# Patient Record
Sex: Male | Born: 1960 | Race: White | Hispanic: No | Marital: Married | Smoking: Former smoker
Health system: Southern US, Community
[De-identification: ages and names within clinical notes are randomized; demographics above are authoritative.]

## PROBLEM LIST (undated history)

## (undated) DIAGNOSIS — G43109 Migraine with aura, not intractable, without status migrainosus: Secondary | ICD-10-CM

## (undated) DIAGNOSIS — R269 Unspecified abnormalities of gait and mobility: Secondary | ICD-10-CM

## (undated) DIAGNOSIS — J301 Allergic rhinitis due to pollen: Secondary | ICD-10-CM

## (undated) DIAGNOSIS — J45909 Unspecified asthma, uncomplicated: Secondary | ICD-10-CM

## (undated) DIAGNOSIS — G4489 Other headache syndrome: Secondary | ICD-10-CM

## (undated) DIAGNOSIS — G35D Multiple sclerosis, unspecified: Secondary | ICD-10-CM

## (undated) DIAGNOSIS — G500 Trigeminal neuralgia: Secondary | ICD-10-CM

## (undated) DIAGNOSIS — F482 Pseudobulbar affect: Secondary | ICD-10-CM

## (undated) DIAGNOSIS — R1310 Dysphagia, unspecified: Secondary | ICD-10-CM

## (undated) DIAGNOSIS — F329 Major depressive disorder, single episode, unspecified: Secondary | ICD-10-CM

## (undated) DIAGNOSIS — G35 Multiple sclerosis: Secondary | ICD-10-CM

## (undated) DIAGNOSIS — F32A Depression, unspecified: Secondary | ICD-10-CM

## (undated) HISTORY — PX: OTHER SURGICAL HISTORY: SHX169

## (undated) HISTORY — DX: Pseudobulbar affect: F48.2

## (undated) HISTORY — DX: Major depressive disorder, single episode, unspecified: F32.9

## (undated) HISTORY — DX: Unspecified abnormalities of gait and mobility: R26.9

## (undated) HISTORY — DX: Dysphagia, unspecified: R13.10

## (undated) HISTORY — DX: Migraine with aura, not intractable, without status migrainosus: G43.109

## (undated) HISTORY — DX: Unspecified asthma, uncomplicated: J45.909

## (undated) HISTORY — DX: Multiple sclerosis, unspecified: G35.D

## (undated) HISTORY — DX: Allergic rhinitis due to pollen: J30.1

## (undated) HISTORY — PX: EYE SURGERY: SHX253

## (undated) HISTORY — DX: Multiple sclerosis: G35

## (undated) HISTORY — DX: Other headache syndrome: G44.89

## (undated) HISTORY — DX: Depression, unspecified: F32.A

---

## 2006-01-10 ENCOUNTER — Encounter: Admission: RE | Admit: 2006-01-10 | Discharge: 2006-01-10 | Payer: Self-pay | Admitting: Internal Medicine

## 2009-05-17 ENCOUNTER — Encounter: Admission: RE | Admit: 2009-05-17 | Discharge: 2009-05-17 | Payer: Self-pay | Admitting: Family Medicine

## 2009-12-10 ENCOUNTER — Encounter: Admission: RE | Admit: 2009-12-10 | Discharge: 2010-03-10 | Payer: Self-pay | Admitting: Neurology

## 2010-04-25 ENCOUNTER — Encounter: Admission: RE | Admit: 2010-04-25 | Discharge: 2010-05-08 | Payer: Self-pay | Admitting: Neurology

## 2011-01-15 ENCOUNTER — Encounter (INDEPENDENT_AMBULATORY_CARE_PROVIDER_SITE_OTHER): Payer: Medicare Other

## 2011-01-15 DIAGNOSIS — G35 Multiple sclerosis: Secondary | ICD-10-CM

## 2011-12-24 DIAGNOSIS — R0989 Other specified symptoms and signs involving the circulatory and respiratory systems: Secondary | ICD-10-CM | POA: Diagnosis not present

## 2011-12-24 DIAGNOSIS — M549 Dorsalgia, unspecified: Secondary | ICD-10-CM | POA: Diagnosis not present

## 2012-01-19 DIAGNOSIS — G35 Multiple sclerosis: Secondary | ICD-10-CM | POA: Diagnosis not present

## 2012-01-19 DIAGNOSIS — S2249XA Multiple fractures of ribs, unspecified side, initial encounter for closed fracture: Secondary | ICD-10-CM | POA: Diagnosis not present

## 2012-02-26 DIAGNOSIS — Z9181 History of falling: Secondary | ICD-10-CM | POA: Diagnosis not present

## 2012-02-26 DIAGNOSIS — G35 Multiple sclerosis: Secondary | ICD-10-CM | POA: Diagnosis not present

## 2012-02-26 DIAGNOSIS — IMO0001 Reserved for inherently not codable concepts without codable children: Secondary | ICD-10-CM | POA: Diagnosis not present

## 2012-02-26 DIAGNOSIS — G8220 Paraplegia, unspecified: Secondary | ICD-10-CM | POA: Diagnosis not present

## 2012-02-26 DIAGNOSIS — R269 Unspecified abnormalities of gait and mobility: Secondary | ICD-10-CM | POA: Diagnosis not present

## 2012-03-01 DIAGNOSIS — G35 Multiple sclerosis: Secondary | ICD-10-CM | POA: Diagnosis not present

## 2012-03-01 DIAGNOSIS — G8220 Paraplegia, unspecified: Secondary | ICD-10-CM | POA: Diagnosis not present

## 2012-03-01 DIAGNOSIS — R269 Unspecified abnormalities of gait and mobility: Secondary | ICD-10-CM | POA: Diagnosis not present

## 2012-03-01 DIAGNOSIS — Z9181 History of falling: Secondary | ICD-10-CM | POA: Diagnosis not present

## 2012-03-01 DIAGNOSIS — IMO0001 Reserved for inherently not codable concepts without codable children: Secondary | ICD-10-CM | POA: Diagnosis not present

## 2012-03-08 DIAGNOSIS — R269 Unspecified abnormalities of gait and mobility: Secondary | ICD-10-CM | POA: Diagnosis not present

## 2012-03-08 DIAGNOSIS — Z9181 History of falling: Secondary | ICD-10-CM | POA: Diagnosis not present

## 2012-03-08 DIAGNOSIS — IMO0001 Reserved for inherently not codable concepts without codable children: Secondary | ICD-10-CM | POA: Diagnosis not present

## 2012-03-08 DIAGNOSIS — G35 Multiple sclerosis: Secondary | ICD-10-CM | POA: Diagnosis not present

## 2012-03-08 DIAGNOSIS — G8220 Paraplegia, unspecified: Secondary | ICD-10-CM | POA: Diagnosis not present

## 2012-03-10 DIAGNOSIS — G8220 Paraplegia, unspecified: Secondary | ICD-10-CM | POA: Diagnosis not present

## 2012-03-10 DIAGNOSIS — G35 Multiple sclerosis: Secondary | ICD-10-CM | POA: Diagnosis not present

## 2012-03-10 DIAGNOSIS — IMO0001 Reserved for inherently not codable concepts without codable children: Secondary | ICD-10-CM | POA: Diagnosis not present

## 2012-03-10 DIAGNOSIS — Z9181 History of falling: Secondary | ICD-10-CM | POA: Diagnosis not present

## 2012-03-10 DIAGNOSIS — R269 Unspecified abnormalities of gait and mobility: Secondary | ICD-10-CM | POA: Diagnosis not present

## 2012-03-17 DIAGNOSIS — R269 Unspecified abnormalities of gait and mobility: Secondary | ICD-10-CM | POA: Diagnosis not present

## 2012-03-17 DIAGNOSIS — G35 Multiple sclerosis: Secondary | ICD-10-CM | POA: Diagnosis not present

## 2012-03-17 DIAGNOSIS — G8220 Paraplegia, unspecified: Secondary | ICD-10-CM | POA: Diagnosis not present

## 2012-03-17 DIAGNOSIS — IMO0001 Reserved for inherently not codable concepts without codable children: Secondary | ICD-10-CM | POA: Diagnosis not present

## 2012-03-17 DIAGNOSIS — Z9181 History of falling: Secondary | ICD-10-CM | POA: Diagnosis not present

## 2012-05-20 DIAGNOSIS — R269 Unspecified abnormalities of gait and mobility: Secondary | ICD-10-CM | POA: Diagnosis not present

## 2012-05-20 DIAGNOSIS — M25519 Pain in unspecified shoulder: Secondary | ICD-10-CM | POA: Diagnosis not present

## 2012-05-20 DIAGNOSIS — G35 Multiple sclerosis: Secondary | ICD-10-CM | POA: Diagnosis not present

## 2012-08-04 ENCOUNTER — Ambulatory Visit (INDEPENDENT_AMBULATORY_CARE_PROVIDER_SITE_OTHER): Payer: Medicare Other | Admitting: Family Medicine

## 2012-08-04 ENCOUNTER — Encounter: Payer: Self-pay | Admitting: Family Medicine

## 2012-08-04 VITALS — BP 98/68 | HR 68 | Temp 98.4°F | Ht 70.0 in | Wt 175.0 lb

## 2012-08-04 DIAGNOSIS — E785 Hyperlipidemia, unspecified: Secondary | ICD-10-CM

## 2012-08-04 DIAGNOSIS — Z131 Encounter for screening for diabetes mellitus: Secondary | ICD-10-CM

## 2012-08-04 DIAGNOSIS — G35C1 Active secondary progressive multiple sclerosis: Secondary | ICD-10-CM | POA: Insufficient documentation

## 2012-08-04 DIAGNOSIS — G35 Multiple sclerosis: Secondary | ICD-10-CM | POA: Insufficient documentation

## 2012-08-04 DIAGNOSIS — R7309 Other abnormal glucose: Secondary | ICD-10-CM | POA: Diagnosis not present

## 2012-08-04 DIAGNOSIS — Z5181 Encounter for therapeutic drug level monitoring: Secondary | ICD-10-CM

## 2012-08-04 DIAGNOSIS — Z13220 Encounter for screening for lipoid disorders: Secondary | ICD-10-CM

## 2012-08-04 DIAGNOSIS — Z130 Encounter for screening for diseases of the blood and blood-forming organs and certain disorders involving the immune mechanism: Secondary | ICD-10-CM

## 2012-08-04 DIAGNOSIS — R748 Abnormal levels of other serum enzymes: Secondary | ICD-10-CM

## 2012-08-04 DIAGNOSIS — R739 Hyperglycemia, unspecified: Secondary | ICD-10-CM

## 2012-08-04 NOTE — Progress Notes (Signed)
Chief Complaint  Patient presents with  . Establish Care    HPI: [name] is here to establish care. Has never had a primary care doctor. Has been followed by a neurologist for this. Has the following concerns today: none   Other Providers: -Dr. Sandria Manly in neurology, followed closely  [facility] eye care  Flu vaccine: -can't remember if had tetanus vaccine -does not get flu vaccine  ROS: See pertinent positives and negatives per HPI.  Past Medical History  Diagnosis Date  . Depression   . Hay fever   . MS (multiple sclerosis)   . Asthma     childhood asthma    Family History  Problem Relation Age of Onset  . Lung cancer      parent  . Uterine cancer      other    History   Social History  . Marital Status: Married    Spouse Name: N/A    Number of Children: N/A  . Years of Education: N/A   Social History Main Topics  . Smoking status: Former Games developer  . Smokeless tobacco: None  . Alcohol Use: Yes     rare   . Drug Use: None  . Sexually Active: None   Other Topics Concern  . None   Social History Narrative  . None    Current outpatient prescriptions:citalopram (CELEXA) 20 MG tablet, Take 20 mg by mouth daily., Disp: , Rfl: ;  Fingolimod HCl (GILENYA) 0.5 MG CAPS, Take by mouth daily., Disp: , Rfl: ;  naproxen sodium (ANAPROX) 220 MG tablet, Take 220 mg by mouth 2 (two) times daily with a meal., Disp: , Rfl: ;  tiZANidine (ZANAFLEX) 4 MG capsule, Take 4 mg by mouth daily., Disp: , Rfl:   EXAM:  Filed Vitals:   08/04/12 1421  BP: 98/68  Pulse: 68  Temp: 98.4 F (36.9 C)    Body mass index is 25.11 kg/(m^2).  GENERAL: vitals reviewed and listed above, alert, oriented, appears well hydrated and in no acute distress  MS: in a wheelchair  PSYCH: pleasant and cooperative, no obvious depression or anxiety  ASSESSMENT AND PLAN:  Discussed the following assessment and plan:  1. MS (multiple sclerosis), followed by Dr. Sandria Manly in neurology    2.  Diabetes mellitus screening    3. Screening for hyperlipidemia  Lipid Panel, Hemoglobin A1c  4. Medication monitoring encounter    5. Screening for other and unspecified deficiency anemia    6. Abnormal liver enzymes    7. Hyperglycemia  Hemoglobin A1c  8. Hyperlipidemia  Lipid Panel   -We reviewed the PMH, PSH, FH, SH, Meds and Allergies. - provided refills for any medications we will prescribe as needed. - addressed current concerns per orders and patient instructions. - have asked for records for pertinent exams, studies, vaccines and notes from previous providers. - have advised patient to follow up per instructions below.  Patient Instructions  -We have ordered labs or studies at this visit. It usually takes 1-2 weeks for results and processing. We will contact you with instructions IF your results are abnormal. Normal results will be released to your [facility] in 1-2 weeks. If you have not heard from [place] or can not find your results in [facility] in 2 weeks please contact our office.  Thank you for enrolling in MyChart. Please follow the instructions below to securely access your online medical record. MyChart allows you to send messages to your doctor, view your test results, renew your  prescriptions, schedule appointments, and more.  How Do I Sign Up? 1. In your Internet browser, go to [email or web address] 2. Click on the New  User? link in the Sign In box.  3. Enter your MyChart Access Code exactly as it appears below. You will not need to use this code after you have completed the sign-up process. If you do not sign up before the expiration date, you must request a new code. MyChart Access Code: [email or web address] Expires: 09/03/2012  2:55 PM  4. Enter the last four digits of your Social Security Number (xxxx) and Date of Birth (mm/dd/yyyy) as indicated and click Next. You will be taken to the next sign-up page. 5. Create a MyChart ID. This will be your MyChart login ID  and cannot be changed, so think of one that is secure and easy to remember. 6. Create a MyChart password. You can change your password at any time. 7. Enter your Password Reset Question and Answer and click Next. This can be used at a later time if you forget your password.  8. Select your communication preference, and if applicable enter your e-mail address. You will receive e-mail notification when new information is available in MyChart by choosing to receive e-mail notifications and filling in your e-mail. 9. Click Sign In. You can now view your medical record.   Additional Information If you have questions, you can email [email or web address] or call [phone] to talk to our MyChart staff. Remember, MyChart is NOT to be used for urgent needs. For medical emergencies, dial 911.            Kriste Basque R.

## 2012-08-04 NOTE — Patient Instructions (Signed)
-  We have ordered labs or studies at this visit. It usually takes 1-2 weeks for results and processing. We will contact you with instructions IF your results are abnormal. Normal results will be released to your [facility] in 1-2 weeks. If you have not heard from [place] or can not find your results in [facility] in 2 weeks please contact our office.  Thank you for enrolling in MyChart. Please follow the instructions below to securely access your online medical record. MyChart allows you to send messages to your doctor, view your test results, renew your prescriptions, schedule appointments, and more.  How Do I Sign Up? 1. In your Internet browser, go to [email or web address] 2. Click on the New  User? link in the Sign In box.  3. Enter your MyChart Access Code exactly as it appears below. You will not need to use this code after you have completed the sign-up process. If you do not sign up before the expiration date, you must request a new code. MyChart Access Code: [email or web address] Expires: 09/03/2012  2:55 PM  4. Enter the last four digits of your Social Security Number (xxxx) and Date of Birth (mm/dd/yyyy) as indicated and click Next. You will be taken to the next sign-up page. 5. Create a MyChart ID. This will be your MyChart login ID and cannot be changed, so think of one that is secure and easy to remember. 6. Create a MyChart password. You can change your password at any time. 7. Enter your Password Reset Question and Answer and click Next. This can be used at a later time if you forget your password.  8. Select your communication preference, and if applicable enter your e-mail address. You will receive e-mail notification when new information is available in MyChart by choosing to receive e-mail notifications and filling in your e-mail. 9. Click Sign In. You can now view your medical record.   Additional Information If you have questions, you can email [email or web address] or call [phone] to talk to our MyChart staff. Remember, MyChart is NOT to be used for urgent needs. For medical emergencies, dial 911.

## 2012-08-05 ENCOUNTER — Telehealth: Payer: Self-pay | Admitting: Family Medicine

## 2012-08-05 LAB — LIPID PANEL
Cholesterol: 267 mg/dL — ABNORMAL HIGH (ref 0–200)
Total CHOL/HDL Ratio: 6
Triglycerides: 171 mg/dL — ABNORMAL HIGH (ref 0.0–149.0)

## 2012-08-05 LAB — LDL CHOLESTEROL, DIRECT: Direct LDL: 192.1 mg/dL

## 2012-08-05 NOTE — Telephone Encounter (Signed)
Please let [name] know - we wanted to contact him about his labs since [facility] not activiated. Diabetes screening test was negative. Cholesterol is very high. Would advised a follow up appointment in a month or two to address the cholesterol and discuss treatment options. If he could come for an early appointment and not eat that day we could check his cholesterol that same day.

## 2012-08-05 NOTE — Telephone Encounter (Signed)
Attempted to call pt and number is busy. Will attempt to call at later time.

## 2012-08-08 NOTE — Telephone Encounter (Signed)
Called and spoke with pt and pt is aware.  

## 2012-09-19 DIAGNOSIS — G5710 Meralgia paresthetica, unspecified lower limb: Secondary | ICD-10-CM | POA: Diagnosis not present

## 2012-09-19 DIAGNOSIS — G35 Multiple sclerosis: Secondary | ICD-10-CM | POA: Diagnosis not present

## 2012-09-19 DIAGNOSIS — M25519 Pain in unspecified shoulder: Secondary | ICD-10-CM | POA: Diagnosis not present

## 2012-09-21 ENCOUNTER — Encounter: Payer: Self-pay | Admitting: Family Medicine

## 2012-09-21 DIAGNOSIS — N319 Neuromuscular dysfunction of bladder, unspecified: Secondary | ICD-10-CM | POA: Insufficient documentation

## 2012-09-21 DIAGNOSIS — G5710 Meralgia paresthetica, unspecified lower limb: Secondary | ICD-10-CM

## 2012-09-21 NOTE — Progress Notes (Signed)
Received and reviewed office note from [facility] Neurology - Dr. Sandria Manly Seen for MS, Meralgia Paresthetica, neurogenic bladder - increased LE weakness. Zanaflex increased to 2mg  tid. Gabapentin 100mg  tid for MP started. Gilenya and celexa continued. I updated the medication and problem list.

## 2012-11-26 ENCOUNTER — Other Ambulatory Visit: Payer: Self-pay

## 2013-01-04 ENCOUNTER — Ambulatory Visit: Payer: Self-pay | Admitting: Neurology

## 2013-01-10 ENCOUNTER — Ambulatory Visit (INDEPENDENT_AMBULATORY_CARE_PROVIDER_SITE_OTHER): Payer: Medicare Other | Admitting: Neurology

## 2013-01-10 ENCOUNTER — Encounter: Payer: Self-pay | Admitting: Neurology

## 2013-01-10 VITALS — BP 103/69 | HR 62 | Ht 70.0 in | Wt 180.0 lb

## 2013-01-10 DIAGNOSIS — G35 Multiple sclerosis: Secondary | ICD-10-CM

## 2013-01-10 DIAGNOSIS — G5710 Meralgia paresthetica, unspecified lower limb: Secondary | ICD-10-CM | POA: Diagnosis not present

## 2013-01-10 DIAGNOSIS — N319 Neuromuscular dysfunction of bladder, unspecified: Secondary | ICD-10-CM

## 2013-01-10 DIAGNOSIS — Z5181 Encounter for therapeutic drug level monitoring: Secondary | ICD-10-CM | POA: Diagnosis not present

## 2013-01-10 DIAGNOSIS — G5712 Meralgia paresthetica, left lower limb: Secondary | ICD-10-CM

## 2013-01-10 NOTE — Progress Notes (Signed)
Reason for visit: Multiple sclerosis  SOSTENES [name] is an 52 y.o. male  History of present illness:  [name] is a 52 year old right-handed white male with a history of multiple sclerosis that was initially diagnosed in 2001. Even at age 73, the patient recalls tingly sensations in the legs after exercise. The patient initially presented with lower extremity weakness and gait imbalance. Over time, the patient has had a progressive paraparesis, and ataxia affecting the upper extremities. The patient is minimally ambulatory at this time, and he can only walk with a walker and with assistance. The patient does have some problems with a neurogenic bowel and bladder. The patient has had falls on occasion, the last fall was one week ago. The patient lives with his wife and 2 daughters. The patient has had a left meralgia paresthetica that has been improved with the use of gabapentin. The patient will occasionally have cramps in the legs, and he uses tizanidine for this which is beneficial. The patient is on Gilenya, and he is tolerating the medication well. Since last seen, the patient is having more ataxia with the use of the left upper extremity. The patient returns to this office for an evaluation. The last MRI of the brain or spinal cord was in 2009. The last blood work was in the summer of 2013. The alkaline phosphatase was slightly elevated at that time.  Past Medical History  Diagnosis Date  . Depression   . Hay fever   . MS (multiple sclerosis)   . Asthma     childhood asthma    Past Surgical History  Procedure Laterality Date  . Eye surgeries      x 2; bilateral 74 and 73    Family History  Problem Relation Age of Onset  . Lung cancer      parent  . Uterine cancer      other  . Cancer Father   . Multiple sclerosis Sister   . Seizures Maternal Uncle   . Multiple sclerosis Sister     Social history:  reports that he has quit smoking. He does not have any smokeless  tobacco history on file. He reports that  drinks alcohol. His drug history is not on file.  Allergies: No Known Allergies  Medications:  Current Outpatient Prescriptions on File Prior to Visit  Medication Sig Dispense Refill  . citalopram (CELEXA) 20 MG tablet Take 20 mg by mouth daily.      . Fingolimod HCl (GILENYA) 0.5 MG CAPS Take by mouth daily.      Marland Kitchen gabapentin (NEURONTIN) 100 MG capsule Take 100 mg by mouth 3 (three) times daily.      . naproxen sodium (ANAPROX) 220 MG tablet Take 220 mg by mouth 2 (two) times daily with a meal.       No current facility-administered medications on file prior to visit.    ROS:  Out of a complete 14 system review of symptoms, the patient complains only of the following symptoms, and all other reviewed systems are negative.  Fatigue Dizziness Incontinence of bowel and bladder, constipation Joint pain, achy muscles Memory loss, confusion, headache, numbness, weakness Depression  Blood pressure 103/69, pulse 62, height 5\' 10"  (1.778 m), weight 180 lb (81.647 kg).  Physical Exam  General: The patient is alert and cooperative at the time of the examination.  Skin: No significant peripheral edema is noted.   Neurologic Exam  Cranial nerves: Facial symmetry is present. Speech is slightly dysarthric, not  aphasic Extraocular movements are full. The patient has a left internuclear ophthalmoplegia. Visual fields are full.  Motor: The patient has good strength in the upper extremities extremities. With the lower extremities, there is diffuse 4/5 strength, with a footdrop seen on the right greater than left foot. Increased motor tone is noted in the legs.  Coordination: The patient has dysmetria with finger-nose-finger bilaterally, left greater than right. The patient is unable to perform heel-to-shin with the lower extremities.  Gait and station: The patient is able to stand up with assistance. The patient was not ambulated. Romberg is  negative.  Reflexes: Deep tendon reflexes are symmetric, but reflexes are brisk in the lower extremities. Sustained ankle clonus is seen bilaterally.   Assessment/Plan:  One. Multiple sclerosis  2. Gait disorder  3. Neurogenic bowel and bladder  The patient is on Gilenya, and we will recheck blood work at this time to follow the liver function tests. The patient may have a secondary progressive course of multiple sclerosis unfortunately. The patient has significant dysmetria of the left greater than right upper extremities, and he may require weights on the wrists to help function of the arms. The patient has significant limitation in his ability to ambulate. The patient is having brief episodes of dizziness associated with chronic use of Celexa. The patient will followup through this office in about 6 months. We will consider MRI evaluation at that time of the brain and cervical spinal cord.  [name] MD 01/10/2013 11:36 AM  [facility] [address] [place], [place] [address]  Phone [phone] Fax [phone]

## 2013-01-11 ENCOUNTER — Telehealth: Payer: Self-pay | Admitting: Neurology

## 2013-01-11 ENCOUNTER — Ambulatory Visit: Payer: Self-pay | Admitting: Neurology

## 2013-01-11 LAB — CBC WITH DIFFERENTIAL/PLATELET
Basophils Absolute: 0 10*3/uL (ref 0.0–0.2)
Basos: 1 % (ref 0–3)
Eos: 1 % (ref 0–5)
Hemoglobin: 14.4 g/dL (ref 12.6–17.7)
MCH: 30.3 pg (ref 26.6–33.0)
MCHC: 34.1 g/dL (ref 31.5–35.7)
MCV: 89 fL (ref 79–97)
Monocytes Absolute: 0.4 10*3/uL (ref 0.1–0.9)
Neutrophils Absolute: 2.3 10*3/uL (ref 1.4–7.0)
Neutrophils Relative %: 72 % (ref 40–74)
RBC: 4.75 x10E6/uL (ref 4.14–5.80)
RDW: 13.9 % (ref 12.3–15.4)

## 2013-01-11 LAB — COMPREHENSIVE METABOLIC PANEL
ALT: 50 IU/L — ABNORMAL HIGH (ref 0–44)
Albumin: 4.5 g/dL (ref 3.5–5.5)
BUN: 15 mg/dL (ref 6–24)
Calcium: 9.4 mg/dL (ref 8.7–10.2)
Creatinine, Ser: 0.84 mg/dL (ref 0.76–1.27)
Globulin, Total: 2.1 g/dL (ref 1.5–4.5)
Glucose: 79 mg/dL (ref 65–99)
Potassium: 4.5 mmol/L (ref 3.5–5.2)
Sodium: 144 mmol/L (ref 134–144)

## 2013-01-11 NOTE — Telephone Encounter (Signed)
I called patient. The blood work reveals elevations in the upper velocities at 155, and the ALT at 50. The phosphatase was minimally elevated in the summer of 2013. I will call the patient about 3 months to recheck the liver enzymes white blood count was 3.1, which is expected on Gilenya. I discussed this with patient.

## 2013-02-07 ENCOUNTER — Encounter: Payer: Self-pay | Admitting: Neurology

## 2013-04-04 ENCOUNTER — Telehealth: Payer: Self-pay | Admitting: Neurology

## 2013-04-06 DIAGNOSIS — Z0289 Encounter for other administrative examinations: Secondary | ICD-10-CM

## 2013-04-12 ENCOUNTER — Telehealth: Payer: Self-pay | Admitting: Neurology

## 2013-04-12 DIAGNOSIS — Z5181 Encounter for therapeutic drug level monitoring: Secondary | ICD-10-CM

## 2013-04-12 NOTE — Telephone Encounter (Signed)
Message copied by Stephanie Acre on Wed Apr 12, 2013  7:30 AM ------      Message from: Stephanie Acre      Created: Wed Jan 11, 2013  7:58 AM       Recheck liver enzymes. The patient is on Gilenya. ------

## 2013-04-12 NOTE — Telephone Encounter (Signed)
I called the patient. He is to come in for blood work to recheck the liver function tests, the patient is on Gilenya.

## 2013-04-13 ENCOUNTER — Other Ambulatory Visit: Payer: Self-pay | Admitting: Neurology

## 2013-04-13 ENCOUNTER — Telehealth: Payer: Self-pay | Admitting: Neurology

## 2013-04-13 DIAGNOSIS — Z5181 Encounter for therapeutic drug level monitoring: Secondary | ICD-10-CM | POA: Diagnosis not present

## 2013-04-13 LAB — COMPREHENSIVE METABOLIC PANEL
ALT: 58 IU/L — ABNORMAL HIGH (ref 0–44)
Albumin/Globulin Ratio: 2.7 — ABNORMAL HIGH (ref 1.1–2.5)
BUN/Creatinine Ratio: 16 (ref 9–20)
Chloride: 104 mmol/L (ref 96–108)
Creatinine, Ser: 0.79 mg/dL (ref 0.76–1.27)
GFR calc Af Amer: 122 mL/min/{1.73_m2} (ref >59)
GFR calc non Af Amer: 105 mL/min/{1.73_m2} (ref >59)
Globulin, Total: 1.8 g/dL (ref 1.5–4.5)
Sodium: 140 mmol/L (ref 134–144)
Total Bilirubin: 0.6 mg/dL (ref 0.0–1.2)

## 2013-04-13 NOTE — Telephone Encounter (Signed)
I called the patient. The blood work revealed a stable mild elevation in the alkaline phosphatase and ALT compared to 3 months ago.

## 2013-05-05 ENCOUNTER — Telehealth: Payer: Self-pay

## 2013-05-05 NOTE — Telephone Encounter (Signed)
I called and left VM that forms have been completed and signed. They will be faxed now and a copy with confirmation will be sent out to patient tonight.

## 2013-05-17 ENCOUNTER — Other Ambulatory Visit: Payer: Self-pay

## 2013-05-17 MED ORDER — CITALOPRAM HYDROBROMIDE 20 MG PO TABS: 20.0000 mg | ORAL_TABLET | Freq: Every day | ORAL | Status: DC

## 2013-07-12 ENCOUNTER — Ambulatory Visit (INDEPENDENT_AMBULATORY_CARE_PROVIDER_SITE_OTHER): Payer: Medicare Other | Admitting: Neurology

## 2013-07-12 ENCOUNTER — Encounter: Payer: Self-pay | Admitting: Neurology

## 2013-07-12 VITALS — BP 111/71 | HR 65 | Wt 183.0 lb

## 2013-07-12 DIAGNOSIS — N319 Neuromuscular dysfunction of bladder, unspecified: Secondary | ICD-10-CM | POA: Diagnosis not present

## 2013-07-12 DIAGNOSIS — G35 Multiple sclerosis: Secondary | ICD-10-CM | POA: Diagnosis not present

## 2013-07-12 NOTE — Progress Notes (Signed)
Reason for visit: Multiple sclerosis  [name] is an 52 y.o. male  History of present illness:   [name] is a 52 year old right-handed white male with a history of multiple sclerosis associated with significant ataxia of the left greater than right upper extremity, and a gait disorder with spasticity of the lower extremities. The patient is on Gilenya, and he is tolerating the medication well. The patient has had episodes of squeezing, uncomfortable sensations involving the left arm that will come and go, oftentimes lasting about 3 days before resolution. The patient also has occasional stinging sensations in the left thigh that will improve with the use of low-dose gabapentin. The patient has also noted intermittent twitching of the muscles of the lower face on the left, associated with a "thumping" sensation in the left ear. The twitching in the face muscles and a thumping sensations are in phase. The patient is essentially nonambulatory, he can stand for transfers. The patient has chronic problems with constipation, and he uses enemas on average 3 times a week. The patient returns for an evaluation.  Past Medical History  Diagnosis Date  . Depression   . Hay fever   . MS (multiple sclerosis)   . Asthma     childhood asthma    Past Surgical History  Procedure Laterality Date  . Eye surgeries      x 2; bilateral 74 and 34    Family History  Problem Relation Age of Onset  . Lung cancer      parent  . Uterine cancer      other  . Cancer Father   . Multiple sclerosis Sister   . Seizures Maternal Uncle   . Parkinsonism Maternal Uncle   . Multiple sclerosis Sister   . Multiple sclerosis Paternal Uncle   . Multiple sclerosis Other     Social history:  reports that he has quit smoking. He has never used smokeless tobacco. He reports that  drinks alcohol. He reports that he does not use illicit drugs.   No Known Allergies  Medications:  Current Outpatient  Prescriptions on File Prior to Visit  Medication Sig Dispense Refill  . citalopram (CELEXA) 20 MG tablet Take 1 tablet (20 mg total) by mouth daily.  90 tablet  1  . Fingolimod HCl (GILENYA) 0.5 MG CAPS Take by mouth daily.      Marland Kitchen gabapentin (NEURONTIN) 100 MG capsule Take 100 mg by mouth 3 (three) times daily.      . naproxen sodium (ANAPROX) 220 MG tablet Take 220 mg by mouth 2 (two) times daily with a meal.      . tiZANidine (ZANAFLEX) 2 MG tablet Take 2 mg by mouth 3 (three) times daily as needed.       No current facility-administered medications on file prior to visit.    ROS:  Out of a complete 14 system review of symptoms, the patient complains only of the following symptoms, and all other reviewed systems are negative.  Fatigue Eye pain Incontinence of bowel and bladder Impotence Chronic constipation Achy muscles Confusion, headache, numbness, weakness, dizziness Depression, decreased energy  Blood pressure 111/71, pulse 65, weight 183 lb (83.008 kg).  Physical Exam  General: The patient is alert and cooperative at the time of the examination.  Skin: No significant peripheral edema is noted.   Neurologic Exam  Cranial nerves: Facial symmetry is present. Speech is notable for a cerebellar type speech pattern. Extraocular movements are full. The patient has a  left INO, with lateral nystagmus of the right eye with lateral gaze. Visual fields are full.  Motor: The patient has good strength in the upper extremities. The patient has 4/5 strength of the lower extremities, left leg is slightly weaker than the right. Increased motor tone is noted in the lower extremities.  Coordination: The patient has dysmetria with both upper extremities with finger-nose-finger, much worse on the left than the right. The patient not able to perform heel-to-shin on either side very well.  Gait and station: The patient is nonambulatory, wheelchair-bound.  Reflexes: Deep tendon reflexes are  symmetric, but are brisk in the legs..   Assessment/Plan:  1. Multiple sclerosis  2. Gait disorder  The patient reports some left facial fasciculations associated with a "thumping" sensation of the left ear likely associated with involvement of the left facial nerve centrally. The patient is having some discomfort in the left arm, left leg, and he may need to go on gabapentin and tizanidine on a regular basis. He is currently taking these medications if needed. The patient will be given a trial Linzess for his chronic constipation. If this is effective, he will contact our office. The patient will continue the Gilenya, and a repeat MRI of the brain will be done at this time. The patient will followup in 6 months.  Mark Palau MD 07/12/2013 4:59 PM  [facility] [address] [place], [place] [address]  Phone [phone] Fax [phone]

## 2013-07-24 ENCOUNTER — Ambulatory Visit
Admission: RE | Admit: 2013-07-24 | Discharge: 2013-07-24 | Disposition: A | Discharge status: Home / Self Care | Payer: Medicare Other | Source: Ambulatory Visit | Attending: Neurology | Admitting: Neurology

## 2013-07-24 ENCOUNTER — Telehealth: Payer: Self-pay | Admitting: Neurology

## 2013-07-24 DIAGNOSIS — N319 Neuromuscular dysfunction of bladder, unspecified: Secondary | ICD-10-CM

## 2013-07-24 DIAGNOSIS — G35 Multiple sclerosis: Secondary | ICD-10-CM

## 2013-07-24 MED ORDER — GADOBENATE DIMEGLUMINE 529 MG/ML IV SOLN
17.0000 mL | Freq: Once | INTRAVENOUS | Status: AC | PRN
  Administered 2013-07-24: 17 mL via INTRAVENOUS

## 2013-07-24 MED ORDER — LINACLOTIDE 290 MCG PO CAPS: 290.0000 ug | ORAL_CAPSULE | Freq: Every day | ORAL | Status: DC

## 2013-07-24 NOTE — Telephone Encounter (Signed)
I called patient. The MRI study the brain shows some progression of white matter changes, not severe. The patient does have some progression of atrophy as well suggesting axonal loss, chronic disease. I discussed this with the patient. He will remain on Gilenya. The patient has found that the Linzess is effective for his constipation. I will call in a prescription.

## 2013-08-17 ENCOUNTER — Other Ambulatory Visit: Payer: Self-pay

## 2013-09-12 ENCOUNTER — Telehealth: Payer: Self-pay | Admitting: Neurology

## 2013-09-12 NOTE — Telephone Encounter (Signed)
I called patient. The patient is trying to get some sort of connection of the diagnosis of MS with a service connection through the [facility]. The patient was initially evaluated in 2001 by Dr. Sandria Manly, and a MRI of some sort was done, probably the brain. A definite diagnosis was not made until 2006. The patient presented with left-sided numbness. The patient likely had MS at that time, I have dictated a letter to this regard. I'll try to get the paper medical records and review the studies that were done in 2001. I'll need to dictate another letter more than likely.

## 2013-09-12 NOTE — Telephone Encounter (Signed)
Prior Dr. Sandria Manly patient, please advise.

## 2013-09-15 ENCOUNTER — Encounter: Payer: Self-pay | Admitting: Neurology

## 2013-09-15 ENCOUNTER — Telehealth: Payer: Self-pay | Admitting: *Deleted

## 2013-09-15 NOTE — Telephone Encounter (Signed)
INFORMED PATIENT THAT LETTER IS AVAILABLE FOR P/U. HIS WIFE WILL PICK IT UP FOR HIM TODAY.

## 2013-11-02 ENCOUNTER — Other Ambulatory Visit: Payer: Self-pay

## 2013-11-02 ENCOUNTER — Encounter: Payer: Self-pay | Admitting: Family Medicine

## 2013-11-02 ENCOUNTER — Ambulatory Visit (INDEPENDENT_AMBULATORY_CARE_PROVIDER_SITE_OTHER): Payer: Medicare Other | Admitting: Family Medicine

## 2013-11-02 VITALS — BP 120/80 | Temp 97.8°F | Wt 185.0 lb

## 2013-11-02 DIAGNOSIS — N319 Neuromuscular dysfunction of bladder, unspecified: Secondary | ICD-10-CM

## 2013-11-02 DIAGNOSIS — G35 Multiple sclerosis: Secondary | ICD-10-CM | POA: Diagnosis not present

## 2013-11-02 DIAGNOSIS — R300 Dysuria: Secondary | ICD-10-CM | POA: Diagnosis not present

## 2013-11-02 LAB — POCT URINALYSIS DIPSTICK
Blood, UA: NEGATIVE
Glucose, UA: NEGATIVE
LEUKOCYTES UA: NEGATIVE
Nitrite, UA: NEGATIVE
Protein, UA: NEGATIVE
SPEC GRAV UA: 1.025
Urobilinogen, UA: 4
pH, UA: 5.5

## 2013-11-02 MED ORDER — FINGOLIMOD HCL 0.5 MG PO CAPS: 0.5000 mg | ORAL_CAPSULE | Freq: Every day | ORAL | Status: DC

## 2013-11-02 NOTE — Progress Notes (Signed)
Pre visit review using our clinic review tool, if applicable. No additional management support is needed unless otherwise documented below in the visit note. 

## 2013-11-02 NOTE — Addendum Note (Signed)
Addended by: Serena ColonelHRASHER, Alanni Vader R on: 11/02/2013 08:43 AM   Modules accepted: Orders

## 2013-11-02 NOTE — Progress Notes (Signed)
Chief Complaint  Patient presents with  . Abdominal Pain    HPI:  Acute visit for Dysuria: -chronic suprapupic discomfort prior to voiding related to neurogenic bladder, but a little worse the last 1 week and wanted to check for infection -lower abd/bladder spasm discomfort right before voiding, urgency, hesitancy -denies:fevers, vomiting, change in bowels, hematuria, incontinence, flank pain  ROS: See pertinent positives and negatives per HPI.  Past Medical History  Diagnosis Date  . Depression   . Hay fever   . MS (multiple sclerosis)   . Asthma     childhood asthma    Past Surgical History  Procedure Laterality Date  . Eye surgeries      x 2; bilateral 74 and 58    Family History  Problem Relation Age of Onset  . Lung cancer      parent  . Uterine cancer      other  . Cancer Father   . Multiple sclerosis Sister   . Seizures Maternal Uncle   . Parkinsonism Maternal Uncle   . Multiple sclerosis Sister   . Multiple sclerosis Paternal Uncle   . Multiple sclerosis Other     History   Social History  . Marital Status: Married    Spouse Name: N/A    Number of Children: N/A  . Years of Education: N/A   Social History Main Topics  . Smoking status: Former Games developer  . Smokeless tobacco: Never Used  . Alcohol Use: Yes     Comment: rare   . Drug Use: No  . Sexual Activity: None   Other Topics Concern  . None   Social History Narrative  . None    Current outpatient prescriptions:citalopram (CELEXA) 20 MG tablet, Take 1 tablet (20 mg total) by mouth daily., Disp: 90 tablet, Rfl: 1;  Fingolimod HCl (GILENYA) 0.5 MG CAPS, Take by mouth daily., Disp: , Rfl: ;  gabapentin (NEURONTIN) 100 MG capsule, Take 100 mg by mouth 3 (three) times daily., Disp: , Rfl: ;  Linaclotide (LINZESS) 290 MCG CAPS capsule, Take 1 capsule (290 mcg total) by mouth daily., Disp: 30 capsule, Rfl: 5 naproxen sodium (ANAPROX) 220 MG tablet, Take 220 mg by mouth 2 (two) times daily with a  meal., Disp: , Rfl: ;  tiZANidine (ZANAFLEX) 2 MG tablet, Take 2 mg by mouth 3 (three) times daily as needed., Disp: , Rfl:   EXAM:  Filed Vitals:   11/02/13 0815  BP: 120/80  Temp: 97.8 F (36.6 C)    Body mass index is 26.54 kg/(m^2).  GENERAL: vitals reviewed and listed above, alert, oriented, appears well hydrated and in no acute distress  HEENT: atraumatic, conjunttiva clear, no obvious abnormalities on inspection of external nose and ears  NECK: no obvious masses on inspection  LUNGS: clear to auscultation bilaterally, no wheezes, rales or rhonchi, good air movement  CV: HRRR, no peripheral edema  ABD: BS+, soft, NTTP, no CVA TTP  MS: moves all extremities without noticeable abnormality  PSYCH: pleasant and cooperative, no obvious depression or anxiety  ASSESSMENT AND PLAN:  Discussed the following assessment and plan:  Neurogenic bladder  MS (multiple sclerosis), followed by Dr. Sandria Manly in neurology  Dysuria - Plan: Culture, Urine  -we discussed possible serious and likely etiologies, workup and treatment, treatment risks and return precautions -after this discussion, Pierre opted for urine culture/dip today to exclude acute symptomatic infection with tx with cipro if needed. If no infection will see urologist, also considering seeing urologist regardless given  chronic voiding issues and reports was supposed to do this 3 years ago. He will let us know if desires this referral. -follow up advised as needed -of course, we advised Kanen  to return or notify a doctor immediately if symptoms worsen or persist or new concerns arise.  -Patient advised to return or notify a doctor immediately if symptoms worsen or persist or new concerns arise.  There are no Patient Instructions on file for this visit.   Kriste BasqueKIM, HANNAH R.

## 2013-11-04 LAB — URINE CULTURE
COLONY COUNT: NO GROWTH
ORGANISM ID, BACTERIA: NO GROWTH

## 2013-11-06 NOTE — Addendum Note (Signed)
Addended by: Azucena Freed on: 11/06/2013 11:38 AM   Modules accepted: Orders

## 2013-11-10 ENCOUNTER — Other Ambulatory Visit: Payer: Self-pay | Admitting: Neurology

## 2013-11-14 ENCOUNTER — Other Ambulatory Visit: Payer: Self-pay

## 2013-11-14 MED ORDER — TIZANIDINE HCL 2 MG PO TABS: 2.0000 mg | ORAL_TABLET | Freq: Three times a day (TID) | ORAL | Status: DC | PRN

## 2013-11-15 ENCOUNTER — Telehealth: Payer: Self-pay | Admitting: Family Medicine

## 2013-11-15 ENCOUNTER — Encounter: Payer: Self-pay | Admitting: Family Medicine

## 2013-11-15 ENCOUNTER — Telehealth: Payer: Self-pay | Admitting: Neurology

## 2013-11-15 ENCOUNTER — Ambulatory Visit (INDEPENDENT_AMBULATORY_CARE_PROVIDER_SITE_OTHER): Payer: Medicare Other | Admitting: Family Medicine

## 2013-11-15 VITALS — BP 110/80 | Temp 98.3°F

## 2013-11-15 DIAGNOSIS — B029 Zoster without complications: Secondary | ICD-10-CM | POA: Diagnosis not present

## 2013-11-15 MED ORDER — VALACYCLOVIR HCL 1 G PO TABS
1000.0000 mg | ORAL_TABLET | Freq: Three times a day (TID) | ORAL | Status: DC
Start: 2013-11-15 — End: 2014-01-16

## 2013-11-15 NOTE — Telephone Encounter (Signed)
PT THINKS HE HAS SHINGLES--SHOULD HE DISCONTINUE GILENYA

## 2013-11-15 NOTE — Telephone Encounter (Signed)
Patient Information:  Caller Name: Tallyn  Phone: [phone]  Patient: [name], [name]  Gender: Male  DOB: 01-23-1961  Age: 53 Years  PCP: Selena Batten (TEXT 1st, after 20 mins can call), Dahlia Client [facility])  Office Follow Up:  Does the office need to follow up with this patient?: No  Instructions For The Office: N/A   Symptoms  Reason For Call & Symptoms: 11/08/13 pt has MS (confined to wheelchair), pt took a shower and afterwards became nauseous and lightheaded, felt like he was going to pass out.  Episode lasted about 10 minutes.    Has continued to be nauseous since, continuous headache, chills at times.  11/12/13 rash appeared on back.  11/14/13 rash became painful.    Pt has called his neurologist due to one of side effects of his MS medication is that it can cause shingles.   Pt believes the rash is most likely Shingles.  11/15/13 rash extrends from center of back to left side, was two bands but now one large band, red bumps, appears like small blisters forming.  Taking Aleve for pain.  Reviewed Health History In EMR: Yes  Reviewed Medications In EMR: Yes  Reviewed Allergies In EMR: Yes  Reviewed Surgeries / Procedures: Yes  Date of Onset of Symptoms: 11/12/2013  Treatments Tried: Aleve  Treatments Tried Worked: No  Guideline(s) Used:  Rash or Redness - Localized  Disposition Per Guideline:   See Today in Office  Reason For Disposition Reached:   Painful rash and has multiple small blisters grouped together in one area of body (i.e., dermatomal distribution or "band" or "stripe")  Advice Given:  N/A  Patient Will Follow Care Advice:  YES  Appointment Scheduled:  11/15/2013 14:00:00 Appointment Scheduled Provider:  Selena Batten (TEXT 1st, after 20 mins can call), Dahlia Client [facility])

## 2013-11-15 NOTE — Telephone Encounter (Signed)
I called patient. The patient again having a thoracic shingles outbreak within the last 4 days. The patient is on Gilenya. I have asked her to stop the medication for one week, and get started back on it again. The patient is on Valtrex and prednisone. If the rash of significant worsens, he is to contact me.

## 2013-11-15 NOTE — Progress Notes (Signed)
Chief Complaint  Patient presents with  . Rash    on back    HPI:  Acute visit for Rash: -on left side of back - painful rash -had some nausea and headache last week - not today - but has headaches like this all the time with his MS so this is not different and he did notify his neurologist -reports his neurologist wanted him to call their office if it was shingles for instructions regarding his MS medication -denies: fevers,other symptoms today and feels his normal baseline today, he does report his MS has been worse recently   ROS: See pertinent positives and negatives per HPI.  Past Medical History  Diagnosis Date  . Depression   . Hay fever   . MS (multiple sclerosis)   . Asthma     childhood asthma    Past Surgical History  Procedure Laterality Date  . Eye surgeries      x 2; bilateral 74 and 589    Family History  Problem Relation Age of Onset  . Lung cancer      parent  . Uterine cancer      other  . Cancer Father   . Multiple sclerosis Sister   . Seizures Maternal Uncle   . Parkinsonism Maternal Uncle   . Multiple sclerosis Sister   . Multiple sclerosis Paternal Uncle   . Multiple sclerosis Other     History   Social History  . Marital Status: Married    Spouse Name: N/A    Number of Children: N/A  . Years of Education: N/A   Social History Main Topics  . Smoking status: Former Games developermoker  . Smokeless tobacco: Never Used  . Alcohol Use: Yes     Comment: rare   . Drug Use: No  . Sexual Activity: None   Other Topics Concern  . None   Social History Narrative  . None    Current outpatient prescriptions:citalopram (CELEXA) 20 MG tablet, TAKE 1 TABLET BY MOUTH EVERY DAY, Disp: 90 tablet, Rfl: 1;  Fingolimod HCl (GILENYA) 0.5 MG CAPS, Take 1 capsule (0.5 mg total) by mouth daily., Disp: 30 capsule, Rfl: 3;  gabapentin (NEURONTIN) 100 MG capsule, Take 100 mg by mouth 3 (three) times daily., Disp: , Rfl:  Linaclotide (LINZESS) 290 MCG CAPS capsule,  Take 1 capsule (290 mcg total) by mouth daily., Disp: 30 capsule, Rfl: 5;  naproxen sodium (ANAPROX) 220 MG tablet, Take 220 mg by mouth 2 (two) times daily with a meal., Disp: , Rfl: ;  tiZANidine (ZANAFLEX) 2 MG tablet, Take 1 tablet (2 mg total) by mouth 3 (three) times daily as needed., Disp: 90 tablet, Rfl: 6 valACYclovir (VALTREX) 1000 MG tablet, Take 1 tablet (1,000 mg total) by mouth 3 (three) times daily., Disp: 21 tablet, Rfl: 0  EXAM:  Filed Vitals:   11/15/13 1359  BP: 110/80  Temp: 98.3 F (36.8 C)    Body mass index is 0.00 kg/(m^2).  GENERAL: vitals reviewed and listed above, alert, oriented, appears well hydrated and in no acute distress  HEENT: atraumatic, conjunttiva clear, no obvious abnormalities on inspection of external nose and ears  SKIN: shingles rash on L side  CV: HRRR, no peripheral edema  MS: moves all extremities without noticeable abnormality  PSYCH: pleasant and cooperative, no obvious depression or anxiety  ASSESSMENT AND PLAN:  Discussed the following assessment and plan:  Shingles - Plan: valACYclovir (VALTREX) 1000 MG tablet  -discussed risks -advised to notify his neurologist -  Patient advised to return or notify a doctor immediately if symptoms worsen or persist or new concerns arise.  Patient Instructions  -call your neurologist about the shingles and the other issues  -take the valtrex as prescribed starting today  Shingles Shingles (herpes zoster) is an infection that is caused by the same virus that causes chickenpox (varicella). The infection causes a painful skin rash and fluid-filled blisters, which eventually break open, crust over, and heal. It may occur in any area of the body, but it usually affects only one side of the body or face. The pain of shingles usually lasts about 1 month. However, some people with shingles may develop long-term (chronic) pain in the affected area of the body. Shingles often occurs many years after  the person had chickenpox. It is more common:  In people older than 50 years.  In people with weakened immune systems, such as those with HIV, AIDS, or cancer.  In people taking medicines that weaken the immune system, such as transplant medicines.  In people under great stress. CAUSES  Shingles is caused by the varicella zoster virus (VZV), which also causes chickenpox. After a person is infected with the virus, it can remain in the person's body for years in an inactive state (dormant). To cause shingles, the virus reactivates and breaks out as an infection in a nerve root. The virus can be spread from person to person (contagious) through contact with open blisters of the shingles rash. It will only spread to people who have not had chickenpox. When these people are exposed to the virus, they may develop chickenpox. They will not develop shingles. Once the blisters scab over, the person is no longer contagious and cannot spread the virus to others. SYMPTOMS  Shingles shows up in stages. The initial symptoms may be pain, itching, and tingling in an area of the skin. This pain is usually described as burning, stabbing, or throbbing.In a few days or weeks, a painful red rash will appear in the area where the pain, itching, and tingling were felt. The rash is usually on one side of the body in a band or belt-like pattern. Then, the rash usually turns into fluid-filled blisters. They will scab over and dry up in approximately 2 3 weeks. Flu-like symptoms may also occur with the initial symptoms, the rash, or the blisters. These may include:  Fever.  Chills.  Headache.  Upset stomach. DIAGNOSIS  Your caregiver will perform a skin exam to diagnose shingles. Skin scrapings or fluid samples may also be taken from the blisters. This sample will be examined under a microscope or sent to a lab for further testing. TREATMENT  There is no specific cure for shingles. Your caregiver will likely  prescribe medicines to help you manage the pain, recover faster, and avoid long-term problems. This may include antiviral drugs, anti-inflammatory drugs, and pain medicines. HOME CARE INSTRUCTIONS   Take a cool bath or apply cool compresses to the area of the rash or blisters as directed. This may help with the pain and itching.   Only take over-the-counter or prescription medicines as directed by your caregiver.   Rest as directed by your caregiver.  Keep your rash and blisters clean with mild soap and cool water or as directed by your caregiver.  Do not pick your blisters or scratch your rash. Apply an anti-itch cream or numbing creams to the affected area as directed by your caregiver.  Keep your shingles rash covered with a loose bandage (  dressing).  Avoid skin contact with:  Babies.   Pregnant women.   Children with eczema.   Elderly people with transplants.   People with chronic illnesses, such as leukemia or AIDS.   Wear loose-fitting clothing to help ease the pain of material rubbing against the rash.  Keep all follow-up appointments with your caregiver.If the area involved is on your face, you may receive a referral for follow-up to a specialist, such as an eye doctor (ophthalmologist) or an ear, nose, and throat (ENT) doctor. Keeping all follow-up appointments will help you avoid eye complications, chronic pain, or disability.  SEEK IMMEDIATE MEDICAL CARE IF:   You have facial pain, pain around the eye area, or loss of feeling on one side of your face.  You have ear pain or ringing in your ear.  You have loss of taste.  Your pain is not relieved with prescribed medicines.   Your redness or swelling spreads.   You have more pain and swelling.  Your condition is worsening or has changed.   You have a feveror persistent symptoms for more than 2 3 days.  You have a fever and your symptoms suddenly get worse. MAKE SURE YOU:  Understand these  instructions.  Will watch your condition.  Will get help right away if you are not doing well or get worse. Document Released: 09/28/2005 Document Revised: 06/22/2012 Document Reviewed: 05/12/2012 [facility] Patient Information 2014 [place], [place]ona Kettle, Dahlia Client R.

## 2013-11-15 NOTE — Telephone Encounter (Signed)
Patient said that she has noticeable painful rash, has lasted 3 days,has gotten bigger and she has headaches. It was itchy first but now is painful. One of the Gilenya side effects are shingles, should she discontinue?

## 2013-11-15 NOTE — Patient Instructions (Addendum)
-call your neurologist about the shingles and the other issues  -take the valtrex as prescribed starting today  Shingles Shingles (herpes zoster) is an infection that is caused by the same virus that causes chickenpox (varicella). The infection causes a painful skin rash and fluid-filled blisters, which eventually break open, crust over, and heal. It may occur in any area of the body, but it usually affects only one side of the body or face. The pain of shingles usually lasts about 1 month. However, some people with shingles may develop long-term (chronic) pain in the affected area of the body. Shingles often occurs many years after the person had chickenpox. It is more common:  In people older than 50 years.  In people with weakened immune systems, such as those with HIV, AIDS, or cancer.  In people taking medicines that weaken the immune system, such as transplant medicines.  In people under great stress. CAUSES  Shingles is caused by the varicella zoster virus (VZV), which also causes chickenpox. After a person is infected with the virus, it can remain in the person's body for years in an inactive state (dormant). To cause shingles, the virus reactivates and breaks out as an infection in a nerve root. The virus can be spread from person to person (contagious) through contact with open blisters of the shingles rash. It will only spread to people who have not had chickenpox. When these people are exposed to the virus, they may develop chickenpox. They will not develop shingles. Once the blisters scab over, the person is no longer contagious and cannot spread the virus to others. SYMPTOMS  Shingles shows up in stages. The initial symptoms may be pain, itching, and tingling in an area of the skin. This pain is usually described as burning, stabbing, or throbbing.In a few days or weeks, a painful red rash will appear in the area where the pain, itching, and tingling were felt. The rash is usually  on one side of the body in a band or belt-like pattern. Then, the rash usually turns into fluid-filled blisters. They will scab over and dry up in approximately 2 3 weeks. Flu-like symptoms may also occur with the initial symptoms, the rash, or the blisters. These may include:  Fever.  Chills.  Headache.  Upset stomach. DIAGNOSIS  Your caregiver will perform a skin exam to diagnose shingles. Skin scrapings or fluid samples may also be taken from the blisters. This sample will be examined under a microscope or sent to a lab for further testing. TREATMENT  There is no specific cure for shingles. Your caregiver will likely prescribe medicines to help you manage the pain, recover faster, and avoid long-term problems. This may include antiviral drugs, anti-inflammatory drugs, and pain medicines. HOME CARE INSTRUCTIONS   Take a cool bath or apply cool compresses to the area of the rash or blisters as directed. This may help with the pain and itching.   Only take over-the-counter or prescription medicines as directed by your caregiver.   Rest as directed by your caregiver.  Keep your rash and blisters clean with mild soap and cool water or as directed by your caregiver.  Do not pick your blisters or scratch your rash. Apply an anti-itch cream or numbing creams to the affected area as directed by your caregiver.  Keep your shingles rash covered with a loose bandage (dressing).  Avoid skin contact with:  Babies.   Pregnant women.   Children with eczema.   Elderly people with transplants.  People with chronic illnesses, such as leukemia or AIDS.   Wear loose-fitting clothing to help ease the pain of material rubbing against the rash.  Keep all follow-up appointments with your caregiver.If the area involved is on your face, you may receive a referral for follow-up to a specialist, such as an eye doctor (ophthalmologist) or an ear, nose, and throat (ENT) doctor. Keeping all  follow-up appointments will help you avoid eye complications, chronic pain, or disability.  SEEK IMMEDIATE MEDICAL CARE IF:   You have facial pain, pain around the eye area, or loss of feeling on one side of your face.  You have ear pain or ringing in your ear.  You have loss of taste.  Your pain is not relieved with prescribed medicines.   Your redness or swelling spreads.   You have more pain and swelling.  Your condition is worsening or has changed.   You have a feveror persistent symptoms for more than 2 3 days.  You have a fever and your symptoms suddenly get worse. MAKE SURE YOU:  Understand these instructions.  Will watch your condition.  Will get help right away if you are not doing well or get worse. Document Released: 09/28/2005 Document Revised: 06/22/2012 Document Reviewed: 05/12/2012 [facility] Patient Information 2014 [place], [place].

## 2013-11-15 NOTE — Progress Notes (Signed)
Pre visit review using our clinic review tool, if applicable. No additional management support is needed unless otherwise documented below in the visit note. 

## 2013-11-21 ENCOUNTER — Telehealth: Payer: Self-pay | Admitting: Neurology

## 2013-11-21 ENCOUNTER — Telehealth: Payer: Self-pay | Admitting: Family Medicine

## 2013-11-21 DIAGNOSIS — Z5181 Encounter for therapeutic drug level monitoring: Secondary | ICD-10-CM

## 2013-11-21 NOTE — Telephone Encounter (Signed)
The antiviral does not cure the shingles, it usually helps decreased the length of the shingles but folks still often have the shingles for several weeks or more. 7 days of treatment is the usual treatment regardless, but he should notify his neurologist to see if in his case anything different is recommended.

## 2013-11-21 NOTE — Telephone Encounter (Signed)
I called patient. The patient is still having some issues with the shingles rash. The patient has also had what sound like slight increase in spasticity associated with a shingles. This may be related to the inflammatory stress of the shingles and the MS. The patient may delay going back on Gilenya for 10 days, and then go on Gilenya 1 tablet every other day for 2 weeks, and then get back on a daily. I'll have the patient come in for a CBC.

## 2013-11-21 NOTE — Telephone Encounter (Signed)
Patient Information:  Caller Name: [name]  Phone: [phone]  Patient: [name], [name]  Gender: Male  DOB: [birth year]  Age: 53 Years  PCP: Selena BattenKim (TEXT 1st, after 20 mins can call), [facility]  Office Follow Up:  Does the office need to follow up with this patient?: No  Instructions For The Office: N/A  RN Note:  Gave information about shingles, Symptoms, treatments from [facility].  Patient verbalizes understanding.  Symptoms  Reason For Call & Symptoms: Has MS.  Had chills then discomfort on Left side of body in belt like area from mid back around side to mid abdomen.  Was Dx with shingles, has been on antiviral 6 days and is not cured.  Rash band with red blisters/bumps now.  Had to stop Rx for MS while on anti-viral.   Asking what to expect.  Reviewed Health History In EMR: Yes  Reviewed Medications In EMR: Yes  Reviewed Allergies In EMR: Yes  Reviewed Surgeries / Procedures: Yes  Date of Onset of Symptoms: 11/08/2013  Guideline(s) Used:  No Protocol Available - Sick Adult  Disposition Per Guideline:   Home Care  Reason For Disposition Reached:   Patient's symptoms are safe to treat at home per nursing judgment  Advice Given:  N/A  Patient Will Follow Care Advice:  YES

## 2013-11-21 NOTE — Telephone Encounter (Signed)
A week ago he was taken off Gilenya because he had the shingles. He doesn't feel the shingles are leaving fast enough and would like to know should he remain off Gilenya for another week or go ahead and have it. Please.call

## 2013-11-22 ENCOUNTER — Other Ambulatory Visit (INDEPENDENT_AMBULATORY_CARE_PROVIDER_SITE_OTHER): Payer: Self-pay

## 2013-11-22 DIAGNOSIS — Z0289 Encounter for other administrative examinations: Secondary | ICD-10-CM

## 2013-11-22 DIAGNOSIS — Z5181 Encounter for therapeutic drug level monitoring: Secondary | ICD-10-CM | POA: Diagnosis not present

## 2013-11-22 LAB — CBC WITH DIFFERENTIAL
BASOS ABS: 0 10*3/uL (ref 0.0–0.2)
Basos: 1 %
EOS ABS: 0 10*3/uL (ref 0.0–0.4)
Eos: 1 %
HCT: 42.1 % (ref 37.5–51.0)
Hemoglobin: 14.7 g/dL (ref 12.6–17.7)
LYMPHS ABS: 0.5 10*3/uL — AB (ref 0.7–3.1)
Lymphs: 18 %
MCH: 30.8 pg (ref 26.6–33.0)
MCHC: 34.9 g/dL (ref 31.5–35.7)
MCV: 88 fL (ref 79–97)
Monocytes Absolute: 0.3 10*3/uL (ref 0.1–0.9)
Monocytes: 12 %
Neutrophils Absolute: 2 10*3/uL (ref 1.4–7.0)
Neutrophils Relative %: 68 %
Platelets: 259 10*3/uL (ref 150–379)
RBC: 4.78 x10E6/uL (ref 4.14–5.80)
RDW: 13.8 % (ref 12.3–15.4)
WBC: 2.9 10*3/uL — AB (ref 3.4–10.8)

## 2013-11-22 NOTE — Telephone Encounter (Signed)
Called and spoke with pt and pt is aware of Dr. Kim's recommendations.  

## 2013-11-23 NOTE — Progress Notes (Signed)
Quick Note:  Called patient with lab results, patient expressed understanding ______

## 2014-01-16 ENCOUNTER — Ambulatory Visit (INDEPENDENT_AMBULATORY_CARE_PROVIDER_SITE_OTHER): Payer: Medicare Other | Admitting: Neurology

## 2014-01-16 ENCOUNTER — Encounter: Payer: Self-pay | Admitting: Neurology

## 2014-01-16 VITALS — BP 99/64 | HR 64

## 2014-01-16 DIAGNOSIS — Z5181 Encounter for therapeutic drug level monitoring: Secondary | ICD-10-CM | POA: Diagnosis not present

## 2014-01-16 DIAGNOSIS — G35 Multiple sclerosis: Secondary | ICD-10-CM

## 2014-01-16 DIAGNOSIS — N319 Neuromuscular dysfunction of bladder, unspecified: Secondary | ICD-10-CM

## 2014-01-16 LAB — CBC WITH DIFFERENTIAL
Basophils Absolute: 0 10*3/uL (ref 0.0–0.2)
Basos: 0 %
Eos: 1 %
Eosinophils Absolute: 0 10*3/uL (ref 0.0–0.4)
HCT: 42.6 % (ref 37.5–51.0)
Hemoglobin: 15.1 g/dL (ref 12.6–17.7)
Lymphocytes Absolute: 0.3 10*3/uL — ABNORMAL LOW (ref 0.7–3.1)
Lymphs: 8 %
MCH: 31.5 pg (ref 26.6–33.0)
MCHC: 35.4 g/dL (ref 31.5–35.7)
MCV: 89 fL (ref 79–97)
Monocytes Absolute: 0.4 10*3/uL (ref 0.1–0.9)
Monocytes: 11 %
Neutrophils Absolute: 2.8 10*3/uL (ref 1.4–7.0)
Neutrophils Relative %: 80 %
Platelets: 273 10*3/uL (ref 150–379)
RBC: 4.8 x10E6/uL (ref 4.14–5.80)
RDW: 13.8 % (ref 12.3–15.4)
WBC: 3.5 10*3/uL (ref 3.4–10.8)

## 2014-01-16 LAB — COMPREHENSIVE METABOLIC PANEL
ALK PHOS: 148 IU/L — AB (ref 39–117)
ALT: 45 IU/L — AB (ref 0–44)
AST: 37 IU/L (ref 0–40)
Albumin/Globulin Ratio: 2.3 (ref 1.1–2.5)
Albumin: 4.6 g/dL (ref 3.5–5.5)
BILIRUBIN TOTAL: 0.6 mg/dL (ref 0.0–1.2)
BUN / CREAT RATIO: 21 — AB (ref 9–20)
BUN: 17 mg/dL (ref 6–24)
CALCIUM: 9.8 mg/dL (ref 8.7–10.2)
CHLORIDE: 106 mmol/L (ref 96–108)
CO2: 27 mmol/L (ref 18–29)
Creatinine, Ser: 0.81 mg/dL (ref 0.76–1.27)
GFR, EST AFRICAN AMERICAN: 120 mL/min/{1.73_m2} (ref >59)
GFR, EST NON AFRICAN AMERICAN: 104 mL/min/{1.73_m2} (ref >59)
GLOBULIN, TOTAL: 2 g/dL (ref 1.5–4.5)
Glucose: 95 mg/dL (ref 65–99)
POTASSIUM: 4.6 mmol/L (ref 3.5–5.2)
Sodium: 143 mmol/L (ref 134–144)
Total Protein: 6.6 g/dL (ref 6.0–8.5)

## 2014-01-16 NOTE — Patient Instructions (Signed)
Multiple Sclerosis  Multiple sclerosis (MS) is a disease of the central nervous system. It leads to loss of the insulating covering of the nerves (myelin sheath) of your brain. When this happens, brain signals do not get transmitted properly or may not get transmitted at all. The symptoms of MS occur in episodes or attacks. These attacks may last weeks to months. There may be long periods of nearly no problems between attacks. The age of onset of MS varies.   CAUSES  The cause of MS is unknown. However, it is more common in the [place] than in the southern [place].  RISK FACTORS  There is a higher incidence of MS in women than in men. MS is not an inherited illness, although your risk of MS is higher if you have a relative with MS.  SIGNS AND SYMPTOMS   The symptoms of MS occur in episodes or attacks. These attacks may last weeks to months. There may be long periods of almost no symptoms between attacks.  The symptoms of MS vary. This is because of the many different ways it affects the central nervous system. The main symptoms of MS include:   Vision problems and eye pain.   Numbness.   Weakness.   Paralysis in your arms, hands, feet, and legs (extremities).   Balance problems.   Tremors.  DIAGNOSIS   Your health care provider can diagnose MS with the help of imaging exams and lab tests. These may include specialized X-ray exams and spinal fluid tests. The best imaging exam to confirm a diagnosis of MS is MRI.  TREATMENT   There is no known cure for MS, but there are medicines that can decrease the number and frequency of attacks. Steroids are often used for short-term relief. Physical and occupational therapy may also help.  HOME CARE INSTRUCTIONS    Take medicines as directed by your health care provider.   Exercise as directed by your health care provider.  SEEK MEDICAL CARE IF:  You begin to feel depressed.  SEEK IMMEDIATE MEDICAL CARE IF:   You develop paralysis.   You develop  problems with bladder, bowel, or sexual function.   You develop mental changes, such as forgetfulness or mood swings.   You have a seizure.  Document Released: 09/25/2000 Document Revised: 07/19/2013 Document Reviewed: 06/05/2013  ExitCare Patient Information 2014 [employer].

## 2014-01-16 NOTE — Progress Notes (Signed)
Reason for visit: Multiple sclerosis  [name] is an 53 y.o. male  History of present illness:  [name] is a 53 year old right-handed white male with a history of multiple sclerosis. The patient currently is on Gilenya which he is tolerating well. The patient however, had an episode of shingles on the left T8 distribution in February 2015. The patient initially had some discomfort, but this has improved. The patient has had an increase in spasticity involving the back, abdomen, and the legs since that time that is gradually improving, but not back to baseline. The patient also reports that he has had some decrease in coordination of the upper extremities to include the right arm. The patient has a squeezing sensation of both arms at this time. The patient denies any change in bowel or bladder function. The patient takes tizanidine, but he only takes 2 mg at night. The patient tolerates the medication well. The patient had a CBC done around the time that shows a white count of 2.9, lymphocyte count of 0.5. The patient returns to the office today for an evaluation. The Linzess has helped his bowel significantly.  Past Medical History  Diagnosis Date  . Depression   . Hay fever   . MS (multiple sclerosis)   . Asthma     childhood asthma    Past Surgical History  Procedure Laterality Date  . Eye surgeries      x 2; bilateral 74 and 25    Family History  Problem Relation Age of Onset  . Lung cancer      parent  . Uterine cancer      other  . Cancer Father   . Multiple sclerosis Sister   . Seizures Maternal Uncle   . Parkinsonism Maternal Uncle   . Multiple sclerosis Sister   . Multiple sclerosis Paternal Uncle   . Multiple sclerosis Other     Social history:  reports that he has quit smoking. He has never used smokeless tobacco. He reports that he drinks alcohol. He reports that he does not use illicit drugs.   No Known Allergies  Medications:  Current  Outpatient Prescriptions on File Prior to Visit  Medication Sig Dispense Refill  . citalopram (CELEXA) 20 MG tablet TAKE 1 TABLET BY MOUTH EVERY DAY  90 tablet  1  . Fingolimod HCl (GILENYA) 0.5 MG CAPS Take 1 capsule (0.5 mg total) by mouth daily.  30 capsule  3  . gabapentin (NEURONTIN) 100 MG capsule Take 100 mg by mouth 3 (three) times daily.      . Linaclotide (LINZESS) 290 MCG CAPS capsule Take 1 capsule (290 mcg total) by mouth daily.  30 capsule  5  . naproxen sodium (ANAPROX) 220 MG tablet Take 220 mg by mouth 2 (two) times daily with a meal.      . tiZANidine (ZANAFLEX) 2 MG tablet Take 1 tablet (2 mg total) by mouth 3 (three) times daily as needed.  90 tablet  6   No current facility-administered medications on file prior to visit.    ROS:  Out of a complete 14 system review of symptoms, the patient complains only of the following symptoms, and all other reviewed systems are negative.  Fatigue Neck stiffness Eye pain, right Heat intolerance Difficulty urinating Memory loss, dizziness, headache, numbness, weakness, tremors, passing out  Blood pressure 99/64, pulse 64, height 0' (0 m), weight 0 lb (0 kg).  Physical Exam  General: The patient is alert and  cooperative at the time of the examination.  Skin: No significant peripheral edema is noted.   Neurologic Exam  Mental status: The patient is oriented x 3.  Cranial nerves: Facial symmetry is present. Speech is abnormal, with an ataxic quality. Extraocular movements are full, but there is prominent end gaze nystagmus bilaterally. Visual fields are full.  Motor: The patient has good strength in the upper extremities. With the lower extremities, there is increased motor tone, right greater than left. The patient has 4 minus/5 strength throughout the left leg, 3/5 strength on the right leg.  Sensory examination: Soft touch sensation is symmetric on the face, decreased on the left arm and leg relative to the  right.  Coordination: The patient has significant dysmetria with finger-nose-finger bilaterally, left greater right. The patient is unable to perform heel-to-shin on either side.  Gait and station: The patient is nonambulatory, wheelchair-bound.  Reflexes: Deep tendon reflexes are symmetric, but there is ankle clonus on the left, not present on the right.   Assessment/Plan:  One. Multiple sclerosis  2. Spastic paraparesis  3. Recent shingles outbreak  The patient will remain on Gilenya at this time. Blood work will be done today. The patient will followup in 6 months. The patient is having some increased spasticity involving the legs, right greater left. The patient will go up on the tizanidine taking 2 mg 3 times daily.  Mark Palau. Keith Jimie Kuwahara MD 01/16/2014 9:19 PM  [facility] [address] [place], [place] [address]  Phone [phone] Fax [phone]

## 2014-02-07 ENCOUNTER — Telehealth: Payer: Self-pay

## 2014-02-07 MED ORDER — GABAPENTIN 100 MG PO CAPS: 100.0000 mg | ORAL_CAPSULE | Freq: Three times a day (TID) | ORAL | Status: DC

## 2014-02-07 NOTE — Telephone Encounter (Signed)
I will call in a prescription for gabapentin.

## 2014-02-07 NOTE — Telephone Encounter (Signed)
[facility] sent [place] a request saying the patient would like to get a Rx for Gabapentin 100mg  one capsule three times daily.  Last prescribed by Dr Sandria Manly in 2013.  Okay to fill?  Please advise.  Thank you.

## 2014-02-13 ENCOUNTER — Telehealth: Payer: Self-pay | Admitting: Neurology

## 2014-02-13 NOTE — Telephone Encounter (Signed)
Patient calling to state that he believes he needs to start steroid medication. Please call and advise patient.   

## 2014-02-14 MED ORDER — PREDNISONE 10 MG PO TABS: ORAL_TABLET | ORAL | Status: DC

## 2014-02-14 NOTE — Telephone Encounter (Signed)
Patient calling to state that he believes he needs to start steroid medication. Please call and advise patient.

## 2014-02-14 NOTE — Telephone Encounter (Signed)
I called patient. Over the last week or 10 days, he has had some increasing problems with weakness of the legs and weakness and clumsiness of the arms. He has some tingly sensations in the right shoulder down to the fingertips on the right hand. He has increased problems with sensation of choking with swallowing. There have been no other problems with fevers, cough, or evidence of a bladder infection. I will call in a prednisone Dosepak for him. He will call us if he continues to worsen.

## 2014-02-27 ENCOUNTER — Other Ambulatory Visit: Payer: Self-pay | Admitting: Neurology

## 2014-03-16 ENCOUNTER — Other Ambulatory Visit: Payer: Self-pay

## 2014-03-16 MED ORDER — FINGOLIMOD HCL 0.5 MG PO CAPS: ORAL_CAPSULE | ORAL | Status: DC

## 2014-04-10 ENCOUNTER — Telehealth: Payer: Self-pay | Admitting: Neurology

## 2014-04-10 NOTE — Telephone Encounter (Signed)
We already sent the refill to [facility] under Dr Anne HahnWillis on 06/05.  I called back.  Spoke with Diane.  She said they have the Rx on file, and she does not know why they called us.  Nothing further is needed at this time.

## 2014-04-10 NOTE — Telephone Encounter (Signed)
Please advise 

## 2014-04-10 NOTE — Telephone Encounter (Signed)
[facility] calling to request that Dr. Anne Hahn sign off on the Gilenya prescription since Dr. Sandria Manly has retired. Please return call and advise.

## 2014-04-18 ENCOUNTER — Other Ambulatory Visit: Payer: Self-pay | Admitting: Neurology

## 2014-06-04 ENCOUNTER — Other Ambulatory Visit: Payer: Self-pay | Admitting: Neurology

## 2014-07-18 ENCOUNTER — Encounter: Payer: Self-pay | Admitting: Neurology

## 2014-07-18 ENCOUNTER — Ambulatory Visit (INDEPENDENT_AMBULATORY_CARE_PROVIDER_SITE_OTHER): Payer: Medicare Other | Admitting: Neurology

## 2014-07-18 VITALS — BP 109/81 | HR 61

## 2014-07-18 DIAGNOSIS — R1314 Dysphagia, pharyngoesophageal phase: Secondary | ICD-10-CM | POA: Diagnosis not present

## 2014-07-18 DIAGNOSIS — G35 Multiple sclerosis: Secondary | ICD-10-CM | POA: Diagnosis not present

## 2014-07-18 DIAGNOSIS — R269 Unspecified abnormalities of gait and mobility: Secondary | ICD-10-CM | POA: Diagnosis not present

## 2014-07-18 DIAGNOSIS — N319 Neuromuscular dysfunction of bladder, unspecified: Secondary | ICD-10-CM | POA: Diagnosis not present

## 2014-07-18 MED ORDER — CITALOPRAM HYDROBROMIDE 40 MG PO TABS: 40.0000 mg | ORAL_TABLET | Freq: Every day | ORAL | Status: DC

## 2014-07-18 NOTE — Progress Notes (Signed)
Reason for visit: Multiple sclerosis  [name] is an 53 y.o. male  History of present illness:  [name] is a 53 year old right-handed white male with a history of multiple sclerosis. The patient is on Gilenya, and he is otherwise tolerating the medication well. Over the last one month, however, he has had some issues with swallowing. The patient notes that he is choking on liquids frequently, on a daily basis. The voice will have a wet quality to it after he eats, and he will have difficulty breathing, with wheezing following a meal. The patient also has noted some worsening of function of the right arm, with increased weakness, and problems with coordination. 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 The patient indicates that his bowel and bladder function is stable, but he does have chronic issues with neurogenic bladder and chronic constipation. The patient has fallen on 2 occasions since last seen. The patient does not ambulate effectively, but he can stand for transfers. He is having ongoing issues with spasticity in the legs, and he indicates that the tizanidine does help some. The patient recently has had some frequent episodes of sensation of impending doom, racing thoughts that generally occur in the morning. The patient will frequently have the feeling that someone is going to hurt him, or that people are watching him. The patient denies any changes in his vision, or new numbness of the extremities. The patient is having ongoing problems with a squeezing sensation of the legs and arms. He returns for an evaluation. He is now followed through the [facility], with service connection.  Past Medical History  Diagnosis Date  . Depression   . Hay fever   . MS (multiple sclerosis)   . Asthma     childhood asthma  . Dysphagia     Past  Surgical History  Procedure Laterality Date  . Eye surgeries      x 2; bilateral 74 and 36    Family History  Problem Relation Age of Onset  . Lung cancer      parent  . Uterine cancer      other  . Cancer Father   . Multiple sclerosis Sister   . Seizures Maternal Uncle   . Parkinsonism Maternal Uncle   . Multiple sclerosis Sister   . Multiple sclerosis Paternal Uncle   . Multiple sclerosis Other     Social history:  reports that he has quit smoking. He has never used smokeless tobacco. He reports that he drinks alcohol. He reports that he does not use illicit drugs.   No Known Allergies  Medications:  Current Outpatient Prescriptions on File Prior to Visit  Medication Sig Dispense Refill  . Fingolimod HCl (GILENYA) 0.5 MG CAPS Take 1 capsule (0.5mg ) by mouth once daily  30 capsule  6  . LINZESS 290 MCG CAPS capsule TAKE ONE CAPSULE BY MOUTH EVERY DAY  30 capsule  5  . naproxen sodium (ANAPROX) 220 MG tablet Take 220 mg by mouth 2 (two) times daily with a meal.      . tiZANidine (ZANAFLEX) 2 MG tablet Take 1 tablet (2 mg total) by mouth 3 (three) times daily as needed.  90 tablet  6   No current facility-administered medications on file prior to visit.    ROS:  Out of a complete 14 system review of symptoms, the  patient complains only of the following symptoms, and all other reviewed systems are negative.  Fatigue Difficulty swallowing, drooling Eye pain, blurred vision Choking Heat intolerance Constipation Difficulty urinating Joint pain, joint swelling, back pain, achy muscles, muscle cramps, walking difficulties, neck pain, neck stiffness Memory loss, dizziness, headache, numbness, speech difficulty, weakness Depression, racing thoughts  Blood pressure 109/81, pulse 61, height 0' (0 m), weight 0 lb (0 kg).  Physical Exam  General: The patient is alert and cooperative at the time of the examination.  Skin: 1+ edema of the lower extremities is  noted.   Neurologic Exam  Mental status: The patient is oriented x 3.  Cranial nerves: Facial symmetry is present. Speech is ataxic, slightly dysarthric. Extraocular movements are full, but there is an gaze nystagmus bilaterally. There appears to be a left INO. Visual fields are full. Pupils are equal, round, and reactive to light. Discs are flat bilaterally.  Motor: The patient has good strength in the left upper extremity. The patient has 4/5 strength with grip on the right hand, 4+/5 strength proximally in the right arm. The patient has 4 minus/5 strength in the right leg, 4/5 strength in the left leg area the patient has a right-sided footdrop. Increased motor tone is noted on all 4 extremities, particularly with the lower extremities.  Sensory examination: Soft touch sensation is decreased on the right leg as compared to the left, symmetric in the arms and face.  Coordination: The patient has dysmetria on all 4 extremities, more common with the left upper extremity.  Gait and station: The patient is not ambulatory, wheelchair-bound.  Reflexes: Deep tendon reflexes are symmetric.   Assessment/Plan:  One. Multiple sclerosis  2. Gait disorder  3. Neurogenic bowel and bladder  4. Reports of dysphagia  5. Reports of obsessive thoughts  The patient will be increased on the Celexa to 40 mg daily. He will be referred for a modified barium swallow to evaluate the dysphagia. The patient will be sent for blood work today, and given the history of progressive symptoms, he will have another MRI of the brain to compare to the study done one year ago. The patient will followup in 4 months. A prescription was given for motorized wheelchair. The patient is finding it difficult to handle a standard wheelchair at this point secondary to the weakness of the upper extremities, particular on the right side, and problems with adhesive capsulitis of the shoulder.  [name]. Keith Willis MD 07/18/2014 9:35  AM  [facility] [address] [place], [place] [address]  Phone [phone] Fax [phone]

## 2014-07-18 NOTE — Patient Instructions (Signed)

## 2014-07-19 LAB — CBC WITH DIFFERENTIAL
BASOS ABS: 0 10*3/uL (ref 0.0–0.2)
Basos: 0 %
EOS ABS: 0 10*3/uL (ref 0.0–0.4)
Eos: 1 %
HEMATOCRIT: 45.1 % (ref 37.5–51.0)
Hemoglobin: 15.4 g/dL (ref 12.6–17.7)
IMMATURE GRANULOCYTES: 0 %
Immature Grans (Abs): 0 10*3/uL (ref 0.0–0.1)
LYMPHS ABS: 0.3 10*3/uL — AB (ref 0.7–3.1)
LYMPHS: 12 %
MCH: 31.1 pg (ref 26.6–33.0)
MCHC: 34.1 g/dL (ref 31.5–35.7)
MCV: 91 fL (ref 79–97)
Monocytes Absolute: 0.5 10*3/uL (ref 0.1–0.9)
Monocytes: 16 %
NEUTROS ABS: 2.1 10*3/uL (ref 1.4–7.0)
Neutrophils Relative %: 71 %
PLATELETS: 268 10*3/uL (ref 150–379)
RBC: 4.95 x10E6/uL (ref 4.14–5.80)
RDW: 14 % (ref 12.3–15.4)
WBC: 3 10*3/uL — ABNORMAL LOW (ref 3.4–10.8)

## 2014-07-19 LAB — COMPREHENSIVE METABOLIC PANEL
A/G RATIO: 2.4 (ref 1.1–2.5)
ALK PHOS: 126 IU/L — AB (ref 39–117)
ALT: 32 IU/L (ref 0–44)
AST: 25 IU/L (ref 0–40)
Albumin: 4.8 g/dL (ref 3.5–5.5)
BILIRUBIN TOTAL: 0.8 mg/dL (ref 0.0–1.2)
BUN / CREAT RATIO: 12 (ref 9–20)
BUN: 11 mg/dL (ref 6–24)
CHLORIDE: 99 mmol/L (ref 97–108)
CO2: 26 mmol/L (ref 18–29)
Calcium: 9.7 mg/dL (ref 8.7–10.2)
Creatinine, Ser: 0.93 mg/dL (ref 0.76–1.27)
GFR, EST AFRICAN AMERICAN: 109 mL/min/{1.73_m2} (ref >59)
GFR, EST NON AFRICAN AMERICAN: 95 mL/min/{1.73_m2} (ref >59)
GLUCOSE: 89 mg/dL (ref 65–99)
Globulin, Total: 2 g/dL (ref 1.5–4.5)
POTASSIUM: 4.5 mmol/L (ref 3.5–5.2)
Sodium: 141 mmol/L (ref 134–144)
TOTAL PROTEIN: 6.8 g/dL (ref 6.0–8.5)

## 2014-07-19 LAB — VARICELLA ZOSTER ANTIBODY, IGG: Varicella zoster IgG: >4000 index (ref >165)

## 2014-07-31 ENCOUNTER — Other Ambulatory Visit (HOSPITAL_COMMUNITY): Payer: Self-pay | Admitting: Neurology

## 2014-07-31 DIAGNOSIS — R1314 Dysphagia, pharyngoesophageal phase: Secondary | ICD-10-CM

## 2014-08-01 ENCOUNTER — Ambulatory Visit
Admission: RE | Admit: 2014-08-01 | Discharge: 2014-08-01 | Disposition: A | Discharge status: Home / Self Care | Payer: Medicare Other | Source: Ambulatory Visit | Attending: Neurology | Admitting: Neurology

## 2014-08-01 ENCOUNTER — Telehealth: Payer: Self-pay | Admitting: Neurology

## 2014-08-01 DIAGNOSIS — G35D Multiple sclerosis, unspecified: Secondary | ICD-10-CM

## 2014-08-01 DIAGNOSIS — G35 Multiple sclerosis: Secondary | ICD-10-CM | POA: Diagnosis not present

## 2014-08-01 DIAGNOSIS — N319 Neuromuscular dysfunction of bladder, unspecified: Secondary | ICD-10-CM

## 2014-08-01 DIAGNOSIS — R269 Unspecified abnormalities of gait and mobility: Secondary | ICD-10-CM

## 2014-08-01 DIAGNOSIS — G9389 Other specified disorders of brain: Secondary | ICD-10-CM | POA: Diagnosis not present

## 2014-08-01 MED ORDER — GADOBENATE DIMEGLUMINE 529 MG/ML IV SOLN
17.0000 mL | Freq: Once | INTRAVENOUS | Status: AC | PRN
Start: 2014-08-01 — End: 2014-08-01
  Administered 2014-08-01: 17 mL via INTRAVENOUS

## 2014-08-01 NOTE — Telephone Encounter (Signed)
  I called patient. MRI the brain is abnormal, but no change from one year ago. I discussed this with patient. He will be going for a swallowing study within the next several days.  MRI brain 08/01/2014:  Impression   Abnormal MRI brain (with and without) demonstrating: 1. Multiple periventricular, subcortical, pontine and cerebellar chronic  demyelinating plaques. No acute plaques. 2. No significant change from MRI on 07/24/13.

## 2014-08-03 ENCOUNTER — Other Ambulatory Visit (HOSPITAL_COMMUNITY): Payer: Medicare Other

## 2014-08-03 ENCOUNTER — Ambulatory Visit (HOSPITAL_COMMUNITY)
Admission: RE | Admit: 2014-08-03 | Discharge: 2014-08-03 | Disposition: A | Discharge status: Home / Self Care | Payer: Medicare Other | Source: Ambulatory Visit | Attending: Neurology | Admitting: Neurology

## 2014-08-03 ENCOUNTER — Ambulatory Visit (HOSPITAL_COMMUNITY): Payer: Medicare Other

## 2014-08-03 DIAGNOSIS — R1310 Dysphagia, unspecified: Secondary | ICD-10-CM | POA: Insufficient documentation

## 2014-08-03 DIAGNOSIS — R0989 Other specified symptoms and signs involving the circulatory and respiratory systems: Secondary | ICD-10-CM | POA: Insufficient documentation

## 2014-08-03 DIAGNOSIS — G35 Multiple sclerosis: Secondary | ICD-10-CM | POA: Diagnosis not present

## 2014-08-03 DIAGNOSIS — R05 Cough: Secondary | ICD-10-CM | POA: Diagnosis not present

## 2014-08-03 DIAGNOSIS — G35D Multiple sclerosis, unspecified: Secondary | ICD-10-CM

## 2014-08-03 DIAGNOSIS — F329 Major depressive disorder, single episode, unspecified: Secondary | ICD-10-CM | POA: Insufficient documentation

## 2014-08-03 DIAGNOSIS — R1314 Dysphagia, pharyngoesophageal phase: Secondary | ICD-10-CM | POA: Diagnosis not present

## 2014-08-03 DIAGNOSIS — R062 Wheezing: Secondary | ICD-10-CM | POA: Insufficient documentation

## 2014-08-03 DIAGNOSIS — N319 Neuromuscular dysfunction of bladder, unspecified: Secondary | ICD-10-CM

## 2014-08-03 DIAGNOSIS — R269 Unspecified abnormalities of gait and mobility: Secondary | ICD-10-CM

## 2014-08-03 NOTE — Procedures (Signed)
Objective Swallowing Evaluation: Modified Barium Swallowing Study  Patient Details  Name: Philipp Deputydam F Buttram MRN: [id number] Date of Birth: 1961-08-04  Today's Date: 08/03/2014 Time: [id number] SLP Time Calculation (min): 30 min  Past Medical History:  Past Medical History  Diagnosis Date  . Depression   . Hay fever   . MS (multiple sclerosis)   . Asthma     childhood asthma  . Dysphagia    Past Surgical History:  Past Surgical History  Procedure Laterality Date  . Eye surgeries      x 2; bilateral 74 and 5274   HPI:  53 year old right-handed white male with a history of multiple sclerosis.  The pt [facility]  Per Dr. Anne HahnWillis' notes from office visit on 07/18/14, the patient is on Gilenya, and he is otherwise tolerating the medication well. Over the last one month, however, he has had some issues with swallowing. The patient notes that he is choking on liquids frequently, on a daily basis. The voice will have a wet quality to it after he eats, and he will have difficulty breathing, with wheezing following a meal. The patient also has noted some worsening of function of the right arm, with increased weakness, and problems with coordination.  Recent MRI 08/01/14: "Multiple periventricular, subcortical, pontine and cerebellar chronic demyelinating plaques. No acute plaques.2. No significant change from MRI on 07/24/13." Pt describes increased drooling, and increased deliberation to swallow during the course of a meal.  He describes loss of breath support by the end of a phrase, so that his voice becomes breathy/whispered.      Assessment / Plan / Recommendation Clinical Impression  Dysphagia Diagnosis: Within Functional Limits Clinical impression: Pt presents with a functional, but mildly delayed oropharyngeal swallow with mildly prolonged mastication and mildly delayed pharyngeal phase.  However, there is sufficient pharyngeal  clearance of all POs and adequate airway protection.  Pt describes symptoms of cough, wet phonation, and vocal/respiration changes by the end of  a meal.  Given the brevity of today's study, there was insufficient time to capture the impact of fatigue upon swallow function.  But because his symptoms of dysphagia are prevalent after every meal, and because these symptoms interfere with comfort and participation, recommend a trial of OP SLP to address respiratory/pharyngeal timing and strengthening.  Pt and his wife agree.      Treatment Recommendation  Defer treatment plan to SLP at (Comment)    Diet Recommendation Regular;Thin liquid   Liquid Administration via: Cup;Straw Medication Administration: Whole meds with liquid Supervision: Patient able to self feed Compensations:  (six smaller meals/day (pt eats only one large meal a day)) Postural Changes and/or Swallow Maneuvers: Seated upright 90 degrees    Other  Recommendations Recommended Consults:  (OPSLP) Oral Care Recommendations: Oral care BID   Follow Up Recommendations  Outpatient SLP        General HType of Study: Modified Barium Swallowing Study Reason for Referral: Objectively evaluate swallowing function Previous Swallow Assessment: none per records Diet Prior to this Study: Regular;Thin liquids Temperature Spikes Noted: No Respiratory Status: Room air History of Recent Intubation: No Behavior/Cognition: Alert;Cooperative Oral Cavity - Dentition: Adequate natural dentition Oral Motor / Sensory Function:  (questional mild deficits CN V motor) Self-Feeding Abilities: Able to feed self Patient Positioning: Upright in chair Baseline Vocal Quality: Clear (mildly harsh quality; high pitch) Volitional Cough: Strong Volitional Swallow: Able to elicit Anatomy: Within functional limits Pharyngeal Secretions: Not observed  secondary MBS    Reason for Referral Objectively evaluate swallowing function   Oral Phase Oral  Preparation/Oral Phase Oral Phase: WFL (mildly prolonged mastication)   Pharyngeal Phase Pharyngeal Phase Pharyngeal Phase: Within functional limits (delayed timing of pharyngeal phase, but functional)  Cervical Esophageal Phase   Elton Heid L. [place], [place] CCC/SLP Pager [phone]     Cervical Esophageal Phase Cervical Esophageal Phase: [facility]    Functional Assessment Tool Used: clinical judgement Functional Limitations: Swallowing Swallow Current Status ([id number]): At least 1 percent but less than 20 percent impaired, limited or restricted Swallow Goal Status [phone]): At least 1 percent but less than 20 percent impaired, limited or restricted Swallow Discharge Status [phone]): At least 1 percent but less than 20 percent impaired, limited or restricted    Blenda Mounts Laurice 08/03/2014, 12:16 PM

## 2014-08-04 ENCOUNTER — Telehealth: Payer: Self-pay | Admitting: Neurology

## 2014-08-04 NOTE — Telephone Encounter (Signed)
I called the patient. The ST eval in the hospital showed no overt aspiration, but they did recommend outpatient ST. I will get this set up if he is amenable to this.

## 2014-08-15 ENCOUNTER — Other Ambulatory Visit: Payer: Self-pay

## 2014-08-15 MED ORDER — CITALOPRAM HYDROBROMIDE 40 MG PO TABS: 40.0000 mg | ORAL_TABLET | Freq: Every day | ORAL | Status: DC

## 2014-08-15 NOTE — Telephone Encounter (Signed)
Pharmacy requests 90 day Rx  

## 2014-09-27 LAB — HM COLONOSCOPY: HM Colonoscopy: NORMAL

## 2014-10-02 ENCOUNTER — Other Ambulatory Visit: Payer: Self-pay | Admitting: Neurology

## 2014-10-29 ENCOUNTER — Telehealth: Payer: Self-pay | Admitting: Neurology

## 2014-10-29 NOTE — Telephone Encounter (Signed)
Patient is calling because he needs a form "Physical Medical service for Functional Evaluation" that he need signed so he can send to [place].Marland Kitchen Form states he is in wheelchair and will from now on be in a wheelchair.  They need so they can start with prostetic work for patient.  Patient's wife will drop form by.  Please call.

## 2014-10-29 NOTE — Telephone Encounter (Signed)
Spoke to patient and he relayed that this form just needed a signature.  I explained that without seeing the form there may be more questions that need to be addressed.  The patient's wife will drop off the form.  He relayed that this is so he can have work done on his house to make doorways larger to get wheelchair through.

## 2014-11-14 ENCOUNTER — Telehealth: Payer: Self-pay | Admitting: Neurology

## 2014-11-14 NOTE — Telephone Encounter (Signed)
Pt was notified by Medicare part D the Rx for GILENYA 0.5 MG CAPS needs prior auth. the one prior to was declined. Please call and advise.

## 2014-11-14 NOTE — Telephone Encounter (Signed)
I have provided ins with all clinical info.  Request is currently under review.  I called the patient back.  Got no answer.  Left message.

## 2014-11-15 ENCOUNTER — Telehealth: Payer: Self-pay | Admitting: *Deleted

## 2014-11-15 NOTE — Telephone Encounter (Signed)
[name] would like these questions answered about patient, will fax questionnaire to number provided [phone]. Does the patient's diagnosis include a relapsing form of MS? If not please specify patient's diagnosis.

## 2014-11-16 NOTE — Telephone Encounter (Signed)
I have called [facility] several times a day, known picks up on the number given. I will be happy to fill out a questionnaire if this is provided to me.

## 2014-11-20 ENCOUNTER — Encounter: Payer: Self-pay | Admitting: Neurology

## 2014-11-20 ENCOUNTER — Ambulatory Visit (INDEPENDENT_AMBULATORY_CARE_PROVIDER_SITE_OTHER): Payer: Medicare Other | Admitting: Neurology

## 2014-11-20 VITALS — BP 120/79 | HR 56 | Ht 70.0 in | Wt 182.0 lb

## 2014-11-20 DIAGNOSIS — Z5181 Encounter for therapeutic drug level monitoring: Secondary | ICD-10-CM

## 2014-11-20 DIAGNOSIS — G35 Multiple sclerosis: Secondary | ICD-10-CM | POA: Diagnosis not present

## 2014-11-20 MED ORDER — BACLOFEN 10 MG PO TABS: ORAL_TABLET | ORAL | Status: DC

## 2014-11-20 NOTE — Patient Instructions (Signed)

## 2014-11-20 NOTE — Progress Notes (Signed)
Reason for visit: Multiple sclerosis  [name] is an 54 y.o. male  History of present illness:  [name] is a 54 year old right-handed white male with a history of multiple sclerosis. The patient has continued to have a gradual decline in his functional level. He is noticing some increased issues with ataxia involving the left greater than right upper extremity, and some weakness in the arms. The patient is having increased spasms of the lower extremities, he takes Zanaflex at night which seems to help at night, but he cannot take the medication during the day secondary to drowsiness. He has been on baclofen in the past, he believes that this worked better. The patient is having weakness of the core muscles, he is having difficulty sitting up. The patient currently sleeps in a recliner, but he is getting a bed to help adjust positions while he is lying down. The patient is having to get assistance with transfers at this point. He is on Gilenya, he is tolerating medication well.  Past Medical History  Diagnosis Date  . Depression   . Hay fever   . MS (multiple sclerosis)   . Asthma     childhood asthma  . Dysphagia     Past Surgical History  Procedure Laterality Date  . Eye surgeries      x 2; bilateral 74 and 65    Family History  Problem Relation Age of Onset  . Lung cancer      parent  . Uterine cancer      other  . Cancer Father   . Multiple sclerosis Sister   . Seizures Maternal Uncle   . Parkinsonism Maternal Uncle   . Multiple sclerosis Sister   . Multiple sclerosis Paternal Uncle   . Multiple sclerosis Other     Social history:  reports that he has quit smoking. He has never used smokeless tobacco. He reports that he drinks alcohol. He reports that he does not use illicit drugs.   No Known Allergies  Medications:  Current Outpatient Prescriptions on File Prior to Visit  Medication Sig Dispense Refill  . citalopram (CELEXA) 40 MG tablet Take 1  tablet (40 mg total) by mouth daily. 90 tablet 1  . GILENYA 0.5 MG CAPS Take 1 capsule (0.5mg ) by mouth once daily 30 capsule 3  . LINZESS 290 MCG CAPS capsule TAKE ONE CAPSULE BY MOUTH EVERY DAY 30 capsule 5  . naproxen sodium (ANAPROX) 220 MG tablet Take 220 mg by mouth 2 (two) times daily with a meal.     No current facility-administered medications on file prior to visit.    ROS:  Out of a complete 14 system review of symptoms, the patient complains only of the following symptoms, and all other reviewed systems are negative.  Chills Difficulty swallowing Eye pain Choking Heat intolerance Incontinence of bowels Frequency of urination Muscle cramps, walking difficulties, neck pain, neck stiffness Memory loss, dizziness, headache, numbness Weakness, tremors Agitation, confusion, depression  Blood pressure 120/79, pulse 56, height 5\' 10"  (1.778 m), weight 182 lb (82.555 kg).  Physical Exam  General: The patient is alert and cooperative at the time of the examination.  Skin: No significant peripheral edema is noted.   Neurologic Exam  Mental status: The patient is oriented x 3.  Cranial nerves: Facial symmetry is present. Speech is has a cerebellar, ataxic quality. Extraocular movements are full, but there appears to be a left INO. Visual fields are full. Pupils are equal, round,  and reactive to light. Discs are flat bilaterally.  Motor: The patient has good strength in the right arm, some proximal weakness in the left arm. With the lower extremities, there appears to be bilateral 4/5 strength proximally, the patient has better extensor strength the left leg than the right. Mild bilateral foot drops are noted.  Sensory examination: Soft touch sensation is symmetric on the face, arms, and legs.  Coordination: The patient has dysmetria with finger-nose-finger bilaterally, much more prominent on the left than the right. The patient was unable to perform heel-to-shin on either  side.  Gait and station: The patient could not be and leg, he is wheelchair-bound.  Reflexes: Deep tendon reflexes are symmetric.   Assessment/Plan:  1. Multiple sclerosis  2. Gait disturbance  The patient is reporting increasing problems with ataxia of the arms, cramping in the legs, and weakness of the core muscles. The patient is having difficulty with transfers at this point. I will get home health physical and occupational therapy out to the house to improve his functional level. He will continue Gilenya, he will have blood work done today. We will stop the tizanidine, and switch to baclofen taking 10 mg at night, 5 mg in the morning. The patient will follow-up in 6 months, we will need blood work within the next 3 months.  [name] MD 11/20/2014 8:34 PM  [facility] [address] [place], [place] [address]  Phone [phone] Fax [phone]

## 2014-11-21 LAB — CBC WITH DIFFERENTIAL/PLATELET
Basophils Absolute: 0 10*3/uL (ref 0.0–0.2)
Basos: 0 %
EOS: 1 %
Eosinophils Absolute: 0 10*3/uL (ref 0.0–0.4)
HCT: 44.2 % (ref 37.5–51.0)
Hemoglobin: 15.3 g/dL (ref 12.6–17.7)
Immature Grans (Abs): 0 10*3/uL (ref 0.0–0.1)
Immature Granulocytes: 0 %
LYMPHS ABS: 0.4 10*3/uL — AB (ref 0.7–3.1)
Lymphs: 12 %
MCH: 30.6 pg (ref 26.6–33.0)
MCHC: 34.6 g/dL (ref 31.5–35.7)
MCV: 88 fL (ref 79–97)
MONOCYTES: 12 %
Monocytes Absolute: 0.4 10*3/uL (ref 0.1–0.9)
Neutrophils Absolute: 2.4 10*3/uL (ref 1.4–7.0)
Neutrophils Relative %: 75 %
Platelets: 262 10*3/uL (ref 150–379)
RBC: 5 x10E6/uL (ref 4.14–5.80)
RDW: 14 % (ref 12.3–15.4)
WBC: 3.2 10*3/uL — AB (ref 3.4–10.8)

## 2014-11-21 LAB — COMPREHENSIVE METABOLIC PANEL
A/G RATIO: 2.6 — AB (ref 1.1–2.5)
ALBUMIN: 4.7 g/dL (ref 3.5–5.5)
ALK PHOS: 122 IU/L — AB (ref 39–117)
ALT: 29 IU/L (ref 0–44)
AST: 21 IU/L (ref 0–40)
BUN/Creatinine Ratio: 17 (ref 9–20)
BUN: 16 mg/dL (ref 6–24)
Bilirubin Total: 0.6 mg/dL (ref 0.0–1.2)
CO2: 25 mmol/L (ref 18–29)
Calcium: 9.4 mg/dL (ref 8.7–10.2)
Chloride: 101 mmol/L (ref 97–108)
Creatinine, Ser: 0.94 mg/dL (ref 0.76–1.27)
GFR calc Af Amer: 108 mL/min/{1.73_m2} (ref >59)
GFR, EST NON AFRICAN AMERICAN: 93 mL/min/{1.73_m2} (ref >59)
GLOBULIN, TOTAL: 1.8 g/dL (ref 1.5–4.5)
GLUCOSE: 92 mg/dL (ref 65–99)
Potassium: 4.4 mmol/L (ref 3.5–5.2)
SODIUM: 140 mmol/L (ref 134–144)
Total Protein: 6.5 g/dL (ref 6.0–8.5)

## 2014-11-27 ENCOUNTER — Telehealth: Payer: Self-pay | Admitting: Neurology

## 2014-11-27 MED ORDER — BACLOFEN 10 MG PO TABS: ORAL_TABLET | ORAL | Status: DC

## 2014-11-27 NOTE — Telephone Encounter (Signed)
I called back to clarify.  Patient said Rx was sent to [facility], but he would like it re-sent to [facility] instead.  I have re-sent the Rx.

## 2014-11-27 NOTE — Telephone Encounter (Signed)
Patient is calling to find the results of his recent blood work.  Please call.

## 2014-11-27 NOTE — Telephone Encounter (Signed)
Patient is calling is regard to Rx Baclofen 10 mg  prescribed for him last Thursday, 2/11.  [facility] on [address].  Please call.

## 2014-11-27 NOTE — Telephone Encounter (Signed)
I called patient. Blood work was released on my chart, the patient is active on my chart. I reviewed the results verbally with him, white blood count is low on Gilenya as expected. Minimal elevation in the alkaline phosphatase patient level.

## 2014-11-27 NOTE — Telephone Encounter (Signed)
Would you like me to call patient with results?

## 2014-12-26 ENCOUNTER — Telehealth: Payer: Self-pay | Admitting: *Deleted

## 2014-12-26 NOTE — Telephone Encounter (Signed)
Form,[place] [employer] patient pick up 10/30/14,mailed release received 12/26/14.

## 2015-01-03 DIAGNOSIS — S60212A Contusion of left wrist, initial encounter: Secondary | ICD-10-CM | POA: Diagnosis not present

## 2015-01-29 ENCOUNTER — Other Ambulatory Visit: Payer: Self-pay | Admitting: Neurology

## 2015-05-10 DIAGNOSIS — M542 Cervicalgia: Secondary | ICD-10-CM | POA: Insufficient documentation

## 2015-05-10 DIAGNOSIS — M4802 Spinal stenosis, cervical region: Secondary | ICD-10-CM | POA: Insufficient documentation

## 2015-05-21 ENCOUNTER — Encounter: Payer: Self-pay | Admitting: Neurology

## 2015-05-21 ENCOUNTER — Other Ambulatory Visit: Payer: Self-pay | Admitting: Neurology

## 2015-05-21 ENCOUNTER — Ambulatory Visit (INDEPENDENT_AMBULATORY_CARE_PROVIDER_SITE_OTHER): Payer: Medicare Other | Admitting: Neurology

## 2015-05-21 VITALS — BP 122/79 | HR 60 | Ht 70.0 in | Wt 175.0 lb

## 2015-05-21 DIAGNOSIS — N319 Neuromuscular dysfunction of bladder, unspecified: Secondary | ICD-10-CM | POA: Diagnosis not present

## 2015-05-21 DIAGNOSIS — Z5181 Encounter for therapeutic drug level monitoring: Secondary | ICD-10-CM | POA: Diagnosis not present

## 2015-05-21 DIAGNOSIS — G35 Multiple sclerosis: Secondary | ICD-10-CM | POA: Diagnosis not present

## 2015-05-21 MED ORDER — BACLOFEN 10 MG PO TABS: ORAL_TABLET | ORAL | Status: DC

## 2015-05-21 NOTE — Patient Instructions (Addendum)
   Increase the baclofen taking one half of the tablet in the morning, 1-1/2 in the evening, we can go higher depending upon what her needs are. We will give you a prescription for a Hoyer lift for home use. I would recommend seeing an ophthalmologist in the near future, we will check blood work today.   Multiple Sclerosis Multiple sclerosis (MS) is a disease of the central nervous system. It leads to the loss of the insulating covering of the nerves (myelin sheath) of your brain. When this happens, brain signals do not get sent properly or may not get sent at all. The age of onset of MS varies.  CAUSES The cause of MS is unknown. However, it is more common in the [place] than in the [place]. RISK FACTORS There is a higher number of women with MS than men. MS is not an illness that is passed down to you from your family members (inherited). However, your risk of MS is higher if you have a relative with MS. SIGNS AND SYMPTOMS  The symptoms of MS occur in episodes or attacks. These attacks may last weeks to months. There may be long periods of almost no symptoms between attacks. The symptoms of MS vary. This is because of the many different ways it affects the central nervous system. The main symptoms of MS include:  Vision problems and eye pain.  Numbness.  Weakness.  Inability to move your arms, hands, feet, or legs (paralysis).  Balance problems.  Tremors. DIAGNOSIS  Your health care provider can diagnose MS with the help of imaging exams and lab tests. These may include specialized X-ray exams and spinal fluid tests. The best imaging exam to confirm a diagnosis of MS is an MRI. TREATMENT  There is no known cure for MS, but there are medicines that can decrease the number and frequency of attacks. Steroids are often used for short-term relief. Physical and occupational therapy may also help. There are also many new alternative or complementary treatments  available to help control the symptoms of MS. Ask your health care provider if any of these other options are right for you. HOME CARE INSTRUCTIONS   Take medicines as directed by your health care provider.  Exercise as directed by your health care provider. SEEK MEDICAL CARE IF: You begin to feel depressed. SEEK IMMEDIATE MEDICAL CARE IF:  You develop paralysis.  You have problems with bladder, bowel, or sexual function.  You develop mental changes, such as forgetfulness or mood swings.  You have a period of uncontrolled movements (seizure). Document Released: 09/25/2000 Document Revised: 10/03/2013 Document Reviewed: 06/05/2013 [facility] Patient Information 2015 [place], [place]. This information is not intended to replace advice given to you by your health care provider. Make sure you discuss any questions you have with your health care provider.

## 2015-05-21 NOTE — Progress Notes (Signed)
Reason for visit: Multiple sclerosis  [name] is an 54 y.o. male  History of present illness:  [name] is a 54 year old right-handed white male with a history of multiple sclerosis with a gradually progressive course. When last seen, physical and occupational therapy were set up for him, but this never occurred. The patient has had ongoing progression of weakness of the arms, and core muscles. The patient is essentially nonambulatory, he indicates that he is able to stand for transfers most the time, but not all the time. The patient is on baclofen taking one half of a 10 mg tablet in the morning, a full tablet in the evening. He still has some discomfort in the right shoulder and shoulder blade area, and he has been seen through the [facility], MRI of the cervical spine was done and the disc was brought for my review. This shows a central disc bulge at the C5-6 level, no compression of the cord is noted. The patient does have spinal cord lesions at the C2 level and the C3-4 levels. The patient is felt to have central pain associated with his MS. The patient does fall on occasion, the last fall was 2 days ago associated with the transfer. The wife indicates that they have difficulty getting him up from a fall. The patient feels that the problems with swallowing are ongoing, this has not worsened, but the patient may actually choke on his own saliva at times. He remains on Gilenya, he indicates that he has not been regularly followed by an ophthalmologist.  Past Medical History  Diagnosis Date  . Depression   . Hay fever   . MS (multiple sclerosis)   . Asthma     childhood asthma  . Dysphagia     Past Surgical History  Procedure Laterality Date  . Eye surgeries      x 2; bilateral 74 and 89    Family History  Problem Relation Age of Onset  . Lung cancer      parent  . Uterine cancer      other  . Cancer Father   . Multiple sclerosis Sister   . Seizures Maternal  Uncle   . Parkinsonism Maternal Uncle   . Multiple sclerosis Sister   . Multiple sclerosis Paternal Uncle   . Multiple sclerosis Other     Social history:  reports that he has quit smoking. He has never used smokeless tobacco. He reports that he does not drink alcohol or use illicit drugs.   No Known Allergies  Medications:  Prior to Admission medications   Medication Sig Start Date End Date Taking? Authorizing Provider  baclofen (LIORESAL) 10 MG tablet 1/2 tablet in the morning and 1 tablet at night 11/27/14   York Spaniel, MD  citalopram (CELEXA) 40 MG tablet Take 1 tablet (40 mg total) by mouth daily. 08/15/14   York Spaniel, MD  GILENYA 0.5 MG CAPS Take 1 capsule (0.5mg ) by mouth once daily 01/30/15   York Spaniel, MD  LINZESS 290 MCG CAPS capsule TAKE ONE CAPSULE BY MOUTH EVERY DAY 04/18/14   York Spaniel, MD  naproxen sodium (ANAPROX) 220 MG tablet Take 220 mg by mouth 2 (two) times daily with a meal.    Historical Provider, MD    ROS:  Out of a complete 14 system review of symptoms, the patient complains only of the following symptoms, and all other reviewed systems are negative.  Fatigue Difficulty swallowing Light  sensitivity, eye pain, blurred vision Choking Heat intolerance Constipation, incontinence of bowels Restless legs, daytime sleepiness Incontinence of bladder Back pain, achy muscles, muscle cramps, walking difficulty, neck pain, neck stiffness Memory loss, dizziness, headache, numbness, weakness, tremors Confusion, decreased concentration, depression  Blood pressure 122/79, pulse 60, height  (1.778 m), weight 175 lb (79.379 kg).  Physical Exam  General: The patient is alert and cooperative at the time of the examination.  Skin: No significant peripheral edema is noted.   Neurologic Exam  Mental status: The patient is alert and oriented x 3 at the time of the examination. The patient has apparent normal recent and remote memory, with  an apparently normal attention span and concentration ability.   Cranial nerves: Facial symmetry is present. Speech is ataxic, slightly dysarthric. Extraocular movements are full, but end gaze nystagmus is seen bilaterally, worse with right gaze, mild bilateral INO are noted. Visual fields are full. Pupils are equal, round, and reactive to light. Discs are flat bilaterally.  Motor: The patient has good strength in the upper extremities, the patient has one over 5 strength in the right leg, 2/5 strength in the left leg.  Sensory examination: Soft touch sensation is symmetric on the face and arms. There is some decrease in soft touch sensation on the right leg compared to the left.  Coordination: The patient has ataxia with finger-nose-finger bilaterally, worse on the right arm than the left. He is not able to perform heel-to-shin on either side..  Gait and station: The patient is unable to ambulate, wheelchair-bound.  Reflexes: Deep tendon reflexes are symmetric, somewhat depressed.   Assessment/Plan:  1. Multiple sclerosis  2. Gait disorder  3. Neurogenic bladder  4. Classic migraine headache  5. Chronic pain syndrome, right arm and shoulder  The patient remains on Gilenya. He will get blood work today, and I have recommended an ophthalmology evaluation. The patient will be increased on the baclofen taking one half tablet in the morning, 1.5 tablets in evening. A prescription was called in for this. The patient was given a prescription for a Hoyer lift. He will follow-up in 6 months. The patient appears to have classic migraine with visual phenomenon with a mild headache afterwards on occasion.  [name] MD 05/21/2015 9:08 PM  [facility] [address] [place], [place] [address]  Phone [phone] Fax [phone]

## 2015-05-22 LAB — COMPREHENSIVE METABOLIC PANEL
ALBUMIN: 4.4 g/dL
ALK PHOS: 163 IU/L
ALT: 45 IU/L
AST: 20 IU/L (ref 0–40)
Albumin/Globulin Ratio: 1.9 (ref 1.1–2.5)
BILIRUBIN TOTAL: 0.4 mg/dL
BUN/Creatinine Ratio: 20
BUN: 21 mg/dL
CO2: 25 mmol/L (ref 18–29)
CREATININE: 1.03 mg/dL
Calcium: 9.6 mg/dL
Chloride: 104 mmol/L (ref 97–108)
GLUCOSE: 90 mg/dL (ref 65–99)
Globulin, Total: 2.3 g/dL (ref 1.5–4.5)
POTASSIUM: 4.4 mmol/L (ref 3.5–5.2)
Sodium: 145 mmol/L — ABNORMAL HIGH (ref 134–144)
Total Protein: 6.7 g/dL (ref 6.0–8.5)

## 2015-05-22 LAB — CBC WITH DIFFERENTIAL/PLATELET
BASOS: 0 %
Basophils Absolute: 0 10*3/uL
EOS (ABSOLUTE): 0.1 10*3/uL
EOS: 2 %
Hematocrit: 40.5 %
Hemoglobin: 13.7 g/dL
Immature Grans (Abs): 0 10*3/uL
Immature Granulocytes: 0 %
Lymphocytes Absolute: 0.2 10*3/uL
Lymphs: 6 %
MCH: 30.6 pg
MCHC: 33.8 g/dL
MCV: 90 fL
MONOCYTES: 12 %
Monocytes Absolute: 0.4 10*3/uL
NEUTROS ABS: 2.8 10*3/uL
Neutrophils: 80 %
PLATELETS: 291 10*3/uL (ref 150–379)
RBC: 4.48 x10E6/uL
RDW: 14.5 %
WBC: 3.5 10*3/uL (ref 3.4–10.8)

## 2015-05-23 ENCOUNTER — Telehealth: Payer: Self-pay

## 2015-05-23 ENCOUNTER — Other Ambulatory Visit: Payer: Self-pay

## 2015-05-23 MED ORDER — BACLOFEN 10 MG PO TABS: ORAL_TABLET | ORAL | Status: DC

## 2015-05-23 NOTE — Telephone Encounter (Signed)
Previous transmission failed   baclofen (LIORESAL) 10 MG tablet 180 each 1 05/21/2015      Sig: 1/2 tablet in the morning and 1.5 tablets at night    E-Prescribing Status: Transmission to pharmacy failed (05/21/2015 8:55 AM EDT)

## 2015-05-23 NOTE — Telephone Encounter (Signed)
-----   Message from York Spaniel, MD sent at 05/22/2015  7:48 PM EDT ----- This patient is on Mychart, but on the last RV it was apparent that he had no clue how to access his information. Please call and tell him that the blood work is OK, WBC is low, OK on Gilenya, this is expected. Minimal elevation of sodium, not clinically significant. Thanks. ----- Message -----    From: [facility] Lab Results In Interface    Sent: 05/22/2015   7:40 PM      To: York Spaniel, MD

## 2015-05-23 NOTE — Telephone Encounter (Signed)
I called the patient and relayed results. 

## 2015-07-17 ENCOUNTER — Telehealth: Payer: Self-pay | Admitting: Neurology

## 2015-07-17 DIAGNOSIS — G35 Multiple sclerosis: Secondary | ICD-10-CM

## 2015-07-17 MED ORDER — PREDNISONE 10 MG PO TABS: ORAL_TABLET | ORAL | Status: DC

## 2015-07-17 NOTE — Telephone Encounter (Signed)
Pt called stating he is have MC exacerbation and would like dose of steroid. Please call and advise at [phone]

## 2015-07-17 NOTE — Telephone Encounter (Signed)
Appointment scheduled 10/26.

## 2015-07-17 NOTE — Telephone Encounter (Signed)
I called the patient. The patient has had a four-day history of increased weakness and fatigue that involves arms and legs. The patient has had some clouding of cognitive thinking. No definite fevers or chills. The patient has not had any falls. I will get home health nursing for IV steroid, AND AFTER THIS I will start a A PREDNISONE DOSEPAK, 10 MG 12 DAY PACK. I will try to get a revisit to see the patient in 2-3 weeks.

## 2015-07-17 NOTE — Telephone Encounter (Signed)
I called the patient. He states this is the fourth day he has felt confused and fatigued. He also states that his arms and legs will not do what he wants them to do and his back is having spasms. He states this is similar to previous exacerbations he has had but is more involved. He stated previously IV steroids have helped him best. I advised I would let Dr. Anne Hahn know what is going on and call him back with a plan.

## 2015-07-17 NOTE — Telephone Encounter (Signed)
Spoke to [facility] with [facility] home health. She is getting patient set up with IV steroid.

## 2015-07-18 DIAGNOSIS — N319 Neuromuscular dysfunction of bladder, unspecified: Secondary | ICD-10-CM | POA: Diagnosis not present

## 2015-07-18 DIAGNOSIS — Z7952 Long term (current) use of systemic steroids: Secondary | ICD-10-CM | POA: Diagnosis not present

## 2015-07-18 DIAGNOSIS — G894 Chronic pain syndrome: Secondary | ICD-10-CM | POA: Diagnosis not present

## 2015-07-18 DIAGNOSIS — G35 Multiple sclerosis: Secondary | ICD-10-CM | POA: Diagnosis not present

## 2015-07-18 DIAGNOSIS — Z452 Encounter for adjustment and management of vascular access device: Secondary | ICD-10-CM | POA: Diagnosis not present

## 2015-07-18 DIAGNOSIS — F329 Major depressive disorder, single episode, unspecified: Secondary | ICD-10-CM | POA: Diagnosis not present

## 2015-07-18 DIAGNOSIS — J45909 Unspecified asthma, uncomplicated: Secondary | ICD-10-CM | POA: Diagnosis not present

## 2015-07-18 DIAGNOSIS — R1310 Dysphagia, unspecified: Secondary | ICD-10-CM | POA: Diagnosis not present

## 2015-07-18 NOTE — Telephone Encounter (Signed)
I spoke to [name]. IV Solu-Medrol was to be delivered to the home around 2 PM today and the home health nurse is going to the home at 3:30 PM to administer it.

## 2015-07-19 DIAGNOSIS — G894 Chronic pain syndrome: Secondary | ICD-10-CM | POA: Diagnosis not present

## 2015-07-19 DIAGNOSIS — G35 Multiple sclerosis: Secondary | ICD-10-CM | POA: Diagnosis not present

## 2015-07-19 DIAGNOSIS — F329 Major depressive disorder, single episode, unspecified: Secondary | ICD-10-CM | POA: Diagnosis not present

## 2015-07-19 DIAGNOSIS — J45909 Unspecified asthma, uncomplicated: Secondary | ICD-10-CM | POA: Diagnosis not present

## 2015-07-19 DIAGNOSIS — R1310 Dysphagia, unspecified: Secondary | ICD-10-CM | POA: Diagnosis not present

## 2015-07-19 DIAGNOSIS — N319 Neuromuscular dysfunction of bladder, unspecified: Secondary | ICD-10-CM | POA: Diagnosis not present

## 2015-07-20 DIAGNOSIS — F329 Major depressive disorder, single episode, unspecified: Secondary | ICD-10-CM | POA: Diagnosis not present

## 2015-07-20 DIAGNOSIS — G35 Multiple sclerosis: Secondary | ICD-10-CM | POA: Diagnosis not present

## 2015-07-20 DIAGNOSIS — N319 Neuromuscular dysfunction of bladder, unspecified: Secondary | ICD-10-CM | POA: Diagnosis not present

## 2015-07-20 DIAGNOSIS — R1310 Dysphagia, unspecified: Secondary | ICD-10-CM | POA: Diagnosis not present

## 2015-07-20 DIAGNOSIS — G894 Chronic pain syndrome: Secondary | ICD-10-CM | POA: Diagnosis not present

## 2015-07-20 DIAGNOSIS — J45909 Unspecified asthma, uncomplicated: Secondary | ICD-10-CM | POA: Diagnosis not present

## 2015-08-02 DIAGNOSIS — J45909 Unspecified asthma, uncomplicated: Secondary | ICD-10-CM | POA: Diagnosis not present

## 2015-08-02 DIAGNOSIS — G35 Multiple sclerosis: Secondary | ICD-10-CM | POA: Diagnosis not present

## 2015-08-02 DIAGNOSIS — R1310 Dysphagia, unspecified: Secondary | ICD-10-CM | POA: Diagnosis not present

## 2015-08-02 DIAGNOSIS — G894 Chronic pain syndrome: Secondary | ICD-10-CM | POA: Diagnosis not present

## 2015-08-02 DIAGNOSIS — F329 Major depressive disorder, single episode, unspecified: Secondary | ICD-10-CM | POA: Diagnosis not present

## 2015-08-02 DIAGNOSIS — N319 Neuromuscular dysfunction of bladder, unspecified: Secondary | ICD-10-CM | POA: Diagnosis not present

## 2015-08-07 ENCOUNTER — Encounter: Payer: Self-pay | Admitting: Neurology

## 2015-08-07 ENCOUNTER — Ambulatory Visit (INDEPENDENT_AMBULATORY_CARE_PROVIDER_SITE_OTHER): Payer: Medicare Other | Admitting: Neurology

## 2015-08-07 VITALS — BP 123/82 | HR 62 | Ht 70.0 in

## 2015-08-07 DIAGNOSIS — R1310 Dysphagia, unspecified: Secondary | ICD-10-CM | POA: Diagnosis not present

## 2015-08-07 DIAGNOSIS — G35 Multiple sclerosis: Secondary | ICD-10-CM

## 2015-08-07 DIAGNOSIS — G894 Chronic pain syndrome: Secondary | ICD-10-CM | POA: Diagnosis not present

## 2015-08-07 DIAGNOSIS — J45909 Unspecified asthma, uncomplicated: Secondary | ICD-10-CM | POA: Diagnosis not present

## 2015-08-07 DIAGNOSIS — F329 Major depressive disorder, single episode, unspecified: Secondary | ICD-10-CM | POA: Diagnosis not present

## 2015-08-07 DIAGNOSIS — N319 Neuromuscular dysfunction of bladder, unspecified: Secondary | ICD-10-CM

## 2015-08-07 NOTE — Patient Instructions (Signed)
Multiple Sclerosis °Multiple sclerosis (MS) is a disease of the central nervous system. It leads to the loss of the insulating covering of the nerves (myelin sheath) of your brain. When this happens, brain signals do not get sent properly or may not get sent at all. The age of onset of MS varies.  °CAUSES °The cause of MS is unknown. However, it is more common in the [place] than in the [place]. °RISK FACTORS °There is a higher number of women with MS than men. MS is not an illness that is passed down to you from your family members (inherited). However, your risk of MS is higher if you have a relative with MS. °SIGNS AND SYMPTOMS  °The symptoms of MS occur in episodes or attacks. These attacks may last weeks to months. There may be long periods of almost no symptoms between attacks. The symptoms of MS vary. This is because of the many different ways it affects the central nervous system. The main symptoms of MS include: °· Vision problems and eye pain. °· Numbness. °· Weakness. °· Inability to move your arms, hands, feet, or legs (paralysis). °· Balance problems. °· Tremors. °DIAGNOSIS  °Your health care provider can diagnose MS with the help of imaging exams and lab tests. These may include specialized X-ray exams and spinal fluid tests. The best imaging exam to confirm a diagnosis of MS is an MRI. °TREATMENT  °There is no known cure for MS, but there are medicines that can decrease the number and frequency of attacks. Steroids are often used for short-term relief. Physical and occupational therapy may also help. There are also many new alternative or complementary treatments available to help control the symptoms of MS. Ask your health care provider if any of these other options are right for you. °HOME CARE INSTRUCTIONS  °· Take medicines as directed by your health care provider. °· Exercise as directed by your health care provider. °SEEK MEDICAL CARE IF: °You begin to feel  depressed. °SEEK IMMEDIATE MEDICAL CARE IF: °· You develop paralysis. °· You have problems with bladder, bowel, or sexual function. °· You develop mental changes, such as forgetfulness or mood swings. °· You have a period of uncontrolled movements (seizure). °  °This information is not intended to replace advice given to you by your health care provider. Make sure you discuss any questions you have with your health care provider. °  °Document Released: 09/25/2000 Document Revised: 10/03/2013 Document Reviewed: 06/05/2013 °[employer] Interactive Patient Education ©2016 [employer] ° °

## 2015-08-07 NOTE — Progress Notes (Signed)
Reason for visit: Multiple sclerosis  [name] is an 54 y.o. male  History of present illness:  [name] is a 54 year old right-handed white male with a history of multiple sclerosis. The patient had an exacerbation 2 weeks ago associated with increased weakness, fatigue, and cognitive decline. The patient denied any fevers or chills. Home health nursing was sent to the house, he received a three-day course of Solu-Medrol, and then went on an oral prednisone taper. The patient has done quite well on this regimen, he feels that he is at or near his usual baseline. He has symptoms chronically that are more significant on the right side of the body than the left. He has a severe gait disorder, but he reports no recent falls. The patient returns to this office for an evaluation. He has benefits through the [facility], he is now getting his Gilenya through the hospital.  Past Medical History  Diagnosis Date  . Depression   . Hay fever   . MS (multiple sclerosis) (HCC)   . Asthma     childhood asthma  . Dysphagia   . Classic migraine     Past Surgical History  Procedure Laterality Date  . Eye surgeries      x 2; bilateral 74 and 68    Family History  Problem Relation Age of Onset  . Lung cancer      parent  . Uterine cancer      other  . Cancer Father   . Multiple sclerosis Sister   . Seizures Maternal Uncle   . Parkinsonism Maternal Uncle   . Multiple sclerosis Sister   . Multiple sclerosis Paternal Uncle   . Multiple sclerosis Other     Social history:  reports that he has quit smoking. He has never used smokeless tobacco. He reports that he does not drink alcohol or use illicit drugs.   No Known Allergies  Medications:  Prior to Admission medications   Medication Sig Start Date End Date Taking? Authorizing Provider  baclofen (LIORESAL) 10 MG tablet 1/2 tablet in the morning and 1.5 tablets at night 05/23/15  Yes York Spaniel, MD  citalopram (CELEXA)  40 MG tablet Take 1 tablet (40 mg total) by mouth daily. 08/15/14  Yes York Spaniel, MD  GILENYA 0.5 MG CAPS Take 1 capsule (0.5mg ) by mouth once daily 01/30/15  Yes York Spaniel, MD  LINZESS 290 MCG CAPS capsule TAKE ONE CAPSULE BY MOUTH EVERY DAY 04/18/14  Yes York Spaniel, MD  naproxen sodium (ANAPROX) 220 MG tablet Take 220 mg by mouth 2 (two) times daily with a meal.   Yes Historical Provider, MD    ROS:  Out of a complete 14 system review of symptoms, the patient complains only of the following symptoms, and all other reviewed systems are negative.  Fatigue Difficulty swallowing Light sensitivity, eye pain Wheezing, choking Heat intolerance Constipation, incontinence of bowels Incontinence of bladder Back pain, achy muscles, muscle cramps, walking difficulty, neck pain, neck stiffness Memory loss, dizziness, headache, numbness, weakness, tremors Confusion, depression  Blood pressure 123/82, pulse 62, height  (1.778 m).  Physical Exam  General: The patient is alert and cooperative at the time of the examination.  Skin: 2+ edema below the knees is noted bilaterally..   Neurologic Exam  Mental status: The patient is alert and oriented x 3 at the time of the examination. The patient has apparent normal recent and remote memory, with an  apparently normal attention span and concentration ability.   Cranial nerves: Facial symmetry is present. Speech is slightly dysarthric, ataxic. Extraocular movements are full. End gaze nystagmus is seen with horizontal gaze bilaterally. Visual fields are full. Pupils are equal, round, and reactive to light. Discs are flat bilaterally.  Motor: The patient has good strength in the upper extremities. Motor tone is significant increased in both lower extremities, the patient has 3/5 strength on the right, 4/5 on the left leg.  Sensory examination: Soft touch sensation is symmetric on the face, arms, and legs.  Coordination: The  patient has good finger-nose-finger with the left arm, ataxia noted on the right. The patient has difficulty performing heel-to-shin bilaterally, but more on the right.  Gait and station: The patient has significant difficulty with ambulation, he cannot be functioning ambulated, wheelchair-bound.  Reflexes: Deep tendon reflexes are symmetric, somewhat increased in the legs.   Assessment/Plan:  1. Multiple sclerosis  2. Gait disorder  The patient has had a recent exacerbation of his multiple sclerosis, but he fortunately has improved significantly following treatment with Solu-Medrol. The patient will continue the Gilenya. He had blood work done through the [facility] on 07/04/2015. This included a chemistry profile, liver profile, CBC, and a JC virus antibody. The JC virus antibody was negative. Other blood work was unremarkable. The patient will follow-up for his usual revisit in February 2017.  [name] MD 08/07/2015 6:48 PM  [facility] [address] [place], [place] [address]  Phone [phone] Fax [phone]

## 2015-08-08 ENCOUNTER — Telehealth: Payer: Self-pay | Admitting: *Deleted

## 2015-08-08 NOTE — Telephone Encounter (Signed)
Form,[employer] received,completed by Dr Anne Hahn and Earney Navy mailed to patient 08/08/15.

## 2015-08-14 ENCOUNTER — Ambulatory Visit (INDEPENDENT_AMBULATORY_CARE_PROVIDER_SITE_OTHER): Payer: Medicare Other | Admitting: Family Medicine

## 2015-08-14 ENCOUNTER — Encounter: Payer: Self-pay | Admitting: Family Medicine

## 2015-08-14 VITALS — BP 120/78 | HR 64 | Temp 97.7°F

## 2015-08-14 DIAGNOSIS — Z23 Encounter for immunization: Secondary | ICD-10-CM | POA: Diagnosis not present

## 2015-08-14 DIAGNOSIS — Z0000 Encounter for general adult medical examination without abnormal findings: Secondary | ICD-10-CM | POA: Diagnosis not present

## 2015-08-14 DIAGNOSIS — E785 Hyperlipidemia, unspecified: Secondary | ICD-10-CM

## 2015-08-14 DIAGNOSIS — R739 Hyperglycemia, unspecified: Secondary | ICD-10-CM | POA: Diagnosis not present

## 2015-08-14 LAB — LIPID PANEL
CHOL/HDL RATIO: 5
Cholesterol: 234 mg/dL — ABNORMAL HIGH (ref 0–200)
HDL: 47.8 mg/dL (ref >39.00)
LDL Cholesterol: 163 mg/dL — ABNORMAL HIGH (ref 0–99)
NonHDL: 186.24
Triglycerides: 116 mg/dL (ref 0.0–149.0)
VLDL: 23.2 mg/dL (ref 0.0–40.0)

## 2015-08-14 LAB — HEMOGLOBIN A1C: HEMOGLOBIN A1C: 5.4 % (ref 4.6–6.5)

## 2015-08-14 NOTE — Progress Notes (Signed)
HPI:  Here for CPE:  He has a PMH significant for MS with neurogenic bladder and severe gait disturbance managed by his neurologist in the community and at the [place]. He is on Gilenya. Per notes, he recently had labs including CMP, CBC and JC virus antibody at the [place], which was unremarkable per neurology notation. He recently completed a course of steroids for an MS exacerbation. He reports he is feeling well now. Reports mood is good since starting cymbalta.  -Diet: variety of foods, balance and well rounded, larger portion sizes  -Exercise: no regular exercise  -Diabetes and Dyslipidemia Screening: FASTING today for labs  -wants STI testing, Hep C screening (if born 65-1963): no  -FH colon or prstate ca: see FH Last colon cancer screening: done this year at the [place] Last prostate ca screening: discussed/declined  -Alcohol, Tobacco, drug use: see social history  Review of Systems - no fevers, unintentional weight loss, vision loss, hearing loss, chest pain, sob, hemoptysis, melena, hematochezia, hematuria, genital discharge, changing or concerning skin lesions, bleeding, bruising, loc, thoughts of self harm or SI  Past Medical History  Diagnosis Date  . Depression   . Hay fever   . MS (multiple sclerosis) (HCC)   . Asthma     childhood asthma  . Dysphagia   . Classic migraine     Past Surgical History  Procedure Laterality Date  . Eye surgeries      x 2; bilateral 74 and 65    Family History  Problem Relation Age of Onset  . Lung cancer      parent  . Uterine cancer      other  . Cancer Father   . Multiple sclerosis Sister   . Seizures Maternal Uncle   . Parkinsonism Maternal Uncle   . Multiple sclerosis Sister   . Multiple sclerosis Paternal Uncle   . Multiple sclerosis Other     Social History   Social History  . Marital Status: Married    Spouse Name: joy  . Number of Children: 2  . Years of Education: college   Occupational History  . disabled     Social History Main Topics  . Smoking status: Former Games developer  . Smokeless tobacco: Never Used  . Alcohol Use: No     Comment: rare   . Drug Use: No  . Sexual Activity: Not Asked   Other Topics Concern  . None   Social History Narrative   Patient is right handed.   Patient drinks 1 cup caffeine daily.     Current outpatient prescriptions:  .  baclofen (LIORESAL) 10 MG tablet, 1/2 tablet in the morning and 1.5 tablets at night, Disp: 180 each, Rfl: 1 .  citalopram (CELEXA) 40 MG tablet, Take 1 tablet (40 mg total) by mouth daily., Disp: 90 tablet, Rfl: 1 .  GILENYA 0.5 MG CAPS, Take 1 capsule (0.5mg ) by mouth once daily, Disp: 30 capsule, Rfl: 6 .  LINZESS 290 MCG CAPS capsule, TAKE ONE CAPSULE BY MOUTH EVERY DAY, Disp: 30 capsule, Rfl: 5 .  naproxen sodium (ANAPROX) 220 MG tablet, Take 220 mg by mouth 2 (two) times daily with a meal., Disp: , Rfl:  .  TIZANIDINE HCL PO, Take by mouth., Disp: , Rfl:   EXAM:  Filed Vitals:   08/14/15 1131  BP: 120/78  Pulse: 64  Temp: 97.7 F (36.5 C)  TempSrc: Oral    Estimated body mass index is 25.11 kg/(m^2) as calculated from the following:  Height as of 08/07/15:  (1.778 m).   Weight as of 05/21/15: 175 lb (79.379 kg).  GENERAL: vitals reviewed and listed below, alert, oriented, appears well hydrated and in no acute distress  HEENT: head atraumatic, PERRLA, normal appearance of eyes, ears, nose and mouth. moist mucus membranes.  NECK: supple, no masses or lymphadenopathy  LUNGS: clear to auscultation bilaterally, no rales, rhonchi or wheeze  CV: HRRR, no peripheral edema or cyanosis, normal pedal pulses  ABDOMEN: bowel sounds normal, soft, non tender to palpation, no masses, no rebound or guarding  ZO:[id number]  SKIN: no rash or abnormal lesions  MS: in wheelchair  PSYCH: normal affect, pleasant and cooperative  ASSESSMENT AND PLAN:  Discussed the following assessment and plan:  Visit for preventive health  examination  Hyperlipemia - Plan: Lipid Panel  Hyperglycemia - Plan: Hemoglobin A1c   -Discussed and advised all [place] preventive services health task force level A and B recommendations for age, sex and risks.  -Advised at least 150 minutes of exercise per week and a healthy diet low in saturated fats and sweets and consisting of fresh fruits and vegetables, lean meats such as fish and white chicken and whole grains.  -labs, studies and vaccines per orders this encounter   Patient advised to return to clinic immediately if symptoms worsen or persist or new concerns.  Patient Instructions  BEFORE YOU LEAVE: -flu shot -labs  -We have ordered labs or studies at this visit. It can take up to 1-2 weeks for results and processing. We will contact you with instructions IF your results are abnormal. Normal results will be released to your [facility]. If you have not heard from [place] or can not find your results in [facility] in 2 weeks please contact our office.   We recommend the following healthy lifestyle measures: - eat a healthy whole foods diet consisting of regular small meals composed of vegetables, fruits, beans, nuts, seeds, healthy meats such as white chicken and fish and whole grains.  - avoid sweets, white starchy foods, fried foods, fast food, processed foods, sodas, red meet and other fattening foods.   Follow up yearly    No Follow-up on file.   Kriste Basque R.

## 2015-08-14 NOTE — Progress Notes (Signed)
Pre visit review using our clinic review tool, if applicable. No additional management support is needed unless otherwise documented below in the visit note. 

## 2015-08-14 NOTE — Patient Instructions (Addendum)
BEFORE YOU LEAVE: -flu shot -labs  -We have ordered labs or studies at this visit. It can take up to 1-2 weeks for results and processing. We will contact you with instructions IF your results are abnormal. Normal results will be released to your [facility]. If you have not heard from [place] or can not find your results in [facility] in 2 weeks please contact our office.   We recommend the following healthy lifestyle measures: - eat a healthy whole foods diet consisting of regular small meals composed of vegetables, fruits, beans, nuts, seeds, healthy meats such as white chicken and fish and whole grains.  - avoid sweets, white starchy foods, fried foods, fast food, processed foods, sodas, red meet and other fattening foods.   Follow up yearly

## 2015-08-22 DIAGNOSIS — F329 Major depressive disorder, single episode, unspecified: Secondary | ICD-10-CM | POA: Diagnosis not present

## 2015-08-22 DIAGNOSIS — G894 Chronic pain syndrome: Secondary | ICD-10-CM | POA: Diagnosis not present

## 2015-08-22 DIAGNOSIS — J45909 Unspecified asthma, uncomplicated: Secondary | ICD-10-CM | POA: Diagnosis not present

## 2015-08-22 DIAGNOSIS — R1310 Dysphagia, unspecified: Secondary | ICD-10-CM | POA: Diagnosis not present

## 2015-08-22 DIAGNOSIS — G35 Multiple sclerosis: Secondary | ICD-10-CM | POA: Diagnosis not present

## 2015-08-22 DIAGNOSIS — N319 Neuromuscular dysfunction of bladder, unspecified: Secondary | ICD-10-CM | POA: Diagnosis not present

## 2015-09-20 ENCOUNTER — Encounter: Payer: Self-pay | Admitting: Family Medicine

## 2015-11-21 ENCOUNTER — Ambulatory Visit (INDEPENDENT_AMBULATORY_CARE_PROVIDER_SITE_OTHER): Payer: Medicare Other | Admitting: Neurology

## 2015-11-21 ENCOUNTER — Encounter: Payer: Self-pay | Admitting: Neurology

## 2015-11-21 VITALS — BP 118/81 | HR 62 | Ht 70.0 in

## 2015-11-21 DIAGNOSIS — R404 Transient alteration of awareness: Secondary | ICD-10-CM

## 2015-11-21 DIAGNOSIS — R269 Unspecified abnormalities of gait and mobility: Secondary | ICD-10-CM

## 2015-11-21 DIAGNOSIS — G35 Multiple sclerosis: Secondary | ICD-10-CM | POA: Diagnosis not present

## 2015-11-21 DIAGNOSIS — N319 Neuromuscular dysfunction of bladder, unspecified: Secondary | ICD-10-CM

## 2015-11-21 DIAGNOSIS — Z5181 Encounter for therapeutic drug level monitoring: Secondary | ICD-10-CM

## 2015-11-21 HISTORY — DX: Unspecified abnormalities of gait and mobility: R26.9

## 2015-11-21 NOTE — Progress Notes (Signed)
Reason for visit: Multiple sclerosis  [name] is an 55 y.o. 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 The patient is not ambulatory, he has not had any recent falls. He does have a Hoyer lift at home that he can use if needed. The patient has had some gradual progression of weakness and dysfunction of the hands. The patient has significant ataxia with the use of the extremities. He uses weights on the wrist to help him use a spoon or fork. The patient has had episodes of sleep paralysis while taking baclofen, he has stopped the evening dose. Off the baclofen at night, he sleeps fairly well without problems. The patient has had an event 2 weeks ago where he was in his wheelchair, he was looking out the window when he suddenly woke up, he had urinary incontinence, he had lost about 20 minutes of time. He did not bite his tongue, he did not have any muscle soreness following the event. The episode was unwitnessed. The patient has not had any events before or since. He remains on Gilenya, he is tolerating the medication well. He indicates that his sister has multiple sclerosis, she has suffered from a PML infection on Tysabri. For this reason, he does not wish to consider this medication for multiple sclerosis.  Past Medical History  Diagnosis Date  . Depression   . Hay fever   . MS (multiple sclerosis) (HCC)   . Asthma     childhood asthma  . Dysphagia   . Classic migraine   . Abnormality of gait 11/21/2015    Past Surgical History  Procedure Laterality Date  . Eye surgeries      x 2; bilateral 74 and 50    Family History  Problem Relation Age of Onset  . Lung cancer      parent  . Uterine cancer      other  . Cancer Father   . Multiple sclerosis Sister   . Seizures Maternal Uncle   . Parkinsonism Maternal Uncle   . Multiple  sclerosis Sister   . Multiple sclerosis Paternal Uncle   . Multiple sclerosis Other     Social history:  reports that he has quit smoking. He has never used smokeless tobacco. He reports that he does not drink alcohol or use illicit drugs.   No Known Allergies  Medications:  Prior to Admission medications   Medication Sig Start Date End Date Taking? Authorizing Provider  baclofen (LIORESAL) 10 MG tablet 1/2 tablet in the morning and 1.5 tablets at night 05/23/15   York Spaniel, MD  citalopram (CELEXA) 40 MG tablet Take 1 tablet (40 mg total) by mouth daily. 08/15/14   York Spaniel, MD  GILENYA 0.5 MG CAPS Take 1 capsule (0.5mg ) by mouth once daily 01/30/15   York Spaniel, MD  LINZESS 290 MCG CAPS capsule TAKE ONE CAPSULE BY MOUTH EVERY DAY 04/18/14   York Spaniel, MD  naproxen sodium (ANAPROX) 220 MG tablet Take 220 mg by mouth 2 (two) times daily with a meal.    Historical Provider, MD  TIZANIDINE HCL PO Take by mouth.    Historical Provider, MD    ROS:  Out of a complete 14 system review of symptoms, the patient complains only of the following symptoms, and all other reviewed systems are negative.  Fatigue  Runny nose, difficulty swallowing Light sensitivity, eye pain Choking Leg swelling Heat intolerance Constipation, incontinence of bowels Incontinence of bladder Back pain, achy muscles, muscle cramps, walking difficulty, neck pain, neck stiffness Memory loss, dizziness, headache, numbness Depression  Blood pressure 118/81, pulse 62, height 5\' 10"  (1.778 m).  Physical Exam  General: The patient is alert and cooperative at the time of the examination.  Skin: 2+ edema below the knees is noted.   Neurologic Exam  Mental status: The patient is alert and oriented x 3 at the time of the examination. The patient has apparent normal recent and remote memory, with an apparently normal attention span and concentration ability.   Cranial nerves: Facial symmetry is  present. Speech is slightly ataxic, not aphasic. Extraocular movements are full, coarse and gaze nystagmus is seen. There appears to be a left INO. Visual fields are full. Pupils are equal, round, and reactive to light. Discs are flat bilaterally. Good venous pulsations are seen.  Motor: The patient has good strength in the upper extremities. With the lower extremities, there is 2/5 strength bilaterally, somewhat worse on the right. Increased motor tone is noted in the legs.  Sensory examination: Soft touch sensation is symmetric on the face. There is some decreased sensation on the right arm and right leg relative to the left.  Coordination: The patient has significant ataxia with finger-nose-finger bilaterally, left greater than right. The patient is not able to perform heel-to-shin on either side.  Gait and station: The patient has a normal gait. Tandem gait is normal. Romberg is negative. No drift is seen.  Reflexes: Deep tendon reflexes are symmetric.   Assessment/Plan:  1. Multiple sclerosis  2. Gait disturbance  3. Neurogenic bladder  4. Transient episode of loss of awareness  The patient had an event where he lost consciousness for about 20 minutes. The patient will need to be evaluated for possible seizure events. The event was unwitnessed. The patient will contact me if he has another episode. He will be set up for an EEG study. Blood work will be done today, the patient will continue the Gilenya. He will follow-up in 6 months, sooner if needed.  [name] MD 11/21/2015 7:11 PM  [facility] [address] [place], [place] [address]  Phone [phone] Fax [phone]

## 2015-11-21 NOTE — Patient Instructions (Signed)
Multiple Sclerosis °Multiple sclerosis (MS) is a disease of the central nervous system. It leads to the loss of the insulating covering of the nerves (myelin sheath) of your brain. When this happens, brain signals do not get sent properly or may not get sent at all. The age of onset of MS varies.  °CAUSES °The cause of MS is unknown. However, it is more common in the [place] than in the [place]. °RISK FACTORS °There is a higher number of women with MS than men. MS is not an illness that is passed down to you from your family members (inherited). However, your risk of MS is higher if you have a relative with MS. °SIGNS AND SYMPTOMS  °The symptoms of MS occur in episodes or attacks. These attacks may last weeks to months. There may be long periods of almost no symptoms between attacks. The symptoms of MS vary. This is because of the many different ways it affects the central nervous system. The main symptoms of MS include: °· Vision problems and eye pain. °· Numbness. °· Weakness. °· Inability to move your arms, hands, feet, or legs (paralysis). °· Balance problems. °· Tremors. °DIAGNOSIS  °Your health care provider can diagnose MS with the help of imaging exams and lab tests. These may include specialized X-ray exams and spinal fluid tests. The best imaging exam to confirm a diagnosis of MS is an MRI. °TREATMENT  °There is no known cure for MS, but there are medicines that can decrease the number and frequency of attacks. Steroids are often used for short-term relief. Physical and occupational therapy may also help. There are also many new alternative or complementary treatments available to help control the symptoms of MS. Ask your health care provider if any of these other options are right for you. °HOME CARE INSTRUCTIONS  °· Take medicines as directed by your health care provider. °· Exercise as directed by your health care provider. °SEEK MEDICAL CARE IF: °You begin to feel  depressed. °SEEK IMMEDIATE MEDICAL CARE IF: °· You develop paralysis. °· You have problems with bladder, bowel, or sexual function. °· You develop mental changes, such as forgetfulness or mood swings. °· You have a period of uncontrolled movements (seizure). °  °This information is not intended to replace advice given to you by your health care provider. Make sure you discuss any questions you have with your health care provider. °  °Document Released: 09/25/2000 Document Revised: 10/03/2013 Document Reviewed: 06/05/2013 °[employer] Interactive Patient Education ©2016 [employer] ° °

## 2015-11-22 LAB — CBC WITH DIFFERENTIAL/PLATELET
BASOS ABS: 0 10*3/uL (ref 0.0–0.2)
BASOS: 0 %
EOS (ABSOLUTE): 0 10*3/uL (ref 0.0–0.4)
EOS: 1 %
HEMOGLOBIN: 14.2 g/dL (ref 12.6–17.7)
Hematocrit: 42 % (ref 37.5–51.0)
Immature Grans (Abs): 0 10*3/uL (ref 0.0–0.1)
Immature Granulocytes: 0 %
Lymphocytes Absolute: 0.3 10*3/uL — ABNORMAL LOW (ref 0.7–3.1)
Lymphs: 10 %
MCH: 30.8 pg (ref 26.6–33.0)
MCHC: 33.8 g/dL (ref 31.5–35.7)
MCV: 91 fL (ref 79–97)
Monocytes Absolute: 0.4 10*3/uL (ref 0.1–0.9)
Monocytes: 15 %
NEUTROS ABS: 2.1 10*3/uL (ref 1.4–7.0)
Neutrophils: 74 %
Platelets: 298 10*3/uL (ref 150–379)
RBC: 4.61 x10E6/uL (ref 4.14–5.80)
RDW: 14.1 % (ref 12.3–15.4)
WBC: 2.9 10*3/uL — AB (ref 3.4–10.8)

## 2015-11-22 LAB — COMPREHENSIVE METABOLIC PANEL
ALK PHOS: 133 IU/L — AB (ref 39–117)
ALT: 31 IU/L (ref 0–44)
AST: 21 IU/L (ref 0–40)
Albumin/Globulin Ratio: 2.5 (ref 1.1–2.5)
Albumin: 4.7 g/dL (ref 3.5–5.5)
BUN/Creatinine Ratio: 19 (ref 9–20)
BUN: 20 mg/dL (ref 6–24)
Bilirubin Total: 0.6 mg/dL (ref 0.0–1.2)
CO2: 24 mmol/L (ref 18–29)
Calcium: 9.5 mg/dL (ref 8.7–10.2)
Chloride: 104 mmol/L (ref 96–106)
Creatinine, Ser: 1.06 mg/dL (ref 0.76–1.27)
GFR calc Af Amer: 93 mL/min/{1.73_m2} (ref >59)
GFR, EST NON AFRICAN AMERICAN: 80 mL/min/{1.73_m2} (ref >59)
Globulin, Total: 1.9 g/dL (ref 1.5–4.5)
Glucose: 98 mg/dL (ref 65–99)
Potassium: 5.4 mmol/L — ABNORMAL HIGH (ref 3.5–5.2)
SODIUM: 146 mmol/L — AB (ref 134–144)
Total Protein: 6.6 g/dL (ref 6.0–8.5)

## 2015-11-24 ENCOUNTER — Telehealth: Payer: Self-pay | Admitting: Neurology

## 2015-11-24 NOTE — Telephone Encounter (Signed)
I called the patient, The WBC is low as expected on the Gilenya, Absolute lymphocyte count is 0.3, stable. Sodium and potassium is elevated, ? Dehydration. The patient is to take in more fluids. Alk phos is high slightly, but is stable.

## 2015-12-12 ENCOUNTER — Ambulatory Visit (INDEPENDENT_AMBULATORY_CARE_PROVIDER_SITE_OTHER): Payer: Medicare Other | Admitting: Neurology

## 2015-12-12 ENCOUNTER — Telehealth: Payer: Self-pay | Admitting: Neurology

## 2015-12-12 DIAGNOSIS — R4182 Altered mental status, unspecified: Secondary | ICD-10-CM

## 2015-12-12 DIAGNOSIS — R404 Transient alteration of awareness: Secondary | ICD-10-CM

## 2015-12-12 DIAGNOSIS — R269 Unspecified abnormalities of gait and mobility: Secondary | ICD-10-CM

## 2015-12-12 DIAGNOSIS — G35 Multiple sclerosis: Secondary | ICD-10-CM

## 2015-12-12 DIAGNOSIS — N319 Neuromuscular dysfunction of bladder, unspecified: Secondary | ICD-10-CM

## 2015-12-12 NOTE — Procedures (Signed)
    History:  [name] is a 55 year old gentleman with a history of multiple sclerosis. Towards the end of January 2017 he had an event where he lost consciousness, losing about 20 minutes of time, associated with urinary incontinence. The patient is being evaluated for possible seizures. The above episode was unwitnessed.  This is a routine EEG. No skull defects are noted. Medications include baclofen, Celexa, Gilenya, Linzess, and Naprosyn.   EEG classification: Normal awake  Description of the recording: The background rhythms of this recording consists of a fairly well modulated medium amplitude alpha rhythm of 9 Hz that is reactive to eye opening and closure. As the record progresses, the patient appears to remain in the waking state throughout the recording. Photic stimulation was performed, resulting in a bilateral and symmetric photic driving response. Hyperventilation was also performed, resulting in a minimal buildup of the background rhythm activities without significant slowing seen. At no time during the recording does there appear to be evidence of spike or spike wave discharges or evidence of focal slowing. EKG monitor shows no evidence of cardiac rhythm abnormalities with a heart rate of 66.  Impression: This is a normal EEG recording in the waking state. No evidence of ictal or interictal discharges are seen.

## 2015-12-12 NOTE — Telephone Encounter (Signed)
I called the patient. The patient is having frequent episodes of sleep paralysis waking up in the morning. The usual have one episode week, but he is having daily episodes now. He has not had any further blackout episodes or lost time. He is to contact me if he has any further episodes. The EEG study done today was normal.

## 2016-05-20 ENCOUNTER — Encounter: Payer: Self-pay | Admitting: Neurology

## 2016-05-20 ENCOUNTER — Ambulatory Visit (INDEPENDENT_AMBULATORY_CARE_PROVIDER_SITE_OTHER): Payer: Medicare Other | Admitting: Neurology

## 2016-05-20 VITALS — BP 119/76 | HR 65 | Ht 70.0 in | Wt 175.0 lb

## 2016-05-20 DIAGNOSIS — R269 Unspecified abnormalities of gait and mobility: Secondary | ICD-10-CM

## 2016-05-20 DIAGNOSIS — G35D Multiple sclerosis, unspecified: Secondary | ICD-10-CM

## 2016-05-20 DIAGNOSIS — N319 Neuromuscular dysfunction of bladder, unspecified: Secondary | ICD-10-CM

## 2016-05-20 DIAGNOSIS — G35 Multiple sclerosis: Secondary | ICD-10-CM | POA: Diagnosis not present

## 2016-05-20 DIAGNOSIS — Z5181 Encounter for therapeutic drug level monitoring: Secondary | ICD-10-CM | POA: Diagnosis not present

## 2016-05-20 MED ORDER — BACLOFEN 10 MG PO TABS: ORAL_TABLET | ORAL | 3 refills | Status: DC

## 2016-05-20 NOTE — Progress Notes (Signed)
Reason for visit: Multiple sclerosis  [name] is an 55 y.o. male  History of present illness:  [name] is a 55 year old right-handed white male with a history of multiple sclerosis with chronic progressive features. The patient has brain and spinal cord lesions, he has significant spasticity in both legs, he is developing increasing dysfunction with the arms. He has some discomfort in the neck and shoulders, he is followed through the [facility] as well. He will be getting MRI evaluation of the cervical and thoracic spine in the near future. The patient has had some increasing difficulty with speech, he is choking mainly on solids on average 3 times a week. A swallow study done 2 years ago was unremarkable. The patient is on Gilenya, he is tolerating the medication quite well. He has not had any further events of altered awareness. He returns to this office for an evaluation.  Past Medical History:  Diagnosis Date  . Abnormality of gait 11/21/2015  . Asthma    childhood asthma  . Classic migraine   . Depression   . Dysphagia   . Hay fever   . MS (multiple sclerosis) (HCC)     Past Surgical History:  Procedure Laterality Date  . eye surgeries     x 2; bilateral 74 and 69    Family History  Problem Relation Age of Onset  . Lung cancer      parent  . Uterine cancer      other  . Cancer Father   . Multiple sclerosis Sister   . Seizures Maternal Uncle   . Parkinsonism Maternal Uncle   . Multiple sclerosis Sister   . Multiple sclerosis Paternal Uncle   . Multiple sclerosis Other     Social history:  reports that he has quit smoking. He has never used smokeless tobacco. He reports that he does not drink alcohol or use drugs.   No Known Allergies  Medications:  Prior to Admission medications   Medication Sig Start Date End Date Taking? Authorizing Provider  baclofen (LIORESAL) 10 MG tablet 1/2 tablet in the morning and 1.5 tablets at night 05/23/15   Mark Spaniel, MD  citalopram (CELEXA) 40 MG tablet Take 1 tablet (40 mg total) by mouth daily. 08/15/14   Mark Spaniel, MD  GILENYA 0.5 MG CAPS Take 1 capsule (0.5mg ) by mouth once daily 01/30/15   Mark Spaniel, MD  LINZESS 290 MCG CAPS capsule TAKE ONE CAPSULE BY MOUTH EVERY DAY 04/18/14   Mark Spaniel, MD  naproxen sodium (ANAPROX) 220 MG tablet Take 220 mg by mouth 2 (two) times daily with a meal.    Historical Provider, MD    ROS:  Out of a complete 14 system review of symptoms, the patient complains only of the following symptoms, and all other reviewed systems are negative.  Fatigue Runny nose, difficulty swallowing Eye itching, light sensitivity, eye pain Cough, wheezing, shortness of breath, choking Leg swelling Cold intolerance, heat intolerance Donald pain, constipation, nausea, incontinence of bowels Insomnia, daytime sleepiness Difficulty urinating, constant bladder, frequency of urination, urinary urgency Back pain, achy muscles, muscle cramps, walking difficulty, neck pain, neck stiffness Memory loss, dizziness, headache, numbness, speech difficulty, weakness, tremors Agitation, confusion, decreased concentration, depression  There were no vitals taken for this visit.  Physical Exam  General: The patient is alert and cooperative at the time of the examination.  Skin: 2+ edema below the knees is noted bilaterally..   Neurologic  Exam  Mental status: The patient is alert and oriented x 3 at the time of the examination. The patient has apparent normal recent and remote memory, with an apparently normal attention span and concentration ability.   Cranial nerves: Facial symmetry is present. Speech is normal, no aphasia or dysarthria is noted. Extraocular movements are full. The patient has mild bilateral INO's. Visual fields are full. Pupils are equal, round, and react to light. His are flat bilaterally.  Motor: The patient has 4/5 strength in the right upper  extremity, near normal strength on the left. The patient has increased motor tone in both lower extremities, he has better ability to abduct and abduct the left leg, minimal function of the right leg.  Sensory examination: Soft touch sensation is symmetric on the face, decreased on the right arm and leg relative to the left.  Coordination: The patient has dysmetria with finger-nose-finger with the upper extremities bilaterally, unable to perform heel-to-shin on either side.  Gait and station: The patient is wheelchair-bound, he is nonambulatory.  Reflexes: Deep tendon reflexes are symmetric, but the reflexes in the lower extremities are brisk relative to the arms.   Assessment/Plan:  1. Multiple sclerosis  2. Spastic quadriparesis  3. Neurogenic bladder  4. Reports of dysphagia  The patient has significant spasticity involving the lower extremities, we will go up on the baclofen dose taking 5 mg twice during the day, 20 mg at night. The patient may be a candidate for a baclofen pump in the future. We have discussed this issue. I also discussed the possibility of starting Ocrevus. The patient gets many of his medications through the [facility], he believes that this medication may be added to the formulary in the future. The patient will remain on Gilenya for now, we will check blood work. He will follow-up in 6 months. He continues to progress slowly with the upper extremity weakness and ataxia. MRI of the spine will be done through the [facility], I have asked him to have them send me a report.  [name] MD 05/20/2016 7:57 AM  [facility] [address] [place], [place] [address]  Phone [phone] Fax [phone]

## 2016-05-20 NOTE — Patient Instructions (Signed)
   We will go up on the baclofen to 5 mg (1/2 tablet) in the morning and at noon, and 2 tablets at night.

## 2016-05-21 LAB — COMPREHENSIVE METABOLIC PANEL
ALBUMIN: 4.6 g/dL (ref 3.5–5.5)
ALT: 41 IU/L (ref 0–44)
AST: 33 IU/L (ref 0–40)
Albumin/Globulin Ratio: 2.1 (ref 1.2–2.2)
Alkaline Phosphatase: 151 IU/L — ABNORMAL HIGH (ref 39–117)
BUN / CREAT RATIO: 20 (ref 9–20)
BUN: 20 mg/dL (ref 6–24)
Bilirubin Total: 0.7 mg/dL (ref 0.0–1.2)
CALCIUM: 9.7 mg/dL (ref 8.7–10.2)
CO2: 24 mmol/L (ref 18–29)
CREATININE: 1.01 mg/dL (ref 0.76–1.27)
Chloride: 101 mmol/L (ref 96–106)
GFR calc Af Amer: 98 mL/min/{1.73_m2} (ref >59)
GFR, EST NON AFRICAN AMERICAN: 85 mL/min/{1.73_m2} (ref >59)
GLUCOSE: 104 mg/dL — AB (ref 65–99)
Globulin, Total: 2.2 g/dL (ref 1.5–4.5)
Potassium: 4.9 mmol/L (ref 3.5–5.2)
Sodium: 142 mmol/L (ref 134–144)
TOTAL PROTEIN: 6.8 g/dL (ref 6.0–8.5)

## 2016-05-21 LAB — CBC WITH DIFFERENTIAL/PLATELET
Basophils Absolute: 0 10*3/uL (ref 0.0–0.2)
Basos: 0 %
EOS (ABSOLUTE): 0.1 10*3/uL (ref 0.0–0.4)
EOS: 2 %
HEMATOCRIT: 44 % (ref 37.5–51.0)
Hemoglobin: 14.5 g/dL (ref 12.6–17.7)
IMMATURE GRANS (ABS): 0 10*3/uL (ref 0.0–0.1)
Immature Granulocytes: 0 %
LYMPHS ABS: 0.3 10*3/uL — AB (ref 0.7–3.1)
LYMPHS: 9 %
MCH: 30.1 pg (ref 26.6–33.0)
MCHC: 33 g/dL (ref 31.5–35.7)
MCV: 91 fL (ref 79–97)
MONOCYTES: 18 %
Monocytes Absolute: 0.7 10*3/uL (ref 0.1–0.9)
NEUTROS ABS: 2.7 10*3/uL (ref 1.4–7.0)
Neutrophils: 71 %
Platelets: 281 10*3/uL (ref 150–379)
RBC: 4.82 x10E6/uL (ref 4.14–5.80)
RDW: 14.4 % (ref 12.3–15.4)
WBC: 3.8 10*3/uL (ref 3.4–10.8)

## 2016-05-28 ENCOUNTER — Telehealth: Payer: Self-pay | Admitting: Neurology

## 2016-05-28 NOTE — Telephone Encounter (Signed)
Returned call and spoke to pt. Let him know that there are no known drug interactions of Gilenya w/ OTC allergy medications. Has Allegra-D and regular Allegra available at home. Recommended that he try the regular Allegra for a few days. Will call back if symptoms do not improve. May also contact pharmacist for additional medication recommendations. Voiced appreciation for call.

## 2016-05-28 NOTE — Telephone Encounter (Signed)
Pt's wife called and would like to know what allergy medication he can try while taking Gilenya. Please call [phone]

## 2016-06-25 ENCOUNTER — Ambulatory Visit (INDEPENDENT_AMBULATORY_CARE_PROVIDER_SITE_OTHER): Payer: Medicare Other | Admitting: Family Medicine

## 2016-06-25 ENCOUNTER — Encounter: Payer: Self-pay | Admitting: Family Medicine

## 2016-06-25 VITALS — BP 112/84 | HR 67 | Temp 98.2°F | Ht 70.0 in

## 2016-06-25 DIAGNOSIS — J019 Acute sinusitis, unspecified: Secondary | ICD-10-CM

## 2016-06-25 MED ORDER — DOXYCYCLINE HYCLATE 100 MG PO CAPS: 100.0000 mg | ORAL_CAPSULE | Freq: Two times a day (BID) | ORAL | 0 refills | Status: DC

## 2016-06-25 NOTE — Patient Instructions (Signed)
BEFORE YOU LEAVE: -follow up: as needed if symptoms worsen or persist  Take the antibiotic as instructed.   Sinusitis, Adult Sinusitis is redness, soreness, and inflammation of the paranasal sinuses. Paranasal sinuses are air pockets within the bones of your face. They are located beneath your eyes, in the middle of your forehead, and above your eyes. In healthy paranasal sinuses, mucus is able to drain out, and air is able to circulate through them by way of your nose. However, when your paranasal sinuses are inflamed, mucus and air can become trapped. This can allow bacteria and other germs to grow and cause infection. Sinusitis can develop quickly and last only a short time (acute) or continue over a long period (chronic). Sinusitis that lasts for more than 12 weeks is considered chronic. CAUSES Causes of sinusitis include:  Allergies.  Structural abnormalities, such as displacement of the cartilage that separates your nostrils (deviated septum), which can decrease the air flow through your nose and sinuses and affect sinus drainage.  Functional abnormalities, such as when the small hairs (cilia) that line your sinuses and help remove mucus do not work properly or are not present. SIGNS AND SYMPTOMS Symptoms of acute and chronic sinusitis are the same. The primary symptoms are pain and pressure around the affected sinuses. Other symptoms include:  Upper toothache.  Earache.  Headache.  Bad breath.  Decreased sense of smell and taste.  A cough, which worsens when you are lying flat.  Fatigue.  Fever.  Thick drainage from your nose, which often is green and may contain pus (purulent).  Swelling and warmth over the affected sinuses. DIAGNOSIS Your health care provider will perform a physical exam. During your exam, your health care provider may perform any of the following to help determine if you have acute sinusitis or chronic sinusitis:  Look in your nose for signs of  abnormal growths in your nostrils (nasal polyps).  Tap over the affected sinus to check for signs of infection.  View the inside of your sinuses using an imaging device that has a light attached (endoscope). If your health care provider suspects that you have chronic sinusitis, one or more of the following tests may be recommended:  Allergy tests.  Nasal culture. A sample of mucus is taken from your nose, sent to a lab, and screened for bacteria.  Nasal cytology. A sample of mucus is taken from your nose and examined by your health care provider to determine if your sinusitis is related to an allergy. TREATMENT Most cases of acute sinusitis are related to a viral infection and will resolve on their own within 10 days. Sometimes, medicines are prescribed to help relieve symptoms of both acute and chronic sinusitis. These may include pain medicines, decongestants, nasal steroid sprays, or saline sprays. However, for sinusitis related to a bacterial infection, your health care provider will prescribe antibiotic medicines. These are medicines that will help kill the bacteria causing the infection. Rarely, sinusitis is caused by a fungal infection. In these cases, your health care provider will prescribe antifungal medicine. For some cases of chronic sinusitis, surgery is needed. Generally, these are cases in which sinusitis recurs more than 3 times per year, despite other treatments. HOME CARE INSTRUCTIONS  Drink plenty of water. Water helps thin the mucus so your sinuses can drain more easily.  Use a humidifier.  Inhale steam 3-4 times a day (for example, sit in the bathroom with the shower running).  Apply a warm, moist washcloth to your  face 3-4 times a day, or as directed by your health care provider.  Use saline nasal sprays to help moisten and clean your sinuses.  Take medicines only as directed by your health care provider.  If you were prescribed either an antibiotic or antifungal  medicine, finish it all even if you start to feel better. SEEK IMMEDIATE MEDICAL CARE IF:  You have increasing pain or severe headaches.  You have nausea, vomiting, or drowsiness.  You have swelling around your face.  You have vision problems.  You have a stiff neck.  You have difficulty breathing.   This information is not intended to replace advice given to you by your health care provider. Make sure you discuss any questions you have with your health care provider.   Document Released: 09/28/2005 Document Revised: 10/19/2014 Document Reviewed: 10/13/2011 Elsevier Interactive Patient Education [employer]

## 2016-06-25 NOTE — Progress Notes (Signed)
Pre visit review using our clinic review tool, if applicable. No additional management support is needed unless otherwise documented below in the visit note. 

## 2016-06-25 NOTE — Progress Notes (Signed)
HPI:  URI: -started: 3 weeks ago; wife reports he only told her the last few days -symptoms:thick nasal congestion, sore throat, cough, rattling when breaths sometimes, sinus pain, headache, dizziness sometimes, chills -denies:fever, chest pain,  NVD, tooth pain, rash -has tried: nothing -sick contacts/travel/risks: no reported flu, strep or tick exposure  ROS: See pertinent positives and negatives per HPI.  Past Medical History:  Diagnosis Date  . Abnormality of gait 11/21/2015  . Asthma    childhood asthma  . Classic migraine   . Depression   . Dysphagia   . Hay fever   . MS (multiple sclerosis) (HCC)     Past Surgical History:  Procedure Laterality Date  . eye surgeries     x 2; bilateral 74 and [age]    Family History  Problem Relation Age of Onset  . Cancer Father   . Multiple sclerosis Sister   . Seizures Maternal Uncle   . Parkinsonism Maternal Uncle   . Multiple sclerosis Sister   . Multiple sclerosis Paternal Uncle   . Multiple sclerosis Other   . Lung cancer      parent  . Uterine cancer      other    Social History   Social History  . Marital status: Married    Spouse name: joy  . Number of children: 2  . Years of education: college   Occupational History  . disabled    Social History Main Topics  . Smoking status: Former Games developermoker  . Smokeless tobacco: Never Used  . Alcohol use No     Comment: rare   . Drug use: No  . Sexual activity: Not Asked   Other Topics Concern  . None   Social History Narrative   Lives at home, married   Patient is right handed.   Patient drinks 1 cup caffeine daily.     Current Outpatient Prescriptions:  .  baclofen (LIORESAL) 10 MG tablet, 1/2 tablet in the morning and at noon, and 2 tablets at night, Disp: 270 each, Rfl: 3 .  citalopram (CELEXA) 40 MG tablet, Take 1 tablet (40 mg total) by mouth daily., Disp: 90 tablet, Rfl: 1 .  GILENYA 0.5 MG CAPS, Take 1 capsule (0.5mg ) by mouth once daily, Disp: 30  capsule, Rfl: 6 .  LINZESS 290 MCG CAPS capsule, TAKE ONE CAPSULE BY MOUTH EVERY DAY, Disp: 30 capsule, Rfl: 5 .  naproxen sodium (ANAPROX) 220 MG tablet, Take 220 mg by mouth 2 (two) times daily with a meal., Disp: , Rfl:  .  doxycycline (VIBRAMYCIN) 100 MG capsule, Take 1 capsule (100 mg total) by mouth 2 (two) times daily., Disp: 20 capsule, Rfl: 0  EXAM:  Vitals:   06/25/16 1257  BP: 112/84  Pulse: 67  Temp: 98.2 F (36.8 C)    There is no height or weight on file to calculate BMI.  GENERAL: vitals reviewed and listed above, alert, oriented, appears well hydrated and in no acute distress  HEENT: atraumatic, conjunttiva clear, no obvious abnormalities on inspection of external nose and ears, normal appearance of ear canals and TMs, R>L thick nasal congestion, mild post oropharyngeal erythema with PND, no tonsillar edema or exudate, no sinus TTP  NECK: no obvious masses on inspection  LUNGS: clear to auscultation bilaterally, no wheezes, rales or rhonchi, good air movement  CV: HRRR, no peripheral edema  MS: moves all extremities without noticeable abnormality  PSYCH: pleasant and cooperative, no obvious depression or anxiety  ASSESSMENT AND  PLAN:  Discussed the following assessment and plan:  Acute sinusitis, recurrence not specified, unspecified location   We discussed potential etiologies, with sinusitis being most likely, and opted to treat with doxy. We discussed treatment side effects, likely course, transmission, and signs of developing a serious illness. -of course, we advised to return or notify a doctor immediately if symptoms worsen or persist or new concerns arise.    Patient Instructions  BEFORE YOU LEAVE: -follow up: as needed if symptoms worsen or persist  Take the antibiotic as instructed.   Sinusitis, Adult Sinusitis is redness, soreness, and inflammation of the paranasal sinuses. Paranasal sinuses are air pockets within the bones of your face.  They are located beneath your eyes, in the middle of your forehead, and above your eyes. In healthy paranasal sinuses, mucus is able to drain out, and air is able to circulate through them by way of your nose. However, when your paranasal sinuses are inflamed, mucus and air can become trapped. This can allow bacteria and other germs to grow and cause infection. Sinusitis can develop quickly and last only a short time (acute) or continue over a long period (chronic). Sinusitis that lasts for more than 12 weeks is considered chronic. CAUSES Causes of sinusitis include:  Allergies.  Structural abnormalities, such as displacement of the cartilage that separates your nostrils (deviated septum), which can decrease the air flow through your nose and sinuses and affect sinus drainage.  Functional abnormalities, such as when the small hairs (cilia) that line your sinuses and help remove mucus do not work properly or are not present. SIGNS AND SYMPTOMS Symptoms of acute and chronic sinusitis are the same. The primary symptoms are pain and pressure around the affected sinuses. Other symptoms include:  Upper toothache.  Earache.  Headache.  Bad breath.  Decreased sense of smell and taste.  A cough, which worsens when you are lying flat.  Fatigue.  Fever.  Thick drainage from your nose, which often is green and may contain pus (purulent).  Swelling and warmth over the affected sinuses. DIAGNOSIS Your health care provider will perform a physical exam. During your exam, your health care provider may perform any of the following to help determine if you have acute sinusitis or chronic sinusitis:  Look in your nose for signs of abnormal growths in your nostrils (nasal polyps).  Tap over the affected sinus to check for signs of infection.  View the inside of your sinuses using an imaging device that has a light attached (endoscope). If your health care provider suspects that you have chronic  sinusitis, one or more of the following tests may be recommended:  Allergy tests.  Nasal culture. A sample of mucus is taken from your nose, sent to a lab, and screened for bacteria.  Nasal cytology. A sample of mucus is taken from your nose and examined by your health care provider to determine if your sinusitis is related to an allergy. TREATMENT Most cases of acute sinusitis are related to a viral infection and will resolve on their own within 10 days. Sometimes, medicines are prescribed to help relieve symptoms of both acute and chronic sinusitis. These may include pain medicines, decongestants, nasal steroid sprays, or saline sprays. However, for sinusitis related to a bacterial infection, your health care provider will prescribe antibiotic medicines. These are medicines that will help kill the bacteria causing the infection. Rarely, sinusitis is caused by a fungal infection. In these cases, your health care provider will prescribe antifungal medicine.  For some cases of chronic sinusitis, surgery is needed. Generally, these are cases in which sinusitis recurs more than 3 times per year, despite other treatments. HOME CARE INSTRUCTIONS  Drink plenty of water. Water helps thin the mucus so your sinuses can drain more easily.  Use a humidifier.  Inhale steam 3-4 times a day (for example, sit in the bathroom with the shower running).  Apply a warm, moist washcloth to your face 3-4 times a day, or as directed by your health care provider.  Use saline nasal sprays to help moisten and clean your sinuses.  Take medicines only as directed by your health care provider.  If you were prescribed either an antibiotic or antifungal medicine, finish it all even if you start to feel better. SEEK IMMEDIATE MEDICAL CARE IF:  You have increasing pain or severe headaches.  You have nausea, vomiting, or drowsiness.  You have swelling around your face.  You have vision problems.  You have a stiff  neck.  You have difficulty breathing.   This information is not intended to replace advice given to you by your health care provider. Make sure you discuss any questions you have with your health care provider.   Document Released: 09/28/2005 Document Revised: 10/19/2014 Document Reviewed: 10/13/2011 Elsevier Interactive Patient Education [address].     [place], Dahlia Client R., DO

## 2016-07-13 ENCOUNTER — Telehealth: Payer: Self-pay | Admitting: Neurology

## 2016-07-13 NOTE — Telephone Encounter (Signed)
A disc of an MRI of the cervical spine was brought for my review the study was done on 05/27/2016. This shows evidence of multilevel cervical cord lesions, most prominent at the C3 and C4 levels. There appears to be some spinal cord atrophy, some brainstem involvement as well. Changes appear to be chronic. There is a C4-5 disc bulge without significant spinal cord compression.

## 2016-11-25 ENCOUNTER — Ambulatory Visit (INDEPENDENT_AMBULATORY_CARE_PROVIDER_SITE_OTHER): Payer: Medicare Other | Admitting: Neurology

## 2016-11-25 ENCOUNTER — Encounter: Payer: Self-pay | Admitting: Neurology

## 2016-11-25 VITALS — BP 127/81 | HR 69

## 2016-11-25 DIAGNOSIS — R269 Unspecified abnormalities of gait and mobility: Secondary | ICD-10-CM

## 2016-11-25 DIAGNOSIS — N319 Neuromuscular dysfunction of bladder, unspecified: Secondary | ICD-10-CM | POA: Diagnosis not present

## 2016-11-25 DIAGNOSIS — G35 Multiple sclerosis: Secondary | ICD-10-CM

## 2016-11-25 DIAGNOSIS — Z5181 Encounter for therapeutic drug level monitoring: Secondary | ICD-10-CM | POA: Diagnosis not present

## 2016-11-25 MED ORDER — DIAZEPAM 2 MG PO TABS: 2.0000 mg | ORAL_TABLET | Freq: Three times a day (TID) | ORAL | 3 refills | Status: DC

## 2016-11-25 NOTE — Progress Notes (Signed)
Reason for visit: Multiple sclerosis  [name] is an 56 y.o. male  History of present illness:  [name] is a 56 year old right-handed white male with a history of multiple sclerosis. The patient has had gradually worsening ataxia involving the upper extremities, he has spasticity involving the legs. The patient has a neurogenic bladder, but he has been placed on Flomax which seemed to help significantly. He has developed what sounds like laryngeal spasm episodes, he may have difficulty breathing during the events, this is occurring on average about once a week. The patient has undergone pulmonary function tests through the [facility] for problems with shortness of breath associated with minimal physical activity. He was told that there was some dysfunction of the left lower lobe of the lung, what sounds like a possible partial paralysis of the diaphragm on that side. The patient has not had any falls. He continues to progress slowly.  Past Medical History:  Diagnosis Date  . Abnormality of gait 11/21/2015  . Asthma    childhood asthma  . Classic migraine   . Depression   . Dysphagia   . Hay fever   . MS (multiple sclerosis) (HCC)     Past Surgical History:  Procedure Laterality Date  . eye surgeries     x 2; bilateral 74 and 89    Family History  Problem Relation Age of Onset  . Cancer Father   . Multiple sclerosis Sister   . Seizures Maternal Uncle   . Parkinsonism Maternal Uncle   . Multiple sclerosis Sister   . Multiple sclerosis Paternal Uncle   . Multiple sclerosis Other   . Lung cancer      parent  . Uterine cancer      other    Social history:  reports that he has quit smoking. He has never used smokeless tobacco. He reports that he does not drink alcohol or use drugs.   No Known Allergies  Medications:  Prior to Admission medications   Medication Sig Start Date End Date Taking? Authorizing Provider  baclofen (LIORESAL) 10 MG tablet 1/2 tablet  in the morning and at noon, and 2 tablets at night 05/20/16  Yes York Spaniel, MD  citalopram (CELEXA) 40 MG tablet Take 1 tablet (40 mg total) by mouth daily. 08/15/14  Yes York Spaniel, MD  GILENYA 0.5 MG CAPS Take 1 capsule (0.5mg ) by mouth once daily 01/30/15  Yes York Spaniel, MD  LINZESS 290 MCG CAPS capsule TAKE ONE CAPSULE BY MOUTH EVERY DAY 04/18/14  Yes York Spaniel, MD  naproxen sodium (ANAPROX) 220 MG tablet Take 220 mg by mouth 2 (two) times daily with a meal.   Yes Historical Provider, MD  tamsulosin (FLOMAX) 0.4 MG CAPS capsule Take 0.4 mg by mouth daily.   Yes Historical Provider, MD  diazepam (VALIUM) 2 MG tablet Take 1 tablet (2 mg total) by mouth 3 (three) times daily. 11/25/16   York Spaniel, MD    ROS:  Out of a complete 14 system review of symptoms, the patient complains only of the following symptoms, and all other reviewed systems are negative.  Chills Difficulty swallowing Light sensitivity, eye pain Wheezing, shortness of breath, choking Heat intolerance Constipation Restless legs, daytime sleepiness Difficulty urinating Dizziness, headache, numbness, speech difficulty, weakness, tremors Decreased concentration, depression  Blood pressure 127/81, pulse 69.  Physical Exam  General: The patient is alert and cooperative at the time of the examination.  Skin: 2+  edema below the knees is noted bilaterally.   Neurologic Exam  Mental status: The patient is alert and oriented x 3 at the time of the examination. The patient has apparent normal recent and remote memory, with an apparently normal attention span and concentration ability.   Cranial nerves: Facial symmetry is present. Speech is slightly ataxic, not aphasic. Extraocular movements are full. The patient has evidence of mild bilateral  INO. Visual fields are full. Pupils are equal, round, and reactive to light. Discs are flat bilaterally.  Motor: The patient has good strength in the upper  extremities, with exception of some weakness of the left deltoid. There is increased motor tone in both lower extremities, diffuse 3/5 strength bilaterally.  Sensory examination: Soft touch sensation is symmetric on the face, and arms, decreased on the right leg as compared to the left.  Coordination: The patient significant ataxia with finger-nose-finger bilaterally, patient is unable to perform heel-to-shin on either side.  Gait and station: The patient is wheelchair-bound, he is unable to ambulate.  Reflexes: Deep tendon reflexes are symmetric.   Assessment/Plan:  1. Multiple sclerosis  2. Spastic quadriparesis  3. Neurogenic bladder  The patient has done fairly well with the bowel and bladder function, the use of Linzess has been helpful. Flomax has helped his bladder. The patient is having what sounds like episodes of laryngeal spasm, he is also having some problems with choking with swallowing. The patient will have diazepam added to the baclofen, starting on a 2 mg 3 times daily dose. If he is tolerating this but is still having problems, he is to contact our office for a dose increase. He will have blood work for the Gilenya today, he will follow-up in 6 months.  [name] MD 11/25/2016 7:54 AM  [facility] [address] [place], [place] [address]  Phone [phone] Fax [phone]

## 2016-11-25 NOTE — Patient Instructions (Signed)
We will try diazepam for spasticity, look out for drowsiness.

## 2016-11-26 LAB — CBC WITH DIFFERENTIAL/PLATELET
Basophils Absolute: 0 10*3/uL (ref 0.0–0.2)
Basos: 1 %
EOS (ABSOLUTE): 0.1 10*3/uL (ref 0.0–0.4)
EOS: 2 %
Hematocrit: 43 % (ref 37.5–51.0)
Hemoglobin: 14.7 g/dL (ref 13.0–17.7)
IMMATURE GRANS (ABS): 0 10*3/uL (ref 0.0–0.1)
IMMATURE GRANULOCYTES: 0 %
Lymphocytes Absolute: 0.3 10*3/uL — ABNORMAL LOW (ref 0.7–3.1)
Lymphs: 10 %
MCH: 30.2 pg (ref 26.6–33.0)
MCHC: 34.2 g/dL (ref 31.5–35.7)
MCV: 89 fL (ref 79–97)
MONOS ABS: 0.5 10*3/uL (ref 0.1–0.9)
Monocytes: 15 %
NEUTROS PCT: 72 %
Neutrophils Absolute: 2.4 10*3/uL (ref 1.4–7.0)
Platelets: 266 10*3/uL (ref 150–379)
RBC: 4.86 x10E6/uL (ref 4.14–5.80)
RDW: 14.1 % (ref 12.3–15.4)
WBC: 3.3 10*3/uL — ABNORMAL LOW (ref 3.4–10.8)

## 2016-11-26 LAB — COMPREHENSIVE METABOLIC PANEL
A/G RATIO: 1.9 (ref 1.2–2.2)
ALBUMIN: 4.6 g/dL (ref 3.5–5.5)
ALT: 31 IU/L (ref 0–44)
AST: 23 IU/L (ref 0–40)
Alkaline Phosphatase: 147 IU/L — ABNORMAL HIGH (ref 39–117)
BUN / CREAT RATIO: 16 (ref 9–20)
BUN: 19 mg/dL (ref 6–24)
Bilirubin Total: 0.6 mg/dL (ref 0.0–1.2)
CALCIUM: 9.6 mg/dL (ref 8.7–10.2)
CO2: 25 mmol/L (ref 18–29)
Chloride: 100 mmol/L (ref 96–106)
Creatinine, Ser: 1.22 mg/dL (ref 0.76–1.27)
GFR calc Af Amer: 78 mL/min/{1.73_m2} (ref >59)
GFR, EST NON AFRICAN AMERICAN: 67 mL/min/{1.73_m2} (ref >59)
Globulin, Total: 2.4 g/dL (ref 1.5–4.5)
Glucose: 96 mg/dL (ref 65–99)
Potassium: 5.1 mmol/L (ref 3.5–5.2)
Sodium: 141 mmol/L (ref 134–144)
TOTAL PROTEIN: 7 g/dL (ref 6.0–8.5)

## 2017-03-02 ENCOUNTER — Telehealth: Payer: Self-pay | Admitting: *Deleted

## 2017-03-02 NOTE — Telephone Encounter (Signed)
Gave completed/signed DMV form to medical records to process.

## 2017-05-27 ENCOUNTER — Telehealth: Payer: Self-pay | Admitting: *Deleted

## 2017-05-27 ENCOUNTER — Encounter: Payer: Self-pay | Admitting: Neurology

## 2017-05-27 ENCOUNTER — Ambulatory Visit (INDEPENDENT_AMBULATORY_CARE_PROVIDER_SITE_OTHER): Payer: Medicare Other | Admitting: Neurology

## 2017-05-27 VITALS — BP 114/84 | HR 65 | Ht 70.0 in

## 2017-05-27 DIAGNOSIS — G35 Multiple sclerosis: Secondary | ICD-10-CM | POA: Diagnosis not present

## 2017-05-27 DIAGNOSIS — R269 Unspecified abnormalities of gait and mobility: Secondary | ICD-10-CM

## 2017-05-27 DIAGNOSIS — Z5181 Encounter for therapeutic drug level monitoring: Secondary | ICD-10-CM | POA: Diagnosis not present

## 2017-05-27 DIAGNOSIS — N319 Neuromuscular dysfunction of bladder, unspecified: Secondary | ICD-10-CM | POA: Diagnosis not present

## 2017-05-27 DIAGNOSIS — F482 Pseudobulbar affect: Secondary | ICD-10-CM | POA: Insufficient documentation

## 2017-05-27 DIAGNOSIS — J385 Laryngeal spasm: Secondary | ICD-10-CM | POA: Diagnosis not present

## 2017-05-27 HISTORY — DX: Pseudobulbar affect: F48.2

## 2017-05-27 MED ORDER — DEXTROMETHORPHAN-QUINIDINE 20-10 MG PO CAPS: 1.0000 | ORAL_CAPSULE | Freq: Two times a day (BID) | ORAL | 3 refills | Status: DC

## 2017-05-27 MED ORDER — DIAZEPAM 5 MG PO TABS: 5.0000 mg | ORAL_TABLET | Freq: Three times a day (TID) | ORAL | 5 refills | Status: DC

## 2017-05-27 NOTE — Progress Notes (Signed)
Reason for visit: Multiple sclerosis  [name] is an 56 y.o. male  History of present illness:  [name] is a 56 year old right-handed white male with a history of multiple sclerosis. The patient has had ongoing progression of his disease, he remains on Gilenya. The patient has noted some difficulty with using the arms and hands, he has severe ataxia with the use of his arms. The patient began having a headache over the last week, the headaches are daily in nature in the right frontal area. The patient also has some neck pain and neck discomfort but the headaches do not come up from the neck. The patient has some photophobia with the headache, no nausea or vomiting. The patient has noted some increased problems with supporting his weight when he stands up with assistance. The patient has continued to have severe episodes of laryngeal spasm to the point where he may lose consciousness. He has been placed on diazepam without much benefit, taking 2 mg 3 times daily. The patient returns for an evaluation. He has a neurogenic bowel and bladder problem, he has entered a regimen that has helped him reduce episodes of incontinence. He is having episodes of inappropriate laughing.  Past Medical History:  Diagnosis Date  . Abnormality of gait 11/21/2015  . Asthma    childhood asthma  . Classic migraine   . Depression   . Dysphagia   . Hay fever   . MS (multiple sclerosis) (HCC)   . 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 eye surgeries     x 2; bilateral 74 and 16    Family History  Problem Relation Age of Onset  . Cancer Father   . Multiple sclerosis Sister   . Seizures Maternal Uncle   . Parkinsonism Maternal Uncle   . Multiple sclerosis Sister   . Multiple sclerosis Paternal Uncle   . Multiple sclerosis Other   . Lung cancer Unknown        parent  . Uterine cancer Unknown        other    Social history:  reports that he  has quit smoking. He has never used smokeless tobacco. He reports that he does not drink alcohol or use drugs.   No Known Allergies  Medications:  Prior to Admission medications   Medication Sig Start Date End Date Taking? Authorizing Provider  baclofen (LIORESAL) 10 MG tablet 1/2 tablet in the morning and at noon, and 2 tablets at night 05/20/16  Yes York Spaniel, MD  citalopram (CELEXA) 40 MG tablet Take 1 tablet (40 mg total) by mouth daily. 08/15/14  Yes York Spaniel, MD  diazepam (VALIUM) 2 MG tablet Take 1 tablet (2 mg total) by mouth 3 (three) times daily. 11/25/16  Yes York Spaniel, MD  GILENYA 0.5 MG CAPS Take 1 capsule (0.5mg ) by mouth once daily 01/30/15  Yes York Spaniel, MD  LINZESS 290 MCG CAPS capsule TAKE ONE CAPSULE BY MOUTH EVERY DAY 04/18/14  Yes York Spaniel, MD  naproxen sodium (ANAPROX) 220 MG tablet Take 220 mg by mouth 2 (two) times daily with a meal.   Yes [provider]    ROS:  Out of a complete 14 system review of symptoms, the patient complains only of the following symptoms, and all other reviewed systems are negative.  Fatigue, weight change Difficulty swallowing Eye pain Wheezing, shortness of breath Heat intolerance Incontinence of the  bowels Daytime sleepiness incontinence of the bladder Urinary urgency Back pain, achy muscles, muscle cramps, walking difficulty, neck pain, neck stiffness Memory loss, dizziness, headache, numbness, speech difficulty, weakness, tremors, passing out Confusion, depression   Blood pressure 114/84, pulse 65, height 5\' 10"  (1.778 m), SpO2 95 %.  Physical Exam  General: The patient is alert and cooperative at the time of the examination.  Skin: No significant peripheral edema is noted.   Neurologic Exam  Mental status: The patient is alert and oriented x 3 at the time of the examination. The patient has apparent normal recent and remote memory, with an apparently normal attention span  and concentration ability.   Cranial nerves: Facial symmetry is present. Speech is normal, no aphasia or dysarthria is noted. Extraocular movements are full. Visual fields are full.  Motor: The patient has good strength in all 4 extremities.  Sensory examination: Soft touch sensation is symmetric on the face, arms, and legs.  Coordination: The patient has good finger-nose-finger and heel-to-shin bilaterally.  Gait and station: The patient has a normal gait. Tandem gait is normal. Romberg is negative. No drift is seen.  Reflexes: Deep tendon reflexes are symmetric.   Assessment/Plan:  1. Multiple sclerosis  2. Gait disorder  3. Neurogenic bowel and bladder  4. Pseudobulbar affect  5. Laryngeal spasm, severe  The patient is having severe episodes of laryngeal spasm, he may lose consciousness with the events. The diazepam will be increased to 5 mg 3 times daily, he will have a referral to ENT, I would question whether Botox or other therapies may help him. The patient will be placed on Neudexta for the pseudobulbar affect. We have discussed the possibility of switching to Ocrevus as his MS symptoms have continued to progress on Gilenya. I have given the patient information regarding this, blood work will be done today. If the headaches continue, a trial on prednisone may be of some benefit. The last MRI of the brain was done in 2015, this may need to be repeated soon. The patient will contact me if he decides to go on Ocrevus.   [name] MD 05/27/2017 10:46 AM  [facility] [address] [place], [place] [address]  Phone [phone] Fax [phone]

## 2017-05-27 NOTE — Telephone Encounter (Signed)
Faxed printed/signed rx diazepam to [facility] at [phone]. Received confirmation.

## 2017-05-27 NOTE — Telephone Encounter (Signed)
Submitted PA Nudexta on covermymeds. [place], Rx #: [id number] ([employer] Medicare Part D). Awaiting response.

## 2017-05-27 NOTE — Patient Instructions (Signed)
   We will go up on the diazepam 5 mg three times a day.  We will start Neudexta for the pseudobulbar affect.   We will get an ENT evaluation.

## 2017-05-27 NOTE — Telephone Encounter (Signed)
Received fax notification from [employer] that NUEDEXTA CAP 20-10MG  is approved through 10/11/2017. For further questions, call [phone].  Faxed notification to [facility] [place], [place] at [phone]. Received confirmation.

## 2017-05-28 LAB — CBC WITH DIFFERENTIAL/PLATELET
BASOS: 0 %
Basophils Absolute: 0 10*3/uL (ref 0.0–0.2)
EOS (ABSOLUTE): 0 10*3/uL (ref 0.0–0.4)
EOS: 1 %
HEMATOCRIT: 44.2 % (ref 37.5–51.0)
Hemoglobin: 15 g/dL (ref 13.0–17.7)
IMMATURE GRANS (ABS): 0 10*3/uL (ref 0.0–0.1)
Immature Granulocytes: 0 %
LYMPHS: 15 %
Lymphocytes Absolute: 0.6 10*3/uL — ABNORMAL LOW (ref 0.7–3.1)
MCH: 30.4 pg (ref 26.6–33.0)
MCHC: 33.9 g/dL (ref 31.5–35.7)
MCV: 90 fL (ref 79–97)
MONOCYTES: 6 %
Monocytes Absolute: 0.2 10*3/uL (ref 0.1–0.9)
Neutrophils Absolute: 2.7 10*3/uL (ref 1.4–7.0)
Neutrophils: 78 %
Platelets: 269 10*3/uL (ref 150–379)
RBC: 4.93 x10E6/uL (ref 4.14–5.80)
RDW: 14.5 % (ref 12.3–15.4)
WBC: 3.6 10*3/uL (ref 3.4–10.8)

## 2017-05-28 LAB — HEPATITIS B CORE ANTIBODY, TOTAL: HEP B C TOTAL AB: NEGATIVE

## 2017-05-28 LAB — COMPREHENSIVE METABOLIC PANEL
ALT: 25 IU/L (ref 0–44)
AST: 25 IU/L (ref 0–40)
Albumin/Globulin Ratio: 2 (ref 1.2–2.2)
Albumin: 4.8 g/dL (ref 3.5–5.5)
Alkaline Phosphatase: 126 IU/L — ABNORMAL HIGH (ref 39–117)
BUN/Creatinine Ratio: 17 (ref 9–20)
BUN: 19 mg/dL (ref 6–24)
Bilirubin Total: 0.8 mg/dL (ref 0.0–1.2)
CO2: 23 mmol/L (ref 20–29)
Calcium: 9.9 mg/dL (ref 8.7–10.2)
Chloride: 104 mmol/L (ref 96–106)
Creatinine, Ser: 1.09 mg/dL (ref 0.76–1.27)
GFR calc Af Amer: 88 mL/min/{1.73_m2} (ref >59)
GFR, EST NON AFRICAN AMERICAN: 77 mL/min/{1.73_m2} (ref >59)
GLOBULIN, TOTAL: 2.4 g/dL (ref 1.5–4.5)
Glucose: 90 mg/dL (ref 65–99)
Potassium: 4.8 mmol/L (ref 3.5–5.2)
Sodium: 143 mmol/L (ref 134–144)
Total Protein: 7.2 g/dL (ref 6.0–8.5)

## 2017-05-28 LAB — VARICELLA ZOSTER ANTIBODY, IGG: Varicella zoster IgG: 2660 index (ref >165)

## 2017-05-28 LAB — HEPATITIS B SURFACE ANTIGEN: HEP B S AG: NEGATIVE

## 2017-05-28 LAB — HEPATITIS C ANTIBODY

## 2017-06-01 LAB — QUANTIFERON IN TUBE
QFT TB AG MINUS NIL VALUE: 0 [IU]/mL
QUANTIFERON MITOGEN VALUE: 1.14 IU/mL
QUANTIFERON TB AG VALUE: 0.02 IU/mL
QUANTIFERON TB GOLD: NEGATIVE
Quantiferon Nil Value: 0.02 IU/mL

## 2017-06-01 LAB — QUANTIFERON TB GOLD ASSAY (BLOOD)

## 2017-06-04 NOTE — Telephone Encounter (Signed)
Pt calling to inform that [facility] did not get the prescription for the Diazepam, can you please refax.  Pt has not asked for a call back

## 2017-06-04 NOTE — Telephone Encounter (Signed)
Re-faxed printed rx as requested to [facility]. Fax: [phone]. Received confirmation.

## 2017-06-18 DIAGNOSIS — Z993 Dependence on wheelchair: Secondary | ICD-10-CM | POA: Diagnosis not present

## 2017-06-18 DIAGNOSIS — G35 Multiple sclerosis: Secondary | ICD-10-CM | POA: Diagnosis not present

## 2017-06-18 DIAGNOSIS — H6121 Impacted cerumen, right ear: Secondary | ICD-10-CM | POA: Diagnosis not present

## 2017-06-18 DIAGNOSIS — J387 Other diseases of larynx: Secondary | ICD-10-CM | POA: Diagnosis not present

## 2017-06-18 DIAGNOSIS — R0602 Shortness of breath: Secondary | ICD-10-CM | POA: Diagnosis not present

## 2017-07-01 ENCOUNTER — Encounter: Payer: Self-pay | Admitting: Family Medicine

## 2017-08-13 DIAGNOSIS — G35 Multiple sclerosis: Secondary | ICD-10-CM | POA: Insufficient documentation

## 2017-08-13 DIAGNOSIS — R0600 Dyspnea, unspecified: Secondary | ICD-10-CM | POA: Diagnosis not present

## 2017-08-17 ENCOUNTER — Other Ambulatory Visit (HOSPITAL_COMMUNITY): Payer: Self-pay | Admitting: Respiratory Therapy

## 2017-08-17 DIAGNOSIS — R0602 Shortness of breath: Secondary | ICD-10-CM

## 2017-08-17 DIAGNOSIS — J9610 Chronic respiratory failure, unspecified whether with hypoxia or hypercapnia: Secondary | ICD-10-CM

## 2017-08-17 DIAGNOSIS — G35 Multiple sclerosis: Secondary | ICD-10-CM

## 2017-08-26 ENCOUNTER — Ambulatory Visit (HOSPITAL_COMMUNITY)
Admission: RE | Admit: 2017-08-26 | Discharge: 2017-08-26 | Disposition: A | Discharge status: Home / Self Care | Payer: Medicare Other | Source: Ambulatory Visit | Attending: Otolaryngology | Admitting: Otolaryngology

## 2017-08-26 ENCOUNTER — Ambulatory Visit (HOSPITAL_COMMUNITY): Payer: Medicare Other

## 2017-08-26 DIAGNOSIS — R0600 Dyspnea, unspecified: Secondary | ICD-10-CM | POA: Insufficient documentation

## 2017-08-26 DIAGNOSIS — G35 Multiple sclerosis: Secondary | ICD-10-CM | POA: Insufficient documentation

## 2017-08-26 NOTE — Procedures (Signed)
Objective Swallowing Evaluation: Type of Study: FEES-Fiberoptic Endoscopic Evaluation of Swallow   Patient Details  Name: [name] MRN: [id number] Date of Birth: Jan 20, 1961  Today's Date: 08/26/2017 Time: SLP Start Time (ACUTE ONLY): 1000 -SLP Stop Time (ACUTE ONLY): 1107  SLP Time Calculation (min) (ACUTE ONLY): 67 min   Past Medical History:  Past Medical History:  Diagnosis Date  . Abnormality of gait 11/21/2015  . Asthma    childhood asthma  . Classic migraine   . Depression   . Dysphagia   . Hay fever   . MS (multiple sclerosis) (HCC)   . 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 eye surgeries     x 2; bilateral 74 and 76   HPI: Pt is a 56 year old arriving for an outpatient FEES. Pt was referred by ENT but is also seeing Dr. Anne [name]. 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 Pt was referred to ENT due to laryngospasms. When this problem began pt reports he lost consciousness, but after learning a "sniff/hiss" maneuver (hard rapid inhalation and slow exhalation) he has been able to avoid loss of consciouness and release the spasm. Pt an wife report the pt often has one of these events while drinking; he will get choked, cough and then go into laryngospasm. They occur about twice a week. Pt reports he is able to masticate well, but does need a lidded, handled cup with straw to avoid spilling or anterior spillage. He reports rare occasions of drooling when questioned. He denies reflux. Pt had an MBS in 2015 that showed minimal deficits, only a slight delay in timing though SLP suspected fatigue could be a factor in pts frequent coughing episodes. He was recommended to f/u with OP SLP, but per the pts report he has not seen an SLP.    No Data Recorded   Assessment / Plan / Recommendation  CHL IP CLINICAL IMPRESSIONS 08/26/2017   Clinical Impression Pt demonstrates a mild pharyngeal dysphagia characterized by slightly incomplete laryngeal closure as the bolus passes the airway. Visual assessment of larynx showed slight movement of adduction on exhalation, otherwise WNL. Initially liquids are not clearly observed in the vestibule or subglottis, but one instance of immediate coughing post swallow raised suspicion for aspiration. At that point pt and wife identified signs of possible impending laryngospasm, but when given a short rest break pt felt fine and did not spasm. Subsequent trials revealed trace silent aspiration during the swallow of thin liquids. Pt seemed to have slightly decreased range of motion during the act of swallowing than during volitional motor tasks (breath hold, sustained EEE, etc. ) Most effective strategy to improve airway closure before and during the swallow was a cue to take a small sips, hold it, hold his breath and then swallow (chin tuck ineffective and difficult for pt). Pt visibly sealed arytenoids and deflected the epiglottis completely for a sustained period. Strongly suspect that mild pharyngeal neuromuscular spacticity is resulting in trace aspiration events and resulting in laryngospasm with cough reflex.  Pt can use compensatory maneuvers to better protect airway but will need f/u OP SLP therapy to target respiratory muscle strength and laryngeal adduction. Pt very motivated to use therapy to maintain function and avoid medical decline.   SLP Visit Diagnosis Dysphagia, pharyngeal phase (R13.13)  Attention and concentration deficit following --  Frontal lobe and executive  function deficit following --  Impact on safety and function Moderate aspiration risk      CHL IP TREATMENT RECOMMENDATION 08/26/2017  Treatment Recommendations Defer treatment plan to f/u with SLP     No flowsheet data found.  CHL IP DIET RECOMMENDATION 08/26/2017  SLP Diet Recommendations Regular solids;Thin liquid   Liquid Administration via Straw  Medication Administration Whole meds with puree  Compensations Slow rate;Small sips/bites  Postural Changes Seated upright at 90 degrees      CHL IP OTHER RECOMMENDATIONS 08/26/2017  Recommended Consults --  Oral Care Recommendations Oral care BID  Other Recommendations --      CHL IP FOLLOW UP RECOMMENDATIONS 08/26/2017  Follow up Recommendations Outpatient SLP      No flowsheet data found.         CHL IP ORAL PHASE 08/26/2017  Oral Phase WFL  Oral - Pudding Teaspoon --  Oral - Pudding Cup --  Oral - Honey Teaspoon --  Oral - Honey Cup --  Oral - Nectar Teaspoon --  Oral - Nectar Cup --  Oral - Nectar Straw --  Oral - Thin Teaspoon --  Oral - Thin Cup --  Oral - Thin Straw --  Oral - Puree --  Oral - Mech Soft --  Oral - Regular --  Oral - Multi-Consistency --  Oral - Pill --  Oral Phase - Comment --    CHL IP PHARYNGEAL PHASE 08/26/2017  Pharyngeal Phase Impaired  Pharyngeal- Pudding Teaspoon --  Pharyngeal --  Pharyngeal- Pudding Cup --  Pharyngeal --  Pharyngeal- Honey Teaspoon --  Pharyngeal --  Pharyngeal- Honey Cup --  Pharyngeal --  Pharyngeal- Nectar Teaspoon --  Pharyngeal --  Pharyngeal- Nectar Cup --  Pharyngeal --  Pharyngeal- Nectar Straw WFL  Pharyngeal --  Pharyngeal- Thin Teaspoon --  Pharyngeal --  Pharyngeal- Thin Cup --  Pharyngeal --  Pharyngeal- Thin Straw Penetration/Aspiration during swallow;Penetration/Aspiration before swallow;Reduced airway/laryngeal closure;Trace aspiration  Pharyngeal Material enters airway, passes BELOW cords then ejected out;Material enters airway, passes BELOW cords without attempt by patient to eject out (silent aspiration)  Pharyngeal- Puree WFL  Pharyngeal --  Pharyngeal- Mechanical Soft --  Pharyngeal --  Pharyngeal- Regular WFL  Pharyngeal --  Pharyngeal- Multi-consistency --  Pharyngeal --  Pharyngeal- Pill --  Pharyngeal --  Pharyngeal Comment --     No  flowsheet data found.  CHL IP GO 08/03/2014  Functional Assessment Tool Used clinical judgement  Functional Limitations Swallowing  Swallow Current Status [phone](G8996) CI  Swallow Goal Status (X9147(G8997) CI  Swallow Discharge Status (W2956(G8998) CI  Motor Speech Current Status (O1308(G8999) (None)  Motor Speech Goal Status (M5784(G9186) (None)  Motor Speech Goal Status (O9629(G9158) (None)  Spoken Language Comprehension Current Status (B2841(G9159) (None)  Spoken Language Comprehension Goal Status (L2440(G9160) (None)  Spoken Language Comprehension Discharge Status [phone](G9161) (None)  Spoken Language Expression Current Status [phone]([phone]) (None)  Spoken Language Expression Goal Status (Q0347(G9163) (None)  Spoken Language Expression Discharge Status [phone](G9164) (None)  Attention Current Status (G3875(G9165) (None)  Attention Goal Status (I4332(G9166) (None)  Attention Discharge Status [phone]([phone]) (None)  Memory Current Status (C1660(G9168) (None)  Memory Goal Status (Y3016(G9169) (None)  Memory Discharge Status (W1093(G9170) (None)  Voice Current Status (A3557(G9171) (None)  Voice Goal Status (D2202(G9172) (None)  Voice Discharge Status (R4270(G9173) (None)  Other Speech-Language Pathology Functional Limitation Current Status (W2376(G9174) (None)  Other Speech-Language Pathology Functional Limitation Goal Status (E8315(G9175) (None)  Other Speech-Language Pathology Functional Limitation Discharge Status [phone](G9176) (None)   Kendal HymenBonnie  Seleta Hovland, MA CCC-SLP [phone]  Claudine Mouton 08/26/2017, 12:57 PM

## 2017-08-27 ENCOUNTER — Telehealth: Payer: Self-pay

## 2017-08-27 DIAGNOSIS — R1312 Dysphagia, oropharyngeal phase: Secondary | ICD-10-CM

## 2017-08-27 NOTE — Telephone Encounter (Signed)
I called the patient.  The patient had a fees study yesterday through speech therapy, they recommend an outpatient speech therapy follow-up.  I will try to get this set up.

## 2017-08-27 NOTE — Telephone Encounter (Signed)
[name] is calling saying that the patient saw speech therapy yesterday and was told to ask Dr. Anne Hahn to review the results and start a referral for outpt neurorehab.

## 2017-08-27 NOTE — Addendum Note (Signed)
Addended by: York Spaniel on: 08/27/2017 10:41 AM   Modules accepted: Orders

## 2017-09-06 DIAGNOSIS — J984 Other disorders of lung: Secondary | ICD-10-CM | POA: Insufficient documentation

## 2017-09-06 DIAGNOSIS — G35 Multiple sclerosis: Secondary | ICD-10-CM | POA: Diagnosis not present

## 2017-09-06 DIAGNOSIS — R400 Somnolence: Secondary | ICD-10-CM | POA: Diagnosis not present

## 2017-09-06 DIAGNOSIS — F32A Depression, unspecified: Secondary | ICD-10-CM | POA: Insufficient documentation

## 2017-09-06 DIAGNOSIS — R0600 Dyspnea, unspecified: Secondary | ICD-10-CM | POA: Diagnosis not present

## 2017-09-11 DIAGNOSIS — K7689 Other specified diseases of liver: Secondary | ICD-10-CM | POA: Diagnosis not present

## 2017-09-11 DIAGNOSIS — J9811 Atelectasis: Secondary | ICD-10-CM | POA: Diagnosis not present

## 2017-09-15 ENCOUNTER — Other Ambulatory Visit: Payer: Self-pay

## 2017-09-15 ENCOUNTER — Ambulatory Visit: Payer: Medicare Other | Attending: Neurology | Admitting: Speech Pathology

## 2017-09-15 DIAGNOSIS — R471 Dysarthria and anarthria: Secondary | ICD-10-CM | POA: Diagnosis not present

## 2017-09-15 DIAGNOSIS — R1313 Dysphagia, pharyngeal phase: Secondary | ICD-10-CM | POA: Diagnosis not present

## 2017-09-15 NOTE — Patient Instructions (Addendum)
Swallow precautions - Take a small sip and hold it in your mouth - Hold your breath and keep holding until you have finished swallowing  VOCAL FOLD ADDUCTION EXERCISES  Breathe before each excercise  Yawn-Sigh  Take an easy breath through your mouth while yawning gently. When you breathe out, say an easy, prolonged "AH" 10x, 3x a day  1.  PUSH Bear down against a chair while saying /a/ 5 times, with a lot of effort  2.  PULL up of the seat of a chair with both hands while saying /a/ 5 times with a lot of   Effort  3. Take a breath, hold it for 3 seconds with your mouth open, then release it with a loud exhale. Think of an army or football "HUH." You are using your voice box to hold in your breath.  4. Glide your pitch high and low (as low and high as possible) "Knoll"  5. Glide your pitch low to high "Knoll"  6. Gentle cough/throat clear, then continue voicing for 3-5 seconds  7. Hold your breath, swallow hard, then immediately cough   Do these 10 times each, 3 times a day   Provided by: Shon Hale, SLP

## 2017-09-16 NOTE — Therapy (Signed)
[facility] [address] [place], [place], [address] Phone: [phone]   Fax:  [phone]  Speech Language Pathology Evaluation  Patient Details  Name: [name] MRN: [id number] Date of Birth: 11-Sep-1961 Referring Provider: Dr.Willis   Encounter Date: 09/15/2017  End of Session - 09/15/17 1812    Visit Number  1    Number of Visits  17    Date for SLP Re-Evaluation  11/19/17    Authorization Type  MCR - needs g code and RN every 10th visit    SLP Start Time  1316    SLP Stop Time   1403    SLP Time Calculation (min)  47 min    Activity Tolerance  Patient tolerated treatment well       Past Medical History:  Diagnosis Date  . Abnormality of gait 11/21/2015  . Asthma    childhood asthma  . Classic migraine   . Depression   . Dysphagia   . Hay fever   . MS (multiple sclerosis) (HCC)   . 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 eye surgeries     x 2; bilateral 74 and 74    There were no vitals filed for this visit.  Subjective Assessment - 09/15/17 1323    Subjective  "my throat started getting really tight...i started choking and i could not get my breath. It would constrict so bad I couldn't breathe and I would pass out."    Currently in Pain?  Yes    Pain Score  4     Pain Location  -- diffuse, "all over"    Pain Orientation  -- diffuse    Pain Descriptors / Indicators  -- stinging    Aggravating Factors   new drug infusion (Ocrevus)         SLP Evaluation [facility] - 09/15/17 1323      SLP Visit Information   SLP Received On  09/15/17    Referring Provider  Dr.Willis    Onset Date  08/27/17 pt reports onset of spasms about one year ago    Medical Diagnosis  oropharyngeal dysphagia, MS      General Information   HPI  Pt is a 56 year old male with a diagnosis of primary progressive multiple sclerosis who is wheel chair bound and mildly  dysarthric. Pt referred to OP SLP by Dr. Anne Hahn; FEES recently completed 08/26/17 after ENT referral due to laryngospasms. When this problem began pt reports he lost consciousness, but after learning a "sniff/hiss" maneuver (hard rapid inhalation and slow exhalation) he has been able to avoid loss of consciouness and release the spasm. Pt an wife report the pt often has one of these events while drinking; he will get choked, cough and then go into laryngospasm. FEES findings of mild pharyngeal dysphagia characterized by slightly incomplete laryngeal closure as the bolus passes the airway, with trace silent aspiration of thin liquids. Per SLP recommendation, pt has been using a breath hold during the swallow which he states has decreased frequency of laryngeal spasms. Pt had an MBS in 2015 that showed minimal deficits, only a slight delay in timing though SLP suspected fatigue could be a factor in pts frequent coughing episodes. He was recommended to f/u with OP SLP, but per the pts report he has not seen an SLP.     Behavioral/Cognition  alert, cooperative    Mobility Status  uses motorized  wheelchair      Prior Functional Status   Cognitive/Linguistic Baseline  Within functional limits    Type of Home  House     Lives With  Spouse    Available Support  Family      Cognition   Overall Cognitive Status  Within Functional Limits for tasks assessed      Auditory Comprehension   Overall Auditory Comprehension  Appears within functional limits for tasks assessed      Visual Recognition/Discrimination   Discrimination  Not tested      Reading Comprehension   Reading Status  Not tested      Expression   Primary Mode of Expression  Verbal      Verbal Expression   Overall Verbal Expression  Appears within functional limits for tasks assessed      Written Expression   Dominant Hand  Right    Written Expression  Not tested      Oral Motor/Sensory Function   Overall Oral Motor/Sensory Function   Appears within functional limits for tasks assessed    Overall Oral Motor/Sensory Function  neck/jaw tension right > left      Motor Speech   Overall Motor Speech  Impaired    Respiration  Impaired    Level of Impairment  Word    Phonation  Low vocal intensity    Resonance  Within functional limits    Articulation  Impaired    Level of Impairment  Sentence    Intelligibility  Intelligible in quiet environment; intelligibility decreased in noise    Motor Planning  Witnin functional limits    Effective Techniques  Slow rate;Increased vocal intensity;Over-articulate    Phonation  Impaired    Tension Present  Jaw;Neck    Volume  Soft      Standardized Assessments   Standardized Assessments   Other Assessment        Oral motor assessment revealed WNL lingual ROM and WNL lingual strength. Labial ROM was WNL and strength was WNL. Velar ROM appeared WNL. Per FEES 08/27/17 pt seemed to have slightly decreased range of motion during the act of swallowing than during volitional motor tasks (breath hold, sustained EEE, etc.)   Measured when a sound level meter was placed 30 cm away from pt's mouth, 5 minutes of conversational speech was reduced today, at average 62dB (WNL= average 70-72dB) with range of 58-67dB. Overall speech intelligibility for this listener in a quiet environment was not affected, at approx 100%. Pt reports running out of air when talking, difficulty projecting his voice, and frequent requests for repeats when he is outside of a quiet environment.   Maximum inspiratory and expiratory pressures measured via respiratory pressure manometry. Pt's maximum inspiratory pressure reduced at 43cm H20 pressure (range for age is 53.8 to 97.86), as is his maximum expiratory pressure at 38 cm H20 pressure (range for age is 72.18 to 129.18).   Pt would benefit from skilled ST to target respiratory muscle strength and laryngeal adduction in order to improve swallow function, intelligibility and  pt's QOL.    SLP Education - 09/15/17 1815    Education provided  Yes    Education Details  plan of care, proposed therapy goals    Person(s) Educated  Patient;Spouse    Methods  Explanation    Comprehension  Verbalized understanding       SLP Short Term Goals - 09/15/17 1809      SLP SHORT TERM GOAL #1   Title  pt  will demo swallow precautions with POs independently     Time  4    Period  Weeks    Status  New      SLP SHORT TERM GOAL #2   Title  pt will complete his HEP for dysphagia with rare min A over 2 visits    Time  4    Period  Weeks    Status  New      SLP SHORT TERM GOAL #3   Title  pt will complete his HEP for dysarthria with rare min A over 2 visits    Time  4    Period  Weeks    Status  New      SLP SHORT TERM GOAL #4   Title  pt will use abdominal breathing 75% of the time in sentence responses    Time  4    Period  Weeks    Status  New      SLP SHORT TERM GOAL #5   Title  pt will improve conversational loudness to low 70s dB average in 5 minutes simple conversation    Time  4    Period  Weeks    Status  New       SLP Long Term Goals - 09/15/17 1810      SLP LONG TERM GOAL #1   Title  pt will complete HEP for dysphagia with modified independence in two sessions    Time  8    Period  Weeks    Status  New      SLP LONG TERM GOAL #2   Title  pt will complete HEP for dysarthria with modified independence in two sessions    Time  8    Period  Weeks    Status  New      SLP LONG TERM GOAL #3   Title  pt will use abdominal breathing 70% of the time with rare min A in 10 mintues simple conversation over two sessions    Time  8    Period  Weeks    Status  New      SLP LONG TERM GOAL #4   Title  pt will use average low 70s dB conversational volume for 10 minutes in simple conversation    Time  8    Period  Weeks    Status  New       Plan - 09/15/17 1813    Clinical Impression Statement  Patient presents with mild pharyngeal dysphagia per  FEES and mild-moderate dysarthria secondary to MS. Respiratory support for speech impaired; pt has difficulties projecting voice, feels short of breath when speaking. Pt would benefit from skilled ST for training in swallow compensations, exercises for vocal fold adduction, respiratory muscle strength training and dysarthria interventions in order to improve communication and swallowing function.     Speech Therapy Frequency  2x / week    Treatment/Interventions  Aspiration precaution training;Diet toleration management by SLP;Compensatory strategies;Cueing hierarchy;Functional tasks;SLP instruction and feedback;Patient/family education    Potential to Achieve Goals  Good    Potential Considerations  Medical prognosis    SLP Home Exercise Plan  loud /a/; will give respiratory muscle trainers and vocal fold adduction exercises at next visit    Consulted and Agree with Plan of Care  Patient       Patient will benefit from skilled therapeutic intervention in order to improve the following deficits and impairments:   Dysphagia, pharyngeal phase  Dysarthria  and anarthria  G-Codes - 09/15/17 1315    Functional Assessment Tool Used  skilled clinical judgment, NOMS    Functional Limitations  Swallowing    Swallow Current Status (W0981)  At least 1 percent but less than 20 percent impaired, limited or restricted    Swallow Goal Status (X9147)  At least 1 percent but less than 20 percent impaired, limited or restricted       Problem List Patient Active Problem List   Diagnosis Date Noted  . 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 Abnormality of gait 11/21/2015  . Transient alteration of awareness 11/21/2015  . Meralgia paresthetica, tx by Dr. Sandria Manly 09/21/2012  . Neurogenic bladder 09/21/2012  . MS (multiple sclerosis), followed by Dr. Sandria Manly in neurology 08/04/2012    Rondel Baton, MS, CCC-SLP Speech-Language Pathologist  Arlana Lindau 09/16/2017, 9:06 AM  [facility] [address] [place], [place], [address] Phone: [phone]   Fax:  [phone]  Name: [name] MRN: [id number] Date of Birth: 1961/09/15

## 2017-09-28 ENCOUNTER — Encounter: Payer: Medicare Other | Admitting: Speech Pathology

## 2017-09-29 ENCOUNTER — Ambulatory Visit: Payer: Medicare Other

## 2017-10-01 ENCOUNTER — Ambulatory Visit: Payer: Medicare Other

## 2017-10-01 DIAGNOSIS — R1313 Dysphagia, pharyngeal phase: Secondary | ICD-10-CM

## 2017-10-01 DIAGNOSIS — R471 Dysarthria and anarthria: Secondary | ICD-10-CM

## 2017-10-01 NOTE — Patient Instructions (Signed)
Practice abdominal breathing 15 minutes, twice each day.

## 2017-10-01 NOTE — Therapy (Signed)
[facility] [address] [place], [place], [address] Phone: [phone]   Fax:  [phone]  Speech Language Pathology Treatment  Patient Details  Name: [name] MRN: [id number] Date of Birth: 1961-07-11 Referring Provider: Dr.Willis   Encounter Date: 10/01/2017  End of Session - 10/01/17 1648    Visit Number  2    Number of Visits  17    Date for SLP Re-Evaluation  11/19/17    Authorization Type  MCR - needs g code and RN every 10th visit    SLP Start Time  1405    SLP Stop Time   1445    SLP Time Calculation (min)  40 min    Activity Tolerance  Patient tolerated treatment well       Past Medical History:  Diagnosis Date  . Abnormality of gait 11/21/2015  . Asthma    childhood asthma  . Classic migraine   . Depression   . Dysphagia   . Hay fever   . MS (multiple sclerosis) (HCC)   . 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 eye surgeries     x 2; bilateral 74 and 74    There were no vitals filed for this visit.         ADULT SLP TREATMENT - 10/01/17 1432      General Information   Behavior/Cognition  Alert;Cooperative;Pleasant mood      Treatment Provided   Treatment provided  Dysphagia      Dysphagia Treatment   Temperature Spikes Noted  No    Respiratory Status  Room air    Oral Cavity - Dentition  Adequate natural dentition    Treatment Methods  Therapeutic exercise;Patient/caregiver education    Patient observed directly with PO's  No    Other treatment/comments  SLP educated pt/wife re: vocal fold adduction exercises and pt return demonstrated. Also see other information in "pt education" for more details. SLP reviewed rec diet (Mark/thin) with pt/wife.      Cognitive-Linquistic Treatment   Treatment focused on  Dysarthria    Skilled Treatment  SLP educated pt re: abdominal breathing and taught pt abdominal breatihng today. At  rest, pt with 85-90% success. SLP used demonstration and visual cues for teaching pt this.       Assessment / Recommendations / Plan   Plan  Continue with current plan of care      Progression Toward Goals   Progression toward goals  Progressing toward goals       SLP Education - 10/01/17 1646    Education provided  Yes    Education Details  smaller more frequent meals,  HEP for vocal fold strengthening, why voice becomes fatigued, abdominal breathing    Person(s) Educated  Patient;Spouse    Methods  Explanation;Verbal cues    Comprehension  Verbalized understanding;Returned demonstration;Verbal cues required;Need further instruction       SLP Short Term Goals - 10/01/17 1650      SLP SHORT TERM GOAL #1   Title  pt will demo swallow precautions with POs independently     Time  4    Period  Weeks    Status  On-going      SLP SHORT TERM GOAL #2   Title  pt will complete his HEP for dysphagia with rare min A over 2 visits    Time  4    Period  Weeks  Status  On-going      SLP SHORT TERM GOAL #3   Title  pt will complete his HEP for dysarthria with rare min A over 2 visits    Time  4    Period  Weeks    Status  On-going      SLP SHORT TERM GOAL #4   Title  pt will use abdominal breathing 75% of the time in sentence responses    Time  4    Period  Weeks    Status  On-going      SLP SHORT TERM GOAL #5   Title  pt will improve conversational loudness to low 70s dB average in 5 minutes simple conversation    Time  4    Period  Weeks    Status  On-going       SLP Long Term Goals - 10/01/17 1650      SLP LONG TERM GOAL #1   Title  pt will complete HEP for dysphagia with modified independence in two sessions    Time  8    Period  Weeks    Status  On-going      SLP LONG TERM GOAL #2   Title  pt will complete HEP for dysarthria with modified independence in two sessions    Time  8    Period  Weeks    Status  On-going      SLP LONG TERM GOAL #3   Title  pt will  use abdominal breathing 70% of the time with rare min A in 10 mintues simple conversation over two sessions    Time  8    Period  Weeks    Status  On-going      SLP LONG TERM GOAL #4   Title  pt will use average low 70s dB conversational volume for 10 minutes in simple conversation    Time  8    Period  Weeks    Status  On-going       Plan - 10/01/17 1649    Clinical Impression Statement  Patient presents with reported mild pharyngeal dysphagia per FEES, and mild-moderate dysarthria secondary to MS. SLP educated pt and wife on multiple things today and taught pt abdominal breathing at rest. See "skilled treatment" and "other therapy notes/comments" for further details. Pt would benefit from skilled ST for training in swallow compensations, exercises for vocal fold adduction, respiratory muscle strength training and dysarthria interventions in order to improve communication and swallowing function.     Speech Therapy Frequency  2x / week    Duration  -- 8 weeks or 17 total visits    Treatment/Interventions  Aspiration precaution training;Diet toleration management by SLP;Compensatory strategies;Cueing hierarchy;Functional tasks;SLP instruction and feedback;Patient/family education    Potential to Achieve Goals  Good    Potential Considerations  Medical prognosis    SLP Home Exercise Plan  loud /a/; will give respiratory muscle trainers and vocal fold adduction exercises at next visit    Consulted and Agree with Plan of Care  Patient       Patient will benefit from skilled therapeutic intervention in order to improve the following deficits and impairments:   Dysphagia, pharyngeal phase  Dysarthria and anarthria    Problem List Patient Active Problem List   Diagnosis Date Noted  . 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 Abnormality of gait 11/21/2015  . Transient alteration of awareness 11/21/2015  . Meralgia paresthetica, tx by Dr. Sandria ManlyLove 09/21/2012  . Neurogenic  bladder 09/21/2012  .  MS (multiple sclerosis), followed by Dr. Sandria Manly in neurology 08/04/2012    [facility] ,MS, CCC-SLP  10/01/2017, 4:51 PM  [facility] [address] [place], [place], [address] Phone: [phone]   Fax:  [phone]   Name: [name] MRN: [id number] Date of Birth: [birth year]

## 2017-10-04 ENCOUNTER — Ambulatory Visit: Payer: Medicare Other

## 2017-10-04 DIAGNOSIS — R471 Dysarthria and anarthria: Secondary | ICD-10-CM | POA: Diagnosis not present

## 2017-10-04 DIAGNOSIS — R1313 Dysphagia, pharyngeal phase: Secondary | ICD-10-CM | POA: Diagnosis not present

## 2017-10-04 NOTE — Patient Instructions (Signed)
  Try 10 reps at a time, three times a day If you are at effort level 8-9 by rep #7, do 5 reps 4-5 times a day   Separate your sets by one hour at least

## 2017-10-04 NOTE — Therapy (Signed)
[facility] [address] [place], [place], [address] Phone: [phone]   Fax:  [phone]  Speech Language Pathology Treatment  Patient Details  Name: [name] MRN: [id number] Date of Birth: 1961/03/08 Referring Provider: Dr.Willis   Encounter Date: 10/04/2017  End of Session - 10/04/17 1249    Visit Number  3    Number of Visits  17    Date for SLP Re-Evaluation  11/19/17    SLP Start Time  0850    SLP Stop Time   0930    SLP Time Calculation (min)  40 min    Activity Tolerance  Patient tolerated treatment well       Past Medical History:  Diagnosis Date  . Abnormality of gait 11/21/2015  . Asthma    childhood asthma  . Classic migraine   . Depression   . Dysphagia   . Hay fever   . MS (multiple sclerosis) (HCC)   . 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 eye surgeries     x 2; bilateral 74 and 74    There were no vitals filed for this visit.  Subjective Assessment - 10/04/17 0924    Subjective  Pt entered today with wife.    Currently in Pain?  No/denies            ADULT SLP TREATMENT - 10/04/17 0926      General Information   Behavior/Cognition  Alert;Cooperative;Pleasant mood      Treatment Provided   Treatment provided  Cognitive-Linquistic      Cognitive-Linquistic Treatment   Treatment focused on  Dysarthria    Skilled Treatment  SLP educated pt and wife on expiratory muscle training (EMT) device and SLP provided pt with device to work with at home. Wife reports she needs to strongly encourage pt to work on existing HEP. Pt was fatigued after only 3 reps at 30CM H2O, so SLP told pt if that occurs to modify sets of 10 reps to sets of 5 reps, and complete more often throughout the day (see pt instructions). Pt performed voice HEP with occasional min-mod A.       Assessment / Recommendations / Plan   Plan  Continue with current  plan of care      Progression Toward Goals   Progression toward goals  Not progressing toward goals (comment) needs to complete HEP regularly for chance of progress       SLP Education - 10/04/17 1249    Education provided  Yes    Education Details  expiratory muscle training, voice exercises for swallowing    Person(s) Educated  Patient;Spouse    Methods  Explanation;Demonstration;Handout;Verbal cues    Comprehension  Verbal cues required;Verbalized understanding;Returned demonstration       SLP Short Term Goals - 10/04/17 1251      SLP SHORT TERM GOAL #1   Title  pt will demo swallow precautions with POs independently     Time  3    Period  Weeks    Status  On-going      SLP SHORT TERM GOAL #2   Title  pt will complete his HEP for dysphagia with rare min A over 2 visits    Time  3    Period  Weeks    Status  On-going      SLP SHORT TERM GOAL #3   Title  pt will complete his HEP for  dysarthria with rare min A over 2 visits    Time  3    Period  Weeks    Status  On-going      SLP SHORT TERM GOAL #4   Title  pt will use abdominal breathing 75% of the time in sentence responses    Time  3    Period  Weeks    Status  On-going      SLP SHORT TERM GOAL #5   Title  pt will improve conversational loudness to low 70s dB average in 5 minutes simple conversation    Time  3    Period  Weeks    Status  On-going       SLP Long Term Goals - 10/04/17 1251      SLP LONG TERM GOAL #1   Title  pt will complete HEP for dysphagia with modified independence in two sessions    Time  7    Period  Weeks    Status  On-going      SLP LONG TERM GOAL #2   Title  pt will complete HEP for dysarthria with modified independence in two sessions    Time  7    Period  Weeks    Status  On-going      SLP LONG TERM GOAL #3   Title  pt will use abdominal breathing 70% of the time with rare min A in 10 mintues simple conversation over two sessions    Time  7    Period  Weeks    Status   On-going      SLP LONG TERM GOAL #4   Title  pt will use average low 70s dB conversational volume for 10 minutes in simple conversation    Time  7    Period  Weeks    Status  On-going       Plan - 10/04/17 1250    Clinical Impression Statement  Patient presents with reported mild pharyngeal dysphagia per FEES, and mild-moderate dysarthria secondary to MS. SLP educated pt and wife on expiratory muscle training, and again educated pt re: voice exercises for swallowing. SLP suspects pt is not completing existing HEP as directed due to level of cueing still necessary from SLP, and from wife report. See "skilled intervention" for further details. Pt would benefit from skilled ST for training in swallow compensations, exercises for vocal fold adduction, respiratory muscle strength training and dysarthria interventions in order to improve communication and swallowing function.     Speech Therapy Frequency  2x / week    Duration  -- 8 weeks or 17 total visits    Treatment/Interventions  Aspiration precaution training;Diet toleration management by SLP;Compensatory strategies;Cueing hierarchy;Functional tasks;SLP instruction and feedback;Patient/family education    Potential to Achieve Goals  Good    Potential Considerations  Medical prognosis    SLP Home Exercise Plan  loud /a/; will give respiratory muscle trainers and vocal fold adduction exercises at next visit    Consulted and Agree with Plan of Care  Patient       Patient will benefit from skilled therapeutic intervention in order to improve the following deficits and impairments:   Dysarthria and anarthria  Dysphagia, pharyngeal phase    Problem List Patient Active Problem List   Diagnosis Date Noted  . 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 Abnormality of gait 11/21/2015  . Transient alteration of awareness 11/21/2015  . Meralgia paresthetica, tx by Dr. Sandria Manly 09/21/2012  . Neurogenic bladder 09/21/2012  .  MS (multiple sclerosis),  followed by Dr. Sandria ManlyLove in neurology 08/04/2012    [facility]IN[facility],Valarie Farace ,MS, CCC-SLP  10/04/2017, 12:52 PM  [facility] [address] [place], [place], [address] Phone: [phone]   Fax:  [phone]   Name: [name] MRN: [id number] Date of Birth: [birth year]

## 2017-10-07 DIAGNOSIS — R400 Somnolence: Secondary | ICD-10-CM | POA: Diagnosis not present

## 2017-10-08 DIAGNOSIS — R1312 Dysphagia, oropharyngeal phase: Secondary | ICD-10-CM | POA: Diagnosis not present

## 2017-10-08 DIAGNOSIS — G35 Multiple sclerosis: Secondary | ICD-10-CM | POA: Diagnosis not present

## 2017-10-08 DIAGNOSIS — R400 Somnolence: Secondary | ICD-10-CM | POA: Diagnosis not present

## 2017-10-08 DIAGNOSIS — J984 Other disorders of lung: Secondary | ICD-10-CM | POA: Diagnosis not present

## 2017-10-08 DIAGNOSIS — J385 Laryngeal spasm: Secondary | ICD-10-CM | POA: Diagnosis not present

## 2017-10-11 ENCOUNTER — Ambulatory Visit: Payer: Medicare Other

## 2017-10-11 ENCOUNTER — Other Ambulatory Visit: Payer: Self-pay

## 2017-10-11 DIAGNOSIS — R1313 Dysphagia, pharyngeal phase: Secondary | ICD-10-CM | POA: Diagnosis not present

## 2017-10-11 DIAGNOSIS — R471 Dysarthria and anarthria: Secondary | ICD-10-CM

## 2017-10-11 NOTE — Therapy (Signed)
[facility] [address] [place], [place], [address] Phone: [phone]   Fax:  [phone]  Speech Language Pathology Treatment  Patient Details  Name: [name] F Sigmund MRN: [id number] Date of Birth: 10/02/1961 Referring Provider: Dr.Willis   Encounter Date: 10/11/2017  End of Session - 10/11/17 1217    Visit Number  4    Number of Visits  17    Date for SLP Re-Evaluation  11/19/17    SLP Start Time  1104    SLP Stop Time   1145    SLP Time Calculation (min)  41 min    Activity Tolerance  Patient tolerated treatment well       Past Medical History:  Diagnosis Date  . Abnormality of gait 11/21/2015  . Asthma    childhood asthma  . Classic migraine   . Depression   . Dysphagia   . Hay fever   . MS (multiple sclerosis) (HCC)   . 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 eye surgeries     x 2; bilateral 74 and 76    There were no vitals filed for this visit.         ADULT SLP TREATMENT - 10/11/17 1208      General Information   Behavior/Cognition  Alert;Cooperative;Pleasant mood      Treatment Provided   Treatment provided  Dysphagia;Cognitive-Linquistic      Dysphagia Treatment   Temperature Spikes Noted  No    Respiratory Status  Room air    Oral Cavity - Dentition  Adequate natural dentition    Treatment Methods  Therapeutic exercise;Patient/caregiver education    Type of cueing  Verbal;Visual    Amount of cueing  -- occasional    Other treatment/comments  Pt performed HEP with mod cues occasionally.  SLP educated pt/wife on inspiratory muscle trainer. Pt initial setting at 32.5 cm H2O did not produce "bike pump" sound >30% of the time, so SLP lowered to just above 31cm H2O. Reps at this level were produced with appropriate sound 100% so SLP again modified to just below 33cm H2O - and pt produced adequate sound. Later in the session pt stated, "Now  I am doing better with the inhalation device."      Cognitive-Linquistic Treatment   Treatment focused on  Dysarthria    Skilled Treatment  Pt performed inspiratory and expiratory muscle trainers today with pt performing 5 sets of 5 reps with 30 seconds between sets, and education was completed pt/wife re: inspiratory trainer, set to just below 33cm H2O (75% of max is 32.5cm H2O). Pt completed 20 reps of these exercises split up in four 5 rep groups. Wife and pt agree wife does not need to cue pt as freqwuently for repetition of utterances.       Assessment / Recommendations / Plan   Plan  Continue with current plan of care      Progression Toward Goals   Progression toward goals  Progressing toward goals       SLP Education - 10/11/17 1216    Education provided  Yes    Education Details  inspiratory muscle device, voice HEP    Person(s) Educated  Patient;Spouse    Methods  Explanation    Comprehension  Verbal cues required;Returned demonstration;Verbalized understanding;Need further instruction       SLP Short Term Goals - 10/11/17 1219      SLP SHORT TERM GOAL #1  Title  pt will demo swallow precautions with POs independently     Time  2    Period  Weeks    Status  On-going      SLP SHORT TERM GOAL #2   Title  pt will complete his HEP for dysphagia with rare min A over 2 visits    Time  2    Period  Weeks    Status  On-going      SLP SHORT TERM GOAL #3   Title  pt will complete his HEP for dysarthria with rare min A over 2 visits    Time  2    Period  Weeks    Status  On-going      SLP SHORT TERM GOAL #4   Title  pt will use abdominal breathing 75% of the time in sentence responses    Time  2    Period  Weeks    Status  On-going      SLP SHORT TERM GOAL #5   Title  pt will improve conversational loudness to low 70s dB average in 5 minutes simple conversation    Time  2    Period  Weeks    Status  On-going       SLP Long Term Goals - 10/11/17 1219      SLP  LONG TERM GOAL #1   Title  pt will complete HEP for dysphagia with modified independence in two sessions    Time  6    Period  Weeks    Status  On-going      SLP LONG TERM GOAL #2   Title  pt will complete HEP for dysarthria with modified independence in two sessions    Time  6    Period  Weeks    Status  On-going      SLP LONG TERM GOAL #3   Title  pt will use abdominal breathing 70% of the time with rare min A in 10 mintues simple conversation over two sessions    Time  6    Period  Weeks    Status  On-going      SLP LONG TERM GOAL #4   Title  pt will use average low 70s dB conversational volume for 10 minutes in simple conversation    Time  6    Period  Weeks    Status  On-going       Plan - 10/11/17 1217    Clinical Impression Statement  Pt/wife reported desiring a OT eval - ST referred pt/wife to Dr. Anne Hahn. atient presents with reported mild pharyngeal dysphagia per FEES, and cont'd mild-moderate dysarthria secondary to MS. SLP educated pt and wife on inspiratory muscle training, and again educated pt re: voice exercises for swallowing. SLP again wonders if pt is compliant with existing swallowing (voice) HEP as directed due to level of cueing still necessary from SLP, and from wife report. See "skilled intervention" for further details. Pt would benefit from skilled ST for training in swallow compensations, exercises for vocal fold adduction, respiratory muscle strength training and dysarthria interventions in order to improve communication and swallowing function.     Speech Therapy Frequency  2x / week    Duration  -- 8 weeks or 17 total visits    Treatment/Interventions  Aspiration precaution training;Diet toleration management by SLP;Compensatory strategies;Cueing hierarchy;Functional tasks;SLP instruction and feedback;Patient/family education    Potential to Achieve Goals  Good    Potential Considerations  Medical  prognosis    SLP Home Exercise Plan  loud /a/; will give  respiratory muscle trainers and vocal fold adduction exercises at next visit    Consulted and Agree with Plan of Care  Patient       Patient will benefit from skilled therapeutic intervention in order to improve the following deficits and impairments:   Dysphagia, pharyngeal phase  Dysarthria and anarthria    Problem List Patient Active Problem List   Diagnosis Date Noted  . 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 Abnormality of gait 11/21/2015  . Transient alteration of awareness 11/21/2015  . Meralgia paresthetica, tx by Dr. Sandria Manly 09/21/2012  . Neurogenic bladder 09/21/2012  . MS (multiple sclerosis), followed by Dr. Sandria Manly in neurology 08/04/2012    [facility] ,MS, CCC-SLP  10/11/2017, 12:20 PM  [facility] [address] [place], [place], [address] Phone: [phone]   Fax:  [phone]   Name: EUGENIO DOLLINS MRN: [id number] Date of Birth: Jul 08, 1961

## 2017-10-14 ENCOUNTER — Ambulatory Visit: Payer: Medicare Other | Attending: Neurology

## 2017-10-14 DIAGNOSIS — R1313 Dysphagia, pharyngeal phase: Secondary | ICD-10-CM | POA: Insufficient documentation

## 2017-10-14 DIAGNOSIS — R471 Dysarthria and anarthria: Secondary | ICD-10-CM

## 2017-10-14 NOTE — Therapy (Signed)
[facility] [address] [place], [place], [address] Phone: [phone]   Fax:  [phone]  Speech Language Pathology Treatment  Patient Details  Name: [name] MRN: [id number] Date of Birth: 1960/10/16 Referring Provider: Dr.Willis   Encounter Date: 10/14/2017  End of Session - 10/14/17 1750    Visit Number  5    Number of Visits  17    Date for SLP Re-Evaluation  11/19/17    SLP Start Time  1405    SLP Stop Time   1445    SLP Time Calculation (min)  40 min    Activity Tolerance  Patient tolerated treatment well       Past Medical History:  Diagnosis Date  . Abnormality of gait 11/21/2015  . Asthma    childhood asthma  . Classic migraine   . Depression   . Dysphagia   . Hay fever   . MS (multiple sclerosis) (HCC)   . 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 eye surgeries     x 2; bilateral 74 and 74    There were no vitals filed for this visit.  Subjective Assessment - 10/14/17 1427    Subjective  Pt reports he had difficulty with inspiratory trainer setting at 7:00-7:30     Patient is accompained by:  Family member wife    Currently in Pain?  No/denies            ADULT SLP TREATMENT - 10/14/17 1436      General Information   Behavior/Cognition  Alert;Cooperative;Pleasant mood      Dysphagia Treatment   Other treatment/comments  Mod cues rarely were necessary today for dysphagia HEP.       Cognitive-Linquistic Treatment   Treatment focused on  Dysarthria    Skilled Treatment  SLP used inspiratory and expiratory muscle trainers today with pt to incr breath support for intelligible speech. Pt performed 5 sets of 5 reps with 30 seconds between sets. Pt completed 20 reps of these exercises split up in four 5 rep groups. Wife stated pt had difficulty with 33 cm H2O at home at 1830-1900. SLP suggested fatigue as factor, as pt performed almost  identical to last session withinspiratory device at 32-33 cm H2O today.      Assessment / Recommendations / Plan   Plan  Continue with current plan of care      Progression Toward Goals   Progression toward goals  Progressing toward goals       SLP Education - 10/14/17 1750    Education provided  Yes    Education Details  HEP completed by dinnertime    Person(s) Educated  Patient    Methods  Explanation    Comprehension  Verbalized understanding       SLP Short Term Goals - 10/14/17 1751      SLP SHORT TERM GOAL #1   Title  pt will demo swallow precautions with POs independently     Time  2    Period  Weeks    Status  On-going      SLP SHORT TERM GOAL #2   Title  pt will complete his HEP for dysphagia with rare min A over 2 visits    Time  2    Period  Weeks    Status  On-going      SLP SHORT TERM GOAL #3   Title  pt will complete  his HEP for dysarthria with rare min A over 2 visits    Time  2    Period  Weeks    Status  On-going      SLP SHORT TERM GOAL #4   Title  pt will use abdominal breathing 75% of the time in sentence responses    Time  2    Period  Weeks    Status  On-going      SLP SHORT TERM GOAL #5   Title  pt will improve conversational loudness to low 70s dB average in 5 minutes simple conversation    Time  2    Period  Weeks    Status  On-going       SLP Long Term Goals - 10/11/17 1219      SLP LONG TERM GOAL #1   Title  pt will complete HEP for dysphagia with modified independence in two sessions    Time  6    Period  Weeks    Status  On-going      SLP LONG TERM GOAL #2   Title  pt will complete HEP for dysarthria with modified independence in two sessions    Time  6    Period  Weeks    Status  On-going      SLP LONG TERM GOAL #3   Title  pt will use abdominal breathing 70% of the time with rare min A in 10 mintues simple conversation over two sessions    Time  6    Period  Weeks    Status  On-going      SLP LONG TERM GOAL #4    Title  pt will use average low 70s dB conversational volume for 10 minutes in simple conversation    Time  6    Period  Weeks    Status  On-going       Plan - 10/14/17 1751    Clinical Impression Statement  Patient cont to present with reported mild pharyngeal dysphagia per FEES, and mild-moderate dysarthria secondary to MS. HEP appeared better performed today than in previous sessions. See "skilled intervention" for further details. Pt would benefit from skilled ST for training in swallow compensations, exercises for vocal fold adduction, respiratory muscle strength training and dysarthria interventions in order to improve communication and swallowing function.     Speech Therapy Frequency  2x / week    Duration  -- 8 weeks or 17 total visits    Treatment/Interventions  Aspiration precaution training;Diet toleration management by SLP;Compensatory strategies;Cueing hierarchy;Functional tasks;SLP instruction and feedback;Patient/family education    Potential to Achieve Goals  Good    Potential Considerations  Medical prognosis    SLP Home Exercise Plan  loud /a/; will give respiratory muscle trainers and vocal fold adduction exercises at next visit    Consulted and Agree with Plan of Care  Patient       Patient will benefit from skilled therapeutic intervention in order to improve the following deficits and impairments:   Dysphagia, pharyngeal phase  Dysarthria and anarthria    Problem List Patient Active Problem List   Diagnosis Date Noted  . 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 Abnormality of gait 11/21/2015  . Transient alteration of awareness 11/21/2015  . Meralgia paresthetica, tx by Dr. Sandria Manly 09/21/2012  . Neurogenic bladder 09/21/2012  . MS (multiple sclerosis), followed by Dr. Sandria Manly in neurology 08/04/2012    [facility] ,MS, CCC-SLP  10/14/2017, 5:52 PM  [facility] [address] [place], [place],  [address] Phone: [phone]   Fax:  [phone]   Name: [name] MRN: [id number] Date of Birth: 10/14/1960

## 2017-10-14 NOTE — Patient Instructions (Signed)
Finish exercises by dinnertime to avert fatigue.

## 2017-10-18 ENCOUNTER — Other Ambulatory Visit: Payer: Self-pay

## 2017-10-18 ENCOUNTER — Ambulatory Visit: Payer: Medicare Other | Admitting: Speech Pathology

## 2017-10-18 DIAGNOSIS — R1313 Dysphagia, pharyngeal phase: Secondary | ICD-10-CM | POA: Diagnosis not present

## 2017-10-18 DIAGNOSIS — R471 Dysarthria and anarthria: Secondary | ICD-10-CM

## 2017-10-18 NOTE — Therapy (Signed)
[facility] [address] [place], [place], [address] Phone: [phone]   Fax:  [phone]  Speech Language Pathology Treatment  Patient Details  Name: [name] MRN: [id number] Date of Birth: 08/17/1961 Referring Provider: Dr.Willis   Encounter Date: 10/18/2017  End of Session - 10/18/17 1719    Visit Number  6    Number of Visits  17    Date for SLP Re-Evaluation  11/19/17    Authorization Type  MCR - needs g code and RN every 10th visit    SLP Start Time  1315    SLP Stop Time   1400    SLP Time Calculation (min)  45 min    Activity Tolerance  Patient tolerated treatment well       Past Medical History:  Diagnosis Date  . Abnormality of gait 11/21/2015  . Asthma    childhood asthma  . Classic migraine   . Depression   . Dysphagia   . Hay fever   . MS (multiple sclerosis) (HCC)   . 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 eye surgeries     x 2; bilateral 74 and 74    There were no vitals filed for this visit.         ADULT SLP TREATMENT - 10/18/17 1315      General Information   Behavior/Cognition  Alert;Cooperative;Pleasant mood    Patient Positioning  Upright in chair      Treatment Provided   Treatment provided  Dysphagia;Cognitive-Linquistic      Dysphagia Treatment   Temperature Spikes Noted  No    Respiratory Status  Room air    Oral Cavity - Dentition  Adequate natural dentition    Treatment Methods  Therapeutic exercise;Patient/caregiver education    Patient observed directly with PO's  Yes    Type of PO's observed  Regular;Thin liquids    Feeding  Able to feed self    Liquids provided via  Cup    Pharyngeal Phase Signs & Symptoms  -- none    Type of cueing  Verbal;Visual    Amount of cueing  Minimal    Other treatment/comments  Pt demo'd 2x 5 reps of both inspiratory and expiratory trainers. Fatigue noted with final  repetitions. Wife reports pt has been using nose clips provided at home which they feel improves his success with inspiratory trainer. Pt reports he is still coughing with spasms with meals; they report he is "controlling it better." SLP inquired whether pt is utilizing breath hold technique as this was effective to help seal arytenoids and deflect epiglottis. Pt states he has been using breath hold, however when given cup of water pt noted to repeatedly take multiple, large sips of liquids. Instructed pt re: breath hold with small, single sips, and he demo'd with rare min A. Pt's wife stating she wants to record him when having laryngospasm but is too concerned with helping him in the moment. SLP suggested recording a meal, to potentially capture any behaviors leading up to laryngospasm.       Pain Assessment   Pain Assessment  No/denies pain      Cognitive-Linquistic Treatment   Treatment focused on  Dysarthria    Skilled Treatment  Pt read sentences with average mid 70s dB and 90% accuracy for AB. In short conversation, pt rate of speech negatively impacts AB and vocal intensity, averaging upper 60s. He required frequent  min A for slow rate, AB and increased vocal intensity. Sentences for voice practice at home provided.      Assessment / Recommendations / Plan   Plan  Continue with current plan of care      Dysphagia Recommendations   Diet recommendations  Regular;Thin liquid    Liquids provided via  Cup    Medication Administration  Whole meds with puree    Supervision  Patient able to self feed    Compensations  Slow rate;Small sips/bites    Postural Changes and/or Swallow Maneuvers  Hold breath before and during swallow (Supraglottic swallow)      General Recommendations   Oral Care Recommendations  Oral care BID      Progression Toward Goals   Progression toward goals  Progressing toward goals       SLP Education - 10/18/17 1718    Education provided  Yes    Education Details   single sips of liquid; hold breath when swallowing    Person(s) Educated  Patient;Spouse    Methods  Explanation;Verbal cues    Comprehension  Verbalized understanding       SLP Short Term Goals - 10/18/17 1720      SLP SHORT TERM GOAL #1   Title  pt will demo swallow precautions with POs independently     Time  1    Period  Weeks    Status  On-going      SLP SHORT TERM GOAL #2   Title  pt will complete his HEP for dysphagia with rare min A over 2 visits    Time  1    Period  Weeks    Status  On-going      SLP SHORT TERM GOAL #3   Title  pt will complete his HEP for dysarthria with rare min A over 2 visits    Time  1    Period  Weeks    Status  On-going      SLP SHORT TERM GOAL #4   Title  pt will use abdominal breathing 75% of the time in sentence responses    Time  1    Period  Weeks    Status  Achieved      SLP SHORT TERM GOAL #5   Title  pt will improve conversational loudness to low 70s dB average in 5 minutes simple conversation    Time  1    Period  Weeks    Status  On-going       SLP Long Term Goals - 10/18/17 1722      SLP LONG TERM GOAL #1   Title  pt will complete HEP for dysphagia with modified independence in two sessions    Time  5    Period  Weeks    Status  On-going      SLP LONG TERM GOAL #2   Title  pt will complete HEP for dysarthria with modified independence in two sessions    Time  5    Period  Weeks    Status  On-going      SLP LONG TERM GOAL #3   Title  pt will use abdominal breathing 70% of the time with rare min A in 10 mintues simple conversation over two sessions    Time  5    Period  Weeks    Status  On-going      SLP LONG TERM GOAL #4   Title  pt will use average  low 70s dB conversational volume for 10 minutes in simple conversation    Time  5    Period  Weeks    Status  On-going       Plan - 10/18/17 1719    Clinical Impression Statement  Patient cont to present with reported mild pharyngeal dysphagia per FEES, and  mild-moderate dysarthria secondary to MS. Pt cont to report coughing with meals; SLP recommended single sips with breath hold vs. multiple sips of liquid. See "skilled intervention" for further details. Pt would benefit from skilled ST for training in swallow compensations, exercises for vocal fold adduction, respiratory muscle strength training and dysarthria interventions in order to improve communication and swallowing function.     Speech Therapy Frequency  2x / week    Treatment/Interventions  Aspiration precaution training;Diet toleration management by SLP;Compensatory strategies;Cueing hierarchy;Functional tasks;SLP instruction and feedback;Patient/family education    Potential to Achieve Goals  Good    Potential Considerations  Medical prognosis    Consulted and Agree with Plan of Care  Patient       Patient will benefit from skilled therapeutic intervention in order to improve the following deficits and impairments:   Dysphagia, pharyngeal phase  Dysarthria and anarthria    Problem List Patient Active Problem List   Diagnosis Date Noted  . 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 Abnormality of gait 11/21/2015  . Transient alteration of awareness 11/21/2015  . Meralgia paresthetica, tx by Dr. Sandria Manly 09/21/2012  . Neurogenic bladder 09/21/2012  . MS (multiple sclerosis), followed by Dr. Sandria Manly in neurology 08/04/2012   Rondel Baton, MS, CCC-SLP Speech-Language Pathologist  Arlana Lindau 10/18/2017, Alfonse Flavors PM  [facility] [address] [place], [place], [address] Phone: [phone]   Fax:  [phone]   Name: ELDRA WORD MRN: [id number] Date of Birth: Nov 05, 1960

## 2017-10-21 ENCOUNTER — Telehealth: Payer: Self-pay | Admitting: Neurology

## 2017-10-21 ENCOUNTER — Telehealth: Payer: Self-pay | Admitting: *Deleted

## 2017-10-21 ENCOUNTER — Encounter: Payer: Self-pay | Admitting: Family Medicine

## 2017-10-21 NOTE — Telephone Encounter (Signed)
I called the patient.  The Neudexta has been denied through the insurance company indicating that they require documentation that the medication has been effective.  I contacted the patient, he indicates that the medication has helped his pseudobulbar affect.  He also indicates that the [facility] has approved the medication, he is getting medication through the [place] at this time.

## 2017-10-21 NOTE — Telephone Encounter (Signed)
Submitted PA Nudexta on covermymeds. Key: [id number]. Waiting on response from insurance.

## 2017-10-22 ENCOUNTER — Ambulatory Visit: Payer: Medicare Other

## 2017-10-25 ENCOUNTER — Ambulatory Visit: Payer: Medicare Other

## 2017-10-25 DIAGNOSIS — R471 Dysarthria and anarthria: Secondary | ICD-10-CM | POA: Diagnosis not present

## 2017-10-25 DIAGNOSIS — R1313 Dysphagia, pharyngeal phase: Secondary | ICD-10-CM

## 2017-10-25 NOTE — Therapy (Signed)
[facility] [address] [place], [place], [address] Phone: [phone]   Fax:  [phone]  Speech Language Pathology Treatment  Patient Details  Name: [name] MRN: [id number] Date of Birth: 1961/02/28 Referring Provider: Dr.Willis   Encounter Date: 10/25/2017  End of Session - 10/25/17 1719    Visit Number  7    Number of Visits  17    Date for SLP Re-Evaluation  11/19/17    Authorization Type  MCR - needs g code and RN every 10th visit    SLP Start Time  1319    SLP Stop Time   1400    SLP Time Calculation (min)  41 min    Activity Tolerance  Patient tolerated treatment well       Past Medical History:  Diagnosis Date  . Abnormality of gait 11/21/2015  . Asthma    childhood asthma  . Classic migraine   . Depression   . Dysphagia   . Hay fever   . MS (multiple sclerosis) ([place])   . 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 eye surgeries     x 2; bilateral 74 and 76    There were no vitals filed for this visit.  Subjective Assessment - 10/25/17 1326    Subjective  "Kind of a weekend doldrum thing." (pt, re: he wasn't feeling well past weekend)    Patient is accompained by:  Family member wife    Currently in Pain?  Yes    Pain Score  7     Pain Location  Shoulder    Pain Orientation  Right    Pain Descriptors / Indicators  Stabbing    Pain Type  Chronic pain    Pain Radiating Towards  hand/wrist    Pain Onset  1 to 4 weeks ago    Pain Frequency  Constant            ADULT SLP TREATMENT - 10/25/17 1341      General Information   Behavior/Cognition  Alert;Cooperative;Pleasant mood      Treatment Provided   Treatment provided  Cognitive-Linquistic      Cognitive-Linquistic Treatment   Treatment focused on  Dysarthria    Skilled Treatment  SLP used respiratory muscle trainers (5 reps x5 sets with approx 30 seconds rest in between) to  improve pt's breath support for speech production. Routinely, pt's 4th and 5th reps were weaker than first three, on last two sets of inspiratory pt's 3rd, 4th, adn 5th reps were consistently weaker than initial and second reps. Pt's loud /a/ used today to incr pt's conversational speech to more WNL, average lower 90s dB. In 2-3 sentence responses pt maintained WNL volume approx 75% of the time.       Assessment / Recommendations / Plan   Plan  Continue with current plan of care      Dysphagia Recommendations   Diet recommendations  Regular;Thin liquid    Liquids provided via  Cup    Medication Administration  Whole meds with puree    Supervision  Patient able to self feed    Compensations  Slow rate;Small sips/bites    Postural Changes and/or Swallow Maneuvers  Hold breath before and during swallow (Supraglottic swallow)      Progression Toward Goals   Progression toward goals  Progressing toward goals       SLP Education - 10/25/17 1722  Education provided  Yes    Education Details  single sips liquid with breath old    Person(s) Educated  Patient;Spouse    Methods  Explanation    Comprehension  Verbalized understanding       SLP Short Term Goals - 10/25/17 1721      SLP SHORT TERM GOAL #1   Title  pt will demo swallow precautions with POs independently     Status  Not Met      SLP SHORT TERM GOAL #2   Title  pt will complete his HEP for dysphagia with rare min A over 2 visits    Status  Partially Met      SLP [place] #3   Title  pt will complete his HEP for dysarthria with rare min A over 2 visits    Time  --    Period  --    Status  Partially Met      SLP SHORT TERM GOAL #4   Title  pt will use abdominal breathing 75% of the time in sentence responses    Time  --    Period  --    Status  Achieved      SLP SHORT TERM GOAL #5   Title  pt will improve conversational loudness to low 70s dB average in 5 minutes simple conversation    Time  --    Period  --     Status  Not Met       SLP Long Term Goals - 10/25/17 1723      SLP LONG TERM GOAL #1   Title  pt will complete HEP for dysphagia with modified independence in two sessions    Time  4    Period  Weeks    Status  On-going      SLP LONG TERM GOAL #2   Title  pt will complete HEP for dysarthria with modified independence in two sessions    Time  4    Period  Weeks    Status  On-going      SLP LONG TERM GOAL #3   Title  pt will use abdominal breathing 70% of the time with rare min A in 10 mintues simple conversation over two sessions    Time  4    Period  Weeks    Status  On-going      SLP LONG TERM GOAL #4   Title  pt will use average low 70s dB conversational volume for 10 minutes in simple conversation    Time  4    Period  Weeks    Status  On-going       Plan - 10/25/17 1720    Clinical Impression Statement  Patient cont to present with mild-moderate dysarthria secondary to MS as well as to report mild dysphagia. SLP reminded pt of single sips with breath hold vs. multiple sips of liquid. See "skilled intervention" for further details. Pt would benefit from skilled ST for training in swallow compensations, exercises for vocal fold adduction, respiratory muscle strength training and dysarthria interventions in order to improve communication and swallowing function.     Speech Therapy Frequency  2x / week    Duration  -- 8 weeks or 17 visits total    Treatment/Interventions  Aspiration precaution training;Diet toleration management by SLP;Compensatory strategies;Cueing hierarchy;Functional tasks;SLP instruction and feedback;Patient/family education    Potential Considerations  Medical prognosis       Patient will benefit from  skilled therapeutic intervention in order to improve the following deficits and impairments:   Dysphagia, pharyngeal phase  Dysarthria and anarthria    Problem List Patient Active Problem List   Diagnosis Date Noted  . Pseudobulbar affect  05/27/2017  . Abnormality of gait 11/21/2015  . Transient alteration of awareness 11/21/2015  . Meralgia paresthetica, tx by Dr. Erling Cruz 09/21/2012  . Neurogenic bladder 09/21/2012  . MS (multiple sclerosis), followed by Dr. Erling Cruz in neurology 08/04/2012    [facility] ,[place], [place]  10/25/2017, 5:24 PM  [facility] [address] [place], [place], [address] Phone: [phone]   Fax:  [phone]   Name: KACYN SOUDER MRN: [id number] Date of Birth: September 18, 1961

## 2017-10-29 ENCOUNTER — Ambulatory Visit: Payer: Medicare Other

## 2017-10-29 DIAGNOSIS — R471 Dysarthria and anarthria: Secondary | ICD-10-CM

## 2017-10-29 DIAGNOSIS — R1313 Dysphagia, pharyngeal phase: Secondary | ICD-10-CM

## 2017-10-29 NOTE — Therapy (Signed)
[facility] [address] [place], [place], [address] Phone: [phone]   Fax:  [phone]  Speech Language Pathology Treatment  Patient Details  Name: [name] MRN: [id number] Date of Birth: 06-23-1961 Referring Provider: Dr.Willis   Encounter Date: 10/29/2017  End of Session - 10/29/17 1524    Visit Number  8    Number of Visits  17    Date for SLP Re-Evaluation  11/19/17    Authorization Type  MCR - needs g code and RN every 10th visit    SLP Start Time  1317    SLP Stop Time   1400    SLP Time Calculation (min)  43 min    Activity Tolerance  Patient tolerated treatment well       Past Medical History:  Diagnosis Date  . Abnormality of gait 11/21/2015  . Asthma    childhood asthma  . Classic migraine   . Depression   . Dysphagia   . Hay fever   . MS (multiple sclerosis) ([place])   . 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 eye surgeries     x 2; bilateral 74 and 74    There were no vitals filed for this visit.  Subjective Assessment - 10/29/17 1319    Subjective  Pt reports completing the RMT devices. [facility]ing [facility] re: OT/PT.   Patient is accompained by:  Family member wife    Currently in Pain?  Yes    Pain Score  5     Pain Location  Shoulder    Pain Orientation  Right    Pain Descriptors / Indicators  Stabbing    Pain Type  Chronic pain    Pain Onset  1 to 4 weeks ago    Pain Frequency  Constant            ADULT SLP TREATMENT - 10/29/17 1336      General Information   Behavior/Cognition  Alert;Cooperative;Pleasant mood      Treatment Provided   Treatment provided  Cognitive-Linquistic      Dysphagia Treatment   Other treatment/comments  Pt and wife stated pt is not performing HEP for vocal strengthening for improvement in swallowing ability/safety. SLP discussed the need for completion of these exercises and explained  rationale for them, physiologically, with brief description of WNL swallow using a model of tongue/pharynx. Wife also reported to SLP that pt not using single sip and supraglottic for safer swallow function. No overt s/s aspiration PNA to date. SLP reitereated to pt/wife the need for maintaining safe pulmonary health begins with folloiwng swallowing/aspiration precautions.      Cognitive-Linquistic Treatment   Treatment focused on  Dysarthria    Skilled Treatment  SLP used respiratory muscle trainers (5 reps x3-4 sets with 15-30 seconds rest in between) to improve pt's breath support for speech production. Pt's 4th and 5th reps on inspiratory device were weaker than first three, and 5th reps on expiratory device were consistently weaker than first four reps. As pt's sets progressed, noted fatigue set in.       Assessment / Recommendations / Plan   Plan  Continue with current plan of care      Dysphagia Recommendations   Diet recommendations  Regular;Thin liquid    Liquids provided via  Cup    Medication Administration  Whole meds with puree    Supervision  Patient able to self  feed    Compensations  Slow rate;Small sips/bites    Postural Changes and/or Swallow Maneuvers  Hold breath before and during swallow (Supraglottic swallow)      Progression Toward Goals   Progression toward goals  Progressing toward goals       SLP Education - 10/29/17 1523    Education provided  Yes    Education Details  basic anatomy of the swallow, needs to do voice HEP, needs to use supraglottic    Person(s) Educated  Patient;Spouse    Methods  Explanation;Other (comment) model of pharynx   model of pharynx   Comprehension  Verbalized understanding       SLP Short Term Goals - 10/25/17 1721      SLP SHORT TERM GOAL #1   Title  pt will demo swallow precautions with POs independently     Status  Not Met      SLP SHORT TERM GOAL #2   Title  pt will complete his HEP for dysphagia with rare min A over 2  visits    Status  Partially Met      SLP SHORT TERM GOAL #3   Title  pt will complete his HEP for dysarthria with rare min A over 2 visits    Time  --    Period  --    Status  Partially Met      SLP SHORT TERM GOAL #4   Title  pt will use abdominal breathing 75% of the time in sentence responses    Time  --    Period  --    Status  Achieved      SLP SHORT TERM GOAL #5   Title  pt will improve conversational loudness to low 70s dB average in 5 minutes simple conversation    Time  --    Period  --    Status  Not Met       SLP Long Term Goals - 10/29/17 [place] #1   Title  pt will complete HEP for dysphagia with modified independence in two sessions    Time  4    Period  Weeks    Status  On-going      SLP LONG TERM GOAL #2   Title  pt will complete HEP for dysarthria with modified independence in two sessions    Baseline  10-29-17    Time  4    Period  Weeks    Status  On-going      SLP LONG TERM GOAL #3   Title  pt will use abdominal breathing 70% of the time with rare min A in 10 mintues simple conversation over two sessions    Time  4    Period  Weeks    Status  On-going      SLP LONG TERM GOAL #4   Title  pt will use average low 70s dB conversational volume for 10 minutes in simple conversation    Time  4    Period  Weeks    Status  On-going       Plan - 10/29/17 1525    Clinical Impression Statement  Patient cont to present with mild-moderate dysarthria secondary to MS as well as to report mild dysphagia. SLP learned today pt not using supraglottic with 100% of swallows, nor was he really performing voice HEP for improved swallow function. See "skilled intervention" for further details. Pt would benefit from  skilled ST for training in swallow compensations, exercises for vocal fold adduction, respiratory muscle strength training and dysarthria interventions in order to improve communication and swallowing function.     Speech Therapy  Frequency  2x / week    Duration  -- 8 weeks or 17 visits total    Treatment/Interventions  Aspiration precaution training;Diet toleration management by SLP;Compensatory strategies;Cueing hierarchy;Functional tasks;SLP instruction and feedback;Patient/family education    Potential Considerations  Medical prognosis       Patient will benefit from skilled therapeutic intervention in order to improve the following deficits and impairments:   Dysphagia, pharyngeal phase  Dysarthria and anarthria    Problem List Patient Active Problem List   Diagnosis Date Noted  . 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 Abnormality of gait 11/21/2015  . Transient alteration of awareness 11/21/2015  . Meralgia paresthetica, tx by Dr. Erling Cruz 09/21/2012  . Neurogenic bladder 09/21/2012  . MS (multiple sclerosis), followed by Dr. Erling Cruz in neurology 08/04/2012    [facility] ,[place], [place]  10/29/2017, 3:27 PM  [facility] [address] [place], [place], [address] Phone: [phone]   Fax:  [phone]   Name: [name] MRN: [id number] Date of Birth: 05/03/1961

## 2017-10-29 NOTE — Patient Instructions (Signed)
Use your breath hold/swallow technique.  Do your voice exercises.

## 2017-11-01 ENCOUNTER — Ambulatory Visit: Payer: Medicare Other | Admitting: Speech Pathology

## 2017-11-01 DIAGNOSIS — R471 Dysarthria and anarthria: Secondary | ICD-10-CM

## 2017-11-01 DIAGNOSIS — R1313 Dysphagia, pharyngeal phase: Secondary | ICD-10-CM | POA: Diagnosis not present

## 2017-11-01 NOTE — Therapy (Signed)
[facility] [address] [place], [place], [address] Phone: [phone]   Fax:  [phone]  Speech Language Pathology Treatment  Patient Details  Name: [name] MRN: [id number] Date of Birth: 03-27-1961 Referring Provider: Dr.Willis   Encounter Date: 11/01/2017  End of Session - 11/01/17 1820    Visit Number  9    Number of Visits  17    Date for SLP Re-Evaluation  11/19/17    Authorization Type  MCR - needs PN every 10th visit    SLP Start Time  1317    SLP Stop Time   1400    SLP Time Calculation (min)  43 min    Activity Tolerance  Patient tolerated treatment well       Past Medical History:  Diagnosis Date  . Abnormality of gait 11/21/2015  . Asthma    childhood asthma  . Classic migraine   . Depression   . Dysphagia   . Hay fever   . MS (multiple sclerosis) ([place])   . 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 eye surgeries     x 2; bilateral 74 and 74    There were no vitals filed for this visit.  Subjective Assessment - 11/01/17 1810    Subjective  Pt reports completing exercises for VF adduction in addition to RMT    Patient is accompained by:  Family member    Currently in Pain?  Yes    Pain Score  5     Pain Location  Shoulder    Pain Orientation  Right    Pain Descriptors / Indicators  Stabbing    Pain Type  Chronic pain    Pain Radiating Towards  hand/wrist    Pain Onset  1 to 4 weeks ago    Pain Frequency  Constant    Aggravating Factors   Ocrevus            ADULT SLP TREATMENT - 11/01/17 1317      General Information   Behavior/Cognition  Alert;Cooperative;Pleasant mood    Patient Positioning  Upright in chair    Oral care provided  N/A      Treatment Provided   Treatment provided  Dysphagia;Cognitive-Linquistic      Dysphagia Treatment   Temperature Spikes Noted  No    Respiratory Status  Room air    Oral Cavity -  Dentition  Adequate natural dentition    Treatment Methods  Therapeutic exercise;Patient/caregiver education    Patient observed directly with PO's  No    Type of cueing  Verbal;Visual    Amount of cueing  Minimal    Other treatment/comments  SLP provided education re: rationale for swallow compensations and worked with pt to strategize/create visual reminder for mealtimes. In discussion with pt and his wife, they reported he is frequently eating in a semi-reclined position and pt reported occasionally "it feels like something is in my throat before I swallow." SLP educated re: optimal positioning for PO intake and added visual reminder for upright positioning to pt's swallow strategies, to be placed on table at mealtimes. Encouraged pt and wife to limit distractions during mealtimes for pt to focus on small, single sips, holding his breath during swallow.       Cognitive-Linquistic Treatment   Treatment focused on  Dysarthria    Skilled Treatment  Pt demo'd HEP for vocal fold adduction with occasional min A for technique (  cues for higher/lower pitch on pitch glides). 7 minutes simple conversation with rare min A for AB.      Assessment / Recommendations / Plan   Plan  Continue with current plan of care      General Recommendations   Oral Care Recommendations  Oral care BID      Progression Toward Goals   Progression toward goals  Progressing toward goals      General Information   Reason PO's not observed  Other (comment)       SLP Education - 11/01/17 1819    Education provided  Yes    Education Details  risks of aspiration, use visual reminder and limit distractions at mealtimes to build a routine with using swallow precautions    Person(s) Educated  Patient;Spouse    Methods  Explanation;Handout    Comprehension  Verbalized understanding       SLP Short Term Goals - 11/01/17 1821      SLP SHORT TERM GOAL #1   Title  pt will demo swallow precautions with POs independently      Status  Not Met      SLP SHORT TERM GOAL #2   Title  pt will complete his HEP for dysphagia with rare min A over 2 visits    Status  Partially Met      SLP [place] #3   Title  pt will complete his HEP for dysarthria with rare min A over 2 visits    Status  Partially Met      SLP SHORT TERM GOAL #4   Title  pt will use abdominal breathing 75% of the time in sentence responses    Status  Achieved      SLP SHORT TERM GOAL #5   Title  pt will improve conversational loudness to low 70s dB average in 5 minutes simple conversation    Status  Not Met       SLP Long Term Goals - 11/01/17 1822      SLP LONG TERM GOAL #1   Title  pt will complete HEP for dysphagia with modified independence in two sessions    Time  4    Period  Weeks    Status  On-going      SLP LONG TERM GOAL #2   Title  pt will complete HEP for dysarthria with modified independence in two sessions    Baseline  10-29-17    Time  4    Period  Weeks    Status  On-going      SLP LONG TERM GOAL #3   Title  pt will use abdominal breathing 70% of the time with rare min A in 10 mintues simple conversation over two sessions    Time  4    Period  Weeks    Status  On-going      SLP LONG TERM GOAL #4   Title  pt will use average low 70s dB conversational volume for 10 minutes in simple conversation    Time  4    Period  Weeks    Status  On-going       Plan - 11/01/17 1820    Clinical Impression Statement  Patient cont to present with mild-moderate dysarthria secondary to MS as well as to report mild dysphagia. Pt reports improved compliance with voice HEP; visual reminder provided for supraglottic and swallow precautions. See "skilled intervention" for further details. Pt would benefit from skilled ST for  training in swallow compensations, exercises for vocal fold adduction, respiratory muscle strength training and dysarthria interventions in order to improve communication and swallowing function.     Speech  Therapy Frequency  2x / week    Treatment/Interventions  Aspiration precaution training;Diet toleration management by SLP;Compensatory strategies;Cueing hierarchy;Functional tasks;SLP instruction and feedback;Patient/family education    Potential to Achieve Goals  Good    Potential Considerations  Medical prognosis    Consulted and Agree with Plan of Care  Patient       Patient will benefit from skilled therapeutic intervention in order to improve the following deficits and impairments:   Dysphagia, pharyngeal phase  Dysarthria and anarthria    Problem List Patient Active Problem List   Diagnosis Date Noted  . 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 Abnormality of gait 11/21/2015  . Transient alteration of awareness 11/21/2015  . Meralgia paresthetica, tx by Dr. Erling Cruz 09/21/2012  . Neurogenic bladder 09/21/2012  . MS (multiple sclerosis), followed by Dr. Erling Cruz in neurology 08/04/2012   [facility] Lever, [place], [place] 11/01/2017, 6:23 PM  [facility] [address] [place], [place], [address] Phone: [phone]   Fax:  [phone]   Name: [name] MRN: [id number] Date of Birth: 1961/06/06

## 2017-11-04 ENCOUNTER — Ambulatory Visit: Payer: Medicare Other | Admitting: Speech Pathology

## 2017-11-04 DIAGNOSIS — R471 Dysarthria and anarthria: Secondary | ICD-10-CM

## 2017-11-04 DIAGNOSIS — R1313 Dysphagia, pharyngeal phase: Secondary | ICD-10-CM

## 2017-11-04 NOTE — Therapy (Signed)
[facility] [address] [place], [place], [address] Phone: [phone]   Fax:  [phone]  Speech Language Pathology Treatment  Patient Details  Name: [name] MRN: [id number] Date of Birth: May 31, 1961 Referring Provider: Dr.Willis   Encounter Date: 11/04/2017  End of Session - 11/04/17 1816    Visit Number  10    Number of Visits  17    Date for SLP Re-Evaluation  11/19/17    Authorization Type  MCR - needs PN every 10th visit    SLP Start Time  1316    SLP Stop Time   1400    SLP Time Calculation (min)  44 min    Activity Tolerance  Patient tolerated treatment well       Past Medical History:  Diagnosis Date  . Abnormality of gait 11/21/2015  . Asthma    childhood asthma  . Classic migraine   . Depression   . Dysphagia   . Hay fever   . MS (multiple sclerosis) ([place])   . 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 eye surgeries     x 2; bilateral 74 and 76    There were no vitals filed for this visit.  Subjective Assessment - 11/04/17 1320    Subjective  "I did the 90 degree thing."    Patient is accompained by:  Family member    Currently in Pain?  Yes    Pain Score  6     Pain Location  Shoulder    Pain Orientation  Right    Pain Descriptors / Indicators  Stabbing    Pain Type  Chronic pain    Pain Onset  1 to 4 weeks ago    Pain Frequency  Constant    Pain Relieving Factors  massage            ADULT SLP TREATMENT - 11/04/17 1317      General Information   Behavior/Cognition  Alert;Cooperative;Pleasant mood    Patient Positioning  Upright in chair      Treatment Provided   Treatment provided  Cognitive-Linquistic      Dysphagia Treatment   Temperature Spikes Noted  No    Respiratory Status  Room air    Oral Cavity - Dentition  Adequate natural dentition    Treatment Methods  Therapeutic exercise;Patient/caregiver education    Patient observed directly with PO's  No    Other treatment/comments  Pt reports no coughing episodes with meals since last session 3 days ago. He has been making an effort to sit upright 90 degrees vs semi-reclined. Pt demo'd HEP for respiratory muscle strength training, occasional mod A required (hands on shoulders) to reduce excessive chest/shoulder movements with inspiratory trainer. Pt reported an episode coughing/choking on medication several weeks ago; pt's wife reports he is typically taking his medications dry. He reports frequent globus sensation for "an hour or so" after taking pills. SLP reviewed recommendation from FEES to take pills with puree. Encouraged pt to try whole with a teaspoon of applesauce to aid clearance.       Cognitive-Linquistic Treatment   Treatment focused on  Dysarthria    Skilled Treatment  Demo'd HEP for vocal fold adduction, today independent in all exercises with exception of /a/ while bearing down/pulling up. SLP provided min cues to reduce accessory muscle/neck tension with focus on abdominal effort.      Assessment / Recommendations / Plan  Plan  Continue with current plan of care      Dysphagia Recommendations   Diet recommendations  Regular;Thin liquid    Liquids provided via  Cup    Medication Administration  Whole meds with puree    Supervision  Patient able to self feed    Compensations  Slow rate;Small sips/bites    Postural Changes and/or Swallow Maneuvers  Hold breath before and during swallow (Supraglottic swallow)      Progression Toward Goals   Progression toward goals  Progressing toward goals      General Information   Reason PO's not observed  Other (comment) education, therapeutic exercise       SLP Education - 11/04/17 1816    Education provided  Yes    Education Details  take medications whole with puree    Person(s) Educated  Patient;Spouse    Methods  Explanation    Comprehension  Verbalized understanding       SLP Short  Term Goals - 11/04/17 1809      SLP SHORT TERM GOAL #1   Title  pt will demo swallow precautions with POs independently     Status  Not Met      SLP SHORT TERM GOAL #2   Title  pt will complete his HEP for dysphagia with rare min A over 2 visits    Status  Partially Met      SLP SHORT TERM GOAL #3   Title  pt will complete his HEP for dysarthria with rare min A over 2 visits    Status  Partially Met      SLP SHORT TERM GOAL #4   Title  pt will use abdominal breathing 75% of the time in sentence responses    Status  Achieved      SLP SHORT TERM GOAL #5   Title  pt will improve conversational loudness to low 70s dB average in 5 minutes simple conversation    Status  Not Met       SLP Long Term Goals - 11/04/17 1809      SLP LONG TERM GOAL #1   Title  pt will complete HEP for dysphagia with modified independence in two sessions    Time  4    Period  Weeks    Status  On-going      SLP LONG TERM GOAL #2   Title  pt will complete HEP for dysarthria with modified independence in two sessions    Baseline  10-29-17    Time  4    Period  Weeks    Status  On-going      SLP LONG TERM GOAL #3   Title  pt will use abdominal breathing 70% of the time with rare min A in 10 mintues simple conversation over two sessions    Time  4    Period  Weeks    Status  On-going      SLP LONG TERM GOAL #4   Title  pt will use average low 70s dB conversational volume for 10 minutes in simple conversation    Time  4    Period  Weeks    Status  On-going       Plan - 11/04/17 1816    Clinical Impression Statement  Patient cont to present with mild-moderate dysarthria secondary to MS as well as to report mild dysphagia. Pt reports improved compliance with voice HEP; is using visual reminder for supraglottic and swallow precautions. See "  skilled intervention" for further details. Pt would benefit from skilled ST for training in swallow compensations, exercises for vocal fold adduction, respiratory  muscle strength training and dysarthria interventions in order to improve communication and swallowing function.     Speech Therapy Frequency  2x / week    Treatment/Interventions  Aspiration precaution training;Diet toleration management by SLP;Compensatory strategies;Cueing hierarchy;Functional tasks;SLP instruction and feedback;Patient/family education    Potential to Achieve Goals  Good    Potential Considerations  Medical prognosis    Consulted and Agree with Plan of Care  Patient       Patient will benefit from skilled therapeutic intervention in order to improve the following deficits and impairments:   Dysphagia, pharyngeal phase  Dysarthria and anarthria    Problem List Patient Active Problem List   Diagnosis Date Noted  . 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 Abnormality of gait 11/21/2015  . Transient alteration of awareness 11/21/2015  . Meralgia paresthetica, tx by Dr. Erling Cruz 09/21/2012  . Neurogenic bladder 09/21/2012  . MS (multiple sclerosis), followed by Dr. Erling Cruz in neurology 08/04/2012    Speech Therapy Progress Note  Dates of Reporting Period: 09/15/17 to 11/04/17  Subjective Statement: Pt has been seen for 10 speech therapy visits addressing dysphagia and dysarthria secondary to MS.  Objective Measurements: Pt folowing 80% of HEP for dysphagia and dysarthria with modified independence, cuing remains necessary for 20% of exercises.   Goal Update: Progressing; see goals above  Plan: Continue with plan as above  Reason Skilled Services are Required: Skilled ST remains necessary for continued training in swallow compensations and precautions, exercises for vocal fold adduction, respiratory muscle strength training and dysarthria interventions in order to improve communication and swallowing function.    Deneise Lever, [place], CCC-SLP Speech-Language Pathologist  Aliene Altes 11/04/2017, 6:18 PM  [facility] [address] [place], [place], [address] Phone: [phone]   Fax:  [phone]   Name: DRESHON PROFFIT MRN: [id number] Date of Birth: May 31, 1961

## 2017-11-08 ENCOUNTER — Ambulatory Visit: Payer: Medicare Other | Admitting: Speech Pathology

## 2017-11-11 ENCOUNTER — Ambulatory Visit: Payer: Medicare Other

## 2017-11-11 DIAGNOSIS — R1313 Dysphagia, pharyngeal phase: Secondary | ICD-10-CM

## 2017-11-11 DIAGNOSIS — R471 Dysarthria and anarthria: Secondary | ICD-10-CM

## 2017-11-11 NOTE — Therapy (Signed)
[facility] [address] [place], [place], [address] Phone: [phone]   Fax:  [phone]  Speech Language Pathology Treatment  Patient Details  Name: [name] MRN: [id number] Date of Birth: February 01, 1961 Referring Provider: Dr.Willis   Encounter Date: 11/11/2017  End of Session - 11/11/17 1444    Visit Number  11    Number of Visits  17    Date for SLP Re-Evaluation  12/17/17    SLP Start Time  3220    SLP Stop Time   1402    SLP Time Calculation (min)  45 min    Activity Tolerance  Patient tolerated treatment well       Past Medical History:  Diagnosis Date  . Abnormality of gait 11/21/2015  . Asthma    childhood asthma  . Classic migraine   . Depression   . Dysphagia   . Hay fever   . MS (multiple sclerosis) ([place])   . 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 eye surgeries     x 2; bilateral 74 and 74    There were no vitals filed for this visit.  Subjective Assessment - 11/11/17 1418    Subjective  Pt's speech (subjectivdly) sounds louder than last session SLP saw pt.    Patient is accompained by:  Family member    Currently in Pain?  No/denies    Pain Onset  1 to 4 weeks ago            ADULT SLP TREATMENT - 11/11/17 1348      General Information   Behavior/Cognition  Alert;Cooperative;Pleasant mood      Treatment Provided   Treatment provided  Cognitive-Linquistic      Dysphagia Treatment   Temperature Spikes Noted  No    Respiratory Status  Room air    Oral Cavity - Dentition  Adequate natural dentition    Treatment Methods  Therapeutic exercise    Patient observed directly with PO's  No    Other treatment/comments  Pt completed dysphagia HEP with modified independence. Pt stated he has been having meds with liquids and having much less dificulty with globus. SLP reminded pt of puree recommendation.       Cognitive-Linquistic  Treatment   Treatment focused on  Dysarthria    Skilled Treatment  Pt read sentences with compensations 80% of the time, in conversation pt demonstrated awareness of decr'd intelligibility by increasing compensations with unintelligible or less-intelligible speech x4. He demonstrated HEP for dysarthria with independence, with abdominal breathing at least 70% in conversation of 5 minutes. Pt average loudness was in the low 70s with simple conversation for 5 minutes with occasional min A for loudness/effort.      Assessment / Recommendations / Plan   Plan  Continue with current plan of care;Goals updated      Dysphagia Recommendations   Diet recommendations  Thin liquid;Regular    Liquids provided via  Cup    Medication Administration  Whole meds with puree    Supervision  Patient able to self feed    Compensations  Slow rate;Small sips/bites    Postural Changes and/or Swallow Maneuvers  Hold breath before and during swallow (Supraglottic swallow)      Progression Toward Goals   Progression toward goals  Progressing toward goals         SLP Short Term Goals - 11/04/17 1809      SLP SHORT  TERM GOAL #1   Title  pt will demo swallow precautions with POs independently     Status  Not Met      SLP SHORT TERM GOAL #2   Title  pt will complete his HEP for dysphagia with rare min A over 2 visits    Status  Partially Met      SLP SHORT TERM GOAL #3   Title  pt will complete his HEP for dysarthria with rare min A over 2 visits    Status  Partially Met      SLP SHORT TERM GOAL #4   Title  pt will use abdominal breathing 75% of the time in sentence responses    Status  Achieved      SLP SHORT TERM GOAL #5   Title  pt will improve conversational loudness to low 70s dB average in 5 minutes simple conversation    Status  Not Met       SLP Long Term Goals - 11/11/17 1335      SLP LONG TERM GOAL #1   Title  pt will complete HEP for dysphagia with modified independence in two sessions     Baseline  11-11-17    Time  4 LTGs renewed 11-11-17    Period  Weeks    Status  Partially Met and ongoing      SLP LONG TERM GOAL #2   Title  pt will complete HEP for dysarthria with modified independence in two sessions    Baseline  --    Time  --    Period  --    Status  Achieved      SLP LONG TERM GOAL #3   Title  pt will use abdominal breathing 70% of the time with rare min A in 10 mintues simple conversation over two sessions    Time  4    Period  Weeks    Status  Partially Met and ongoing      SLP LONG TERM GOAL #4   Title  pt will use average low 70s dB conversational volume for 10 minutes in simple conversation    Time  4    Period  Weeks    Status  Partially Met and ongoing       Plan - 11/11/17 1445    Clinical Impression Statement  Patient cont to present with 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 Pt reports cont'd improved compliance with voice HEP; is using liquids with med administration with good results. SLP reminded pt of rec from last session to use puree. See "skilled intervention" for further details. Suspect pt will need approx 4-6 more sessions for cont'd benefit from skilled ST for cont'd training in swallow compensations, exercises for vocal fold adduction for dysphagia and dysarthria, and with respiratory muscle strength training and dysarthria interventions in order to improve communication.    Speech Therapy Frequency  2x / week    Duration  4 weeks    Treatment/Interventions  Aspiration precaution training;Diet toleration management by SLP;Compensatory strategies;Cueing hierarchy;Functional tasks;SLP instruction and feedback;Patient/family education    Potential to Achieve Goals  Good    Potential Considerations  Medical prognosis    Consulted and Agree with Plan of Care  Patient       Patient will benefit from skilled therapeutic intervention in order to improve the following deficits and impairments:    Dysphagia, pharyngeal phase  Dysarthria and anarthria    Problem  List Patient Active Problem List   Diagnosis Date Noted  . 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 Abnormality of gait 11/21/2015  . Transient alteration of awareness 11/21/2015  . Meralgia paresthetica, tx by Dr. Erling Cruz 09/21/2012  . Neurogenic bladder 09/21/2012  . MS (multiple sclerosis), followed by Dr. Erling Cruz in neurology 08/04/2012    [facility] ,[place], [place]  11/11/2017, 2:51 PM  [facility] [address] [place], [place], [address] Phone: [phone]   Fax:  [phone]   Name: [name] MRN: [id number] Date of Birth: Mar 17, 1961

## 2017-11-16 ENCOUNTER — Ambulatory Visit: Payer: Medicare Other | Attending: Neurology

## 2017-11-16 DIAGNOSIS — R471 Dysarthria and anarthria: Secondary | ICD-10-CM | POA: Diagnosis not present

## 2017-11-16 DIAGNOSIS — R1313 Dysphagia, pharyngeal phase: Secondary | ICD-10-CM | POA: Insufficient documentation

## 2017-11-16 NOTE — Therapy (Signed)
[facility] [address] [place], [place], [address] Phone: [phone]   Fax:  [phone]  Speech Language Pathology Treatment  Patient Details  Name: [name] MRN: [id number] Date of Birth: 26-Sep-1961 Referring Provider: Dr.Willis   Encounter Date: 11/16/2017  End of Session - 11/16/17 1734    Visit Number  12    Number of Visits  17    Date for SLP Re-Evaluation  12/17/17    SLP Start Time  46    SLP Stop Time   1400    SLP Time Calculation (min)  42 min    Activity Tolerance  Patient tolerated treatment well       Past Medical History:  Diagnosis Date  . Abnormality of gait 11/21/2015  . Asthma    childhood asthma  . Classic migraine   . Depression   . Dysphagia   . Hay fever   . MS (multiple sclerosis) ([place])   . 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 eye surgeries     x 2; bilateral 74 and 74    There were no vitals filed for this visit.  Subjective Assessment - 11/16/17 1335    Subjective  Pt speech (subjectively) sounds not unlike previous sessions.     Patient is accompained by:  Family member wife    Currently in Pain?  No/denies            ADULT SLP TREATMENT - 11/16/17 1342      General Information   Behavior/Cognition  Alert;Cooperative;Pleasant mood      Treatment Provided   Treatment provided  Cognitive-Linquistic      Cognitive-Linquistic Treatment   Treatment focused on  Dysarthria    Skilled Treatment  Pt unscrambled sentences with overarticulation 80% success. In question/answer task pt maintained overarticulation, stronger voice and volume, and abdominal breathing with 75%, 80%, and 60% (although difficult to gauge with pt blanket) respectively. When pt talking to wife for side-comments, intelligibility decreased. SLP made pt aware of this and pt made more effort to use clearer speech compensations with wife for last  15 mintues with variable success. Told pt today he should cont to complete exercises for approx another 4 weeks (6 weeks max) and we should have improvement for strength by that time.       Assessment / Recommendations / Plan   Plan  Continue with current plan of care;Goals updated      Progression Toward Goals   Progression toward goals  Progressing toward goals       SLP Education - 11/16/17 1733    Education provided  Yes    Education Details  one goal for tx is to have clearer speech at home    Person(s) Educated  Patient    Methods  Explanation    Comprehension  Verbalized understanding;Returned demonstration       SLP Short Term Goals - 11/04/17 1809      SLP SHORT TERM GOAL #1   Title  pt will demo swallow precautions with POs independently     Status  Not Met      SLP SHORT TERM GOAL #2   Title  pt will complete his HEP for dysphagia with rare min A over 2 visits    Status  Partially Met      SLP SHORT TERM GOAL #3   Title  pt will complete his HEP for dysarthria with rare  min A over 2 visits    Status  Partially Met      SLP SHORT TERM GOAL #4   Title  pt will use abdominal breathing 75% of the time in sentence responses    Status  Achieved      SLP SHORT TERM GOAL #5   Title  pt will improve conversational loudness to low 70s dB average in 5 minutes simple conversation    Status  Not Met       SLP Long Term Goals - 11/16/17 1327      SLP LONG TERM GOAL #1   Title  pt will complete HEP for dysphagia with modified independence in two sessions    Baseline  11-11-17    Time  4 LTGs renewed 11-11-17    Period  Weeks    Status  On-going and ongoing      SLP LONG TERM GOAL #2   Title  pt will complete HEP for dysarthria with modified independence in two sessions    Status  Achieved      SLP LONG TERM GOAL #3   Title  pt will use abdominal breathing 70% of the time with rare min A in 10 mintues simple conversation over two sessions    Time  4    Period   Weeks    Status  On-going and ongoing      SLP LONG TERM GOAL #4   Title  pt will use average low 70s dB conversational volume for 10 minutes in simple conversation    Time  4    Period  Weeks    Status  On-going and ongoing       Plan - 11/16/17 1734    Clinical Impression Statement  Patient cont to present with 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 Pt reports cont'd improved compliance with HEPs; SLP told pt today cont for 4-6 weeks with these. See "skilled intervention" for further details. Suspect pt will need approx 4-6 more sessions for cont'd benefit from skilled ST for cont'd training in swallow compensations, exercises for vocal fold adduction for dysphagia and dysarthria, and with respiratory muscle strength training and dysarthria interventions in order to improve communication.    Speech Therapy Frequency  2x / week    Duration  4 weeks    Treatment/Interventions  Aspiration precaution training;Diet toleration management by SLP;Compensatory strategies;Cueing hierarchy;Functional tasks;SLP instruction and feedback;Patient/family education    Potential to Achieve Goals  Good    Potential Considerations  Medical prognosis    Consulted and Agree with Plan of Care  Patient       Patient will benefit from skilled therapeutic intervention in order to improve the following deficits and impairments:   Dysphagia, pharyngeal phase  Dysarthria and anarthria    Problem List Patient Active Problem List   Diagnosis Date Noted  . 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 Abnormality of gait 11/21/2015  . Transient alteration of awareness 11/21/2015  . Meralgia paresthetica, tx by Dr. Erling Cruz 09/21/2012  . Neurogenic bladder 09/21/2012  . MS (multiple sclerosis), followed by Dr. Erling Cruz in neurology 08/04/2012    [facility] ,[place], [place]  11/16/2017, 5:37 PM  [facility] [address] [place], [place], [address] Phone: [phone]   Fax:  [phone]   Name: JOSE CORVIN MRN: [id number] Date of Birth: 18-Sep-1961

## 2017-11-16 NOTE — Patient Instructions (Signed)
  Please complete the assigned speech therapy homework prior to your next session and return it to the speech therapist at your next visit. Talk in bigger voice at home!

## 2017-11-18 ENCOUNTER — Ambulatory Visit: Payer: Medicare Other | Admitting: Speech Pathology

## 2017-11-18 DIAGNOSIS — R471 Dysarthria and anarthria: Secondary | ICD-10-CM | POA: Diagnosis not present

## 2017-11-18 DIAGNOSIS — R1313 Dysphagia, pharyngeal phase: Secondary | ICD-10-CM | POA: Diagnosis not present

## 2017-11-18 NOTE — Therapy (Signed)
[facility] [address] [place], [place], [address] Phone: [phone]   Fax:  [phone]  Speech Language Pathology Treatment  Patient Details  Name: [name] MRN: [id number] Date of Birth: Nov 22, 1960 Referring Provider: Dr.Willis   Encounter Date: 11/18/2017  End of Session - 11/18/17 1713    Visit Number  13    Number of Visits  17    Date for SLP Re-Evaluation  12/17/17    SLP Start Time  1319    SLP Stop Time   1405    SLP Time Calculation (min)  46 min    Activity Tolerance  Patient tolerated treatment well       Past Medical History:  Diagnosis Date  . Abnormality of gait 11/21/2015  . Asthma    childhood asthma  . Classic migraine   . Depression   . Dysphagia   . Hay fever   . MS (multiple sclerosis) ([facility])   . 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 eye surgeries     x 2; bilateral 74 and 74    There were no vitals filed for this visit.  Subjective Assessment - 11/18/17 1320    Subjective  "I'm trying to over-enunciate"    Currently in Pain?  Yes    Pain Score  5     Pain Location  Neck    Pain Orientation  Right    Pain Descriptors / Indicators  Stabbing    Pain Type  Chronic pain    Pain Radiating Towards  shoulder    Pain Onset  1 to 4 weeks ago    Pain Frequency  Constant            ADULT SLP TREATMENT - 11/18/17 1317      General Information   Behavior/Cognition  Alert;Cooperative;Pleasant mood    Patient Positioning  Upright in chair      Treatment Provided   Treatment provided  Cognitive-Linquistic      Cognitive-Linquistic Treatment   Treatment focused on  Dysarthria    Skilled Treatment  Pt, wife told SLP pt has been increasing his awareness decreased intelligibility. Pt generated sentences with 2 given words while maintaining overarticulation (90%), stronger, louder voice (90%) and abdominal breathing (75%). In  side comments to wife and in spontaneous responses to SLP, noted decreased carryover of these strategies. When SLP pointed out to pt, he began self-monitoring for maintaining intelligibility strategies between structured speech tasks. With longer utterances and in simple conversation, pt has greater success with overarticulation and increased vocal amplitude (~80% accuracy) than abdominal breathing (~60%). Pt demo'd his HEP for dysarthria / vocal fold adduction with modified independence (handout).      Assessment / Recommendations / Plan   Plan  Continue with current plan of care;Goals updated      Progression Toward Goals   Progression toward goals  Progressing toward goals         SLP Short Term Goals - 11/18/17 1714      SLP SHORT TERM GOAL #1   Title  pt will demo swallow precautions with POs independently     Status  Not Met      SLP SHORT TERM GOAL #2   Title  pt will complete his HEP for dysphagia with rare min A over 2 visits    Status  Partially Met      SLP SHORT TERM GOAL #3  Title  pt will complete his HEP for dysarthria with rare min A over 2 visits    Status  Partially Met      SLP SHORT TERM GOAL #4   Title  pt will use abdominal breathing 75% of the time in sentence responses    Status  Achieved      SLP SHORT TERM GOAL #5   Title  pt will improve conversational loudness to low 70s dB average in 5 minutes simple conversation    Status  Not Met       SLP Long Term Goals - 11/18/17 1714      SLP LONG TERM GOAL #1   Title  pt will complete HEP for dysphagia with modified independence in two sessions    Baseline  11-11-17    Time  4    Period  Weeks    Status  On-going      SLP LONG TERM GOAL #2   Title  pt will complete HEP for dysarthria with modified independence in two sessions    Time  4    Period  Weeks    Status  Achieved      SLP LONG TERM GOAL #3   Title  pt will use abdominal breathing 70% of the time with rare min A in 10 mintues simple  conversation over two sessions    Time  4    Period  Weeks    Status  On-going      SLP LONG TERM GOAL #4   Title  pt will use average low 70s dB conversational volume for 10 minutes in simple conversation    Time  4    Period  Weeks    Status  On-going       Plan - 11/18/17 1713    Clinical Impression Statement  Patient cont to present with 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 Pt reports cont'd improved compliance with HEPs as well as increased awareness of intelligibility strategies at home. See "skilled intervention" for further details. Suspect pt will need approx 3-5 more sessions for cont'd benefit from skilled ST for cont'd training in swallow compensations, exercises for vocal fold adduction for dysphagia and dysarthria, and with respiratory muscle strength training and dysarthria interventions in order to improve communication.     Speech Therapy Frequency  2x / week    Duration  4 weeks    Treatment/Interventions  Aspiration precaution training;Diet toleration management by SLP;Compensatory strategies;Cueing hierarchy;Functional tasks;SLP instruction and feedback;Patient/family education    Potential to Achieve Goals  Good    Potential Considerations  Medical prognosis    Consulted and Agree with Plan of Care  Patient       Patient will benefit from skilled therapeutic intervention in order to improve the following deficits and impairments:   Dysarthria and anarthria    Problem List Patient Active Problem List   Diagnosis Date Noted  . 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 Abnormality of gait 11/21/2015  . Transient alteration of awareness 11/21/2015  . Meralgia paresthetica, tx by Dr. Erling Cruz 09/21/2012  . Neurogenic bladder 09/21/2012  . MS (multiple sclerosis), followed by Dr. Erling Cruz in neurology 08/04/2012    Deneise Lever, [place], [place] 11/18/2017, 5:16 PM  [facility] [address] [place], [place], [address] Phone: [phone]   Fax:  [phone]   Name: DERWOOD BECRAFT MRN: [id number]  Date of Birth: 1961-09-18

## 2017-11-23 ENCOUNTER — Ambulatory Visit: Payer: Medicare Other

## 2017-11-23 DIAGNOSIS — R1313 Dysphagia, pharyngeal phase: Secondary | ICD-10-CM

## 2017-11-23 DIAGNOSIS — R471 Dysarthria and anarthria: Secondary | ICD-10-CM | POA: Diagnosis not present

## 2017-11-23 NOTE — Patient Instructions (Signed)
Continue with IMT and EMT devices.

## 2017-11-23 NOTE — Therapy (Signed)
[facility] [address] [place], [place], [address] Phone: [phone]   Fax:  [phone]  Speech Language Pathology Treatment  Patient Details  Name: [name] MRN: [id number] Date of Birth: 1961-08-23 Referring Provider: Dr.Willis   Encounter Date: 11/23/2017  End of Session - 11/23/17 1535    Visit Number  14    Number of Visits  17    Date for SLP Re-Evaluation  12/17/17    SLP Start Time  60    SLP Stop Time   1615    SLP Time Calculation (min)  41 min    Activity Tolerance  Patient tolerated treatment well       Past Medical History:  Diagnosis Date  . Abnormality of gait 11/21/2015  . Asthma    childhood asthma  . Classic migraine   . Depression   . Dysphagia   . Hay fever   . MS (multiple sclerosis) ([facility])   . 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 eye surgeries     x 2; bilateral 72 and 74    There were no vitals filed for this visit.         ADULT SLP TREATMENT - 11/23/17 1550      General Information   Behavior/Cognition  Alert;Cooperative;Pleasant mood      Treatment Provided   Treatment provided  Cognitive-Linquistic      Cognitive-Linquistic Treatment   Treatment focused on  Dysarthria    Skilled Treatment  Pt completed IMT and EMT with SLP today; noted fatigue after rep 3 of set 3 of EMT and rep 7 of IMT of set 2. Pt states he, generally, has gotten stronger with these exercises, however can rarely fluctuate due to "having a bad day." In conversation, pt remained 95% intelligible in 15 minutes conversation and improved intelligibility when given nonverbal cue from SLP, to 100% by slowing rate and overartiulating.       Assessment / Recommendations / Plan   Plan  Continue with current plan of care      Progression Toward Goals   Progression toward goals  Progressing toward goals         SLP Short Term Goals -  11/18/17 1714      SLP SHORT TERM GOAL #1   Title  pt will demo swallow precautions with POs independently     Status  Not Met      SLP SHORT TERM GOAL #2   Title  pt will complete his HEP for dysphagia with rare min A over 2 visits    Status  Partially Met      SLP SHORT TERM GOAL #3   Title  pt will complete his HEP for dysarthria with rare min A over 2 visits    Status  Partially Met      SLP SHORT TERM GOAL #4   Title  pt will use abdominal breathing 75% of the time in sentence responses    Status  Achieved      SLP SHORT TERM GOAL #5   Title  pt will improve conversational loudness to low 70s dB average in 5 minutes simple conversation    Status  Not Met       SLP Long Term Goals - 11/23/17 1639      SLP LONG TERM GOAL #1   Title  pt will complete HEP for dysphagia with modified independence in two sessions  Baseline  11-11-17    Time  3    Period  Weeks    Status  On-going      SLP LONG TERM GOAL #2   Title  pt will complete HEP for dysarthria with modified independence in two sessions    Time  3    Period  Weeks    Status  Achieved      SLP LONG TERM GOAL #3   Title  pt will use abdominal breathing 70% of the time with rare min A in 10 mintues simple conversation over two sessions    Time  3    Period  Weeks    Status  On-going      SLP LONG TERM GOAL #4   Title  pt will use average low 70s dB conversational volume for 10 minutes in simple conversation    Time  3    Period  Weeks    Status  On-going       Plan - 11/23/17 1638    Clinical Impression Statement  Patient cont to present with 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 Pt reports he completes HEPs as well as works on intelligibility strategies at home. See "skilled intervention" for further details. Pt to cont with skilled ST for training in swallow compensations, exercises for vocal fold adduction for dysphagia and dysarthria, and with respiratory muscle  strength training and dysarthria interventions in order to improve communication.     Speech Therapy Frequency  2x / week    Duration  4 weeks    Treatment/Interventions  Aspiration precaution training;Diet toleration management by SLP;Compensatory strategies;Cueing hierarchy;Functional tasks;SLP instruction and feedback;Patient/family education    Potential to Achieve Goals  Good    Potential Considerations  Medical prognosis    Consulted and Agree with Plan of Care  Patient       Patient will benefit from skilled therapeutic intervention in order to improve the following deficits and impairments:   Dysarthria and anarthria  Dysphagia, pharyngeal phase    Problem List Patient Active Problem List   Diagnosis Date Noted  . 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 Abnormality of gait 11/21/2015  . Transient alteration of awareness 11/21/2015  . Meralgia paresthetica, tx by Dr. Erling Cruz 09/21/2012  . Neurogenic bladder 09/21/2012  . MS (multiple sclerosis), followed by Dr. Erling Cruz in neurology 08/04/2012    [facility] ,[place], [place]  11/23/2017, 4:39 PM  [facility] [address] [place], [place], [address] Phone: [phone]   Fax:  [phone]   Name: [name] MRN: [id number] Date of Birth: 25-Jul-1961

## 2017-11-29 ENCOUNTER — Ambulatory Visit: Payer: Medicare Other | Admitting: Speech Pathology

## 2017-11-29 DIAGNOSIS — R1313 Dysphagia, pharyngeal phase: Secondary | ICD-10-CM

## 2017-11-29 DIAGNOSIS — R471 Dysarthria and anarthria: Secondary | ICD-10-CM

## 2017-11-29 NOTE — Therapy (Signed)
[facility] [address] [place], [place], [address] Phone: [phone]   Fax:  [phone]  Speech Language Pathology Treatment  Patient Details  Name: [name] MRN: [id number] Date of Birth: [birth year] Referring Provider: Dr.Willis   Encounter Date: 11/29/2017  End of Session - 11/29/17 1710    Visit Number  15    Number of Visits  17    Date for SLP Re-Evaluation  12/17/17    Authorization Type  MCR - needs PN every 10th visit    SLP Start Time  1615    SLP Stop Time   1700    SLP Time Calculation (min)  45 min    Activity Tolerance  Patient tolerated treatment well       Past Medical History:  Diagnosis Date  . Abnormality of gait 11/21/2015  . Asthma    childhood asthma  . Classic migraine   . Depression   . Dysphagia   . Hay fever   . MS (multiple sclerosis) ([facility])   . 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 eye surgeries     x 2; bilateral 72 and 74    There were no vitals filed for this visit.  Subjective Assessment - 11/29/17 1620    Subjective  "It's a toss-up between just OK and cruddy."    Currently in Pain?  Yes    Pain Score  6     Pain Location  Neck    Pain Orientation  Right    Pain Descriptors / Indicators  Stabbing    Pain Type  Chronic pain    Pain Radiating Towards  shoulder    Pain Onset  More than a month ago    Pain Frequency  Constant    Aggravating Factors   stress about upcoming travel            ADULT SLP TREATMENT - 11/29/17 1615      General Information   Behavior/Cognition  Alert;Cooperative;Pleasant mood    Patient Positioning  Upright in chair      Treatment Provided   Treatment provided  Cognitive-Linquistic      Cognitive-Linquistic Treatment   Treatment focused on  Dysarthria    Skilled Treatment  Pt completed IMT and EMT with SLP today; fatigue noted with rep 5 of 3rd, 4th sets with EMT. Pt  reported he feels IMT has become easy; he demo'd 2 sets of 5 reps without fatigue. SLP educated pt on how to increase resistance, which he did with min A. Resistance increased to 33 cm H20 and pt completed 2 additional sets, with fatigue noted with final repetition of final set. Intelligibility >95% in 15 minutes conversation x2, and 75% accuracy for abdominal breathing with initial instruction for use of AB, intelligibility strategies.      Assessment / Recommendations / Plan   Plan  Continue with current plan of care      Progression Toward Goals   Progression toward goals  Progressing toward goals       SLP Education - 11/29/17 1711    Education provided  Yes    Education Details  how to increase intensity for inspiratory trainer if more resistance needed    Person(s) Educated  Patient;Spouse    Methods  Explanation    Comprehension  Returned demonstration       SLP Short Term Goals - 11/29/17 1709      SLP  SHORT TERM GOAL #1   Title  pt will demo swallow precautions with POs independently     Status  Not Met      SLP SHORT TERM GOAL #2   Title  pt will complete his HEP for dysphagia with rare min A over 2 visits    Status  Partially Met      SLP SHORT TERM GOAL #3   Title  pt will complete his HEP for dysarthria with rare min A over 2 visits    Status  Partially Met      SLP SHORT TERM GOAL #4   Title  pt will use abdominal breathing 75% of the time in sentence responses    Status  Achieved      SLP SHORT TERM GOAL #5   Title  pt will improve conversational loudness to low 70s dB average in 5 minutes simple conversation    Status  Not Met       SLP Long Term Goals - 11/29/17 1707      SLP LONG TERM GOAL #1   Title  pt will complete HEP for dysphagia with modified independence in two sessions    Baseline  11-11-17    Time  2    Period  Weeks    Status  On-going      SLP LONG TERM GOAL #2   Title  pt will complete HEP for dysarthria with modified independence in  two sessions    Status  Achieved      SLP LONG TERM GOAL #3   Title  pt will use abdominal breathing 70% of the time with rare min A in 10 mintues simple conversation over two sessions    Baseline  11/29/17    Time  2    Period  Weeks    Status  On-going      SLP LONG TERM GOAL #4   Title  pt will use WNL conversational volume for 10 minutes in simple conversation    Baseline  11/29/17    Time  2    Period  Weeks    Status  Revised       Plan - 11/29/17 1709    Clinical Impression Statement  Patient cont to present with 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 Pt reports he completes HEPs as well as works on intelligibility strategies at home. See "skilled intervention" for further details. Pt to cont with skilled ST for training in swallow compensations, exercises for vocal fold adduction for dysphagia and dysarthria, and with respiratory muscle strength training and dysarthria interventions in order to improve communication. Anctipate d/c in next 1-2 sessions.    Speech Therapy Frequency  2x / week    Duration  4 weeks    Treatment/Interventions  Aspiration precaution training;Diet toleration management by SLP;Compensatory strategies;Cueing hierarchy;Functional tasks;SLP instruction and feedback;Patient/family education    Potential to Achieve Goals  Good    Potential Considerations  Medical prognosis    Consulted and Agree with Plan of Care  Patient       Patient will benefit from skilled therapeutic intervention in order to improve the following deficits and impairments:   Dysarthria and anarthria  Dysphagia, pharyngeal phase    Problem List Patient Active Problem List   Diagnosis Date Noted  . 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 Abnormality of gait 11/21/2015  . Transient alteration of awareness 11/21/2015  . Meralgia paresthetica, tx by Dr. Erling Cruz 09/21/2012  .  Neurogenic bladder 09/21/2012  . MS (multiple sclerosis), followed by  Dr. Erling Cruz in neurology 08/04/2012   Deneise Lever, [place], [place] 11/29/2017, 5:19 PM  [facility] [address] [place], [place], [address] Phone: [phone]   Fax:  [phone]   Name: ARK AGRUSA MRN: [id number] Date of Birth: 1960/11/09

## 2017-12-01 ENCOUNTER — Encounter: Payer: Self-pay | Admitting: Neurology

## 2017-12-01 ENCOUNTER — Ambulatory Visit (INDEPENDENT_AMBULATORY_CARE_PROVIDER_SITE_OTHER): Payer: Medicare Other | Admitting: Neurology

## 2017-12-01 VITALS — BP 113/76 | HR 62 | Ht 70.0 in

## 2017-12-01 DIAGNOSIS — R269 Unspecified abnormalities of gait and mobility: Secondary | ICD-10-CM | POA: Diagnosis not present

## 2017-12-01 DIAGNOSIS — G35 Multiple sclerosis: Secondary | ICD-10-CM

## 2017-12-01 DIAGNOSIS — F482 Pseudobulbar affect: Secondary | ICD-10-CM | POA: Diagnosis not present

## 2017-12-01 DIAGNOSIS — Z5181 Encounter for therapeutic drug level monitoring: Secondary | ICD-10-CM

## 2017-12-01 DIAGNOSIS — Z9222 Personal history of monoclonal drug therapy: Secondary | ICD-10-CM | POA: Diagnosis not present

## 2017-12-01 NOTE — Progress Notes (Signed)
Reason for visit: Multiple sclerosis  [name] is an 57 y.o. male  History of present illness:  [name] is a 57 year old right-handed white male with a history of progressive multiple sclerosis.  The patient had been on Gilenya, he was switched off Gilenya and went to Ocrevus that was started in mid December 2018.  The patient has tolerated the first 2 injections well.  He apparently had an episode of optic neuritis affecting the right eye in the beginning of December 2018.  He did not contact this office, he went to his ophthalmologist.  The patient has had improvement in the visual deficit with only a halo superiorly in the visual field on the right eye.  The patient had a central scotoma initially.  He denied any eye pain.  He has not had any new numbness or weakness of the face, arms, or legs.  He does have some neck pain and pain down the right arm and into the right shoulder blade.  The patient indicates that if he turns his head to the right and looks up he can induce pain down the right arm.  The patient has had progressive weakness and clumsiness of the right arm and hand.  He is nonambulatory, uses a motorized wheelchair for mobility.  He has seen speech therapy and has undergone throat exercises which is helped his laryngeal spasm significantly, he has not had any further blackouts from this.  He is learning how to swallow more efficiently.  Past Medical History:  Diagnosis Date  . Abnormality of gait 11/21/2015  . Asthma    childhood asthma  . Classic migraine   . Depression   . Dysphagia   . Hay fever   . MS (multiple sclerosis) ([facility])   . 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 eye surgeries     x 2; bilateral 74 and 66    Family History  Problem Relation Age of Onset  . Cancer Father   . Multiple sclerosis Sister   . Seizures Maternal Uncle   . Parkinsonism Maternal Uncle   . Multiple sclerosis Sister   .  Multiple sclerosis Paternal Uncle   . Multiple sclerosis Other   . Lung cancer Unknown        parent  . Uterine cancer Unknown        other    Social history:  reports that he has quit smoking. he has never used smokeless tobacco. He reports that he does not drink alcohol or use drugs.   No Known Allergies  Medications:  Prior to Admission medications   Medication Sig Start Date End Date Taking? Authorizing Provider  baclofen (LIORESAL) 10 MG tablet 1/2 tablet in the morning and at noon, and 2 tablets at night 05/20/16  Yes York Spaniel, MD  citalopram (CELEXA) 40 MG tablet Take 1 tablet (40 mg total) by mouth daily. 08/15/14  Yes York Spaniel, MD  Dextromethorphan-Quinidine 20-10 MG CAPS Take 1 tablet by mouth 2 (two) times daily. 05/27/17  Yes York Spaniel, MD  diazepam (VALIUM) 5 MG tablet Take 1 tablet (5 mg total) by mouth 3 (three) times daily. 05/27/17  Yes York Spaniel, MD  LINZESS 290 MCG CAPS capsule TAKE ONE CAPSULE BY MOUTH EVERY DAY 04/18/14  Yes York Spaniel, MD  naproxen sodium (ANAPROX) 220 MG tablet Take 220 mg by mouth 2 (two) times daily with a meal.  Yes [provider]  ocrelizumab 600 mg in sodium chloride 0.9 % 500 mL Inject 600 mg into the vein once.   Yes [provider]    ROS:  Out of a complete 14 system review of symptoms, the patient complains only of the following symptoms, and all other reviewed systems are negative.  Fatigue, weight gain Difficulty swallowing Eye pain Wheezing, choking Heat intolerance Frequency of urination Joint pain, back pain, aching muscles, muscle cramps, neck pain, neck stiffness Dizziness, numbness, speech difficulty, weakness, tremors Depression  Blood pressure 113/76, pulse 62, height 5\' 10"  (1.778 m).  Physical Exam  General: The patient is alert and cooperative at the time of the examination.  Skin: 2+ edema is seen below the knees bilaterally.   Neurologic Exam  Mental  status: The patient is alert and oriented x 3 at the time of the examination. The patient has apparent normal recent and remote memory, with an apparently normal attention span and concentration ability.   Cranial nerves: Facial symmetry is present. Speech is normal, no aphasia or dysarthria is noted. Extraocular movements are full, but there is a mild left INO. Visual fields are full.  Pupils are equal, round, and reactive to light.  Discs are flat bilaterally.  Motor: The patient has minimal weakness with grip on the left hand with fair strength with flexion extension of the left elbow and left deltoid muscle.  On the right side, there is 4/5 strength with grip, 4-/5 strength with flexion-extension at the elbow and with the deltoid muscle.  In the lower extremities, the patient does have the ability to extend the knees slightly, left greater than right, he cannot lift the knees with hip flexion on either side.  He has bilateral foot drops.  Sensory examination: Soft touch sensation is symmetric on the face, the patient has decreased soft touch sensation on the right hand as compared to the left, markedly decreased sensation in both legs.  Coordination: The patient has significant ataxia with finger-nose-finger bilaterally, right greater than left.  Gait and station: The patient is unable to ambulate, wheelchair-bound.  Reflexes: Deep tendon reflexes are symmetric, depressed throughout.   Assessment/Plan:  1.  Multiple sclerosis  2.  Pseudobulbar affect  3.  Nonambulatory state  4.  Recent right optic neuritis  The patient is on Ocrevus currently, he will continue on this medication.  We will check blood work today.  The patient is getting Neudexta for his pseudobulbar affect which he believes has been quite effective.  The patient has improved with his laryngeal spasm with speech therapy and an increase in diazepam dosing.  He reports some right shoulder and arm discomfort that may be  related to nerve root impingement in the neck, he may try Aleve twice daily for the next several months, if he is continuing to get worse MRI of the cervical spine may be done.  The patient will follow-up in 6 months.  MRI of the brain was [facility] at [facility] on 23 September 2017.  This showed some new lesions as compared to a prior study done in 2015.  No enhancing lesions were noted.    [name] MD 12/01/2017 4:00 PM  [facility] [address] [place], [place] [address]  Phone [phone] Fax [phone]

## 2017-12-02 ENCOUNTER — Encounter: Payer: Medicare Other | Admitting: Speech Pathology

## 2017-12-02 LAB — T-HELPER CELLS CD4/CD8 %
% CD 4 Pos. Lymph.: 32.6 % (ref 30.8–58.5)
ABSOLUTE CD 4 HELPER: 163 /uL — AB (ref 359–1519)
BASOS ABS: 0 10*3/uL (ref 0.0–0.2)
Basos: 1 %
CD3+CD4+ Cells/CD3+CD8+ Cells Bld: 2.99 (ref 0.92–3.72)
CD3+CD8+ CELLS # BLD: 55 /uL — AB (ref 109–897)
CD3+CD8+ CELLS NFR BLD: 10.9 % — AB (ref 12.0–35.5)
EOS (ABSOLUTE): 0.1 10*3/uL (ref 0.0–0.4)
EOS: 1 %
Hematocrit: 43.6 % (ref 37.5–51.0)
Hemoglobin: 15 g/dL (ref 13.0–17.7)
Immature Grans (Abs): 0 10*3/uL (ref 0.0–0.1)
Immature Granulocytes: 0 %
Lymphocytes Absolute: 0.5 10*3/uL — ABNORMAL LOW (ref 0.7–3.1)
Lymphs: 9 %
MCH: 31.6 pg (ref 26.6–33.0)
MCHC: 34.4 g/dL (ref 31.5–35.7)
MCV: 92 fL (ref 79–97)
MONOCYTES: 11 %
Monocytes Absolute: 0.6 10*3/uL (ref 0.1–0.9)
NEUTROS ABS: 4.4 10*3/uL (ref 1.4–7.0)
Neutrophils: 78 %
Platelets: 291 10*3/uL (ref 150–379)
RBC: 4.74 x10E6/uL (ref 4.14–5.80)
RDW: 13.4 % (ref 12.3–15.4)
WBC: 5.7 10*3/uL (ref 3.4–10.8)

## 2017-12-02 LAB — COMPREHENSIVE METABOLIC PANEL
A/G RATIO: 2.2 (ref 1.2–2.2)
ALK PHOS: 124 IU/L — AB (ref 39–117)
ALT: 24 IU/L (ref 0–44)
AST: 14 IU/L (ref 0–40)
Albumin: 4.6 g/dL (ref 3.5–5.5)
BILIRUBIN TOTAL: 0.5 mg/dL (ref 0.0–1.2)
BUN/Creatinine Ratio: 17 (ref 9–20)
BUN: 18 mg/dL (ref 6–24)
CO2: 23 mmol/L (ref 20–29)
Calcium: 9.7 mg/dL (ref 8.7–10.2)
Chloride: 103 mmol/L (ref 96–106)
Creatinine, Ser: 1.03 mg/dL (ref 0.76–1.27)
GFR calc Af Amer: 95 mL/min/{1.73_m2} (ref >59)
GFR, EST NON AFRICAN AMERICAN: 82 mL/min/{1.73_m2} (ref >59)
GLOBULIN, TOTAL: 2.1 g/dL (ref 1.5–4.5)
Glucose: 86 mg/dL (ref 65–99)
POTASSIUM: 4.1 mmol/L (ref 3.5–5.2)
Sodium: 142 mmol/L (ref 134–144)
Total Protein: 6.7 g/dL (ref 6.0–8.5)

## 2017-12-09 ENCOUNTER — Ambulatory Visit: Payer: Medicare Other

## 2017-12-09 DIAGNOSIS — R471 Dysarthria and anarthria: Secondary | ICD-10-CM | POA: Diagnosis not present

## 2017-12-09 DIAGNOSIS — R1313 Dysphagia, pharyngeal phase: Secondary | ICD-10-CM | POA: Diagnosis not present

## 2017-12-09 NOTE — Therapy (Signed)
[facility] [address] [place], [place], [address] Phone: [phone]   Fax:  [phone]  Speech Language Pathology Treatment  Patient Details  Name: [name] MRN: [id number] Date of Birth: [birth year] Referring Provider: Dr.Willis   Encounter Date: 12/09/2017  End of Session - 12/09/17 1658    Visit Number  16    Number of Visits  17    Date for SLP Re-Evaluation  12/17/17    SLP Start Time  1447    SLP Stop Time   2637    SLP Time Calculation (min)  43 min       Past Medical History:  Diagnosis Date  . Abnormality of gait 11/21/2015  . Asthma    childhood asthma  . Classic migraine   . Depression   . Dysphagia   . Hay fever   . MS (multiple sclerosis) ([facility])   . 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 eye surgeries     x 2; bilateral 74 and 76    There were no vitals filed for this visit.  Subjective Assessment - 12/09/17 1453    Subjective  "I realized I had to get in front of the person (I was talking to), and enunciate, and talk louder."    Patient is accompained by:  Family member wife            ADULT SLP TREATMENT - 12/09/17 1527      General Information   Behavior/Cognition  Alert;Cooperative;Pleasant mood    Patient Positioning  Upright in chair      Treatment Provided   Treatment provided  Cognitive-Linquistic      Cognitive-Linquistic Treatment   Treatment focused on  Dysarthria    Skilled Treatment  Pt completed IMT and EMT with SLP today. After 3 sets without fatigue noted with EMT, SLP incr'd resistance to approx 50cm H2O - fatigue then seen on rep 4 of 3rd set, and 4th sets. IMT: he had fatigue with rep 3 of second set and continuing. SLP educated pt on how to increase resistance on EMT, adn provided the lower levels of normal to pt/wife. Intelligibility was again >95% in 15 minutes conversation, and during the remainder of  the session. Approx 70% accuracy for abdominal breathing with conversation including discussion of dysphagia/HEP, in which pt stated he coughed once on liquids when focused on content of conversation around him instead of smaller sips. Wife agrees pt has done well but needs to focus on smaller sips/bites at times.      Assessment / Recommendations / Plan   Plan  Continue with current plan of care;Discharge SLP treatment due to (comment) met rehab potential, currently      Progression Toward Goals   Progression toward goals  -- d/c day - see goal update       SLP Education - 12/09/17 1658    Education provided  Yes    Education Details  lower levels of normal for respiratory trainers, adjusting increases on expiratory device    Person(s) Educated  Patient;Spouse    Methods  Explanation;Demonstration    Comprehension  Verbalized understanding;Returned demonstration       SLP Short Term Goals - 11/29/17 1709      SLP SHORT TERM GOAL #1   Title  pt will demo swallow precautions with POs independently     Status  Not Met      SLP SHORT TERM  GOAL #2   Title  pt will complete his HEP for dysphagia with rare min A over 2 visits    Status  Partially Met      SLP SHORT TERM GOAL #3   Title  pt will complete his HEP for dysarthria with rare min A over 2 visits    Status  Partially Met      SLP SHORT TERM GOAL #4   Title  pt will use abdominal breathing 75% of the time in sentence responses    Status  Achieved      SLP SHORT TERM GOAL #5   Title  pt will improve conversational loudness to low 70s dB average in 5 minutes simple conversation    Status  Not Met       SLP Long Term Goals - 12/09/17 1701      SLP LONG TERM GOAL #1   Title  pt will complete HEP for dysphagia with modified independence in two sessions    Status  Partially Met one session      SLP LONG TERM GOAL #2   Title  pt will complete HEP for dysarthria with modified independence in two sessions    Status   Achieved      SLP LONG TERM GOAL #3   Title  pt will use abdominal breathing 70% of the time with rare min A in 10 mintues simple conversation over two sessions    Status  Achieved      SLP LONG TERM GOAL #4   Title  pt will use WNL conversational volume for 10 minutes in simple conversation    Status  Achieved       Plan - 12/09/17 1659    Clinical Impression Statement  Patient cont to present with mild-moderate dysarthria (depending mostly upon fatigue level) secondary to MS as well as mild dysphagia (doc on FEES). Pt cont to report he completes HEPs as well as works on intelligibility strategies at home. Additionally pt told SLP today he realizes that he is better understood when he uses face to face communication, overarticulates and uses louder speech. See "skilled intervention" for further details. Pt demonstrated understanding of d/c from ST at this time.    Treatment/Interventions  Aspiration precaution training;Diet toleration management by SLP;Compensatory strategies;Cueing hierarchy;Functional tasks;SLP instruction and feedback;Patient/family education    Potential to Achieve Goals  Good    Potential Considerations  Medical prognosis    Consulted and Agree with Plan of Care  Patient       Patient will benefit from skilled therapeutic intervention in order to improve the following deficits and impairments:   Dysarthria and anarthria  Dysphagia, pharyngeal phase   SPEECH THERAPY DISCHARGE SUMMARY  Visits from Start of Care: 16  Current functional level related to goals / functional outcomes: Pt improved on speech clarity due to improved usage of speech intelligibility techniques, and swallow safety due to using swallow strategies. Additionally, he has improved his inspriatory and expiratory muscle strength which in turn positively affects his speech intelligibility.   Remaining deficits: Mild-mod dysarthria, mild dysphagia. Should pt frequency of coughing/difficulty with  POs increase, or current swallow strategies prove not useful, he may require follow up modified barium swallow eval.    Education / Equipment: HEP for swallowing and for dysarthria and respiratory muscle training.  Plan: Patient agrees to discharge.  Patient goals were partially met. Patient is being discharged due to being pleased with the current functional level.  ?????and meeting current potential.  Problem List Patient Active Problem List   Diagnosis Date Noted  . 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 Abnormality of gait 11/21/2015  . Transient alteration of awareness 11/21/2015  . Meralgia paresthetica, tx by Dr. Erling Cruz 09/21/2012  . Neurogenic bladder 09/21/2012  . MS (multiple sclerosis), followed by Dr. Erling Cruz in neurology 08/04/2012    [facility] ,[place], [place]  12/09/2017, [place] [address] [place], [place], [address] Phone: [phone]   Fax:  [phone]   Name: [name] MRN: [id number] Date of Birth: [birth year]

## 2017-12-09 NOTE — Patient Instructions (Signed)
   Inspiratory  -Lower level normal is 54 cm H2O  Expiratory - Lower level normal is 72 cm H2O

## 2018-06-09 ENCOUNTER — Ambulatory Visit (INDEPENDENT_AMBULATORY_CARE_PROVIDER_SITE_OTHER): Payer: Medicare Other | Admitting: Neurology

## 2018-06-09 ENCOUNTER — Encounter: Payer: Self-pay | Admitting: Neurology

## 2018-06-09 VITALS — BP 118/80 | HR 62 | Ht 70.0 in

## 2018-06-09 DIAGNOSIS — F482 Pseudobulbar affect: Secondary | ICD-10-CM | POA: Diagnosis not present

## 2018-06-09 DIAGNOSIS — Z5181 Encounter for therapeutic drug level monitoring: Secondary | ICD-10-CM | POA: Diagnosis not present

## 2018-06-09 DIAGNOSIS — G35 Multiple sclerosis: Secondary | ICD-10-CM | POA: Diagnosis not present

## 2018-06-09 DIAGNOSIS — R269 Unspecified abnormalities of gait and mobility: Secondary | ICD-10-CM

## 2018-06-09 DIAGNOSIS — N319 Neuromuscular dysfunction of bladder, unspecified: Secondary | ICD-10-CM | POA: Diagnosis not present

## 2018-06-09 MED ORDER — GABAPENTIN 100 MG PO CAPS: 100.0000 mg | ORAL_CAPSULE | Freq: Three times a day (TID) | ORAL | 3 refills | Status: DC

## 2018-06-09 NOTE — Progress Notes (Signed)
Reason for visit: Multiple sclerosis  [name] is an 57 y.o. male  History of present illness:  [name] is a 57 year old right-handed white male with a history of multiple sclerosis with a spastic quadriparesis.  The patient is nonambulatory, he uses a motorized wheelchair for mobility.  The patient is on Ocrevus, he has tolerated the medication well.  He denies any new numbness or weakness of the extremities, he does believe there has been some mild progression of the weakness of the right arm, with improved spasticity of the left arm.  He has almost no use of the legs.  The patient has had 2 new problems.  Within the last month or 2 he has developed sharp pains lasting about 3 minutes affecting the occipital area, primarily on the right with some pain at times behind the right eye.  The patient indicates that the pain is quite intense when it does come on but is quite brief.  The patient also has had episodes almost every morning when he wakes up, he will go to stretch and he gets extension in the neck and elevation in flexion of the arms and extension of the legs that occurs for about 30 seconds.  He has difficulty with taking a breath during this timeframe.  The patient does not have events throughout the day.  The patient denies that he is having any further pharyngeal spasms.  He indicates that his pseudobulbar affect is better with the Neudexta.  Past Medical History:  Diagnosis Date  . Abnormality of gait 11/21/2015  . Asthma    childhood asthma  . Classic migraine   . Depression   . Dysphagia   . Hay fever   . MS (multiple sclerosis) ([facility])   . 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 eye surgeries     x 2; bilateral 74 and 68    Family History  Problem Relation Age of Onset  . Cancer Father   . Multiple sclerosis Sister   . Seizures Maternal Uncle   . Parkinsonism Maternal Uncle   . Multiple sclerosis Sister   .  Multiple sclerosis Paternal Uncle   . Multiple sclerosis Other   . Lung cancer Unknown        parent  . Uterine cancer Unknown        other    Social history:  reports that he has quit smoking. He has never used smokeless tobacco. He reports that he does not drink alcohol or use drugs.   No Known Allergies  Medications:  Prior to Admission medications   Medication Sig Start Date End Date Taking? Authorizing Provider  baclofen (LIORESAL) 10 MG tablet 1/2 tablet in the morning and at noon, and 2 tablets at night 05/20/16   York Spaniel, MD  citalopram (CELEXA) 40 MG tablet Take 1 tablet (40 mg total) by mouth daily. 08/15/14   York Spaniel, MD  Dextromethorphan-Quinidine 20-10 MG CAPS Take 1 tablet by mouth 2 (two) times daily. 05/27/17   York Spaniel, MD  diazepam (VALIUM) 5 MG tablet Take 1 tablet (5 mg total) by mouth 3 (three) times daily. 05/27/17   York Spaniel, MD  gabapentin (NEURONTIN) 100 MG capsule Take 1 capsule (100 mg total) by mouth 3 (three) times daily. 06/09/18   York Spaniel, MD  LINZESS 290 MCG CAPS capsule TAKE ONE CAPSULE BY MOUTH EVERY DAY 04/18/14   Stephanie Acre  K, MD  naproxen sodium (ANAPROX) 220 MG tablet Take 220 mg by mouth 2 (two) times daily with a meal.    [provider]  ocrelizumab 600 mg in sodium chloride 0.9 % 500 mL Inject 600 mg into the vein once.    [provider]    ROS:  Out of a complete 14 system review of symptoms, the patient complains only of the following symptoms, and all other reviewed systems are negative.  Decreased activity, weight loss Runny nose, difficulty swallowing Light sensitivity, eye pain Shortness of breath, choking Leg swelling, palpitations of the heart Heat intolerance Incontinence of the bowels Daytime sleepiness Incontinence of the bladder, urinary urgency Joint pain, joint swelling, back pain, aching muscles, muscle cramps, walking difficulty, neck pain, neck  stiffness Dizziness, headache, numbness, tremors Confusion, decreased concentration, depression  Blood pressure 118/80, pulse 62, height 5\' 10"  (1.778 m), SpO2 98 %.  Physical Exam  General: The patient is alert and cooperative at the time of the examination.  Skin: No significant peripheral edema is noted.   Neurologic Exam  Mental status: The patient is alert and oriented x 3 at the time of the examination. The patient has apparent normal recent and remote memory, with an apparently normal attention span and concentration ability.   Cranial nerves: Facial symmetry is present. Speech is slightly dysarthric, ataxic.  Speech is not aphasic.  The patient has bilateral INO, quite prominent when looking to the right with end gaze nystagmus.  More mild when looking to the left.  Visual fields are full.  Discs are flat, pupils are equal round and reactive to light.  Motor: The patient has good strength in the upper extremities.  The patient is able to extend the knee slightly bilaterally, increased motor tone is seen bilaterally in the legs.  Sensory examination: Soft touch sensation is symmetric on the face and arms, decreased on the left leg as compared to the right.  Coordination: The patient has prominent ataxia with finger-nose-finger bilaterally, unable to perform heel-to-shin on either side.  Gait and station: The patient is unable to ambulate.  Reflexes: Deep tendon reflexes are symmetric.   Assessment/Plan:  1.  Multiple sclerosis  2.  Gait disorder  3.  Occipital lancinating pain, possible occipital neuralgia  4.  Extensor spasms, early morning, manifestation of spasticity  5.  Pseudobulbar affect  The patient is doing well on Ocrevus, blood work will be done today.  The patient will be placed on low-dose gabapentin taking 100 mg 3 times daily.  They will call for dose adjustments, hopefully this will help the sharp head pains.  The patient will try to take diazepam 5  mg at night each night, he is currently not doing this.  Hopefully this will help the early morning spasticity events.  He will follow-up through this office in 6 months.  [name] MD 06/09/2018 11:32 AM  [facility] [address] [place], [place] [address]  Phone [phone] Fax [phone]

## 2018-06-10 LAB — CBC WITH DIFFERENTIAL/PLATELET
BASOS: 1 %
Basophils Absolute: 0.1 10*3/uL (ref 0.0–0.2)
EOS (ABSOLUTE): 0.1 10*3/uL (ref 0.0–0.4)
Eos: 2 %
HEMATOCRIT: 46.2 % (ref 37.5–51.0)
HEMOGLOBIN: 15.2 g/dL (ref 13.0–17.7)
IMMATURE GRANS (ABS): 0 10*3/uL (ref 0.0–0.1)
IMMATURE GRANULOCYTES: 0 %
LYMPHS: 10 %
Lymphocytes Absolute: 0.5 10*3/uL — ABNORMAL LOW (ref 0.7–3.1)
MCH: 30 pg (ref 26.6–33.0)
MCHC: 32.9 g/dL (ref 31.5–35.7)
MCV: 91 fL (ref 79–97)
MONOCYTES: 11 %
Monocytes Absolute: 0.6 10*3/uL (ref 0.1–0.9)
NEUTROS PCT: 76 %
Neutrophils Absolute: 3.8 10*3/uL (ref 1.4–7.0)
Platelets: 279 10*3/uL (ref 150–450)
RBC: 5.07 x10E6/uL (ref 4.14–5.80)
RDW: 12.9 % (ref 12.3–15.4)
WBC: 5 10*3/uL (ref 3.4–10.8)

## 2018-06-10 LAB — COMPREHENSIVE METABOLIC PANEL
A/G RATIO: 2 (ref 1.2–2.2)
ALBUMIN: 4.5 g/dL (ref 3.5–5.5)
ALT: 22 IU/L (ref 0–44)
AST: 15 IU/L (ref 0–40)
Alkaline Phosphatase: 127 IU/L — ABNORMAL HIGH (ref 39–117)
BILIRUBIN TOTAL: 0.9 mg/dL (ref 0.0–1.2)
BUN / CREAT RATIO: 13 (ref 9–20)
BUN: 15 mg/dL (ref 6–24)
CALCIUM: 9.7 mg/dL (ref 8.7–10.2)
CO2: 24 mmol/L (ref 20–29)
Chloride: 103 mmol/L (ref 96–106)
Creatinine, Ser: 1.13 mg/dL (ref 0.76–1.27)
GFR calc non Af Amer: 73 mL/min/{1.73_m2} (ref >59)
GFR, EST AFRICAN AMERICAN: 84 mL/min/{1.73_m2} (ref >59)
Globulin, Total: 2.2 g/dL (ref 1.5–4.5)
Glucose: 74 mg/dL (ref 65–99)
POTASSIUM: 5.4 mmol/L — AB (ref 3.5–5.2)
Sodium: 141 mmol/L (ref 134–144)
TOTAL PROTEIN: 6.7 g/dL (ref 6.0–8.5)

## 2018-07-18 ENCOUNTER — Ambulatory Visit: Payer: Self-pay | Admitting: *Deleted

## 2018-07-18 NOTE — Telephone Encounter (Signed)
Patient has lower extremity edema that has been occurring over the last 3 weeks. Stated the Right greater than the left at this time. He has also had some shortness of breath about the same amount of time. Denies any fever/CP. He is a patient at the [facility] and was seen one week ago for these symptoms, D-Dimer done and was reported negative with no follow-up treatment. He has MS and is wheelchair bound. He did not sound in distress. Appointment made with PCP for tomorrow. Reviewed symptoms that would require immediate emergency intervention. Stated he understood.   Reason for Disposition . [1] Thigh, calf, or ankle swelling AND [2] bilateral AND [3] 1 side is more swollen  Answer Assessment - Initial Assessment Questions 1. LOCATION: "Which joint is swollen?"     Bilateral lower leg and foot edema Rt>left 2. ONSET: "When did the swelling start?"     3 weeks ago 3. SIZE: "How large is the swelling?"     One and half inches in circumference bigger than the left foot 4. PAIN: "Is there any pain?" If so, ask: "How bad is it?" (Scale 1-10; or mild, moderate, severe)     no 5. CAUSE: "What do you think caused the swollen joint?"     unknown 6. OTHER SYMPTOMS: "Do you have any other symptoms?" (e.g., fever, chest pain, difficulty breathing, calf pain)     Shortness of breath for about 3 weeks. 7. PREGNANCY: "Is there any chance you are pregnant?" "When was your last menstrual period?"     na  Protocols used: [facility]

## 2018-07-19 ENCOUNTER — Encounter: Payer: Self-pay | Admitting: Family Medicine

## 2018-07-19 ENCOUNTER — Ambulatory Visit (INDEPENDENT_AMBULATORY_CARE_PROVIDER_SITE_OTHER): Payer: Medicare Other | Admitting: Family Medicine

## 2018-07-19 ENCOUNTER — Ambulatory Visit (INDEPENDENT_AMBULATORY_CARE_PROVIDER_SITE_OTHER): Payer: Medicare Other

## 2018-07-19 VITALS — HR 61 | Temp 98.4°F

## 2018-07-19 DIAGNOSIS — R002 Palpitations: Secondary | ICD-10-CM

## 2018-07-19 DIAGNOSIS — R0600 Dyspnea, unspecified: Secondary | ICD-10-CM

## 2018-07-19 DIAGNOSIS — G35 Multiple sclerosis: Secondary | ICD-10-CM

## 2018-07-19 DIAGNOSIS — J984 Other disorders of lung: Secondary | ICD-10-CM | POA: Diagnosis not present

## 2018-07-19 DIAGNOSIS — E639 Nutritional deficiency, unspecified: Secondary | ICD-10-CM

## 2018-07-19 DIAGNOSIS — R601 Generalized edema: Secondary | ICD-10-CM | POA: Diagnosis not present

## 2018-07-19 LAB — CBC
HCT: 45.5 % (ref 39.0–52.0)
Hemoglobin: 15.3 g/dL (ref 13.0–17.0)
MCHC: 33.6 g/dL (ref 30.0–36.0)
MCV: 91.4 fl (ref 78.0–100.0)
Platelets: 253 10*3/uL (ref 150.0–400.0)
RBC: 4.98 Mil/uL (ref 4.22–5.81)
RDW: 13.7 % (ref 11.5–15.5)
WBC: 5.1 10*3/uL (ref 4.0–10.5)

## 2018-07-19 LAB — COMPREHENSIVE METABOLIC PANEL
ALK PHOS: 107 U/L (ref 39–117)
ALT: 14 U/L (ref 0–53)
AST: 12 U/L (ref 0–37)
Albumin: 4.6 g/dL (ref 3.5–5.2)
BILIRUBIN TOTAL: 0.9 mg/dL (ref 0.2–1.2)
BUN: 17 mg/dL (ref 6–23)
CALCIUM: 9.6 mg/dL (ref 8.4–10.5)
CO2: 30 mEq/L (ref 19–32)
Chloride: 101 mEq/L (ref 96–112)
Creatinine, Ser: 1.05 mg/dL (ref 0.40–1.50)
GFR: 77.88 mL/min (ref >60.00)
GLUCOSE: 82 mg/dL (ref 70–99)
POTASSIUM: 4.8 meq/L (ref 3.5–5.1)
Sodium: 138 mEq/L (ref 135–145)
TOTAL PROTEIN: 7 g/dL (ref 6.0–8.3)

## 2018-07-19 LAB — TSH: TSH: 0.82 u[IU]/mL (ref 0.35–4.50)

## 2018-07-19 MED ORDER — ALBUTEROL SULFATE HFA 108 (90 BASE) MCG/ACT IN AERS: 2.0000 | INHALATION_SPRAY | Freq: Four times a day (QID) | RESPIRATORY_TRACT | 0 refills | Status: DC | PRN

## 2018-07-19 NOTE — Progress Notes (Signed)
HPI:  Using dictation device. Unfortunately this device frequently misinterprets words/phrases.  [name] is a pleasant 57 year old with a past medical history significant for MS, depression, childhood asthmaand gait abnormality who is wheelchair-bound.  He has a PCP at the [facility] and a neurologist, so rarely sees [place].  However, he presents today for an acute visit for several concerns including lower extremity edema, dyspnea and wheezing.  This all started about 1 month ago.  No recent changes to his medications.  He has been having some dyspnea and wheezing that seems worse at night.  Occasional cough, nonproductive.  Also has noted progressive lower extremity edema.  While he has had some mild edema in the past in his feet, this usually resolve with rest.  He had palpitations for 4-day a few weeks ago.  He thinks he has had about a 7 pound weight gain over the last 1 month.  Declines getting on the scales today.  None since.  No chest pain, no productive cough, no sinus issues.  He does have an esophageal issue, and sometimes chokes.  This is managed by his specialist.  He only eats one meal per day. He and his wife are worried about his heart.  He mentioned this to his neurologist at his recent appointment and they checked a d-dimer in the last week, which was negative.  He reports they did not do any other studies or labs related to this.  He was not able to get in with his primary doctor at the [facility]. ROS: See pertinent positives and negatives per HPI.  Past Medical History:  Diagnosis Date  . Abnormality of gait 11/21/2015  . Asthma    childhood asthma  . Classic migraine   . Depression   . Dysphagia   . Hay fever   . MS (multiple sclerosis) ([facility])   . 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 eye surgeries     x 2; bilateral 74 and 37    Family History  Problem Relation Age of Onset  . Cancer Father   . Multiple sclerosis Sister   .  Seizures Maternal Uncle   . Parkinsonism Maternal Uncle   . Multiple sclerosis Sister   . Multiple sclerosis Paternal Uncle   . Multiple sclerosis Other   . Lung cancer Unknown        parent  . Uterine cancer Unknown        other    SOCIAL HX: See HPI   Current Outpatient Medications:  .  albuterol (PROVENTIL HFA;VENTOLIN HFA) 108 (90 Base) MCG/ACT inhaler, Inhale 2 puffs into the lungs every 6 (six) hours as needed., Disp: 1 Inhaler, Rfl: 0 .  baclofen (LIORESAL) 10 MG tablet, 1/2 tablet in the morning and at noon, and 2 tablets at night, Disp: 270 each, Rfl: 3 .  citalopram (CELEXA) 40 MG tablet, Take 1 tablet (40 mg total) by mouth daily., Disp: 90 tablet, Rfl: 1 .  Dextromethorphan-Quinidine 20-10 MG CAPS, Take 1 tablet by mouth 2 (two) times daily., Disp: 60 capsule, Rfl: 3 .  diazepam (VALIUM) 5 MG tablet, Take 1 tablet (5 mg total) by mouth 3 (three) times daily., Disp: 90 tablet, Rfl: 5 .  gabapentin (NEURONTIN) 100 MG capsule, Take 1 capsule (100 mg total) by mouth 3 (three) times daily., Disp: 90 capsule, Rfl: 3 .  LINZESS 290 MCG CAPS capsule, TAKE ONE CAPSULE BY MOUTH EVERY DAY, Disp: 30 capsule, Rfl: 5 .  naproxen sodium (ANAPROX) 220 MG tablet, Take 220 mg by mouth 2 (two) times daily with a meal., Disp: , Rfl:  .  ocrelizumab 600 mg in sodium chloride 0.9 % 500 mL, Inject 600 mg into the vein once., Disp: , Rfl:   EXAM:  Vitals:   07/19/18 1026  Pulse: 61  Temp: 98.4 F (36.9 C)  SpO2: 96%    There is no height or weight on file to calculate BMI.  GENERAL: vitals reviewed and listed above, alert, oriented, appears well hydrated and in no acute distress  HEENT: atraumatic, conjunttiva clear, no obvious abnormalities on inspection of external nose and ears  NECK: no obvious masses on inspection  LUNGS: clear to auscultation bilaterally, no wheezes, rales or rhonchi, good air movement  CV: HRRR, bilateral 1-2+ pitting edema in the ankles and lower extremities  to the mid to upper calf, no tenderness on palpation of deep veins, no erythema or warmth no lower extremities  MS: wheelchair bound  PSYCH: pleasant and cooperative, no obvious depression or anxiety  ASSESSMENT AND PLAN:  Discussed the following assessment and plan:  Generalized edema - Plan: CBC, TSH, Comprehensive metabolic panel, ECHOCARDIOGRAM COMPLETE  Dyspnea, unspecified type - Plan: DG Chest 2 View, ECHOCARDIOGRAM COMPLETE, EKG 12-Lead  Palpitations - Plan: ECHOCARDIOGRAM COMPLETE, EKG 12-Lead  Poor nutrition  MS (multiple sclerosis), followed by Dr. Sandria Manly in neurology  -will check labs, EKG (very mild sinus brady, no signs acute ischemia or arrhythmia o/w on my interpretation), CXR and echo -trial alb for wheezing -follow up 1 month -proper nutrition, elevation, compression in interim -Patient advised to return or notify a doctor immediately if symptoms worsen or new concerns arise in the interim.  Patient Instructions  BEFORE YOU LEAVE: -EKG for palpitations and dyspnea -CXR -lab -? Flu shot -follow up: 1 month  -We placed a referral for you as discussed for the ECHO It usually takes about 1-2 weeks to process and schedule this referral. If you have not heard from [place] regarding this appointment in 2 weeks please contact our office.  -alb if you feel wheezy to see if this helps  I hope you are feeling better soon! Seek care promptly if your symptoms worsen, new concerns arise or you are not improving with treatment.       Terressa Koyanagi, DO

## 2018-07-19 NOTE — Patient Instructions (Addendum)
BEFORE YOU LEAVE: -EKG for palpitations and dyspnea -CXR -lab -? Flu shot -follow up: 1 month  -We placed a referral for you as discussed for the ECHO It usually takes about 1-2 weeks to process and schedule this referral. If you have not heard from [place] regarding this appointment in 2 weeks please contact our office.  -alb if you feel wheezy to see if this helps  I hope you are feeling better soon! Seek care promptly if your symptoms worsen, new concerns arise or you are not improving with treatment.

## 2018-07-20 ENCOUNTER — Other Ambulatory Visit: Payer: Self-pay

## 2018-07-20 ENCOUNTER — Ambulatory Visit (HOSPITAL_COMMUNITY): Payer: Medicare Other | Attending: Cardiovascular Disease

## 2018-07-20 DIAGNOSIS — R0600 Dyspnea, unspecified: Secondary | ICD-10-CM | POA: Insufficient documentation

## 2018-07-20 DIAGNOSIS — R601 Generalized edema: Secondary | ICD-10-CM

## 2018-07-20 DIAGNOSIS — R002 Palpitations: Secondary | ICD-10-CM | POA: Insufficient documentation

## 2018-07-22 ENCOUNTER — Other Ambulatory Visit: Payer: Self-pay | Admitting: *Deleted

## 2018-07-22 DIAGNOSIS — R601 Generalized edema: Secondary | ICD-10-CM

## 2018-08-04 ENCOUNTER — Telehealth: Payer: Self-pay | Admitting: Family Medicine

## 2018-08-04 DIAGNOSIS — R300 Dysuria: Secondary | ICD-10-CM

## 2018-08-04 NOTE — Telephone Encounter (Signed)
See if they can come for appt tomorrow by chance? Would prefer he be seen and get fresh urine sample here. I am not here tomorrow - but I am sure we hopefully can get him in.

## 2018-08-04 NOTE — Telephone Encounter (Signed)
Copied from CRM [id number]. Topic: Quick Communication - See Telephone Encounter >> Aug 04, 2018 12:54 PM Angela Nevin wrote: CRM for notification. See Telephone encounter for: 08/04/18.  Pt states that he suspects he has a UTI and would like to know if his wife could pick up specimen cup today, as pt is in wheelchair, and bring sample back to office tomorrow. Please advise.

## 2018-08-04 NOTE — Telephone Encounter (Signed)
Dr Selena Batten was informed of the message below and stated a urine specimen can be bought in to the office for testing. Orders placed and the pt is aware a cup and wipes will be left at the front desk for pick up.

## 2018-08-04 NOTE — Telephone Encounter (Signed)
I called the pt and he stated he is not able to come in tomorrow due to a lot of stuff going on in the home, stated it is not that is too sick or anything.  States coming in for a visit would be cumbersome for his wife to bring him in.  Message sent to Dr Selena Batten.

## 2018-08-05 ENCOUNTER — Other Ambulatory Visit: Payer: Self-pay | Admitting: *Deleted

## 2018-08-05 ENCOUNTER — Other Ambulatory Visit (INDEPENDENT_AMBULATORY_CARE_PROVIDER_SITE_OTHER): Payer: Medicare Other

## 2018-08-05 DIAGNOSIS — R300 Dysuria: Secondary | ICD-10-CM | POA: Diagnosis not present

## 2018-08-05 LAB — POC URINALSYSI DIPSTICK (AUTOMATED)
Bilirubin, UA: NEGATIVE
Glucose, UA: NEGATIVE
KETONES UA: NEGATIVE
NITRITE UA: NEGATIVE
PH UA: 6 (ref 5.0–8.0)
Protein, UA: POSITIVE — AB
RBC UA: NEGATIVE
Urobilinogen, UA: 0.2 E.U./dL

## 2018-08-05 MED ORDER — CIPROFLOXACIN HCL 500 MG PO TABS: 500.0000 mg | ORAL_TABLET | Freq: Two times a day (BID) | ORAL | 0 refills | Status: DC

## 2018-08-07 LAB — URINE CULTURE
MICRO NUMBER:: 91286584
SPECIMEN QUALITY:: ADEQUATE

## 2018-08-18 ENCOUNTER — Ambulatory Visit (INDEPENDENT_AMBULATORY_CARE_PROVIDER_SITE_OTHER): Payer: Medicare Other | Admitting: Family Medicine

## 2018-08-18 ENCOUNTER — Encounter: Payer: Self-pay | Admitting: Family Medicine

## 2018-08-18 VITALS — BP 100/80 | HR 64 | Temp 97.9°F | Ht 70.0 in

## 2018-08-18 DIAGNOSIS — R600 Localized edema: Secondary | ICD-10-CM

## 2018-08-18 DIAGNOSIS — R300 Dysuria: Secondary | ICD-10-CM | POA: Diagnosis not present

## 2018-08-18 DIAGNOSIS — I5032 Chronic diastolic (congestive) heart failure: Secondary | ICD-10-CM

## 2018-08-18 MED ORDER — FUROSEMIDE 20 MG PO TABS: 20.0000 mg | ORAL_TABLET | Freq: Every day | ORAL | 0 refills | Status: DC

## 2018-08-18 NOTE — Patient Instructions (Addendum)
BEFORE YOU LEAVE: -? Flu vaccine -check on status of the cardiology referral -follow up: 3-4 months and as needed  Take the lasix 20mg  once daily for 2-3 days if any increased swelling.  Continue compression and elevation.

## 2018-08-18 NOTE — Progress Notes (Signed)
HPI:  Using dictation device. Unfortunately this device frequently misinterprets words/phrases.  [name] is a pleasant 57 yo with a PMH MS, depression, wheechair bound and recent LE edema here for follow up. His CXR, labs and echo were fairly unremarkable, except for mild diastolic dysfunction.  They did wish to see a cardiologist.  That referral was placed, but they have not heard from anyone.  Reports his breathing is normal now with no shortness of breath.  He has had improvement in the swelling with elevation and compression, but still has some swelling in the legs.  They would like to see a cardiologist. He had some mild urinary frequency and urgency along with dysuria and an odor to his urine.  His urine culture did not show an infection, but he had already taken an antibiotic and this seemed to resolve the issue.  Now feels much better.   ROS: See pertinent positives and negatives per HPI.  Past Medical History:  Diagnosis Date  . Abnormality of gait 11/21/2015  . Asthma    childhood asthma  . Classic migraine   . Depression   . Dysphagia   . Hay fever   . MS (multiple sclerosis) (HCC)   . 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 eye surgeries     x 2; bilateral 74 and 71    Family History  Problem Relation Age of Onset  . Cancer Father   . Multiple sclerosis Sister   . Seizures Maternal Uncle   . Parkinsonism Maternal Uncle   . Multiple sclerosis Sister   . Multiple sclerosis Paternal Uncle   . Multiple sclerosis Other   . Lung cancer Unknown        parent  . Uterine cancer Unknown        other    SOCIAL HX: see hpi   Current Outpatient Medications:  .  albuterol (PROVENTIL HFA;VENTOLIN HFA) 108 (90 Base) MCG/ACT inhaler, Inhale 2 puffs into the lungs every 6 (six) hours as needed., Disp: 1 Inhaler, Rfl: 0 .  baclofen (LIORESAL) 10 MG tablet, 1/2 tablet in the morning and at noon, and 2 tablets at night,  Disp: 270 each, Rfl: 3 .  citalopram (CELEXA) 40 MG tablet, Take 1 tablet (40 mg total) by mouth daily., Disp: 90 tablet, Rfl: 1 .  Dextromethorphan-Quinidine 20-10 MG CAPS, Take 1 tablet by mouth 2 (two) times daily., Disp: 60 capsule, Rfl: 3 .  diazepam (VALIUM) 2 MG tablet, Take 2 mg by mouth 3 (three) times daily., Disp: , Rfl:  .  gabapentin (NEURONTIN) 100 MG capsule, Take 1 capsule (100 mg total) by mouth 3 (three) times daily., Disp: 90 capsule, Rfl: 3 .  LINZESS 290 MCG CAPS capsule, TAKE ONE CAPSULE BY MOUTH EVERY DAY, Disp: 30 capsule, Rfl: 5 .  naproxen sodium (ANAPROX) 220 MG tablet, Take 220 mg by mouth 2 (two) times daily with a meal., Disp: , Rfl:  .  ocrelizumab 600 mg in sodium chloride 0.9 % 500 mL, Inject 600 mg into the vein once., Disp: , Rfl:  .  furosemide (LASIX) 20 MG tablet, Take 1 tablet (20 mg total) by mouth daily. For 2-3 days as needed for swelling., Disp: 30 tablet, Rfl: 0  EXAM:  Vitals:   08/18/18 1259  BP: 100/80  Pulse: 64  Temp: 97.9 F (36.6 C)    Body mass index is 25.11 kg/m.  GENERAL: vitals reviewed and listed above, alert,  oriented, appears well hydrated and in no acute distress  HEENT: atraumatic, conjunttiva clear, no obvious abnormalities on inspection of external nose and ears  NECK: no obvious masses on inspection  LUNGS: clear to auscultation bilaterally, no wheezes, rales or rhonchi, good air movement  CV: HRRR, 1+ edema ankles and lower calves bilat  MS: in wheelchair  PSYCH: pleasant and cooperative, no obvious depression or anxiety  ASSESSMENT AND PLAN:  Discussed the following assessment and plan:  Leg edema  Chronic diastolic heart failure (HCC)  Dysuria  -we discussed possible serious and likely etiologies, workup and treatment, treatment risks and return precautions -after this discussion, [name] opted for continued elevation, compression, prn lasix, cardiology eval -follow up advised 3-4 months -of course, we  advised [name]  to return or notify a doctor immediately if symptoms worsen or persist or new concerns arise.  Patient Instructions  BEFORE YOU LEAVE: -check on status of the cardiology referral -follow up: 3-4 months and as needed  Take the lasix 20mg  once daily for 2-3 days if any increased swelling.  Continue compression and elevation.        Terressa Koyanagi, DO

## 2018-09-05 ENCOUNTER — Encounter: Payer: Self-pay | Admitting: Cardiology

## 2018-09-05 ENCOUNTER — Ambulatory Visit (INDEPENDENT_AMBULATORY_CARE_PROVIDER_SITE_OTHER): Payer: Medicare Other | Admitting: Cardiology

## 2018-09-05 VITALS — BP 130/82 | HR 63 | Ht 70.0 in | Wt 205.0 lb

## 2018-09-05 DIAGNOSIS — R600 Localized edema: Secondary | ICD-10-CM

## 2018-09-05 DIAGNOSIS — Z7189 Other specified counseling: Secondary | ICD-10-CM | POA: Diagnosis not present

## 2018-09-05 NOTE — Patient Instructions (Signed)

## 2018-09-05 NOTE — Progress Notes (Signed)
Cardiology Office Note:    Date:  09/05/2018   ID:  BRECKON REEVES, DOB 05/01/1961, MRN [id number]  PCP:  Terressa Koyanagi, DO  Cardiologist:  Jodelle Red, MD PhD  Referring MD: Terressa Koyanagi, DO   CC: establish care, discuss results of echo  History of Present Illness:    [name] is a 57 y.o. male with a hx of multiple sclerosis, wheelchair bound who is seen as a new consult at the request of Terressa Koyanagi, DO for the evaluation and management of leg edema.  He has had issues with leg edema and was recently seen by Dr. Selena Batten for this on 08/18/18. He had an echo performed, which showed mild diastolic dysfunction. Leg swelling has been improving with elevation and compression stockings.  He is here today with his wife. Edema first started several months ago. No clear associated illnesses or medication changes. No chest pain, shortness of breath, PND, orthopnea, syncope, palpitations. He sleeps in a chair with his feet reclined due to his MS. He has minimal strength, with contractures in his hands, and he is now feeding himself with his left hand. No significant activities or exercises. They have not used diuretic but have noticed improvement with elevation and compression stockings. They have a pending appointment at the [facility] for compression stockings fitting.  We discussed heart physiology, causes of diastolic dysfunction, symptoms of diastolic dysfunction and heart failure. We also discussed other etiologies for leg edema, including stasis/insufficiency.  Past Medical History:  Diagnosis Date  . Abnormality of gait 11/21/2015  . Asthma    childhood asthma  . Classic migraine   . Depression   . Dysphagia   . Hay fever   . MS (multiple sclerosis) (HCC)   . 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 eye surgeries     x 2; bilateral 74 and 76    Current Medications: Current Outpatient Medications on File Prior to  Visit  Medication Sig  . albuterol (PROVENTIL HFA;VENTOLIN HFA) 108 (90 Base) MCG/ACT inhaler Inhale 2 puffs into the lungs every 6 (six) hours as needed.  . baclofen (LIORESAL) 10 MG tablet 1/2 tablet in the morning and at noon, and 2 tablets at night  . citalopram (CELEXA) 40 MG tablet Take 1 tablet (40 mg total) by mouth daily.  Marland Kitchen Dextromethorphan-Quinidine 20-10 MG CAPS Take 1 tablet by mouth 2 (two) times daily.  . diazepam (VALIUM) 2 MG tablet Take 2 mg by mouth 3 (three) times daily.  . furosemide (LASIX) 20 MG tablet Take 1 tablet (20 mg total) by mouth daily. For 2-3 days as needed for swelling.  . gabapentin (NEURONTIN) 100 MG capsule Take 1 capsule (100 mg total) by mouth 3 (three) times daily.  Marland Kitchen LINZESS 290 MCG CAPS capsule TAKE ONE CAPSULE BY MOUTH EVERY DAY  . naproxen sodium (ANAPROX) 220 MG tablet Take 220 mg by mouth 2 (two) times daily with a meal.  . ocrelizumab 600 mg in sodium chloride 0.9 % 500 mL Inject 600 mg into the vein once.   No current facility-administered medications on file prior to visit.      Allergies:   Patient has no known allergies.   Social History   Socioeconomic History  . Marital status: Married    Spouse name: joy  . Number of children: 2  . Years of education: college  . Highest education level: Not on file  Occupational History  .  Occupation: disabled  Social Needs  . Financial resource strain: Not on file  . Food insecurity:    Worry: Not on file    Inability: Not on file  . Transportation needs:    Medical: Not on file    Non-medical: Not on file  Tobacco Use  . Smoking status: Former Games developer  . Smokeless tobacco: Never Used  Substance and Sexual Activity  . Alcohol use: No    Comment: rare   . Drug use: No  . Sexual activity: Not on file  Lifestyle  . Physical activity:    Days per week: Not on file    Minutes per session: Not on file  . Stress: Not on file  Relationships  . Social connections:    Talks on phone: Not  on file    Gets together: Not on file    Attends religious service: Not on file    Active member of club or organization: Not on file    Attends meetings of clubs or organizations: Not on file    Relationship status: Not on file  Other Topics Concern  . Not on file  Social History Narrative   Lives at home, married   Patient is right handed.   Patient drinks 1 cup caffeine daily.     Family History: The patient's family history includes Cancer in his father; Lung cancer in his unknown relative; Multiple sclerosis in his other, paternal uncle, sister, and sister; Parkinsonism in his maternal uncle; Seizures in his maternal uncle; Uterine cancer in his unknown relative. No cardiac family history.   ROS:   Please see the history of present illness.  Additional pertinent ROS:  Constitutional: Negative for chills, fever, night sweats, unintentional weight loss  HENT: Negative for ear pain and hearing loss.   Eyes: Negative for loss of vision and eye pain.  Respiratory: Negative for cough, sputum, shortness of breath, wheezing.   Cardiovascular: Positive for LE edema. Negative for chest pain, palpitations, PND, orthopnea, and claudication.  Gastrointestinal: Negative for abdominal pain, melena, and hematochezia.  Genitourinary: Negative for dysuria and hematuria.  Musculoskeletal: Negative for falls and myalgias. Positive for chronic weakness 2/2 MS Skin: Positive for chronic skin changes on lower extremities Neurological: Negative for loss of consciousness. Positive for chronic weakness 2/2 MS Endo/Heme/Allergies: Does not bruise/bleed easily.    EKGs/Labs/Other Studies Reviewed:    The following studies were reviewed today: Echo 07/20/18 Study Conclusions  - Left ventricle: The cavity size was normal. There was mild   concentric hypertrophy. Systolic function was normal. The   estimated ejection fraction was in the range of 55% to 60%. Wall   motion was normal; there were no  regional wall motion   abnormalities. Doppler parameters are consistent with abnormal   left ventricular relaxation (grade 1 diastolic dysfunction). - Aortic valve: Transvalvular velocity was within the normal range.   There was no stenosis. There was no regurgitation. - Mitral valve: Transvalvular velocity was within the normal range.   There was no evidence for stenosis. There was trivial   regurgitation. - Right ventricle: The cavity size was normal. Wall thickness was   normal. Systolic function was normal. - Tricuspid valve: There was trivial regurgitation. - Pulmonary arteries: Systolic pressure was within the normal   range.  EKG:  EKG is personally reviewed.  The ekg ordered today demonstrates normal sinus rhythm  Recent Labs: 07/19/2018: ALT 14; BUN 17; Creatinine, Ser 1.05; Hemoglobin 15.3; Platelets 253.0; Potassium 4.8; Sodium 138; TSH  0.82  Recent Lipid Panel    Component Value Date/Time   CHOL 234 (H) 08/14/2015 1216   TRIG 116.0 08/14/2015 1216   HDL 47.80 08/14/2015 1216   CHOLHDL 5 08/14/2015 1216   VLDL 23.2 08/14/2015 1216   LDLCALC 163 (H) 08/14/2015 1216   LDLDIRECT 192.1 08/04/2012 1502    Physical Exam:    VS:  BP 130/82   Pulse 63   Ht 5\' 10"  (1.778 m)   Wt 205 lb (93 kg)   SpO2 95%   BMI 29.41 kg/m     Wt Readings from Last 3 Encounters:  09/05/18 205 lb (93 kg)  05/20/16 175 lb (79.4 kg)  05/21/15 175 lb (79.4 kg)     GEN: Well nourished, well developed in no acute distress HEENT: Normal NECK: No JVD; No carotid bruits LYMPHATICS: No lymphadenopathy CARDIAC: regular rhythm, normal S1 and S2, no murmurs, rubs, gallops. Radial pulses 2+ bilaterally. RESPIRATORY:  Clear to auscultation without rales, wheezing or rhonchi  ABDOMEN: Soft, non-tender, non-distended MUSCULOSKELETAL:  Wheelchair bound, contractures in bilateral hands. Bilateral 2-3+ pitting edema to mid-shin. SKIN: LE mild discoloration/darkening of skin and raised areas from  prior wounds. No erythema NEUROLOGIC:  Alert and oriented x 3 PSYCHIATRIC:  Flat affect  ASSESSMENT:    1. Bilateral leg edema   2. Counseling on health promotion and disease prevention    PLAN:    1. Bilateral LE edema: systolic dysfunction and valvular disease excluded on recent echo. Does have mild diastolic dysfunction. However, for this to result in edema, it would also have to result in elevated pulmonary pressures (not seen) and likely shortness of breath. He has neither of these symptoms. Given this, my suspicion is that this may be chronic venous insufficiency based on his poor functional capacity/lack of muscular use, gravity from having his legs down most of the day, and long term peripheral neurovascular changes from multiple sclerosis. -agree with elevation. Recommend fitting for compression stockings that zip -if his feet weep, he should have his wounds examined and dressed to avoid infection -they prefer not to take lasix unless swelling is severe  2. Health promotion counseling -recommend heart healthy/Mediterranean diet, with whole grains, fruits, vegetable, fish, lean meats, nuts, and olive oil. Limit salt. -recommend doing any strengthening activity that he is capable of to maintain his muscle tone -recommend avoidance of tobacco products. Avoid excess alcohol. -Additional risk factor control:  -Diabetes: A1c is 5.4 from 2016. Recommend updating with his next blood work  -Lipids: from 2016: HDL 48, LDL 163, TG 116. Recommend updating with his next blood work  -Blood pressure control: within goal range  -Weight: BMI 29. Unable to do significant exercise, counseled on avoiding salt especially  Plan for follow up: 1 year TIME SPENT WITH PATIENT: >30 minutes of direct patient care. More than 50% of that time was spent on coordination of care and counseling regarding etiology of fluid retention from the heart, recommendations for LE edema.  Jodelle Red, MD,  PhD [place]  [facility]   Medication Adjustments/Labs and Tests Ordered: Current medicines are reviewed at length with the patient today.  Concerns regarding medicines are outlined above.  Orders Placed This Encounter  Procedures  . EKG 12-Lead   No orders of the defined types were placed in this encounter.   Patient Instructions  Medication Instructions:  Your Physician recommend you continue on your current medication as directed.    If you need a refill on your cardiac medications  before your next appointment, please call your pharmacy.   Lab work: None  Testing/Procedures: None  Follow-Up: At [facility], you and your health needs are our priority.  As part of our continuing mission to provide you with exceptional heart care, we have created designated Provider Care Teams.  These Care Teams include your primary Cardiologist (physician) and Advanced Practice Providers (APPs -  Physician Assistants and Nurse Practitioners) who all work together to provide you with the care you need, when you need it. You will need a follow up appointment in 1 years.  Please call our office 2 months in advance to schedule this appointment.  You may see Jodelle Red, MD or one of the following Advanced Practice Providers on your designated Care Team:   Theodore Demark, PA-C . Joni Reining, DNP, ANP  Any Other Special Instructions Will Be Listed Below (If Applicable).       Signed, Jodelle Red, MD PhD 09/05/2018 1:35 PM    [facility]

## 2018-09-10 ENCOUNTER — Other Ambulatory Visit: Payer: Self-pay | Admitting: Family Medicine

## 2018-09-12 ENCOUNTER — Ambulatory Visit: Payer: Self-pay | Admitting: *Deleted

## 2018-09-12 ENCOUNTER — Other Ambulatory Visit (INDEPENDENT_AMBULATORY_CARE_PROVIDER_SITE_OTHER): Payer: Medicare Other

## 2018-09-12 DIAGNOSIS — R350 Frequency of micturition: Secondary | ICD-10-CM

## 2018-09-12 LAB — POC URINALSYSI DIPSTICK (AUTOMATED)
Bilirubin, UA: NEGATIVE
Blood, UA: NEGATIVE
Glucose, UA: NEGATIVE
Ketones, UA: 5
Leukocytes, UA: NEGATIVE
Nitrite, UA: NEGATIVE
PH UA: 7 (ref 5.0–8.0)
Protein, UA: NEGATIVE
Spec Grav, UA: 1.02 (ref 1.010–1.025)
Urobilinogen, UA: 0.2 E.U./dL

## 2018-09-12 NOTE — Telephone Encounter (Signed)
Patient's wife came in today and stated she was advised to come in to pick up a cup in order to bring back for the pts urine test.  I was not aware of this and asked that Dr Selena Batten review the note below from the nurse at the [facility].  Per Dr Selena Batten a cup can be given and a urinalysis can be ordered and a urine culture if there are any abnormalities.  She also stated the pt would need to be referred to a urologist as the last urine culture was negative.  I informed the pts wife of this and she was given a specimen cup.

## 2018-09-12 NOTE — Telephone Encounter (Signed)
Patient reporting he has had frequency with voiding for 3 days. Stated he feels the need to to urinate every 20-30 minutes and is producing about half of his normal amount of urine. Denies burning, itching, discharge of any kind, no fever, No scrotal swelling or rectal problem. Reports he completed antibiotics one to two weeks ago for UTI. Didn't have any symptoms for about one week after completing the abx. Requesting to pick up sterile container for a urine sample to bring in today. Routing to PCP for consideration. Stated because he's in a wheelchair it is difficult for him to come in. LOV 08/18/18  Answer Assessment - Initial Assessment Questions 1. ANTIBIOTIC: "What antibiotic are you taking?" "How many times per day?"     Unable a find where abx was recently prescribed.  2. DURATION: "When was the antibiotic started?"     Approximately 3 weeks ago stated he was treated for UTI with abx 3. MAIN SYMPTOM: "What is the main symptom you are concerned about?"     Frequency about 20 minutes feels the need to void. 4. FEVER: "Do you have a fever?" If so, ask: "What is it, how was it measured, and when did it start?"    None reported  5. OTHER SYMPTOMS: "Do you have any other symptoms?" (e.g., flank pain, penile discharge, scrotal pain, blood in urine)     Denies all the listed symptoms.  Protocols used: URINARY TRACT INFECTION ON ANTIBIOTIC FOLLOW-UP CALL - MALE-A-AH

## 2018-09-22 ENCOUNTER — Other Ambulatory Visit: Payer: Self-pay | Admitting: *Deleted

## 2018-09-22 MED ORDER — FUROSEMIDE 20 MG PO TABS: 20.0000 mg | ORAL_TABLET | Freq: Every day | ORAL | 0 refills | Status: DC

## 2018-09-22 NOTE — Telephone Encounter (Signed)
Rx done. 

## 2018-10-08 ENCOUNTER — Other Ambulatory Visit: Payer: Self-pay | Admitting: Neurology

## 2018-12-22 ENCOUNTER — Encounter: Payer: Self-pay | Admitting: Neurology

## 2018-12-22 ENCOUNTER — Other Ambulatory Visit: Payer: Self-pay | Admitting: Neurology

## 2018-12-22 ENCOUNTER — Other Ambulatory Visit: Payer: Self-pay

## 2018-12-22 ENCOUNTER — Ambulatory Visit (INDEPENDENT_AMBULATORY_CARE_PROVIDER_SITE_OTHER): Payer: Medicare Other | Admitting: Neurology

## 2018-12-22 VITALS — BP 119/78 | HR 61

## 2018-12-22 DIAGNOSIS — R269 Unspecified abnormalities of gait and mobility: Secondary | ICD-10-CM

## 2018-12-22 DIAGNOSIS — G4489 Other headache syndrome: Secondary | ICD-10-CM

## 2018-12-22 DIAGNOSIS — G35 Multiple sclerosis: Secondary | ICD-10-CM | POA: Diagnosis not present

## 2018-12-22 DIAGNOSIS — Z5181 Encounter for therapeutic drug level monitoring: Secondary | ICD-10-CM

## 2018-12-22 DIAGNOSIS — F482 Pseudobulbar affect: Secondary | ICD-10-CM | POA: Diagnosis not present

## 2018-12-22 HISTORY — DX: Other headache syndrome: G44.89

## 2018-12-22 MED ORDER — GABAPENTIN 100 MG PO CAPS: 200.0000 mg | ORAL_CAPSULE | Freq: Three times a day (TID) | ORAL | 1 refills | Status: DC

## 2018-12-22 NOTE — Progress Notes (Signed)
Reason for visit: Multiple sclerosis  [name] is an 58 y.o. 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  The patient has had ongoing progression of his disease, he is gradually losing function of the right arm.  The patient is nonambulatory.  He uses a motorized wheelchair for mobility.  He has over the last 2 weeks had worsening of his headaches, he is now having daily headaches.  His headaches previously were improved with low-dose gabapentin taking 100 mg 3 times daily.  The patient describes a throbbing pain with occasional sharp jabs in the head.  He does have some neck discomfort.  He claims that his pharyngeal spasms have improved, they may occur very occasionally but now are no longer a significant problem for him.  He does have some spasticity in the legs, occasionally this may be uncomfortable.  Past Medical History:  Diagnosis Date  . Abnormality of gait 11/21/2015  . Asthma    childhood asthma  . Classic migraine   . Depression   . Dysphagia   . Hay fever   . MS (multiple sclerosis) (HCC)   . 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 eye surgeries     x 2; bilateral 74 and 65    Family History  Problem Relation Age of Onset  . Cancer Father   . Multiple sclerosis Sister   . Seizures Maternal Uncle   . Parkinsonism Maternal Uncle   . Multiple sclerosis Sister   . Multiple sclerosis Paternal Uncle   . Multiple sclerosis Other   . Lung cancer Unknown        parent  . Uterine cancer Unknown        other    Social history:  reports that he has quit smoking. He has never used smokeless tobacco. He reports that he does not drink alcohol or use drugs.   No Known Allergies  Medications:  Prior to Admission medications   Medication Sig Start Date End Date Taking? Authorizing Provider   albuterol (PROVENTIL HFA;VENTOLIN HFA) 108 (90 Base) MCG/ACT inhaler Inhale 2 puffs into the lungs every 6 (six) hours as needed. 07/19/18   Terressa Koyanagi, DO  baclofen (LIORESAL) 10 MG tablet 1/2 tablet in the morning and at noon, and 2 tablets at night 05/20/16   York Spaniel, MD  citalopram (CELEXA) 40 MG tablet Take 1 tablet (40 mg total) by mouth daily. 08/15/14   York Spaniel, MD  Dextromethorphan-Quinidine 20-10 MG CAPS Take 1 tablet by mouth 2 (two) times daily. 05/27/17   York Spaniel, MD  diazepam (VALIUM) 2 MG tablet Take 2 mg by mouth 3 (three) times daily.    [provider]  furosemide (LASIX) 20 MG tablet TAKE 1 TABLET BY MOUTH DAILY FOR 2 TO 3 DAYS AS NEEDED FOR SWELLING. 09/26/18   Terressa Koyanagi, DO  gabapentin (NEURONTIN) 100 MG capsule TAKE 1 CAPSULE BY MOUTH THREE TIMES A DAY 10/10/18   York Spaniel, MD  LINZESS 290 MCG CAPS capsule TAKE ONE CAPSULE BY MOUTH EVERY DAY 04/18/14   York Spaniel, MD  naproxen sodium (ANAPROX) 220 MG tablet Take 220 mg by mouth 2 (two) times daily with a meal.    [provider]  ocrelizumab 600 mg in sodium chloride 0.9 % 500 mL Inject 600  mg into the vein once.    [provider]    ROS:  Out of a complete 14 system review of symptoms, the patient complains only of the following symptoms, and all other reviewed systems are negative.  Fatigue, weight gain Difficulty swallowing Double vision, blurred vision Wheezing, shortness of breath, choking Leg swelling Heat intolerance Constipation, incontinence of the bowels Incontinence of the bladder Joint pain, aching muscles, muscle cramps, walking difficulty, neck pain, neck stiffness Dizziness, headache, numbness, weakness Depression  Blood pressure 119/78, pulse 61.  Physical Exam  General: The patient is alert and cooperative at the time of the examination.  Skin: 2-3+ edema below the knees is seen bilaterally.   Neurologic Exam  Mental  status: The patient is alert and oriented x 3 at the time of the examination. The patient has apparent normal recent and remote memory, with an apparently normal attention span and concentration ability.   Cranial nerves: Facial symmetry is present. Speech is dysarthric, ataxic in nature, not aphasic. Extraocular movements are full.  The patient has bilateral INO findings.  Visual fields are full.  Pupils are equal, round, and reactive to light.  Discs are flat bilaterally.  Motor: The patient has 4/5 strength with grip in the hands bilaterally, 3/5 strength with biceps and triceps both arms, 4-/5 strength of deltoid muscles bilaterally.  With the lower extremities, the patient is able to extend the knee slightly, increased motor tone is noted bilaterally.  He is not able to flex at the hips much.  Sensory examination: Soft touch sensation is symmetric on the face, arms, and legs.  Coordination: The patient has ataxia with finger-nose-finger bilaterally.  The patient is unable to perform tandem gait on either side..  Gait and station: The gait cannot be tested, the patient is wheelchair-bound.  Reflexes: Deep tendon reflexes are symmetric.   Assessment/Plan:  1.  Multiple sclerosis  2.  Spastic quadriparesis  3.  Daily headache  4.  Pseudobulbar affect  The patient will be increased on the gabapentin to 200 mg 3 times daily.  This may also help some of the spasticity in the legs.  In the future, we may consider addition of baclofen.  Blood work will be done today, the patient remains on Ocrevus and seems to be tolerating the medication fairly well.  His next infusion is an June 2020.  The patient will follow-up in 6 months.  The pseudobulbar affect has been helped with medication.   [name] MD 12/22/2018 8:56 AM  [facility] [address] [place], [place] [address]  Phone [phone] Fax [phone]

## 2018-12-23 LAB — CBC WITH DIFFERENTIAL/PLATELET
Basophils Absolute: 0.1 10*3/uL (ref 0.0–0.2)
Basos: 1 %
EOS (ABSOLUTE): 0.1 10*3/uL (ref 0.0–0.4)
Eos: 2 %
HEMATOCRIT: 47.5 % (ref 37.5–51.0)
Hemoglobin: 15.5 g/dL (ref 13.0–17.7)
Immature Grans (Abs): 0 10*3/uL (ref 0.0–0.1)
Immature Granulocytes: 0 %
Lymphocytes Absolute: 0.5 10*3/uL — ABNORMAL LOW (ref 0.7–3.1)
Lymphs: 9 %
MCH: 30.7 pg (ref 26.6–33.0)
MCHC: 32.6 g/dL (ref 31.5–35.7)
MCV: 94 fL (ref 79–97)
Monocytes Absolute: 0.6 10*3/uL (ref 0.1–0.9)
Monocytes: 10 %
NEUTROS ABS: 4.5 10*3/uL (ref 1.4–7.0)
Neutrophils: 78 %
Platelets: 277 10*3/uL (ref 150–450)
RBC: 5.05 x10E6/uL (ref 4.14–5.80)
RDW: 13 % (ref 11.6–15.4)
WBC: 5.8 10*3/uL (ref 3.4–10.8)

## 2018-12-23 LAB — COMPREHENSIVE METABOLIC PANEL
A/G RATIO: 2.5 — AB (ref 1.2–2.2)
ALT: 18 IU/L (ref 0–44)
AST: 14 IU/L (ref 0–40)
Albumin: 4.7 g/dL (ref 3.8–4.9)
Alkaline Phosphatase: 125 IU/L — ABNORMAL HIGH (ref 39–117)
BUN/Creatinine Ratio: 11 (ref 9–20)
BUN: 12 mg/dL (ref 6–24)
Bilirubin Total: 0.7 mg/dL (ref 0.0–1.2)
CHLORIDE: 102 mmol/L (ref 96–106)
CO2: 25 mmol/L (ref 20–29)
Calcium: 9.6 mg/dL (ref 8.7–10.2)
Creatinine, Ser: 1.12 mg/dL (ref 0.76–1.27)
GFR calc Af Amer: 85 mL/min/{1.73_m2} (ref >59)
GFR calc non Af Amer: 74 mL/min/{1.73_m2} (ref >59)
Globulin, Total: 1.9 g/dL (ref 1.5–4.5)
Glucose: 82 mg/dL (ref 65–99)
Potassium: 4.6 mmol/L (ref 3.5–5.2)
Sodium: 143 mmol/L (ref 134–144)
Total Protein: 6.6 g/dL (ref 6.0–8.5)

## 2018-12-28 NOTE — Telephone Encounter (Signed)
Error

## 2019-06-27 ENCOUNTER — Other Ambulatory Visit: Payer: Self-pay

## 2019-06-27 ENCOUNTER — Encounter: Payer: Self-pay | Admitting: Neurology

## 2019-06-27 ENCOUNTER — Ambulatory Visit (INDEPENDENT_AMBULATORY_CARE_PROVIDER_SITE_OTHER): Payer: Medicare Other | Admitting: Neurology

## 2019-06-27 VITALS — BP 126/84 | HR 62 | Temp 98.0°F

## 2019-06-27 DIAGNOSIS — N319 Neuromuscular dysfunction of bladder, unspecified: Secondary | ICD-10-CM

## 2019-06-27 DIAGNOSIS — G35 Multiple sclerosis: Secondary | ICD-10-CM | POA: Diagnosis not present

## 2019-06-27 DIAGNOSIS — Z5181 Encounter for therapeutic drug level monitoring: Secondary | ICD-10-CM

## 2019-06-27 DIAGNOSIS — F482 Pseudobulbar affect: Secondary | ICD-10-CM | POA: Diagnosis not present

## 2019-06-27 NOTE — Progress Notes (Signed)
Reason for visit: Multiple sclerosis  [name] is an 58 y.o. male  History of present illness:  [name] is a 58 year old right-handed white male with a history of multiple sclerosis associated with a quadriparesis, he is nonambulatory.  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  He continues to report some double vision even when he covers one eye or the other.  He is followed through the [facility] hospital, he will soon have an ophthalmology evaluation.  The patient is having some neck stiffness, he continues to have progressive weakness involving the right arm.  He is on Ocrevus currently.  He is swallowing fairly well without choking.  He tolerates Ocrevus well.   Past Medical History:  Diagnosis Date  . Abnormality of gait 11/21/2015  . Asthma    childhood asthma  . Classic migraine   . Depression   . Dysphagia   . Hay fever   . Headache syndrome 12/22/2018  . MS (multiple sclerosis) ([place])   . 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 eye surgeries     x 2; bilateral 40 and 15    Family History  Problem Relation Age of Onset  . Cancer Father   . Multiple sclerosis Sister   . Seizures Maternal Uncle   . Parkinsonism Maternal Uncle   . Multiple sclerosis Sister   . Multiple sclerosis Paternal Uncle   . Multiple sclerosis Other   . Lung cancer Unknown        parent  . Uterine cancer Unknown        other    Social history:  reports that he has quit smoking. He has never used smokeless tobacco. He reports that he does not drink alcohol or use drugs.   No Known Allergies  Medications:  Prior to Admission medications   Medication Sig Start Date End Date Taking? Authorizing Provider  albuterol (PROVENTIL HFA;VENTOLIN HFA) 108 (90 Base) MCG/ACT inhaler Inhale 2 puffs into the lungs every 6 (six) hours as needed. 07/19/18   Lucretia Kern, DO  baclofen (LIORESAL) 10 MG tablet  1/2 tablet in the morning and at noon, and 2 tablets at night 05/20/16   Kathrynn Ducking, MD  citalopram (CELEXA) 40 MG tablet Take 1 tablet (40 mg total) by mouth daily. 08/15/14   Kathrynn Ducking, MD  Dextromethorphan-Quinidine 20-10 MG CAPS Take 1 tablet by mouth 2 (two) times daily. 05/27/17   Kathrynn Ducking, MD  diazepam (VALIUM) 2 MG tablet Take 2 mg by mouth 3 (three) times daily.    [provider]  furosemide (LASIX) 20 MG tablet TAKE 1 TABLET BY MOUTH DAILY FOR 2 TO 3 DAYS AS NEEDED FOR SWELLING. 09/26/18   Lucretia Kern, DO  gabapentin (NEURONTIN) 100 MG capsule Take 2 capsules (200 mg total) by mouth 3 (three) times daily. 12/22/18   Kathrynn Ducking, MD  LINZESS 290 MCG CAPS capsule TAKE ONE CAPSULE BY MOUTH EVERY DAY 04/18/14   Kathrynn Ducking, MD  naproxen sodium (ANAPROX) 220 MG tablet Take 220 mg by mouth 2 (two) times daily with a meal.    [provider]  ocrelizumab 600 mg in sodium chloride 0.9 % 500 mL Inject 600 mg into the vein once.    [provider]    ROS:  Out of a complete 14 system review of symptoms, the patient complains only  of the following symptoms, and all other reviewed systems are negative.  Weakness Double vision Neck pain  Blood pressure 126/84, pulse 62, temperature 98 F (36.7 C), temperature source Temporal, SpO2 94 %.  Physical Exam  General: The patient is alert and cooperative at the time of the examination.  Skin: No significant peripheral edema is noted.   Neurologic Exam  Mental status: The patient is alert and oriented x 3 at the time of the examination. The patient has apparent normal recent and remote memory, with an apparently normal attention span and concentration ability.   Cranial nerves: Facial symmetry is present. Speech is normal, no aphasia or dysarthria is noted. Extraocular movements are full.  The patient appears to have bilateral INO.  Visual fields are full.  Pupils are equal, round, and  reactive to light.  Discs are flat bilaterally.  Motor: The patient has good grip with the left hand, decreased grip with the right, diffusely has 4/5 strength of the right arm, more normal strength of the left.  The patient has ability to extend at the knees bilaterally with 3/5 strength, unable to lift the knees on either side with hip flexion.  Increased motor tone is noted in both legs.  Sensory examination: Soft touch sensation is symmetric on the face, arms, and legs.  Coordination: The patient has dysmetria with finger-nose-finger bilaterally, he is unable perform heel-to-shin on either side.  Gait and station: The patient is nonambulatory.  Reflexes: Deep tendon reflexes are symmetric.   Assessment/Plan:  1.  Multiple sclerosis, secondary progressive  2.  Gait disorder  3.  Neurogenic bladder  The patient will be sent for MRI of the brain and cervical spine, MRI of the brain has not been done since 2015.  Blood work will be done today, he will continue the Ocrevus.  He reports monocular double vision, he will have an ophthalmology evaluation in the near future.  The patient clearly has eye movement abnormalities with bilateral INO identified.  He will follow-up in 6 months.  [name] MD 06/27/2019 12:18 PM  [facility] [address] [place], [place] [address]  Phone [phone] Fax [phone]

## 2019-06-28 ENCOUNTER — Telehealth: Payer: Self-pay | Admitting: Neurology

## 2019-06-28 NOTE — Telephone Encounter (Signed)
Medicare no auth. Patient is in wheelchair his appt is scheduled at [facility] because they have a wheel chair lift.[name] He is scheduled for 07/11/19 arrival time is 2:30 pm. Patient is aware of time and day. He is also aware that one visitor can go with him.

## 2019-07-11 ENCOUNTER — Other Ambulatory Visit: Payer: Self-pay

## 2019-07-11 ENCOUNTER — Ambulatory Visit (HOSPITAL_COMMUNITY)
Admission: RE | Admit: 2019-07-11 | Discharge: 2019-07-11 | Disposition: A | Discharge status: Home / Self Care | Payer: Medicare Other | Source: Ambulatory Visit | Attending: Neurology | Admitting: Neurology

## 2019-07-11 ENCOUNTER — Ambulatory Visit (HOSPITAL_COMMUNITY): Payer: Medicare Other

## 2019-07-11 DIAGNOSIS — G35 Multiple sclerosis: Secondary | ICD-10-CM | POA: Insufficient documentation

## 2019-07-11 DIAGNOSIS — M50223 Other cervical disc displacement at C6-C7 level: Secondary | ICD-10-CM | POA: Diagnosis not present

## 2019-07-11 DIAGNOSIS — M4802 Spinal stenosis, cervical region: Secondary | ICD-10-CM | POA: Diagnosis not present

## 2019-07-11 MED ORDER — GADOBUTROL 1 MMOL/ML IV SOLN
10.0000 mL | Freq: Once | INTRAVENOUS | Status: AC | PRN
  Administered 2019-07-11: 18:00:00 10 mL via INTRAVENOUS

## 2019-07-12 ENCOUNTER — Telehealth: Payer: Self-pay | Admitting: Neurology

## 2019-07-12 NOTE — Telephone Encounter (Signed)
  I called the patient.  MRI of the brain was stable from 2015, moderate involvement with periventricular plaques seen.  The patient had motion degraded MRI cervical spine study, could not get much information from this, in the past, he has had documented spinal cord plaques that are likely leading to progressive disability.  MRI brain and cervical 07/12/19:  IMPRESSION: 1. Moderate multiple sclerosis in the brain. Periventricular white matter lesions stable since 2015. Contraction of left pons and left thalamus plaque. No new plaque. No acute demyelinization. 2. Cervical spine study significantly degraded by motion. There are probable lesions in the spinal cord consistent with multiple sclerosis. 3. Cervical spondylosis at multiple levels causing spinal and foraminal encroachment as described above

## 2019-10-25 ENCOUNTER — Telehealth: Payer: Self-pay

## 2019-10-25 DIAGNOSIS — G35 Multiple sclerosis: Secondary | ICD-10-CM

## 2019-10-25 DIAGNOSIS — R269 Unspecified abnormalities of gait and mobility: Secondary | ICD-10-CM

## 2019-10-25 NOTE — Telephone Encounter (Signed)
I returned the call and spoke to the patient's wife.  They would like to pick up a written prescription from our office.  They are unsure which DME company they are going to use at this time.

## 2019-10-25 NOTE — Telephone Encounter (Signed)
Patient called stating medicare is requesting a prescription for a  Medical lift chair.

## 2019-10-25 NOTE — Telephone Encounter (Signed)
I patient, he has been using a lift chair at home to help him stand and transfer over to a wheelchair with assistance from his wife.  His current lift chair is nonfunctional, he wishes to get a replacement for this.  I will write a prescription for him.

## 2019-10-25 NOTE — Addendum Note (Signed)
Addended by: York Spaniel on: 10/25/2019 01:00 PM   Modules accepted: Orders

## 2019-11-15 LAB — CBC WITH DIFFERENTIAL/PLATELET
Basophils Absolute: 0 10*3/uL (ref 0.0–0.2)
Basos: 1 %
EOS (ABSOLUTE): 0.1 10*3/uL (ref 0.0–0.4)
Eos: 2 %
Hematocrit: 46.9 % (ref 37.5–51.0)
Hemoglobin: 15.8 g/dL (ref 13.0–17.7)
Immature Grans (Abs): 0 10*3/uL (ref 0.0–0.1)
Immature Granulocytes: 0 %
Lymphocytes Absolute: 0.8 10*3/uL (ref 0.7–3.1)
Lymphs: 13 %
MCH: 30.4 pg (ref 26.6–33.0)
MCHC: 33.7 g/dL (ref 31.5–35.7)
MCV: 90 fL (ref 79–97)
Monocytes Absolute: 0.7 10*3/uL (ref 0.1–0.9)
Monocytes: 11 %
Neutrophils Absolute: 4.5 10*3/uL (ref 1.4–7.0)
Neutrophils: 73 %
Platelets: 261 10*3/uL (ref 150–450)
RBC: 5.2 x10E6/uL (ref 4.14–5.80)
RDW: 12.7 % (ref 11.6–15.4)
WBC: 6.1 10*3/uL (ref 3.4–10.8)

## 2019-11-15 LAB — COMPREHENSIVE METABOLIC PANEL
ALT: 14 IU/L (ref 0–44)
AST: 12 IU/L (ref 0–40)
Albumin/Globulin Ratio: 2.5 — ABNORMAL HIGH (ref 1.2–2.2)
Albumin: 4.7 g/dL (ref 3.8–4.9)
Alkaline Phosphatase: 129 IU/L — ABNORMAL HIGH (ref 39–117)
BUN/Creatinine Ratio: 15 (ref 9–20)
BUN: 17 mg/dL (ref 6–24)
Bilirubin Total: 0.8 mg/dL (ref 0.0–1.2)
CO2: 24 mmol/L (ref 20–29)
Calcium: 9.6 mg/dL (ref 8.7–10.2)
Chloride: 100 mmol/L (ref 96–106)
Creatinine, Ser: 1.15 mg/dL (ref 0.76–1.27)
GFR calc Af Amer: 82 mL/min/{1.73_m2} (ref >59)
GFR calc non Af Amer: 71 mL/min/{1.73_m2} (ref >59)
Globulin, Total: 1.9 g/dL (ref 1.5–4.5)
Glucose: 80 mg/dL (ref 65–99)
Potassium: 4.6 mmol/L (ref 3.5–5.2)
Sodium: 141 mmol/L (ref 134–144)
Total Protein: 6.6 g/dL (ref 6.0–8.5)

## 2019-11-15 LAB — CD19 AND CD20, FLOW CYTOMETRY

## 2019-12-27 ENCOUNTER — Other Ambulatory Visit: Payer: Self-pay

## 2019-12-27 ENCOUNTER — Encounter: Payer: Self-pay | Admitting: Neurology

## 2019-12-27 ENCOUNTER — Ambulatory Visit (INDEPENDENT_AMBULATORY_CARE_PROVIDER_SITE_OTHER): Payer: Medicare Other | Admitting: Neurology

## 2019-12-27 VITALS — BP 111/78 | HR 72 | Temp 97.2°F

## 2019-12-27 DIAGNOSIS — G8250 Quadriplegia, unspecified: Secondary | ICD-10-CM | POA: Insufficient documentation

## 2019-12-27 DIAGNOSIS — G35 Multiple sclerosis: Secondary | ICD-10-CM | POA: Diagnosis not present

## 2019-12-27 NOTE — Patient Instructions (Signed)
It was great meeting with you today and sharing your story  Continue Ocrevus, will check blood work today See you back in 6 months

## 2019-12-27 NOTE — Progress Notes (Signed)
PATIENT: [name] DOB: Feb 21, 1961  REASON FOR VISIT: follow up HISTORY FROM: patient  HISTORY OF PRESENT ILLNESS: Today 12/27/19  [name] is a 59 year old male with history of multiple sclerosis associated with quadriparesis, he is not ambulatory.  He has seen a bulbar affect, is on medication with good improvement.  He is currently on Ocrevus.  He has double vision, will have ophthalmology evaluation next week.  He had MRI of the brain and cervical spine in September 2020, was stable from 2015, moderate involvement with periventricular plaques seen, MRI of the cervical spine was motion degraded.  He seems to do well with Ocrevus, next infusion is June 18.  He has progressive weakness of the right arm.  He denies any new problems or concerns.  He presents today accompanied by his wife.  HISTORY 06/27/2019 Dr. Anne Hahn: [name] is a 59 year old right-handed white male with a history of multiple sclerosis associated with a quadriparesis, he is nonambulatory.  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  He continues to report some double vision even when he covers one eye or the other.  He is followed through the [facility] hospital, he will soon have an ophthalmology evaluation.  The patient is having some neck stiffness, he continues to have progressive weakness involving the right arm.  He is on Ocrevus currently.  He is swallowing fairly well without choking.  He tolerates Ocrevus well.   REVIEW OF SYSTEMS: Out of a complete 14 system review of symptoms, the patient complains only of the following symptoms, and all other reviewed systems are negative.  Weakness  ALLERGIES: No Known Allergies  HOME MEDICATIONS: Outpatient Medications Prior to Visit  Medication Sig Dispense Refill  . albuterol (PROVENTIL HFA;VENTOLIN HFA) 108 (90 Base) MCG/ACT inhaler Inhale 2 puffs into the lungs every 6 (six) hours as needed. 1 Inhaler 0  . baclofen  (LIORESAL) 10 MG tablet 1/2 tablet in the morning and at noon, and 2 tablets at night 270 each 3  . citalopram (CELEXA) 40 MG tablet Take 1 tablet (40 mg total) by mouth daily. 90 tablet 1  . Dextromethorphan-Quinidine 20-10 MG CAPS Take 1 tablet by mouth 2 (two) times daily. 60 capsule 3  . naproxen sodium (ANAPROX) 220 MG tablet Take 220 mg by mouth 2 (two) times daily with a meal.    . ocrelizumab 600 mg in sodium chloride 0.9 % 500 mL Inject 600 mg into the vein every 6 (six) months.     . tamsulosin (FLOMAX) 0.4 MG CAPS capsule Take 0.8 mg by mouth daily.    Marland Kitchen gabapentin (NEURONTIN) 100 MG capsule Take 2 capsules (200 mg total) by mouth 3 (three) times daily. 540 capsule 1   No facility-administered medications prior to visit.    PAST MEDICAL HISTORY: Past Medical History:  Diagnosis Date  . Abnormality of gait 11/21/2015  . Asthma    childhood asthma  . Classic migraine   . Depression   . Dysphagia   . Hay fever   . Headache syndrome 12/22/2018  . MS (multiple sclerosis) ([facility])   . 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 eye surgeries     x 2; bilateral 74 and 76    FAMILY HISTORY: Family History  Problem Relation Age of Onset  . Cancer Father   . Multiple sclerosis Sister   . Seizures Maternal Uncle   .  Parkinsonism Maternal Uncle   . Multiple sclerosis Sister   . Multiple sclerosis Paternal Uncle   . Multiple sclerosis Other   . Lung cancer Unknown        parent  . Uterine cancer Unknown        other    SOCIAL HISTORY: Social History   Socioeconomic History  . Marital status: Married    Spouse name: joy  . Number of children: 2  . Years of education: college  . Highest education level: Not on file  Occupational History  . Occupation: disabled  Tobacco Use  . Smoking status: Former Games developer  . Smokeless tobacco: Never Used  Substance and Sexual Activity  . Alcohol use: No    Comment:  rare   . Drug use: No  . Sexual activity: Not on file  Other Topics Concern  . Not on file  Social History Narrative   Lives at home, married   Patient is right handed.   Patient drinks 1 cup caffeine daily.   Social Determinants of Health   Financial Resource Strain:   . Difficulty of Paying Living Expenses:   Food Insecurity:   . Worried About Programme researcher, broadcasting/film/video in the Last Year:   . Barista in the Last Year:   Transportation Needs:   . Freight forwarder (Medical):   Marland Kitchen Lack of Transportation (Non-Medical):   Physical Activity:   . Days of Exercise per Week:   . Minutes of Exercise per Session:   Stress:   . Feeling of Stress :   Social Connections:   . Frequency of Communication with Friends and Family:   . Frequency of Social Gatherings with Friends and Family:   . Attends Religious Services:   . Active Member of Clubs or Organizations:   . Attends Banker Meetings:   Marland Kitchen Marital Status:   Intimate Partner Violence:   . Fear of Current or Ex-Partner:   . Emotionally Abused:   Marland Kitchen Physically Abused:   . Sexually Abused:       PHYSICAL EXAM  Vitals:   12/27/19 1104  BP: 111/78  Pulse: 72  Temp: (!) 97.2 F (36.2 C)   There is no height or weight on file to calculate BMI.  Generalized: Well developed, in no acute distress   Neurological examination  Mentation: Alert oriented to time, place, history taking. Follows all commands speech and language fluent Cranial nerve II-XII: Pupils were equal round reactive to light. Extraocular movements were full, visual field were full on confrontational test, appears to have bilateral INO. Facial sensation and strength were normal.  Motor: Strong grip of the left hand, decreased grip with the right, has 3/5 strength on the right arm, normal on the left.  Unable to lift the knees on either side with hip flexion, increased motor tone in both legs. Sensory: Sensory testing is intact to soft touch on  all 4 extremities. No evidence of extinction is noted.  Coordination: Dysmetria with finger-nose-finger early bilaterally, is unable to perform heel-to-shin Gait and station: Is nonambulatory Reflexes: Deep tendon reflexes are symmetric   DIAGNOSTIC DATA (LABS, IMAGING, TESTING) - I reviewed patient records, labs, notes, testing and imaging myself where available.  Lab Results  Component Value Date   WBC 6.1 06/27/2019   HGB 15.8 06/27/2019   HCT 46.9 06/27/2019   MCV 90 06/27/2019   PLT 261 06/27/2019      Component Value Date/Time   NA 141 06/27/2019  1230   K 4.6 06/27/2019 1230   CL 100 06/27/2019 1230   CO2 24 06/27/2019 1230   GLUCOSE 80 06/27/2019 1230   GLUCOSE 82 07/19/2018 1128   BUN 17 06/27/2019 1230   CREATININE 1.15 06/27/2019 1230   CALCIUM 9.6 06/27/2019 1230   PROT 6.6 06/27/2019 1230   ALBUMIN 4.7 06/27/2019 1230   AST 12 06/27/2019 1230   ALT 14 06/27/2019 1230   ALKPHOS 129 (H) 06/27/2019 1230   BILITOT 0.8 06/27/2019 1230   GFRNONAA 71 06/27/2019 1230   GFRAA 82 06/27/2019 1230   Lab Results  Component Value Date   CHOL 234 (H) 08/14/2015   HDL 47.80 08/14/2015   LDLCALC 163 (H) 08/14/2015   LDLDIRECT 192.1 08/04/2012   TRIG 116.0 08/14/2015   CHOLHDL 5 08/14/2015   Lab Results  Component Value Date   HGBA1C 5.4 08/14/2015   No results found for: VITAMINB12 Lab Results  Component Value Date   TSH 0.82 07/19/2018    ASSESSMENT AND PLAN 59 y.o. year old male  has a past medical history of Abnormality of gait (11/21/2015), Asthma, Classic migraine, Depression, Dysphagia, Hay fever, Headache syndrome (12/22/2018), MS (multiple sclerosis) ([place]), and Pseudobulbar affect (05/27/2017). here with:  1.  Multiple sclerosis, secondary progressive 2.  Spastic quadriparesis 3.  Pseudobulbar affect   Overall, he remains stable, continues to have some progressive weakness of the right arm.  He will remain on Ocrevus.  His next infusion is in June 2021.   I will check routine blood work today while on Ocrevus, along with IgG, IgA, IgM.  He will be having ophthalmology evaluation next week for double vision.  He is on baclofen, Celexa, and Nuedexta.  He will follow-up in 6 months or sooner if needed.  I spent 15 minutes with the patient. 50% of this time was spent discussing his plan of care.   Butler Denmark, AGNP-C, DNP 12/27/2019, 11:13 AM [facility] [address] [place], [place] [address] [phone]

## 2019-12-28 ENCOUNTER — Telehealth: Payer: Self-pay | Admitting: *Deleted

## 2019-12-28 LAB — CBC WITH DIFFERENTIAL/PLATELET
Basophils Absolute: 0 10*3/uL (ref 0.0–0.2)
Basos: 0 %
EOS (ABSOLUTE): 0.1 10*3/uL (ref 0.0–0.4)
Eos: 1 %
Hematocrit: 48 % (ref 37.5–51.0)
Hemoglobin: 16.5 g/dL (ref 13.0–17.7)
Immature Grans (Abs): 0 10*3/uL (ref 0.0–0.1)
Immature Granulocytes: 0 %
Lymphocytes Absolute: 0.4 10*3/uL — ABNORMAL LOW (ref 0.7–3.1)
Lymphs: 9 %
MCH: 30.7 pg (ref 26.6–33.0)
MCHC: 34.4 g/dL (ref 31.5–35.7)
MCV: 89 fL (ref 79–97)
Monocytes Absolute: 0.7 10*3/uL (ref 0.1–0.9)
Monocytes: 14 %
Neutrophils Absolute: 3.8 10*3/uL (ref 1.4–7.0)
Neutrophils: 76 %
Platelets: 202 10*3/uL (ref 150–450)
RBC: 5.38 x10E6/uL (ref 4.14–5.80)
RDW: 13.5 % (ref 11.6–15.4)
WBC: 5 10*3/uL (ref 3.4–10.8)

## 2019-12-28 LAB — COMPREHENSIVE METABOLIC PANEL
ALT: 22 IU/L (ref 0–44)
AST: 20 IU/L (ref 0–40)
Albumin/Globulin Ratio: 2.6 — ABNORMAL HIGH (ref 1.2–2.2)
Albumin: 4.6 g/dL (ref 3.8–4.9)
Alkaline Phosphatase: 128 IU/L — ABNORMAL HIGH (ref 39–117)
BUN/Creatinine Ratio: 14 (ref 9–20)
BUN: 13 mg/dL (ref 6–24)
Bilirubin Total: 0.8 mg/dL (ref 0.0–1.2)
CO2: 25 mmol/L (ref 20–29)
Calcium: 9.5 mg/dL (ref 8.7–10.2)
Chloride: 101 mmol/L (ref 96–106)
Creatinine, Ser: 0.95 mg/dL (ref 0.76–1.27)
GFR calc Af Amer: 103 mL/min/{1.73_m2} (ref >59)
GFR calc non Af Amer: 89 mL/min/{1.73_m2} (ref >59)
Globulin, Total: 1.8 g/dL (ref 1.5–4.5)
Glucose: 84 mg/dL (ref 65–99)
Potassium: 4.4 mmol/L (ref 3.5–5.2)
Sodium: 139 mmol/L (ref 134–144)
Total Protein: 6.4 g/dL (ref 6.0–8.5)

## 2019-12-28 LAB — IGG, IGA, IGM
IgA/Immunoglobulin A, Serum: 136 mg/dL (ref 90–386)
IgG (Immunoglobin G), Serum: 710 mg/dL (ref 603–1613)
IgM (Immunoglobulin M), Srm: 47 mg/dL (ref 20–172)

## 2019-12-28 NOTE — Progress Notes (Signed)
I have read the note, and I agree with the clinical assessment and plan.  [name] K [name]   

## 2019-12-28 NOTE — Telephone Encounter (Signed)
-----   Message from Glean Salvo, NP sent at 12/28/2019 10:34 AM EDT ----- Please let the patient know, labs are stable, will not alter dosing schedule of Ocrevus.  Alkaline phosphatase, mildly elevated stable.

## 2019-12-28 NOTE — Telephone Encounter (Signed)
Called and spoke with pt/wife about results per SS, NP note. They verbalized understanding.

## 2020-01-18 DIAGNOSIS — N390 Urinary tract infection, site not specified: Secondary | ICD-10-CM | POA: Diagnosis not present

## 2020-01-18 DIAGNOSIS — R319 Hematuria, unspecified: Secondary | ICD-10-CM | POA: Diagnosis not present

## 2020-02-01 DIAGNOSIS — R319 Hematuria, unspecified: Secondary | ICD-10-CM | POA: Diagnosis not present

## 2020-02-01 DIAGNOSIS — N390 Urinary tract infection, site not specified: Secondary | ICD-10-CM | POA: Diagnosis not present

## 2020-02-01 DIAGNOSIS — R309 Painful micturition, unspecified: Secondary | ICD-10-CM | POA: Diagnosis not present

## 2020-02-05 DIAGNOSIS — N390 Urinary tract infection, site not specified: Secondary | ICD-10-CM | POA: Diagnosis not present

## 2020-02-05 DIAGNOSIS — R319 Hematuria, unspecified: Secondary | ICD-10-CM | POA: Diagnosis not present

## 2020-02-22 ENCOUNTER — Encounter (HOSPITAL_COMMUNITY): Payer: Self-pay | Admitting: Emergency Medicine

## 2020-02-22 ENCOUNTER — Inpatient Hospital Stay (HOSPITAL_COMMUNITY)
Admission: EM | Admit: 2020-02-22 | Discharge: 2020-02-27 | DRG: 871 | Disposition: A | Discharge status: Home Health | Payer: No Typology Code available for payment source | Attending: Internal Medicine | Admitting: Internal Medicine

## 2020-02-22 ENCOUNTER — Other Ambulatory Visit: Payer: Self-pay

## 2020-02-22 DIAGNOSIS — A419 Sepsis, unspecified organism: Secondary | ICD-10-CM

## 2020-02-22 DIAGNOSIS — E872 Acidosis, unspecified: Secondary | ICD-10-CM | POA: Diagnosis present

## 2020-02-22 DIAGNOSIS — A4181 Sepsis due to Enterococcus: Secondary | ICD-10-CM | POA: Diagnosis not present

## 2020-02-22 DIAGNOSIS — Z7401 Bed confinement status: Secondary | ICD-10-CM

## 2020-02-22 DIAGNOSIS — Z20822 Contact with and (suspected) exposure to covid-19: Secondary | ICD-10-CM | POA: Diagnosis present

## 2020-02-22 DIAGNOSIS — Z66 Do not resuscitate: Secondary | ICD-10-CM | POA: Diagnosis present

## 2020-02-22 DIAGNOSIS — G4489 Other headache syndrome: Secondary | ICD-10-CM | POA: Diagnosis present

## 2020-02-22 DIAGNOSIS — Z87891 Personal history of nicotine dependence: Secondary | ICD-10-CM

## 2020-02-22 DIAGNOSIS — Z79899 Other long term (current) drug therapy: Secondary | ICD-10-CM

## 2020-02-22 DIAGNOSIS — R000 Tachycardia, unspecified: Secondary | ICD-10-CM | POA: Diagnosis not present

## 2020-02-22 DIAGNOSIS — Z820 Family history of epilepsy and other diseases of the nervous system: Secondary | ICD-10-CM

## 2020-02-22 DIAGNOSIS — E876 Hypokalemia: Secondary | ICD-10-CM | POA: Diagnosis present

## 2020-02-22 DIAGNOSIS — G8250 Quadriplegia, unspecified: Secondary | ICD-10-CM | POA: Diagnosis present

## 2020-02-22 DIAGNOSIS — B952 Enterococcus as the cause of diseases classified elsewhere: Secondary | ICD-10-CM | POA: Diagnosis present

## 2020-02-22 DIAGNOSIS — G35C1 Active secondary progressive multiple sclerosis: Secondary | ICD-10-CM | POA: Diagnosis present

## 2020-02-22 DIAGNOSIS — F329 Major depressive disorder, single episode, unspecified: Secondary | ICD-10-CM | POA: Diagnosis present

## 2020-02-22 DIAGNOSIS — R52 Pain, unspecified: Secondary | ICD-10-CM | POA: Diagnosis not present

## 2020-02-22 DIAGNOSIS — L988 Other specified disorders of the skin and subcutaneous tissue: Secondary | ICD-10-CM | POA: Diagnosis present

## 2020-02-22 DIAGNOSIS — R1310 Dysphagia, unspecified: Secondary | ICD-10-CM | POA: Diagnosis present

## 2020-02-22 DIAGNOSIS — E875 Hyperkalemia: Secondary | ICD-10-CM | POA: Diagnosis present

## 2020-02-22 DIAGNOSIS — D849 Immunodeficiency, unspecified: Secondary | ICD-10-CM | POA: Diagnosis present

## 2020-02-22 DIAGNOSIS — R0902 Hypoxemia: Secondary | ICD-10-CM | POA: Diagnosis not present

## 2020-02-22 DIAGNOSIS — G35 Multiple sclerosis: Secondary | ICD-10-CM | POA: Diagnosis present

## 2020-02-22 DIAGNOSIS — J301 Allergic rhinitis due to pollen: Secondary | ICD-10-CM | POA: Diagnosis present

## 2020-02-22 DIAGNOSIS — N390 Urinary tract infection, site not specified: Secondary | ICD-10-CM | POA: Diagnosis present

## 2020-02-22 DIAGNOSIS — N4889 Other specified disorders of penis: Secondary | ICD-10-CM | POA: Diagnosis present

## 2020-02-22 DIAGNOSIS — M62838 Other muscle spasm: Secondary | ICD-10-CM | POA: Diagnosis present

## 2020-02-22 DIAGNOSIS — J9601 Acute respiratory failure with hypoxia: Secondary | ICD-10-CM | POA: Diagnosis present

## 2020-02-22 DIAGNOSIS — R531 Weakness: Secondary | ICD-10-CM | POA: Diagnosis not present

## 2020-02-22 DIAGNOSIS — R339 Retention of urine, unspecified: Secondary | ICD-10-CM | POA: Diagnosis present

## 2020-02-22 DIAGNOSIS — F482 Pseudobulbar affect: Secondary | ICD-10-CM | POA: Diagnosis present

## 2020-02-22 DIAGNOSIS — R110 Nausea: Secondary | ICD-10-CM | POA: Diagnosis not present

## 2020-02-22 DIAGNOSIS — N319 Neuromuscular dysfunction of bladder, unspecified: Secondary | ICD-10-CM | POA: Diagnosis present

## 2020-02-22 NOTE — ED Triage Notes (Signed)
Pt bib gems from home has hx of MS. Seen at PCP today, prescribed Cipro for UTI, has not started taking it yet. Pt baseline is bilateral lower extremetity paralysis, can usually move both arms but has been having spasms in both arms, neck, and back.

## 2020-02-22 NOTE — ED Provider Notes (Signed)
[facility] EMERGENCY DEPARTMENT Provider Note   CSN: [id number] Arrival date & time: 02/22/20  2318     History Chief Complaint  Patient presents with  . Spasms    KEYLAN COSTABILE is a 59 y.o. male.  Patient presents to the emergency department for evaluation of generalized muscle pain.  Patient has a history of MS, reportedly started having severe pain in his back and arms less than an hour ago.  Patient has associated nausea, no vomiting.  He was brought to the emergency department by EMS.  Patient received IV fentanyl and Zofran during transport with only some relief.  He does report history of spasm secondary to his MS, but this pain is worse.        Past Medical History:  Diagnosis Date  . Abnormality of gait 11/21/2015  . Asthma    childhood asthma  . Classic migraine   . Depression   . Dysphagia   . Hay fever   . Headache syndrome 12/22/2018  . MS (multiple sclerosis) (HCC)   . 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 Headache syndrome 12/22/2018  . 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 Abnormality of gait 11/21/2015  . Transient alteration of awareness 11/21/2015  . Meralgia paresthetica, tx by Dr. Sandria Manly 09/21/2012  . Neurogenic bladder 09/21/2012  . Multiple sclerosis, secondary progressive (HCC) 08/04/2012    Past Surgical History:  Procedure Laterality Date  . eye surgeries     x 2; bilateral 74 and 35       Family History  Problem Relation Age of Onset  . Cancer Father   . Multiple sclerosis Sister   . Seizures Maternal Uncle   . Parkinsonism Maternal Uncle   . Multiple sclerosis Sister   . Multiple sclerosis Paternal Uncle   . Multiple sclerosis Other   . Lung cancer Unknown        parent  . Uterine cancer Unknown        other    Social History   Tobacco Use  . Smoking status: Former Games developer  . Smokeless tobacco: Never Used   Substance Use Topics  . Alcohol use: No    Comment: rare   . Drug use: No    Home Medications Prior to Admission medications   Medication Sig Start Date End Date Taking? Authorizing Provider  albuterol (PROVENTIL HFA;VENTOLIN HFA) 108 (90 Base) MCG/ACT inhaler Inhale 2 puffs into the lungs every 6 (six) hours as needed. 07/19/18   Terressa Koyanagi, DO  baclofen (LIORESAL) 10 MG tablet 1/2 tablet in the morning and at noon, and 2 tablets at night 05/20/16   York Spaniel, MD  citalopram (CELEXA) 40 MG tablet Take 1 tablet (40 mg total) by mouth daily. 08/15/14   York Spaniel, MD  Dextromethorphan-Quinidine 20-10 MG CAPS Take 1 tablet by mouth 2 (two) times daily. 05/27/17   York Spaniel, MD  naproxen sodium (ANAPROX) 220 MG tablet Take 220 mg by mouth 2 (two) times daily with a meal.    [provider]  ocrelizumab 600 mg in sodium chloride 0.9 % 500 mL Inject 600 mg into the vein every 6 (six) months.     [provider]  tamsulosin (FLOMAX) 0.4 MG CAPS capsule Take 0.8 mg by mouth daily. 12/22/19   [provider]    Allergies    Patient has no known allergies.  Review of Systems   Review of Systems  Gastrointestinal: Positive for nausea.  Musculoskeletal: Positive for back pain and myalgias.  All other systems reviewed and are negative.   Physical Exam Updated Vital Signs BP 124/90 (BP Location: Left Arm)   Pulse (!) 104   Temp 99.6 F (37.6 C) (Oral)   Resp 18   Ht 5\' 10"  (1.778 m)   Wt 93 kg   SpO2 91%   BMI 29.42 kg/m   Physical Exam Vitals and nursing note reviewed.  Constitutional:      General: He is not in acute distress.    Appearance: Normal appearance. He is well-developed.  HENT:     Head: Normocephalic and atraumatic.     Right Ear: Hearing normal.     Left Ear: Hearing normal.     Nose: Nose normal.  Eyes:     Conjunctiva/sclera: Conjunctivae normal.     Pupils: Pupils are equal, round, and reactive to light.   Cardiovascular:     Rate and Rhythm: Regular rhythm.     Heart sounds: S1 normal and S2 normal. No murmur. No friction rub. No gallop.   Pulmonary:     Effort: Pulmonary effort is normal. No respiratory distress.     Breath sounds: Normal breath sounds.  Chest:     Chest wall: No tenderness.  Abdominal:     General: Bowel sounds are normal.     Palpations: Abdomen is soft.     Tenderness: There is no abdominal tenderness. There is no guarding or rebound. Negative signs include Murphy's sign and McBurney's sign.     Hernia: No hernia is present.  Musculoskeletal:     Cervical back: Normal range of motion and neck supple.     Comments: Spastic quadriparesis  Skin:    General: Skin is warm and dry.     Findings: No rash.  Neurological:     Mental Status: He is alert and oriented to person, place, and time.     GCS: GCS eye subscore is 4. GCS verbal subscore is 5. GCS motor subscore is 6.     Cranial Nerves: No cranial nerve deficit.     Sensory: No sensory deficit.     Coordination: Coordination normal.  Psychiatric:        Speech: Speech normal.        Behavior: Behavior normal.        Thought Content: Thought content normal.     ED Results / Procedures / Treatments   Labs (all labs ordered are listed, but only abnormal results are displayed) Labs Reviewed  CBC WITH DIFFERENTIAL/PLATELET    EKG None  Radiology No results found.  Procedures Procedures (including critical care time)  Medications Ordered in ED Medications - No data to display  ED Course  I have reviewed the triage vital signs and the nursing notes.  Pertinent labs & imaging results that were available during my care of the patient were reviewed by me and considered in my medical decision making (see chart for details).    MDM Rules/Calculators/A&P                      Patient presents to the emergency department for evaluation of pain in his back and upper extremities.  Patient has a history  of MS.  He reports that he sometimes gets spasms in these areas secondary to the MS.  He had a particularly severe pain tonight that caused him to  call EMS.  During transport he received fentanyl for the pain and Zofran for associated nausea and had some improvement.  At arrival, however, he was noted to be febrile.  This is a complex patients.  He does have a chronic indwelling Foley catheter.  Reviewing his records reveals that he has visited urgent care multiple times over the last few weeks and has been treated with 3 different antibiotics for urinary tract infection.  Records obtained from urgent care visit include a urine culture that showed Enterococcus.  Current antibiotic regimen would not cover the Enterococcus.  Patient normotensive with a very slight elevated lactic acid at 2.2.  No evidence of severe sepsis or septic shock.  Patient given a liter of fluid and ampicillin to cover for Enterococcus urinary tract infection.  He will be admitted to the hospital for further management.  CRITICAL CARE Performed by: Gilda Crease   Total critical care time: 30 minutes  Critical care time was exclusive of separately billable procedures and treating other patients.  Critical care was necessary to treat or prevent imminent or life-threatening deterioration.  Critical care was time spent personally by me on the following activities: development of treatment plan with patient and/or surrogate as well as nursing, discussions with consultants, evaluation of patient's response to treatment, examination of patient, obtaining history from patient or surrogate, ordering and performing treatments and interventions, ordering and review of laboratory studies, ordering and review of radiographic studies, pulse oximetry and re-evaluation of patient's condition.   Final Clinical Impression(s) / ED Diagnoses Final diagnoses:  None    Rx / DC Orders ED Discharge Orders    None       Kevonta Phariss,  Canary Brim, MD 02/23/20 [phone]

## 2020-02-23 ENCOUNTER — Encounter (HOSPITAL_COMMUNITY): Payer: Self-pay | Admitting: Internal Medicine

## 2020-02-23 ENCOUNTER — Inpatient Hospital Stay (HOSPITAL_COMMUNITY): Payer: No Typology Code available for payment source

## 2020-02-23 ENCOUNTER — Emergency Department (HOSPITAL_COMMUNITY): Payer: No Typology Code available for payment source

## 2020-02-23 DIAGNOSIS — G35 Multiple sclerosis: Secondary | ICD-10-CM

## 2020-02-23 DIAGNOSIS — Z66 Do not resuscitate: Secondary | ICD-10-CM | POA: Diagnosis present

## 2020-02-23 DIAGNOSIS — Z20822 Contact with and (suspected) exposure to covid-19: Secondary | ICD-10-CM | POA: Diagnosis present

## 2020-02-23 DIAGNOSIS — R1310 Dysphagia, unspecified: Secondary | ICD-10-CM | POA: Diagnosis present

## 2020-02-23 DIAGNOSIS — R339 Retention of urine, unspecified: Secondary | ICD-10-CM | POA: Diagnosis present

## 2020-02-23 DIAGNOSIS — E876 Hypokalemia: Secondary | ICD-10-CM | POA: Diagnosis present

## 2020-02-23 DIAGNOSIS — D849 Immunodeficiency, unspecified: Secondary | ICD-10-CM | POA: Diagnosis present

## 2020-02-23 DIAGNOSIS — Z87891 Personal history of nicotine dependence: Secondary | ICD-10-CM | POA: Diagnosis not present

## 2020-02-23 DIAGNOSIS — Z7401 Bed confinement status: Secondary | ICD-10-CM | POA: Diagnosis not present

## 2020-02-23 DIAGNOSIS — F329 Major depressive disorder, single episode, unspecified: Secondary | ICD-10-CM | POA: Diagnosis present

## 2020-02-23 DIAGNOSIS — G8250 Quadriplegia, unspecified: Secondary | ICD-10-CM

## 2020-02-23 DIAGNOSIS — N390 Urinary tract infection, site not specified: Secondary | ICD-10-CM

## 2020-02-23 DIAGNOSIS — E872 Acidosis, unspecified: Secondary | ICD-10-CM | POA: Diagnosis present

## 2020-02-23 DIAGNOSIS — J301 Allergic rhinitis due to pollen: Secondary | ICD-10-CM | POA: Diagnosis present

## 2020-02-23 DIAGNOSIS — L988 Other specified disorders of the skin and subcutaneous tissue: Secondary | ICD-10-CM | POA: Diagnosis present

## 2020-02-23 DIAGNOSIS — F482 Pseudobulbar affect: Secondary | ICD-10-CM | POA: Diagnosis present

## 2020-02-23 DIAGNOSIS — A4181 Sepsis due to Enterococcus: Secondary | ICD-10-CM | POA: Diagnosis present

## 2020-02-23 DIAGNOSIS — J9601 Acute respiratory failure with hypoxia: Secondary | ICD-10-CM | POA: Diagnosis not present

## 2020-02-23 DIAGNOSIS — I5021 Acute systolic (congestive) heart failure: Secondary | ICD-10-CM | POA: Diagnosis not present

## 2020-02-23 DIAGNOSIS — E875 Hyperkalemia: Secondary | ICD-10-CM

## 2020-02-23 DIAGNOSIS — G4489 Other headache syndrome: Secondary | ICD-10-CM | POA: Diagnosis present

## 2020-02-23 DIAGNOSIS — N319 Neuromuscular dysfunction of bladder, unspecified: Secondary | ICD-10-CM

## 2020-02-23 DIAGNOSIS — Z820 Family history of epilepsy and other diseases of the nervous system: Secondary | ICD-10-CM | POA: Diagnosis not present

## 2020-02-23 DIAGNOSIS — M62838 Other muscle spasm: Secondary | ICD-10-CM | POA: Diagnosis present

## 2020-02-23 DIAGNOSIS — B952 Enterococcus as the cause of diseases classified elsewhere: Secondary | ICD-10-CM | POA: Diagnosis present

## 2020-02-23 DIAGNOSIS — Z79899 Other long term (current) drug therapy: Secondary | ICD-10-CM | POA: Diagnosis not present

## 2020-02-23 DIAGNOSIS — N4889 Other specified disorders of penis: Secondary | ICD-10-CM | POA: Diagnosis present

## 2020-02-23 LAB — BASIC METABOLIC PANEL
Anion gap: 13 (ref 5–15)
BUN: 20 mg/dL (ref 6–20)
CO2: 22 mmol/L (ref 22–32)
Calcium: 8.9 mg/dL (ref 8.9–10.3)
Chloride: 107 mmol/L (ref 98–111)
Creatinine, Ser: 1.05 mg/dL (ref 0.61–1.24)
GFR calc Af Amer: >60 mL/min (ref >60)
GFR calc non Af Amer: >60 mL/min (ref >60)
Glucose, Bld: 99 mg/dL (ref 70–99)
Potassium: 5.2 mmol/L — ABNORMAL HIGH (ref 3.5–5.1)
Sodium: 142 mmol/L (ref 135–145)

## 2020-02-23 LAB — CBC WITH DIFFERENTIAL/PLATELET
Abs Immature Granulocytes: 0.03 10*3/uL (ref 0.00–0.07)
Abs Immature Granulocytes: 0.15 10*3/uL — ABNORMAL HIGH (ref 0.00–0.07)
Basophils Absolute: 0 10*3/uL (ref 0.0–0.1)
Basophils Absolute: 0 10*3/uL (ref 0.0–0.1)
Basophils Relative: 0 %
Basophils Relative: 0 %
Eosinophils Absolute: 0 10*3/uL (ref 0.0–0.5)
Eosinophils Absolute: 0 10*3/uL (ref 0.0–0.5)
Eosinophils Relative: 0 %
Eosinophils Relative: 0 %
HCT: 43.2 % (ref 39.0–52.0)
HCT: 48.2 % (ref 39.0–52.0)
Hemoglobin: 14 g/dL (ref 13.0–17.0)
Hemoglobin: 15.6 g/dL (ref 13.0–17.0)
Immature Granulocytes: 0 %
Immature Granulocytes: 1 %
Lymphocytes Relative: 1 %
Lymphocytes Relative: 5 %
Lymphs Abs: 0.1 10*3/uL — ABNORMAL LOW (ref 0.7–4.0)
Lymphs Abs: 0.5 10*3/uL — ABNORMAL LOW (ref 0.7–4.0)
MCH: 31 pg (ref 26.0–34.0)
MCH: 31.1 pg (ref 26.0–34.0)
MCHC: 32.4 g/dL (ref 30.0–36.0)
MCHC: 32.4 g/dL (ref 30.0–36.0)
MCV: 95.6 fL (ref 80.0–100.0)
MCV: 96 fL (ref 80.0–100.0)
Monocytes Absolute: 0.1 10*3/uL (ref 0.1–1.0)
Monocytes Absolute: 0.7 10*3/uL (ref 0.1–1.0)
Monocytes Relative: 1 %
Monocytes Relative: 4 %
Neutro Abs: 17.6 10*3/uL — ABNORMAL HIGH (ref 1.7–7.7)
Neutro Abs: 9.8 10*3/uL — ABNORMAL HIGH (ref 1.7–7.7)
Neutrophils Relative %: 94 %
Neutrophils Relative %: 94 %
Platelets: 182 10*3/uL (ref 150–400)
Platelets: 212 10*3/uL (ref 150–400)
RBC: 4.52 MIL/uL (ref 4.22–5.81)
RBC: 5.02 MIL/uL (ref 4.22–5.81)
RDW: 14 % (ref 11.5–15.5)
RDW: 14.2 % (ref 11.5–15.5)
WBC: 10.5 10*3/uL (ref 4.0–10.5)
WBC: 18.7 10*3/uL — ABNORMAL HIGH (ref 4.0–10.5)
nRBC: 0 % (ref 0.0–0.2)
nRBC: 0 % (ref 0.0–0.2)

## 2020-02-23 LAB — COMPREHENSIVE METABOLIC PANEL
ALT: 20 U/L (ref 0–44)
AST: 26 U/L (ref 15–41)
Albumin: 3.4 g/dL — ABNORMAL LOW (ref 3.5–5.0)
Alkaline Phosphatase: 79 U/L (ref 38–126)
Anion gap: 8 (ref 5–15)
BUN: 18 mg/dL (ref 6–20)
CO2: 25 mmol/L (ref 22–32)
Calcium: 8.6 mg/dL — ABNORMAL LOW (ref 8.9–10.3)
Chloride: 109 mmol/L (ref 98–111)
Creatinine, Ser: 1.27 mg/dL — ABNORMAL HIGH (ref 0.61–1.24)
GFR calc Af Amer: >60 mL/min (ref >60)
GFR calc non Af Amer: >60 mL/min (ref >60)
Glucose, Bld: 118 mg/dL — ABNORMAL HIGH (ref 70–99)
Potassium: 3.9 mmol/L (ref 3.5–5.1)
Sodium: 142 mmol/L (ref 135–145)
Total Bilirubin: 1.5 mg/dL — ABNORMAL HIGH (ref 0.3–1.2)
Total Protein: 5.6 g/dL — ABNORMAL LOW (ref 6.5–8.1)

## 2020-02-23 LAB — URINALYSIS, ROUTINE W REFLEX MICROSCOPIC
Bilirubin Urine: NEGATIVE
Glucose, UA: NEGATIVE mg/dL
Ketones, ur: NEGATIVE mg/dL
Nitrite: POSITIVE — AB
Protein, ur: NEGATIVE mg/dL
Specific Gravity, Urine: 1.008 (ref 1.005–1.030)
pH: 8 (ref 5.0–8.0)

## 2020-02-23 LAB — POCT I-STAT 7, (LYTES, BLD GAS, ICA,H+H)
Acid-base deficit: 2 mmol/L (ref 0.0–2.0)
Bicarbonate: 24 mmol/L (ref 20.0–28.0)
Calcium, Ion: 1.21 mmol/L (ref 1.15–1.40)
HCT: 38 % — ABNORMAL LOW (ref 39.0–52.0)
Hemoglobin: 12.9 g/dL — ABNORMAL LOW (ref 13.0–17.0)
O2 Saturation: 83 %
Patient temperature: 99.2
Potassium: 3.4 mmol/L — ABNORMAL LOW (ref 3.5–5.1)
Sodium: 144 mmol/L (ref 135–145)
TCO2: 25 mmol/L (ref 22–32)
pCO2 arterial: 43.9 mmHg (ref 32.0–48.0)
pH, Arterial: 7.346 — ABNORMAL LOW (ref 7.350–7.450)
pO2, Arterial: 52 mmHg — ABNORMAL LOW (ref 83.0–108.0)

## 2020-02-23 LAB — LACTIC ACID, PLASMA
Lactic Acid, Venous: 2.2 mmol/L (ref 0.5–1.9)
Lactic Acid, Venous: 2.1 mmol/L (ref 0.5–1.9)

## 2020-02-23 LAB — APTT: aPTT: 33 seconds (ref 24–36)

## 2020-02-23 LAB — TROPONIN I (HIGH SENSITIVITY)
Troponin I (High Sensitivity): 3 ng/L (ref <18)
Troponin I (High Sensitivity): 5 ng/L (ref <18)

## 2020-02-23 LAB — PROTIME-INR
INR: 1.1 (ref 0.8–1.2)
Prothrombin Time: 14.2 seconds (ref 11.4–15.2)

## 2020-02-23 LAB — HIV ANTIBODY (ROUTINE TESTING W REFLEX): HIV Screen 4th Generation wRfx: NONREACTIVE

## 2020-02-23 LAB — CORTISOL: Cortisol, Plasma: 25 ug/dL

## 2020-02-23 LAB — PROCALCITONIN: Procalcitonin: 38.33 ng/mL

## 2020-02-23 LAB — SARS CORONAVIRUS 2 BY RT PCR (HOSPITAL ORDER, PERFORMED IN ~~LOC~~ HOSPITAL LAB): SARS Coronavirus 2: NEGATIVE

## 2020-02-23 LAB — CK: Total CK: 86 U/L (ref 49–397)

## 2020-02-23 MED ORDER — DEXTROMETHORPHAN-QUINIDINE 20-10 MG PO CAPS
1.0000 | ORAL_CAPSULE | Freq: Two times a day (BID) | ORAL | Status: DC
  Administered 2020-02-24 – 2020-02-27 (×7): 1 via ORAL
  Filled 2020-02-23 (×11): qty 1

## 2020-02-23 MED ORDER — ENOXAPARIN SODIUM 40 MG/0.4ML ~~LOC~~ SOLN
40.0000 mg | SUBCUTANEOUS | Status: DC
  Administered 2020-02-23 – 2020-02-26 (×4): 40 mg via SUBCUTANEOUS
  Filled 2020-02-23 (×5): qty 0.4

## 2020-02-23 MED ORDER — CITALOPRAM HYDROBROMIDE 40 MG PO TABS
40.0000 mg | ORAL_TABLET | Freq: Every day | ORAL | Status: DC
  Administered 2020-02-23 – 2020-02-27 (×5): 40 mg via ORAL
  Filled 2020-02-23 (×3): qty 1
  Filled 2020-02-23: qty 4
  Filled 2020-02-23: qty 1

## 2020-02-23 MED ORDER — LACTATED RINGERS IV SOLN
INTRAVENOUS | Status: AC
  Administered 2020-02-23: 125 mL/h via INTRAVENOUS

## 2020-02-23 MED ORDER — IOHEXOL 350 MG/ML SOLN
100.0000 mL | Freq: Once | INTRAVENOUS | Status: AC | PRN
  Administered 2020-02-23: 100 mL via INTRAVENOUS

## 2020-02-23 MED ORDER — ONDANSETRON HCL 4 MG PO TABS: 4.0000 mg | ORAL_TABLET | Freq: Four times a day (QID) | ORAL | Status: DC | PRN

## 2020-02-23 MED ORDER — VANCOMYCIN HCL 2000 MG/400ML IV SOLN
2000.0000 mg | Freq: Once | INTRAVENOUS | Status: AC
  Administered 2020-02-23: 2000 mg via INTRAVENOUS
  Filled 2020-02-23: qty 400

## 2020-02-23 MED ORDER — POLYETHYLENE GLYCOL 3350 17 G PO PACK: 17.0000 g | PACK | Freq: Every day | ORAL | Status: DC | PRN

## 2020-02-23 MED ORDER — ACETAMINOPHEN 325 MG PO TABS
650.0000 mg | ORAL_TABLET | Freq: Four times a day (QID) | ORAL | Status: DC | PRN
  Administered 2020-02-23 – 2020-02-25 (×3): 650 mg via ORAL
  Filled 2020-02-23 (×5): qty 2

## 2020-02-23 MED ORDER — MORPHINE SULFATE (PF) 4 MG/ML IV SOLN
4.0000 mg | Freq: Once | INTRAVENOUS | Status: AC
  Administered 2020-02-23: 4 mg via INTRAVENOUS
  Filled 2020-02-23: qty 1

## 2020-02-23 MED ORDER — VANCOMYCIN HCL IN DEXTROSE 1-5 GM/200ML-% IV SOLN
1000.0000 mg | Freq: Two times a day (BID) | INTRAVENOUS | Status: DC
  Administered 2020-02-23 – 2020-02-25 (×5): 1000 mg via INTRAVENOUS
  Filled 2020-02-23 (×7): qty 200

## 2020-02-23 MED ORDER — ONDANSETRON HCL 4 MG/2ML IJ SOLN: 4.0000 mg | Freq: Four times a day (QID) | INTRAMUSCULAR | Status: DC | PRN

## 2020-02-23 MED ORDER — ALBUTEROL SULFATE HFA 108 (90 BASE) MCG/ACT IN AERS
2.0000 | INHALATION_SPRAY | Freq: Four times a day (QID) | RESPIRATORY_TRACT | Status: DC | PRN
  Filled 2020-02-23: qty 6.7

## 2020-02-23 MED ORDER — ACETAMINOPHEN 650 MG RE SUPP: 650.0000 mg | Freq: Four times a day (QID) | RECTAL | Status: DC | PRN

## 2020-02-23 MED ORDER — TAMSULOSIN HCL 0.4 MG PO CAPS
0.8000 mg | ORAL_CAPSULE | Freq: Every day | ORAL | Status: DC
  Administered 2020-02-24 – 2020-02-27 (×4): 0.8 mg via ORAL
  Filled 2020-02-23 (×5): qty 2

## 2020-02-23 MED ORDER — SODIUM CHLORIDE 0.9 % IV SOLN
1.0000 g | INTRAVENOUS | Status: DC
  Administered 2020-02-23 – 2020-02-26 (×4): 1 g via INTRAVENOUS
  Filled 2020-02-23 (×4): qty 10

## 2020-02-23 MED ORDER — LACTATED RINGERS IV BOLUS
2000.0000 mL | Freq: Once | INTRAVENOUS | Status: AC
  Administered 2020-02-23: 2000 mL via INTRAVENOUS

## 2020-02-23 MED ORDER — BACLOFEN 20 MG PO TABS
20.0000 mg | ORAL_TABLET | Freq: Every day | ORAL | Status: DC
  Administered 2020-02-23 – 2020-02-26 (×4): 20 mg via ORAL
  Filled 2020-02-23 (×5): qty 1

## 2020-02-23 MED ORDER — CHLORHEXIDINE GLUCONATE CLOTH 2 % EX PADS
6.0000 | MEDICATED_PAD | Freq: Every day | CUTANEOUS | Status: DC
  Administered 2020-02-26 – 2020-02-27 (×2): 6 via TOPICAL

## 2020-02-23 MED ORDER — LACTATED RINGERS IV BOLUS
1000.0000 mL | Freq: Once | INTRAVENOUS | Status: AC
  Administered 2020-02-23: 1000 mL via INTRAVENOUS

## 2020-02-23 MED ORDER — BACLOFEN 10 MG PO TABS
5.0000 mg | ORAL_TABLET | Freq: Two times a day (BID) | ORAL | Status: DC
  Administered 2020-02-24 – 2020-02-27 (×8): 5 mg via ORAL
  Filled 2020-02-23 (×8): qty 1

## 2020-02-23 MED ORDER — MORPHINE SULFATE 10 MG/5ML PO SOLN
5.0000 mg | ORAL | Status: DC | PRN
  Administered 2020-02-23 – 2020-02-27 (×11): 5 mg via ORAL
  Filled 2020-02-23 (×13): qty 4

## 2020-02-23 MED ORDER — SODIUM CHLORIDE 0.9 % IV SOLN
2.0000 g | Freq: Four times a day (QID) | INTRAVENOUS | Status: DC
  Administered 2020-02-23: 2 g via INTRAVENOUS
  Filled 2020-02-23: qty 2000
  Filled 2020-02-23: qty 2
  Filled 2020-02-23: qty 2000

## 2020-02-23 NOTE — H&P (Signed)
History and Physical    [name] FYB:[id number] DOB: [birth year] DOA: 02/22/2020  [facility]  Patient coming from:    Chief Complaint:  Chief Complaint  Patient presents with  . Spasms     HPI:  59 year old male with past medical history of secondary progressive multiple sclerosis, pseudobulbar affect, spastic quadriparesis, neurogenic bladder, asthma and depression who presents to [facility] emergency department due to progressively worsening generalized weakness.  Patient is a poor historian due to lethargy and therefore the majority the history has been obtained from the wife who is at the bedside.  According to the wife, in early April the patient began to attempt to self catheterize himself due to progressive difficulty with emptying his bladder due to his multiple sclerosis.  While this trial of self-catheterization was short-lived, it was around this time that the patient began to experience dysuria and increased frequency of urination.  Patient presented to an urgent care clinic on 4/8 where he was thought to be suffering from a urinary tract infection and started on Bactrim.  Patient did transiently feel better after completion of Bactrim but then began to experience a recurrence of symptoms.  Patient once again presented to the same urgent care clinic on 4/22 and at that point the patient was started on a course of nitrofurantoin.  Again, the patient transiently began to feel better but after several days began to develop progressively worsening generalized weakness.  This generalized weakness was associated with increasing frequency of muscle spasms and recurrent dysuria with frequent bouts of urination.  Patient complains of associated nausea but no vomiting.  Patient presented to his urologist Dr. Hinton Rao on 5/13 where it was recommended that a Foley catheter should be placed.  The patient was also prescribed a course of ciprofloxacin for  suspected persisting urinary tract infection.  Unfortunately, the patient never started his ciprofloxacin.  Due to continued severe muscle spasms and generalized weakness the patient presented to [facility] emergency department via EMS for evaluation.  Upon evaluation in the emergency department, patient was found to have multiple SIRS criteria including tachypnea, tachycardia and fever with associated lactic acidosis concerning for sepsis.  Patient was hydrated with 1 L of isotonic fluids followed by initiation of intravenous ampicillin for Enterococcus faecalis that grew out of a urine culture on 4/26.  The hospitalist group was then called to assess the patient for admission the hospital.   Review of Systems: A 10-system review of systems has been performed and all systems are negative with the exception of what is listed in the HPI.   Past Medical History:  Diagnosis Date  . Abnormality of gait 11/21/2015  . Asthma    childhood asthma  . Classic migraine   . Depression   . Dysphagia   . Hay fever   . Headache syndrome 12/22/2018  . MS (multiple sclerosis) (HCC)   . 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 eye surgeries     x 2; bilateral 74 and 76     reports that he has quit smoking. He has never used smokeless tobacco. He reports that he does not drink alcohol or use drugs.  No Known Allergies  Family History  Problem Relation Age of Onset  . Cancer Father   . Multiple sclerosis Sister   . Seizures Maternal Uncle   . Parkinsonism Maternal Uncle   . Multiple sclerosis Sister   . Multiple sclerosis Paternal  Uncle   . Multiple sclerosis Other   . Lung cancer Other        parent  . Uterine cancer Other        other     Prior to Admission medications   Medication Sig Start Date End Date Taking? Authorizing Provider  albuterol (PROVENTIL HFA;VENTOLIN HFA) 108 (90 Base) MCG/ACT inhaler Inhale 2 puffs into the lungs  every 6 (six) hours as needed. Patient taking differently: Inhale 2 puffs into the lungs every 6 (six) hours as needed for wheezing or shortness of breath.  07/19/18  Yes Terressa Koyanagi, DO  baclofen (LIORESAL) 10 MG tablet 1/2 tablet in the morning and at noon, and 2 tablets at night Patient taking differently: Take 2.5-10 mg by mouth See admin instructions. Take 1/2 tablet in the morning and at noon, and take 2 tablets at night 05/20/16  Yes York Spaniel, MD  citalopram (CELEXA) 40 MG tablet Take 1 tablet (40 mg total) by mouth daily. 08/15/14  Yes York Spaniel, MD  Dextromethorphan-Quinidine 20-10 MG CAPS Take 1 tablet by mouth 2 (two) times daily. 05/27/17  Yes York Spaniel, MD  ocrelizumab 600 mg in sodium chloride 0.9 % 500 mL Inject 600 mg into the vein every 6 (six) months.    Yes [provider]  tamsulosin (FLOMAX) 0.4 MG CAPS capsule Take 0.8 mg by mouth daily. 12/22/19  Yes [provider]    Physical Exam: Vitals:   02/23/20 0445 02/23/20 0500 02/23/20 0515 02/23/20 0530  BP: (!) 88/63 99/65 94/60  94/65  Pulse: (!) 109 (!) 111 (!) 111 (!) 103  Resp: (!) 21 (!) 21 (!) 23 (!) 23  Temp:      TempSrc:      SpO2: 90% 91% 93% 93%  Weight:      Height:        Constitutional: Lethargic but arousable and oriented x3, no associated distress.   Skin: no rashes, no lesions, poor skin turgor noted. Eyes: Pupils are equally reactive to light.  No evidence of scleral icterus or conjunctival pallor.  ENMT: Dry mucous membranes noted posterior pharynx clear of any exudate or lesions.   Neck: normal, supple, no masses, no thyromegaly.  No evidence of jugular venous distension.   Respiratory: clear to auscultation bilaterally, no wheezing, no crackles. Normal respiratory effort. No accessory muscle use.  Cardiovascular: Tachycardic but regular, no murmurs / rubs / gallops. No extremity edema. 2+ pedal pulses. No carotid bruits.  Chest:   Nontender without crepitus or  deformity.   Back:   Nontender without crepitus or deformity. Abdomen: Protuberant but soft and nontender.  No evidence of intra-abdominal masses.  Positive bowel sounds noted in all quadrants.   Musculoskeletal: Poor muscle tone noted.  No notable tenderness, no contractures observed.   Neurologic: Patient exhibits extremely limited strength, approximately 2 out of 5 of the bilateral upper extremities.  Strength is greater in the left upper extremity compared to the right.  Sensation is markedly diminished from the level of the navel down to the feet.  Patient is lethargic but arousable and oriented x3.  Patient is following all commands and is responsive to verbal stimuli. Psychiatric: Patient presents as a depressed mood with flat affect.  Patient seems to possess insight as to theircurrent situation.     Labs on Admission: I have personally reviewed following labs and imaging studies -   CBC: Recent Labs  Lab 02/22/20 2333  WBC 10.5  NEUTROABS  9.8*  HGB 15.6  HCT 48.2  MCV 96.0  PLT 846   Basic Metabolic Panel: Recent Labs  Lab 02/22/20 2333  NA 142  K 5.2*  CL 107  CO2 22  GLUCOSE 99  BUN 20  CREATININE 1.05  CALCIUM 8.9   GFR: Estimated Creatinine Clearance: 90 mL/min (by C-G formula based on SCr of 1.05 mg/dL). Liver Function Tests: No results for input(s): AST, ALT, ALKPHOS, BILITOT, PROT, ALBUMIN in the last 168 hours. No results for input(s): LIPASE, AMYLASE in the last 168 hours. No results for input(s): AMMONIA in the last 168 hours. Coagulation Profile: No results for input(s): INR, PROTIME in the last 168 hours. Cardiac Enzymes: Recent Labs  Lab 02/22/20 2333  CKTOTAL 86   BNP (last 3 results) No results for input(s): PROBNP in the last 8760 hours. HbA1C: No results for input(s): HGBA1C in the last 72 hours. CBG: No results for input(s): GLUCAP in the last 168 hours. Lipid Profile: No results for input(s): CHOL, HDL, LDLCALC, TRIG, CHOLHDL,  LDLDIRECT in the last 72 hours. Thyroid Function Tests: No results for input(s): TSH, T4TOTAL, FREET4, T3FREE, THYROIDAB in the last 72 hours. Anemia Panel: No results for input(s): VITAMINB12, FOLATE, FERRITIN, TIBC, IRON, RETICCTPCT in the last 72 hours. Urine analysis:    Component Value Date/Time   COLORURINE YELLOW 02/22/2020 2333   APPEARANCEUR HAZY (A) 02/22/2020 2333   LABSPEC 1.008 02/22/2020 2333   PHURINE 8.0 02/22/2020 2333   GLUCOSEU NEGATIVE 02/22/2020 2333   HGBUR MODERATE (A) 02/22/2020 2333   BILIRUBINUR NEGATIVE 02/22/2020 2333   BILIRUBINUR neg 09/12/2018 1534   KETONESUR NEGATIVE 02/22/2020 2333   PROTEINUR NEGATIVE 02/22/2020 2333   UROBILINOGEN 0.2 09/12/2018 1534   NITRITE POSITIVE (A) 02/22/2020 2333   LEUKOCYTESUR MODERATE (A) 02/22/2020 2333    Radiological Exams on Admission - Personally Reviewed: DG Chest Port 1 View  Result Date: 02/23/2020 CLINICAL DATA:  Sepsis EXAM: PORTABLE CHEST 1 VIEW COMPARISON:  07/19/2018 FINDINGS: Single frontal view of the chest demonstrates a stable cardiac silhouette. The lung volumes are diminished with subsegmental atelectasis or scarring at the lung bases. No airspace disease, effusion, or pneumothorax. No acute bony abnormalities. IMPRESSION: 1. Low lung volumes, no acute process. Electronically Signed   By: Randa Ngo M.D.   On: 02/23/2020 01:44    EKG: Personally reviewed.  Rhythm is sinus tachycardia with heart rate of 122 bpm.  No dynamic ST segment changes appreciated.  Assessment/Plan Principal Problem:   Sepsis due to Enterococcus [facility])   Patient presenting with 1 month history of progressively worsening generalized weakness, lower abdominal discomfort, frequent urination and painful spasms secondary to suspected complicated urinary tract infection  Multiple SIRS criteria including tachycardia and fever with concurrent lactic acidosis and acute hypoxic respiratory failure are all concerning for  sepsis  Urine cultures obtained on 4/26 are growing out Enterococcus faecalis which is the suspected primary causative source/organism on this presentation  Considering patient's severity of presentation with multiple sirs criteria, lactic acidosis and bouts of hypotension in the emergency department and placed the patient on broad-spectrum intravenous antibiotic therapy with vancomycin to cover for Enterococcus faecalis in addition to ceftriaxone  Concern for immunocompromised state due to Ocrevus infusions  Obtaining CT imaging of the abdomen and pelvis to identify for any evidence of pyelonephritis, perinephric abscess or infected stone.  Additionally obtaining CT angiogram of the chest considering patient is additionally presenting with acute hypoxic respiratory failure as confirmed via ABG  Hydrating patient aggressively  with intravenous isotonic fluids with 30 cc/kg of lactated Ringer's followed by continuous infusion  Obtaining blood and urine cultures  Close clinical monitoring.  Active Problems:   Multiple sclerosis, secondary progressive (HCC)  Longstanding history of secondary progressive multiple sclerosis diagnosed in 2007 following with [facility] neurology  Receiving Ocrevus infusions which creates an immunocompromised state    Lactic acidosis  Lactic acidosis secondary to infection and volume depletion  Hydration aggressively with intravenous isotonic fluids, treating patient with broad-spectrum intravenous antibiotic therapy  Monitor lactic acid levels to ensure downtrending and resolution    Enterococcus UTI   Please see assessment and plan above.    Neurogenic bladder  Foley catheter placed at urology on 5/13  Foley catheter had to be exchanged on arrival due to concerns for penile bleeding  Urology outpatient follow-up    Spastic quadriparesis [facility])   Supportive care  Frequent turns    Hyperkalemia   Mild hyperkalemia  Monitoring on  telemetry  Monitoring potassium with serial chemistries    Penile bleeding  Penile bleeding secondary to Foley catheter placement urology on 5/13.  The catheter has been exchanged upon arrival to the emergency department.  There is no evidence of gross hematuria or blood clots in the urine despite penile soft tissue bleeding.  Bleeding is likely self-limiting, will monitor.  Code Status:  DNR Family Communication: Wife is at the bedside and has been updated on plan of care.  Status is: Inpatient  Remains inpatient appropriate because:Hemodynamically unstable, Ongoing diagnostic testing needed not appropriate for outpatient work up, IV treatments appropriate due to intensity of illness or inability to take PO and Inpatient level of care appropriate due to severity of illness   Dispo: The patient is from: Home              Anticipated d/c is to: Home              Anticipated d/c date is: > 3 days              Patient currently is not medically stable to d/c.         Marinda Elk MD [facility] Pager [phone]  If 7PM-7AM, please contact night-coverage [email or web address] Use universal [place] password for that web site. If you do not have the password, please call the hospital operator.  02/23/2020, 5:33 AM

## 2020-02-23 NOTE — Evaluation (Signed)
Clinical/Bedside Swallow Evaluation Patient Details  Name: [name] MRN: [id number] Date of Birth: 11-23-1960  Today's Date: 02/23/2020 Time: SLP Start Time (ACUTE ONLY): 8242 SLP Stop Time (ACUTE ONLY): 1422 SLP Time Calculation (min) (ACUTE ONLY): 29 min  Past Medical History:  Past Medical History:  Diagnosis Date  . Abnormality of gait 11/21/2015  . Asthma    childhood asthma  . Classic migraine   . Depression   . Dysphagia   . Hay fever   . Headache syndrome 12/22/2018  . MS (multiple sclerosis) ([place])   . 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 eye surgeries     x 2; bilateral 34 and 95   HPI:  59 year old male with PMH of secondary progressive multiple sclerosis (diagnosed 2007), pseudobulbar affect, spastic quadriparesis, neurogenic bladder, asthma and depression presented to [facility] emergency department due to progressively worsening generalized weakness. Dx included lactic acidosis, UTI.  Pt participated in OP SLP 08/2017-11/2017 for treatment of dysarthria and dysphagia.  MBS in 08/03/14 revealed functional swallow with mild delays in mastication, good pharyngeal clearance and no aspiration; however, at the time pt described fatigue impacting function during meals.  Per pt and wife, pt's swallow has been functional.  He is able to eat a range of foods without difficulty.  He eats with certain precautions given his hx of dysphagia, but has been managing well without concerns for aspiration.    Assessment / Plan / Recommendation Clinical Impression  Pt presents with functional oropharyngeal swallow with adequate mastication, brisk swallow response, and no coughing when consuming controlled individual sips of liquid.  Larger, successive boluses of water elicited a cough, but pt explained that he intentionally does NOT drink in that pattern due to hx of dysphagia.  We discussed the nature of  dysphagia and the tendency for patients to decompensate when there is an acute medical crisis. Pt is on clear liquids for now.  He may be advanced back to regular, thin liquids when medically appropriate.  Pt's cognition is back to baseline - he is able to self-monitor well.  Recommended that he eat/drink with extra caution over the next few days; if he has recurring difficulty, he and his wife agree to let nursing know and SLP can return to see him this weekend.  Otherwise, SLP will f/u next week to ensure toleration/safety with diet.  Do not anticipate there will be any need for f/u SLP at D/C.  Pt/wife agree with plan.  SLP Visit Diagnosis: Dysphagia, unspecified (R13.10)    Aspiration Risk  Mild aspiration risk    Diet Recommendation   thin liquids per MD; advance to regular per MD when appropriate  Medication Administration: Whole meds with liquid    Other  Recommendations Oral Care Recommendations: Oral care BID   Follow up Recommendations None      Frequency and Duration min 1 x/week  1 week       Prognosis Prognosis for Safe Diet Advancement: Good      Swallow Study   General Date of Onset: 02/23/20 HPI: 59 year old male with PMH of secondary progressive multiple sclerosis (diagnosed 2007), pseudobulbar affect, spastic quadriparesis, neurogenic bladder, asthma and depression presented to [facility] emergency department due to progressively worsening generalized weakness. Dx included lactic acidosis, UTI.  Pt participated in OP SLP 08/2017-11/2017 for treatment of dysarthria and dysphagia.  MBS in 08/03/14 revealed functional swallow with mild delays in mastication,  good pharyngeal clearance and no aspiration; however, at the time pt described fatigue impacting function during meals.  Per pt and wife, pt's swallow has been functional.  He is able to eat a range of foods without difficulty.  He eats with certain precautions given his hx of dysphagia, but has been managing well  without concerns for aspiration.  Type of Study: Bedside Swallow Evaluation Previous Swallow Assessment: see HPI Diet Prior to this Study: Thin liquids Temperature Spikes Noted: No History of Recent Intubation: No Behavior/Cognition: Alert;Cooperative;Pleasant mood Oral Cavity Assessment: Within Functional Limits Oral Care Completed by SLP: No Oral Cavity - Dentition: Adequate natural dentition Vision: Functional for self-feeding Self-Feeding Abilities: Needs assist(uses LUE, usually I at baseline) Patient Positioning: Upright in bed Baseline Vocal Quality: Normal Volitional Cough: Strong Volitional Swallow: Able to elicit    Oral/Motor/Sensory Function Overall Oral Motor/Sensory Function: Within functional limits   Ice Chips Ice chips: Within functional limits   Thin Liquid Thin Liquid: Within functional limits    Nectar Thick Nectar Thick Liquid: Not tested   Honey Thick Honey Thick Liquid: Not tested   Puree Puree: Within functional limits   Solid     Solid: Within functional limits      Blenda Mounts Laurice 02/23/2020,2:35 PM   Marchelle Folks L. Samson Frederic, MA CCC/SLP Acute Rehabilitation Services Office number [phone] Pager [phone]

## 2020-02-23 NOTE — ED Notes (Addendum)
Wife very demanding on the hallway continuously asking for pain medication for the pt. Wife oriented that we are doing all what we can for making him comfortable and he will get medications as soon as provider ordered, family oriented to keep inside the room and use the call bell for assistance do to privacy of other patient we can have her waking out of the room constantly. Wife got defensive about the situation, family and pt assure that we are doing all what we can to help him at this time.

## 2020-02-23 NOTE — Progress Notes (Signed)
Pharmacy Antibiotic Note  [name] is a 59 y.o. male admitted on 02/22/2020 with sepsis.  Pharmacy has been consulted for vancomycin dosing.  Of note pt had recent urine cx at a non-[facility] growing Enterococcus faecalis and had been on ABX as outpt without improvement; d/w MD who wishes to broaden coverage given signs of sepsis.  Plan: Vancomycin 2000mg  x1 then 1250mg  IV Q 12 hrs. Goal AUC 400-550.  Expected AUC 520.  SCr used 1.05.   Height: 5\' 10"  (177.8 cm) Weight: 93 kg (205 lb 0.4 oz) IBW/kg (Calculated) : 73  Temp (24hrs), Avg:100.1 F (37.8 C), Min:99.2 F (37.3 C), Max:101.5 F (38.6 C)  Recent Labs  Lab 02/22/20 2333 02/23/20 0450  WBC 10.5 18.7*  CREATININE 1.05  --   LATICACIDVEN 2.2*  --     Estimated Creatinine Clearance: 90 mL/min (by C-G formula based on SCr of 1.05 mg/dL).    No Known Allergies   Thank you for allowing pharmacy to be a part of this patient's care.  02/24/20, PharmD, BCPS  02/23/2020 5:57 AM

## 2020-02-23 NOTE — Procedures (Signed)
Echo attempted. Patient just arrived in room from ED. Will attempt again later.

## 2020-02-23 NOTE — ED Notes (Signed)
SBP 88. Dr. Blinda Leatherwood Informed. Verbal order 1 L LR. LR started.   Primary RN Windy Kalata Informed.

## 2020-02-23 NOTE — Progress Notes (Signed)
PROGRESS NOTE    [name]  YIR:[id number] DOB: 10/03/1961 DOA: 02/22/2020 [facility]   Brief Narrative:  Patient admitted earlier this morning by Dr. Cyd Silence, please see his note for further details.  Essentially patient no history of progressive multiple sclerosis, pseudobulbar affect, spastic quadriparesis, neurogenic bladder, asthma and depression presents with generalized weakness and spasms.  Noted to be septic from likely the eye with Enterococcus previously grown on culture, continues on ceftriaxone and vancomycin.  His blood pressure remarkably low at intake concerning for need for pressors however after appropriate IV fluid resuscitation patient's blood pressure is stabilized.  Continue to follow closely, CT abdomen pelvis and chest with contrast to rule out further infectious causes, abscess as well as PE currently pending.  We will follow along later today.  **Afternoon update, CT PE negative for filling defect, CT abdomen pelvis without overt infectious etiology, we will presume patient has Enterococcus UTI until cultures specify otherwise, continue antibiotics and supportive care.  Assessment & Plan:   Principal Problem:   Sepsis due to Enterococcus [facility]) Active Problems:   Multiple sclerosis, secondary progressive (HCC)   Neurogenic bladder   Spastic quadriparesis (HCC)   Lactic acidosis   Enterococcus UTI   Hyperkalemia   Penile bleeding   Acute respiratory failure with hypoxemia [facility])   Radiology Studies: CT ANGIO CHEST PE W OR WO CONTRAST  Result Date: 02/23/2020 CLINICAL DATA:  Respiratory failure and possible sepsis. EXAM: CT ANGIOGRAPHY CHEST CT ABDOMEN AND PELVIS WITH CONTRAST TECHNIQUE: Multidetector CT imaging of the chest was performed using the standard protocol during bolus administration of intravenous contrast. Multiplanar CT image reconstructions and MIPs were obtained to evaluate the vascular anatomy. Multidetector CT imaging of  the abdomen and pelvis was performed using the standard protocol during bolus administration of intravenous contrast. CONTRAST:  149mL OMNIPAQUE IOHEXOL 350 MG/ML SOLN COMPARISON:  Chest CT 09/11/2017 FINDINGS: CTA CHEST FINDINGS Cardiovascular: Satisfactory opacification of the pulmonary arteries to the segmental level. No evidence of pulmonary embolism. Normal heart size. No pericardial effusion. Mediastinum/Nodes: Negative for adenopathy or mass Lungs/Pleura: Low volume chest with streaky opacity in the lower lungs consistent with atelectasis or scarring, also noted on prior but more extensive today. Musculoskeletal: No acute finding Review of the MIP images confirms the above findings. CT ABDOMEN and PELVIS FINDINGS Hepatobiliary: Subcapsular low-density in the high right liver measuring 16 mm, stable from 2018 and thus considered benign.No evidence of biliary obstruction or stone. Pancreas: Unremarkable. Spleen: Unremarkable. Adrenals/Urinary Tract: Negative adrenals. No hydronephrosis or stone. Collapsed bladder around a Foley catheter. Stomach/Bowel: No obstruction. No appendicitis or other visible bowel inflammation. Vascular/Lymphatic: No acute vascular abnormality. Mild atherosclerosis. No mass or adenopathy. Reproductive:No pathologic findings. Other: No ascites or pneumoperitoneum. Musculoskeletal: No acute abnormalities.  Chronic L5 pars defects Review of the MIP images confirms the above findings. IMPRESSION: Chest CTA: 1. Negative for pulmonary embolism. 2. Very low volume chest with atelectasis/scarring. Abdominal CT: No acute finding or explanation for history of sepsis. Electronically Signed   By: Monte Fantasia M.D.   On: 02/23/2020 09:05   CT ABDOMEN PELVIS W CONTRAST  Result Date: 02/23/2020 CLINICAL DATA:  Respiratory failure and possible sepsis. EXAM: CT ANGIOGRAPHY CHEST CT ABDOMEN AND PELVIS WITH CONTRAST TECHNIQUE: Multidetector CT imaging of the chest was performed using the standard  protocol during bolus administration of intravenous contrast. Multiplanar CT image reconstructions and MIPs were obtained to evaluate the vascular anatomy. Multidetector CT imaging of the abdomen and pelvis was performed  using the standard protocol during bolus administration of intravenous contrast. CONTRAST:  OMNIPAQUE IOHEXOL 350 MG/ML SOLN COMPARISON:  Chest CT 09/11/2017 FINDINGS: CTA CHEST FINDINGS Cardiovascular: Satisfactory opacification of the pulmonary arteries to the segmental level. No evidence of pulmonary embolism. Normal heart size. No pericardial effusion. Mediastinum/Nodes: Negative for adenopathy or mass Lungs/Pleura: Low volume chest with streaky opacity in the lower lungs consistent with atelectasis or scarring, also noted on prior but more extensive today. Musculoskeletal: No acute finding Review of the MIP images confirms the above findings. CT ABDOMEN and PELVIS FINDINGS Hepatobiliary: Subcapsular low-density in the high right liver measuring 16 mm, stable from 2018 and thus considered benign.No evidence of biliary obstruction or stone. Pancreas: Unremarkable. Spleen: Unremarkable. Adrenals/Urinary Tract: Negative adrenals. No hydronephrosis or stone. Collapsed bladder around a Foley catheter. Stomach/Bowel: No obstruction. No appendicitis or other visible bowel inflammation. Vascular/Lymphatic: No acute vascular abnormality. Mild atherosclerosis. No mass or adenopathy. Reproductive:No pathologic findings. Other: No ascites or pneumoperitoneum. Musculoskeletal: No acute abnormalities.  Chronic L5 pars defects Review of the MIP images confirms the above findings. IMPRESSION: Chest CTA: 1. Negative for pulmonary embolism. 2. Very low volume chest with atelectasis/scarring. Abdominal CT: No acute finding or explanation for history of sepsis. Electronically Signed   By: Marnee Spring M.D.   On: 02/23/2020 09:05   DG Chest Port 1 View  Result Date: 02/23/2020 CLINICAL DATA:  Sepsis  EXAM: PORTABLE CHEST 1 VIEW COMPARISON:  07/19/2018 FINDINGS: Single frontal view of the chest demonstrates a stable cardiac silhouette. The lung volumes are diminished with subsegmental atelectasis or scarring at the lung bases. No airspace disease, effusion, or pneumothorax. No acute bony abnormalities. IMPRESSION: 1. Low lung volumes, no acute process. Electronically Signed   By: Sharlet Salina M.D.   On: 02/23/2020 01:44        Scheduled Meds: . baclofen  20 mg Oral QHS  . [START ON 02/24/2020] baclofen  5 mg Oral BID WC  . citalopram  40 mg Oral Daily  . Dextromethorphan-quiNIDine  1 capsule Oral BID  . enoxaparin (LOVENOX) injection  40 mg Subcutaneous Q24H  . tamsulosin  0.8 mg Oral Daily   Continuous Infusions: . cefTRIAXone (ROCEPHIN)  IV Stopped (02/23/20 1275)  . lactated ringers 125 mL/hr (02/23/20 0539)  . vancomycin       LOS: 0 days   Time spent:  Azucena Fallen, DO [facility]  If 7PM-7AM, please contact night-coverage [email or web address]  02/23/2020, 2:45 PM

## 2020-02-23 NOTE — Progress Notes (Signed)
Pharmacy Antibiotic Note  [name] is a 60 y.o. male admitted on 02/22/2020 with sepsis.  Pharmacy has been consulted for vancomycin dosing.  Of note pt had recent urine cx at a non-[facility] growing Enterococcus faecalis and had been on ABX as outpt without improvement; d/w MD who wishes to broaden coverage given signs of sepsis.  Plan: Change vancomycin to 1gm IV Q12H F/u renal fxn, C&S, clinical status and peak/trough at SS  Height: 5\' 10"  (177.8 cm) Weight: 93 kg (205 lb 0.4 oz) IBW/kg (Calculated) : 73  Temp (24hrs), Avg:100.1 F (37.8 C), Min:99.2 F (37.3 C), Max:101.5 F (38.6 C)  Recent Labs  Lab 02/22/20 2333 02/23/20 0450 02/23/20 0538  WBC 10.5 18.7*  --   CREATININE 1.05 1.27*  --   LATICACIDVEN 2.2*  --  2.1*    Estimated Creatinine Clearance: 74.4 mL/min (A) (by C-G formula based on SCr of 1.27 mg/dL (H)).    No Known Allergies   Thank you for allowing pharmacy to be a part of this patient's care.  02/25/20, PharmD, BCPS Clinical Pharmacist Please see AMION for all pharmacy numbers 02/23/2020 9:23 AM

## 2020-02-23 NOTE — ED Notes (Signed)
Lunch Tray Ordered @ 1051. 

## 2020-02-24 ENCOUNTER — Other Ambulatory Visit (HOSPITAL_COMMUNITY): Payer: No Typology Code available for payment source

## 2020-02-24 ENCOUNTER — Inpatient Hospital Stay (HOSPITAL_COMMUNITY): Payer: No Typology Code available for payment source

## 2020-02-24 DIAGNOSIS — I5021 Acute systolic (congestive) heart failure: Secondary | ICD-10-CM

## 2020-02-24 LAB — COMPREHENSIVE METABOLIC PANEL
ALT: 28 U/L (ref 0–44)
AST: 42 U/L — ABNORMAL HIGH (ref 15–41)
Albumin: 2.9 g/dL — ABNORMAL LOW (ref 3.5–5.0)
Alkaline Phosphatase: 82 U/L (ref 38–126)
Anion gap: 11 (ref 5–15)
BUN: 22 mg/dL — ABNORMAL HIGH (ref 6–20)
CO2: 19 mmol/L — ABNORMAL LOW (ref 22–32)
Calcium: 8.2 mg/dL — ABNORMAL LOW (ref 8.9–10.3)
Chloride: 109 mmol/L (ref 98–111)
Creatinine, Ser: 1.09 mg/dL (ref 0.61–1.24)
GFR calc Af Amer: >60 mL/min (ref >60)
GFR calc non Af Amer: >60 mL/min (ref >60)
Glucose, Bld: 78 mg/dL (ref 70–99)
Potassium: 4.4 mmol/L (ref 3.5–5.1)
Sodium: 139 mmol/L (ref 135–145)
Total Bilirubin: 1.7 mg/dL — ABNORMAL HIGH (ref 0.3–1.2)
Total Protein: 5.8 g/dL — ABNORMAL LOW (ref 6.5–8.1)

## 2020-02-24 LAB — CBC
HCT: 37.6 % — ABNORMAL LOW (ref 39.0–52.0)
Hemoglobin: 12 g/dL — ABNORMAL LOW (ref 13.0–17.0)
MCH: 30.9 pg (ref 26.0–34.0)
MCHC: 31.9 g/dL (ref 30.0–36.0)
MCV: 96.9 fL (ref 80.0–100.0)
Platelets: 130 10*3/uL — ABNORMAL LOW (ref 150–400)
RBC: 3.88 MIL/uL — ABNORMAL LOW (ref 4.22–5.81)
RDW: 14.6 % (ref 11.5–15.5)
WBC: 23.4 10*3/uL — ABNORMAL HIGH (ref 4.0–10.5)
nRBC: 0 % (ref 0.0–0.2)

## 2020-02-24 LAB — MAGNESIUM: Magnesium: 1.7 mg/dL (ref 1.7–2.4)

## 2020-02-24 LAB — ECHOCARDIOGRAM COMPLETE
Height: 70 in
Weight: 3280.44 oz

## 2020-02-24 LAB — D-DIMER, QUANTITATIVE: D-Dimer, Quant: 2.9 ug/mL-FEU — ABNORMAL HIGH (ref 0.00–0.50)

## 2020-02-24 LAB — BRAIN NATRIURETIC PEPTIDE: B Natriuretic Peptide: 120.1 pg/mL — ABNORMAL HIGH (ref 0.0–100.0)

## 2020-02-24 LAB — MRSA PCR SCREENING: MRSA by PCR: NEGATIVE

## 2020-02-24 LAB — PROCALCITONIN: Procalcitonin: 16.02 ng/mL

## 2020-02-24 LAB — LACTIC ACID, PLASMA: Lactic Acid, Venous: 1.8 mmol/L (ref 0.5–1.9)

## 2020-02-24 MED ORDER — LACTATED RINGERS IV SOLN: INTRAVENOUS | Status: DC

## 2020-02-24 MED ORDER — PERFLUTREN LIPID MICROSPHERE
1.0000 mL | INTRAVENOUS | Status: AC | PRN
  Administered 2020-02-24: 3 mL via INTRAVENOUS
  Filled 2020-02-24: qty 10

## 2020-02-24 NOTE — Progress Notes (Signed)
  Echocardiogram 2D Echocardiogram has been performed.  Tye Savoy 02/24/2020, 3:45 PM

## 2020-02-24 NOTE — Progress Notes (Addendum)
PROGRESS NOTE                                                                                                                                                                                                             Patient Demographics:    [name], is a 59 y.o. male, DOB - [birth year], WEX:[id number]  Admit date - 02/22/2020   Admitting Physician Marinda Elk, MD  Outpatient Primary MD for the patient is [facility]  LOS - 1  Chief Complaint  Patient presents with  . Spasms       Brief Narrative  59 year old male with past medical history of secondary progressive multiple sclerosis, pseudobulbar affect, spastic quadriparesis, neurogenic bladder, asthma and depression who presents to [facility] emergency department due to progressively worsening generalized weakness, he was recently having problems with urinary retention and urologist Dr. Audria Nine had placed a Foley catheter 5 days ago, he was also diagnosed with UTI and prescribed an antibiotic which he could not start.  In the ER his presentation was suspicious for sepsis most likely due to UTI and he was admitted.   Subjective:    [name] today has, No headache, No chest pain, No abdominal pain - No Nausea, No new weakness tingling or numbness, No Cough - SOB.    Assessment  & Plan :     1.  Sepsis.  Source unclear.  CT chest abdomen pelvis unremarkable, UA looks stable, did have recent Foley placement 5 days ago and prior to that had urinary retention.  Currently continue Rocephin and vancomycin empirically to which she has responded well, monitor blood and urine cultures, check MRSA nasal PCR, trend procalcitonin and lactic acid.  Overall sepsis pathophysiology seems to have resolved and he looks nontoxic now.  2.  Advanced MS.  Bed bound, now complicated by urinary retention with Foley catheter along with dysphagia.  Supportive care, muscle relaxants to continue, soft diet,  speech eval, continue Foley which was placed 5 days prior to admission.  3.  Urinary retention.  Urine #2 above.  Flomax and Foley to continue.  Follows with Dr. Audria Nine.  4.  Well demarcated 3 cm geographic macular lesions 1 on the left mid leg and 2 on the right mid leg.  Unknown duration, does not appear infectious will monitor.   Condition - Extremely Guarded  Family Communication  : Wife [name] [phone] on 02/24/2020 message left at 9:05 AM, daughter updated bedside 02/24/2020.  Code Status : DNR  Consults  :  None  Procedures  :   CTA chest, CT abdomen pelvis. Chest CTA: 1. Negative for pulmonary embolism. 2. Very low volume chest with atelectasis/scarring. Abdominal CT: No acute finding or explanation for history of sepsis  PUD Prophylaxis : None  Disposition Plan  :    Status is: Inpatient  Remains inpatient appropriate because:IV treatments appropriate due to intensity of illness or inability to take PO   Dispo: The patient is from: Home              Anticipated d/c is to: Home              Anticipated d/c date is: > 3 days              Patient currently is not medically stable to d/c. IV ABX for Sepsis.    DVT Prophylaxis  :  Lovenox    Lab Results  Component Value Date   PLT 182 02/23/2020    Diet :  Diet Order            DIET SOFT Room service appropriate? Yes; Fluid consistency: Thin  Diet effective now               Inpatient Medications Scheduled Meds: . baclofen  20 mg Oral QHS  . baclofen  5 mg Oral BID WC  . Chlorhexidine Gluconate Cloth  6 each Topical Daily  . citalopram  40 mg Oral Daily  . Dextromethorphan-quiNIDine  1 capsule Oral BID  . enoxaparin (LOVENOX) injection  40 mg Subcutaneous Q24H  . tamsulosin  0.8 mg Oral Daily   Continuous Infusions: . cefTRIAXone (ROCEPHIN)  IV 1 g (02/24/20 0548)  . lactated ringers    . vancomycin 1,000 mg (02/24/20 0900)   PRN Meds:.acetaminophen **OR** acetaminophen, morphine,  [DISCONTINUED] ondansetron **OR** ondansetron (ZOFRAN) IV, polyethylene glycol  Antibiotics  :   Anti-infectives (From admission, onward)   Start     Dose/Rate Route Frequency Ordered Stop   02/23/20 1930  vancomycin (VANCOCIN) IVPB 1000 mg/200 mL premix     1,000 mg 200 mL/hr over 60 Minutes Intravenous Every 12 hours 02/23/20 0900     02/23/20 0600  cefTRIAXone (ROCEPHIN) 1 g in sodium chloride 0.9 % 100 mL IVPB     1 g 200 mL/hr over 30 Minutes Intravenous Every 24 hours 02/23/20 0445     02/23/20 0545  vancomycin (VANCOREADY) IVPB 2000 mg/400 mL     2,000 mg 200 mL/hr over 120 Minutes Intravenous  Once 02/23/20 0538 02/23/20 1207   02/23/20 0145  ampicillin (OMNIPEN) 2 g in sodium chloride 0.9 % 100 mL IVPB  Status:  Discontinued     2 g 300 mL/hr over 20 Minutes Intravenous Every 6 hours 02/23/20 0135 02/23/20 0445          Objective:   Vitals:   02/23/20 1940 02/23/20 2318 02/24/20 0400 02/24/20 0800  BP: 92/62     Pulse: 80 81    Resp:      Temp: 97.9 F (36.6 C) 98.2 F (36.8 C) 97.9 F (36.6 C) 98.2 F (36.8 C)  TempSrc: Oral Oral Oral Oral  SpO2:      Weight:      Height:        SpO2: 98 % O2 Flow Rate (L/min): 2 L/min  Wt Readings from Last 3 Encounters:  02/23/20 93 kg  09/05/18 93 kg  05/20/16 79.4 kg     Intake/Output Summary (Last 24  hours) at 02/24/2020 0904 Last data filed at 02/23/2020 1700 Gross per 24 hour  Intake 2400 ml  Output 600 ml  Net 1800 ml     Physical Exam  Awake Alert,  diffuse weakness lower extremity strength 1/5, upper extremity/3/5 left arm better than right, foley catheter in place, does have well-demarcated macular lesions 1 in the left mid leg and 2 in the right mid leg around 2 to 3 cm in diameter.  Will be monitored.  No open sores. [place].AT,PERRAL Supple Neck,No JVD, No cervical lymphadenopathy appriciated.  Symmetrical Chest wall movement, Good air movement bilaterally, CTAB RRR,No Gallops,Rubs or new Murmurs, No  Parasternal Heave +ve B.Sounds, Abd Soft, No tenderness, No organomegaly appriciated, No rebound - guarding or rigidity. No Cyanosis,      Data Review:    Recent Labs  Lab 02/22/20 2333 02/23/20 0450 02/23/20 0604  WBC 10.5 18.7*  --   HGB 15.6 14.0 12.9*  HCT 48.2 43.2 38.0*  PLT 212 182  --   MCV 96.0 95.6  --   MCH 31.1 31.0  --   MCHC 32.4 32.4  --   RDW 14.2 14.0  --   LYMPHSABS 0.5* 0.1*  --   MONOABS 0.1 0.7  --   EOSABS 0.0 0.0  --   BASOSABS 0.0 0.0  --     Recent Labs  Lab 02/22/20 2333 02/23/20 0450 02/23/20 0604 02/23/20 1440  NA 142 142 144  --   K 5.2* 3.9 3.4*  --   CL 107 109  --   --   CO2 22 25  --   --   GLUCOSE 99 118*  --   --   BUN 20 18  --   --   CREATININE 1.05 1.27*  --   --   CALCIUM 8.9 8.6*  --   --   AST  --  26  --   --   ALT  --  20  --   --   ALKPHOS  --  79  --   --   BILITOT  --  1.5*  --   --   ALBUMIN  --  3.4*  --   --   PROCALCITON  --   --   --  38.33  INR  --   --   --  1.1    Recent Labs  Lab 02/23/20 0423 02/23/20 1440  PROCALCITON  --  38.33  SARSCOV2NAA NEGATIVE  --     ------------------------------------------------------------------------------------------------------------------ No results for input(s): CHOL, HDL, LDLCALC, TRIG, CHOLHDL, LDLDIRECT in the last 72 hours.  Lab Results  Component Value Date   HGBA1C 5.4 08/14/2015   ------------------------------------------------------------------------------------------------------------------ No results for input(s): TSH, T4TOTAL, T3FREE, THYROIDAB in the last 72 hours.  Invalid input(s): FREET3 ------------------------------------------------------------------------------------------------------------------ No results for input(s): VITAMINB12, FOLATE, FERRITIN, TIBC, IRON, RETICCTPCT in the last 72 hours.  Coagulation profile Recent Labs  Lab 02/23/20 1440  INR 1.1    No results for input(s): DDIMER in the last 72 hours.  Cardiac  Enzymes No results for input(s): CKMB, TROPONINI, MYOGLOBIN in the last 168 hours.  Invalid input(s): CK ------------------------------------------------------------------------------------------------------------------ No results found for: BNP  Micro Results Recent Results (from the past 240 hour(s))  Urine Culture     Status: None (Preliminary result)   Collection Time: 02/22/20 11:33 PM   Specimen: Urine, Random  Result Value Ref Range Status   Specimen Description URINE, RANDOM  Final   Special Requests NONE  Final  Culture   Final    CULTURE REINCUBATED FOR BETTER GROWTH Performed at [facility] Lab, [address] [place], [place] [address]    Report Status PENDING  Incomplete  SARS Coronavirus 2 by RT PCR (hospital order, performed in [facility] hospital lab) Nasopharyngeal Nasopharyngeal Swab     Status: None   Collection Time: 02/23/20  4:23 AM   Specimen: Nasopharyngeal Swab  Result Value Ref Range Status   SARS Coronavirus 2 NEGATIVE NEGATIVE Final    Comment: (NOTE) SARS-CoV-2 target nucleic acids are NOT DETECTED. The SARS-CoV-2 RNA is generally detectable in upper and lower respiratory specimens during the acute phase of infection. The lowest concentration of SARS-CoV-2 viral copies this assay can detect is 250 copies / mL. A negative result does not preclude SARS-CoV-2 infection and should not be used as the sole basis for treatment or other patient management decisions.  A negative result may occur with improper specimen collection / handling, submission of specimen other than nasopharyngeal swab, presence of viral mutation(s) within the areas targeted by this assay, and inadequate number of viral copies (<250 copies / mL). A negative result must be combined with clinical observations, patient history, and epidemiological information. Fact Sheet for Patients:   BoilerBrush.com.cy Fact Sheet for Healthcare  Providers: https://pope.com/ This test is not yet approved or cleared  by the [place] FDA and has been authorized for detection and/or diagnosis of SARS-CoV-2 by FDA under an Emergency Use Authorization (EUA).  This EUA will remain in effect (meaning this test can be used) for the duration of the COVID-19 declaration under Section 564(b)(1) of the Act, 21 U.S.C. section 360bbb-3(b)(1), unless the authorization is terminated or revoked sooner. Performed at [facility], [address] [place], [place] [address]   Culture, blood (routine x 2)     Status: None (Preliminary result)   Collection Time: 02/23/20  5:17 AM   Specimen: BLOOD  Result Value Ref Range Status   Specimen Description BLOOD RIGHT ANTECUBITAL  Final   Special Requests   Final    BOTTLES DRAWN AEROBIC AND ANAEROBIC Blood Culture results may not be optimal due to an inadequate volume of blood received in culture bottles   Culture   Final    NO GROWTH 1 DAY Performed at [facility], [address], [place], [place] [address]    Report Status PENDING  Incomplete  Culture, blood (routine x 2)     Status: None (Preliminary result)   Collection Time: 02/23/20  2:30 PM   Specimen: BLOOD LEFT ARM  Result Value Ref Range Status   Specimen Description BLOOD LEFT ARM  Final   Special Requests   Final    BOTTLES DRAWN AEROBIC AND ANAEROBIC Blood Culture results may not be optimal due to an excessive volume of blood received in culture bottles   Culture   Final    NO GROWTH < 24 HOURS Performed at [facility], [address] [place], [place] [address]    Report Status PENDING  Incomplete    Radiology Reports CT ANGIO CHEST PE W OR WO CONTRAST  Result Date: 02/23/2020 CLINICAL DATA:  Respiratory failure and possible sepsis. EXAM: CT ANGIOGRAPHY CHEST CT ABDOMEN AND PELVIS WITH CONTRAST TECHNIQUE: Multidetector CT imaging of the chest was performed using the standard  protocol during bolus administration of intravenous contrast. Multiplanar CT image reconstructions and MIPs were obtained to evaluate the vascular anatomy. Multidetector CT imaging of the abdomen and  pelvis was performed using the standard protocol during bolus administration of intravenous contrast. CONTRAST:  OMNIPAQUE IOHEXOL 350 MG/ML SOLN COMPARISON:  Chest CT 09/11/2017 FINDINGS: CTA CHEST FINDINGS Cardiovascular: Satisfactory opacification of the pulmonary arteries to the segmental level. No evidence of pulmonary embolism. Normal heart size. No pericardial effusion. Mediastinum/Nodes: Negative for adenopathy or mass Lungs/Pleura: Low volume chest with streaky opacity in the lower lungs consistent with atelectasis or scarring, also noted on prior but more extensive today. Musculoskeletal: No acute finding Review of the MIP images confirms the above findings. CT ABDOMEN and PELVIS FINDINGS Hepatobiliary: Subcapsular low-density in the high right liver measuring 16 mm, stable from 2018 and thus considered benign.No evidence of biliary obstruction or stone. Pancreas: Unremarkable. Spleen: Unremarkable. Adrenals/Urinary Tract: Negative adrenals. No hydronephrosis or stone. Collapsed bladder around a Foley catheter. Stomach/Bowel: No obstruction. No appendicitis or other visible bowel inflammation. Vascular/Lymphatic: No acute vascular abnormality. Mild atherosclerosis. No mass or adenopathy. Reproductive:No pathologic findings. Other: No ascites or pneumoperitoneum. Musculoskeletal: No acute abnormalities.  Chronic L5 pars defects Review of the MIP images confirms the above findings. IMPRESSION: Chest CTA: 1. Negative for pulmonary embolism. 2. Very low volume chest with atelectasis/scarring. Abdominal CT: No acute finding or explanation for history of sepsis. Electronically Signed   By: Marnee Spring M.D.   On: 02/23/2020 09:05   CT ABDOMEN PELVIS W CONTRAST  Result Date: 02/23/2020 CLINICAL DATA:   Respiratory failure and possible sepsis. EXAM: CT ANGIOGRAPHY CHEST CT ABDOMEN AND PELVIS WITH CONTRAST TECHNIQUE: Multidetector CT imaging of the chest was performed using the standard protocol during bolus administration of intravenous contrast. Multiplanar CT image reconstructions and MIPs were obtained to evaluate the vascular anatomy. Multidetector CT imaging of the abdomen and pelvis was performed using the standard protocol during bolus administration of intravenous contrast. CONTRAST:  OMNIPAQUE IOHEXOL 350 MG/ML SOLN COMPARISON:  Chest CT 09/11/2017 FINDINGS: CTA CHEST FINDINGS Cardiovascular: Satisfactory opacification of the pulmonary arteries to the segmental level. No evidence of pulmonary embolism. Normal heart size. No pericardial effusion. Mediastinum/Nodes: Negative for adenopathy or mass Lungs/Pleura: Low volume chest with streaky opacity in the lower lungs consistent with atelectasis or scarring, also noted on prior but more extensive today. Musculoskeletal: No acute finding Review of the MIP images confirms the above findings. CT ABDOMEN and PELVIS FINDINGS Hepatobiliary: Subcapsular low-density in the high right liver measuring 16 mm, stable from 2018 and thus considered benign.No evidence of biliary obstruction or stone. Pancreas: Unremarkable. Spleen: Unremarkable. Adrenals/Urinary Tract: Negative adrenals. No hydronephrosis or stone. Collapsed bladder around a Foley catheter. Stomach/Bowel: No obstruction. No appendicitis or other visible bowel inflammation. Vascular/Lymphatic: No acute vascular abnormality. Mild atherosclerosis. No mass or adenopathy. Reproductive:No pathologic findings. Other: No ascites or pneumoperitoneum. Musculoskeletal: No acute abnormalities.  Chronic L5 pars defects Review of the MIP images confirms the above findings. IMPRESSION: Chest CTA: 1. Negative for pulmonary embolism. 2. Very low volume chest with atelectasis/scarring. Abdominal CT: No acute finding or  explanation for history of sepsis. Electronically Signed   By: Marnee Spring M.D.   On: 02/23/2020 09:05   DG Chest Port 1 View  Result Date: 02/23/2020 CLINICAL DATA:  Sepsis EXAM: PORTABLE CHEST 1 VIEW COMPARISON:  07/19/2018 FINDINGS: Single frontal view of the chest demonstrates a stable cardiac silhouette. The lung volumes are diminished with subsegmental atelectasis or scarring at the lung bases. No airspace disease, effusion, or pneumothorax. No acute bony abnormalities. IMPRESSION: 1. Low lung volumes, no acute process. Electronically Signed   By:  Sharlet Salina M.D.   On: 02/23/2020 01:44    Time Spent in minutes  30   Susa Raring M.D on 02/24/2020 at 9:04 AM  To page go to [email or web address] - password [email or web address]

## 2020-02-24 NOTE — Evaluation (Addendum)
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  Has macular lesions on BLE's, urinary retention with recent foley.  PMHx:  MS, spastic quad, pseudobulbar affect, neurogenic bladder, depression, asthma,   Clinical Impression  Pt was seen for ROM to legs and arms, more restricted on RUE than L due to mm pain.  Pt has fairly good ROM on arms, just not able to utilize the range with his symptoms since having tone worsen from sepsis.  Follow acutely to work on increasing ROM to arms and legs, to promote better sitting balance control and to work on ongoing needs for home.  Family asking about a hospital bed, and talked with MD about ordering one to increase his comfortable resting posture.  Pt is having neck pain but also sleeping in wc at night.      Follow Up Recommendations Home health PT;Supervision/Assistance - 24 hour    Equipment Recommendations  Hospital bed    Recommendations for Other Services       Precautions / Restrictions Precautions Precautions: Fall Precaution Comments: unstable sitting balance Restrictions Weight Bearing Restrictions: No Other Position/Activity Restrictions: heels are floating to avoid pressure changes on skin  Two person assist needed to work on sitting balance control.  Quadriparesis from MS, limited UE movement.     Mobility  Bed Mobility Overal bed mobility: Needs Assistance Bed Mobility: Supine to Sit;Sit to Supine     Supine to sit: Max assist;+2 for physical assistance;+2 for safety/equipment(2 if side of bed) Sit to supine: Max assist;+2 for physical assistance;+2 for safety/equipment(2 if side of bed)      Transfers Overall transfer level: Needs assistance Equipment used: (hoyer  lift) Transfers: (bed to chair)           General transfer comment: total assist  Ambulation/Gait             General Gait Details: not ambulatory  Stairs            Wheelchair Mobility    Modified Rankin (Stroke Patients Only)       Balance Overall balance assessment: Needs assistance                                           Pertinent Vitals/Pain      Home Living Family/patient expects to be discharged to:: Private residence Living Arrangements: Spouse/significant other;Children Available Help at Discharge: Family;Available 24 hours/day Type of Home: House Home Access: Ramped entrance     Home Layout: One level Home Equipment: Wheelchair - Psychologist, educational;Tub bench;Other (comment)(power lift to get to BR, bedroom) Additional Comments: family is available and supportive    Prior Function Level of Independence: Needs assistance   Gait / Transfers Assistance Needed: non ambulatory, using power lift  ADL's / Homemaking Assistance Needed: wife cares for pt and home        Hand Dominance   Dominant Hand: Right(using L hand due to severe weakness on RUE)    Extremity/Trunk Assessment   Upper Extremity Assessment Upper Extremity Assessment: Generalized weakness    Lower Extremity Assessment Lower Extremity Assessment: Generalized weakness    Cervical / Trunk Assessment Cervical / Trunk Assessment: Kyphotic  Communication      Cognition Arousal/Alertness: Awake/alert Behavior During Therapy: Flat affect Overall Cognitive Status: Within Functional Limits for tasks assessed                                        General Comments General comments (skin integrity, edema, etc.): pt sat up in center of bed initially and could not tolerate a 90deg posture of hips.  Has significant ROM restrictions of spine and hips    Exercises     Assessment/Plan    PT Assessment Patient needs continued PT  services  PT Problem List Decreased strength;Decreased range of motion;Decreased activity tolerance;Decreased balance;Decreased mobility;Decreased coordination;Decreased skin integrity;Pain       PT Treatment Interventions DME instruction;Therapeutic activities;Therapeutic exercise;Balance training;Neuromuscular re-education;Patient/family education    PT Goals (Current goals can be found in the Care Plan section)  Acute Rehab PT Goals Patient Stated Goal: to be comfortable PT Goal Formulation: With patient/family Time For Goal Achievement: 03/09/20 Potential to Achieve Goals: Good    Frequency Min 3X/week   Barriers to discharge Decreased caregiver support home with spouse    Co-evaluation               AM-PAC PT "6 Clicks" Mobility  Outcome Measure Help needed turning from your back to your side while in a flat bed without using bedrails?: Total Help needed moving from lying on your back to sitting on the side of a flat bed without using bedrails?: Total Help needed moving to and from a bed to a chair (including a wheelchair)?: Total Help needed standing up from a chair using your arms (e.g., wheelchair or bedside chair)?: Total Help needed to walk in hospital room?: Total Help needed climbing 3-5 steps with a railing? : Total 6 Click Score: 6    End of Session Equipment Utilized During Treatment: Oxygen Activity Tolerance: Treatment limited secondary to medical complications (Comment);Patient limited by pain Patient left: in bed;with call bell/phone within reach;with bed alarm set;with family/visitor present Nurse Communication: Mobility status PT Visit Diagnosis: Muscle weakness (generalized) (M62.81);Pain Pain - Right/Left: (bilateral) Pain - part of body: Hip(spine)    Time: 8756-4332 PT Time Calculation (min) (ACUTE ONLY): 39 min   Charges:   PT Evaluation $PT Eval Moderate Complexity: 1 Mod PT Treatments $Therapeutic Exercise: 8-22 mins $Therapeutic  Activity: 8-22 mins       Ivar Drape 02/24/2020, 2:14 PM  Samul Dada, PT [place] Acute Rehab Dept. Number: [facility] [id number] and [facility] [phone]

## 2020-02-25 LAB — COMPREHENSIVE METABOLIC PANEL
ALT: 22 U/L (ref 0–44)
AST: 37 U/L (ref 15–41)
Albumin: 2.5 g/dL — ABNORMAL LOW (ref 3.5–5.0)
Alkaline Phosphatase: 65 U/L (ref 38–126)
Anion gap: 7 (ref 5–15)
BUN: 13 mg/dL (ref 6–20)
CO2: 26 mmol/L (ref 22–32)
Calcium: 8.4 mg/dL — ABNORMAL LOW (ref 8.9–10.3)
Chloride: 108 mmol/L (ref 98–111)
Creatinine, Ser: 1 mg/dL (ref 0.61–1.24)
GFR calc Af Amer: >60 mL/min (ref >60)
GFR calc non Af Amer: >60 mL/min (ref >60)
Glucose, Bld: 77 mg/dL (ref 70–99)
Potassium: 3.4 mmol/L — ABNORMAL LOW (ref 3.5–5.1)
Sodium: 141 mmol/L (ref 135–145)
Total Bilirubin: 1.1 mg/dL (ref 0.3–1.2)
Total Protein: 5 g/dL — ABNORMAL LOW (ref 6.5–8.1)

## 2020-02-25 LAB — URINE CULTURE: Culture: >=100000 — AB

## 2020-02-25 LAB — PROCALCITONIN: Procalcitonin: 19.76 ng/mL

## 2020-02-25 LAB — CBC
HCT: 35.4 % — ABNORMAL LOW (ref 39.0–52.0)
Hemoglobin: 11.6 g/dL — ABNORMAL LOW (ref 13.0–17.0)
MCH: 31.4 pg (ref 26.0–34.0)
MCHC: 32.8 g/dL (ref 30.0–36.0)
MCV: 95.9 fL (ref 80.0–100.0)
Platelets: 144 10*3/uL — ABNORMAL LOW (ref 150–400)
RBC: 3.69 MIL/uL — ABNORMAL LOW (ref 4.22–5.81)
RDW: 14.2 % (ref 11.5–15.5)
WBC: 17.5 10*3/uL — ABNORMAL HIGH (ref 4.0–10.5)
nRBC: 0 % (ref 0.0–0.2)

## 2020-02-25 LAB — BRAIN NATRIURETIC PEPTIDE: B Natriuretic Peptide: 119.5 pg/mL — ABNORMAL HIGH (ref 0.0–100.0)

## 2020-02-25 LAB — C-REACTIVE PROTEIN: CRP: 12.9 mg/dL — ABNORMAL HIGH (ref <1.0)

## 2020-02-25 LAB — MAGNESIUM: Magnesium: 1.8 mg/dL (ref 1.7–2.4)

## 2020-02-25 MED ORDER — FOSFOMYCIN TROMETHAMINE 3 G PO PACK
3.0000 g | PACK | Freq: Once | ORAL | Status: AC
  Administered 2020-02-25: 3 g via ORAL
  Filled 2020-02-25: qty 3

## 2020-02-25 MED ORDER — POTASSIUM CHLORIDE CRYS ER 20 MEQ PO TBCR
40.0000 meq | EXTENDED_RELEASE_TABLET | Freq: Once | ORAL | Status: AC
  Administered 2020-02-25: 40 meq via ORAL
  Filled 2020-02-25: qty 2

## 2020-02-25 NOTE — Progress Notes (Signed)
PROGRESS NOTE                                                                                                                                                                                                             Patient Demographics:    [name], is a 59 y.o. male, DOB - 10-08-1961, WIO:[id number]  Admit date - 02/22/2020   Admitting Physician Marinda Elk, MD  Outpatient Primary [facility] for the patient is [facility]  LOS - 2  Chief Complaint  Patient presents with  . Spasms       Brief Narrative  59 year old male with past medical history of secondary progressive multiple sclerosis, pseudobulbar affect, spastic quadriparesis, neurogenic bladder, asthma and depression who presents to [facility] emergency department due to progressively worsening generalized weakness, he was recently having problems with urinary retention and urologist Dr. Audria Nine had placed a Foley catheter 5 days ago, he was also diagnosed with UTI and prescribed an antibiotic which he could not start.  In the ER his presentation was suspicious for sepsis most likely due to UTI and he was admitted.   Subjective:   Patient in bed, appears comfortable, denies any headache, no fever, no chest pain or pressure, no shortness of breath , no abdominal pain. No focal weakness.   Assessment  & Plan :     1.  Sepsis with Enterococcus UTI with high procalcitonin.  CT chest abdomen pelvis unremarkable, UA looks stable, did have recent Foley placement 5 days ago and prior to that had urinary retention.  Currently continue Rocephin and Vancomycin empirically to which he is responding very well to these medications, appears nontoxic and sepsis pathophysiology has resolved, final culture sensitivity pending 1 dose of fosfomycin on 02/25/20, will follow final C&S, trend Procalcitonin.  2.  Advanced MS.  Bed bound, now complicated by urinary retention with Foley catheter along with  dysphagia.  Supportive care, muscle relaxants to continue, soft diet, speech eval, continue Foley which was placed 5 days prior to admission.  3.  Urinary retention.  Urine #2 above.  Flomax and Foley to continue.  Follows with Dr. Audria Nine.  4.  Well demarcated 3 cm geographic macular lesions 1 on the left mid leg and 2 on the right mid leg, chronic per wife.  According to wife -from constant pressure irritation caused by bed and wheelchair.  5.  Hypokalemia.  Replaced.   Condition - Extremely Guarded  Family Communication  : Wife [name] [phone]  on 02/25/2020, previous listed number was wrong.   Code Status : DNR  Consults  :  None  Procedures  :   CTA chest, CT abdomen pelvis. Chest CTA: 1. Negative for pulmonary embolism. 2. Very low volume chest with atelectasis/scarring. Abdominal CT: No acute finding or explanation for history of sepsis  PUD Prophylaxis : None  Disposition Plan  :    Status is: Inpatient  Remains inpatient appropriate because:IV treatments appropriate due to intensity of illness or inability to take PO   Dispo: The patient is from: Home              Anticipated d/c is to: Home              Anticipated d/c date is: > 3 days              Patient currently is not medically stable to d/c. IV ABX for Sepsis.    DVT Prophylaxis  :  Lovenox    Lab Results  Component Value Date   PLT 144 (L) 02/25/2020    Diet :  Diet Order            DIET SOFT Room service appropriate? No; Fluid consistency: Thin  Diet effective now               Inpatient Medications Scheduled Meds: . baclofen  20 mg Oral QHS  . baclofen  5 mg Oral BID WC  . Chlorhexidine Gluconate Cloth  6 each Topical Daily  . citalopram  40 mg Oral Daily  . Dextromethorphan-quiNIDine  1 capsule Oral BID  . enoxaparin (LOVENOX) injection  40 mg Subcutaneous Q24H  . tamsulosin  0.8 mg Oral Daily   Continuous Infusions: . cefTRIAXone (ROCEPHIN)  IV 1 g (02/25/20 0646)  . vancomycin  1,000 mg (02/25/20 0838)   PRN Meds:.acetaminophen **OR** acetaminophen, morphine, [DISCONTINUED] ondansetron **OR** ondansetron (ZOFRAN) IV, polyethylene glycol  Antibiotics  :   Anti-infectives (From admission, onward)   Start     Dose/Rate Route Frequency Ordered Stop   02/23/20 1930  vancomycin (VANCOCIN) IVPB 1000 mg/200 mL premix     1,000 mg 200 mL/hr over 60 Minutes Intravenous Every 12 hours 02/23/20 0900     02/23/20 0600  cefTRIAXone (ROCEPHIN) 1 g in sodium chloride 0.9 % 100 mL IVPB     1 g 200 mL/hr over 30 Minutes Intravenous Every 24 hours 02/23/20 0445     02/23/20 0545  vancomycin (VANCOREADY) IVPB 2000 mg/400 mL     2,000 mg 200 mL/hr over 120 Minutes Intravenous  Once 02/23/20 0538 02/23/20 1207   02/23/20 0145  ampicillin (OMNIPEN) 2 g in sodium chloride 0.9 % 100 mL IVPB  Status:  Discontinued     2 g 300 mL/hr over 20 Minutes Intravenous Every 6 hours 02/23/20 0135 02/23/20 0445        Objective:   Vitals:   02/24/20 2258 02/25/20 0000 02/25/20 0200 02/25/20 0600  BP:  (!) 93/58 (!) 83/51 (!) 88/52  Pulse: 92  76 73  Resp:  Temp: 98.6 F (37 C) 98.1 F (36.7 C) 98.1 F (36.7 C) 98 F (36.7 C)  TempSrc: Oral Oral Oral Oral  SpO2:   98% 99%  Weight:      Height:        SpO2: 99 % O2 Flow Rate (L/min): 2 L/min FiO2 (%): (!) 2 %  Wt Readings from Last 3 Encounters:  02/23/20 93 kg  09/05/18 93 kg  05/20/16 79.4 kg     Intake/Output Summary (Last 24 hours) at 02/25/2020 0843 Last data filed at 02/25/2020 0646 Gross per 24 hour  Intake 240 ml  Output 1150 ml  Net -910 ml     Physical Exam  Awake Alert,  diffuse weakness lower extremity strength 1/5, upper extremity/3/5 left arm better than right, foley catheter in place, does have well-demarcated macular lesions 1 in the left mid leg and 2 in the right mid leg around 2 to 3 cm in diameter.  Will be monitored.  No open sores. [place].AT,PERRAL Supple Neck,No JVD, No cervical  lymphadenopathy appriciated.  Symmetrical Chest wall movement, Good air movement bilaterally, CTAB RRR,No Gallops, Rubs or new Murmurs, No Parasternal Heave +ve B.Sounds, Abd Soft, No tenderness, No organomegaly appriciated, No rebound - guarding or rigidity. No Cyanosis,         Data Review:    Recent Labs  Lab 02/22/20 2333 02/23/20 0450 02/23/20 0604 02/24/20 1033 02/25/20 0531  WBC 10.5 18.7*  --  23.4* 17.5*  HGB 15.6 14.0 12.9* 12.0* 11.6*  HCT 48.2 43.2 38.0* 37.6* 35.4*  PLT 212 182  --  130* 144*  MCV 96.0 95.6  --  96.9 95.9  MCH 31.1 31.0  --  30.9 31.4  MCHC 32.4 32.4  --  31.9 32.8  RDW 14.2 14.0  --  14.6 14.2  LYMPHSABS 0.5* 0.1*  --   --   --   MONOABS 0.1 0.7  --   --   --   EOSABS 0.0 0.0  --   --   --   BASOSABS 0.0 0.0  --   --   --     Recent Labs  Lab 02/22/20 2333 02/23/20 0450 02/23/20 0604 02/23/20 1440 02/24/20 0713 02/24/20 1033 02/25/20 0531  NA 142 142 144  --  139  --  141  K 5.2* 3.9 3.4*  --  4.4  --  3.4*  CL 107 109  --   --  109  --  108  CO2 22 25  --   --  19*  --  26  GLUCOSE 99 118*  --   --  78  --  77  BUN 20 18  --   --  22*  --  13  CREATININE 1.05 1.27*  --   --  1.09  --  1.00  CALCIUM 8.9 8.6*  --   --  8.2*  --  8.4*  AST  --  26  --   --  42*  --  37  ALT  --  20  --   --  28  --  22  ALKPHOS  --  79  --   --  82  --  65  BILITOT  --  1.5*  --   --  1.7*  --  1.1  ALBUMIN  --  3.4*  --   --  2.9*  --  2.5*  MG  --   --   --   --  1.7  --  1.8  CRP  --   --   --   --   --   --  12.9*  DDIMER  --   --   --   --   --  2.90*  --   PROCALCITON  --   --   --  38.33 16.02  --  19.76  INR  --   --   --  1.1  --   --   --   BNP  --   --   --   --   --  120.1* 119.5*    Recent Labs  Lab 02/23/20 0423 02/23/20 1440 02/24/20 0713 02/24/20 1033 02/25/20 0531  CRP  --   --   --   --  12.9*  DDIMER  --   --   --  2.90*  --   BNP  --   --   --  120.1* 119.5*  PROCALCITON  --  38.33 16.02  --  19.76  SARSCOV2NAA  NEGATIVE  --   --   --   --     ------------------------------------------------------------------------------------------------------------------ No results for input(s): CHOL, HDL, LDLCALC, TRIG, CHOLHDL, LDLDIRECT in the last 72 hours.  Lab Results  Component Value Date   HGBA1C 5.4 08/14/2015   ------------------------------------------------------------------------------------------------------------------ No results for input(s): TSH, T4TOTAL, T3FREE, THYROIDAB in the last 72 hours.  Invalid input(s): FREET3 ------------------------------------------------------------------------------------------------------------------ No results for input(s): VITAMINB12, FOLATE, FERRITIN, TIBC, IRON, RETICCTPCT in the last 72 hours.  Coagulation profile Recent Labs  Lab 02/23/20 1440  INR 1.1    Recent Labs    02/24/20 1033  DDIMER 2.90*    Cardiac Enzymes No results for input(s): CKMB, TROPONINI, MYOGLOBIN in the last 168 hours.  Invalid input(s): CK ------------------------------------------------------------------------------------------------------------------    Component Value Date/Time   BNP 119.5 (H) 02/25/2020 0531    Micro Results Recent Results (from the past 240 hour(s))  Urine Culture     Status: Abnormal   Collection Time: 02/22/20 11:33 PM   Specimen: Urine, Random  Result Value Ref Range Status   Specimen Description URINE, RANDOM  Final   Special Requests   Final    NONE Performed at [facility], [address] [place], [place] [address]    Culture >=100,000 COLONIES/mL ENTEROCOCCUS FAECALIS (A)  Final   Report Status 02/25/2020 FINAL  Final   Organism ID, Bacteria ENTEROCOCCUS FAECALIS (A)  Final      Susceptibility   Enterococcus faecalis - MIC*    AMPICILLIN <=2 SENSITIVE Sensitive     NITROFURANTOIN <=16 SENSITIVE Sensitive     VANCOMYCIN <=0.5 SENSITIVE Sensitive     * >=100,000 COLONIES/mL ENTEROCOCCUS FAECALIS  SARS Coronavirus 2 by  RT PCR (hospital order, performed in [facility] hospital lab) Nasopharyngeal Nasopharyngeal Swab     Status: None   Collection Time: 02/23/20  4:23 AM   Specimen: Nasopharyngeal Swab  Result Value Ref Range Status   SARS Coronavirus 2 NEGATIVE NEGATIVE Final    Comment: (NOTE) SARS-CoV-2 target nucleic acids are NOT DETECTED. The SARS-CoV-2 RNA is generally detectable in upper and lower respiratory specimens during the acute phase of infection. The lowest concentration of SARS-CoV-2 viral copies this assay can detect is 250 copies / mL. A negative result does not preclude SARS-CoV-2 infection and should not be used as the sole basis for treatment or other patient management decisions.  A negative result may occur with improper specimen collection / handling, submission of specimen other than nasopharyngeal swab, presence of viral mutation(s) within the areas targeted by this assay, and inadequate number of viral copies (<250 copies / mL). A negative result must be combined with clinical observations, patient history, and epidemiological information. Fact Sheet for Patients:   BoilerBrush.com.cy Fact Sheet for Healthcare Providers: https://pope.com/ This test is not yet approved or cleared  by the [place] FDA and has been authorized for detection and/or diagnosis of SARS-CoV-2  by FDA under an Emergency Use Authorization (EUA).  This EUA will remain in effect (meaning this test can be used) for the duration of the COVID-19 declaration under Section 564(b)(1) of the Act, 21 U.S.C. section 360bbb-3(b)(1), unless the authorization is terminated or revoked sooner. Performed at [facility], [address], [place], [place] [address]   Culture, blood (routine x 2)     Status: None (Preliminary result)   Collection Time: 02/23/20  5:17 AM   Specimen: BLOOD  Result Value Ref Range Status   Specimen Description BLOOD RIGHT  ANTECUBITAL  Final   Special Requests   Final    BOTTLES DRAWN AEROBIC AND ANAEROBIC Blood Culture results may not be optimal due to an inadequate volume of blood received in culture bottles   Culture   Final    NO GROWTH 2 DAYS Performed at [facility], [address], [place], [place] [address]    Report Status PENDING  Incomplete  Culture, blood (routine x 2)     Status: None (Preliminary result)   Collection Time: 02/23/20  2:30 PM   Specimen: BLOOD LEFT ARM  Result Value Ref Range Status   Specimen Description BLOOD LEFT ARM  Final   Special Requests   Final    BOTTLES DRAWN AEROBIC AND ANAEROBIC Blood Culture results may not be optimal due to an excessive volume of blood received in culture bottles   Culture   Final    NO GROWTH 2 DAYS Performed at [name], [address], [place], [place] [address]    Report Status PENDING  Incomplete  MRSA PCR Screening     Status: None   Collection Time: 02/24/20  9:29 AM   Specimen: Nasal Mucosa; Nasopharyngeal  Result Value Ref Range Status   MRSA by PCR NEGATIVE NEGATIVE Final    Comment:        The GeneXpert MRSA Assay (FDA approved for NASAL specimens only), is one component of a comprehensive MRSA colonization surveillance program. It is not intended to diagnose MRSA infection nor to guide or monitor treatment for MRSA infections. Performed at [facility], [address], [place], [place] [address]     Radiology Reports CT ANGIO CHEST PE W OR WO CONTRAST  Result Date: 02/23/2020 CLINICAL DATA:  Respiratory failure and possible sepsis. EXAM: CT ANGIOGRAPHY CHEST CT ABDOMEN AND PELVIS WITH CONTRAST TECHNIQUE: Multidetector CT imaging of the chest was performed using the standard protocol during bolus administration of intravenous contrast. Multiplanar CT image reconstructions and MIPs were obtained to evaluate the vascular anatomy. Multidetector CT imaging of the abdomen and pelvis was performed using  the standard protocol during bolus administration of intravenous contrast. CONTRAST:  OMNIPAQUE IOHEXOL 350 MG/ML SOLN COMPARISON:  Chest CT 09/11/2017 FINDINGS: CTA CHEST FINDINGS Cardiovascular: Satisfactory opacification of the pulmonary arteries to the segmental level. No evidence of pulmonary embolism. Normal heart size. No pericardial effusion. Mediastinum/Nodes: Negative for adenopathy or mass Lungs/Pleura: Low volume chest with streaky opacity in the lower lungs consistent with atelectasis or scarring, also noted on prior but more extensive today. Musculoskeletal: No acute finding Review of the MIP images confirms the above findings. CT ABDOMEN and PELVIS FINDINGS Hepatobiliary: Subcapsular low-density in the high right liver measuring 16 mm, stable from 2018 and thus considered benign.No evidence of biliary obstruction or stone. Pancreas: Unremarkable. Spleen: Unremarkable. Adrenals/Urinary Tract: Negative adrenals. No hydronephrosis or stone. Collapsed bladder around a Foley catheter. Stomach/Bowel: No obstruction. No  appendicitis or other visible bowel inflammation. Vascular/Lymphatic: No acute vascular abnormality. Mild atherosclerosis. No mass or adenopathy. Reproductive:No pathologic findings. Other: No ascites or pneumoperitoneum. Musculoskeletal: No acute abnormalities.  Chronic L5 pars defects Review of the MIP images confirms the above findings. IMPRESSION: Chest CTA: 1. Negative for pulmonary embolism. 2. Very low volume chest with atelectasis/scarring. Abdominal CT: No acute finding or explanation for history of sepsis. Electronically Signed   By: Marnee Spring M.D.   On: 02/23/2020 09:05   CT ABDOMEN PELVIS W CONTRAST  Result Date: 02/23/2020 CLINICAL DATA:  Respiratory failure and possible sepsis. EXAM: CT ANGIOGRAPHY CHEST CT ABDOMEN AND PELVIS WITH CONTRAST TECHNIQUE: Multidetector CT imaging of the chest was performed using the standard protocol during bolus administration of  intravenous contrast. Multiplanar CT image reconstructions and MIPs were obtained to evaluate the vascular anatomy. Multidetector CT imaging of the abdomen and pelvis was performed using the standard protocol during bolus administration of intravenous contrast. CONTRAST:  OMNIPAQUE IOHEXOL 350 MG/ML SOLN COMPARISON:  Chest CT 09/11/2017 FINDINGS: CTA CHEST FINDINGS Cardiovascular: Satisfactory opacification of the pulmonary arteries to the segmental level. No evidence of pulmonary embolism. Normal heart size. No pericardial effusion. Mediastinum/Nodes: Negative for adenopathy or mass Lungs/Pleura: Low volume chest with streaky opacity in the lower lungs consistent with atelectasis or scarring, also noted on prior but more extensive today. Musculoskeletal: No acute finding Review of the MIP images confirms the above findings. CT ABDOMEN and PELVIS FINDINGS Hepatobiliary: Subcapsular low-density in the high right liver measuring 16 mm, stable from 2018 and thus considered benign.No evidence of biliary obstruction or stone. Pancreas: Unremarkable. Spleen: Unremarkable. Adrenals/Urinary Tract: Negative adrenals. No hydronephrosis or stone. Collapsed bladder around a Foley catheter. Stomach/Bowel: No obstruction. No appendicitis or other visible bowel inflammation. Vascular/Lymphatic: No acute vascular abnormality. Mild atherosclerosis. No mass or adenopathy. Reproductive:No pathologic findings. Other: No ascites or pneumoperitoneum. Musculoskeletal: No acute abnormalities.  Chronic L5 pars defects Review of the MIP images confirms the above findings. IMPRESSION: Chest CTA: 1. Negative for pulmonary embolism. 2. Very low volume chest with atelectasis/scarring. Abdominal CT: No acute finding or explanation for history of sepsis. Electronically Signed   By: Marnee Spring M.D.   On: 02/23/2020 09:05   DG Chest Port 1 View  Result Date: 02/23/2020 CLINICAL DATA:  Sepsis EXAM: PORTABLE CHEST 1 VIEW COMPARISON:   07/19/2018 FINDINGS: Single frontal view of the chest demonstrates a stable cardiac silhouette. The lung volumes are diminished with subsegmental atelectasis or scarring at the lung bases. No airspace disease, effusion, or pneumothorax. No acute bony abnormalities. IMPRESSION: 1. Low lung volumes, no acute process. Electronically Signed   By: Sharlet Salina M.D.   On: 02/23/2020 01:44   ECHOCARDIOGRAM COMPLETE  Result Date: 02/24/2020    ECHOCARDIOGRAM REPORT   Patient Name:   Darrelle DARYON REMMERT Date of Exam: 02/24/2020 Medical Rec #:  [id number]       Height:       70.0 in Accession #:    [id number]      Weight:       205.0 lb Date of Birth:  1961/04/23       BSA:          2.109 m Patient Age:    58 years        BP:           92/62 mmHg Patient Gender: M               HR:  74 bpm. Exam Location:  Inpatient Procedure: 2D Echo Indications:    CHF-Acute I50.21  History:        Patient has prior history of Echocardiogram examinations, most                 recent 07/21/2018. Multiple Sclerosis.  Sonographer:    Mikki Santee RDCS (AE) Referring Phys: [id number] [place]  1. Left ventricular ejection fraction, by estimation, is 55 to 60%. The left ventricle has normal function. The left ventricle has no regional wall motion abnormalities. Left ventricular diastolic parameters were normal.  2. Right ventricular systolic function is normal. The right ventricular size is normal.  3. The mitral valve is normal in structure. No evidence of mitral valve regurgitation. No evidence of mitral stenosis.  4. The aortic valve is normal in structure. Aortic valve regurgitation is not visualized. No aortic stenosis is present.  5. The inferior vena cava is normal in size with greater than 50% respiratory variability, suggesting right atrial pressure of 3 mmHg. FINDINGS  Left Ventricle: Left ventricular ejection fraction, by estimation, is 55 to 60%. The left ventricle has normal function. The left  ventricle has no regional wall motion abnormalities. The left ventricular internal cavity size was normal in size. There is  no left ventricular hypertrophy. Left ventricular diastolic parameters were normal. Right Ventricle: The right ventricular size is normal. No increase in right ventricular wall thickness. Right ventricular systolic function is normal. Left Atrium: Left atrial size was normal in size. Right Atrium: Right atrial size was normal in size. Pericardium: There is no evidence of pericardial effusion. Mitral Valve: The mitral valve is normal in structure. Normal mobility of the mitral valve leaflets. No evidence of mitral valve regurgitation. No evidence of mitral valve stenosis. Tricuspid Valve: The tricuspid valve is normal in structure. Tricuspid valve regurgitation is not demonstrated. No evidence of tricuspid stenosis. Aortic Valve: The aortic valve is normal in structure. Aortic valve regurgitation is not visualized. No aortic stenosis is present. Pulmonic Valve: The pulmonic valve was normal in structure. Pulmonic valve regurgitation is not visualized. No evidence of pulmonic stenosis. Aorta: The aortic root is normal in size and structure. Venous: The inferior vena cava is normal in size with greater than 50% respiratory variability, suggesting right atrial pressure of 3 mmHg. IAS/Shunts: The interatrial septum was not well visualized.  LEFT VENTRICLE PLAX 2D LVIDd:         2.88 cm  Diastology LVIDs:         2.04 cm  LV e' lateral:   9.68 cm/s LV PW:         0.96 cm  LV E/e' lateral: 5.6 LV IVS:        1.00 cm  LV e' medial:    7.07 cm/s LVOT diam:     2.20 cm  LV E/e' medial:  7.7 LV SV:         56 LV SV Index:   27 LVOT Area:     3.80 cm  RIGHT VENTRICLE RV S prime:     9.68 cm/s LEFT ATRIUM             Index       RIGHT ATRIUM           Index LA diam:        3.10 cm 1.47 cm/m  RA Area:     12.80 cm LA Vol (A2C):   32.7 ml 15.50 ml/m RA Volume:  25.60 ml  12.14 ml/m LA Vol (A4C):   37.1  ml 17.59 ml/m LA Biplane Vol: 35.4 ml 16.78 ml/m  AORTIC VALVE LVOT Vmax:   73.90 cm/s LVOT Vmean:  46.900 cm/s LVOT VTI:    0.148 m  AORTA Ao Root diam: 3.50 cm MITRAL VALVE MV Area (PHT): 3.17 cm    SHUNTS MV Decel Time: 239 msec    Systemic VTI:  0.15 m MV E velocity: 54.50 cm/s  Systemic Diam: 2.20 cm MV A velocity: 60.70 cm/s MV E/A ratio:  0.90 Charlton Haws MD Electronically signed by Charlton Haws MD Signature Date/Time: 02/24/2020/4:17:01 PM    Final     Time Spent in minutes  30   Susa Raring M.D on 02/25/2020 at 8:43 AM  To page go to [email or web address] - password [facility]

## 2020-02-25 NOTE — Plan of Care (Signed)

## 2020-02-26 LAB — MAGNESIUM: Magnesium: 1.7 mg/dL (ref 1.7–2.4)

## 2020-02-26 LAB — COMPREHENSIVE METABOLIC PANEL
ALT: 20 U/L (ref 0–44)
AST: 29 U/L (ref 15–41)
Albumin: 2.6 g/dL — ABNORMAL LOW (ref 3.5–5.0)
Alkaline Phosphatase: 70 U/L (ref 38–126)
Anion gap: 7 (ref 5–15)
BUN: 11 mg/dL (ref 6–20)
CO2: 26 mmol/L (ref 22–32)
Calcium: 8.3 mg/dL — ABNORMAL LOW (ref 8.9–10.3)
Chloride: 107 mmol/L (ref 98–111)
Creatinine, Ser: 1 mg/dL (ref 0.61–1.24)
GFR calc Af Amer: >60 mL/min (ref >60)
GFR calc non Af Amer: >60 mL/min (ref >60)
Glucose, Bld: 85 mg/dL (ref 70–99)
Potassium: 3.8 mmol/L (ref 3.5–5.1)
Sodium: 140 mmol/L (ref 135–145)
Total Bilirubin: 1.3 mg/dL — ABNORMAL HIGH (ref 0.3–1.2)
Total Protein: 5.2 g/dL — ABNORMAL LOW (ref 6.5–8.1)

## 2020-02-26 LAB — CBC
HCT: 37 % — ABNORMAL LOW (ref 39.0–52.0)
Hemoglobin: 11.8 g/dL — ABNORMAL LOW (ref 13.0–17.0)
MCH: 30.3 pg (ref 26.0–34.0)
MCHC: 31.9 g/dL (ref 30.0–36.0)
MCV: 95.1 fL (ref 80.0–100.0)
Platelets: 148 10*3/uL — ABNORMAL LOW (ref 150–400)
RBC: 3.89 MIL/uL — ABNORMAL LOW (ref 4.22–5.81)
RDW: 14.1 % (ref 11.5–15.5)
WBC: 10.6 10*3/uL — ABNORMAL HIGH (ref 4.0–10.5)
nRBC: 0 % (ref 0.0–0.2)

## 2020-02-26 LAB — C-REACTIVE PROTEIN: CRP: 8.3 mg/dL — ABNORMAL HIGH (ref <1.0)

## 2020-02-26 LAB — BRAIN NATRIURETIC PEPTIDE: B Natriuretic Peptide: 135.9 pg/mL — ABNORMAL HIGH (ref 0.0–100.0)

## 2020-02-26 LAB — PROCALCITONIN: Procalcitonin: 11.1 ng/mL

## 2020-02-26 MED ORDER — POTASSIUM CHLORIDE CRYS ER 20 MEQ PO TBCR
20.0000 meq | EXTENDED_RELEASE_TABLET | Freq: Once | ORAL | Status: AC
  Administered 2020-02-26: 20 meq via ORAL
  Filled 2020-02-26: qty 1

## 2020-02-26 MED ORDER — PENICILLIN G POTASSIUM 5000000 UNITS IJ SOLR
2.0000 10*6.[IU] | INTRAVENOUS | Status: DC
  Administered 2020-02-26 – 2020-02-27 (×6): 2 10*6.[IU] via INTRAVENOUS
  Filled 2020-02-26 (×13): qty 2

## 2020-02-26 MED ORDER — WHITE PETROLATUM EX OINT
TOPICAL_OINTMENT | CUTANEOUS | Status: AC
  Filled 2020-02-26: qty 28.35

## 2020-02-26 MED ORDER — MAGNESIUM SULFATE IN D5W 1-5 GM/100ML-% IV SOLN
1.0000 g | Freq: Once | INTRAVENOUS | Status: AC
  Administered 2020-02-26: 1 g via INTRAVENOUS
  Filled 2020-02-26: qty 100

## 2020-02-26 MED ORDER — FOSFOMYCIN TROMETHAMINE 3 G PO PACK
3.0000 g | PACK | Freq: Once | ORAL | Status: AC
  Administered 2020-02-27: 3 g via ORAL
  Filled 2020-02-26: qty 3

## 2020-02-26 NOTE — Plan of Care (Signed)

## 2020-02-26 NOTE — TOC Initial Note (Addendum)
Transition of Care [facility]) - Initial/Assessment Note    Patient Details  Name: [name] MRN: [id number] Date of Birth: [birth year]  Transition of Care [facility]) CM/SW Contact:    Cherylann Parr, RN Phone Number: 02/26/2020, 12:04 PM  Clinical Narrative:       PTA from home with wife.  CM spoke with wife via phone, wife at pts bedside.  Pt choses to utilize his VA benefit for hospital bed and [facility] as recommended.  CM nformed VA of admit, CM also left VM with Lorenda Cahill with [facility] requesting callback related to [facility] and DME request at discharge from [facility].  Pts assigned CSW Dory Horn) with VA is off on Mondays    Update 3:48pm:  CM contacted by [place].  VA informed of hospital bed need and recommendation of HH.   Pt wife/pt interested in agencies covered by [facility] for [facility].  [facility] chosen- tentative referral given to [employer] awaiting orders and VA auth.      CSW completed urgent request form for hospital bed and faxed it back to [facility]         Expected Discharge Plan: Home w Home Health Services Barriers to Discharge: Continued Medical Work up   Patient Goals and CMS Choice Patient states their goals for this hospitalization and ongoing recovery are:: Pt did not specifiy a goal CMS Medicare.gov Compare Post Acute Care list provided to:: Patient Choice offered to / list presented to : Patient  Expected Discharge Plan and Services Expected Discharge Plan: Home w Home Health Services     Post Acute Care Choice: Home Health, Durable Medical Equipment Living arrangements for the past 2 months: Single Family Home                   DME Agency: [facility], [place]                  Prior Living Arrangements/Services Living arrangements for the past 2 months: Single Family Home Lives with:: Spouse Patient language and need for interpreter reviewed:: Yes Do you feel safe going back to the place where you live?: Yes      Need for Family Participation in Patient Care: Yes  (Comment) Care giver support system in place?: Yes (comment)   Criminal Activity/Legal Involvement Pertinent to Current Situation/Hospitalization: No - Comment as needed  Activities of Daily Living      Permission Sought/Granted   Permission granted to share information with : Yes, Verbal Permission Granted              Emotional Assessment       Orientation: : Oriented to Self, Oriented to Place, Oriented to  Time, Oriented to Situation      Admission diagnosis:  Sepsis due to Enterococcus (HCC) [A41.81] Sepsis, due to unspecified organism, unspecified whether acute organ dysfunction present [facility]) [A41.9] Patient Active Problem List   Diagnosis Date Noted  . Lactic acidosis 02/23/2020  . Sepsis due to Enterococcus (HCC) 02/23/2020  . Enterococcus UTI 02/23/2020  . Hyperkalemia 02/23/2020  . Penile bleeding 02/23/2020  . Acute respiratory failure with hypoxemia (HCC) 02/23/2020  . 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 Headache syndrome 12/22/2018  . 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 Abnormality of gait 11/21/2015  . Transient alteration of awareness 11/21/2015  . Meralgia paresthetica, tx by Dr. Sandria Manly 09/21/2012  . Neurogenic bladder 09/21/2012  . 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 AT [address] [place] [address] Phone: [phone] Fax: [phone]  [place], [place] [address] [place] [address] Phone: [phone] Fax: [place], [place] DEPT OF [address] [place] [address] Phone: [phone] Fax: [phone]     Social Determinants of Health (SDOH) Interventions    Readmission Risk Interventions No flowsheet data found.

## 2020-02-26 NOTE — Progress Notes (Signed)
Pharmacy Antibiotic Note  [name] is a 59 y.o. male admitted on 02/22/2020 with sepsis.  Pharmacy was consulted on 02/23/20 for vancomycin dosing.  Of note pt had recent urine cx at a non-[facility] growing Enterococcus faecalis and had been on ABX as outpt without improvement; d/w MD on 02/23/20, who ordered to broaden coverage given signs of sepsis, added ceftriaxone 02/23/20.   Today 02/26/20:  Antibiotic D#5 for enterococcus UTI,sepsi.  Vanc and ceftriaxone discontinued and  narrow abx to PCN 5/17.   Sepsis clinically resolved.  PCT 19.7>11.1,  CRP 12.9>8.3  afebrile, WBC 23.4>17.5>10.6k   Antimicrobials:  PCN 5/17>> Vanc 5/14>>5/17 CTX 5/14>>5/17 Fosfomycin 5/16 x1 (unsure why this was given today when pt on appropriate therapy with vanc already)  Microbiology: 4/26 Ucx (@[facility] ): E. Faecalis, only R to tetracyclines 5/13 Ucx ([facility]): E. Faecalis, pan sens 5/14 BCx;  ngtd x2 d 5/14 MRSA PCR: neg 5/15 MRSA PCR: neg   Plan: Vancomycin and Ceftriaxone discontinued.   Start Penicillin G 2 MU IV q4h - F/u renal fxn, C&S   Height: 5\' 10"  (177.8 cm) Weight: 93 kg (205 lb 0.4 oz) IBW/kg (Calculated) : 73  Temp (24hrs), Avg:98.1 F (36.7 C), Min:97.7 F (36.5 C), Max:98.4 F (36.9 C)  Recent Labs  Lab 02/22/20 2333 02/23/20 0450 02/23/20 0538 02/24/20 0713 02/24/20 1033 02/25/20 0531 02/26/20 0451  WBC 10.5 18.7*  --   --  23.4* 17.5* 10.6*  CREATININE 1.05 1.27*  --  1.09  --  1.00 1.00  LATICACIDVEN 2.2*  --  2.1* 1.8  --   --   --     Estimated Creatinine Clearance: 94.5 mL/min (by C-G formula based on SCr of 1 mg/dL).    No Known Allergies   Thank you for allowing pharmacy to be a part of this patient's care.  02/27/20, RPh Clinical Pharmacist Please see AMION for all pharmacy numbers 02/26/2020 8:56 AM

## 2020-02-26 NOTE — Progress Notes (Signed)
PROGRESS NOTE                                                                                                                                                                                                             Patient Demographics:    [name], is a 59 y.o. male, DOB - 05/03/1961, JXB:[id number]  Admit date - 02/22/2020   Admitting Physician Marinda Elk, MD  Outpatient Primary [facility] for the patient is [facility]  LOS - 3  Chief Complaint  Patient presents with  . Spasms       Brief Narrative  59 year old male with past medical history of secondary progressive multiple sclerosis, pseudobulbar affect, spastic quadriparesis, neurogenic bladder, asthma and depression who presents to [facility] emergency department due to progressively worsening generalized weakness, he was recently having problems with urinary retention and urologist Dr. Audria Nine had placed a Foley catheter 5 days ago, he was also diagnosed with UTI and prescribed an antibiotic which he could not start.  In the ER his presentation was suspicious for sepsis most likely due to UTI and he was admitted.   Subjective:   Patient in bed, appears comfortable, denies any headache, no fever, no chest pain or pressure, no shortness of breath , no abdominal pain. No focal weakness.   Assessment  & Plan :     1.  Sepsis with Enterococcus UTI with high procalcitonin.  CT chest abdomen pelvis unremarkable, UA looks stable, did have recent Foley placement 5 days ago and prior to that had urinary retention.  He was on Rocephin and Vancomycin empirically, switched to ampicillin on 02/26/2020, also received 1 dose of fosfomycin on 02/25/2020.  His sepsis pathophysiology has resolved and he clinically looks better.  Procalcitonin continues to trend down and blood cultures surprisingly have remained negative.  Continue to monitor, if remains stable likely discharge in the next 1 to 2 days.    2.   Advanced MS.  Bed bound, now complicated by urinary retention with Foley catheter along with dysphagia.  Supportive care, muscle relaxants to continue, soft diet, speech eval, continue Foley which was placed 5 days prior to admission.  3.  Urinary retention.  Urine #2 above.  Flomax and Foley to continue.  Follows with Dr. Audria Nine.  4.  Well demarcated 3 cm geographic macular lesions 1 on the left mid leg and 2 on the right mid leg, chronic per wife.  According to wife -from constant pressure irritation caused by bed  and wheelchair.  5.  Hypokalemia.  Replaced.   Condition - Extremely Guarded  Family Communication  : Wife [name] [phone]  on 02/25/2020, message left on 02/26/2020 at 11:48 AM  Code Status : DNR  Consults  :  None  Procedures  :   TTE   1. Left ventricular ejection fraction, by estimation, is 55 to 60%. The left ventricle has normal function. The left ventricle has no regional  wall motion abnormalities. Left ventricular diastolic parameters were normal.  2. Right ventricular systolic function is normal. The right ventricular size is normal.  3. The mitral valve is normal in structure. No evidence of mitral valve regurgitation. No evidence of mitral stenosis.  4. The aortic valve is normal in structure. Aortic valve regurgitation is not visualized. No aortic stenosis is present.  5. The inferior vena cava is normal in size with greater than 50% respiratory variability, suggesting right atrial pressure of 3 mmHg.    CTA chest, CT abdomen pelvis. Chest CTA: 1. Negative for pulmonary embolism. 2. Very low volume chest with atelectasis/scarring. Abdominal CT: No acute finding or explanation for history of sepsis  PUD Prophylaxis : None  Disposition Plan  :    Status is: Inpatient  Remains inpatient appropriate because:IV treatments appropriate due to intensity of illness or inability to take PO   Dispo: The patient is from: Home              Anticipated d/c  is to: Home              Anticipated d/c date is: > 3 days              Patient currently is not medically stable to d/c. IV ABX for Sepsis.    DVT Prophylaxis  :  Lovenox    Lab Results  Component Value Date   PLT 148 (L) 02/26/2020    Diet :  Diet Order            DIET SOFT Room service appropriate? No; Fluid consistency: Thin  Diet effective now               Inpatient Medications Scheduled Meds: . baclofen  20 mg Oral QHS  . baclofen  5 mg Oral BID WC  . Chlorhexidine Gluconate Cloth  6 each Topical Daily  . citalopram  40 mg Oral Daily  . Dextromethorphan-quiNIDine  1 capsule Oral BID  . enoxaparin (LOVENOX) injection  40 mg Subcutaneous Q24H  . [START ON 02/27/2020] fosfomycin  3 g Oral Once  . tamsulosin  0.8 mg Oral Daily   Continuous Infusions: . pencillin G potassium IV 2 Million Units (02/26/20 0941)   PRN Meds:.acetaminophen **OR** acetaminophen, morphine, [DISCONTINUED] ondansetron **OR** ondansetron (ZOFRAN) IV, polyethylene glycol  Antibiotics  :   Anti-infectives (From admission, onward)   Start     Dose/Rate Route Frequency Ordered Stop   02/27/20 0700  fosfomycin (MONUROL) packet 3 g     3 g Oral  Once 02/26/20 0719     02/26/20 0930  penicillin G potassium 2 Million Units in dextrose 5 % 50 mL IVPB     2 Million Units 100 mL/hr over 30 Minutes Intravenous Every 4 hours 02/26/20 0828     02/25/20 0930  fosfomycin (MONUROL) packet 3 g     3 g Oral  Once 02/25/20 0845 02/25/20 1036   02/23/20 1930  vancomycin (VANCOCIN) IVPB 1000 mg/200 mL premix  Status:  Discontinued  1,000 mg 200 mL/hr over 60 Minutes Intravenous Every 12 hours 02/23/20 0900 02/26/20 0719   02/23/20 0600  cefTRIAXone (ROCEPHIN) 1 g in sodium chloride 0.9 % 100 mL IVPB  Status:  Discontinued     1 g 200 mL/hr over 30 Minutes Intravenous Every 24 hours 02/23/20 0445 02/26/20 0719   02/23/20 0545  vancomycin (VANCOREADY) IVPB 2000 mg/400 mL     2,000 mg 200 mL/hr over 120  Minutes Intravenous  Once 02/23/20 0538 02/23/20 1207   02/23/20 0145  ampicillin (OMNIPEN) 2 g in sodium chloride 0.9 % 100 mL IVPB  Status:  Discontinued     2 g 300 mL/hr over 20 Minutes Intravenous Every 6 hours 02/23/20 0135 02/23/20 0445        Objective:   Vitals:   02/25/20 2333 02/26/20 0400 02/26/20 0802 02/26/20 0912  BP:  101/68 99/67 100/63  Pulse:  (!) 59 72 61  Resp:  Temp: 98.2 F (36.8 C) 97.7 F (36.5 C) 97.8 F (36.6 C) 97.9 F (36.6 C)  TempSrc: Oral Oral Oral Oral  SpO2:  97% 98% 93%  Weight:      Height:        SpO2: 93 % O2 Flow Rate (L/min): 2 L/min FiO2 (%): (!) 2 %  Wt Readings from Last 3 Encounters:  02/23/20 93 kg  09/05/18 93 kg  05/20/16 79.4 kg     Intake/Output Summary (Last 24 hours) at 02/26/2020 1145 Last data filed at 02/26/2020 0352 Gross per 24 hour  Intake 1680 ml  Output 350 ml  Net 1330 ml     Physical Exam  Awake Alert,  diffuse weakness lower extremity strength 1/5, upper extremity/3/5 left arm better than right, foley catheter in place, does have well-demarcated macular lesions 1 in the left mid leg and 2 in the right mid leg around 2 to 3 cm in diameter.  Will be monitored.  No open sores. [place].AT,PERRAL Supple Neck,No JVD, No cervical lymphadenopathy appriciated.  Symmetrical Chest wall movement, Good air movement bilaterally, CTAB RRR,No Gallops, Rubs or new Murmurs, No Parasternal Heave +ve B.Sounds, Abd Soft, No tenderness, No organomegaly appriciated, No rebound - guarding or rigidity. No Cyanosis    Data Review:    Recent Labs  Lab 02/22/20 2333 02/22/20 2333 02/23/20 0450 02/23/20 0604 02/24/20 1033 02/25/20 0531 02/26/20 0451  WBC 10.5  --  18.7*  --  23.4* 17.5* 10.6*  HGB 15.6   < > 14.0 12.9* 12.0* 11.6* 11.8*  HCT 48.2   < > 43.2 38.0* 37.6* 35.4* 37.0*  PLT 212  --  182  --  130* 144* 148*  MCV 96.0  --  95.6  --  96.9 95.9 95.1  MCH 31.1  --  31.0  --  30.9 31.4 30.3  MCHC 32.4   --  32.4  --  31.9 32.8 31.9  RDW 14.2  --  14.0  --  14.6 14.2 14.1  LYMPHSABS 0.5*  --  0.1*  --   --   --   --   MONOABS 0.1  --  0.7  --   --   --   --   EOSABS 0.0  --  0.0  --   --   --   --   BASOSABS 0.0  --  0.0  --   --   --   --    < > = values in this interval not displayed.  Recent Labs  Lab 02/22/20 2333 02/22/20 2333 02/23/20 0450 02/23/20 0604 02/23/20 1440 02/24/20 0713 02/24/20 1033 02/25/20 0531 02/26/20 0451  NA 142   < > 142 144  --  139  --  141 140  K 5.2*   < > 3.9 3.4*  --  4.4  --  3.4* 3.8  CL 107  --  109  --   --  109  --  108 107  CO2 22  --  25  --   --  19*  --  26 26  GLUCOSE 99  --  118*  --   --  78  --  77 85  BUN 20  --  18  --   --  22*  --  13 11  CREATININE 1.05  --  1.27*  --   --  1.09  --  1.00 1.00  CALCIUM 8.9  --  8.6*  --   --  8.2*  --  8.4* 8.3*  AST  --   --  26  --   --  42*  --  37 29  ALT  --   --  20  --   --  28  --  22 20  ALKPHOS  --   --  79  --   --  82  --  65 70  BILITOT  --   --  1.5*  --   --  1.7*  --  1.1 1.3*  ALBUMIN  --   --  3.4*  --   --  2.9*  --  2.5* 2.6*  MG  --   --   --   --   --  1.7  --  1.8 1.7  CRP  --   --   --   --   --   --   --  12.9* 8.3*  DDIMER  --   --   --   --   --   --  2.90*  --   --   PROCALCITON  --   --   --   --  38.33 16.02  --  19.76 11.10  INR  --   --   --   --  1.1  --   --   --   --   BNP  --   --   --   --   --   --  120.1* 119.5* 135.9*   < > = values in this interval not displayed.    Recent Labs  Lab 02/23/20 0423 02/23/20 1440 02/24/20 0713 02/24/20 1033 02/25/20 0531 02/26/20 0451  CRP  --   --   --   --  12.9* 8.3*  DDIMER  --   --   --  2.90*  --   --   BNP  --   --   --  120.1* 119.5* 135.9*  PROCALCITON  --  38.33 16.02  --  19.76 11.10  SARSCOV2NAA NEGATIVE  --   --   --   --   --     ------------------------------------------------------------------------------------------------------------------ No results for input(s): CHOL, HDL, LDLCALC, TRIG,  CHOLHDL, LDLDIRECT in the last 72 hours.  Lab Results  Component Value Date   HGBA1C 5.4 08/14/2015   ------------------------------------------------------------------------------------------------------------------ No results for input(s): TSH, T4TOTAL, T3FREE, THYROIDAB in the last 72 hours.  Invalid input(s): FREET3 ------------------------------------------------------------------------------------------------------------------ No results for input(s): VITAMINB12, FOLATE, FERRITIN, TIBC, IRON, RETICCTPCT in the last 72 hours.  Coagulation profile Recent Labs  Lab 02/23/20 1440  INR 1.1    Recent Labs    02/24/20 1033  DDIMER 2.90*    Cardiac Enzymes No results for input(s): CKMB, TROPONINI, MYOGLOBIN in the last 168 hours.  Invalid input(s): CK ------------------------------------------------------------------------------------------------------------------    Component Value Date/Time   BNP 135.9 (H) 02/26/2020 0451    Micro Results Recent Results (from the past 240 hour(s))  Urine Culture     Status: Abnormal   Collection Time: 02/22/20 11:33 PM   Specimen: Urine, Random  Result Value Ref Range Status   Specimen Description URINE, RANDOM  Final   Special Requests   Final    NONE Performed at [facility], [address] [place], [place] [address]    Culture >=100,000 COLONIES/mL ENTEROCOCCUS FAECALIS (A)  Final   Report Status 02/25/2020 FINAL  Final   Organism ID, Bacteria ENTEROCOCCUS FAECALIS (A)  Final      Susceptibility   Enterococcus faecalis - MIC*    AMPICILLIN <=2 SENSITIVE Sensitive     NITROFURANTOIN <=16 SENSITIVE Sensitive     VANCOMYCIN <=0.5 SENSITIVE Sensitive     * >=100,000 COLONIES/mL ENTEROCOCCUS FAECALIS  SARS Coronavirus 2 by RT PCR (hospital order, performed in [facility] hospital lab) Nasopharyngeal Nasopharyngeal Swab     Status: None   Collection Time: 02/23/20  4:23 AM   Specimen: Nasopharyngeal Swab  Result Value  Ref Range Status   SARS Coronavirus 2 NEGATIVE NEGATIVE Final    Comment: (NOTE) SARS-CoV-2 target nucleic acids are NOT DETECTED. The SARS-CoV-2 RNA is generally detectable in upper and lower respiratory specimens during the acute phase of infection. The lowest concentration of SARS-CoV-2 viral copies this assay can detect is 250 copies / mL. A negative result does not preclude SARS-CoV-2 infection and should not be used as the sole basis for treatment or other patient management decisions.  A negative result may occur with improper specimen collection / handling, submission of specimen other than nasopharyngeal swab, presence of viral mutation(s) within the areas targeted by this assay, and inadequate number of viral copies (<250 copies / mL). A negative result must be combined with clinical observations, patient history, and epidemiological information. Fact Sheet for Patients:   BoilerBrush.com.cy Fact Sheet for Healthcare Providers: https://pope.com/ This test is not yet approved or cleared  by the [place] FDA and has been authorized for detection and/or diagnosis of SARS-CoV-2 by FDA under an Emergency Use Authorization (EUA).  This EUA will remain in effect (meaning this test can be used) for the duration of the COVID-19 declaration under Section 564(b)(1) of the Act, 21 U.S.C. section 360bbb-3(b)(1), unless the authorization is terminated or revoked sooner. Performed at [facility], [address] [place], [place] [address]   Culture, blood (routine x 2)     Status: None (Preliminary result)   Collection Time: 02/23/20  5:17 AM   Specimen: BLOOD  Result Value Ref Range Status   Specimen Description BLOOD RIGHT ANTECUBITAL  Final   Special Requests   Final    BOTTLES DRAWN AEROBIC AND ANAEROBIC Blood Culture results may not be optimal due to an inadequate volume of blood received in culture bottles   Culture    Final    NO GROWTH 3 DAYS Performed at [facility], [address], [place], [place] [address]    Report Status PENDING  Incomplete  Culture, blood (routine x 2)     Status: None (Preliminary result)  Collection Time: 02/23/20  2:30 PM   Specimen: BLOOD LEFT ARM  Result Value Ref Range Status   Specimen Description BLOOD LEFT ARM  Final   Special Requests   Final    BOTTLES DRAWN AEROBIC AND ANAEROBIC Blood Culture results may not be optimal due to an excessive volume of blood received in culture bottles   Culture   Final    NO GROWTH 3 DAYS Performed at [facility], [address] [place], [place] [address]    Report Status PENDING  Incomplete  MRSA PCR Screening     Status: None   Collection Time: 02/24/20  9:29 AM   Specimen: Nasal Mucosa; Nasopharyngeal  Result Value Ref Range Status   MRSA by PCR NEGATIVE NEGATIVE Final    Comment:        The GeneXpert MRSA Assay (FDA approved for NASAL specimens only), is one component of a comprehensive MRSA colonization surveillance program. It is not intended to diagnose MRSA infection nor to guide or monitor treatment for MRSA infections. Performed at [facility], [address], [place], [place] [address]     Radiology Reports CT ANGIO CHEST PE W OR WO CONTRAST  Result Date: 02/23/2020 CLINICAL DATA:  Respiratory failure and possible sepsis. EXAM: CT ANGIOGRAPHY CHEST CT ABDOMEN AND PELVIS WITH CONTRAST TECHNIQUE: Multidetector CT imaging of the chest was performed using the standard protocol during bolus administration of intravenous contrast. Multiplanar CT image reconstructions and MIPs were obtained to evaluate the vascular anatomy. Multidetector CT imaging of the abdomen and pelvis was performed using the standard protocol during bolus administration of intravenous contrast. CONTRAST:  OMNIPAQUE IOHEXOL 350 MG/ML SOLN COMPARISON:  Chest CT 09/11/2017 FINDINGS: CTA CHEST FINDINGS Cardiovascular:  Satisfactory opacification of the pulmonary arteries to the segmental level. No evidence of pulmonary embolism. Normal heart size. No pericardial effusion. Mediastinum/Nodes: Negative for adenopathy or mass Lungs/Pleura: Low volume chest with streaky opacity in the lower lungs consistent with atelectasis or scarring, also noted on prior but more extensive today. Musculoskeletal: No acute finding Review of the MIP images confirms the above findings. CT ABDOMEN and PELVIS FINDINGS Hepatobiliary: Subcapsular low-density in the high right liver measuring 16 mm, stable from 2018 and thus considered benign.No evidence of biliary obstruction or stone. Pancreas: Unremarkable. Spleen: Unremarkable. Adrenals/Urinary Tract: Negative adrenals. No hydronephrosis or stone. Collapsed bladder around a Foley catheter. Stomach/Bowel: No obstruction. No appendicitis or other visible bowel inflammation. Vascular/Lymphatic: No acute vascular abnormality. Mild atherosclerosis. No mass or adenopathy. Reproductive:No pathologic findings. Other: No ascites or pneumoperitoneum. Musculoskeletal: No acute abnormalities.  Chronic L5 pars defects Review of the MIP images confirms the above findings. IMPRESSION: Chest CTA: 1. Negative for pulmonary embolism. 2. Very low volume chest with atelectasis/scarring. Abdominal CT: No acute finding or explanation for history of sepsis. Electronically Signed   By: Marnee Spring M.D.   On: 02/23/2020 09:05   CT ABDOMEN PELVIS W CONTRAST  Result Date: 02/23/2020 CLINICAL DATA:  Respiratory failure and possible sepsis. EXAM: CT ANGIOGRAPHY CHEST CT ABDOMEN AND PELVIS WITH CONTRAST TECHNIQUE: Multidetector CT imaging of the chest was performed using the standard protocol during bolus administration of intravenous contrast. Multiplanar CT image reconstructions and MIPs were obtained to evaluate the vascular anatomy. Multidetector CT imaging of the abdomen and pelvis was performed using the standard  protocol during bolus administration of intravenous contrast. CONTRAST:  OMNIPAQUE IOHEXOL 350 MG/ML SOLN COMPARISON:  Chest CT 09/11/2017 FINDINGS: CTA CHEST FINDINGS Cardiovascular: Satisfactory opacification  of the pulmonary arteries to the segmental level. No evidence of pulmonary embolism. Normal heart size. No pericardial effusion. Mediastinum/Nodes: Negative for adenopathy or mass Lungs/Pleura: Low volume chest with streaky opacity in the lower lungs consistent with atelectasis or scarring, also noted on prior but more extensive today. Musculoskeletal: No acute finding Review of the MIP images confirms the above findings. CT ABDOMEN and PELVIS FINDINGS Hepatobiliary: Subcapsular low-density in the high right liver measuring 16 mm, stable from 2018 and thus considered benign.No evidence of biliary obstruction or stone. Pancreas: Unremarkable. Spleen: Unremarkable. Adrenals/Urinary Tract: Negative adrenals. No hydronephrosis or stone. Collapsed bladder around a Foley catheter. Stomach/Bowel: No obstruction. No appendicitis or other visible bowel inflammation. Vascular/Lymphatic: No acute vascular abnormality. Mild atherosclerosis. No mass or adenopathy. Reproductive:No pathologic findings. Other: No ascites or pneumoperitoneum. Musculoskeletal: No acute abnormalities.  Chronic L5 pars defects Review of the MIP images confirms the above findings. IMPRESSION: Chest CTA: 1. Negative for pulmonary embolism. 2. Very low volume chest with atelectasis/scarring. Abdominal CT: No acute finding or explanation for history of sepsis. Electronically Signed   By: Marnee Spring M.D.   On: 02/23/2020 09:05   DG Chest Port 1 View  Result Date: 02/23/2020 CLINICAL DATA:  Sepsis EXAM: PORTABLE CHEST 1 VIEW COMPARISON:  07/19/2018 FINDINGS: Single frontal view of the chest demonstrates a stable cardiac silhouette. The lung volumes are diminished with subsegmental atelectasis or scarring at the lung bases. No airspace  disease, effusion, or pneumothorax. No acute bony abnormalities. IMPRESSION: 1. Low lung volumes, no acute process. Electronically Signed   By: Sharlet Salina M.D.   On: 02/23/2020 01:44   ECHOCARDIOGRAM COMPLETE  Result Date: 02/24/2020    ECHOCARDIOGRAM REPORT   Patient Name:   Saba TRAI ELLS Date of Exam: 02/24/2020 Medical Rec #:  [id number]       Height:       70.0 in Accession #:    [id number]      Weight:       205.0 lb Date of Birth:  1961-09-29       BSA:          2.109 m Patient Age:    58 years        BP:           92/62 mmHg Patient Gender: M               HR:           74 bpm. Exam Location:  Inpatient Procedure: 2D Echo Indications:    CHF-Acute I50.21  History:        Patient has prior history of Echocardiogram examinations, most                 recent 07/21/2018. Multiple Sclerosis.  Sonographer:    Thurman Coyer RDCS (AE) Referring Phys: [id number] Deno Lunger [facility] IMPRESSIONS  1. Left ventricular ejection fraction, by estimation, is 55 to 60%. The left ventricle has normal function. The left ventricle has no regional wall motion abnormalities. Left ventricular diastolic parameters were normal.  2. Right ventricular systolic function is normal. The right ventricular size is normal.  3. The mitral valve is normal in structure. No evidence of mitral valve regurgitation. No evidence of mitral stenosis.  4. The aortic valve is normal in structure. Aortic valve regurgitation is not visualized. No aortic stenosis is present.  5. The inferior vena cava is normal in size with greater than 50% respiratory variability, suggesting right atrial pressure of 3 mmHg.  FINDINGS  Left Ventricle: Left ventricular ejection fraction, by estimation, is 55 to 60%. The left ventricle has normal function. The left ventricle has no regional wall motion abnormalities. The left ventricular internal cavity size was normal in size. There is  no left ventricular hypertrophy. Left ventricular diastolic parameters were normal.  Right Ventricle: The right ventricular size is normal. No increase in right ventricular wall thickness. Right ventricular systolic function is normal. Left Atrium: Left atrial size was normal in size. Right Atrium: Right atrial size was normal in size. Pericardium: There is no evidence of pericardial effusion. Mitral Valve: The mitral valve is normal in structure. Normal mobility of the mitral valve leaflets. No evidence of mitral valve regurgitation. No evidence of mitral valve stenosis. Tricuspid Valve: The tricuspid valve is normal in structure. Tricuspid valve regurgitation is not demonstrated. No evidence of tricuspid stenosis. Aortic Valve: The aortic valve is normal in structure. Aortic valve regurgitation is not visualized. No aortic stenosis is present. Pulmonic Valve: The pulmonic valve was normal in structure. Pulmonic valve regurgitation is not visualized. No evidence of pulmonic stenosis. Aorta: The aortic root is normal in size and structure. Venous: The inferior vena cava is normal in size with greater than 50% respiratory variability, suggesting right atrial pressure of 3 mmHg. IAS/Shunts: The interatrial septum was not well visualized.  LEFT VENTRICLE PLAX 2D LVIDd:         2.88 cm  Diastology LVIDs:         2.04 cm  LV e' lateral:   9.68 cm/s LV PW:         0.96 cm  LV E/e' lateral: 5.6 LV IVS:        1.00 cm  LV e' medial:    7.07 cm/s LVOT diam:     2.20 cm  LV E/e' medial:  7.7 LV SV:         56 LV SV Index:   27 LVOT Area:     3.80 cm  RIGHT VENTRICLE RV S prime:     9.68 cm/s LEFT ATRIUM             Index       RIGHT ATRIUM           Index LA diam:        3.10 cm 1.47 cm/m  RA Area:     12.80 cm LA Vol (A2C):   32.7 ml 15.50 ml/m RA Volume:   25.60 ml  12.14 ml/m LA Vol (A4C):   37.1 ml 17.59 ml/m LA Biplane Vol: 35.4 ml 16.78 ml/m  AORTIC VALVE LVOT Vmax:   73.90 cm/s LVOT Vmean:  46.900 cm/s LVOT VTI:    0.148 m  AORTA Ao Root diam: 3.50 cm MITRAL VALVE MV Area (PHT): 3.17 cm     SHUNTS MV Decel Time: 239 msec    Systemic VTI:  0.15 m MV E velocity: 54.50 cm/s  Systemic Diam: 2.20 cm MV A velocity: 60.70 cm/s MV E/A ratio:  0.90 Jenkins Rouge MD Electronically signed by Jenkins Rouge MD Signature Date/Time: 02/24/2020/4:17:01 PM    Final     Time Spent in minutes  30   Lala Lund M.D on 02/26/2020 at 11:45 AM  To page go to [email or web address] - password [facility]

## 2020-02-26 NOTE — Progress Notes (Signed)
   02/26/20 0802  Assess: MEWS Score  Temp 97.8 F (36.6 C)  BP 99/67  Pulse Rate 72  ECG Heart Rate 72  Resp 13  SpO2 98 %  Assess: MEWS Score  MEWS Temp 0  MEWS Systolic 1  MEWS Pulse 0  MEWS RR 1  MEWS LOC 0  MEWS Score 2  MEWS Score Color Yellow  Assess: if the MEWS score is Yellow or Red  Were vital signs taken at a resting state? No  Focused Assessment Documented focused assessment  Early Detection of Sepsis Score *See Row Information* Medium  MEWS guidelines implemented *See Row Information* No, vital signs rechecked  VS repeated. Changed to [employer]

## 2020-02-27 LAB — COMPREHENSIVE METABOLIC PANEL
ALT: 22 U/L (ref 0–44)
AST: 26 U/L (ref 15–41)
Albumin: 2.8 g/dL — ABNORMAL LOW (ref 3.5–5.0)
Alkaline Phosphatase: 75 U/L (ref 38–126)
Anion gap: 11 (ref 5–15)
BUN: 10 mg/dL (ref 6–20)
CO2: 24 mmol/L (ref 22–32)
Calcium: 8.6 mg/dL — ABNORMAL LOW (ref 8.9–10.3)
Chloride: 103 mmol/L (ref 98–111)
Creatinine, Ser: 0.99 mg/dL (ref 0.61–1.24)
GFR calc Af Amer: >60 mL/min (ref >60)
GFR calc non Af Amer: >60 mL/min (ref >60)
Glucose, Bld: 88 mg/dL (ref 70–99)
Potassium: 4.1 mmol/L (ref 3.5–5.1)
Sodium: 138 mmol/L (ref 135–145)
Total Bilirubin: 1.4 mg/dL — ABNORMAL HIGH (ref 0.3–1.2)
Total Protein: 5.5 g/dL — ABNORMAL LOW (ref 6.5–8.1)

## 2020-02-27 LAB — C-REACTIVE PROTEIN: CRP: 4.6 mg/dL — ABNORMAL HIGH (ref <1.0)

## 2020-02-27 LAB — MAGNESIUM: Magnesium: 2 mg/dL (ref 1.7–2.4)

## 2020-02-27 LAB — CBC
HCT: 38.9 % — ABNORMAL LOW (ref 39.0–52.0)
Hemoglobin: 13 g/dL (ref 13.0–17.0)
MCH: 31.3 pg (ref 26.0–34.0)
MCHC: 33.4 g/dL (ref 30.0–36.0)
MCV: 93.7 fL (ref 80.0–100.0)
Platelets: 183 10*3/uL (ref 150–400)
RBC: 4.15 MIL/uL — ABNORMAL LOW (ref 4.22–5.81)
RDW: 13.9 % (ref 11.5–15.5)
WBC: 8.5 10*3/uL (ref 4.0–10.5)
nRBC: 0 % (ref 0.0–0.2)

## 2020-02-27 LAB — BRAIN NATRIURETIC PEPTIDE: B Natriuretic Peptide: 109.1 pg/mL — ABNORMAL HIGH (ref 0.0–100.0)

## 2020-02-27 LAB — PROCALCITONIN: Procalcitonin: 5.4 ng/mL

## 2020-02-27 MED ORDER — PENICILLIN V POTASSIUM 500 MG PO TABS: 500.0000 mg | ORAL_TABLET | Freq: Three times a day (TID) | ORAL | 0 refills | Status: DC

## 2020-02-27 MED ORDER — PENICILLIN V POTASSIUM 250 MG PO TABS
500.0000 mg | ORAL_TABLET | Freq: Three times a day (TID) | ORAL | Status: DC
  Administered 2020-02-27: 500 mg via ORAL
  Filled 2020-02-27 (×3): qty 2

## 2020-02-27 NOTE — TOC Transition Note (Addendum)
Transition of Care [facility]) - CM/SW Discharge Note   Patient Details  Name: [name] MRN: [id number] Date of Birth: 1961/03/09  Transition of Care [facility]) CM/SW Contact:  Cherylann Parr, RN Phone Number: 02/27/2020, 9:17 AM   Clinical Narrative:   Pt has discharge orders for home.  CM spoke with [facility] this am - hospital bed and HH likely to be approved by early am tomorrow. CM will discuss with pt and wife if they want to utilize the medicare benefit and switch to [place] when approved  Update: Wife is adamant about only using [facility] benefits.  CM had detailed discussion with pt's wife regarding [facility] process.  [facility] informed CM that hospital bed "should" be delivered by tomorrow am and the the [facility] request is in process but will likely be completed by tomorrow am as well.  CM faxed HH orders, H&P and therapy notes to [place], [place] confirms they have all required paperwork to for both DME and HH  Wife states she would like to go ahead and take pt home while DME and HH remain in process with the [place].   [facility] aware that pt will discharge home today prior to [place] - agency will follow pt and with start care once Berkley Harvey is received.  Wife aware that this is a barrier to [facility] for [facility]  Wife denied barriers with taking pt home today, pt has the following equipment in the home;  Wheelchair, overhead lift attached to the ceiling of the home, handicap accessible home.  Wife informed CM that she will pick up discharge meds from [facility].  Wife will transport pt home via private vehicle.    Discharge order written - CM signing off    Barriers to Discharge: Continued Medical Work up   Patient Goals and CMS Choice Patient states their goals for this hospitalization and ongoing recovery are:: Pt did not specifiy a goal CMS Medicare.gov Compare Post Acute Care list provided to:: Patient Choice offered to / list presented to : Patient  Discharge Placement                       Discharge Plan and Services     Post Acute Care  Choice: Home Health, Durable Medical Equipment            DME Agency: [facility] [place]                  Social Determinants of Health (SDOH) Interventions     Readmission Risk Interventions No flowsheet data found.

## 2020-02-27 NOTE — Progress Notes (Signed)
  Speech Language Pathology Treatment: Dysphagia  Patient Details Name: [name] MRN: [id number] DOB: [birth year] Today's Date: 02/27/2020 Time: 4471-5806 SLP Time Calculation (min) (ACUTE ONLY): 15 min  Assessment / Plan / Recommendation Clinical Impression  Pt consumed soft solids and thin liquids without overt s/s of aspiration. SLP provided assistance with feeding but pt implemented swallowing precautions with Mod I. He and his wife both verbalize that his swallowing has improved since initial evaluation last week, and they are hopeful that he can d/c home today. Recommend continuing current diet and precautions; acute SLP f/u no longer needed. Will sign off.   HPI HPI: 59 year old male with PMH of secondary progressive multiple sclerosis (diagnosed 2007), pseudobulbar affect, spastic quadriparesis, neurogenic bladder, asthma and depression presented to [facility] emergency department due to progressively worsening generalized weakness. Dx included lactic acidosis, UTI.  Pt participated in OP SLP 08/2017-11/2017 for treatment of dysarthria and dysphagia.  MBS in 08/03/14 revealed functional swallow with mild delays in mastication, good pharyngeal clearance and no aspiration; however, at the time pt described fatigue impacting function during meals.  Per pt and wife, pt's swallow has been functional.  He is able to eat a range of foods without difficulty.  He eats with certain precautions given his hx of dysphagia, but has been managing well without concerns for aspiration.       SLP Plan  All goals met       Recommendations  Diet recommendations: Dysphagia 3 (mechanical soft);Thin liquid Liquids provided via: Straw Medication Administration: Whole meds with liquid Supervision: Staff to assist with self feeding Compensations: Slow rate;Small sips/bites Postural Changes and/or Swallow Maneuvers: Seated upright 90 degrees                Oral Care Recommendations: Oral  care BID Follow up Recommendations: None SLP Visit Diagnosis: Dysphagia, unspecified (R13.10) Plan: All goals met       GO                Osie Bond., M.A. [facility] Pager [phone] Office [phone]  02/27/2020, 2:26 PM

## 2020-02-27 NOTE — Discharge Instructions (Signed)
Follow with Primary MD Administration, Veterans in 7 days   Get CBC, CMP  checked next visit within 1 week by Primary MD    Activity: As tolerated with Full fall precautions use walker/cane & assistance as needed  Disposition Home   Diet: Soft diet with feeding assistance and aspiration precautions.  Special Instructions: If you have smoked or chewed Tobacco  in the last 2 yrs please stop smoking, stop any regular Alcohol  and or any Recreational drug use.  On your next visit with your primary care physician please Get Medicines reviewed and adjusted.  Please request your Prim.MD to go over all Hospital Tests and Procedure/Radiological results at the follow up, please get all Hospital records sent to your Prim MD by signing hospital release before you go home.  If you experience worsening of your admission symptoms, develop shortness of breath, life threatening emergency, suicidal or homicidal thoughts you must seek medical attention immediately by calling 911 or calling your MD immediately  if symptoms less severe.  You Must read complete instructions/literature along with all the possible adverse reactions/side effects for all the Medicines you take and that have been prescribed to you. Take any new Medicines after you have completely understood and accpet all the possible adverse reactions/side effects.

## 2020-02-27 NOTE — Progress Notes (Signed)
Pharmacy Antibiotic Note  [name] is a 59 y.o. male admitted on 02/22/2020 and being treated for enterococcus UTI.  Pharmacy has been consulted to transition from IV to PO penicillin.  Plan: Penicillin 500mg  PO Q8H.  Recommend treatment x5d.  Height: 5\' 10"  (177.8 cm) Weight: 93.2 kg (205 lb 7.5 oz) IBW/kg (Calculated) : 73  Temp (24hrs), Avg:98.1 F (36.7 C), Min:97.7 F (36.5 C), Max:98.6 F (37 C)  Recent Labs  Lab 02/22/20 2333 02/22/20 2333 02/23/20 0450 02/23/20 0538 02/24/20 0713 02/24/20 1033 02/25/20 0531 02/26/20 0451 02/27/20 0444  WBC 10.5   < > 18.7*  --   --  23.4* 17.5* 10.6* 8.5  CREATININE 1.05   < > 1.27*  --  1.09  --  1.00 1.00 0.99  LATICACIDVEN 2.2*  --   --  2.1* 1.8  --   --   --   --    < > = values in this interval not displayed.    Estimated Creatinine Clearance: 95.6 mL/min (by C-G formula based on SCr of 0.99 mg/dL).    No Known Allergies   Thank you for allowing pharmacy to be a part of this patient's care.  02/28/20, PharmD, BCPS  02/27/2020 7:57 AM

## 2020-02-27 NOTE — Discharge Summary (Signed)
MATHEUS SPIKER ONG:[id number] DOB: 02/02/1961 DOA: 02/22/2020  [facility]P: [facility]  Admit date: 02/22/2020  Discharge date: 02/27/2020  Admitted From: Home   Disposition:  Home   Recommendations for Outpatient Follow-up:   Follow up with PCP in 1-2 weeks  PCP Please obtain BMP/CBC, 2 view CXR in 1week,  (see Discharge instructions)   PCP Please follow up on the following pending results:    Home Health: PT,OT,RN   Equipment/Devices: None  Consultations: None  Discharge Condition: Stable    CODE STATUS: Full    Diet Recommendation: Soft diet with feeding assistance & aspiration precautions  Diet Order            Diet - low sodium heart healthy        DIET SOFT Room service appropriate? No; Fluid consistency: Thin  Diet effective now               Chief Complaint  Patient presents with  . Spasms     Brief history of present illness from the day of admission and additional interim summary    59 year old male with past medical history of secondary progressive multiple sclerosis, pseudobulbar affect, spastic quadriparesis, neurogenic bladder, asthma and depression who presents to [facility] emergency department due to progressively worsening generalized weakness, he was recently having problems with urinary retention and urologist Dr. Audria Nine had placed a Foley catheter 5 days ago, he was also diagnosed with UTI and prescribed an antibiotic which he could not start.  In the ER his presentation was suspicious for sepsis most likely due to UTI and he was admitted.                                                                 Hospital Course     1.  Sepsis with Enterococcus UTI with high procalcitonin.  CT chest abdomen pelvis unremarkable, UA looks stable, did have recent Foley  placement 5 days ago and prior to that had urinary retention.  He was on Rocephin and Vancomycin empirically, switched to ampicillin on 02/26/2020, also received 1 dose of fosfomycin on 02/25/2020.  His sepsis pathophysiology has resolved and he clinically is back to his baseline, blood cultures have remained negative x3 days, will be placed on 6 more days of oral ampicillin with outpatient PCP and urology follow-up.   2.  Advanced MS.  Bed bound, now complicated by urinary retention with Foley catheter along with dysphagia.  Supportive care, muscle relaxants to continue, soft diet, speech eval, continue Foley which was placed 5 days prior to admission.  3.  Urinary retention.  Urine #2 above.  Flomax and Foley to continue.  Follow with Dr. Audria Nine in a week of discharge.  4.  Well demarcated 3 cm geographic macular lesions 1 on the left  mid leg and 2 on the right mid leg, chronic per wife.  According to wife -from constant pressure irritation caused by bed and wheelchair.  5.  Hypokalemia.  Replaced and stable.    Procedures  :  TTE   1. Left ventricular ejection fraction, by estimation, is 55 to 60%. The left ventricle has normal function. The left ventricle has no regional  wall motion abnormalities. Left ventricular diastolic parameters were normal.  2. Right ventricular systolic function is normal. The right ventricular size is normal.  3. The mitral valve is normal in structure. No evidence of mitral valve regurgitation. No evidence of mitral stenosis.  4. The aortic valve is normal in structure. Aortic valve regurgitation is not visualized. No aortic stenosis is present.  5. The inferior vena cava is normal in size with greater than 50% respiratory variability, suggesting right atrial pressure of 3 mmHg.    CTA chest, CT abdomen pelvis. Chest CTA: 1. Negative for pulmonary embolism. 2. Very low volume chest with atelectasis/scarring. Abdominal CT: No acute finding or  explanation for history of sepsis   Discharge diagnosis     Principal Problem:   Sepsis due to Enterococcus [facility]) Active Problems:   Multiple sclerosis, secondary progressive (HCC)   Neurogenic bladder   Spastic quadriparesis (HCC)   Lactic acidosis   Enterococcus UTI   Hyperkalemia   Penile bleeding   Acute respiratory failure with hypoxemia (HCC)    Discharge instructions    Discharge Instructions    Diet - low sodium heart healthy   Complete by: As directed    Discharge instructions   Complete by: As directed    Follow with Primary MD [facility] in 7 days   Get CBC, CMP  checked next visit within 1 week by Primary MD    Activity: As tolerated with Full fall precautions use walker/cane & assistance as needed  Disposition Home   Diet: Soft diet with feeding assistance and aspiration precautions.  Special Instructions: If you have smoked or chewed Tobacco  in the last 2 yrs please stop smoking, stop any regular Alcohol  and or any Recreational drug use.  On your next visit with your primary care physician please Get Medicines reviewed and adjusted.  Please request your Prim.MD to go over all Hospital Tests and Procedure/Radiological results at the follow up, please get all Hospital records sent to your Prim MD by signing hospital release before you go home.  If you experience worsening of your admission symptoms, develop shortness of breath, life threatening emergency, suicidal or homicidal thoughts you must seek medical attention immediately by calling 911 or calling your MD immediately  if symptoms less severe.  You Must read complete instructions/literature along with all the possible adverse reactions/side effects for all the Medicines you take and that have been prescribed to you. Take any new Medicines after you have completely understood and accpet all the possible adverse reactions/side effects.   Increase activity slowly   Complete by: As  directed       Discharge Medications   Allergies as of 02/27/2020   No Known Allergies     Medication List    TAKE these medications   albuterol 108 (90 Base) MCG/ACT inhaler Commonly known as: VENTOLIN HFA Inhale 2 puffs into the lungs every 6 (six) hours as needed. What changed: reasons to take this   baclofen 10 MG tablet Commonly known as: LIORESAL 1/2 tablet in the morning and at noon, and 2 tablets at night  What changed:   how much to take  how to take this  when to take this  additional instructions   citalopram 40 MG tablet Commonly known as: CeleXA Take 1 tablet (40 mg total) by mouth daily.   Dextromethorphan-quiNIDine 20-10 MG capsule Commonly known as: NUEDEXTA Take 1 tablet by mouth 2 (two) times daily.   ocrelizumab 600 mg in sodium chloride 0.9 % 500 mL Inject 600 mg into the vein every 6 (six) months.   penicillin v potassium 500 MG tablet Commonly known as: VEETID Take 1 tablet (500 mg total) by mouth every 8 (eight) hours.   tamsulosin 0.4 MG Caps capsule Commonly known as: FLOMAX Take 0.8 mg by mouth daily.            Durable Medical Equipment  (From admission, onward)         Start     Ordered   02/24/20 1143  For home use only DME Hospital bed  Once    Question Answer Comment  Length of Need Lifetime   Patient has (list medical condition): advanced MS   The above medical condition requires: Patient requires the ability to reposition frequently   Head must be elevated greater than: 30 degrees   Bed type Semi-electric   [employer] Yes   Trapeze Bar Yes   Support Surface: Gel Overlay      02/24/20 1143          Follow-up Information    [facility]. Schedule an appointment as soon as possible for a visit in 1 week(s).   Contact information: [address] [place] [address] [phone]        Jodelle Red, MD .   Specialty: Cardiology Contact information: [address] [place]  [address] [place] [address] [phone]        Alfredo Martinez, MD. Schedule an appointment as soon as possible for a visit in 1 week(s).   Specialty: Urology Why: Freddi Che information: [address] [place] [address] [phone]           Major procedures and Radiology Reports - PLEASE review detailed and final reports thoroughly  -       CT ANGIO CHEST PE W OR WO CONTRAST  Result Date: 02/23/2020 CLINICAL DATA:  Respiratory failure and possible sepsis. EXAM: CT ANGIOGRAPHY CHEST CT ABDOMEN AND PELVIS WITH CONTRAST TECHNIQUE: Multidetector CT imaging of the chest was performed using the standard protocol during bolus administration of intravenous contrast. Multiplanar CT image reconstructions and MIPs were obtained to evaluate the vascular anatomy. Multidetector CT imaging of the abdomen and pelvis was performed using the standard protocol during bolus administration of intravenous contrast. CONTRAST:  OMNIPAQUE IOHEXOL 350 MG/ML SOLN COMPARISON:  Chest CT 09/11/2017 FINDINGS: CTA CHEST FINDINGS Cardiovascular: Satisfactory opacification of the pulmonary arteries to the segmental level. No evidence of pulmonary embolism. Normal heart size. No pericardial effusion. Mediastinum/Nodes: Negative for adenopathy or mass Lungs/Pleura: Low volume chest with streaky opacity in the lower lungs consistent with atelectasis or scarring, also noted on prior but more extensive today. Musculoskeletal: No acute finding Review of the MIP images confirms the above findings. CT ABDOMEN and PELVIS FINDINGS Hepatobiliary: Subcapsular low-density in the high right liver measuring 16 mm, stable from 2018 and thus considered benign.No evidence of biliary obstruction or stone. Pancreas: Unremarkable. Spleen: Unremarkable. Adrenals/Urinary Tract: Negative adrenals. No hydronephrosis or stone. Collapsed bladder around a Foley catheter. Stomach/Bowel: No obstruction. No appendicitis or other visible  bowel inflammation. Vascular/Lymphatic:  No acute vascular abnormality. Mild atherosclerosis. No mass or adenopathy. Reproductive:No pathologic findings. Other: No ascites or pneumoperitoneum. Musculoskeletal: No acute abnormalities.  Chronic L5 pars defects Review of the MIP images confirms the above findings. IMPRESSION: Chest CTA: 1. Negative for pulmonary embolism. 2. Very low volume chest with atelectasis/scarring. Abdominal CT: No acute finding or explanation for history of sepsis. Electronically Signed   By: Marnee Spring M.D.   On: 02/23/2020 09:05   CT ABDOMEN PELVIS W CONTRAST  Result Date: 02/23/2020 CLINICAL DATA:  Respiratory failure and possible sepsis. EXAM: CT ANGIOGRAPHY CHEST CT ABDOMEN AND PELVIS WITH CONTRAST TECHNIQUE: Multidetector CT imaging of the chest was performed using the standard protocol during bolus administration of intravenous contrast. Multiplanar CT image reconstructions and MIPs were obtained to evaluate the vascular anatomy. Multidetector CT imaging of the abdomen and pelvis was performed using the standard protocol during bolus administration of intravenous contrast. CONTRAST:  OMNIPAQUE IOHEXOL 350 MG/ML SOLN COMPARISON:  Chest CT 09/11/2017 FINDINGS: CTA CHEST FINDINGS Cardiovascular: Satisfactory opacification of the pulmonary arteries to the segmental level. No evidence of pulmonary embolism. Normal heart size. No pericardial effusion. Mediastinum/Nodes: Negative for adenopathy or mass Lungs/Pleura: Low volume chest with streaky opacity in the lower lungs consistent with atelectasis or scarring, also noted on prior but more extensive today. Musculoskeletal: No acute finding Review of the MIP images confirms the above findings. CT ABDOMEN and PELVIS FINDINGS Hepatobiliary: Subcapsular low-density in the high right liver measuring 16 mm, stable from 2018 and thus considered benign.No evidence of biliary obstruction or stone. Pancreas: Unremarkable. Spleen:  Unremarkable. Adrenals/Urinary Tract: Negative adrenals. No hydronephrosis or stone. Collapsed bladder around a Foley catheter. Stomach/Bowel: No obstruction. No appendicitis or other visible bowel inflammation. Vascular/Lymphatic: No acute vascular abnormality. Mild atherosclerosis. No mass or adenopathy. Reproductive:No pathologic findings. Other: No ascites or pneumoperitoneum. Musculoskeletal: No acute abnormalities.  Chronic L5 pars defects Review of the MIP images confirms the above findings. IMPRESSION: Chest CTA: 1. Negative for pulmonary embolism. 2. Very low volume chest with atelectasis/scarring. Abdominal CT: No acute finding or explanation for history of sepsis. Electronically Signed   By: Marnee Spring M.D.   On: 02/23/2020 09:05   DG Chest Port 1 View  Result Date: 02/23/2020 CLINICAL DATA:  Sepsis EXAM: PORTABLE CHEST 1 VIEW COMPARISON:  07/19/2018 FINDINGS: Single frontal view of the chest demonstrates a stable cardiac silhouette. The lung volumes are diminished with subsegmental atelectasis or scarring at the lung bases. No airspace disease, effusion, or pneumothorax. No acute bony abnormalities. IMPRESSION: 1. Low lung volumes, no acute process. Electronically Signed   By: Sharlet Salina M.D.   On: 02/23/2020 01:44   ECHOCARDIOGRAM COMPLETE  Result Date: 02/24/2020    ECHOCARDIOGRAM REPORT   Patient Name:   Leopold AMARIE TARTE Date of Exam: 02/24/2020 Medical Rec #:  [id number]       Height:       70.0 in Accession #:    [id number]      Weight:       205.0 lb Date of Birth:  [birth year]       BSA:          2.109 m Patient Age:    58 years        BP:           92/62 mmHg Patient Gender: M               HR:           74  bpm. Exam Location:  Inpatient Procedure: 2D Echo Indications:    CHF-Acute I50.21  History:        Patient has prior history of Echocardiogram examinations, most                 recent 07/21/2018. Multiple Sclerosis.  Sonographer:    Thurman Coyer RDCS (AE) Referring Phys:  [id number] Deno Lunger [facility] IMPRESSIONS  1. Left ventricular ejection fraction, by estimation, is 55 to 60%. The left ventricle has normal function. The left ventricle has no regional wall motion abnormalities. Left ventricular diastolic parameters were normal.  2. Right ventricular systolic function is normal. The right ventricular size is normal.  3. The mitral valve is normal in structure. No evidence of mitral valve regurgitation. No evidence of mitral stenosis.  4. The aortic valve is normal in structure. Aortic valve regurgitation is not visualized. No aortic stenosis is present.  5. The inferior vena cava is normal in size with greater than 50% respiratory variability, suggesting right atrial pressure of 3 mmHg. FINDINGS  Left Ventricle: Left ventricular ejection fraction, by estimation, is 55 to 60%. The left ventricle has normal function. The left ventricle has no regional wall motion abnormalities. The left ventricular internal cavity size was normal in size. There is  no left ventricular hypertrophy. Left ventricular diastolic parameters were normal. Right Ventricle: The right ventricular size is normal. No increase in right ventricular wall thickness. Right ventricular systolic function is normal. Left Atrium: Left atrial size was normal in size. Right Atrium: Right atrial size was normal in size. Pericardium: There is no evidence of pericardial effusion. Mitral Valve: The mitral valve is normal in structure. Normal mobility of the mitral valve leaflets. No evidence of mitral valve regurgitation. No evidence of mitral valve stenosis. Tricuspid Valve: The tricuspid valve is normal in structure. Tricuspid valve regurgitation is not demonstrated. No evidence of tricuspid stenosis. Aortic Valve: The aortic valve is normal in structure. Aortic valve regurgitation is not visualized. No aortic stenosis is present. Pulmonic Valve: The pulmonic valve was normal in structure. Pulmonic valve regurgitation is not  visualized. No evidence of pulmonic stenosis. Aorta: The aortic root is normal in size and structure. Venous: The inferior vena cava is normal in size with greater than 50% respiratory variability, suggesting right atrial pressure of 3 mmHg. IAS/Shunts: The interatrial septum was not well visualized.  LEFT VENTRICLE PLAX 2D LVIDd:         2.88 cm  Diastology LVIDs:         2.04 cm  LV e' lateral:   9.68 cm/s LV PW:         0.96 cm  LV E/e' lateral: 5.6 LV IVS:        1.00 cm  LV e' medial:    7.07 cm/s LVOT diam:     2.20 cm  LV E/e' medial:  7.7 LV SV:         56 LV SV Index:   27 LVOT Area:     3.80 cm  RIGHT VENTRICLE RV S prime:     9.68 cm/s LEFT ATRIUM             Index       RIGHT ATRIUM           Index LA diam:        3.10 cm 1.47 cm/m  RA Area:     12.80 cm LA Vol (A2C):   32.7 ml 15.50 ml/m RA Volume:  25.60 ml  12.14 ml/m LA Vol (A4C):   37.1 ml 17.59 ml/m LA Biplane Vol: 35.4 ml 16.78 ml/m  AORTIC VALVE LVOT Vmax:   73.90 cm/s LVOT Vmean:  46.900 cm/s LVOT VTI:    0.148 m  AORTA Ao Root diam: 3.50 cm MITRAL VALVE MV Area (PHT): 3.17 cm    SHUNTS MV Decel Time: 239 msec    Systemic VTI:  0.15 m MV E velocity: 54.50 cm/s  Systemic Diam: 2.20 cm MV A velocity: 60.70 cm/s MV E/A ratio:  0.90 Charlton Haws MD Electronically signed by Charlton Haws MD Signature Date/Time: 02/24/2020/4:17:01 PM    Final     Micro Results    Recent Results (from the past 240 hour(s))  Urine Culture     Status: Abnormal   Collection Time: 02/22/20 11:33 PM   Specimen: Urine, Random  Result Value Ref Range Status   Specimen Description URINE, RANDOM  Final   Special Requests   Final    NONE Performed at [facility], [address] [place], [place] [address]    Culture >=100,000 COLONIES/mL ENTEROCOCCUS FAECALIS (A)  Final   Report Status 02/25/2020 FINAL  Final   Organism ID, Bacteria ENTEROCOCCUS FAECALIS (A)  Final      Susceptibility   Enterococcus faecalis - MIC*    AMPICILLIN <=2 SENSITIVE  Sensitive     NITROFURANTOIN <=16 SENSITIVE Sensitive     VANCOMYCIN <=0.5 SENSITIVE Sensitive     * >=100,000 COLONIES/mL ENTEROCOCCUS FAECALIS  SARS Coronavirus 2 by RT PCR (hospital order, performed in [facility] hospital lab) Nasopharyngeal Nasopharyngeal Swab     Status: None   Collection Time: 02/23/20  4:23 AM   Specimen: Nasopharyngeal Swab  Result Value Ref Range Status   SARS Coronavirus 2 NEGATIVE NEGATIVE Final    Comment: (NOTE) SARS-CoV-2 target nucleic acids are NOT DETECTED. The SARS-CoV-2 RNA is generally detectable in upper and lower respiratory specimens during the acute phase of infection. The lowest concentration of SARS-CoV-2 viral copies this assay can detect is 250 copies / mL. A negative result does not preclude SARS-CoV-2 infection and should not be used as the sole basis for treatment or other patient management decisions.  A negative result may occur with improper specimen collection / handling, submission of specimen other than nasopharyngeal swab, presence of viral mutation(s) within the areas targeted by this assay, and inadequate number of viral copies (<250 copies / mL). A negative result must be combined with clinical observations, patient history, and epidemiological information. Fact Sheet for Patients:   BoilerBrush.com.cy Fact Sheet for Healthcare Providers: https://pope.com/ This test is not yet approved or cleared  by the [place] FDA and has been authorized for detection and/or diagnosis of SARS-CoV-2 by FDA under an Emergency Use Authorization (EUA).  This EUA will remain in effect (meaning this test can be used) for the duration of the COVID-19 declaration under Section 564(b)(1) of the Act, 21 U.S.C. section 360bbb-3(b)(1), unless the authorization is terminated or revoked sooner. Performed at [facility], [address] [place], [place] [address]   Culture, blood (routine x 2)      Status: None (Preliminary result)   Collection Time: 02/23/20  5:17 AM   Specimen: BLOOD  Result Value Ref Range Status   Specimen Description BLOOD RIGHT ANTECUBITAL  Final   Special Requests   Final    BOTTLES DRAWN AEROBIC AND ANAEROBIC Blood Culture results may not be optimal due to an inadequate volume  of blood received in culture bottles   Culture   Final    NO GROWTH 4 DAYS Performed at [facility], [place] [address] [place], [place] [address]    Report Status PENDING  Incomplete  Culture, blood (routine x 2)     Status: None (Preliminary result)   Collection Time: 02/23/20  2:30 PM   Specimen: BLOOD LEFT ARM  Result Value Ref Range Status   Specimen Description BLOOD LEFT ARM  Final   Special Requests   Final    BOTTLES DRAWN AEROBIC AND ANAEROBIC Blood Culture results may not be optimal due to an excessive volume of blood received in culture bottles   Culture   Final    NO GROWTH 4 DAYS Performed at [facility], [place] [address] [place], [place] [address]    Report Status PENDING  Incomplete  MRSA PCR Screening     Status: None   Collection Time: 02/24/20  9:29 AM   Specimen: Nasal Mucosa; Nasopharyngeal  Result Value Ref Range Status   MRSA by PCR NEGATIVE NEGATIVE Final    Comment:        The GeneXpert MRSA Assay (FDA approved for NASAL specimens only), is one component of a comprehensive MRSA colonization surveillance program. It is not intended to diagnose MRSA infection nor to guide or monitor treatment for MRSA infections. Performed at [facility], [place] [address] [place], [place] [address]     Today   Subjective    Sohil Timko today has no headache,no chest abdominal pain,no new weakness tingling or numbness, feels much better wants to go home today.    Objective   Blood pressure 101/68, pulse 71, temperature 98.2 F (36.8 C), temperature source Axillary, resp. rate 16, height 5\' 10"  (1.778 m), weight 93.2 kg, SpO2 92  %.   Intake/Output Summary (Last 24 hours) at 02/27/2020 0915 Last data filed at 02/27/2020 6644 Gross per 24 hour  Intake 450 ml  Output 2930 ml  Net -2480 ml    Exam  Awake Alert, No new F.N deficits, diffuse generalized weakness with strength 0/5 in lower extremities and 1-2/5 in upper extremities with left arm stronger than the right [place].AT,PERRAL Supple Neck,No JVD, No cervical lymphadenopathy appriciated.  Symmetrical Chest wall movement, Good air movement bilaterally, CTAB RRR,No Gallops,Rubs or new Murmurs, No Parasternal Heave +ve B.Sounds, Abd Soft, Non tender, No organomegaly appriciated, No rebound -guarding or rigidity. No Cyanosis, foley in place   Data Review   CBC w Diff:  Lab Results  Component Value Date   WBC 8.5 02/27/2020   HGB 13.0 02/27/2020   HGB 16.5 12/27/2019   HCT 38.9 (L) 02/27/2020   HCT 48.0 12/27/2019   PLT 183 02/27/2020   PLT 202 12/27/2019   LYMPHOPCT 1 02/23/2020   MONOPCT 4 02/23/2020   EOSPCT 0 02/23/2020   BASOPCT 0 02/23/2020    CMP:  Lab Results  Component Value Date   NA 138 02/27/2020   NA 139 12/27/2019   K 4.1 02/27/2020   CL 103 02/27/2020   CO2 24 02/27/2020   BUN 10 02/27/2020   BUN 13 12/27/2019   CREATININE 0.99 02/27/2020   PROT 5.5 (L) 02/27/2020   PROT 6.4 12/27/2019   ALBUMIN 2.8 (L) 02/27/2020   ALBUMIN 4.6 12/27/2019   BILITOT 1.4 (H) 02/27/2020   BILITOT 0.8 12/27/2019   ALKPHOS 75 02/27/2020   AST 26 02/27/2020   ALT 22 02/27/2020  .  Total Time in preparing paper work, data evaluation and todays exam - 35 minutes  Susa Raring M.D on 02/27/2020 at 9:15 AM  [facility]   Office  [phone]

## 2020-02-27 NOTE — Progress Notes (Signed)
D.c instructions provided to wife and pt, they have no  Questions at this time. Pt was assisted with hoyer lift into w/c they use at home and pt was able to drive w/c out of room and to main entrance where his wife was parked to take him home.

## 2020-02-27 NOTE — Care Management Important Message (Signed)
Important Message  Patient Details  Name: [name] MRN: [id number] Date of Birth: 1961-09-13   Medicare Important Message Given:  Yes - Important Message mailed due to current National Emergency  Verbal consent obtained due to current National Emergency  Relationship to patient: Spouse/Significant Other Contact Name: Davinci Glotfelty Call Date: 02/27/20  Time: 0900 Phone: [phone] Outcome: Spoke with contact Important Message mailed to: Patient address on file    Orson Aloe 02/27/2020, 9:01 AM

## 2020-02-28 LAB — CULTURE, BLOOD (ROUTINE X 2)
Culture: NO GROWTH
Culture: NO GROWTH

## 2020-03-08 ENCOUNTER — Other Ambulatory Visit: Payer: Self-pay | Admitting: *Deleted

## 2020-03-08 NOTE — Patient Outreach (Signed)
[facility]) Care Management  03/08/2020  [name] April 17, 1961 [id number]   EMMI-general discharge D/c from [place]  RED ON EMMI ALERT Day # 4  Date: Sunday 03/03/20 1319 Red Alert Reason: Sad/hopeless/anxious/empty? Yes   Outreach attempt # 1 No answer at mobile nor home numbers. [facility] RN CM left HIPAA [facility] Portability and Accountability Act) compliant voicemail message along with CM's contact info t the home and mobile ([name]) numbers.   Plan: [facility] RN CM sent an unsuccessful outreach letter and scheduled this patient for another call attempt within 4 business days   [place] Zarazua L. Noelle Penner, RN, BSN, CCM [facility] Telephonic Care Management Care Coordinator Office number [phone] Mobile number [phone]  Main [facility] number [phone] Fax number [phone]

## 2020-03-14 ENCOUNTER — Ambulatory Visit: Payer: Self-pay | Admitting: *Deleted

## 2020-03-15 ENCOUNTER — Other Ambulatory Visit: Payer: Self-pay | Admitting: *Deleted

## 2020-03-15 NOTE — Patient Outreach (Signed)
[facility]) Care Management  03/15/2020  LAQUINN SHIPPY [birth year] [id number]   EMMI-general discharge D/c from [place]  RED ON EMMI ALERT Day # 4  Date: Sunday 03/03/20 1319 Red Alert Reason: Sad/hopeless/anxious/empty? Yes   Outreach attempt # 2 No answer at home number. [facility] RN CM left HIPAA [facility] Portability and Accountability Act) compliant voicemail message along with CM's contact info t the home number.   Plan: [facility] RN CM scheduled this patient for another call attempt within 4 business days   Shawntavia Saunders L. Noelle Penner, RN, BSN, CCM [facility] Telephonic Care Management Care Coordinator Office number [phone] Mobile number [phone]  Main [facility] number [phone] Fax number [phone]

## 2020-03-19 ENCOUNTER — Other Ambulatory Visit: Payer: Self-pay | Admitting: *Deleted

## 2020-03-19 ENCOUNTER — Encounter: Payer: Self-pay | Admitting: *Deleted

## 2020-03-19 NOTE — Patient Outreach (Signed)
[facility]) Care Management  03/19/2020  [name] Jul 14, 1961 [id number]   EMMI-general discharge D/c from [place] RED ON EMMI ALERT Day #4 Date:Sunday 03/03/20 1319 Red Alert Reason:Sad/hopeless/anxious/empty? Yes Insurance  Medicare, veterans administration Last admission 02/22/20 to 02/27/20 -d/c home with Home health [facility]) PT, OT, RN    Outreach attempt # 3  [facility] RN CM received a voice message left by [name] on 03/18/20 [facility] RN CM outreached to the home number he called from  No answerat home number. [facility] RN CM left HIPAA [facility] Portability and Accountability Act) compliant voicemail message along with CM's contact infot the home number.   [facility] RN CM was successful at reaching [name] at the mobile number listed for him Patient is able to verify HIPAA [facility] Portability and Accountability Act) identifiers, date of birth (DOB) and address Reviewed and addressed referral to [facility] ([facility] Customer service manager) with patient He provides permission for [facility] RN CM to speak with him and his wife, [name] via speaker phone    EMMI  [name] confirms the EMMI Red Alert Reason:"Sad, hopeless, anxious, empty? Yes"  Was incorrect  They confirm no symptoms of sadness, hopelessness, empty nor anxious   They state he is doing well and continues to work with home health RN, PT, OT [facility] RN CM reviewed availability of [facility] SW for any possible future symptoms of sadness, hopelessness, empty or anxious They deny the need for [facility] SW services  He is receiving Celexa  Social  [name] is a 59 year old disabled veteran who lives at home with his wife [name] and chldren (2)He has support of his family for care needs to include transportation. 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 1 on the left mid leg and 2 on the right mid leg,  secondary progressive multiple sclerosis, pseudobulbar affect, spastic quadriparesis, neurogenic bladder, asthma, depression,urinary retention, urinary tract infection (UTI), hypokalemia, neurogenic bladder, spastic quadriparesis, lactic acidosis, penile bleeding, acute respiratory failure with hypoxemia, former smoker   DME hospital bed Medications, ramp manual wheelchair tub bench, power lift to get in bathroom, bedroom   Plan: [facility] RN CM scheduled this patient for another outreach within 30-35 days business days as agreed Pt encouraged to return a call to [facility] RN CM prn [facility] RN CM sent a Science writer with [employer] brochure, [employer], [facility] consent form with return envelope and know before you go sheet enclosed for review MD involvement barriers letter sent   Routed note to MDs/NP/PA   [facility] CM Care Plan Problem One     Most Recent Value  Care Plan Problem One  Knowledge deficit of home care for MS  Role Documenting the Problem One  Care Management Telephonic Coordinator  Care Plan for Problem One  Active  [facility] Long Term Goal   over the next 90 days patient will be able to verbalize with outreach interventions to manage MS  [facility] Long Term Goal Start Date  03/19/20  Interventions for Problem One Long Term Goal  EMMI assessment completed Offered [facility] SW services, support offered  [facility] CM Short Term Goal #1   Patient will be able to verbalize in the next 14 days  3 signs & symptoms to report to MD for increased urinary symptoms (UTI)  [facility] CM Short Term Goal #1 Start Date  03/19/20  Interventions for Short Term  Goal #1  EMMI assessment completed Offered [facility] SW services, support offered      [place] L. Lavina Hamman, RN, BSN, [place] Coordinator Office number [phone] Mobile number [phone]  Main [facility] number [phone] Fax number [phone]

## 2020-03-20 ENCOUNTER — Ambulatory Visit: Payer: Self-pay | Admitting: *Deleted

## 2020-04-17 ENCOUNTER — Ambulatory Visit: Payer: Self-pay | Admitting: *Deleted

## 2020-05-01 ENCOUNTER — Ambulatory Visit: Payer: Self-pay | Admitting: *Deleted

## 2020-05-10 ENCOUNTER — Ambulatory Visit: Payer: Self-pay | Admitting: *Deleted

## 2020-06-05 DIAGNOSIS — R309 Painful micturition, unspecified: Secondary | ICD-10-CM | POA: Diagnosis not present

## 2020-06-29 DIAGNOSIS — Z23 Encounter for immunization: Secondary | ICD-10-CM | POA: Diagnosis not present

## 2020-07-01 NOTE — Progress Notes (Signed)
PATIENT: [name] DOB: [birth year]  REASON FOR VISIT: follow up HISTORY FROM: patient  HISTORY OF PRESENT ILLNESS: Today 07/02/20 [name] is a 59 year old male with history of multiple sclerosis associated with quadriparesis, he is nonambulatory.  He has pseudobulbar affect.  He is on Ocrevus, baclofen, and Nuedexta. [facility] from Celex[facility] to Wellbutrin, due to potential interaction with Nuedexta. He was hospitalized in May 2021 for sepsis secondary to UTI, he had urinary retention with Foley catheter placement, now has chronic Foley.  MRI of the brain in September 2020 was stable from 2015 with moderate involvement of periventricular plaques.  MRI cervical spine was motion degraded, in the past he has had spinal cord plaques that are likely leading to progressive disability. Last Ocrevus was in June, given at [place]. does not feel Ocrevus is "pumping him up" as well, since starting in 2018. He has progressive weakness of the right arm, at this point almost 100% usless, hand in flexion, wearing splint. Feels his speech is slower, more labored, cognitive cloudiness. Has had handle for electric wheelchair switch to the left. [facility] has been [facility] him. He is at home with his wife, [name]. A nurse comes to change the catheter. He has 2 sisters with MS, 1 was on Tysabri, developed PML, is in nursing home, the other is on Ocrevus and is doing well.   HISTORY 12/27/2019 SS: [name] is a 59 year old male with history of multiple sclerosis associated with quadriparesis, he is not ambulatory.  He has seen a bulbar affect, is on medication with good improvement.  He is currently on Ocrevus.  He has double vision, will have ophthalmology evaluation next week.  He had MRI of the brain and cervical spine in September 2020, was stable from 2015, moderate involvement with periventricular plaques seen, MRI of the cervical spine was motion degraded.  He seems to do well with Ocrevus,  next infusion is June 18.  He has progressive weakness of the right arm.  He denies any new problems or concerns.  He presents today accompanied by his wife.   REVIEW OF SYSTEMS: Out of a complete 14 system review of symptoms, the patient complains only of the following symptoms, and all other reviewed systems are negative.  See HPI  ALLERGIES: No Known Allergies  HOME MEDICATIONS: Outpatient Medications Prior to Visit  Medication Sig Dispense Refill  . albuterol (PROVENTIL HFA;VENTOLIN HFA) 108 (90 Base) MCG/ACT inhaler Inhale 2 puffs into the lungs every 6 (six) hours as needed. (Patient taking differently: Inhale 2 puffs into the lungs every 6 (six) hours as needed for wheezing or shortness of breath. ) 1 Inhaler 0  . baclofen (LIORESAL) 10 MG tablet 1/2 tablet in the morning and at noon, and 2 tablets at night (Patient taking differently: Take 2.5-10 mg by mouth See admin instructions. Take 1/2 tablet in the morning and at noon, and take 2 tablets at night) 270 each 3  . buPROPion (WELLBUTRIN XL) 300 MG 24 hr tablet Take 300 mg by mouth daily.    Marland Kitchen Dextromethorphan-Quinidine 20-10 MG CAPS Take 1 tablet by mouth 2 (two) times daily. 60 capsule 3  . GABAPENTIN PO Take by mouth.    Marland Kitchen ibuprofen (ADVIL) 200 MG tablet Take 200 mg by mouth every 6 (six) hours as needed.    Marland Kitchen ocrelizumab 600 mg in sodium chloride 0.9 % 500 mL Inject 600 mg into the vein every 6 (six) months.     Marland Kitchen  citalopram (CELEXA) 40 MG tablet Take 1 tablet (40 mg total) by mouth daily. 90 tablet 1  . penicillin v potassium (VEETID) 500 MG tablet Take 1 tablet (500 mg total) by mouth every 8 (eight) hours. 18 tablet 0  . tamsulosin (FLOMAX) 0.4 MG CAPS capsule Take 0.8 mg by mouth daily.     No facility-administered medications prior to visit.    PAST MEDICAL HISTORY: Past Medical History:  Diagnosis Date  . Abnormality of gait 11/21/2015  . Asthma    childhood asthma  . Classic migraine   . Depression   . Dysphagia    . Hay fever   . Headache syndrome 12/22/2018  . MS (multiple sclerosis) (HCC)   . 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 eye surgeries     x 2; bilateral 74 and 76    FAMILY HISTORY: Family History  Problem Relation Age of Onset  . Cancer Father   . Multiple sclerosis Sister   . Seizures Maternal Uncle   . Parkinsonism Maternal Uncle   . Multiple sclerosis Sister   . Multiple sclerosis Paternal Uncle   . Multiple sclerosis Other   . Lung cancer Other        parent  . Uterine cancer Other        other    SOCIAL HISTORY: Social History   Socioeconomic History  . Marital status: Married    Spouse name: [name]  . Number of children: 2  . Years of education: college  . Highest education level: Not on file  Occupational History  . Occupation: disabled  Tobacco Use  . Smoking status: Former Games developer  . Smokeless tobacco: Never Used  Substance and Sexual Activity  . Alcohol use: No    Comment: rare   . Drug use: No  . Sexual activity: Not on file  Other Topics Concern  . Not on file  Social History Narrative   Lives at home, married   Patient is right handed.   Patient drinks 1 cup caffeine daily.   Social Determinants of Health   Financial Resource Strain:   . Difficulty of Paying Living Expenses: Not on file  Food Insecurity:   . Worried About Programme researcher, broadcasting/film/video in the Last Year: Not on file  . Ran Out of Food in the Last Year: Not on file  Transportation Needs:   . Lack of Transportation (Medical): Not on file  . Lack of Transportation (Non-Medical): Not on file  Physical Activity:   . Days of Exercise per Week: Not on file  . Minutes of Exercise per Session: Not on file  Stress:   . Feeling of Stress : Not on file  Social Connections:   . Frequency of Communication with Friends and Family: Not on file  . Frequency of Social Gatherings with Friends and Family: Not on file  . Attends  Religious Services: Not on file  . Active Member of Clubs or Organizations: Not on file  . Attends Banker Meetings: Not on file  . Marital Status: Not on file  Intimate Partner Violence:   . Fear of Current or Ex-Partner: Not on file  . Emotionally Abused: Not on file  . Physically Abused: Not on file  . Sexually Abused: Not on file   PHYSICAL EXAM  Vitals:   07/02/20 1243  BP: 115/76  Pulse: 66   There is no height or weight on file  to calculate BMI.  Generalized: Well developed, in no acute distress   Neurological examination  Mentation: Alert oriented to time, place, history taking. Follows all commands speech is low slow, soft  Cranial nerve II-XII: Pupils were equal round reactive to light. Bila. teral INO and appears rotary nystagmus, facial sensation and strength were normal. Decreased right shoulder shrug Motor: Very limited mobility of the right upper extremity, left upper extremity is nearly normal, no movement of lower extremities, increased motor tone lower extremities Sensory: Decreased sensation to bilateral lower extremities, right upper extremity, sensation to the face and left upper extremity is intact Coordination: Can perform finger-nose-finger on the left, but there is mild dysmetria.  Gait and station: Nonambulatory Reflexes: Deep tendon reflexes are symmetric   DIAGNOSTIC DATA (LABS, IMAGING, TESTING) - I reviewed patient records, labs, notes, testing and imaging myself where available.  Lab Results  Component Value Date   WBC 8.5 02/27/2020   HGB 13.0 02/27/2020   HCT 38.9 (L) 02/27/2020   MCV 93.7 02/27/2020   PLT 183 02/27/2020      Component Value Date/Time   NA 138 02/27/2020 0444   NA 139 12/27/2019 1146   K 4.1 02/27/2020 0444   CL 103 02/27/2020 0444   CO2 24 02/27/2020 0444   GLUCOSE 88 02/27/2020 0444   BUN 10 02/27/2020 0444   BUN 13 12/27/2019 1146   CREATININE 0.99 02/27/2020 0444   CALCIUM 8.6 (L) 02/27/2020 0444    PROT 5.5 (L) 02/27/2020 0444   PROT 6.4 12/27/2019 1146   ALBUMIN 2.8 (L) 02/27/2020 0444   ALBUMIN 4.6 12/27/2019 1146   AST 26 02/27/2020 0444   ALT 22 02/27/2020 0444   ALKPHOS 75 02/27/2020 0444   BILITOT 1.4 (H) 02/27/2020 0444   BILITOT 0.8 12/27/2019 1146   GFRNONAA >60 02/27/2020 0444   GFRAA >60 02/27/2020 0444   Lab Results  Component Value Date   CHOL 234 (H) 08/14/2015   HDL 47.80 08/14/2015   LDLCALC 163 (H) 08/14/2015   LDLDIRECT 192.1 08/04/2012   TRIG 116.0 08/14/2015   CHOLHDL 5 08/14/2015   Lab Results  Component Value Date   HGBA1C 5.4 08/14/2015   No results found for: VITAMINB12 Lab Results  Component Value Date   TSH 0.82 07/19/2018      ASSESSMENT AND PLAN 59 y.o. year old male  has a past medical history of Abnormality of gait (11/21/2015), Asthma, Classic migraine, Depression, Dysphagia, Hay fever, Headache syndrome (12/22/2018), MS (multiple sclerosis) (HCC), and Pseudobulbar affect (05/27/2017). here with:  1. Multiple sclerosis, secondary progressive 2. Spastic quadriparesis 3. Pseudobulbar affect  He has been on Ocrevus since 2018, last infusion was in June 2021, through the [place]. he does not feel he is having much response, continues to decline (progressive weakness of the right arm, speech and cognitive decline), consider other options for medication management? Will discuss with Dr. Anne Hahn, call the patient next week, Ocrevus is a fairly aggressive medication, he appears to have a more progressive MS process. Will check CBC, CMP today. He receives his medications through the [place]. He will follow-up in 6 months or sooner if needed.  I spent 30 minutes of face-to-face and non-face-to-face time with patient.  This included previsit chart review, lab review, study review, order entry, electronic health record documentation, patient education.  Margie Ege, AGNP-C, DNP 07/02/2020, 1:48 PM [facility] [address] [place], [place] [address] [phone]

## 2020-07-02 ENCOUNTER — Encounter: Payer: Self-pay | Admitting: Neurology

## 2020-07-02 ENCOUNTER — Ambulatory Visit (INDEPENDENT_AMBULATORY_CARE_PROVIDER_SITE_OTHER): Payer: Medicare Other | Admitting: Neurology

## 2020-07-02 ENCOUNTER — Other Ambulatory Visit: Payer: Self-pay

## 2020-07-02 VITALS — BP 115/76 | HR 66

## 2020-07-02 DIAGNOSIS — G35 Multiple sclerosis: Secondary | ICD-10-CM

## 2020-07-02 DIAGNOSIS — F482 Pseudobulbar affect: Secondary | ICD-10-CM

## 2020-07-02 NOTE — Patient Instructions (Signed)
Continue current medications Check blood work today  Will discuss with Dr. Anne Hahn about your plan going forward  See you back in 6 months

## 2020-07-03 LAB — COMPREHENSIVE METABOLIC PANEL
ALT: 14 IU/L (ref 0–44)
AST: 13 IU/L (ref 0–40)
Albumin/Globulin Ratio: 2.3 — ABNORMAL HIGH (ref 1.2–2.2)
Albumin: 4.5 g/dL (ref 3.8–4.9)
Alkaline Phosphatase: 124 IU/L — ABNORMAL HIGH (ref 44–121)
BUN/Creatinine Ratio: 16 (ref 9–20)
BUN: 16 mg/dL (ref 6–24)
Bilirubin Total: 0.7 mg/dL (ref 0.0–1.2)
CO2: 25 mmol/L (ref 20–29)
Calcium: 9.2 mg/dL (ref 8.7–10.2)
Chloride: 101 mmol/L (ref 96–106)
Creatinine, Ser: 0.99 mg/dL (ref 0.76–1.27)
GFR calc Af Amer: 97 mL/min/{1.73_m2} (ref >59)
GFR calc non Af Amer: 84 mL/min/{1.73_m2} (ref >59)
Globulin, Total: 2 g/dL (ref 1.5–4.5)
Glucose: 73 mg/dL (ref 65–99)
Potassium: 4.1 mmol/L (ref 3.5–5.2)
Sodium: 139 mmol/L (ref 134–144)
Total Protein: 6.5 g/dL (ref 6.0–8.5)

## 2020-07-03 LAB — CBC WITH DIFFERENTIAL/PLATELET
Basophils Absolute: 0.1 10*3/uL (ref 0.0–0.2)
Basos: 1 %
EOS (ABSOLUTE): 0.2 10*3/uL (ref 0.0–0.4)
Eos: 3 %
Hematocrit: 46 % (ref 37.5–51.0)
Hemoglobin: 15.3 g/dL (ref 13.0–17.7)
Immature Grans (Abs): 0 10*3/uL (ref 0.0–0.1)
Immature Granulocytes: 0 %
Lymphocytes Absolute: 0.6 10*3/uL — ABNORMAL LOW (ref 0.7–3.1)
Lymphs: 10 %
MCH: 30 pg (ref 26.6–33.0)
MCHC: 33.3 g/dL (ref 31.5–35.7)
MCV: 90 fL (ref 79–97)
Monocytes Absolute: 0.8 10*3/uL (ref 0.1–0.9)
Monocytes: 14 %
Neutrophils Absolute: 4.3 10*3/uL (ref 1.4–7.0)
Neutrophils: 72 %
Platelets: 231 10*3/uL (ref 150–450)
RBC: 5.1 x10E6/uL (ref 4.14–5.80)
RDW: 13.3 % (ref 11.6–15.4)
WBC: 5.9 10*3/uL (ref 3.4–10.8)

## 2020-07-03 NOTE — Progress Notes (Signed)
I have read the note, and I agree with the clinical assessment and plan.  [name] [name]   

## 2020-07-04 ENCOUNTER — Other Ambulatory Visit: Payer: Self-pay

## 2020-07-04 ENCOUNTER — Other Ambulatory Visit: Payer: Self-pay | Admitting: *Deleted

## 2020-07-04 ENCOUNTER — Encounter: Payer: Self-pay | Admitting: *Deleted

## 2020-07-04 NOTE — Patient Outreach (Signed)
[facility]) Care Management  07/04/2020  TLALOC TADDEI [birth year] [id number]   [facility] follow up and case closure for EMMI  Mr Kiptyn Rafuse was referred to [facility] for EMMI Red Alert Reason:Sad/hopeless/anxious/empty? Yes Insurance  Medicare, veterans administration Last admission 02/22/20 to 02/27/20 -d/c home with Home health [facility]) PT, OT, RN  [facility] RN CM was successful at reaching Mr Lazare at the mobile number listed for him Patient is able to verify HIPAA ([place]) identifiers, date of birth (DOB) and address Reviewed and addressed referral to [facility]) with patient He provides permission for [facility] RN CM to speak with him and his wife, [name] via speaker phone   Follow up  Mr Chiquita Loth confirms he has been doing very well He denies any medical or social concerns at this time with assessment  [facility] RN CM assessed for need for disease management  [facility] RN CM discussed future [facility] outreach availability Mr & Mrs Markevious Ehmke prefer case closure as they report they are doing very well at this time   Plans Case closure per pt agreement Letters to pt and pcp [facility] CM Care Plan Problem One     Most Recent Value  Care Plan Problem One Knowledge deficit of home care for MS  Role Documenting the Problem One Care Management Telephonic Coordinator  Care Plan for Problem One Active  [facility] Long Term Goal  over the next 90 days patient will be able to verbalize with outreach interventions to manage MS  [facility] Long Term Goal Start Date 03/19/20  [facility] Long Term Goal Met Date 07/04/20  [facility] CM Short Term Goal #1  Patient will be able to verbalize in the next 14 days  3 signs & symptoms to report to MD for increased urinary symptoms (UTI)  [facility] CM Short Term Goal #1 Start Date 03/19/20  [facility] CM Short Term Goal #1 Met Date 07/04/20     Joelene Millin L. Lavina Hamman, RN, BSN, [place] Coordinator Office number [phone] Main [facility] number [phone] Fax number [phone]

## 2020-07-09 ENCOUNTER — Telehealth: Payer: Self-pay | Admitting: Neurology

## 2020-07-09 MED ORDER — MODAFINIL 100 MG PO TABS
100.0000 mg | ORAL_TABLET | Freq: Every morning | ORAL | 3 refills | Status: DC
Start: 2020-07-09 — End: 2020-11-29

## 2020-07-09 NOTE — Telephone Encounter (Signed)
I called the patient after talking with Dr. Anne Hahn, [name] is really felt to be the best medication, he does continue to have a gradual decline, feels Ocreuvs isn't "pumping" him up anymore. We will try low dose Provigil in the morning to see if any benefit.

## 2020-07-11 ENCOUNTER — Telehealth: Payer: Self-pay | Admitting: Neurology

## 2020-07-11 NOTE — Telephone Encounter (Signed)
Finkler,[name]-(wife on DPR) is asking for a call on pt's modafinil (PROVIGIL) 100 MG tablet.  wife states pharmacy is telling her this is not covered by the insurance, please call pt or wife to discuss

## 2020-07-11 NOTE — Telephone Encounter (Signed)
  Approved today Request Reference Number: [id number]. MODAFINIL TAB 100MG  is approved through 01/08/2021. Patient may now fill this prescription and it will be covered.  Letter faxed to Pharmacy   Pt ins is Medicare D 01/10/2021 [id number]  ID- [id number]

## 2020-07-11 NOTE — Telephone Encounter (Signed)
A PA has  Been sent to Plan today Modafinil 100MG  tablets via CMM Key: [id number] - PA Case ID: ,

## 2020-07-29 ENCOUNTER — Telehealth: Payer: Self-pay | Admitting: Neurology

## 2020-07-29 ENCOUNTER — Other Ambulatory Visit (INDEPENDENT_AMBULATORY_CARE_PROVIDER_SITE_OTHER): Payer: Self-pay

## 2020-07-29 DIAGNOSIS — G35 Multiple sclerosis: Secondary | ICD-10-CM | POA: Diagnosis not present

## 2020-07-29 DIAGNOSIS — R338 Other retention of urine: Secondary | ICD-10-CM | POA: Diagnosis not present

## 2020-07-29 DIAGNOSIS — Z0289 Encounter for other administrative examinations: Secondary | ICD-10-CM

## 2020-07-29 DIAGNOSIS — G8250 Quadriplegia, unspecified: Secondary | ICD-10-CM | POA: Diagnosis not present

## 2020-07-29 NOTE — Telephone Encounter (Signed)
I called the patient, I talked to [name] and his wife [name].  For the last week, has felt numbness/weakness to both arms- at baseline, has no use of the right arm, but now having more difficulty with the left arm.  Feels the numbness from his thumbs to both shoulders.  His speech is slower, having a hard time talking.  He has had some confusion, headache, stiffness to the neck.  Has been going on for 1 week, feels getting worse.  No fevers or signs of infection.  Has Foley catheter, urine is dark, but his baseline, [name] doesn't think any change of urine.  Symptoms are different than previous exacerbations, the symptoms are stronger. Discussed with Dr. Danae Orleans, since different symptoms, recommend lab work/urine before treating with steroids. Will consider imaging depending on results. He will come by today for cbc, cmp, urine, orders are in.

## 2020-07-29 NOTE — Telephone Encounter (Signed)
I agree with assessment, if no evidence of infection, okay to treat with steroids.

## 2020-07-29 NOTE — Telephone Encounter (Signed)
I called pt, he is having he states for the last week, severe wekaness, numbness bilateral arms.  Cannot raise them, talking difficulty and confusion stiff neck he states.  Has foley catheter.  Different symptoms from when has had exacerbation from before.  ? MS exacerbation.  Did have some similar sx from last visit 07-02-20.

## 2020-07-29 NOTE — Telephone Encounter (Signed)
Pt called wanting to discuss some severe symptoms that he is having with his MS and he thinks he is needing a course of steroids. Please advise.

## 2020-07-30 ENCOUNTER — Telehealth: Payer: Self-pay | Admitting: Neurology

## 2020-07-30 MED ORDER — CIPROFLOXACIN HCL 500 MG PO TABS
500.0000 mg | ORAL_TABLET | Freq: Two times a day (BID) | ORAL | 0 refills | Status: DC
Start: 2020-07-30 — End: 2020-10-13

## 2020-07-30 NOTE — Telephone Encounter (Signed)
I tried to contact his urologist, and his PCP, I got no response.  I will go ahead and treat with Cipro 500 mg BID x 5 days.  Indicates has been on this before and did well.  We talked about side effects.  In the last several months, he has been on Bactrim, Macrobid, and doxycycline.  Hopefully the urologist will reach out tomorrow, culture is pending. I called and talked with Elva and his wife , [name].

## 2020-07-30 NOTE — Telephone Encounter (Signed)
I called [phone], or ext [phone] [facility] for [name], relating to pt and notes related to yesterday, guidance treating pt UTI. ( other ext [phone] n/a, or [phone] n/a) Please advise.

## 2020-07-30 NOTE — Telephone Encounter (Signed)
Pt's wife called back with the name of the Urologist. Dr. Florinda Marker, MD

## 2020-07-30 NOTE — Telephone Encounter (Signed)
I called [name] and LVM x2 re: pt.  I called wife and she relayed that Dr. Girard [name] ([facility] treated pt 06-11-20 with doxycycline, then saw Dr. Garald [name] 06/24/20 and was not treated ( as pt will have bacteria and not to be given ABX all the time due to resistance).  Was seeing [facility]. Jacquelyne [name], but stopped going to him (did not have nice things to say about him).

## 2020-07-30 NOTE — Telephone Encounter (Signed)
Spoke to [place].  I relayed that we have done UA and labs on MS pt who is having sx of possible MS Exacerbation.  He has foley, dark urine, shows UTI.  I did fax to her to have Dr. Garald Braver address tomorrow.  [phone] fax.  Cx pending.

## 2020-07-30 NOTE — Telephone Encounter (Signed)
Per colleen, she would have Dr. Marlis Edelson look at tomorrow as he is not there today.

## 2020-07-30 NOTE — Telephone Encounter (Signed)
Please try to get in touch with his urologist at the [facility] (not sure name, starts with M). Having MS exacerbation symptoms, UTI came up on urine, he has foley catheter, likely needs to be treated would be appreciate urology guidance to treat UTI. His wife reports, treated for UTI in mid September, with doxycycline, he could not tolerate, did not finish. CBC, CMP okay except glucose was 60. WBC 7.1.

## 2020-07-31 NOTE — Telephone Encounter (Addendum)
His PCP Dr. Willa Rough called back today, please send records to her office.  She was going to try to reach out to urology to request a sooner appointment. See if they could see Monnie sooner at PCP for follow-up.

## 2020-07-31 NOTE — Telephone Encounter (Signed)
I have faxed (busy) will try again.

## 2020-07-31 NOTE — Telephone Encounter (Signed)
[name] called back after speaking to Dr. Garald Braver. She stated that he would not treat pt if has positive cx with pt that as a indwelling foley catheter  And if no pain or a fever.  He has never seen UTI cause MS exacerbation. I relayed that we have.  Pt was started on CIPRO.  She will fax note tomorrow,  I gave her the fax # [phone].  I relayed that SS/NP had spoken to Dr. Willa Rough so she may be reaching out as well.

## 2020-08-01 ENCOUNTER — Telehealth: Payer: Self-pay | Admitting: Neurology

## 2020-08-01 NOTE — Telephone Encounter (Signed)
I called wife of pt.  She states that pt is doing much better relating to sx he presented with.   She had received call from Dr. Esperanza Richters nurse this am and was told to stop the cipro.  Wife stated they were going to buck the system and keep taking it since he is doing better. His arm movement is better, speech is better, no headache, neck pain is better.

## 2020-08-01 NOTE — Telephone Encounter (Signed)
Certainly, any urinalysis on this patient will show bacteria in the urine, the patient does have an indwelling catheter.  I suppose that if he is getting better with the Cipro, that is a suggestion that the patient may have some symptoms from this.

## 2020-08-01 NOTE — Telephone Encounter (Signed)
I called the patient, I talked with he and his wife.  He remains on Cipro, his headache is gone, the numbness has nearly completely subsided, his arms are moving better, he is overall feeling much better.  I received fax from Dr. Garald Braver urology at [facility], his recommendations are as follows -Positive urine cultures are not treated if no symptoms are being displayed by patient with fever, painful urination, burning, bloody urine or frequency -UTI would not cause MS exacerbations -Provider ordering urine culture needs to provide treatment  PCP, Dr. Willa Rough sent a note advising to proceed with MS treatments as indicated unless the patient is having symptoms of fever or symptoms of urosepsis such as altered mental status.  She recommends he stop taking Cipro.  I reviewed the recommendations with the patient, he feels much better with Cipro, wishes to continue.  He will call next week with an update.

## 2020-08-03 LAB — UA/M W/RFLX CULTURE, ROUTINE
Bilirubin, UA: NEGATIVE
Glucose, UA: NEGATIVE
Ketones, UA: NEGATIVE
Nitrite, UA: POSITIVE — AB
Protein,UA: NEGATIVE
Specific Gravity, UA: 1.006 (ref 1.005–1.030)
Urobilinogen, Ur: 0.2 mg/dL (ref 0.2–1.0)
pH, UA: 7 (ref 5.0–7.5)

## 2020-08-03 LAB — COMPREHENSIVE METABOLIC PANEL
ALT: 12 IU/L (ref 0–44)
AST: 13 IU/L (ref 0–40)
Albumin/Globulin Ratio: 2 (ref 1.2–2.2)
Albumin: 4.2 g/dL (ref 3.8–4.9)
Alkaline Phosphatase: 117 IU/L (ref 44–121)
BUN/Creatinine Ratio: 11 (ref 9–20)
BUN: 12 mg/dL (ref 6–24)
Bilirubin Total: 0.6 mg/dL (ref 0.0–1.2)
CO2: 26 mmol/L (ref 20–29)
Calcium: 9.2 mg/dL (ref 8.7–10.2)
Chloride: 101 mmol/L (ref 96–106)
Creatinine, Ser: 1.12 mg/dL (ref 0.76–1.27)
GFR calc Af Amer: 84 mL/min/{1.73_m2} (ref >59)
GFR calc non Af Amer: 73 mL/min/{1.73_m2} (ref >59)
Globulin, Total: 2.1 g/dL (ref 1.5–4.5)
Glucose: 60 mg/dL — ABNORMAL LOW (ref 65–99)
Potassium: 4.1 mmol/L (ref 3.5–5.2)
Sodium: 140 mmol/L (ref 134–144)
Total Protein: 6.3 g/dL (ref 6.0–8.5)

## 2020-08-03 LAB — MICROSCOPIC EXAMINATION
Casts: NONE SEEN /lpf
Epithelial Cells (non renal): NONE SEEN /hpf (ref 0–10)
RBC, Urine: NONE SEEN /hpf (ref 0–2)

## 2020-08-03 LAB — CBC WITH DIFFERENTIAL/PLATELET
Basophils Absolute: 0.1 10*3/uL (ref 0.0–0.2)
Basos: 1 %
EOS (ABSOLUTE): 0.2 10*3/uL (ref 0.0–0.4)
Eos: 3 %
Hematocrit: 46 % (ref 37.5–51.0)
Hemoglobin: 15.2 g/dL (ref 13.0–17.7)
Immature Grans (Abs): 0 10*3/uL (ref 0.0–0.1)
Immature Granulocytes: 0 %
Lymphocytes Absolute: 0.7 10*3/uL (ref 0.7–3.1)
Lymphs: 9 %
MCH: 29.2 pg (ref 26.6–33.0)
MCHC: 33 g/dL (ref 31.5–35.7)
MCV: 88 fL (ref 79–97)
Monocytes Absolute: 0.8 10*3/uL (ref 0.1–0.9)
Monocytes: 12 %
Neutrophils Absolute: 5.3 10*3/uL (ref 1.4–7.0)
Neutrophils: 75 %
Platelets: 323 10*3/uL (ref 150–450)
RBC: 5.21 x10E6/uL (ref 4.14–5.80)
RDW: 13.1 % (ref 11.6–15.4)
WBC: 7.1 10*3/uL (ref 3.4–10.8)

## 2020-08-03 LAB — URINE CULTURE, REFLEX

## 2020-08-05 ENCOUNTER — Telehealth: Payer: Self-pay | Admitting: *Deleted

## 2020-08-05 NOTE — Telephone Encounter (Signed)
-----   Message from Glean Salvo, NP sent at 08/05/2020  7:45 AM EDT ----- Urine culture came back positive for Pseudomonas.  It was sensitive to Cipro.  When I last talked him, he had greatly improved. I think it would be a good idea to follow up with PCP or urology following this event, his UTI presentation may be different, hence him reporting such drastic improvement with antibiotic.

## 2020-08-05 NOTE — Telephone Encounter (Signed)
I called and LMVM for [name], wife of pt and relayed the results of his labs (culture results- pseudomonas sensitive to cipro).  Will fax to pcp and Dr. Garald Braver office as well.

## 2020-08-07 NOTE — Telephone Encounter (Signed)
Fax confirmations received.

## 2020-08-21 ENCOUNTER — Telehealth: Payer: Self-pay | Admitting: *Deleted

## 2020-08-21 NOTE — Telephone Encounter (Signed)
Received fax about anticipated MCR denial due to med necessity for 07-29-20 CX.  G35 MS on ocrevus, R82.50 quadriparesis, R33.8 urinary retention w/ foley catheter. Fax confirmation received [phone], of [phone].

## 2020-08-29 DIAGNOSIS — N390 Urinary tract infection, site not specified: Secondary | ICD-10-CM | POA: Diagnosis not present

## 2020-08-29 DIAGNOSIS — R319 Hematuria, unspecified: Secondary | ICD-10-CM | POA: Diagnosis not present

## 2020-10-11 DIAGNOSIS — N3000 Acute cystitis without hematuria: Secondary | ICD-10-CM | POA: Diagnosis not present

## 2020-10-11 DIAGNOSIS — R300 Dysuria: Secondary | ICD-10-CM | POA: Diagnosis not present

## 2020-10-13 ENCOUNTER — Encounter (HOSPITAL_COMMUNITY): Payer: Self-pay | Admitting: Emergency Medicine

## 2020-10-13 ENCOUNTER — Observation Stay (HOSPITAL_COMMUNITY)
Admission: EM | Admit: 2020-10-13 | Discharge: 2020-10-14 | Disposition: A | Discharge status: Home / Self Care | Payer: No Typology Code available for payment source | Attending: Family Medicine | Admitting: Family Medicine

## 2020-10-13 ENCOUNTER — Other Ambulatory Visit: Payer: Self-pay

## 2020-10-13 DIAGNOSIS — Z20822 Contact with and (suspected) exposure to covid-19: Secondary | ICD-10-CM | POA: Insufficient documentation

## 2020-10-13 DIAGNOSIS — T83511A Infection and inflammatory reaction due to indwelling urethral catheter, initial encounter: Principal | ICD-10-CM | POA: Insufficient documentation

## 2020-10-13 DIAGNOSIS — Z993 Dependence on wheelchair: Secondary | ICD-10-CM | POA: Insufficient documentation

## 2020-10-13 DIAGNOSIS — N390 Urinary tract infection, site not specified: Secondary | ICD-10-CM | POA: Diagnosis present

## 2020-10-13 DIAGNOSIS — B965 Pseudomonas (aeruginosa) (mallei) (pseudomallei) as the cause of diseases classified elsewhere: Secondary | ICD-10-CM | POA: Diagnosis not present

## 2020-10-13 DIAGNOSIS — G35C1 Active secondary progressive multiple sclerosis: Secondary | ICD-10-CM | POA: Diagnosis present

## 2020-10-13 DIAGNOSIS — G8250 Quadriplegia, unspecified: Secondary | ICD-10-CM | POA: Diagnosis not present

## 2020-10-13 DIAGNOSIS — Z820 Family history of epilepsy and other diseases of the nervous system: Secondary | ICD-10-CM | POA: Insufficient documentation

## 2020-10-13 DIAGNOSIS — G35 Multiple sclerosis: Secondary | ICD-10-CM | POA: Diagnosis present

## 2020-10-13 DIAGNOSIS — Z79899 Other long term (current) drug therapy: Secondary | ICD-10-CM | POA: Insufficient documentation

## 2020-10-13 DIAGNOSIS — Z87891 Personal history of nicotine dependence: Secondary | ICD-10-CM | POA: Insufficient documentation

## 2020-10-13 DIAGNOSIS — R110 Nausea: Secondary | ICD-10-CM | POA: Diagnosis not present

## 2020-10-13 DIAGNOSIS — N3000 Acute cystitis without hematuria: Secondary | ICD-10-CM

## 2020-10-13 DIAGNOSIS — M542 Cervicalgia: Secondary | ICD-10-CM | POA: Diagnosis not present

## 2020-10-13 DIAGNOSIS — F32A Depression, unspecified: Secondary | ICD-10-CM | POA: Diagnosis not present

## 2020-10-13 DIAGNOSIS — Z66 Do not resuscitate: Secondary | ICD-10-CM | POA: Insufficient documentation

## 2020-10-13 DIAGNOSIS — R519 Headache, unspecified: Secondary | ICD-10-CM | POA: Diagnosis not present

## 2020-10-13 DIAGNOSIS — N319 Neuromuscular dysfunction of bladder, unspecified: Secondary | ICD-10-CM | POA: Diagnosis present

## 2020-10-13 LAB — CBC WITH DIFFERENTIAL/PLATELET
Abs Immature Granulocytes: 0.04 10*3/uL (ref 0.00–0.07)
Basophils Absolute: 0.1 10*3/uL (ref 0.0–0.1)
Basophils Relative: 1 %
Eosinophils Absolute: 0.2 10*3/uL (ref 0.0–0.5)
Eosinophils Relative: 3 %
HCT: 45.4 % (ref 39.0–52.0)
Hemoglobin: 15.3 g/dL (ref 13.0–17.0)
Immature Granulocytes: 0 %
Lymphocytes Relative: 8 %
Lymphs Abs: 0.7 10*3/uL (ref 0.7–4.0)
MCH: 30.7 pg (ref 26.0–34.0)
MCHC: 33.7 g/dL (ref 30.0–36.0)
MCV: 91.2 fL (ref 80.0–100.0)
Monocytes Absolute: 0.6 10*3/uL (ref 0.1–1.0)
Monocytes Relative: 7 %
Neutro Abs: 7.4 10*3/uL (ref 1.7–7.7)
Neutrophils Relative %: 81 %
Platelets: 283 10*3/uL (ref 150–400)
RBC: 4.98 MIL/uL (ref 4.22–5.81)
RDW: 13.7 % (ref 11.5–15.5)
WBC: 9.1 10*3/uL (ref 4.0–10.5)
nRBC: 0 % (ref 0.0–0.2)

## 2020-10-13 LAB — LACTIC ACID, PLASMA: Lactic Acid, Venous: 1 mmol/L (ref 0.5–1.9)

## 2020-10-13 LAB — URINALYSIS, ROUTINE W REFLEX MICROSCOPIC
Bilirubin Urine: NEGATIVE
Glucose, UA: NEGATIVE mg/dL
Ketones, ur: NEGATIVE mg/dL
Nitrite: POSITIVE — AB
Protein, ur: NEGATIVE mg/dL
Specific Gravity, Urine: 1.015 (ref 1.005–1.030)
pH: 6.5 (ref 5.0–8.0)

## 2020-10-13 LAB — COMPREHENSIVE METABOLIC PANEL
ALT: 16 U/L (ref 0–44)
AST: 16 U/L (ref 15–41)
Albumin: 3.9 g/dL (ref 3.5–5.0)
Alkaline Phosphatase: 117 U/L (ref 38–126)
Anion gap: 9 (ref 5–15)
BUN: 13 mg/dL (ref 6–20)
CO2: 26 mmol/L (ref 22–32)
Calcium: 9 mg/dL (ref 8.9–10.3)
Chloride: 103 mmol/L (ref 98–111)
Creatinine, Ser: 1.05 mg/dL (ref 0.61–1.24)
GFR, Estimated: >60 mL/min (ref >60)
Glucose, Bld: 90 mg/dL (ref 70–99)
Potassium: 3.9 mmol/L (ref 3.5–5.1)
Sodium: 138 mmol/L (ref 135–145)
Total Bilirubin: 0.8 mg/dL (ref 0.3–1.2)
Total Protein: 6.4 g/dL — ABNORMAL LOW (ref 6.5–8.1)

## 2020-10-13 LAB — URINALYSIS, MICROSCOPIC (REFLEX)

## 2020-10-13 MED ORDER — ENOXAPARIN SODIUM 40 MG/0.4ML ~~LOC~~ SOLN
40.0000 mg | SUBCUTANEOUS | Status: DC
  Administered 2020-10-14: 40 mg via SUBCUTANEOUS
  Filled 2020-10-13: qty 0.4

## 2020-10-13 MED ORDER — BUPROPION HCL ER (XL) 150 MG PO TB24
300.0000 mg | ORAL_TABLET | Freq: Every day | ORAL | Status: DC
  Administered 2020-10-14: 300 mg via ORAL
  Filled 2020-10-13: qty 2

## 2020-10-13 MED ORDER — PIPERACILLIN-TAZOBACTAM 3.375 G IVPB
3.3750 g | Freq: Three times a day (TID) | INTRAVENOUS | Status: DC
  Administered 2020-10-14: 3.375 g via INTRAVENOUS
  Filled 2020-10-13: qty 50

## 2020-10-13 MED ORDER — MODAFINIL 100 MG PO TABS
100.0000 mg | ORAL_TABLET | Freq: Every morning | ORAL | Status: DC
  Administered 2020-10-14: 100 mg via ORAL
  Filled 2020-10-13: qty 1

## 2020-10-13 MED ORDER — ACETAMINOPHEN 325 MG PO TABS: 650.0000 mg | ORAL_TABLET | Freq: Four times a day (QID) | ORAL | Status: DC | PRN

## 2020-10-13 MED ORDER — PIPERACILLIN-TAZOBACTAM 3.375 G IVPB 30 MIN
3.3750 g | Freq: Once | INTRAVENOUS | Status: AC
  Administered 2020-10-13: 3.375 g via INTRAVENOUS
  Filled 2020-10-13: qty 50

## 2020-10-13 MED ORDER — ALBUTEROL SULFATE HFA 108 (90 BASE) MCG/ACT IN AERS: 2.0000 | INHALATION_SPRAY | Freq: Four times a day (QID) | RESPIRATORY_TRACT | Status: DC | PRN

## 2020-10-13 MED ORDER — ACETAMINOPHEN 650 MG RE SUPP: 650.0000 mg | Freq: Four times a day (QID) | RECTAL | Status: DC | PRN

## 2020-10-13 MED ORDER — BACLOFEN 5 MG HALF TABLET: 5.0000 mg | ORAL_TABLET | ORAL | Status: DC

## 2020-10-13 MED ORDER — DEXTROMETHORPHAN-QUINIDINE 20-10 MG PO CAPS
1.0000 | ORAL_CAPSULE | Freq: Two times a day (BID) | ORAL | Status: DC
  Administered 2020-10-14 (×2): 1 via ORAL
  Filled 2020-10-13 (×4): qty 1

## 2020-10-13 NOTE — ED Provider Notes (Signed)
[facility] EMERGENCY DEPARTMENT Provider Note   CSN: [id number] Arrival date & time: 10/13/20  1328     History Chief Complaint  Patient presents with  . Urinary Tract Infection    [name] is a 60 y.o. male.  HPI   Patient is a 60 year old male with a history of multiple sclerosis within indwelling Foley catheter.  He presents the emergency department with his wife states that he has been experiencing foul-smelling urine for the past week.  They went to urgent care 2 days ago.  He was started on p.o. ciprofloxacin.  He has taken 3 doses of this medication.  They were called today stating that his urine culture resulted and was showing 50,000-100,000 CFU's of Pseudomonas.  They were instructed that he needed to be started on IV cefepime.  They came to the emergency department for antibiotics.  Patient notes some increased malaise and decreased oral intake.  Otherwise has no complaints at this time.     Past Medical History:  Diagnosis Date  . Abnormality of gait 11/21/2015  . Asthma    childhood asthma  . Classic migraine   . Depression   . Dysphagia   . Hay fever   . Headache syndrome 12/22/2018  . MS (multiple sclerosis) (HCC)   . 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 Lactic acidosis 02/23/2020  . Sepsis due to Enterococcus (HCC) 02/23/2020  . Enterococcus UTI 02/23/2020  . Hyperkalemia 02/23/2020  . Penile bleeding 02/23/2020  . Acute respiratory failure with hypoxemia (HCC) 02/23/2020  . 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 Restrictive lung disease 09/06/2017  . Depression 09/06/2017  . MS (multiple sclerosis) (HCC) 08/13/2017  . Dyspnea 08/13/2017  . 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 Abnormality of gait 11/21/2015  . Transient alteration of awareness 11/21/2015  . Neck pain 05/10/2015  . Foraminal stenosis of cervical region 05/10/2015  . Meralgia paresthetica, tx by Dr. Sandria Manly  09/21/2012  . Neurogenic bladder 09/21/2012  . 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 eye surgeries     x 2; bilateral 74 and 4       Family History  Problem Relation Age of Onset  . Cancer Father   . Multiple sclerosis Sister   . Seizures Maternal Uncle   . Parkinsonism Maternal Uncle   . Multiple sclerosis Sister   . Multiple sclerosis Paternal Uncle   . Multiple sclerosis Other   . Lung cancer Other        parent  . Uterine cancer Other        other    Social History   Tobacco Use  . Smoking status: Former Games developer  . Smokeless tobacco: Never Used  Substance Use Topics  . Alcohol use: No    Comment: rare   . Drug use: No    Home Medications Prior to Admission medications   Medication Sig Start Date End Date Taking? Authorizing Provider  albuterol (PROVENTIL HFA;VENTOLIN HFA) 108 (90 Base) MCG/ACT inhaler Inhale 2 puffs into the lungs every 6 (six) hours as needed. Patient taking differently: Inhale 2 puffs into the lungs every 6 (six) hours as needed for wheezing or shortness of breath.  07/19/18   Terressa Koyanagi, DO  baclofen (LIORESAL) 10 MG tablet 1/2 tablet in the morning and at noon, and 2 tablets at night Patient taking differently: Take 2.5-10 mg by  mouth See admin instructions. Take 1/2 tablet in the morning and at noon, and take 2 tablets at night 05/20/16   Kathrynn Ducking, MD  buPROPion (WELLBUTRIN XL) 300 MG 24 hr tablet Take 300 mg by mouth daily.    [provider]  ciprofloxacin (CIPRO) 500 MG tablet Take 1 tablet (500 mg total) by mouth 2 (two) times daily. 07/30/20   Suzzanne Cloud, NP  Dextromethorphan-Quinidine 20-10 MG CAPS Take 1 tablet by mouth 2 (two) times daily. 05/27/17   Kathrynn Ducking, MD  GABAPENTIN PO Take by mouth.    [provider]  ibuprofen (ADVIL) 200 MG tablet Take 200 mg by mouth every 6 (six) hours as needed.    [provider]   modafinil (PROVIGIL) 100 MG tablet Take 1 tablet (100 mg total) by mouth in the morning. 07/09/20   Suzzanne Cloud, NP  ocrelizumab 600 mg in sodium chloride 0.9 % 500 mL Inject 600 mg into the vein every 6 (six) months.     [provider]    Allergies    Patient has no known allergies.  Review of Systems   Review of Systems  All other systems reviewed and are negative. Ten systems reviewed and are negative for acute change, except as noted in the HPI.    Physical Exam Updated Vital Signs BP 120/78   Pulse 73   Temp 98.6 F (37 C) (Oral)   Resp 20   SpO2 97%   Physical Exam Vitals and nursing note reviewed.  Constitutional:      General: He is not in acute distress.    Appearance: Normal appearance. He is not ill-appearing, toxic-appearing or diaphoretic.  HENT:     Head: Normocephalic and atraumatic.     Right Ear: External ear normal.     Left Ear: External ear normal.     Nose: Nose normal.     Mouth/Throat:     Mouth: Mucous membranes are moist.     Pharynx: Oropharynx is clear. No oropharyngeal exudate or posterior oropharyngeal erythema.  Eyes:     Extraocular Movements: Extraocular movements intact.  Cardiovascular:     Rate and Rhythm: Normal rate and regular rhythm.     Pulses: Normal pulses.     Heart sounds: Normal heart sounds. No murmur heard. No friction rub. No gallop.   Pulmonary:     Effort: Pulmonary effort is normal. No respiratory distress.     Breath sounds: Normal breath sounds. No stridor. No wheezing, rhonchi or rales.  Abdominal:     General: Abdomen is flat.     Tenderness: There is no abdominal tenderness.  Genitourinary:    Comments: Indwelling Foley catheter.  200 cc of yellow urine noted in the Foley catheter bag.  No signs of erythema.  No swelling. Musculoskeletal:        General: Normal range of motion.     Cervical back: Normal range of motion and neck supple. No tenderness.  Skin:    General: Skin is warm and dry.   Neurological:     Mental Status: He is alert and oriented to person, place, and time.     Comments: History of MS.  Paraplegic.  Psychiatric:        Mood and Affect: Mood normal.        Behavior: Behavior normal.    ED Results / Procedures / Treatments   Labs (all labs ordered are listed, but only abnormal results are displayed) Labs  Reviewed  COMPREHENSIVE METABOLIC PANEL - Abnormal; Notable for the following components:      Result Value   Total Protein 6.4 (*)    All other components within normal limits  URINALYSIS, ROUTINE W REFLEX MICROSCOPIC - Abnormal; Notable for the following components:   APPearance CLOUDY (*)    Hgb urine dipstick LARGE (*)    Nitrite POSITIVE (*)    Leukocytes,Ua MODERATE (*)    All other components within normal limits  URINALYSIS, MICROSCOPIC (REFLEX) - Abnormal; Notable for the following components:   Bacteria, UA FEW (*)    All other components within normal limits  URINE CULTURE  RESP PANEL BY RT-PCR (FLU A&B, COVID) ARPGX2  LACTIC ACID, PLASMA  CBC WITH DIFFERENTIAL/PLATELET  LACTIC ACID, PLASMA   EKG None  Radiology No results found.  Procedures Procedures (including critical care time)  Medications Ordered in ED Medications  piperacillin-tazobactam (ZOSYN) IVPB 3.375 g (3.375 g Intravenous New Bag/Given 10/13/20 2241)    Followed by  piperacillin-tazobactam (ZOSYN) IVPB 3.375 g (has no administration in time range)  buPROPion (WELLBUTRIN XL) 24 hr tablet 300 mg (has no administration in time range)  Dextromethorphan-quiNIDine (NUEDEXTA) 20-10 MG per capsule 1 capsule (has no administration in time range)  modafinil (PROVIGIL) tablet 100 mg (has no administration in time range)  baclofen (LIORESAL) tablet 5-10 mg (has no administration in time range)  albuterol (VENTOLIN HFA) 108 (90 Base) MCG/ACT inhaler 2 puff (has no administration in time range)   ED Course  I have reviewed the triage vital signs and the nursing  notes.  Pertinent labs & imaging results that were available during my care of the patient were reviewed by me and considered in my medical decision making (see chart for details).    MDM Rules/Calculators/A&P                          Patient is a 60 year old male who presents to the emergency department due to UTI.  Patient has been experiencing and increase in malodorous urine as well as cloudy urine.  He has a history of MS and is paraplegic and wheelchair-bound with an indwelling Foley catheter.  He was evaluated in urgent care 2 days ago and started on ciprofloxacin.  He took 3 doses.  They were contacted today and notified that his urine culture resulted and he needed to come to the emergency department for IV antibiotics.  Culture obtained 2 days ago shows 50,000-100,000 CFU's of Pseudomonas.  Patient was discussed with pharmacy who also reviewed the urine culture.  They recommended Zosyn 3 times daily.  Pharmacy consult placed.  This was discussed with the patient as well as his wife.  Recommended admission for IV antibiotics and they are amenable.  Respiratory panel has been ordered.  Final Clinical Impression(s) / ED Diagnoses Final diagnoses:  Urinary tract infection associated with indwelling urethral catheter, initial encounter [facility])   Rx / DC Orders ED Discharge Orders    None       Placido Sou, PA-C 10/13/20 2258    Milagros Loll, MD 10/14/20 2344

## 2020-10-13 NOTE — H&P (Signed)
History and Physical   [name] XIP:[id number] DOB: 08-04-1961 DOA: 10/13/2020  PCP: Reeves Dam, MD   Patient coming from: Home  Chief Complaint: UTI  HPI: [name] is a 60 y.o. male with medical history significant of depression, cervical foraminal stenosis, secondary progressive MS with neurogenic bladder and blastic quadriplegia, asthma presents from urgent care due to need for IV antibiotics.  Patient had several days of dark foul-smelling urine prompting him to visit urgent care 2 days ago he was started on p.o. Cipro which she had taken 3 doses of until he was called by the urgent care notifying him that his culture had grown out Pseudomonas and that he need to present to the ED for IV antibiotics.  He denies any fevers.  He did have some loss of appetite which his significant other states is typical for his symptoms.  He denies shortness of breath, chest pain, abdominal pain.  ED Course: Vital signs stable in the ED.  Lab work-up showed normal BMP.  LFTs with protein 6.4.  CBC within normal notes.  Lactate acid normal.  Respiratory panel for flu and Covid pending.  Urinalysis and urine culture is pending.  Review of Systems: As per HPI otherwise all other systems reviewed and are negative.  Past Medical History:  Diagnosis Date  . Abnormality of gait 11/21/2015  . Asthma    childhood asthma  . Classic migraine   . Depression   . Dysphagia   . Hay fever   . Headache syndrome 12/22/2018  . MS (multiple sclerosis) ([place])   . 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 eye surgeries     x 2; bilateral 74 and 68    Social History  reports that he has quit smoking. He has never used smokeless tobacco. He reports that he does not drink alcohol and does not use drugs.  No Known Allergies  Family History  Problem Relation Age of Onset  . Cancer Father   . Multiple sclerosis Sister   . Seizures Maternal Uncle   .  Parkinsonism Maternal Uncle   . Multiple sclerosis Sister   . Multiple sclerosis Paternal Uncle   . Multiple sclerosis Other   . Lung cancer Other        parent  . Uterine cancer Other        other  Reviewed on admission  Prior to Admission medications   Medication Sig Start Date End Date Taking? Authorizing Provider  albuterol (PROVENTIL HFA;VENTOLIN HFA) 108 (90 Base) MCG/ACT inhaler Inhale 2 puffs into the lungs every 6 (six) hours as needed. Patient taking differently: Inhale 2 puffs into the lungs every 6 (six) hours as needed for wheezing or shortness of breath.  07/19/18   Lucretia Kern, DO  baclofen (LIORESAL) 10 MG tablet 1/2 tablet in the morning and at noon, and 2 tablets at night Patient taking differently: Take 2.5-10 mg by mouth See admin instructions. Take 1/2 tablet in the morning and at noon, and take 2 tablets at night 05/20/16   Kathrynn Ducking, MD  buPROPion (WELLBUTRIN XL) 300 MG 24 hr tablet Take 300 mg by mouth daily.    [provider]  Dextromethorphan-Quinidine 20-10 MG CAPS Take 1 tablet by mouth 2 (two) times daily. 05/27/17   Kathrynn Ducking, MD  GABAPENTIN PO Take by mouth.    [provider]  ibuprofen (ADVIL) 200 MG tablet Take 200 mg by  mouth every 6 (six) hours as needed.    [provider]  modafinil (PROVIGIL) 100 MG tablet Take 1 tablet (100 mg total) by mouth in the morning. 07/09/20   Glean Salvo, NP  ocrelizumab 600 mg in sodium chloride 0.9 % 500 mL Inject 600 mg into the vein every 6 (six) months.     [provider]    Physical Exam: Vitals:   10/13/20 2215 10/13/20 2245 10/13/20 2300 10/13/20 2315  BP: 123/76 120/82 115/77 112/79  Pulse: 73 74 70 72  Resp: (!) 21 (!) 22 17 20   Temp:      TempSrc:      SpO2: 97% 95% 95% 94%  Weight:      Height:       Physical Exam Constitutional:      General: He is not in acute distress.    Comments: Pleasant wheelchair bound middle-age male  HENT:     Head:  Normocephalic and atraumatic.     Mouth/Throat:     Mouth: Mucous membranes are moist.     Pharynx: Oropharynx is clear.  Eyes:     Extraocular Movements: Extraocular movements intact.     Pupils: Pupils are equal, round, and reactive to light.  Cardiovascular:     Rate and Rhythm: Normal rate and regular rhythm.     Pulses: Normal pulses.     Heart sounds: Normal heart sounds.  Pulmonary:     Effort: Pulmonary effort is normal. No respiratory distress.     Breath sounds: Normal breath sounds.  Abdominal:     General: Bowel sounds are normal. There is no distension.     Palpations: Abdomen is soft.     Tenderness: There is no abdominal tenderness.  Musculoskeletal:        General: No swelling or deformity.     Right lower leg: Edema present.     Left lower leg: Edema present.  Skin:    General: Skin is warm and dry.  Neurological:     General: No focal deficit present.     Mental Status: Mental status is at baseline.    Labs on Admission: I have personally reviewed following labs and imaging studies  CBC: Recent Labs  Lab 10/13/20 1520  WBC 9.1  NEUTROABS 7.4  HGB 15.3  HCT 45.4  MCV 91.2  PLT 283    Basic Metabolic Panel: Recent Labs  Lab 10/13/20 1520  NA 138  K 3.9  CL 103  CO2 26  GLUCOSE 90  BUN 13  CREATININE 1.05  CALCIUM 9.0    GFR: Estimated Creatinine Clearance: 87.9 mL/min (by C-G formula based on SCr of 1.05 mg/dL).  Liver Function Tests: Recent Labs  Lab 10/13/20 1520  AST 16  ALT 16  ALKPHOS 117  BILITOT 0.8  PROT 6.4*  ALBUMIN 3.9    Urine analysis:    Component Value Date/Time   COLORURINE YELLOW 10/13/2020 2228   APPEARANCEUR CLOUDY (A) 10/13/2020 2228   APPEARANCEUR Clear 07/29/2020 1429   LABSPEC 1.015 10/13/2020 2228   PHURINE 6.5 10/13/2020 2228   GLUCOSEU NEGATIVE 10/13/2020 2228   HGBUR LARGE (A) 10/13/2020 2228   BILIRUBINUR NEGATIVE 10/13/2020 2228   BILIRUBINUR Negative 07/29/2020 1429   KETONESUR NEGATIVE  10/13/2020 2228   PROTEINUR NEGATIVE 10/13/2020 2228   UROBILINOGEN 0.2 09/12/2018 1534   NITRITE POSITIVE (A) 10/13/2020 2228   LEUKOCYTESUR MODERATE (A) 10/13/2020 2228    Radiological Exams on Admission: No results found.  EKG:  Not yet performed.  Assessment/Plan Principal Problem:   UTI (urinary tract infection) Active Problems:   Multiple sclerosis, secondary progressive (HCC)   Neurogenic bladder   Spastic quadriparesis (HCC)  UTI > History of neurogenic bladder secondary to progressive MS. > Patient sent from urgent care due to urine cultures growing Pseudomonas > Has had several days of dark foul-smelling urine went to urgent care 2 days ago started on p.o. Cipro > Started on Zosyn in ED - May benefit from consult to pharmacy for assistance with outpatient parenteral antibiotics if patient needs to remain on IV antibiotics for the treatment duration.  No other reason to keep him admitted. - Continue Zosyn - Follow-up urinalysis and urine culture - Trend fever curve and white count  Multiple sclerosis > Secondary progressive with neurogenic bladder and spastic quadriplegia - Continue home baclofen, Nuedexta, modafinil Asthma - Continue as needed albuterol  Depression - Continue home bupropion  DVT prophylaxis: Lovenox  Code Status:   DNR  Family Communication:  Significant other updated at bedside Disposition Plan:   Patient is from:  Home  Anticipated DC to:  Home  Anticipated DC date:  1 to 2 days  Anticipated DC barriers: None  Consults called:  None  Admission status:  Observation, MedSurg   Severity of Illness: The appropriate patient status for this patient is OBSERVATION. Observation status is judged to be reasonable and necessary in order to provide the required intensity of service to ensure the patient's safety. The patient's presenting symptoms, physical exam findings, and initial radiographic and laboratory data in the context of their medical  condition is felt to place them at decreased risk for further clinical deterioration. Furthermore, it is anticipated that the patient will be medically stable for discharge from the hospital within 2 midnights of admission. The following factors support the patient status of observation.   " The patient's presenting symptoms include dark and foul-smelling urine. " The physical exam findings include stable findings and patient with progressive MS. " The initial radiographic and laboratory data are stable at this time with urine studies pending.   Synetta Fail MD [facility]  How to contact the [facility] Attending or Consulting provider 7A - 7P or covering provider during after hours 7P -7A, for this patient?   1. Check the care team in [facility] and look for a) attending/consulting [facility] provider listed and b) the [facility] team listed 2. Log into [email or web address] and use [facility]'s universal password to access. If you do not have the password, please contact the hospital operator. 3. Locate the [facility] provider you are looking for under [facility] and page to a number that you can be directly reached. 4. If you still have difficulty reaching the provider, please page the [facility] (Director on Call) for the Hospitalists listed on amion for assistance.  10/13/2020, 11:24 PM

## 2020-10-13 NOTE — ED Triage Notes (Signed)
Pt sent by UC for IV antibiotics for UTI. Pt with indwelling catheter.

## 2020-10-14 ENCOUNTER — Observation Stay: Payer: Self-pay

## 2020-10-14 DIAGNOSIS — G35 Multiple sclerosis: Secondary | ICD-10-CM | POA: Diagnosis not present

## 2020-10-14 DIAGNOSIS — R519 Headache, unspecified: Secondary | ICD-10-CM

## 2020-10-14 DIAGNOSIS — N390 Urinary tract infection, site not specified: Secondary | ICD-10-CM | POA: Diagnosis not present

## 2020-10-14 DIAGNOSIS — R110 Nausea: Secondary | ICD-10-CM | POA: Diagnosis not present

## 2020-10-14 DIAGNOSIS — T83511A Infection and inflammatory reaction due to indwelling urethral catheter, initial encounter: Secondary | ICD-10-CM | POA: Diagnosis not present

## 2020-10-14 DIAGNOSIS — M542 Cervicalgia: Secondary | ICD-10-CM | POA: Diagnosis not present

## 2020-10-14 DIAGNOSIS — N319 Neuromuscular dysfunction of bladder, unspecified: Secondary | ICD-10-CM

## 2020-10-14 DIAGNOSIS — N3000 Acute cystitis without hematuria: Secondary | ICD-10-CM | POA: Diagnosis not present

## 2020-10-14 DIAGNOSIS — B965 Pseudomonas (aeruginosa) (mallei) (pseudomallei) as the cause of diseases classified elsewhere: Secondary | ICD-10-CM | POA: Diagnosis not present

## 2020-10-14 LAB — COMPREHENSIVE METABOLIC PANEL
ALT: 16 U/L (ref 0–44)
AST: 16 U/L (ref 15–41)
Albumin: 3.7 g/dL (ref 3.5–5.0)
Alkaline Phosphatase: 114 U/L (ref 38–126)
Anion gap: 9 (ref 5–15)
BUN: 12 mg/dL (ref 6–20)
CO2: 22 mmol/L (ref 22–32)
Calcium: 9 mg/dL (ref 8.9–10.3)
Chloride: 102 mmol/L (ref 98–111)
Creatinine, Ser: 1.06 mg/dL (ref 0.61–1.24)
GFR, Estimated: >60 mL/min (ref >60)
Glucose, Bld: 80 mg/dL (ref 70–99)
Potassium: 4.1 mmol/L (ref 3.5–5.1)
Sodium: 133 mmol/L — ABNORMAL LOW (ref 135–145)
Total Bilirubin: 1.4 mg/dL — ABNORMAL HIGH (ref 0.3–1.2)
Total Protein: 6.2 g/dL — ABNORMAL LOW (ref 6.5–8.1)

## 2020-10-14 LAB — RESP PANEL BY RT-PCR (FLU A&B, COVID) ARPGX2
Influenza A by PCR: NEGATIVE
Influenza B by PCR: NEGATIVE
SARS Coronavirus 2 by RT PCR: NEGATIVE

## 2020-10-14 LAB — CBC
HCT: 44 % (ref 39.0–52.0)
Hemoglobin: 15.1 g/dL (ref 13.0–17.0)
MCH: 30.9 pg (ref 26.0–34.0)
MCHC: 34.3 g/dL (ref 30.0–36.0)
MCV: 90 fL (ref 80.0–100.0)
Platelets: 257 10*3/uL (ref 150–400)
RBC: 4.89 MIL/uL (ref 4.22–5.81)
RDW: 13.6 % (ref 11.5–15.5)
WBC: 6.6 10*3/uL (ref 4.0–10.5)
nRBC: 0 % (ref 0.0–0.2)

## 2020-10-14 LAB — LACTIC ACID, PLASMA: Lactic Acid, Venous: 1.2 mmol/L (ref 0.5–1.9)

## 2020-10-14 MED ORDER — MEROPENEM IV (FOR PTA / DISCHARGE USE ONLY): 1.0000 g | Freq: Three times a day (TID) | INTRAVENOUS | 0 refills | Status: AC

## 2020-10-14 MED ORDER — CHLORHEXIDINE GLUCONATE CLOTH 2 % EX PADS: 6.0000 | MEDICATED_PAD | Freq: Every day | CUTANEOUS | Status: DC

## 2020-10-14 MED ORDER — HEPARIN SOD (PORK) LOCK FLUSH 100 UNIT/ML IV SOLN
250.0000 [IU] | INTRAVENOUS | Status: AC | PRN
  Administered 2020-10-14: 250 [IU]
  Filled 2020-10-14: qty 2.5

## 2020-10-14 MED ORDER — BACLOFEN 5 MG HALF TABLET
5.0000 mg | ORAL_TABLET | ORAL | Status: DC
  Administered 2020-10-14 (×2): 5 mg via ORAL
  Filled 2020-10-14 (×3): qty 1

## 2020-10-14 MED ORDER — BACLOFEN 10 MG PO TABS
20.0000 mg | ORAL_TABLET | Freq: Every day | ORAL | Status: DC
  Administered 2020-10-14: 20 mg via ORAL
  Filled 2020-10-14: qty 1
  Filled 2020-10-14: qty 2

## 2020-10-14 MED ORDER — SODIUM CHLORIDE 0.9 % IV SOLN
1.0000 g | Freq: Three times a day (TID) | INTRAVENOUS | Status: DC
  Administered 2020-10-14 (×2): 1 g via INTRAVENOUS
  Filled 2020-10-14 (×4): qty 1

## 2020-10-14 MED ORDER — SODIUM CHLORIDE 0.9% FLUSH
10.0000 mL | Freq: Two times a day (BID) | INTRAVENOUS | Status: DC
  Administered 2020-10-14: 10 mL

## 2020-10-14 MED ORDER — SODIUM CHLORIDE 0.9% FLUSH: 10.0000 mL | INTRAVENOUS | Status: DC | PRN

## 2020-10-14 NOTE — Discharge Summary (Signed)
Physician Discharge Summary  BACH ROCCHI ZOX:[id number] DOB: January 20, 1961 DOA: 10/13/2020  PCP: Reeves Dam, MD  Admit date: 10/13/2020 Discharge date: 10/14/2020  Admitted From: Home  Disposition:  Home with [facility]   Recommendations for Outpatient Follow-up:  1. Follow up with PCP in 1-2 weeks 2. Dr. Ishmael Holter: Please obtain BMP/CBC in one week     Home Health: RN for home infusion therapy  Equipment/Devices: PICC  Discharge Condition: Good  CODE STATUS: FULL Diet recommendation: Regular  Brief/Interim Summary: Mr. Mimbs is a 60 y.o. M with secondary progressive MS with neurogenic bladder, spastic quadriplegia who presented with abnormal urinalysis.  Patient started to have foul-smelling urine, nausea, increased stools, severe fatigue, and back discomfort over the last few days.  Wife recognizes these as typical of his Foley catheter infections, and so brought him to urgent care where urine culture grew ciprofloxacin distant Pseudomonas.  In the ER, patient was afebrile, WBC normal, heart rate and blood pressure normal.  Lactate normal.     PRINCIPAL HOSPITAL DIAGNOSIS: UTI    Discharge Diagnoses:   UTI, complicated due to Foley catheter indwelling ID was consulted, recommended 7 days IV meropenem, Foley exchange.  PICC line was placed, home health was arranged, the patient was discharged to follow-up with PCP in 1 week.             Discharge Instructions  Discharge Instructions    [facility] pharmacist to adjust dose for Vancomycin, Aminoglycosides and other anti-infective therapies as requested by physician.   Complete by: As directed    [facility] to provide Cath Flo 2mg    Complete by: As directed    Administer for PICC line occlusion and as ordered by physician for other access device issues.   Anaphylaxis Kit: Provided to treat any anaphylactic reaction to the medication being provided to the patient if First Dose or when  requested by physician   Complete by: As directed    Epinephrine 1mg /ml vial / amp: Administer 0.3mg  (0.66ml) subcutaneously once for moderate to severe anaphylaxis, nurse to call physician and pharmacy when reaction occurs and call 911 if needed for immediate care   Diphenhydramine 50mg /ml IV vial: Administer 25-50mg  IV/IM PRN for first dose reaction, rash, itching, mild reaction, nurse to call physician and pharmacy when reaction occurs   Sodium Chloride 0.9% NS 567ml IV: Administer if needed for hypovolemic blood pressure drop or as ordered by physician after call to physician with anaphylactic reaction   Change dressing on IV access line weekly and PRN   Complete by: As directed    Discharge instructions   Complete by: As directed    From Dr. Loleta Books: You were admitted with a bladder infection.   It turns out, this infection is with Pseudomonas aeruginosa, which is resistant to many antibiotics.  It requires an intravenous antibitoic.  Take meropenem 1g three times daily for the next 7 days then stop Remove the PICC line at the conclusion of therapy  Resume all your other home medicines Go see your primary care doctor in 1 week and have her draw labs to check your blood counts and metabolic panel   Flush IV access with Sodium Chloride 0.9% and Heparin 10 units/ml or 100 units/ml   Complete by: As directed    Home infusion instructions - [facility]   Complete by: As directed    Instructions: Flush IV access with Sodium Chloride 0.9% and Heparin 10units/ml or 100units/ml   Change dressing on  IV access line: Weekly and PRN   Instructions Cath Flo $Remove'2mg'hjvBMNZ$ : Administer for PICC Line occlusion and as ordered by physician for other access device   [facility] pharmacist to adjust dose for: Vancomycin, Aminoglycosides and other anti-infective therapies as requested by physician   Increase activity slowly   Complete by: As directed    Method of administration may be changed at  the discretion of home infusion pharmacist based upon assessment of the patient and/or caregiver's ability to self-administer the medication ordered   Complete by: As directed    No wound care   Complete by: As directed      Allergies as of 10/14/2020   No Known Allergies     Medication List    TAKE these medications   albuterol 108 (90 Base) MCG/ACT inhaler Commonly known as: VENTOLIN HFA Inhale 2 puffs into the lungs every 6 (six) hours as needed. What changed: reasons to take this   baclofen 10 MG tablet Commonly known as: LIORESAL 1/2 tablet in the morning and at noon, and 2 tablets at night What changed:   how much to take  how to take this  when to take this  additional instructions   buPROPion 300 MG 24 hr tablet Commonly known as: WELLBUTRIN XL Take 300 mg by mouth daily.   Dextromethorphan-quiNIDine 20-10 MG capsule Commonly known as: NUEDEXTA Take 1 tablet by mouth 2 (two) times daily.   GABAPENTIN PO Take 1 capsule by mouth as needed (pain).   meropenem  IVPB Commonly known as: MERREM Inject 1 g into the vein every 8 (eight) hours for 7 days. Indication:  UTI  First Dose: Yes Last Day of Therapy:  10/21/2020 Labs - Once weekly:  CBC/D and BMP, Labs - Every other week:  ESR and CRP Method of administration: Mini-Bag Plus / Gravity Method of administration may be changed at the discretion of home infusion pharmacist based upon assessment of the patient and/or caregiver's ability to self-administer the medication ordered.   modafinil 100 MG tablet Commonly known as: Provigil Take 1 tablet (100 mg total) by mouth in the morning.   ocrelizumab 600 mg in sodium chloride 0.9 % 500 mL Inject 600 mg into the vein every 6 (six) months.            Discharge Care Instructions  (From admission, onward)         Start     Ordered   10/14/20 0000  Change dressing on IV access line weekly and PRN  (Home infusion instructions - [facility] )         10/14/20 1326          Follow-up Information    Reeves Dam, MD. Schedule an appointment as soon as possible for a visit in 1 week(s).   Specialty: Internal Medicine       [facility] Follow up.   Why: Will arrange IV antibiotics and set Home Health RN Contact information: [phone]             No Known Allergies  Consultations:  Infectious disease   Procedures/Studies: [place] EKG SITE RITE  Result Date: 10/14/2020 If Site Rite image not attached, placement could not be confirmed due to current cardiac rhythm.      Subjective: Feeling well.  No fever, no confusion, no vomiting.  Discharge Exam: Vitals:   10/14/20 1400 10/14/20 1748  BP: 120/85 122/79  Pulse: 79 76  Resp: 16 17  Temp: 98.2  F (36.8 C) 98.3 F (36.8 C)  SpO2: 96% 98%   Vitals:   10/14/20 0600 10/14/20 0906 10/14/20 1400 10/14/20 1748  BP: 105/78 116/83 120/85 122/79  Pulse: 61 74 79 76  Resp: $Remo'16 14 16 17  'SAdEe$ Temp:  97.9 F (36.6 C) 98.2 F (36.8 C) 98.3 F (36.8 C)  TempSrc:  Oral Oral Oral  SpO2: 95% 97% 96% 98%  Weight:      Height:        General: Pt is alert, awake, not in acute distress, sitting in motorized wheelchair Cardiovascular: RRR, nl S1-S2, no murmurs appreciated.   No LE edema.   Respiratory: Normal respiratory rate and rhythm.  CTAB without rales or wheezes. Abdominal: Abdomen soft and non-tender.  No distension or HSM.   Neuro/Psych: Spastic quadriplegia.  Judgment and insight appear normal.   The results of significant diagnostics from this hospitalization (including imaging, microbiology, ancillary and laboratory) are listed below for reference.     Microbiology: Recent Results (from the past 240 hour(s))  Resp Panel by RT-PCR (Flu A&B, Covid) Nasopharyngeal Swab     Status: None   Collection Time: 10/13/20 11:01 PM   Specimen: Nasopharyngeal Swab; Nasopharyngeal(NP) swabs in vial transport medium  Result Value Ref Range Status   SARS  Coronavirus 2 by RT PCR NEGATIVE NEGATIVE Final    Comment: (NOTE) SARS-CoV-2 target nucleic acids are NOT DETECTED.  The SARS-CoV-2 RNA is generally detectable in upper respiratory specimens during the acute phase of infection. The lowest concentration of SARS-CoV-2 viral copies this assay can detect is 138 copies/mL. A negative result does not preclude SARS-Cov-2 infection and should not be used as the sole basis for treatment or other patient management decisions. A negative result may occur with  improper specimen collection/handling, submission of specimen other than nasopharyngeal swab, presence of viral mutation(s) within the areas targeted by this assay, and inadequate number of viral copies(<138 copies/mL). A negative result must be combined with clinical observations, patient history, and epidemiological information. The expected result is Negative.  Fact Sheet for Patients:  EntrepreneurPulse.com.au  Fact Sheet for Healthcare Providers:  IncredibleEmployment.be  This test is no t yet approved or cleared by the [place] FDA and  has been authorized for detection and/or diagnosis of SARS-CoV-2 by FDA under an Emergency Use Authorization (EUA). This EUA will remain  in effect (meaning this test can be used) for the duration of the COVID-19 declaration under Section 564(b)(1) of the Act, 21 U.S.C.section 360bbb-3(b)(1), unless the authorization is terminated  or revoked sooner.       Influenza A by PCR NEGATIVE NEGATIVE Final   Influenza B by PCR NEGATIVE NEGATIVE Final    Comment: (NOTE) The Xpert Xpress SARS-CoV-2/FLU/RSV plus assay is intended as an aid in the diagnosis of influenza from Nasopharyngeal swab specimens and should not be used as a sole basis for treatment. Nasal washings and aspirates are unacceptable for Xpert Xpress SARS-CoV-2/FLU/RSV testing.  Fact Sheet for  Patients: EntrepreneurPulse.com.au  Fact Sheet for Healthcare Providers: IncredibleEmployment.be  This test is not yet approved or cleared by the [place] FDA and has been authorized for detection and/or diagnosis of SARS-CoV-2 by FDA under an Emergency Use Authorization (EUA). This EUA will remain in effect (meaning this test can be used) for the duration of the COVID-19 declaration under Section 564(b)(1) of the Act, 21 U.S.C. section 360bbb-3(b)(1), unless the authorization is terminated or revoked.  Performed at [facility], [address],  [place] [address]      Labs: BNP (last 3 results) Recent Labs    02/25/20 0531 02/26/20 0451 02/27/20 0444  BNP 119.5* 135.9* 119.1*   Basic Metabolic Panel: Recent Labs  Lab 10/13/20 1520 10/14/20 0516  NA 138 133*  K 3.9 4.1  CL 103 102  CO2 26 22  GLUCOSE 90 80  BUN 13 12  CREATININE 1.05 1.06  CALCIUM 9.0 9.0   Liver Function Tests: Recent Labs  Lab 10/13/20 1520 10/14/20 0516  AST 16 16  ALT 16 16  ALKPHOS 117 114  BILITOT 0.8 1.4*  PROT 6.4* 6.2*  ALBUMIN 3.9 3.7   No results for input(s): LIPASE, AMYLASE in the last 168 hours. No results for input(s): AMMONIA in the last 168 hours. CBC: Recent Labs  Lab 10/13/20 1520 10/14/20 0516  WBC 9.1 6.6  NEUTROABS 7.4  --   HGB 15.3 15.1  HCT 45.4 44.0  MCV 91.2 90.0  PLT 283 257   Cardiac Enzymes: No results for input(s): CKTOTAL, CKMB, CKMBINDEX, TROPONINI in the last 168 hours. BNP: Invalid input(s): POCBNP CBG: No results for input(s): GLUCAP in the last 168 hours. D-Dimer No results for input(s): DDIMER in the last 72 hours. Hgb A1c No results for input(s): HGBA1C in the last 72 hours. Lipid Profile No results for input(s): CHOL, HDL, LDLCALC, TRIG, CHOLHDL, LDLDIRECT in the last 72 hours. Thyroid function studies No results for input(s): TSH, T4TOTAL, T3FREE, THYROIDAB in the last 72  hours.  Invalid input(s): FREET3 Anemia work up No results for input(s): VITAMINB12, FOLATE, FERRITIN, TIBC, IRON, RETICCTPCT in the last 72 hours. Urinalysis    Component Value Date/Time   COLORURINE YELLOW 10/13/2020 2228   APPEARANCEUR CLOUDY (A) 10/13/2020 2228   APPEARANCEUR Clear 07/29/2020 1429   LABSPEC 1.015 10/13/2020 2228   PHURINE 6.5 10/13/2020 2228   GLUCOSEU NEGATIVE 10/13/2020 2228   HGBUR LARGE (A) 10/13/2020 2228   BILIRUBINUR NEGATIVE 10/13/2020 2228   BILIRUBINUR Negative 07/29/2020 1429   KETONESUR NEGATIVE 10/13/2020 2228   PROTEINUR NEGATIVE 10/13/2020 2228   UROBILINOGEN 0.2 09/12/2018 1534   NITRITE POSITIVE (A) 10/13/2020 2228   LEUKOCYTESUR MODERATE (A) 10/13/2020 2228   Sepsis Labs Invalid input(s): PROCALCITONIN,  WBC,  LACTICIDVEN Microbiology Recent Results (from the past 240 hour(s))  Resp Panel by RT-PCR (Flu A&B, Covid) Nasopharyngeal Swab     Status: None   Collection Time: 10/13/20 11:01 PM   Specimen: Nasopharyngeal Swab; Nasopharyngeal(NP) swabs in vial transport medium  Result Value Ref Range Status   SARS Coronavirus 2 by RT PCR NEGATIVE NEGATIVE Final    Comment: (NOTE) SARS-CoV-2 target nucleic acids are NOT DETECTED.  The SARS-CoV-2 RNA is generally detectable in upper respiratory specimens during the acute phase of infection. The lowest concentration of SARS-CoV-2 viral copies this assay can detect is 138 copies/mL. A negative result does not preclude SARS-Cov-2 infection and should not be used as the sole basis for treatment or other patient management decisions. A negative result may occur with  improper specimen collection/handling, submission of specimen other than nasopharyngeal swab, presence of viral mutation(s) within the areas targeted by this assay, and inadequate number of viral copies(<138 copies/mL). A negative result must be combined with clinical observations, patient history, and epidemiological information. The  expected result is Negative.  Fact Sheet for Patients:  EntrepreneurPulse.com.au  Fact Sheet for Healthcare Providers:  IncredibleEmployment.be  This test is no t yet approved or cleared by the [place] FDA and  has been  authorized for detection and/or diagnosis of SARS-CoV-2 by FDA under an Emergency Use Authorization (EUA). This EUA will remain  in effect (meaning this test can be used) for the duration of the COVID-19 declaration under Section 564(b)(1) of the Act, 21 U.S.C.section 360bbb-3(b)(1), unless the authorization is terminated  or revoked sooner.       Influenza A by PCR NEGATIVE NEGATIVE Final   Influenza B by PCR NEGATIVE NEGATIVE Final    Comment: (NOTE) The Xpert Xpress SARS-CoV-2/FLU/RSV plus assay is intended as an aid in the diagnosis of influenza from Nasopharyngeal swab specimens and should not be used as a sole basis for treatment. Nasal washings and aspirates are unacceptable for Xpert Xpress SARS-CoV-2/FLU/RSV testing.  Fact Sheet for Patients: EntrepreneurPulse.com.au  Fact Sheet for Healthcare Providers: IncredibleEmployment.be  This test is not yet approved or cleared by the [place] FDA and has been authorized for detection and/or diagnosis of SARS-CoV-2 by FDA under an Emergency Use Authorization (EUA). This EUA will remain in effect (meaning this test can be used) for the duration of the COVID-19 declaration under Section 564(b)(1) of the Act, 21 U.S.C. section 360bbb-3(b)(1), unless the authorization is terminated or revoked.  Performed at [facility], [place] [address] [place], [place] [address]      Time coordinating discharge: 45  minutes      SIGNED:   Edwin Dada, MD  [facility] 10/14/2020, 7:57 PM

## 2020-10-14 NOTE — ED Notes (Addendum)
Per Dr. Maryfrances Bunnell, patient may be discharged from ED by RN once home infusion education is completed by Jeri Modena from [facility].

## 2020-10-14 NOTE — Progress Notes (Signed)
PHARMACY CONSULT NOTE FOR:  OUTPATIENT  PARENTERAL ANTIBIOTIC THERAPY (OPAT)  Indication: UTI Pseudomonas  Regimen: Meropenem 1 g q8h End date: 10/21/2020  IV antibiotic discharge orders are pended. To discharging provider:  please sign these orders via discharge navigator,  Select New Orders & click on the button choice - Manage This Unsigned Work.    Elmer Sow, PharmD, BCPS, BCCCP Clinical Pharmacist  Please check AMION for all [facility] numbers  10/14/2020 8:49 AM

## 2020-10-14 NOTE — ED Notes (Signed)
Asked patient if he would like food. He stated that he was not, stated that he does not eat in the morning. I informed him, if he needed something to eat, just let us know.

## 2020-10-14 NOTE — Consult Note (Signed)
[facility] for Infectious Disease    Date of Admission:  10/13/2020     Reason for Consult:  UTI     Referring Physician: Dr Loleta Books   ASSESSMENT:    #Pseudomonas urinary tract infection #Multiple sclerosis complicated by neurogenic bladder and need for indwelling Foley   PLAN:    --Meropenem 1 g every 8 hours x7 days.  End of therapy 10/21/2020 --Agree with Foley exchange --PICC line ordered  Diagnosis: UTI  Culture Result: Pseduomonas  No Known Allergies  OPAT Orders Discharge antibiotics to be given via PICC line Discharge antibiotics: Meropenem 1 gm q8h Per pharmacy protocol   Duration: 7 days  End Date: 10/21/20  [facility] - Inpatient Care Per Protocol:  Home health RN for IV administration and teaching; PICC line care and labs.    Labs weekly while on IV antibiotics: _x_ CBC with differential __x BMP __ CMP __ CRP __ ESR __ Vancomycin trough __ CK  x__ Please pull PIC at completion of IV antibiotics __ Please leave PIC in place until doctor has seen patient or been notified  Fax weekly labs to [phone]  Clinic Follow Up Appt: N/A       MEDICATIONS:    Scheduled Meds: . baclofen  20 mg Oral QHS  . baclofen  5 mg Oral 2 times per day  . buPROPion  300 mg Oral Daily  . Dextromethorphan-quiNIDine  1 capsule Oral BID  . enoxaparin (LOVENOX) injection  40 mg Subcutaneous Q24H  . modafinil  100 mg Oral q AM    Continuous Infusions: . meropenem (MERREM) IV      PRN Meds: acetaminophen **OR** acetaminophen, albuterol  HPI:    [name] is a 60 y.o. male past medical history of depression, cervical foraminal stenosis, multiple sclerosis with neurogenic bladder and quadriplegia, and asthma who presented from an urgent care facility yesterday for IV antibiotics.  Patient had several days of dark and foul-smelling urine and change in mental status/attitude/energy level noticed by his wife prompting urgent care visit 2 days ago where he was  started on oral ciprofloxacin.  His urine culture subsequently grew out Pseudomonas resistant to Cipro and he was sent to the ED for further evaluation.  He has not been having any fevers, however, he has also been having nausea and headaches similar to his prior UTI symptoms.  In the emergency department he was stable.  No leukocytosis, no lactic acidosis.  Repeat urinalysis and culture is pending.  He had a PICC line placed and is planning to be discharged today.  His chronic foley was last changed about 1.5 weeks ago.  His urine culture from urgent care was intermediate to cefepime, susceptible to meropenem.   Past Medical History:  Diagnosis Date  . Abnormality of gait 11/21/2015  . Asthma    childhood asthma  . Classic migraine   . Depression   . Dysphagia   . Hay fever   . Headache syndrome 12/22/2018  . MS (multiple sclerosis) ([place])   . 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 Smoking status: Former Research scientist (life sciences)  . Smokeless tobacco: Never Used  Substance Use Topics  . Alcohol use: No    Comment: rare   . Drug use: No    Family History  Problem Relation Age of Onset  . Cancer Father   . Multiple sclerosis Sister   . Seizures Maternal Uncle   . Parkinsonism Maternal Uncle   .  Multiple sclerosis Sister   . Multiple sclerosis Paternal Uncle   . Multiple sclerosis Other   . Lung cancer Other        parent  . Uterine cancer Other        other    No Known Allergies  Review of Systems  Constitutional: Positive for malaise/fatigue. Negative for chills and fever.  HENT: Negative.   Respiratory: Negative.   Cardiovascular: Negative.   Gastrointestinal: Positive for abdominal pain and nausea. Negative for vomiting.  Genitourinary: Negative.   Musculoskeletal: Positive for neck pain.  Skin: Negative.   Neurological: Positive for headaches.  All other systems reviewed and are negative.   OBJECTIVE:   Blood pressure 116/83, pulse 74, temperature  97.9 F (36.6 C), temperature source Oral, resp. rate 14, height $RemoveBe'5\' 10"'vqJrwUVmc$  (1.778 m), weight 90.7 kg, SpO2 97 %. Body mass index is 28.69 kg/m.  Physical Exam Constitutional:      General: He is not in acute distress.    Appearance: He is not ill-appearing.     Comments: Pleasant man sitting in motorized wheelchair.   HENT:     Head: Normocephalic and atraumatic.  Eyes:     Extraocular Movements: Extraocular movements intact.     Conjunctiva/sclera: Conjunctivae normal.  Cardiovascular:     Rate and Rhythm: Normal rate and regular rhythm.  Pulmonary:     Effort: Pulmonary effort is normal. No respiratory distress.     Breath sounds: Normal breath sounds.  Abdominal:     General: There is distension.     Palpations: Abdomen is soft.     Tenderness: There is no abdominal tenderness. There is no guarding or rebound.  Musculoskeletal:        General: No deformity.     Cervical back: Normal range of motion.  Skin:    General: Skin is warm and dry.  Neurological:     General: No focal deficit present.     Mental Status: He is oriented to person, place, and time.  Psychiatric:        Mood and Affect: Mood normal.        Behavior: Behavior normal.     Lab Results & Microbiology Lab Results  Component Value Date   WBC 6.6 10/14/2020   HGB 15.1 10/14/2020   HCT 44.0 10/14/2020   MCV 90.0 10/14/2020   PLT 257 10/14/2020    Lab Results  Component Value Date   NA 133 (L) 10/14/2020   K 4.1 10/14/2020   CO2 22 10/14/2020   GLUCOSE 80 10/14/2020   BUN 12 10/14/2020   CREATININE 1.06 10/14/2020   CALCIUM 9.0 10/14/2020   GFRNONAA >60 10/14/2020   GFRAA 84 07/29/2020    Lab Results  Component Value Date   ALT 16 10/14/2020   AST 16 10/14/2020   ALKPHOS 114 10/14/2020   BILITOT 1.4 (H) 10/14/2020    C-Reactive Protein     Component Value Date/Time   CRP 4.6 (H) 02/27/2020 0444    Erythrocyte Sedimentation Rate  No results found for: ESRSEDRATE    I have  reviewed the micro and lab results in Epic.  Imaging [place] EKG SITE RITE  Result Date: 10/14/2020 If Site Rite image not attached, placement could not be confirmed due to current cardiac rhythm.   Raynelle [facility] for Infectious [facility] [phone] pager 10/14/2020, 9:12 AM

## 2020-10-14 NOTE — ED Notes (Signed)
10:00 Nuedexta dose received via tube system from pharmacy. Sterile procedure in process at this time. Will administer medications and obtain vital signs when procedure is complete.

## 2020-10-14 NOTE — TOC Initial Note (Signed)
Transition of Care [facility]) - Initial/Assessment Note    Patient Details  Name: [name] MRN: [id number] Date of Birth: 1961-09-09  Transition of Care [facility]) CM/SW Contact:    Elliot Cousin, RN Phone Number: [phone] 10/14/2020, 2:39 PM  Clinical Narrative:                   TOC CM spoke to pt's wife via phone. Offered choice for [facility]. States she wants to use everything with [facility] as they will cover at 100%. Referral sent to Jeri Modena, [facility] RN with new referral. Attending updated and orders in Epic. Pt has PICC line. Pam will come to pt's room to provide teaching to pt and wife on IV abx at home.     Expected Discharge Plan: Home w Home Health Services Barriers to Discharge: No Barriers Identified   Patient Goals and CMS Choice Patient states their goals for this hospitalization and ongoing recovery are:: will need IV abx at home CMS Medicare.gov Compare Post Acute Care list provided to:: Patient Represenative (must comment) ([name] Koeppen-wife) Choice offered to / list presented to : Spouse  Expected Discharge Plan and Services Expected Discharge Plan: Home w Home Health Services In-house Referral: Clinical Social Work Discharge Planning Services: CM Consult Post Acute Care Choice: Home Health   Expected Discharge Date: 10/14/20                         HH Arranged: RN HH Agency: [facility] Date HH Agency Contacted: 10/14/20 Time HH Agency Contacted: 1438 Representative spoke with at [facility] Agency: Jeri Modena  Prior Living Arrangements/Services   Lives with:: Spouse Patient language and need for interpreter reviewed:: Yes Do you feel safe going back to the place where you live?: Yes      Need for Family Participation in Patient Care: Yes (Comment) Care giver support system in place?: Yes (comment)   Criminal Activity/Legal Involvement Pertinent to Current Situation/Hospitalization: No - Comment as needed  Activities of Daily Living       Permission Sought/Granted Permission sought to share information with : Case Manager,PCP,Family Supports Permission granted to share information with : Yes, Verbal Permission Granted  Share Information with NAME: [name]  Permission granted to share info w AGENCY: Home Health  Permission granted to share info w Relationship: wife  Permission granted to share info w Contact Information: [phone]  Emotional Assessment           Psych Involvement: No (comment)  Admission diagnosis:  UTI (urinary tract infection) [N39.0] Patient Active Problem List   Diagnosis Date Noted  . UTI (urinary tract infection) 10/13/2020  . Lactic acidosis 02/23/2020  . Sepsis due to Enterococcus (HCC) 02/23/2020  . Enterococcus UTI 02/23/2020  . Hyperkalemia 02/23/2020  . Penile bleeding 02/23/2020  . Acute respiratory failure with hypoxemia (HCC) 02/23/2020  . 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 Restrictive lung disease 09/06/2017  . Depression 09/06/2017  . MS (multiple sclerosis) (HCC) 08/13/2017  . Dyspnea 08/13/2017  . 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 Abnormality of gait 11/21/2015  . Transient alteration of awareness 11/21/2015  . Neck pain 05/10/2015  . Foraminal stenosis of cervical region 05/10/2015  . Meralgia paresthetica, tx by Dr. Sandria Manly 09/21/2012  . Neurogenic bladder 09/21/2012  . Multiple sclerosis, secondary progressive (HCC) 08/04/2012   PCP:  Agustina Caroli, MD Pharmacy:   [phone] [place], [place] - [address] AT [address] OF [facility]  [address] [place] [address] Phone: [phone] Fax: [phone]  [facility] - [place], [place] - [address] [place] [address] Phone: [phone] Fax: [phone]  [facility] - [place], [place] - [address] [facility] [address] [facility] [address] [place] [address] Phone: [phone] Fax:  [phone]     Social Determinants of Health (SDOH) Interventions    Readmission Risk Interventions No flowsheet data found.

## 2020-10-14 NOTE — ED Notes (Signed)
This RN went in to introduce herself to the pt. Pt was sitting in his electric wheelchair without a stretcher or hospital bed in the room. This RN asked pt if he was comfortable in his chair or if he would like for [place] to get him a hospital bed. Pt stated "He was fine and was requesting to stay in his electric wheelchair at this time." This RN informed him that, that would be fine but at any point if he wanted to get into the bed to please let a staff member know. Will continue to monitor pt.

## 2020-10-14 NOTE — Progress Notes (Signed)
Peripherally Inserted Central Catheter Placement  The IV Nurse has discussed with the patient and/or persons authorized to consent for the patient, the purpose of this procedure and the potential benefits and risks involved with this procedure.  The benefits include less needle sticks, lab draws from the catheter, and the patient may be discharged home with the catheter. Risks include, but not limited to, infection, bleeding, blood clot (thrombus formation), and puncture of an artery; nerve damage and irregular heartbeat and possibility to perform a PICC exchange if needed/ordered by physician.  Alternatives to this procedure were also discussed.  Bard Power PICC patient education guide, fact sheet on infection prevention and patient information card has been provided to patient /or left at bedside  Consent obtained with wife at bedside.    PICC Placement Documentation  PICC Single Lumen 10/14/20 PICC Left Basilic 47 cm 1 cm (Active)  Indication for Insertion or Continuance of Line Home intravenous therapies (PICC only) 10/14/20 1200  Exposed Catheter (cm) 1 cm 10/14/20 1200  Site Assessment Clean;Dry;Intact 10/14/20 1200  Line Status Infusing;Saline locked;Blood return noted 10/14/20 1200  Dressing Type Transparent;Securing device 10/14/20 1200  Dressing Status Clean;Dry;Intact 10/14/20 1200  Antimicrobial disc in place? Yes 10/14/20 1200  Safety Lock Not Applicable 10/14/20 1200  Line Care Connections checked and tightened 10/14/20 1200  Dressing Intervention New dressing 10/14/20 1200  Dressing Change Due 10/21/20 10/14/20 1200       Franne Grip Renee 10/14/2020, 12:57 PM

## 2020-10-14 NOTE — Progress Notes (Signed)
Pharmacy Antibiotic Note  [name] is a 60 y.o. male with complicated UTI - grew pseudomonas at an urgent care; resistant to cipro  Height: 5\' 10"  (177.8 cm) Weight: 90.7 kg (199 lb 15.3 oz) IBW/kg (Calculated) : 73  Temp (24hrs), Avg:98.1 F (36.7 C), Min:97.7 F (36.5 C), Max:98.6 F (37 C)  Recent Labs  Lab 10/13/20 1520 10/14/20 0516  WBC 9.1 6.6  CREATININE 1.05 1.06  LATICACIDVEN 1.0 1.2    Estimated Creatinine Clearance: 87.1 mL/min (by C-G formula based on SCr of 1.06 mg/dL).    No Known Allergies  Plan: Meropenem 1 g q8h OPAT entered  12/12/20, PharmD, BCPS, BCCCP Clinical Pharmacist  Please check AMION for all [facility] numbers  10/14/2020 8:51 AM

## 2020-10-15 ENCOUNTER — Telehealth: Payer: Self-pay | Admitting: Cardiology

## 2020-10-15 NOTE — Telephone Encounter (Signed)
Judeth Cornfield has been made aware that the patient has not been seen in this office in two years and to please reach out to the patient's PCP concerning this.

## 2020-10-15 NOTE — Telephone Encounter (Signed)
    Judeth Cornfield with [facility] calling she needs order approval from Dr. Cristal Deer for pt. One visit for one week for PICC line antibiotic

## 2020-10-17 LAB — URINE CULTURE: Culture: >=100000 — AB

## 2020-10-17 IMAGING — CT CT ABD-PELV W/ CM
2 of 5 series · 16 of 46 positions shown, 18 images · IV contrast (omnipaque)
Comparison: Chest CT 09/11/2017

CLINICAL DATA: Respiratory failure and possible sepsis.

EXAM:
CT ANGIOGRAPHY CHEST
CT ABDOMEN AND PELVIS WITH CONTRAST
TECHNIQUE: Multidetector CT imaging of the chest was performed using the
standard protocol during bolus administration of intravenous
contrast. Multiplanar CT image reconstructions and MIPs were
obtained to evaluate the vascular anatomy. Multidetector CT imaging
of the abdomen and pelvis was performed using the standard protocol
during bolus administration of intravenous contrast.
CONTRAST:  100mL OMNIPAQUE IOHEXOL 350 MG/ML SOLN

[Series 11: abd/ pelvis 5.0 i30f 1 · axial · 0.83mm/px · z∈[-573,-93]mm · 13 of 108 slices shown, 15 images]
[im 6/108  soft-tissue]
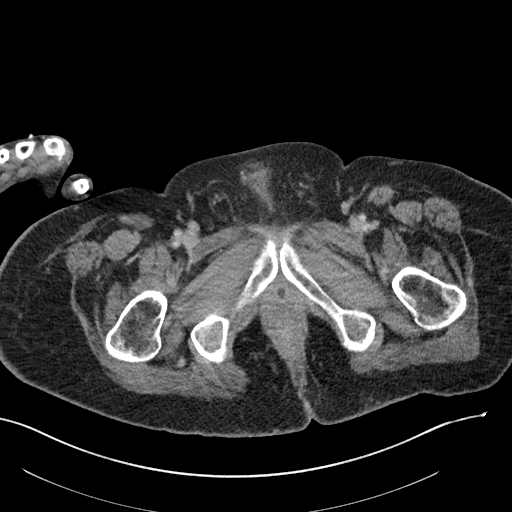
[im 6/108  bone]
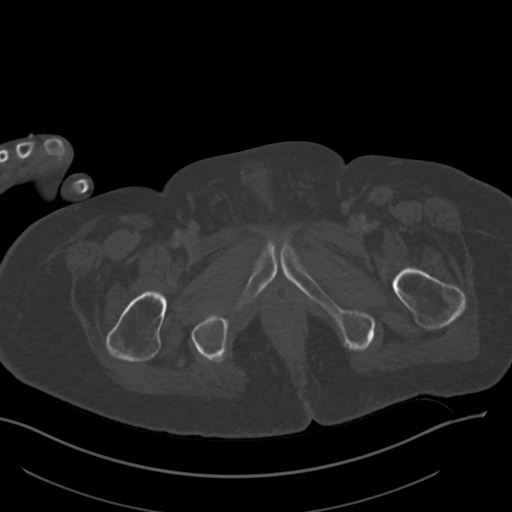
[im 16/108  soft-tissue]
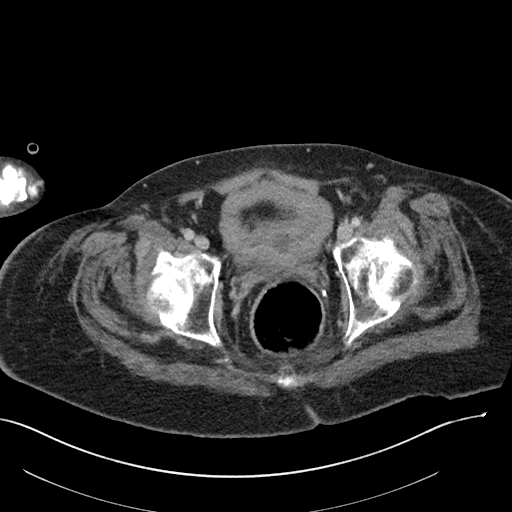
[im 21/108  soft-tissue]
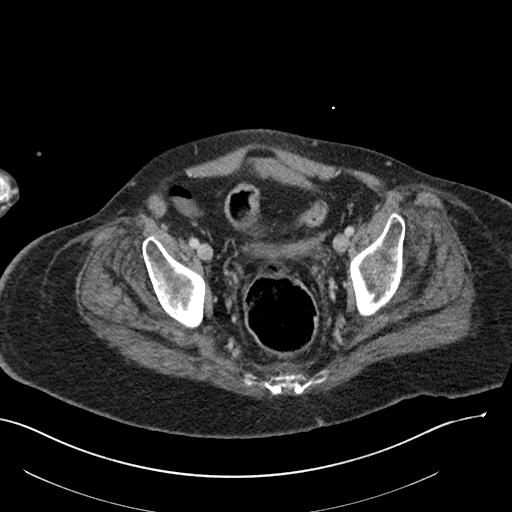
[im 31/108  soft-tissue]
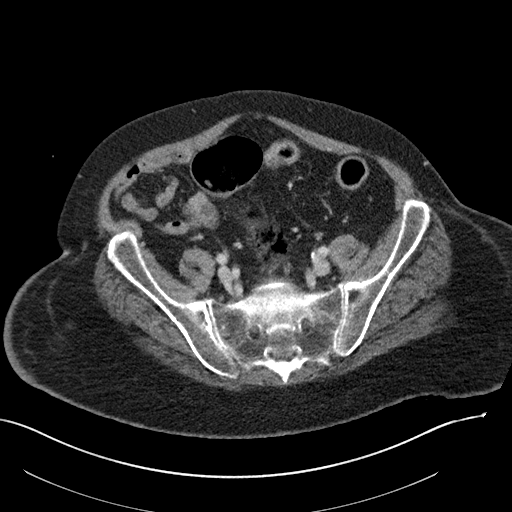
[im 36/108  soft-tissue]
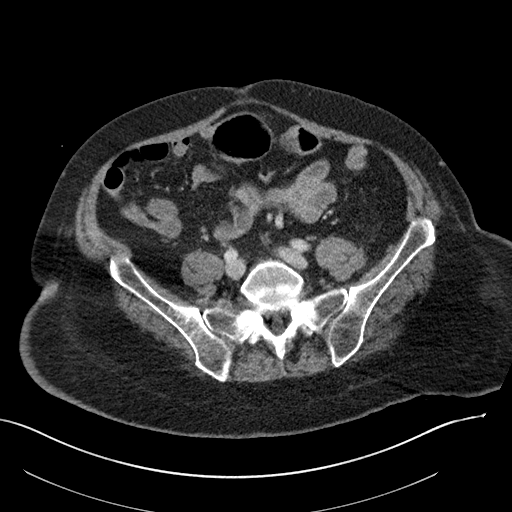
[im 46/108  soft-tissue]
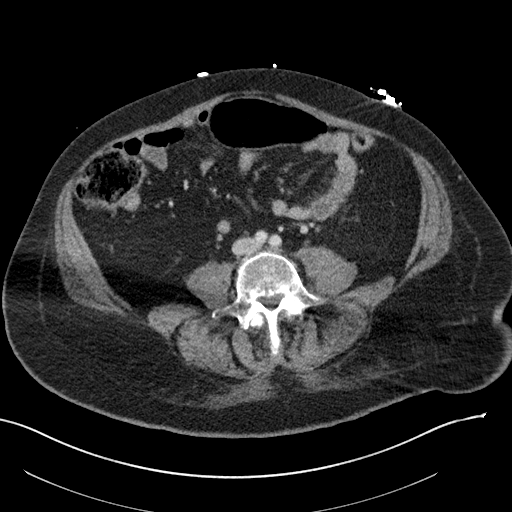
[im 57/108  soft-tissue]
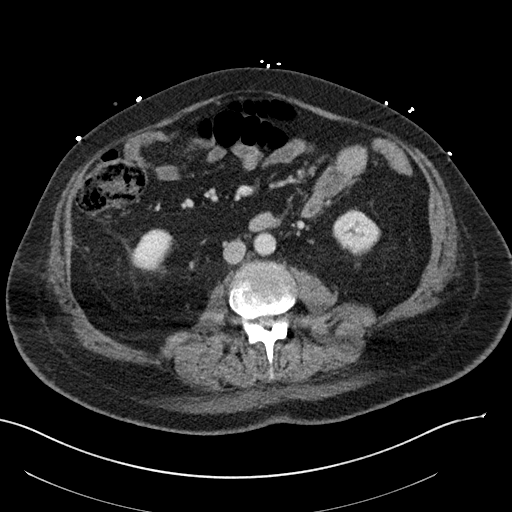
[im 62/108  soft-tissue]
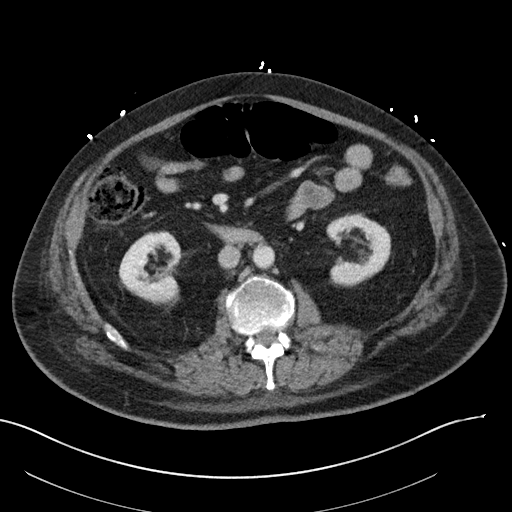
[im 72/108  soft-tissue]
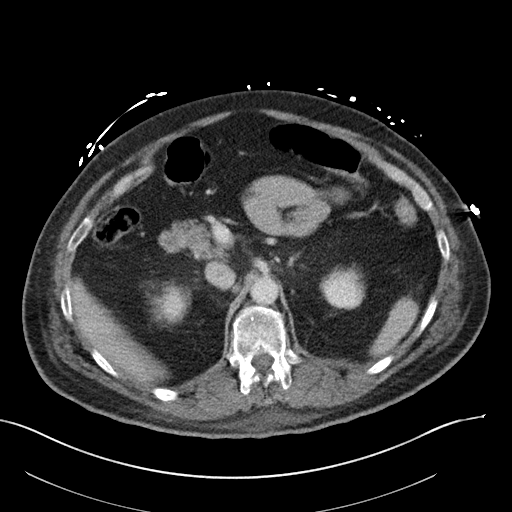
[im 72/108  bone]
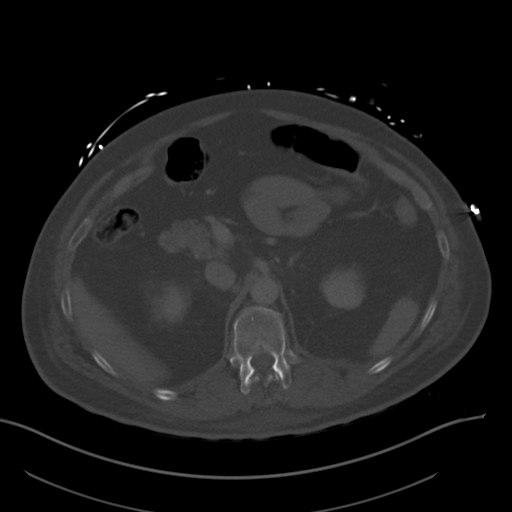
[im 77/108  soft-tissue]
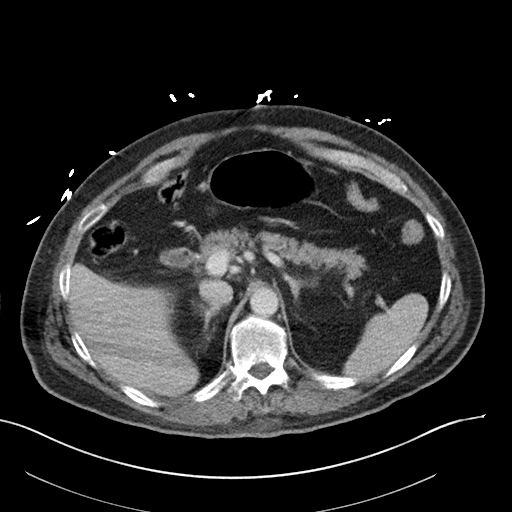
[im 87/108  soft-tissue]
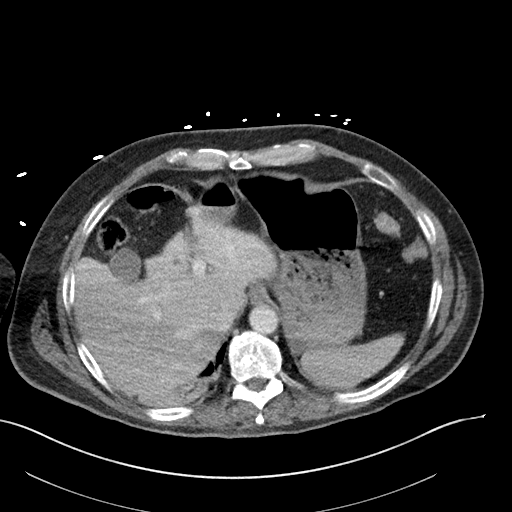
[im 92/108  soft-tissue]
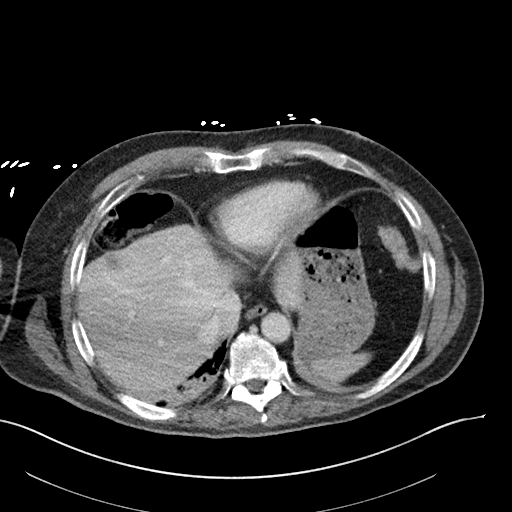
[im 102/108  soft-tissue]
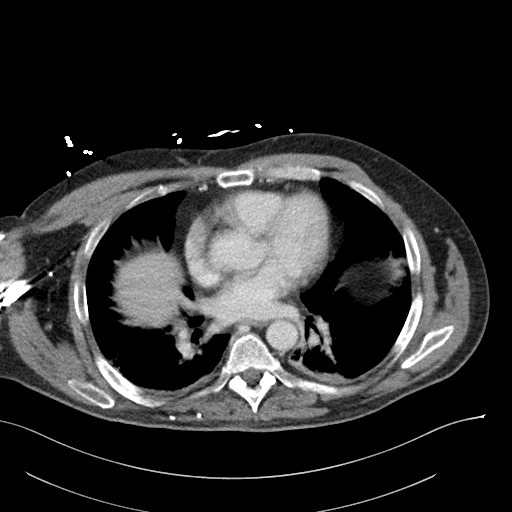

[Series 14: coronals · coronal · 0.96mm/px · 3 of 101 slices shown]
[im 34/101  soft-tissue]
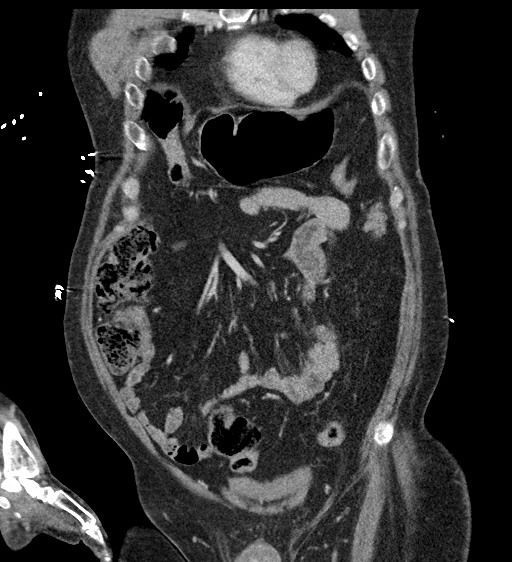
[im 45/101  soft-tissue]
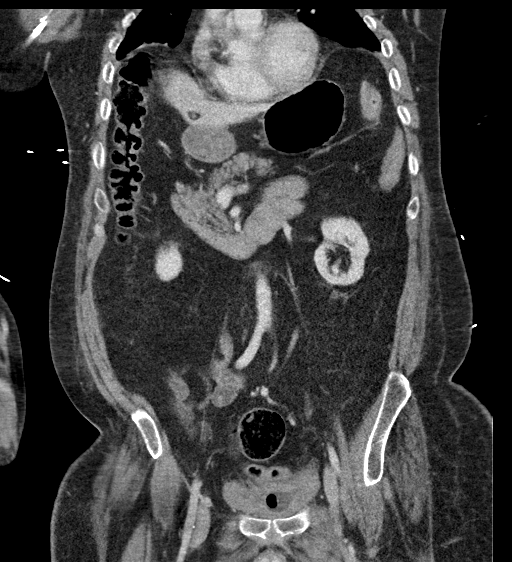
[im 56/101  soft-tissue]
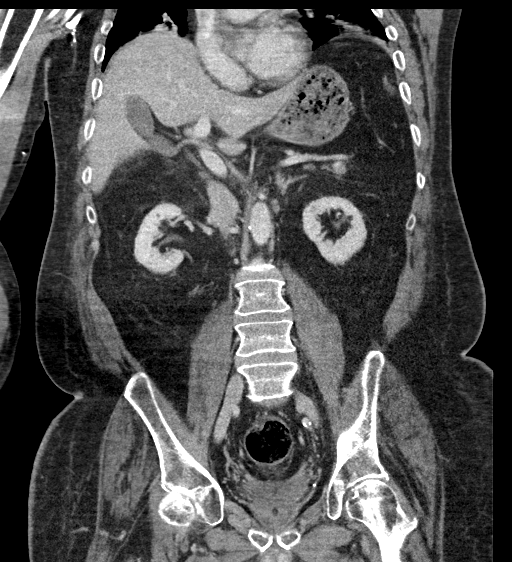

[16 of 46 positions shown; findings below may reference images not displayed]

FINDINGS: CTA CHEST FINDINGS

Cardiovascular: Satisfactory opacification of the pulmonary arteries
to the segmental level. No evidence of pulmonary embolism. Normal
heart size. No pericardial effusion.

Mediastinum/Nodes: Negative for adenopathy or mass

Lungs/Pleura: Low volume chest with streaky opacity in the lower
lungs consistent with atelectasis or scarring, also noted on prior
but more extensive today.

Musculoskeletal: No acute finding

Review of the MIP images confirms the above findings.

CT ABDOMEN and PELVIS FINDINGS

Hepatobiliary: Subcapsular low-density in the high right liver
measuring 16 mm, stable from 9592 and thus considered benign.No
evidence of biliary obstruction or stone.

Pancreas: Unremarkable.

Spleen: Unremarkable.

Adrenals/Urinary Tract: Negative adrenals. No hydronephrosis or
stone. Collapsed bladder around a Foley catheter.

Stomach/Bowel: No obstruction. No appendicitis or other visible
bowel inflammation.

Vascular/Lymphatic: No acute vascular abnormality. Mild
atherosclerosis. No mass or adenopathy.

Reproductive:No pathologic findings.

Other: No ascites or pneumoperitoneum.

Musculoskeletal: No acute abnormalities.  Chronic L5 pars defects

Review of the MIP images confirms the above findings.
IMPRESSION: Chest CTA:

1. Negative for pulmonary embolism.
2. Very low volume chest with atelectasis/scarring.

Abdominal CT:

No acute finding or explanation for history of sepsis.

## 2020-10-30 ENCOUNTER — Telehealth: Payer: Self-pay | Admitting: Neurology

## 2020-10-30 NOTE — Telephone Encounter (Signed)
Pt.'s wife Lawernce Keas is not on DPR. [facility] states [facility] to [place] Friday for  modafinil (PROVIGIL) 100 MG tablet. She is asking what is the progress on this. Please advise.  [phone]

## 2020-10-31 NOTE — Telephone Encounter (Signed)
PA  Sent via CMM, for modafinil  Sent as urgent review  KeyZackery Barefoot - PA Case ID: [id number] - Rx #: [id number] Pending

## 2020-10-31 NOTE — Telephone Encounter (Signed)
This request has been approved.  A Case: [id number], Status: Approved, Coverage Starts on: 10/12/2020 12:00:00 AM, Coverage Ends on: 10/11/2021 12:00:00 AM. Questions? Contact [phone].

## 2020-11-19 DIAGNOSIS — N390 Urinary tract infection, site not specified: Secondary | ICD-10-CM | POA: Diagnosis not present

## 2020-11-27 ENCOUNTER — Encounter: Payer: Self-pay | Admitting: Neurology

## 2020-11-29 ENCOUNTER — Other Ambulatory Visit: Payer: Self-pay | Admitting: Neurology

## 2021-01-20 ENCOUNTER — Ambulatory Visit (INDEPENDENT_AMBULATORY_CARE_PROVIDER_SITE_OTHER): Payer: Medicare Other | Admitting: Neurology

## 2021-01-20 ENCOUNTER — Encounter: Payer: Self-pay | Admitting: Neurology

## 2021-01-20 VITALS — BP 119/83 | HR 65 | Ht 66.0 in | Wt 205.0 lb

## 2021-01-20 DIAGNOSIS — R269 Unspecified abnormalities of gait and mobility: Secondary | ICD-10-CM | POA: Diagnosis not present

## 2021-01-20 DIAGNOSIS — F482 Pseudobulbar affect: Secondary | ICD-10-CM

## 2021-01-20 DIAGNOSIS — Z5181 Encounter for therapeutic drug level monitoring: Secondary | ICD-10-CM | POA: Diagnosis not present

## 2021-01-20 DIAGNOSIS — G8250 Quadriplegia, unspecified: Secondary | ICD-10-CM

## 2021-01-20 DIAGNOSIS — G35 Multiple sclerosis: Secondary | ICD-10-CM | POA: Diagnosis not present

## 2021-01-20 DIAGNOSIS — N319 Neuromuscular dysfunction of bladder, unspecified: Secondary | ICD-10-CM | POA: Diagnosis not present

## 2021-01-20 MED ORDER — GABAPENTIN 100 MG PO CAPS: 100.0000 mg | ORAL_CAPSULE | Freq: Every day | ORAL | 3 refills | Status: DC

## 2021-01-20 NOTE — Progress Notes (Signed)
Reason for visit: Multiple sclerosis  [name] is an 60 y.o. male  History of present illness:  [name] is a 60 year old right-handed white male with a history of multiple sclerosis with spastic quadriparesis.  The patient has some function of the left arm, very minimal function of the right arm and no function of the lower extremities.  The patient is having some issues after eating; for about 2 hours he will feel bad, he has to sit back in the chair, and he feels zoned out.  The patient has had some issues with urinary tract infections, he has a chronic Pseudomonas infection of the bladder.  The patient is coming off of Wellbutrin and is being switched to Celexa.  The use of Nuedexta seems to be effective in controlling his pseudobulbar affect.  The patient has some chronic neck and right shoulder discomfort, if he sneezes he may go into spasms throughout the body.  The patient uses gabapentin, only 100 mg at night, he takes baclofen if needed.  He does not take this regularly.  Past Medical History:  Diagnosis Date  . Abnormality of gait 11/21/2015  . Asthma    childhood asthma  . Classic migraine   . Depression   . Dysphagia   . Hay fever   . Headache syndrome 12/22/2018  . MS (multiple sclerosis) (HCC)   . 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 eye surgeries     x 2; bilateral 74 and 68    Family History  Problem Relation Age of Onset  . Cancer Father   . Multiple sclerosis Sister   . Seizures Maternal Uncle   . Parkinsonism Maternal Uncle   . Multiple sclerosis Sister   . Multiple sclerosis Paternal Uncle   . Multiple sclerosis Other   . Lung cancer Other        parent  . Uterine cancer Other        other    Social history:  reports that he has quit smoking. He has never used smokeless tobacco. He reports that he does not drink alcohol and does not use drugs.   No Known Allergies  Medications:  Prior  to Admission medications   Medication Sig Start Date End Date Taking? Authorizing Provider  albuterol (PROVENTIL HFA;VENTOLIN HFA) 108 (90 Base) MCG/ACT inhaler Inhale 2 puffs into the lungs every 6 (six) hours as needed. Patient taking differently: Inhale 2 puffs into the lungs every 6 (six) hours as needed for wheezing or shortness of breath. 07/19/18  Yes Terressa Koyanagi, DO  baclofen (LIORESAL) 10 MG tablet 1/2 tablet in the morning and at noon, and 2 tablets at night Patient taking differently: Take 5-10 mg by mouth See admin instructions. Take 5mg  in the morning and at noon, and 10mg  at night. 05/20/16  Yes , MD  Dextromethorphan-Quinidine 20-10 MG CAPS Take 1 tablet by mouth 2 (two) times daily. 05/27/17  Yes York Spaniel, MD  escitalopram (LEXAPRO) 10 MG tablet Take 10 mg by mouth daily.   Yes [provider]  GABAPENTIN PO Take 1 capsule by mouth as needed (pain).   Yes [provider]  methenamine (HIPREX) 1 g tablet Take 1 g by mouth 2 (two) times daily with a meal.   Yes [provider]  modafinil (PROVIGIL) 100 MG tablet TAKE 1 TABLET (100 MG TOTAL) BY MOUTH IN THE MORNING. *SENT FOR PA* 11/29/20  Yes York Spaniel, MD  ocrelizumab 600 mg in sodium chloride 0.9 % 500 mL Inject 600 mg into the vein every 6 (six) months.    Yes [provider]  buPROPion (WELLBUTRIN XL) 300 MG 24 hr tablet Take 300 mg by mouth daily.    [provider]    ROS:  Out of a complete 14 system review of symptoms, the patient complains only of the following symptoms, and all other reviewed systems are negative.  Neck pain Spasms Stomach discomfort following eating Weakness Double vision  Blood pressure 119/83, pulse 65, height 5\' 6"  (1.676 m), weight 205 lb (93 kg).  Physical Exam  General: The patient is alert and cooperative at the time of the examination.  Skin: No significant peripheral edema is noted.   Neurologic  Exam  Mental status: The patient is alert and oriented x 3 at the time of the examination. The patient has apparent normal recent and remote memory, with an apparently normal attention span and concentration ability.   Cranial nerves: Facial symmetry is present. Speech is slightly dysarthric, ataxic, not aphasic. Extraocular movements are full, with exception of there is incomplete adduction of the left eye with rightward gaze. Visual fields are full.  Pupils are equal, round, and reactive to light.  Discs are flat bilaterally.  Motor: The patient has 4/5 strength with grip and with flexion and extension of the elbow on the left arm, he is able to elevate the left arm.  With the right arm, he has slight grip, unable otherwise to flex and extend much at the elbow or elevate the arm.  The patient is able to extend the right leg slightly with support of the knee, otherwise no significant function of either lower extremity.  Sensory examination: Soft touch sensation is decreased on the right arm and leg as compared to the left.  Coordination: The patient has the ability to perform finger-nose-finger with the left arm with slight dysmetria, unable perform on the right, cannot perform heel-to-shin on either side.  Gait and station: The patient is nonambulatory, wheelchair-bound  Reflexes: Deep tendon reflexes are symmetric.   Assessment/Plan:  1.  Multiple sclerosis  2.  Spastic quadriparesis  3.  Neurogenic bladder  4.  Pseudobulbar affect  The patient will continue the Ocrevus.  He may be able to go up on the gabapentin dose, it is not clear whether he is on the 300 or the 100 mg capsule, taking only 1 at night.  The patient will have blood work done today.  He will have MRI evaluation of the brain and cervical spine done through the in the next couple weeks.  I have asked that they send me a report.  He is coming off of Wellbutrin, being switched to Lexapro.  He will follow up here in 6  months.  [place] MD 01/20/2021 12:00 PM  [facility] [address] [place], [place]  Phone [phone] Fax [phone]

## 2021-07-16 ENCOUNTER — Emergency Department (HOSPITAL_BASED_OUTPATIENT_CLINIC_OR_DEPARTMENT_OTHER): Payer: No Typology Code available for payment source

## 2021-07-16 ENCOUNTER — Other Ambulatory Visit: Payer: Self-pay

## 2021-07-16 ENCOUNTER — Emergency Department (HOSPITAL_BASED_OUTPATIENT_CLINIC_OR_DEPARTMENT_OTHER)
Admission: EM | Admit: 2021-07-16 | Discharge: 2021-07-16 | Disposition: A | Discharge status: Home / Self Care | Payer: No Typology Code available for payment source | Attending: Emergency Medicine | Admitting: Emergency Medicine

## 2021-07-16 ENCOUNTER — Encounter (HOSPITAL_BASED_OUTPATIENT_CLINIC_OR_DEPARTMENT_OTHER): Payer: Self-pay | Admitting: Obstetrics and Gynecology

## 2021-07-16 DIAGNOSIS — J45909 Unspecified asthma, uncomplicated: Secondary | ICD-10-CM | POA: Diagnosis not present

## 2021-07-16 DIAGNOSIS — N3090 Cystitis, unspecified without hematuria: Secondary | ICD-10-CM | POA: Insufficient documentation

## 2021-07-16 DIAGNOSIS — R4182 Altered mental status, unspecified: Secondary | ICD-10-CM | POA: Diagnosis not present

## 2021-07-16 DIAGNOSIS — N3000 Acute cystitis without hematuria: Secondary | ICD-10-CM | POA: Diagnosis not present

## 2021-07-16 DIAGNOSIS — Z87891 Personal history of nicotine dependence: Secondary | ICD-10-CM | POA: Diagnosis not present

## 2021-07-16 DIAGNOSIS — Z79899 Other long term (current) drug therapy: Secondary | ICD-10-CM | POA: Diagnosis not present

## 2021-07-16 DIAGNOSIS — R630 Anorexia: Secondary | ICD-10-CM | POA: Diagnosis present

## 2021-07-16 LAB — BASIC METABOLIC PANEL
Anion gap: 8 (ref 5–15)
BUN: 12 mg/dL (ref 6–20)
CO2: 28 mmol/L (ref 22–32)
Calcium: 9.4 mg/dL (ref 8.9–10.3)
Chloride: 104 mmol/L (ref 98–111)
Creatinine, Ser: 0.88 mg/dL (ref 0.61–1.24)
GFR, Estimated: >60 mL/min (ref >60)
Glucose, Bld: 83 mg/dL (ref 70–99)
Potassium: 4.2 mmol/L (ref 3.5–5.1)
Sodium: 140 mmol/L (ref 135–145)

## 2021-07-16 LAB — CBC WITH DIFFERENTIAL/PLATELET
Abs Immature Granulocytes: 0.03 10*3/uL (ref 0.00–0.07)
Basophils Absolute: 0 10*3/uL (ref 0.0–0.1)
Basophils Relative: 0 %
Eosinophils Absolute: 0.2 10*3/uL (ref 0.0–0.5)
Eosinophils Relative: 2 %
HCT: 43.8 % (ref 39.0–52.0)
Hemoglobin: 14.3 g/dL (ref 13.0–17.0)
Immature Granulocytes: 0 %
Lymphocytes Relative: 8 %
Lymphs Abs: 0.8 10*3/uL (ref 0.7–4.0)
MCH: 30.3 pg (ref 26.0–34.0)
MCHC: 32.6 g/dL (ref 30.0–36.0)
MCV: 92.8 fL (ref 80.0–100.0)
Monocytes Absolute: 0.9 10*3/uL (ref 0.1–1.0)
Monocytes Relative: 8 %
Neutro Abs: 8.4 10*3/uL — ABNORMAL HIGH (ref 1.7–7.7)
Neutrophils Relative %: 82 %
Platelets: 241 10*3/uL (ref 150–400)
RBC: 4.72 MIL/uL (ref 4.22–5.81)
RDW: 13.6 % (ref 11.5–15.5)
WBC: 10.4 10*3/uL (ref 4.0–10.5)
nRBC: 0 % (ref 0.0–0.2)

## 2021-07-16 LAB — URINALYSIS, ROUTINE W REFLEX MICROSCOPIC
Bilirubin Urine: NEGATIVE
Glucose, UA: NEGATIVE mg/dL
Hgb urine dipstick: NEGATIVE
Ketones, ur: NEGATIVE mg/dL
Nitrite: NEGATIVE
Protein, ur: NEGATIVE mg/dL
Specific Gravity, Urine: 1.005 (ref 1.005–1.030)
pH: 7.5 (ref 5.0–8.0)

## 2021-07-16 LAB — LACTIC ACID, PLASMA: Lactic Acid, Venous: 0.9 mmol/L (ref 0.5–1.9)

## 2021-07-16 MED ORDER — CIPROFLOXACIN HCL 500 MG PO TABS: 500.0000 mg | ORAL_TABLET | Freq: Two times a day (BID) | ORAL | 0 refills | Status: DC

## 2021-07-16 MED ORDER — AMOXICILLIN 500 MG PO TABS: 500.0000 mg | ORAL_TABLET | Freq: Two times a day (BID) | ORAL | 0 refills | Status: DC

## 2021-07-16 NOTE — ED Triage Notes (Signed)
Patient reports to the ER for recurrent UTI. Patient has a hx of MS and his family reports the lethargy and spasms occur when he has a UTI

## 2021-07-16 NOTE — Discharge Instructions (Addendum)
Please follow-up with your primary care and infectious disease providers. Take antibiotics as directed. Your urine culture results will be available in 2-3 days. You will be contacted if a change in antibiotic is needed.

## 2021-07-16 NOTE — ED Provider Notes (Signed)
[facility] EMERGENCY DEPT Provider Note   CSN: [id number] Arrival date & time: 07/16/21  1043     History Chief Complaint  Patient presents with   Recurrent UTI    [name] is a 60 y.o. male.  60 year old male with history of MS, recurrent UTI. Patient with loss of appetite, increased spasticity and mental "fogginess" with speech difficulty x 1 week. Wife indicates he commonly exhibits these symptoms when UTI presents. He has in indwelling catheter. Prior history of pseudomonas infections. He has responded well to combination of amoxicillin and cipro previously.  The history is provided by the patient, medical records and the spouse. No language interpreter was used.  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    Patient Active Problem List   Diagnosis Date Noted   UTI (urinary tract infection) 10/13/2020   Lactic acidosis 02/23/2020   Sepsis due to Enterococcus (HCC) 02/23/2020   Enterococcus UTI 02/23/2020   Hyperkalemia 02/23/2020   Penile bleeding 02/23/2020   Acute respiratory failure with hypoxemia (HCC) 02/23/2020   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    Past Surgical History:  Procedure Laterality Date   eye surgeries     x 2; bilateral 74 and 76       Family History  Problem Relation Age of Onset   Cancer Father    Multiple sclerosis Sister    Seizures Maternal Uncle    Parkinsonism Maternal Uncle    Multiple sclerosis Sister    Multiple sclerosis Paternal Uncle    Multiple sclerosis Other    Lung cancer Other        parent   Uterine cancer Other        other    Social History   Tobacco Use   Smoking status: Former   Smokeless tobacco: Never  Substance Use Topics   Alcohol use: No    Comment: rare    Drug use: No    Home Medications Prior to Admission medications   Medication Sig Start Date End Date Taking? Authorizing Provider  Ascorbic Acid (VITAMIN C) 100 MG tablet Take 100 mg by mouth daily.   Yes [provider]  cholecalciferol (VITAMIN D3) 25 MCG (1000 UNIT) tablet Take 1,000 Units by mouth daily.   Yes [provider]  D-MANNOSE PO Take 1,300 mg by mouth See admin instructions. 1300 mg with cranberry extract for uti   Yes [provider]  Dextromethorphan-Quinidine 20-10 MG CAPS Take 1 tablet by mouth 2 (two) times daily. 05/27/17  Yes York Spaniel, MD  escitalopram (LEXAPRO) 10 MG tablet Take 10 mg by mouth daily.   Yes [provider]  gabapentin (NEURONTIN) 100 MG capsule Take 1 capsule (100 mg total) by mouth at bedtime. 01/20/21  Yes York Spaniel, MD  methenamine (HIPREX) 1 g tablet Take 1 g by mouth 2 (two) times daily with a meal.   Yes [provider]  modafinil (PROVIGIL) 100 MG tablet TAKE 1 TABLET (100 MG TOTAL) BY MOUTH IN THE MORNING. *SENT FOR PA* 11/29/20  Yes York Spaniel, MD  ocrelizumab 600 mg in sodium chloride 0.9 % 500 mL Inject 600 mg into the vein every 6 (six) months.    Yes [provider]  albuterol (PROVENTIL HFA;VENTOLIN HFA) 108 (90  Base) MCG/ACT inhaler Inhale 2 puffs into the lungs every 6 (six) hours as needed. Patient taking differently: Inhale 2 puffs into the lungs every 6 (six) hours as needed for wheezing or shortness of breath. 07/19/18   Terressa Koyanagi, DO  baclofen (LIORESAL) 10 MG tablet 1/2 tablet in the morning and at noon, and 2 tablets at night Patient taking differently: Take 5-10 mg by mouth See admin instructions. Take 5mg  in the morning and at noon, and 10mg  at night. 05/20/16   , MD    Allergies    Patient has no known allergies.  Review of Systems   Review of Systems  Constitutional:  Positive for activity change and appetite change. Negative for fever.  Respiratory:  Positive for shortness of breath. Negative for cough.   Gastrointestinal:  Negative for vomiting.  Neurological:  Positive for tremors and speech difficulty.  All other systems reviewed and are negative.  Physical Exam Updated Vital Signs BP 122/74 (BP Location: Left Arm)   Pulse 63   Temp 98.1 F (36.7 C)   Resp 16   SpO2 97%   Physical Exam HENT:     Head: Normocephalic.     Nose: Nose normal.     Mouth/Throat:     Pharynx: Oropharynx is clear.  Eyes:     Conjunctiva/sclera: Conjunctivae normal.  Cardiovascular:     Rate and Rhythm: Normal rate and regular rhythm.  Pulmonary:     Effort: Pulmonary effort is normal.     Breath sounds: Normal breath sounds.  Abdominal:     Palpations: Abdomen is soft.  Skin:    General: Skin is warm and dry.  Neurological:     Mental Status: He is alert. Mental status is at baseline.  Psychiatric:        Mood and Affect: Mood normal.    ED Results / Procedures / Treatments   Labs (all labs ordered are listed, but only abnormal results are displayed) Labs Reviewed  URINALYSIS, ROUTINE W REFLEX MICROSCOPIC - Abnormal; Notable for the following components:      Result Value   Color, Urine COLORLESS (*)    Leukocytes,Ua LARGE (*)    Bacteria, UA RARE (*)     All other components within normal limits  CBC WITH DIFFERENTIAL/PLATELET - Abnormal; Notable for the following components:   Neutro Abs 8.4 (*)    All other components within normal limits  URINE CULTURE  BASIC METABOLIC PANEL  LACTIC ACID, PLASMA    EKG None  Radiology DG Chest Port 1 View  Result Date: 07/16/2021 CLINICAL DATA:  Recurrent UTI, AMS, lethargy, spasms EXAM: PORTABLE CHEST 1 VIEW COMPARISON:  Portable exam 1356 hours compared to 02/23/2020 FINDINGS: Low lung volumes with elevations of  the diaphragms bilaterally. Normal heart size and mediastinal contours. Bibasilar atelectasis. No infiltrate, pleural effusion, or pneumothorax. Bones demineralized. Bowel interposition between liver and diaphragm. IMPRESSION: Low lung volumes with bibasilar atelectasis. Electronically Signed   By: Ulyses Southward M.D.   On: 07/16/2021 14:24    Procedures Procedures   Medications Ordered in ED Medications - No data to display  ED Course  I have reviewed the triage vital signs and the nursing notes.  Pertinent labs & imaging results that were available during my care of the patient were reviewed by me and considered in my medical decision making (see chart for details).  Patient discussed with Dr. Adela Lank.   MDM Rules/Calculators/A&P                           Pt diagnosed with a UTI. Pt is afebrile, withouttachycardia, hypotension, or other signs of serious infection.  Pt to be dc home with antibiotics and instructions to follow up with PCP if symptoms persist. Discussed return precautions. Pt appears safe for discharge.  Final Clinical Impression(s) / ED Diagnoses Final diagnoses:  Cystitis    Rx / DC Orders ED Discharge Orders          Ordered    amoxicillin (AMOXIL) 500 MG tablet  2 times daily        07/16/21 1548    ciprofloxacin (CIPRO) 500 MG tablet  2 times daily        07/16/21 1548             Felicie Morn, NP 07/16/21 2222    Ernie Avena, MD 07/16/21  2242

## 2021-07-17 LAB — URINE CULTURE

## 2021-07-24 ENCOUNTER — Ambulatory Visit (INDEPENDENT_AMBULATORY_CARE_PROVIDER_SITE_OTHER): Payer: Medicare Other | Admitting: Neurology

## 2021-07-24 ENCOUNTER — Telehealth: Payer: Self-pay | Admitting: Neurology

## 2021-07-24 ENCOUNTER — Encounter: Payer: Self-pay | Admitting: Neurology

## 2021-07-24 VITALS — BP 113/76 | HR 60 | Ht 66.0 in

## 2021-07-24 DIAGNOSIS — Z5181 Encounter for therapeutic drug level monitoring: Secondary | ICD-10-CM | POA: Diagnosis not present

## 2021-07-24 DIAGNOSIS — R269 Unspecified abnormalities of gait and mobility: Secondary | ICD-10-CM

## 2021-07-24 DIAGNOSIS — M542 Cervicalgia: Secondary | ICD-10-CM

## 2021-07-24 DIAGNOSIS — G35 Multiple sclerosis: Secondary | ICD-10-CM

## 2021-07-24 DIAGNOSIS — F482 Pseudobulbar affect: Secondary | ICD-10-CM

## 2021-07-24 MED ORDER — ALBUTEROL SULFATE HFA 108 (90 BASE) MCG/ACT IN AERS: 2.0000 | INHALATION_SPRAY | Freq: Four times a day (QID) | RESPIRATORY_TRACT | 3 refills | Status: DC | PRN

## 2021-07-24 MED ORDER — METHOCARBAMOL 500 MG PO TABS: 500.0000 mg | ORAL_TABLET | Freq: Three times a day (TID) | ORAL | 1 refills | Status: DC

## 2021-07-24 NOTE — Telephone Encounter (Signed)
Pt's wife, Coley Kulikowski request refill for albuterol (PROVENTIL HFA;VENTOLIN HFA) 108 (90 Base) MCG/ACT inhaler at [facility] [phone]

## 2021-07-24 NOTE — Telephone Encounter (Signed)
I advised pt in office that we don't manage this medication in office advised to contact PCP, were you going to fill this Rx anyway?

## 2021-07-24 NOTE — Telephone Encounter (Signed)
Pt's wife says that she confirmed with [facility](formally known as [facility]) they do provide Physical Therapy and that's where wife wants the order sent. Wife also states that re: pt's albuterol (PROVENTIL HFA;VENTOLIN HFA) 108 (90 Base) MCG/ACT inhaler Dr Anne Hahn asked wife if it helps him and she informed him yes, especially when pt has episodes. Wife states she was lead to believe that Dr Anne Hahn would call it in.  The provider that wrote initial script no longer practices.

## 2021-07-24 NOTE — Progress Notes (Signed)
Reason for visit: Multiple sclerosis, quadriparesis  [name] is an 60 y.o. male  History of present illness:  [name] is a 60 year old right-handed white male with a history of multiple sclerosis associated with secondary progressive features.  The patient has a quadriparesis.  He has fairly good function of the left arm, but his right arm is more impaired.  He has almost no function of both legs.  He has noted over the last 6 weeks that he has developed some neck discomfort with turning the head that has gradually worsened.  He denies any pain going down the arms on either side.  He is on Ocrevus, he tolerates the medication well.  He has an indwelling catheter, he has frequent urinary tract infections.  He was seen in the emergency room on 16 July 2021 for a bladder infection, he currently is on antibiotics.  The patient lives with his wife who is his primary caretaker.  He returns today for further evaluation.  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 He has never used smokeless tobacco. He reports that he does not drink alcohol and does not use drugs.   No Known Allergies  Medications:  Prior to Admission medications   Medication Sig Start Date End Date Taking? Authorizing  Provider  albuterol (PROVENTIL HFA;VENTOLIN HFA) 108 (90 Base) MCG/ACT inhaler Inhale 2 puffs into the lungs every 6 (six) hours as needed. Patient taking differently: Inhale 2 puffs into the lungs every 6 (six) hours as needed for wheezing or shortness of breath. 07/19/18  Yes Terressa Koyanagi, DO  amoxicillin (AMOXIL) 500 MG tablet Take 1 tablet (500 mg total) by mouth 2 (two) times daily. 07/16/21  Yes Felicie Morn, NP  Ascorbic Acid (VITAMIN C) 100 MG tablet Take 100 mg by mouth daily.   Yes [provider]  baclofen (LIORESAL) 10 MG tablet 1/2 tablet in the morning and at noon, and 2 tablets at night Patient taking differently: Take 5-10 mg by mouth See admin instructions. Take 5mg  in the morning and at noon, and 10mg  at night. 05/20/16  Yes , MD  cholecalciferol (VITAMIN D3) 25 MCG (1000 UNIT) tablet Take 1,000 Units by mouth daily.   Yes [provider]  ciprofloxacin (CIPRO) 500 MG tablet Take 1 tablet (500 mg total) by mouth 2 (two) times daily. 07/16/21  Yes York Spaniel, NP  D-MANNOSE PO Take 1,300 mg by mouth See admin instructions. 1300 mg with cranberry extract for uti   Yes [provider]  Dextromethorphan-Quinidine  20-10 MG CAPS Take 1 tablet by mouth 2 (two) times daily. 05/27/17  Yes York Spaniel, MD  escitalopram (LEXAPRO) 10 MG tablet Take 10 mg by mouth daily.   Yes [provider]  gabapentin (NEURONTIN) 100 MG capsule Take 1 capsule (100 mg total) by mouth at bedtime. 01/20/21  Yes York Spaniel, MD  methenamine (HIPREX) 1 g tablet Take 1 g by mouth 2 (two) times daily with a meal.   Yes [provider]  modafinil (PROVIGIL) 100 MG tablet TAKE 1 TABLET (100 MG TOTAL) BY MOUTH IN THE MORNING. *SENT FOR PA* 11/29/20  Yes York Spaniel, MD  ocrelizumab 600 mg in sodium chloride 0.9 % 500 mL Inject 600 mg into the vein every 6 (six) months.    Yes [provider]    ROS:  Out of a complete 14 system  review of symptoms, the patient complains only of the following symptoms, and all other reviewed systems are negative.  Weakness Neck pain Frequent bladder infections  Blood pressure 113/76, pulse 60, height 5\' 6"  (1.676 m).  Physical Exam  General: The patient is alert and cooperative at the time of the examination.  Skin: 3+ edema noted below the knees bilaterally.   Neurologic Exam  Mental status: The patient is alert and oriented x 3 at the time of the examination. The patient has apparent normal recent and remote memory, with an apparently normal attention span and concentration ability.   Cranial nerves: Facial symmetry is present. Speech is normal, no aphasia or dysarthria is noted. Extraocular movements are full. Visual fields are full.  The patient has mild bilateral INO.  Motor: The patient has good strength in the left upper extremity.  With the right upper extremity, the patient has 4/5 grip strength, 4-/5 biceps and triceps strength, and 2-3/5 deltoid strength.  The patient has 3/5 strength with leg extension bilaterally, unable to flex at the hips on either side.  Sensory examination: Soft touch sensation is decreased on the left hand as compared to the right, symmetric on the face.  Coordination: The patient has good finger-nose-finger with the left upper extremity, cannot perform with the right.  Unable to perform heel-to-shin on either side.  Gait and station: The patient cannot be ambulated, he is wheelchair-bound.  Reflexes: Deep tendon reflexes are symmetric.   Assessment/Plan:  1.  Multiple sclerosis  2.  Quadriparesis  3.  Frequent bladder infections  4.  Reported neck discomfort  The patient will be set up for home health physical therapy for the neck, he will be given a short course of methocarbamol.  The patient will continue the Ocrevus, blood work will be done today.  He will follow-up in 6 months, in the future he can be followed through Dr.  .  Epimenio Foot MD 07/24/2021 11:31 AM  [facility] [address] [place], [place]  Phone [phone] Fax [phone]

## 2021-07-25 LAB — COMPREHENSIVE METABOLIC PANEL
ALT: 18 IU/L (ref 0–44)
AST: 16 IU/L (ref 0–40)
Albumin/Globulin Ratio: 2.3 — ABNORMAL HIGH (ref 1.2–2.2)
Albumin: 4.6 g/dL (ref 3.8–4.9)
Alkaline Phosphatase: 137 IU/L — ABNORMAL HIGH (ref 44–121)
BUN/Creatinine Ratio: 18 (ref 9–20)
BUN: 17 mg/dL (ref 6–24)
Bilirubin Total: 0.4 mg/dL (ref 0.0–1.2)
CO2: 27 mmol/L (ref 20–29)
Calcium: 9.3 mg/dL (ref 8.7–10.2)
Chloride: 102 mmol/L (ref 96–106)
Creatinine, Ser: 0.96 mg/dL (ref 0.76–1.27)
Globulin, Total: 2 g/dL (ref 1.5–4.5)
Glucose: 64 mg/dL — ABNORMAL LOW (ref 70–99)
Potassium: 4.6 mmol/L (ref 3.5–5.2)
Sodium: 141 mmol/L (ref 134–144)
Total Protein: 6.6 g/dL (ref 6.0–8.5)
eGFR: 92 mL/min/{1.73_m2} (ref >59)

## 2021-07-25 LAB — IGG, IGA, IGM
IgA/Immunoglobulin A, Serum: 159 mg/dL (ref 90–386)
IgG (Immunoglobin G), Serum: 666 mg/dL (ref 603–1613)
IgM (Immunoglobulin M), Srm: 43 mg/dL (ref 20–172)

## 2022-01-05 ENCOUNTER — Other Ambulatory Visit: Payer: Self-pay

## 2022-01-05 MED ORDER — METHOCARBAMOL 500 MG PO TABS: 500.0000 mg | ORAL_TABLET | Freq: Three times a day (TID) | ORAL | 0 refills | Status: DC

## 2022-01-21 ENCOUNTER — Other Ambulatory Visit: Payer: Self-pay | Admitting: Physical Medicine and Rehabilitation

## 2022-01-21 ENCOUNTER — Other Ambulatory Visit (HOSPITAL_COMMUNITY): Payer: Self-pay | Admitting: Physical Medicine and Rehabilitation

## 2022-01-22 ENCOUNTER — Other Ambulatory Visit (HOSPITAL_COMMUNITY): Payer: Self-pay | Admitting: Internal Medicine

## 2022-01-22 ENCOUNTER — Other Ambulatory Visit: Payer: Self-pay | Admitting: Internal Medicine

## 2022-01-22 ENCOUNTER — Other Ambulatory Visit: Payer: Self-pay | Admitting: Physical Medicine and Rehabilitation

## 2022-01-22 ENCOUNTER — Other Ambulatory Visit (HOSPITAL_COMMUNITY): Payer: Self-pay | Admitting: Physical Medicine and Rehabilitation

## 2022-01-22 DIAGNOSIS — Z0189 Encounter for other specified special examinations: Secondary | ICD-10-CM

## 2022-01-23 ENCOUNTER — Other Ambulatory Visit (HOSPITAL_COMMUNITY): Payer: Self-pay | Admitting: Specialist

## 2022-01-23 ENCOUNTER — Other Ambulatory Visit (HOSPITAL_COMMUNITY): Payer: Self-pay | Admitting: Physical Medicine and Rehabilitation

## 2022-01-23 DIAGNOSIS — K769 Liver disease, unspecified: Secondary | ICD-10-CM

## 2022-01-26 ENCOUNTER — Ambulatory Visit (INDEPENDENT_AMBULATORY_CARE_PROVIDER_SITE_OTHER): Payer: Medicare Other | Admitting: Neurology

## 2022-01-26 ENCOUNTER — Encounter: Payer: Self-pay | Admitting: Neurology

## 2022-01-26 VITALS — BP 114/71 | HR 59 | Ht 66.0 in

## 2022-01-26 DIAGNOSIS — E559 Vitamin D deficiency, unspecified: Secondary | ICD-10-CM

## 2022-01-26 DIAGNOSIS — G35 Multiple sclerosis: Secondary | ICD-10-CM | POA: Diagnosis not present

## 2022-01-26 DIAGNOSIS — Z79899 Other long term (current) drug therapy: Secondary | ICD-10-CM

## 2022-01-26 NOTE — Progress Notes (Signed)
? ?[facility] ? ?PATIENT: [name] ?DOB: 1961/04/24 ? ?REFERRING DOCTOR OR PCP: [name], MD ?SOURCE: Patient, notes from Dr. Anne Hahn, imaging and lab reports, MRI images personally reviewed. ? ?_________________________________ ? ? ?HISTORICAL ? ?CHIEF COMPLAINT:  ?Chief Complaint  ?Patient presents with  ? Follow-up  ?  Rm 2, pt with wife. Stable.  Pt still on ocrevus. Last infused dec 2022. Last ov was october  ? ? ?HISTORY OF PRESENT ILLNESS:  ?[name] is a 61 y.o. man with a relapsing form of secondary progressive MS. ? ?Initial visit with me 01/26/2022:  ?He is on Ocrevus as his disease modifying therapy and tolerates it well.  He has not had any exacerbations since starting it around 2018 or 2019 but has had continued progression ? ?He is wheelchair bound.    He last used a cane in 2009 and then [place] crutches.  After a bad fall that year, he started to use a wheelchair.  He was able to transfer independently more years and then transfer with assistance.   He has needed a lift to transfer since a long hospital stay for a pseudomonas UTI (urosepsis) May 2021.     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    He is on baclofen 10 mg 2-3 times a day.  Tizanidine caused hallucinations.  He has numbness waist down.  He takes gabapentin with benefit for dysesthesias.   He has a Foley catheter changed every month.  He is colonized and is on methenamine and Vit C.   If getting weaker or cognitive changes he takes Cipro or Amoxicillin and gets a urine culture done for sensitivity.      ? ?He has episodes of altered cognition  immediately after many meals.   He is scheduled for an abdominal MRI ? ?He has a strong FH of MS with two sisters, one niece.   He [place] many people with [place].    ? ?MS History: ?He was diagnosed in 2007 after presenting with gradual worsening of balance.  Around that time, he had hurt his knee but even when knee  improved, he flet off balance and would hold the wall or furniture at time.   In retrospect, he had some symptoms in 2001.  At that me an MRI was reportedly normal.  That year, he had fairly sudden onset of numbness below his waist that gradually improved over the next few weeks.    ? ?He was in a trial comparing Rebif and Betaseron n 2007 and was initially on Rebif and then changed to Betaseron.   He had injection site reactions.   He started taking GIlenya in 2011.    He switched to Ocrevus in 2019.   ? ? ?IMAGING: ?MRI of the head 07/11/2019 shows T2 hyperintense foci within the left posterior medulla, cerebellar hemispheres, lower left central pons, right midbrain, left thalamus, and in the periventricular, juxtacortical and deep white matter of both hemispheres.  No definite changes compared to the MRI from 08/01/2014. ? ?MRI of the brain 08/01/2014 shows T2 hyperintense foci within the left posterior medulla, cerebellar hemispheres, lower left central pons, right midbrain, left thalamus, and in the periventricular, juxtacortical and deep white matter of both hemispheres.  No definite changes compared to the MRI from 08/01/2014.  The infratentorial lesions were not clearly present on the previous MRI from 2007 and there has also been some progression in the hemispheres.. ? ?  MRI of the brain 01/11/2006 shows T2/FLAIR hyperintense foci in the hemispheres consistent with MS ? ?MRI cervical spine 07/11/2019 shows foci seen on the previous MRI as well as a focus at T2-T3. ? ?MRI of the cervical spine 01/10/2006 shows T2 hyperintense foci within the spinal cord Adjacent to C5, centrally towards the left adjacent to T1 and at T2-T3 ? ?REVIEW OF SYSTEMS: ?Constitutional: No fevers, chills, sweats, or change in appetite ?Eyes: No visual changes, double vision, eye pain ?Ear, nose and throat: No hearing loss, ear pain, nasal congestion, sore throat ?Cardiovascular: No chest pain, palpitations ?Respiratory:  No shortness of  breath at rest or with exertion.   No wheezes ?GastrointestinaI: No nausea, vomiting, diarrhea, abdominal pain, fecal incontinence ?Genitourinary:  No dysuria, urinary retention or frequency.  No nocturia. ?Musculoskeletal:  No neck pain, back pain ?Integumentary: No rash, pruritus, skin lesions ?Neurological: as above ?Psychiatric: No depression at this time.  No anxiety ?Endocrine: No palpitations, diaphoresis, change in appetite, change in weigh or increased thirst ?Hematologic/Lymphatic:  No anemia, purpura, petechiae. ?Allergic/Immunologic: No itchy/runny eyes, nasal congestion, recent allergic reactions, rashes ? ?ALLERGIES: ?No Known Allergies ? ?HOME MEDICATIONS: ? ?Current Outpatient Medications:  ?  albuterol (VENTOLIN HFA) 108 (90 Base) MCG/ACT inhaler, Inhale 2 puffs into the lungs every 6 (six) hours as needed., Disp: 1 each, Rfl: 3 ?  amoxicillin (AMOXIL) 500 MG tablet, Take 1 tablet (500 mg total) by mouth 2 (two) times daily., Disp: 28 tablet, Rfl: 0 ?  Ascorbic Acid (VITAMIN C) 100 MG tablet, Take 100 mg by mouth daily., Disp: , Rfl:  ?  baclofen (LIORESAL) 10 MG tablet, 1/2 tablet in the morning and at noon, and 2 tablets at night (Patient taking differently: Take 5-10 mg by mouth See admin instructions. Take 5mg  in the morning and at noon, and 10mg  at night.), Disp: 270 each, Rfl: 3 ?  cholecalciferol (VITAMIN D3) 25 MCG (1000 UNIT) tablet, Take 1,000 Units by mouth daily., Disp: , Rfl:  ?  ciprofloxacin (CIPRO) 500 MG tablet, Take 1 tablet (500 mg total) by mouth 2 (two) times daily., Disp: 28 tablet, Rfl: 0 ?  D-MANNOSE PO, Take 1,300 mg by mouth See admin instructions. 1300 mg with cranberry extract for uti, Disp: , Rfl:  ?  Dextromethorphan-Quinidine 20-10 MG CAPS, Take 1 tablet by mouth 2 (two) times daily., Disp: 60 capsule, Rfl: 3 ?  escitalopram (LEXAPRO) 10 MG tablet, Take 10 mg by mouth daily., Disp: , Rfl:  ?  gabapentin (NEURONTIN) 100 MG capsule, Take 1 capsule (100 mg total) by mouth  at bedtime., Disp: 90 capsule, Rfl: 3 ?  methenamine (HIPREX) 1 g tablet, Take 1 g by mouth 2 (two) times daily with a meal., Disp: , Rfl:  ?  methocarbamol (ROBAXIN) 500 MG tablet, Take 1 tablet (500 mg total) by mouth 3 (three) times daily., Disp: 30 tablet, Rfl: 0 ?  modafinil (PROVIGIL) 100 MG tablet, TAKE 1 TABLET (100 MG TOTAL) BY MOUTH IN THE MORNING. *SENT FOR PA*, Disp: 90 tablet, Rfl: 1 ?  ocrelizumab 600 mg in sodium chloride 0.9 % 500 mL, Inject 600 mg into the vein every 6 (six) months. , Disp: , Rfl:  ? ?PAST MEDICAL HISTORY: ?Past Medical History:  ?Diagnosis Date  ? Abnormality of gait 11/21/2015  ? Asthma   ? childhood asthma  ? Classic migraine   ? Depression   ? Dysphagia   ? Hay fever   ? Headache syndrome 12/22/2018  ? MS (multiple  sclerosis) (HCC)   ? 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 ? ?PAST SURGICAL HISTORY: ?Past Surgical History:  ?Procedure Laterality Date  ? eye surgeries    ? x 2; bilateral 74 and 76  ? ? ?FAMILY HISTORY: ?Family History  ?Problem Relation Age of Onset  ? Cancer Father   ? Multiple sclerosis Sister   ? Seizures Maternal Uncle   ? Parkinsonism Maternal Uncle   ? Multiple sclerosis Sister   ? Multiple sclerosis Paternal Uncle   ? Multiple sclerosis Other   ? Lung cancer Other   ?     parent  ? Uterine cancer Other   ?     other  ? ? ?SOCIAL HISTORY: ? ?Social History  ? ?Socioeconomic History  ? Marital status: Married  ?  Spouse name: joy  ? Number of children: 2  ? Years of education: college  ? Highest education level: Not on file  ?Occupational History  ? Occupation: disabled  ?Tobacco Use  ? Smoking status: Former  ? Smokeless tobacco: Never  ?Vaping Use  ? Vaping Use: Not on file  ?Substance and Sexual Activity  ? Alcohol use: No  ?  Comment: rare   ? Drug use: No  ? Sexual activity: Not on file  ?Other Topics Concern  ? Not on file  ?Social History Narrative  ? Lives at home, married  ? Patient is right handed.  ? Patient drinks 1 cup caffeine daily.  ? ?Social  Determinants of Health  ? ?Financial Resource Strain: Not on file  ?Food Insecurity: Not on file  ?Transportation Needs: Not on file  ?Physical Activity: Not on file  ?Stress: Not on file  ?Social Connections: N

## 2022-01-27 LAB — COMPREHENSIVE METABOLIC PANEL
ALT: 15 IU/L (ref 0–44)
AST: 11 IU/L (ref 0–40)
Albumin/Globulin Ratio: 2.3 — ABNORMAL HIGH (ref 1.2–2.2)
Albumin: 4.6 g/dL (ref 3.8–4.9)
Alkaline Phosphatase: 140 IU/L — ABNORMAL HIGH (ref 44–121)
BUN/Creatinine Ratio: 14 (ref 9–20)
BUN: 13 mg/dL (ref 6–24)
Bilirubin Total: 0.5 mg/dL (ref 0.0–1.2)
CO2: 25 mmol/L (ref 20–29)
Calcium: 9.5 mg/dL (ref 8.7–10.2)
Chloride: 102 mmol/L (ref 96–106)
Creatinine, Ser: 0.94 mg/dL (ref 0.76–1.27)
Globulin, Total: 2 g/dL (ref 1.5–4.5)
Glucose: 79 mg/dL (ref 70–99)
Potassium: 4.3 mmol/L (ref 3.5–5.2)
Sodium: 141 mmol/L (ref 134–144)
Total Protein: 6.6 g/dL (ref 6.0–8.5)
eGFR: 94 mL/min/{1.73_m2} (ref >59)

## 2022-01-27 LAB — CBC WITH DIFFERENTIAL/PLATELET
Basophils Absolute: 0 10*3/uL (ref 0.0–0.2)
Basos: 1 %
EOS (ABSOLUTE): 0.2 10*3/uL (ref 0.0–0.4)
Eos: 3 %
Hematocrit: 44.8 % (ref 37.5–51.0)
Hemoglobin: 14.9 g/dL (ref 13.0–17.7)
Immature Grans (Abs): 0 10*3/uL (ref 0.0–0.1)
Immature Granulocytes: 0 %
Lymphocytes Absolute: 0.8 10*3/uL (ref 0.7–3.1)
Lymphs: 13 %
MCH: 29.9 pg (ref 26.6–33.0)
MCHC: 33.3 g/dL (ref 31.5–35.7)
MCV: 90 fL (ref 79–97)
Monocytes Absolute: 0.7 10*3/uL (ref 0.1–0.9)
Monocytes: 10 %
Neutrophils Absolute: 4.8 10*3/uL (ref 1.4–7.0)
Neutrophils: 73 %
Platelets: 280 10*3/uL (ref 150–450)
RBC: 4.99 x10E6/uL (ref 4.14–5.80)
RDW: 12.7 % (ref 11.6–15.4)
WBC: 6.5 10*3/uL (ref 3.4–10.8)

## 2022-01-27 LAB — IGG, IGA, IGM
IgA/Immunoglobulin A, Serum: 145 mg/dL (ref 90–386)
IgG (Immunoglobin G), Serum: 685 mg/dL (ref 603–1613)
IgM (Immunoglobulin M), Srm: 49 mg/dL (ref 20–172)

## 2022-01-27 LAB — VITAMIN D 25 HYDROXY (VIT D DEFICIENCY, FRACTURES): Vit D, 25-Hydroxy: 28.1 ng/mL — ABNORMAL LOW (ref 30.0–100.0)

## 2022-02-06 ENCOUNTER — Ambulatory Visit (HOSPITAL_COMMUNITY)
Admission: RE | Admit: 2022-02-06 | Discharge: 2022-02-06 | Disposition: A | Discharge status: Home / Self Care | Payer: No Typology Code available for payment source | Source: Ambulatory Visit | Attending: Physical Medicine and Rehabilitation | Admitting: Physical Medicine and Rehabilitation

## 2022-02-06 DIAGNOSIS — K769 Liver disease, unspecified: Secondary | ICD-10-CM | POA: Insufficient documentation

## 2022-03-09 ENCOUNTER — Encounter (HOSPITAL_COMMUNITY): Payer: Self-pay | Admitting: Emergency Medicine

## 2022-03-09 ENCOUNTER — Inpatient Hospital Stay (HOSPITAL_COMMUNITY)
Admission: EM | Admit: 2022-03-09 | Discharge: 2022-03-12 | DRG: 698 | Disposition: A | Discharge status: Home / Self Care | Payer: No Typology Code available for payment source | Attending: Internal Medicine | Admitting: Internal Medicine

## 2022-03-09 DIAGNOSIS — Y846 Urinary catheterization as the cause of abnormal reaction of the patient, or of later complication, without mention of misadventure at the time of the procedure: Secondary | ICD-10-CM | POA: Diagnosis present

## 2022-03-09 DIAGNOSIS — R269 Unspecified abnormalities of gait and mobility: Secondary | ICD-10-CM

## 2022-03-09 DIAGNOSIS — R0602 Shortness of breath: Secondary | ICD-10-CM | POA: Diagnosis present

## 2022-03-09 DIAGNOSIS — Z66 Do not resuscitate: Secondary | ICD-10-CM | POA: Diagnosis present

## 2022-03-09 DIAGNOSIS — A419 Sepsis, unspecified organism: Secondary | ICD-10-CM | POA: Diagnosis present

## 2022-03-09 DIAGNOSIS — N319 Neuromuscular dysfunction of bladder, unspecified: Secondary | ICD-10-CM | POA: Diagnosis present

## 2022-03-09 DIAGNOSIS — B961 Klebsiella pneumoniae [K. pneumoniae] as the cause of diseases classified elsewhere: Secondary | ICD-10-CM | POA: Diagnosis present

## 2022-03-09 DIAGNOSIS — N390 Urinary tract infection, site not specified: Secondary | ICD-10-CM | POA: Diagnosis present

## 2022-03-09 DIAGNOSIS — E875 Hyperkalemia: Secondary | ICD-10-CM

## 2022-03-09 DIAGNOSIS — M542 Cervicalgia: Secondary | ICD-10-CM | POA: Diagnosis present

## 2022-03-09 DIAGNOSIS — G9340 Encephalopathy, unspecified: Secondary | ICD-10-CM | POA: Diagnosis not present

## 2022-03-09 DIAGNOSIS — G8250 Quadriplegia, unspecified: Secondary | ICD-10-CM | POA: Diagnosis present

## 2022-03-09 DIAGNOSIS — G35 Multiple sclerosis: Secondary | ICD-10-CM | POA: Diagnosis present

## 2022-03-09 DIAGNOSIS — G8929 Other chronic pain: Secondary | ICD-10-CM | POA: Diagnosis present

## 2022-03-09 DIAGNOSIS — M4802 Spinal stenosis, cervical region: Secondary | ICD-10-CM

## 2022-03-09 DIAGNOSIS — Z79899 Other long term (current) drug therapy: Secondary | ICD-10-CM | POA: Diagnosis not present

## 2022-03-09 DIAGNOSIS — T83511A Infection and inflammatory reaction due to indwelling urethral catheter, initial encounter: Secondary | ICD-10-CM | POA: Diagnosis present

## 2022-03-09 DIAGNOSIS — Z87891 Personal history of nicotine dependence: Secondary | ICD-10-CM | POA: Diagnosis not present

## 2022-03-09 DIAGNOSIS — G5712 Meralgia paresthetica, left lower limb: Secondary | ICD-10-CM

## 2022-03-09 DIAGNOSIS — R6520 Severe sepsis without septic shock: Secondary | ICD-10-CM | POA: Diagnosis present

## 2022-03-09 DIAGNOSIS — M549 Dorsalgia, unspecified: Secondary | ICD-10-CM | POA: Diagnosis present

## 2022-03-09 LAB — COMPREHENSIVE METABOLIC PANEL
ALT: 19 U/L (ref 0–44)
AST: 17 U/L (ref 15–41)
Albumin: 4.2 g/dL (ref 3.5–5.0)
Alkaline Phosphatase: 120 U/L (ref 38–126)
Anion gap: 8 (ref 5–15)
BUN: 16 mg/dL (ref 6–20)
CO2: 27 mmol/L (ref 22–32)
Calcium: 9 mg/dL (ref 8.9–10.3)
Chloride: 105 mmol/L (ref 98–111)
Creatinine, Ser: 1.03 mg/dL (ref 0.61–1.24)
GFR, Estimated: >60 mL/min (ref >60)
Glucose, Bld: 111 mg/dL — ABNORMAL HIGH (ref 70–99)
Potassium: 4.3 mmol/L (ref 3.5–5.1)
Sodium: 140 mmol/L (ref 135–145)
Total Bilirubin: 0.5 mg/dL (ref 0.3–1.2)
Total Protein: 6.5 g/dL (ref 6.5–8.1)

## 2022-03-09 LAB — URINALYSIS, ROUTINE W REFLEX MICROSCOPIC
Bilirubin Urine: NEGATIVE
Glucose, UA: NEGATIVE mg/dL
Ketones, ur: 20 mg/dL — AB
Nitrite: NEGATIVE
Protein, ur: 100 mg/dL — AB
RBC / HPF: >50 RBC/hpf — ABNORMAL HIGH (ref 0–5)
Specific Gravity, Urine: 1.017 (ref 1.005–1.030)
WBC, UA: >50 WBC/hpf — ABNORMAL HIGH (ref 0–5)
pH: 7 (ref 5.0–8.0)

## 2022-03-09 LAB — CBC WITH DIFFERENTIAL/PLATELET
Abs Immature Granulocytes: 0.06 10*3/uL (ref 0.00–0.07)
Basophils Absolute: 0.1 10*3/uL (ref 0.0–0.1)
Basophils Relative: 0 %
Eosinophils Absolute: 0.2 10*3/uL (ref 0.0–0.5)
Eosinophils Relative: 1 %
HCT: 46.2 % (ref 39.0–52.0)
Hemoglobin: 15.1 g/dL (ref 13.0–17.0)
Immature Granulocytes: 0 %
Lymphocytes Relative: 2 %
Lymphs Abs: 0.4 10*3/uL — ABNORMAL LOW (ref 0.7–4.0)
MCH: 30.5 pg (ref 26.0–34.0)
MCHC: 32.7 g/dL (ref 30.0–36.0)
MCV: 93.3 fL (ref 80.0–100.0)
Monocytes Absolute: 1 10*3/uL (ref 0.1–1.0)
Monocytes Relative: 5 %
Neutro Abs: 17.7 10*3/uL — ABNORMAL HIGH (ref 1.7–7.7)
Neutrophils Relative %: 92 %
Platelets: 244 10*3/uL (ref 150–400)
RBC: 4.95 MIL/uL (ref 4.22–5.81)
RDW: 13.5 % (ref 11.5–15.5)
WBC: 19.3 10*3/uL — ABNORMAL HIGH (ref 4.0–10.5)
nRBC: 0 % (ref 0.0–0.2)

## 2022-03-09 LAB — LACTIC ACID, PLASMA: Lactic Acid, Venous: 1.1 mmol/L (ref 0.5–1.9)

## 2022-03-09 MED ORDER — FENTANYL CITRATE PF 50 MCG/ML IJ SOSY
50.0000 ug | PREFILLED_SYRINGE | Freq: Once | INTRAMUSCULAR | Status: AC
  Administered 2022-03-09: 50 ug via INTRAVENOUS
  Filled 2022-03-09: qty 1

## 2022-03-09 MED ORDER — ACETAMINOPHEN 325 MG PO TABS
650.0000 mg | ORAL_TABLET | Freq: Once | ORAL | Status: AC
  Administered 2022-03-09: 650 mg via ORAL
  Filled 2022-03-09: qty 2

## 2022-03-09 MED ORDER — LACTATED RINGERS IV BOLUS
500.0000 mL | Freq: Once | INTRAVENOUS | Status: AC
  Administered 2022-03-09: 500 mL via INTRAVENOUS

## 2022-03-09 MED ORDER — SODIUM CHLORIDE 0.9 % IV SOLN
2.0000 g | Freq: Once | INTRAVENOUS | Status: AC
  Administered 2022-03-09: 2 g via INTRAVENOUS
  Filled 2022-03-09: qty 20

## 2022-03-09 MED ORDER — VANCOMYCIN HCL 2000 MG/400ML IV SOLN
2000.0000 mg | Freq: Once | INTRAVENOUS | Status: AC
  Administered 2022-03-09: 2000 mg via INTRAVENOUS
  Filled 2022-03-09: qty 400

## 2022-03-09 MED ORDER — SODIUM CHLORIDE 0.9 % IV SOLN
2.0000 g | Freq: Three times a day (TID) | INTRAVENOUS | Status: DC
  Administered 2022-03-09 – 2022-03-11 (×5): 2 g via INTRAVENOUS
  Filled 2022-03-09 (×5): qty 12.5

## 2022-03-09 MED ORDER — LACTATED RINGERS IV BOLUS
1000.0000 mL | Freq: Once | INTRAVENOUS | Status: AC
  Administered 2022-03-09: 1000 mL via INTRAVENOUS

## 2022-03-09 MED ORDER — VANCOMYCIN HCL 1500 MG/300ML IV SOLN: 1500.0000 mg | INTRAVENOUS | Status: DC

## 2022-03-09 MED ORDER — OXYCODONE HCL 5 MG PO TABS
5.0000 mg | ORAL_TABLET | Freq: Once | ORAL | Status: AC
  Administered 2022-03-09: 5 mg via ORAL
  Filled 2022-03-09: qty 1

## 2022-03-09 NOTE — Progress Notes (Signed)
Pharmacy Antibiotic Note  [name] is a 61 y.o. male for which pharmacy has been consulted for cefepime and vancomycin dosing for UTI.  Patient with a history of MS, neurogenic bladder with Foley, recurrent UTIs . Patient presenting with UTI.  SCr 1.03 - at baseline WBC 19.3; LA 1.1; T 100.4 F  Plan: Cefepime 2g q8hr Vancomycin 2000 mg once then 1500 mg q24hr (eAUC 477.5) unless change in renal function Trend WBC, Fever, Renal function, & Clinical course F/u cultures, clinical course, WBC, fever De-escalate when able  Height: 5\' 6"  (167.6 cm) Weight: 99.8 kg (220 lb) IBW/kg (Calculated) : 63.8  Temp (24hrs), Avg:99.8 F (37.7 C), Min:99.2 F (37.3 C), Max:100.4 F (38 C)  Recent Labs  Lab 03/09/22 1935  WBC 19.3*  CREATININE 1.03  LATICACIDVEN 1.1    Estimated Creatinine Clearance: 86.5 mL/min (by C-G formula based on SCr of 1.03 mg/dL).    No Known Allergies  Antimicrobials this admission: ceftriaxone 5/29 x 1 Cefepime 5/29 >>  vancomycin 5/29 >>   Microbiology results: Pending  Thank you for allowing pharmacy to be a part of this patient's care.  6/29, PharmD, BCPS 03/09/2022 9:24 PM ED Clinical Pharmacist -  [phone]

## 2022-03-09 NOTE — ED Notes (Signed)
Per MD Malen Gauze, change out foley per wife's request and to obtain clean urine sample.

## 2022-03-09 NOTE — ED Provider Notes (Signed)
[facility] EMERGENCY DEPARTMENT Provider Note   CSN: [id number] Arrival date & time: 03/09/22  1904     History  Chief Complaint  Patient presents with   Recurrent UTI    [name] is a 61 y.o. male with a history of MS, neurogenic bladder with Foley placement, recurrent UTIs, and asthma presenting to the ED with concerns for UTI.  Patient's wife provides much of the history.  She states that they had noted some sediment in the patient's catheter over the past several days.  She had contacted their infectious disease doctor on Friday to possibly start p.o. antibiotics, but she did not receive an answer.  Today, the patient suddenly started feeling unwell endorsing myalgias mainly over his neck and shoulders and feeling very fatigued.  He was also having some trouble speaking.  She states that the symptoms are consistent with prior UTI/urosepsis.  No fevers at home.  He does endorse some nausea but denies any abdominal pain, cough, vomiting.  HPI     Home Medications Prior to Admission medications   Medication Sig Start Date End Date Taking? Authorizing Provider  albuterol (VENTOLIN HFA) 108 (90 Base) MCG/ACT inhaler Inhale 2 puffs into the lungs every 6 (six) hours as needed. Patient taking differently: Inhale 1-2 puffs into the lungs every 6 (six) hours as needed for wheezing. 07/24/21  Yes York Spaniel, MD  Ascorbic Acid (VITAMIN C) 100 MG tablet Take 100 mg by mouth daily.   Yes [provider]  baclofen (LIORESAL) 10 MG tablet 1/2 tablet in the morning and at noon, and 2 tablets at night Patient taking differently: Take 20 mg by mouth daily as needed for muscle spasms. 05/20/16  Yes York Spaniel, MD  Carboxymethylcellulose Sod PF (THERATEARS) 1 % GEL Place 1 drop into both eyes daily as needed (dry eyes).   Yes [provider]  Carboxymethylcellulose Sodium (THERATEARS OP) Place 1 drop into both eyes daily as needed (dry eyes).    Yes [provider]  cholecalciferol (VITAMIN D3) 25 MCG (1000 UNIT) tablet Take 1,000 Units by mouth daily.   Yes [provider]  D-MANNOSE PO Take 1,300 mg by mouth See admin instructions. 1300 mg with cranberry extract for uti   Yes [provider]  Dextromethorphan-Quinidine 20-10 MG CAPS Take 1 tablet by mouth 2 (two) times daily. Patient taking differently: Take 1 capsule by mouth daily. 05/27/17  Yes York Spaniel, MD  escitalopram (LEXAPRO) 10 MG tablet Take 10 mg by mouth daily.   Yes [provider]  gabapentin (NEURONTIN) 100 MG capsule Take 1 capsule (100 mg total) by mouth at bedtime. Patient taking differently: Take 100 mg by mouth daily as needed (pain). 01/20/21  Yes York Spaniel, MD  ibuprofen (ADVIL) 200 MG tablet Take 600 mg by mouth every 6 (six) hours as needed for headache or moderate pain.   Yes [provider]  methenamine (HIPREX) 1 g tablet Take 1 g by mouth 2 (two) times daily with a meal.   Yes [provider]  methocarbamol (ROBAXIN) 500 MG tablet Take 1 tablet (500 mg total) by mouth 3 (three) times daily. Patient taking differently: Take 500 mg by mouth daily as needed for muscle spasms. 01/05/22  Yes Sater, Pearletha Furl, MD  modafinil (PROVIGIL) 100 MG tablet TAKE 1 TABLET (100 MG TOTAL) BY MOUTH IN THE MORNING. *SENT FOR PA* Patient taking differently: Take 100 mg by mouth daily. 11/29/20  Yes  York Spaniel, MD  amoxicillin (AMOXIL) 500 MG tablet Take 1 tablet (500 mg total) by mouth 2 (two) times daily. Patient not taking: Reported on 03/09/2022 07/16/21   Felicie Morn, NP  ciprofloxacin (CIPRO) 500 MG tablet Take 1 tablet (500 mg total) by mouth 2 (two) times daily. Patient not taking: Reported on 03/09/2022 07/16/21   Felicie Morn, NP  ocrelizumab 600 mg in sodium chloride 0.9 % 500 mL Inject 600 mg into the vein every 6 (six) months.     [provider]      Allergies    Patient has no known  allergies.    Review of Systems   Review of Systems  Constitutional:  Negative for fever.  Respiratory:  Positive for shortness of breath. Negative for cough.   Cardiovascular:  Negative for chest pain.  Gastrointestinal:  Positive for nausea. Negative for abdominal pain and vomiting.  Genitourinary:  Negative for decreased urine volume and difficulty urinating.       Positive for sediment in urine.  Musculoskeletal:  Positive for myalgias.  Neurological:  Positive for speech difficulty.   Physical Exam Updated Vital Signs BP 135/84   Pulse 95   Temp (!) 100.8 F (38.2 C)   Resp (!) 36   Ht 5\' 6"  (1.676 m)   Wt 99.8 kg   SpO2 95%   BMI 35.51 kg/m  Physical Exam Constitutional:      Appearance: He is ill-appearing and diaphoretic.  HENT:     Head: Normocephalic and atraumatic.     Right Ear: External ear normal.     Left Ear: External ear normal.     Nose: Nose normal.     Mouth/Throat:     Mouth: Mucous membranes are moist.     Pharynx: Oropharynx is clear.  Cardiovascular:     Rate and Rhythm: Normal rate and regular rhythm.     Pulses: Normal pulses.     Heart sounds: Normal heart sounds. No murmur heard.   No friction rub. No gallop.  Pulmonary:     Effort: Pulmonary effort is normal. No respiratory distress.     Breath sounds: Normal breath sounds. No wheezing, rhonchi or rales.  Abdominal:     Tenderness: There is no abdominal tenderness. There is no guarding or rebound.  Musculoskeletal:        General: No deformity.     Cervical back: Neck supple.     Right lower leg: No edema.     Left lower leg: Edema present.  Skin:    General: Skin is warm.  Neurological:     Mental Status: He is alert. Mental status is at baseline.    ED Results / Procedures / Treatments   Labs (all labs ordered are listed, but only abnormal results are displayed) Labs Reviewed  CBC WITH DIFFERENTIAL/PLATELET - Abnormal; Notable for the following components:      Result Value    WBC 19.3 (*)    Neutro Abs 17.7 (*)    Lymphs Abs 0.4 (*)    All other components within normal limits  COMPREHENSIVE METABOLIC PANEL - Abnormal; Notable for the following components:   Glucose, Bld 111 (*)    All other components within normal limits  URINALYSIS, ROUTINE W REFLEX MICROSCOPIC - Abnormal; Notable for the following components:   APPearance CLOUDY (*)    Hgb urine dipstick LARGE (*)    Ketones, ur 20 (*)    Protein, ur 100 (*)    Leukocytes,Ua  LARGE (*)    RBC / HPF >50 (*)    WBC, UA >50 (*)    Bacteria, UA RARE (*)    All other components within normal limits  CULTURE, BLOOD (ROUTINE X 2)  CULTURE, BLOOD (ROUTINE X 2)  URINE CULTURE  LACTIC ACID, PLASMA  LACTIC ACID, PLASMA    EKG None  Radiology No results found.  Procedures Procedures    Medications Ordered in ED Medications  ceFEPIme (MAXIPIME) 2 g in sodium chloride 0.9 % 100 mL IVPB (has no administration in time range)  vancomycin (VANCOREADY) IVPB 2000 mg/400 mL (has no administration in time range)  vancomycin (VANCOREADY) IVPB 1500 mg/300 mL (has no administration in time range)  cefTRIAXone (ROCEPHIN) 2 g in sodium chloride 0.9 % 100 mL IVPB (0 g Intravenous Stopped 03/09/22 2040)  lactated ringers bolus 1,000 mL (0 mLs Intravenous Stopped 03/09/22 2202)  oxyCODONE (Oxy IR/ROXICODONE) immediate release tablet 5 mg (5 mg Oral Given 03/09/22 2123)  acetaminophen (TYLENOL) tablet 650 mg (650 mg Oral Given 03/09/22 2123)  lactated ringers bolus 500 mL (500 mLs Intravenous New Bag/Given 03/09/22 2202)    ED Course/ Medical Decision Making/ A&P                           Medical Decision Making Amount and/or Complexity of Data Reviewed Labs: ordered.  Risk OTC drugs. Prescription drug management. Decision regarding hospitalization.   ISLAM EICHINGER is a 61 y.o. male with a history of MS, neurogenic bladder with Foley placement, recurrent UTIs, and asthma presenting to the ED with concerns  for UTI.  On exam, the patient is hemodynamically stable with initial temperature of 99.2.  He is mildly tachypneic but is not in respiratory distress and has clear lung sounds in all fields.  Abdomen is soft and nontender in all quadrants.  Patient does have a Foley catheter in place draining yellow urine.  There is some sediment noted within the bag/tubing.  Patient's wife states that his current symptoms are very consistent with his prior episodes of UTI/urosepsis.  She had not initiated on antibiotics this weekend as she had not been able to speak with his infectious disease doctor.  She states that he usually takes ciprofloxacin and amoxicillin for his UTIs.  On chart review, patient does have a history of Pseudomonas and Enterococcus back in 10/2020.  CBC is notable for leukocytosis with a WBC of 19.3.  CMP is unremarkable.  Lactic acid is 1.1.  Patient's Foley catheter was exchanged and a UA and urine culture were collected.  UA shows large leukocytes and many white blood cells concerning for infection.  With a temperature Foley catheter now in place, patient was found to be febrile to 100.8, concerning for urosepsis.  Patient was given 1.5 L of IV fluid (did not give full 30cc/kg given patient's history of heart failure).  Given patient's history of Pseudomonas and Enterococcus, patient was given IV vancomycin and IV cefepime.  Urine culture and blood cultures sent.  Hospitalist team was contacted for admission and the patient was admitted to their service in stable condition.        Final Clinical Impression(s) / ED Diagnoses Final diagnoses:  Urinary tract infection associated with indwelling urethral catheter, initial encounter [facility])    Rx / DC Orders ED Discharge Orders     None         Laurence Compton, MD 03/09/22 2223    Alvira Monday,  MD 03/10/22 1241

## 2022-03-09 NOTE — ED Triage Notes (Signed)
Patient BIB [facility] from home c/o possible UTI.  Patient reports foul smelling urine and calcifications in urine x 1 month.  Patient's family contacted PCP on Friday for antibiotic orders with no response.  Patient reports feeling unwell today.  Patient has history of UTIs and urosepsis.   99 T 124/78 119 CBG 95% RA

## 2022-03-09 NOTE — ED Notes (Signed)
While this RN was in the room prior to administering fentanyl, patient began seeing a figure in the corner of his room.  Patient stated it was his grandfather.  Patient's wife at bedside informed patient that there was no one there and able to comfort patient.  MD Malen Gauze called to bedside to assess patient due to mental status change.

## 2022-03-09 NOTE — ED Notes (Signed)
Urine returned in foley catheter, but not enough currently for a sample.  MD notified, will obtain sample when there is enough urine.

## 2022-03-10 ENCOUNTER — Other Ambulatory Visit: Payer: Self-pay

## 2022-03-10 ENCOUNTER — Inpatient Hospital Stay (HOSPITAL_COMMUNITY): Payer: No Typology Code available for payment source

## 2022-03-10 DIAGNOSIS — T83511A Infection and inflammatory reaction due to indwelling urethral catheter, initial encounter: Secondary | ICD-10-CM | POA: Diagnosis not present

## 2022-03-10 DIAGNOSIS — N390 Urinary tract infection, site not specified: Secondary | ICD-10-CM

## 2022-03-10 DIAGNOSIS — A419 Sepsis, unspecified organism: Secondary | ICD-10-CM

## 2022-03-10 LAB — CBC
HCT: 38.3 % — ABNORMAL LOW (ref 39.0–52.0)
Hemoglobin: 12.7 g/dL — ABNORMAL LOW (ref 13.0–17.0)
MCH: 30.8 pg (ref 26.0–34.0)
MCHC: 33.2 g/dL (ref 30.0–36.0)
MCV: 92.7 fL (ref 80.0–100.0)
Platelets: 183 10*3/uL (ref 150–400)
RBC: 4.13 MIL/uL — ABNORMAL LOW (ref 4.22–5.81)
RDW: 13.5 % (ref 11.5–15.5)
WBC: 22 10*3/uL — ABNORMAL HIGH (ref 4.0–10.5)
nRBC: 0 % (ref 0.0–0.2)

## 2022-03-10 LAB — LACTIC ACID, PLASMA: Lactic Acid, Venous: 1.1 mmol/L (ref 0.5–1.9)

## 2022-03-10 LAB — HIV ANTIBODY (ROUTINE TESTING W REFLEX): HIV Screen 4th Generation wRfx: NONREACTIVE

## 2022-03-10 MED ORDER — ENOXAPARIN SODIUM 40 MG/0.4ML IJ SOSY
40.0000 mg | PREFILLED_SYRINGE | INTRAMUSCULAR | Status: DC
  Administered 2022-03-10 – 2022-03-12 (×3): 40 mg via SUBCUTANEOUS
  Filled 2022-03-10 (×3): qty 0.4

## 2022-03-10 MED ORDER — ESCITALOPRAM OXALATE 10 MG PO TABS
10.0000 mg | ORAL_TABLET | Freq: Every day | ORAL | Status: DC
  Administered 2022-03-10 – 2022-03-12 (×3): 10 mg via ORAL
  Filled 2022-03-10 (×3): qty 1

## 2022-03-10 MED ORDER — VITAMIN D 25 MCG (1000 UNIT) PO TABS
1000.0000 [IU] | ORAL_TABLET | Freq: Every day | ORAL | Status: DC
  Administered 2022-03-10 – 2022-03-12 (×3): 1000 [IU] via ORAL
  Filled 2022-03-10 (×3): qty 1

## 2022-03-10 MED ORDER — POLYETHYLENE GLYCOL 3350 17 G PO PACK
17.0000 g | PACK | Freq: Every day | ORAL | Status: DC
  Administered 2022-03-10: 17 g via ORAL
  Filled 2022-03-10 (×2): qty 1

## 2022-03-10 MED ORDER — SODIUM CHLORIDE 0.9 % IV SOLN: INTRAVENOUS | Status: AC

## 2022-03-10 MED ORDER — VITAMIN C 250 MG PO TABS
125.0000 mg | ORAL_TABLET | Freq: Every day | ORAL | Status: DC
  Administered 2022-03-10 – 2022-03-12 (×3): 125 mg via ORAL
  Filled 2022-03-10 (×3): qty 1

## 2022-03-10 MED ORDER — SODIUM CHLORIDE 0.9 % IV BOLUS
1000.0000 mL | Freq: Once | INTRAVENOUS | Status: AC
  Administered 2022-03-10: 1000 mL via INTRAVENOUS

## 2022-03-10 MED ORDER — ACETAMINOPHEN 650 MG RE SUPP: 650.0000 mg | Freq: Four times a day (QID) | RECTAL | Status: DC | PRN

## 2022-03-10 MED ORDER — GABAPENTIN 100 MG PO CAPS
100.0000 mg | ORAL_CAPSULE | Freq: Every day | ORAL | Status: DC | PRN
  Administered 2022-03-10: 100 mg via ORAL
  Filled 2022-03-10 (×2): qty 1

## 2022-03-10 MED ORDER — METHOCARBAMOL 500 MG PO TABS
500.0000 mg | ORAL_TABLET | Freq: Every day | ORAL | Status: DC | PRN
  Administered 2022-03-10: 500 mg via ORAL
  Filled 2022-03-10 (×2): qty 1

## 2022-03-10 MED ORDER — KETOROLAC TROMETHAMINE 15 MG/ML IJ SOLN
7.5000 mg | Freq: Once | INTRAMUSCULAR | Status: AC
  Administered 2022-03-10: 7.5 mg via INTRAVENOUS
  Filled 2022-03-10: qty 1

## 2022-03-10 MED ORDER — MODAFINIL 100 MG PO TABS
100.0000 mg | ORAL_TABLET | Freq: Every day | ORAL | Status: DC
  Administered 2022-03-10 – 2022-03-12 (×3): 100 mg via ORAL
  Filled 2022-03-10 (×2): qty 1

## 2022-03-10 MED ORDER — ACETAMINOPHEN 325 MG PO TABS
650.0000 mg | ORAL_TABLET | Freq: Four times a day (QID) | ORAL | Status: DC | PRN
  Administered 2022-03-10 – 2022-03-12 (×6): 650 mg via ORAL
  Filled 2022-03-10 (×7): qty 2

## 2022-03-10 MED ORDER — VANCOMYCIN HCL 1500 MG/300ML IV SOLN
1500.0000 mg | INTRAVENOUS | Status: DC
  Administered 2022-03-10: 1500 mg via INTRAVENOUS
  Filled 2022-03-10: qty 300

## 2022-03-10 MED ORDER — CHLORHEXIDINE GLUCONATE CLOTH 2 % EX PADS
6.0000 | MEDICATED_PAD | Freq: Every day | CUTANEOUS | Status: DC
  Administered 2022-03-10 – 2022-03-12 (×3): 6 via TOPICAL

## 2022-03-10 MED ORDER — DEXTROMETHORPHAN-QUINIDINE 20-10 MG PO CAPS
1.0000 | ORAL_CAPSULE | Freq: Every day | ORAL | Status: DC
  Administered 2022-03-10 – 2022-03-12 (×3): 1 via ORAL
  Filled 2022-03-10 (×3): qty 1

## 2022-03-10 NOTE — TOC Initial Note (Signed)
Transition of Care [facility]) - Initial/Assessment Note    Patient Details  Name: [name] MRN: [id number] Date of Birth: 1961/01/12  Transition of Care [facility]) CM/SW Contact:    Tom-Johnson, Hershal Coria, RN Phone Number: 03/10/2022, 1:52 PM  Clinical Narrative:                  CM spoke with patient and spouse, [name] at bedside about needs for post hospital transition. Admitted for UTI/Urosepsis. Currently on IV abx.  From home with wife. Has two supportive children.  [name] states patient is totally disabled. Handicapped home built just for patient. Has all necessary DME's at home.  Patient is active with [facility] but [name] states they get their services from [place]. [facility] and [facility], spoke with Jerred and Notification # is [phone] and Authorization # is [id number]. No PT/OT recommendation noted at this time. CM will continue to follow with needs.    Barriers to Discharge: Continued Medical Work up   Patient Goals and CMS Choice Patient states their goals for this hospitalization and ongoing recovery are:: To return home. CMS Medicare.gov Compare Post Acute Care list provided to:: Patient    Expected Discharge Plan and Services     Discharge Planning Services: CM Consult   Living arrangements for the past 2 months: Single Family Home                 DME Arranged: N/A DME Agency: NA                  Prior Living Arrangements/Services Living arrangements for the past 2 months: Single Family Home Lives with:: Spouse Patient language and need for interpreter reviewed:: Yes Do you feel safe going back to the place where you live?: Yes      Need for Family Participation in Patient Care: Yes (Comment) Care giver support system in place?: Yes (comment) Current home services: DME (Handicapped proof home with all DMEs.) Criminal Activity/Legal Involvement Pertinent to Current Situation/Hospitalization: No - Comment  as needed  Activities of Daily Living Home Assistive Devices/Equipment: Sling (specify type), Eyeglasses, [employer], Hospital bed ADL Screening (condition at time of admission) Patient's cognitive ability adequate to safely complete daily activities?: Yes Is the patient deaf or have difficulty hearing?: No Does the patient have difficulty seeing, even when wearing glasses/contacts?: No Does the patient have difficulty concentrating, remembering, or making decisions?: No Patient able to express need for assistance with ADLs?: Yes Does the patient have difficulty dressing or bathing?: Yes Independently performs ADLs?: No Communication: Independent Dressing (OT): Dependent Is this a change from baseline?: Pre-admission baseline Grooming: Dependent Is this a change from baseline?: Pre-admission baseline Feeding: Independent Bathing: Dependent Is this a change from baseline?: Pre-admission baseline Toileting: Dependent Is this a change from baseline?: Pre-admission baseline In/Out Bed: Dependent Is this a change from baseline?: Pre-admission baseline Walks in Home: Dependent Is this a change from baseline?: Pre-admission baseline Does the patient have difficulty walking or climbing stairs?: Yes Weakness of Legs: Both Weakness of Arms/Hands: Right  Permission Sought/Granted Permission sought to share information with : Case Manager, Magazine features editor, Family Supports Permission granted to share information with : Yes, Verbal Permission Granted              Emotional Assessment Appearance:: Appears stated age Attitude/Demeanor/Rapport: Engaged, Gracious Affect (typically observed): Accepting, Appropriate, Calm, Hopeful Orientation: : Oriented to Self, Oriented to Place, Oriented to  Time, Oriented to Situation  Alcohol / Substance Use: Not Applicable Psych Involvement: No (comment)  Admission diagnosis:  Catheter-associated urinary tract infection (HCC) [T83.511A,  N39.0] Urinary tract infection associated with indwelling urethral catheter, initial encounter (HCC) [K02.542H, N39.0] Patient Active Problem List   Diagnosis Date Noted   Sepsis (HCC) 03/10/2022   Catheter-associated urinary tract infection (HCC) 03/09/2022   UTI (urinary tract infection) 10/13/2020   Lactic acidosis 02/23/2020   Sepsis due to Enterococcus (HCC) 02/23/2020   Enterococcus UTI 02/23/2020   Hyperkalemia 02/23/2020   Penile bleeding 02/23/2020   Acute respiratory failure with hypoxemia (HCC) 02/23/2020   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 AT CORNER OF [facility] [address] [place] [address] Phone: [phone] Fax: [phone]  [facility] - [place], [place] - [address] [place] [address] Phone: [phone] Fax: [phone]  [facility] - [name], [place] - [address] [facility] [address] [place] [address] [place] [address] Phone: [phone] Fax: [phone]     Social Determinants of Health (SDOH) Interventions    Readmission Risk Interventions     View : No data to display.

## 2022-03-10 NOTE — H&P (Signed)
History and Physical    [name] WPY:[id number] DOB: [birth year] DOA: 03/09/2022  PCP: Agustina Caroli, MD  Patient coming from: Home  Chief Complaint: Concern for recurrent UTI  HPI: [name] is a 61 y.o. male with medical history significant of secondary progressive MS with neurogenic bladder and indwelling Foley catheter, spastic quadriparesis, history of Pseudomonas UTI presented to the ED with concern for recurrent UTI.  In the ED, patient was febrile and slightly tachypneic.  Labs showing WBC 19.3, hemoglobin 15.1, platelet count 244k.  Sodium 140, potassium 4.3, chloride 105, bicarb 27, BUN 16, creatinine 1.0, glucose 111.  LFTs normal.  Lactic acid 1.1.  Blood cultures drawn.  UA with signs of infection (large amount of leukocytes and microscopy showing greater than 50 WBCs).  Urine culture pending. Foley catheter was exchanged.  Patient was given Tylenol, fentanyl, oxycodone, cefepime, ceftriaxone, vancomycin, and 1.5 L LR boluses.  Patient reports concern for UTI.  Reports fever of 100 F at home and urine dark in color with sediment.  He has a chronic indwelling Foley catheter and is seen by infectious disease and urology at the [place].  Foley is exchanged every month and last on April 3rd.  It was exchanged again in the ED tonight.  Denies abdominal or flank pain.  Denies nausea or vomiting.  Reports increasing shortness of breath for the past 2 or 3 days.  Denies cough or chest pain.  Review of Systems:  Review of Systems  All other systems reviewed and are negative.  Past Medical History:  Diagnosis Date   Abnormality of gait 11/21/2015   Asthma    childhood asthma   Classic migraine    Depression    Dysphagia    Hay fever    Headache syndrome 12/22/2018   MS (multiple sclerosis) (HCC)    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    Past Surgical History:  Procedure Laterality Date   eye surgeries     x 2; bilateral 74 and 76     reports that he has quit smoking. He has  never used smokeless tobacco. He reports that he does not drink alcohol and does not use drugs.  No Known Allergies  Family History  Problem Relation Age of Onset   Cancer Father    Multiple sclerosis Sister    Seizures Maternal Uncle    Parkinsonism Maternal Uncle    Multiple sclerosis Sister    Multiple sclerosis Paternal Uncle    Multiple sclerosis Other    Lung cancer Other        parent   Uterine cancer Other        other    Prior to Admission medications   Medication Sig Start Date End Date Taking? Authorizing Provider  albuterol (VENTOLIN HFA) 108 (90 Base) MCG/ACT inhaler Inhale 2 puffs into the lungs every 6 (six) hours as needed. Patient taking differently: Inhale 1-2 puffs into the lungs every 6 (six) hours as needed for wheezing. 07/24/21  Yes York Spaniel, MD  Ascorbic Acid (VITAMIN C) 100 MG tablet Take 100 mg by mouth daily.   Yes [provider]  baclofen (LIORESAL) 10 MG tablet 1/2 tablet in the morning and at noon, and 2 tablets at night Patient taking differently: Take 20 mg by mouth daily as needed for muscle spasms. 05/20/16  Yes York Spaniel, MD  Carboxymethylcellulose Sod PF (THERATEARS) 1 % GEL Place 1 drop into both eyes daily as needed (dry eyes).   Yes  [provider]  Carboxymethylcellulose Sodium (THERATEARS OP) Place 1 drop into both eyes daily as needed (dry eyes).   Yes [provider]  cholecalciferol (VITAMIN D3) 25 MCG (1000 UNIT) tablet Take 1,000 Units by mouth daily.   Yes [provider]  D-MANNOSE PO Take 1,300 mg by mouth See admin instructions. 1300 mg with cranberry extract for uti   Yes [provider]  Dextromethorphan-Quinidine 20-10 MG CAPS Take 1 tablet by mouth 2 (two) times daily. Patient taking differently: Take 1 capsule by mouth daily. 05/27/17  Yes York Spaniel, MD  escitalopram (LEXAPRO) 10 MG tablet Take 10 mg by mouth daily.   Yes [provider]  gabapentin  (NEURONTIN) 100 MG capsule Take 1 capsule (100 mg total) by mouth at bedtime. Patient taking differently: Take 100 mg by mouth daily as needed (pain). 01/20/21  Yes York Spaniel, MD  ibuprofen (ADVIL) 200 MG tablet Take 600 mg by mouth every 6 (six) hours as needed for headache or moderate pain.   Yes [provider]  methenamine (HIPREX) 1 g tablet Take 1 g by mouth 2 (two) times daily with a meal.   Yes [provider]  methocarbamol (ROBAXIN) 500 MG tablet Take 1 tablet (500 mg total) by mouth 3 (three) times daily. Patient taking differently: Take 500 mg by mouth daily as needed for muscle spasms. 01/05/22  Yes Sater, Pearletha Furl, MD  modafinil (PROVIGIL) 100 MG tablet TAKE 1 TABLET (100 MG TOTAL) BY MOUTH IN THE MORNING. *SENT FOR PA* Patient taking differently: Take 100 mg by mouth daily. 11/29/20  Yes York Spaniel, MD  amoxicillin (AMOXIL) 500 MG tablet Take 1 tablet (500 mg total) by mouth 2 (two) times daily. Patient not taking: Reported on 03/09/2022 07/16/21   Felicie Morn, NP  ciprofloxacin (CIPRO) 500 MG tablet Take 1 tablet (500 mg total) by mouth 2 (two) times daily. Patient not taking: Reported on 03/09/2022 07/16/21   Felicie Morn, NP  ocrelizumab 600 mg in sodium chloride 0.9 % 500 mL Inject 600 mg into the vein every 6 (six) months.     [provider]    Physical Exam: Vitals:   03/09/22 2230 03/09/22 2245 03/09/22 2257 03/09/22 2333  BP: 131/84 123/87  (!) 112/52  Pulse: 99 (!) 101 96 94  Resp: (!) 30 (!) 40 (!) 24 16  Temp: (!) 101 F (38.3 C) (!) 101.1 F (38.4 C) (!) 101.2 F (38.4 C) 100.2 F (37.9 C)  TempSrc:    Oral  SpO2: 100% 99% 98% 93%  Weight:      Height:        Physical Exam Vitals reviewed.  Constitutional:      General: He is not in acute distress. HENT:     Head: Normocephalic and atraumatic.     Mouth/Throat:     Mouth: Mucous membranes are dry.  Eyes:     Extraocular Movements: Extraocular movements intact.      Conjunctiva/sclera: Conjunctivae normal.  Cardiovascular:     Rate and Rhythm: Normal rate and regular rhythm.     Pulses: Normal pulses.  Pulmonary:     Effort: Pulmonary effort is normal. No respiratory distress.     Breath sounds: No wheezing or rales.  Abdominal:     General: Bowel sounds are normal.     Palpations: Abdomen is soft.     Tenderness: There is no abdominal tenderness. There is no guarding.  Musculoskeletal:  Cervical back: Normal range of motion.     Right lower leg: Edema present.     Left lower leg: Edema present.     Comments: 2+ pitting edema of bilateral lower extremities  Skin:    General: Skin is warm and dry.  Neurological:     Mental Status: He is alert and oriented to person, place, and time. Mental status is at baseline.     Labs on Admission: I have personally reviewed following labs and imaging studies  CBC: Recent Labs  Lab 03/09/22 1935  WBC 19.3*  NEUTROABS 17.7*  HGB 15.1  HCT 46.2  MCV 93.3  PLT 244   Basic Metabolic Panel: Recent Labs  Lab 03/09/22 1935  NA 140  K 4.3  CL 105  CO2 27  GLUCOSE 111*  BUN 16  CREATININE 1.03  CALCIUM 9.0   GFR: Estimated Creatinine Clearance: 86.5 mL/min (by C-G formula based on SCr of 1.03 mg/dL). Liver Function Tests: Recent Labs  Lab 03/09/22 1935  AST 17  ALT 19  ALKPHOS 120  BILITOT 0.5  PROT 6.5  ALBUMIN 4.2   No results for input(s): LIPASE, AMYLASE in the last 168 hours. No results for input(s): AMMONIA in the last 168 hours. Coagulation Profile: No results for input(s): INR, PROTIME in the last 168 hours. Cardiac Enzymes: No results for input(s): CKTOTAL, CKMB, CKMBINDEX, TROPONINI in the last 168 hours. BNP (last 3 results) No results for input(s): PROBNP in the last 8760 hours. HbA1C: No results for input(s): HGBA1C in the last 72 hours. CBG: No results for input(s): GLUCAP in the last 168 hours. Lipid Profile: No results for input(s): CHOL, HDL, LDLCALC,  TRIG, CHOLHDL, LDLDIRECT in the last 72 hours. Thyroid Function Tests: No results for input(s): TSH, T4TOTAL, FREET4, T3FREE, THYROIDAB in the last 72 hours. Anemia Panel: No results for input(s): VITAMINB12, FOLATE, FERRITIN, TIBC, IRON, RETICCTPCT in the last 72 hours. Urine analysis:    Component Value Date/Time   COLORURINE YELLOW 03/09/2022 2130   APPEARANCEUR CLOUDY (A) 03/09/2022 2130   APPEARANCEUR Clear 07/29/2020 1429   LABSPEC 1.017 03/09/2022 2130   PHURINE 7.0 03/09/2022 2130   GLUCOSEU NEGATIVE 03/09/2022 2130   HGBUR LARGE (A) 03/09/2022 2130   BILIRUBINUR NEGATIVE 03/09/2022 2130   BILIRUBINUR Negative 07/29/2020 1429   KETONESUR 20 (A) 03/09/2022 2130   PROTEINUR 100 (A) 03/09/2022 2130   UROBILINOGEN 0.2 09/12/2018 1534   NITRITE NEGATIVE 03/09/2022 2130   LEUKOCYTESUR LARGE (A) 03/09/2022 2130    Radiological Exams on Admission: I have personally reviewed images No results found.  Assessment and Plan  Severe sepsis secondary to CAUTI Febrile and WBC 19.3.  Initial lactic acid normal.  Blood pressure currently low with systolic in the 80s to 90s, not tachycardic. UA with signs of infection (large amount of leukocytes and microscopy showing greater than 50 WBCs). Patient with history of Pseudomonas UTI in January 2022. -Foley catheter exchanged -Continue cefepime -Patient received 1.5 L fluid boluses in the ED, since blood pressure is low, additional 1 L bolus ordered.  Appears dry on exam. -Urine and blood cultures pending -Second set of lactate pending -Monitor WBC count  Shortness of breath Currently satting 92% on room air at rest, no respiratory distress.  Lungs clear on exam.  Previous echo done in May 2021 showing normal systolic and diastolic function. -Chest x-ray  Secondary progressive MS, neurogenic bladder, spastic quadriparesis Followed by Dr. Sherol Dade, on Emogene Morgan. -Foley catheter exchanged given concern for UTI.  Monitor  urine  output. -Continue home medications -Outpatient neurology follow-up  DVT prophylaxis: Lovenox Code Status: DNR (discussed with the patient) Family Communication: No family available at this time. Level of care: Telemetry bed Admission status: It is my clinical opinion that admission to INPATIENT is reasonable and necessary because of the expectation that this patient will require hospital care that crosses at least 2 midnights to treat this condition based on the medical complexity of the problems presented.  Given the aforementioned information, the predictability of an adverse outcome is felt to be significant.   John Giovanni MD [facility]  If 7PM-7AM, please contact night-coverage [email or web address]  03/10/2022, 12:20 AM

## 2022-03-10 NOTE — Progress Notes (Signed)
   Patient seen and examined at bedside, patient admitted after midnight, please see earlier detailed admission note by John Giovanni, MD. Briefly, patient presented secondary to concern for UTI with symptoms of fever, chills and rigors. Patient started empirically on Vancomycin and Cefepime. Urine and blood cultures obtained.  Subjective: Patient feeling much better today. Wife is pleased that his mental status has improved.  BP (!) 88/54   Pulse 71   Temp 97.9 F (36.6 C) (Oral)   Resp 17   Ht 5\' 10"  (1.778 m)   Wt 102.3 kg   SpO2 94%   BMI 32.36 kg/m   General exam: Appears calm and comfortable Respiratory system: Clear to auscultation. Respiratory effort normal. Cardiovascular system: S1 & S2 heard, RRR. No murmurs, rubs, gallops or clicks. Gastrointestinal system: Abdomen is distended, soft and nontender. Normal bowel sounds heard. Central nervous system: Alert and oriented.  Musculoskeletal: BLE edema. No calf tenderness Skin: No cyanosis. No rashes Psychiatry: Judgement and insight appear normal. Mood & affect appropriate.   Brief assessment/Plan:  Severe sepsis secondary to CAUTI Catheter exchanged on admission. History of pseudomonas and enterococcus UTI in the past. Started on Vancomycin and Cefepime in the ED. Confusion has significantly improved since admission. Urine and blood cultures obtained and are pending -Continue Cefepime; restart Vancomycin  Hypotension Mild. In setting of sever sepsis. On IV fluids -Continue IV fluids; watch fluid/respiratory status  Abdominal distension Patient with a history of constipation likely related to neuromuscular disease. Frequently requires bowel regimen which include suppository and enema administration. -Miralax daily -Dulcolax suppository PRN  Shortness of breath Appears to be improved. On room air. Chest x-ray suggests some vascular congestion  Multiple sclerosis Neurogenic bladder Spastic quadriparesis Per  H&P   Family communication: Wife at bedside DVT prophylaxis: Lovenox Disposition: Discharge home likely in 2 days pending culture results and transition to outpatient antibiotic regimen if able  , MD [facility] 03/10/2022, 11:48 AM

## 2022-03-11 DIAGNOSIS — G8250 Quadriplegia, unspecified: Secondary | ICD-10-CM

## 2022-03-11 DIAGNOSIS — N319 Neuromuscular dysfunction of bladder, unspecified: Secondary | ICD-10-CM

## 2022-03-11 DIAGNOSIS — T83511A Infection and inflammatory reaction due to indwelling urethral catheter, initial encounter: Secondary | ICD-10-CM | POA: Diagnosis not present

## 2022-03-11 DIAGNOSIS — R6520 Severe sepsis without septic shock: Secondary | ICD-10-CM

## 2022-03-11 DIAGNOSIS — G9340 Encephalopathy, unspecified: Secondary | ICD-10-CM

## 2022-03-11 DIAGNOSIS — A419 Sepsis, unspecified organism: Secondary | ICD-10-CM

## 2022-03-11 DIAGNOSIS — G35 Multiple sclerosis: Secondary | ICD-10-CM

## 2022-03-11 LAB — BASIC METABOLIC PANEL
Anion gap: 8 (ref 5–15)
BUN: 13 mg/dL (ref 6–20)
CO2: 18 mmol/L — ABNORMAL LOW (ref 22–32)
Calcium: 8.4 mg/dL — ABNORMAL LOW (ref 8.9–10.3)
Chloride: 113 mmol/L — ABNORMAL HIGH (ref 98–111)
Creatinine, Ser: 1.08 mg/dL (ref 0.61–1.24)
GFR, Estimated: >60 mL/min (ref >60)
Glucose, Bld: 82 mg/dL (ref 70–99)
Potassium: 3.9 mmol/L (ref 3.5–5.1)
Sodium: 139 mmol/L (ref 135–145)

## 2022-03-11 LAB — CBC
HCT: 42.3 % (ref 39.0–52.0)
Hemoglobin: 13.8 g/dL (ref 13.0–17.0)
MCH: 30.7 pg (ref 26.0–34.0)
MCHC: 32.6 g/dL (ref 30.0–36.0)
MCV: 94.2 fL (ref 80.0–100.0)
Platelets: 157 10*3/uL (ref 150–400)
RBC: 4.49 MIL/uL (ref 4.22–5.81)
RDW: 13.9 % (ref 11.5–15.5)
WBC: 13.5 10*3/uL — ABNORMAL HIGH (ref 4.0–10.5)
nRBC: 0 % (ref 0.0–0.2)

## 2022-03-11 LAB — URINE CULTURE: Culture: 30000 — AB

## 2022-03-11 MED ORDER — IBUPROFEN 600 MG PO TABS
600.0000 mg | ORAL_TABLET | ORAL | Status: DC | PRN
  Administered 2022-03-11 – 2022-03-12 (×2): 600 mg via ORAL
  Filled 2022-03-11 (×3): qty 1

## 2022-03-11 MED ORDER — CEFAZOLIN SODIUM-DEXTROSE 1-4 GM/50ML-% IV SOLN
1.0000 g | Freq: Three times a day (TID) | INTRAVENOUS | Status: DC
  Administered 2022-03-11 – 2022-03-12 (×4): 1 g via INTRAVENOUS
  Filled 2022-03-11 (×6): qty 50

## 2022-03-11 NOTE — Plan of Care (Signed)
°  Problem: Coping: °Goal: Level of anxiety will decrease °Outcome: Progressing °  °

## 2022-03-11 NOTE — Progress Notes (Signed)
PROGRESS NOTE    [name]  BLT:[id number] DOB: [birth year] DOA: 03/09/2022 PCP: Agustina Caroli, MD   Brief Narrative:  [name] is a 61 y.o. male with medical history significant of secondary progressive MS with neurogenic bladder and indwelling Foley catheter, spastic quadriparesis, history of Pseudomonas UTI presented to the ED with concern for recurrent UTI.  Assessment & Plan:   Principal Problem:   Catheter-associated urinary tract infection (HCC) Active Problems:   Neurogenic bladder   Spastic quadriparesis (HCC)   MS (multiple sclerosis) (HCC)   Sepsis (HCC)  Severe sepsis secondary to CAUTI (chronic Foley), POA Klebsiella pneumoniae positive culture, likely colonized  -Cultures resulted with Klebsiella today, de-escalate antibiotics per discussion with pharmacy to cefazolin monotherapy -follow over the next 24 hours consider transitioning to p.o. at discharge -Fevers downtrending appropriately, leukocytosis downtrending as well -Clinically improving   Shortness of breath -Without hypoxia -Previous echo done in May 2021 showing normal systolic and diastolic function. -Chest x-ray limited due to expiration imaging   Musculoskeletal neck and back pain, chronic  -Continue Tylenol, add ibuprofen and heating pad alternatively -Avoid narcotics  Secondary progressive MS, neurogenic bladder, spastic quadriparesis Followed by Dr. Sherol Dade, on Emogene Morgan. -Foley catheter exchanged given above UTI.  Monitor urine output. -Continue home medications -Outpatient neurology follow-up   DVT prophylaxis: Lovenox Code Status: DNR (discussed with the patient) Family Communication: Discussed with wife and daughter at bedside  Status is: Inpatient  Dispo: The patient is from: Home              Anticipated d/c is to: Home              Anticipated d/c date is: 24 to 48 hours pending clinical course              Patient currently not medically stable for  discharge  Consultants:  None  Procedures:  None  Antimicrobials:  Cefazolin  Subjective: No acute issues or events overnight denies nausea vomiting diarrhea constipation headache fevers chills or chest pain.  Shoulder neck pain ongoing, appears to be musculoskeletal.  Objective: Vitals:   03/10/22 0827 03/10/22 1600 03/10/22 2128 03/11/22 0529  BP: (!) 88/54 (!) 94/49 (!) 100/52 (!) 101/57  Pulse: 71 81 76 86  Resp: 17 17 19 18   Temp: 97.9 F (36.6 C) 100.2 F (37.9 C) 98.9 F (37.2 C) 99.6 F (37.6 C)  TempSrc: Oral Oral    SpO2: 94% 96% 95% 95%  Weight:      Height:        Intake/Output Summary (Last 24 hours) at 03/11/2022 0721 Last data filed at 03/11/2022 0245 Gross per 24 hour  Intake 2237.58 ml  Output 1375 ml  Net 862.58 ml   Filed Weights   03/09/22 1917 03/09/22 2333  Weight: 99.8 kg 102.3 kg    Examination:  General:  Pleasantly resting in bed, No acute distress.  Somnolent but easily arousable. HEENT:  Normocephalic atraumatic.  Sclerae nonicteric, noninjected.  Extraocular movements intact bilaterally. Neck:  Without mass or deformity.  Trachea is midline. Lungs:  Clear to auscultate bilaterally without rhonchi, wheeze, or rales. Heart:  Regular rate and rhythm.  Without murmurs, rubs, or gallops. Abdomen:  Soft, nontender, nondistended.  Without guarding or rebound. Extremities: Without cyanosis, clubbing, edema, or obvious deformity. Vascular:  Dorsalis pedis and posterior tibial pulses palpable bilaterally. Skin:  Warm and dry, no erythema, no ulcerations.  Data Reviewed: I have personally reviewed following labs and imaging studies  CBC: Recent Labs  Lab 03/09/22 1935 03/10/22 0309 03/11/22 0421  WBC 19.3* 22.0* 13.5*  NEUTROABS 17.7*  --   --   HGB 15.1 12.7* 13.8  HCT 46.2 38.3* 42.3  MCV 93.3 92.7 94.2  PLT 244 183 157   Basic Metabolic Panel: Recent Labs  Lab 03/09/22 1935 03/11/22 0421  NA 140 139  K 4.3 3.9  CL 105  113*  CO2 27 18*  GLUCOSE 111* 82  BUN 16 13  CREATININE 1.03 1.08  CALCIUM 9.0 8.4*   GFR: Estimated Creatinine Clearance: 89.3 mL/min (by C-G formula based on SCr of 1.08 mg/dL). Liver Function Tests: Recent Labs  Lab 03/09/22 1935  AST 17  ALT 19  ALKPHOS 120  BILITOT 0.5  PROT 6.5  ALBUMIN 4.2   No results for input(s): LIPASE, AMYLASE in the last 168 hours. No results for input(s): AMMONIA in the last 168 hours. Coagulation Profile: No results for input(s): INR, PROTIME in the last 168 hours. Cardiac Enzymes: No results for input(s): CKTOTAL, CKMB, CKMBINDEX, TROPONINI in the last 168 hours. BNP (last 3 results) No results for input(s): PROBNP in the last 8760 hours. HbA1C: No results for input(s): HGBA1C in the last 72 hours. CBG: No results for input(s): GLUCAP in the last 168 hours. Lipid Profile: No results for input(s): CHOL, HDL, LDLCALC, TRIG, CHOLHDL, LDLDIRECT in the last 72 hours. Thyroid Function Tests: No results for input(s): TSH, T4TOTAL, FREET4, T3FREE, THYROIDAB in the last 72 hours. Anemia Panel: No results for input(s): VITAMINB12, FOLATE, FERRITIN, TIBC, IRON, RETICCTPCT in the last 72 hours. Sepsis Labs: Recent Labs  Lab 03/09/22 1935 03/10/22 0040  LATICACIDVEN 1.1 1.1    Recent Results (from the past 240 hour(s))  Blood culture (routine x 2)     Status: None (Preliminary result)   Collection Time: 03/09/22  7:45 PM   Specimen: BLOOD  Result Value Ref Range Status   Specimen Description BLOOD LEFT ANTECUBITAL  Final   Special Requests   Final    BOTTLES DRAWN AEROBIC AND ANAEROBIC Blood Culture results may not be optimal due to an excessive volume of blood received in culture bottles   Culture   Final    NO GROWTH < 12 HOURS Performed at [facility], [address], [place], [place] [address]    Report Status PENDING  Incomplete  Urine Culture     Status: Abnormal (Preliminary result)   Collection Time: 03/09/22  7:45 PM    Specimen: Urine, Catheterized  Result Value Ref Range Status   Specimen Description URINE, CATHETERIZED  Final   Special Requests NONE  Final   Culture (A)  Final    30,000 COLONIES/mL GRAM NEGATIVE RODS IDENTIFICATION AND SUSCEPTIBILITIES TO FOLLOW Performed at [facility], [address] [place], [place] [address]    Report Status PENDING  Incomplete    Radiology Studies: DG CHEST PORT 1 VIEW  Result Date: 03/10/2022 CLINICAL DATA:  Shortness of breath. EXAM: PORTABLE CHEST 1 VIEW COMPARISON:  Portable chest 07/16/2021. FINDINGS: The heart is exaggerated by expiratory lung volumes but could be at least mildly enlarged. Central vessels are prominent which could be due to low inspiration or perihilar vascular congestion. Expiratory lung volumes significantly limit the study sensitivity. The lower lung zones are obscured. There are perihilar atelectatic bands. Consolidation would be missed in the lung bases but no focal consolidation is seen above the elevated hemidiaphragms. The mediastinal configuration is stable. No pleural effusion is seen.  The thoracic cage is intact. IMPRESSION: Very limited expiratory exam. Lower zonal infiltrates would be missed. There is mild perihilar vascular congestion versus exaggerated central vessels from low inspiration. There are perihilar atelectatic bands. Electronically Signed   By: Almira Bar M.D.   On: 03/10/2022 07:04    Scheduled Meds:  Chlorhexidine Gluconate Cloth  6 each Topical Daily   cholecalciferol  1,000 Units Oral Daily   Dextromethorphan-quiNIDine  1 capsule Oral Daily   enoxaparin (LOVENOX) injection  40 mg Subcutaneous Q24H   escitalopram  10 mg Oral Daily   modafinil  100 mg Oral Daily   polyethylene glycol  17 g Oral Daily   vitamin C  125 mg Oral Daily   Continuous Infusions:  ceFEPime (MAXIPIME) IV 2 g (03/11/22 0439)   vancomycin 1,500 mg (03/10/22 2303)    LOS: 2 days   Time spent:  Azucena Fallen,  DO [facility]  If 7PM-7AM, please contact night-coverage [email or web address]  03/11/2022, 7:21 AM

## 2022-03-11 NOTE — Evaluation (Signed)
Physical Therapy Evaluation & Discharge Patient Details Name: [name] MRN: [id number] DOB: [birth year] Today's Date: 03/11/2022  History of Present Illness  61 y/o male presented to ED on 03/09/22 for possible UTI. Found to have sepsis 2/2 UTI. PMH: secondary progressive MS, spastic quadriparesis, hx of pseudomonas UTI.  Clinical Impression  Patient admitted with the above. PTA, patient utilizes hoyer lift to get in/out of power w/c with assistance from wife and is [place] for most ADLs. Patient utilizes L UE for tasks but currently limited by weakness and IV placement. Patient overall at baseline functioning for mobility and wife able to assist at discharge. They have all necessary equipment at home with ceiling lift throughout the house. No further skilled PT needs identified acutely. No PT follow up recommended at this time. Recommended an OT consult to focus on L UE strengthening in hopes to maintain strength and motion to perform tasks.        Recommendations for follow up therapy are one component of a multi-disciplinary discharge planning process, led by the attending physician.  Recommendations may be updated based on patient status, additional functional criteria and insurance authorization.  Follow Up Recommendations No PT follow up    Assistance Recommended at Discharge Frequent or constant Supervision/Assistance  Patient can return home with the following       Equipment Recommendations None recommended by PT  Recommendations for Other Services  OT consult    Functional Status Assessment Patient has not had a recent decline in their functional status     Precautions / Restrictions Precautions Precautions: Fall Restrictions Weight Bearing Restrictions: No      Mobility  Bed Mobility               General bed mobility comments: uses hoyer lift at baseline as wife unable to roll patient. She has specialized sling to utilize rather than traditional sling     Transfers                        Ambulation/Gait                  Stairs            Wheelchair Mobility    Modified Rankin (Stroke Patients Only)       Balance                                             Pertinent Vitals/Pain Pain Assessment Pain Assessment: Faces Faces Pain Scale: Hurts even more Pain Location: L elbow at IV site Pain Descriptors / Indicators: Grimacing, Discomfort Pain Intervention(s): Monitored during session, Repositioned    Home Living Family/patient expects to be discharged to:: Private residence Living Arrangements: Spouse/significant other Available Help at Discharge: Family;Available 24 hours/day Type of Home: House Home Access: Level entry       Home Layout: One level Home Equipment: Wheelchair - power;Other (comment);Hospital bed (ceiling lift in each room in house) Additional Comments: built their house 5 years ago through the [place] and is handicap accessible.    Prior Function Prior Level of Function : Needs assist             Mobility Comments: hoyer lift for all mobility ADLs Comments: able to feed self and wash face with L UE     Hand Dominance   Dominant Hand:  Right (has learned to be L handed due to R UE weakness)    Extremity/Trunk Assessment   Upper Extremity Assessment Upper Extremity Assessment: Defer to OT evaluation (R hand with limited strength and motion. Has taught himself how to complete tasks with L hand as he was R hand dominant. Able to bring L hand about 6-8 inches from mouth before pain from IV site limits him)    Lower Extremity Assessment Lower Extremity Assessment: RLE deficits/detail;LLE deficits/detail RLE Deficits / Details: 0/5; decreased light touch but does feel pain and temperature RLE Sensation: decreased light touch LLE Deficits / Details: 0/5; decreased light touch but does feel pain and temperature LLE Sensation: decreased light touch        Communication   Communication: Expressive difficulties  Cognition Arousal/Alertness: Awake/alert Behavior During Therapy: WFL for tasks assessed/performed Overall Cognitive Status: Within Functional Limits for tasks assessed                                          General Comments      Exercises     Assessment/Plan    PT Assessment Patient does not need any further PT services  PT Problem List         PT Treatment Interventions      PT Goals (Current goals can be found in the Care Plan section)  Acute Rehab PT Goals Patient Stated Goal: to be able to use my L hand PT Goal Formulation: All assessment and education complete, DC therapy    Frequency       Co-evaluation               AM-PAC PT "6 Clicks" Mobility  Outcome Measure Help needed turning from your back to your side while in a flat bed without using bedrails?: Total Help needed moving from lying on your back to sitting on the side of a flat bed without using bedrails?: Total Help needed moving to and from a bed to a chair (including a wheelchair)?: Total Help needed standing up from a chair using your arms (e.g., wheelchair or bedside chair)?: Total Help needed to walk in hospital room?: Total Help needed climbing 3-5 steps with a railing? : Total 6 Click Score: 6    End of Session   Activity Tolerance: Patient tolerated treatment well Patient left: in bed;with call bell/phone within reach;with family/visitor present Nurse Communication: Mobility status PT Visit Diagnosis: Muscle weakness (generalized) (M62.81)    Time: [id number] PT Time Calculation (min) (ACUTE ONLY): 33 min   Charges:   PT Evaluation $PT Eval Moderate Complexity: 1 Mod PT Treatments $Therapeutic Activity: 8-22 mins        Kemora Pinard A. Gilford Rile PT, DPT Acute Rehabilitation Services Pager [phone] Office [phone]   Linna Hoff 03/11/2022, 4:42 PM

## 2022-03-12 DIAGNOSIS — G35 Multiple sclerosis: Secondary | ICD-10-CM | POA: Diagnosis not present

## 2022-03-12 DIAGNOSIS — A419 Sepsis, unspecified organism: Secondary | ICD-10-CM | POA: Diagnosis not present

## 2022-03-12 DIAGNOSIS — N319 Neuromuscular dysfunction of bladder, unspecified: Secondary | ICD-10-CM | POA: Diagnosis not present

## 2022-03-12 DIAGNOSIS — T83511A Infection and inflammatory reaction due to indwelling urethral catheter, initial encounter: Secondary | ICD-10-CM | POA: Diagnosis not present

## 2022-03-12 LAB — CBC
HCT: 41.3 % (ref 39.0–52.0)
Hemoglobin: 13.6 g/dL (ref 13.0–17.0)
MCH: 30.5 pg (ref 26.0–34.0)
MCHC: 32.9 g/dL (ref 30.0–36.0)
MCV: 92.6 fL (ref 80.0–100.0)
Platelets: 155 10*3/uL (ref 150–400)
RBC: 4.46 MIL/uL (ref 4.22–5.81)
RDW: 13.9 % (ref 11.5–15.5)
WBC: 7.7 10*3/uL (ref 4.0–10.5)
nRBC: 0 % (ref 0.0–0.2)

## 2022-03-12 LAB — BASIC METABOLIC PANEL
Anion gap: 8 (ref 5–15)
BUN: 14 mg/dL (ref 6–20)
CO2: 18 mmol/L — ABNORMAL LOW (ref 22–32)
Calcium: 8.5 mg/dL — ABNORMAL LOW (ref 8.9–10.3)
Chloride: 112 mmol/L — ABNORMAL HIGH (ref 98–111)
Creatinine, Ser: 0.94 mg/dL (ref 0.61–1.24)
GFR, Estimated: >60 mL/min (ref >60)
Glucose, Bld: 84 mg/dL (ref 70–99)
Potassium: 3.9 mmol/L (ref 3.5–5.1)
Sodium: 138 mmol/L (ref 135–145)

## 2022-03-12 MED ORDER — ACETAMINOPHEN 325 MG PO TABS: 650.0000 mg | ORAL_TABLET | Freq: Four times a day (QID) | ORAL | 0 refills | Status: DC | PRN

## 2022-03-12 MED ORDER — ZINC OXIDE 40 % EX OINT
1.0000 | TOPICAL_OINTMENT | Freq: Three times a day (TID) | CUTANEOUS | Status: DC
Start: 2022-03-12 — End: 2022-03-13
  Filled 2022-03-12: qty 57

## 2022-03-12 MED ORDER — CEPHALEXIN 250 MG PO CAPS: 250.0000 mg | ORAL_CAPSULE | Freq: Four times a day (QID) | ORAL | 0 refills | Status: DC

## 2022-03-12 MED ORDER — ZINC OXIDE 40 % EX OINT: 1.0000 "application " | TOPICAL_OINTMENT | Freq: Three times a day (TID) | CUTANEOUS | 0 refills | Status: DC

## 2022-03-12 MED ORDER — POLYETHYLENE GLYCOL 3350 17 G PO PACK: 17.0000 g | PACK | Freq: Every day | ORAL | 0 refills | Status: DC

## 2022-03-12 MED ORDER — CEPHALEXIN 250 MG PO CAPS: 250.0000 mg | ORAL_CAPSULE | Freq: Four times a day (QID) | ORAL | 0 refills | Status: AC

## 2022-03-12 NOTE — Consult Note (Signed)
[facility] Nurse Consult Note: Reason for Consult:Pretibial irritant contact dermatitis (bilateral) resulting from previous skin injuries several years ago (no wound), recurring partial thickness dermatitis vs abrasion vs Stage 2 pressure injury lower buttocks when patient sleeps in chair or recliner. Not present today. Wound type: N/A Pressure Injury POA: N/A Measurement:N/A Wound bed:N/A Drainage (amount, consistency, odor) N/A Periwound:erythema to previously headed areas on the pretibial areas bilaterally, edema Dressing procedure/placement/frequency:  I will provide patient was a mattress replacement with low air loss feature and bilateral Prevalon pressure redistribution heel boots.  Patient's wife and daughter are in the room and provide me with an in-depth review of the POC for skin integrity at home. They inspect skin once or twice daily, and that occasionally the patient chooses to sleep in either his recliner chair or his wheelchair rather than in his bed where there is an alternating pressure pad (APP) overlay. They relay that the bilateral pretibial areas are erythematous, but chronically so, and are from injuries sustained several years ago. While not consistent with keloid formation, the elevations do not contain open areas and are alterations on wound reepithelialization. The recurring area on the buttocks is moisture related but due to the increased frequency of presentation with prolonged sitting, I will classify as moisture PLUS pressure. I will provide Desitin ointment to this area three times daily. Turning and repositioning is in place and time in the supine position is to be minimized.  The family is taught about DermaTherapy bed linens and the role they play in microclimate management, shear force mitigation through low CoF, and antimicrobial properties. They indicate understanding and appreciate this information.   [facility] nursing team will not follow, but will remain available to this  patient, the nursing and medical teams.  Please re-consult if needed. Thanks, Maudie Flakes, MSN, RN, [place], Arther Abbott  Pager# [phone]

## 2022-03-12 NOTE — Discharge Summary (Signed)
Physician Discharge Summary  [name] [id number] DOB: 11/14/1960 DOA: 03/09/2022  PCP: Agustina Caroli, MD  Admit date: 03/09/2022 Discharge date: 03/12/2022  Admitted From: Home Disposition: Home  Recommendations for Outpatient Follow-up:  Follow up with PCP in 1-2 weeks Follow-up with neurology as scheduled  Discharge Condition: Stable CODE STATUS: DNR Diet recommendation: Regular diet as tolerated  Brief/Interim Summary: [name] is a 61 y.o. male with medical history significant of secondary progressive MS with neurogenic bladder and indwelling Foley catheter, spastic quadriparesis, history of Pseudomonas UTI presented to the ED with concern for recurrent UTI.  Severe sepsis secondary to CAUTI (chronic Foley), POA Klebsiella pneumoniae positive culture, likely colonized  -Cultures resulted with Klebsiella today, de-escalate antibiotics per discussion with pharmacy to keflex -Fevers downtrending appropriately, leukocytosis downtrending as well -Clinically improving   Shortness of breath -Without hypoxia -Resolved -Previous echo done in May 2021 showing normal systolic and diastolic function. -Chest x-ray limited due to expiration imaging   Musculoskeletal neck and back pain, chronic  -Continue Tylenol, add ibuprofen and heating pad alternatively -Avoid narcotics   Secondary progressive MS, neurogenic bladder, spastic quadriparesis Followed by Dr. Sherol Dade, on Emogene Morgan. -Foley catheter exchanged given above UTI.  Monitor urine output. -Continue home medications -Outpatient neurology follow-up  Discharge Diagnoses:  Principal Problem:   Catheter-associated urinary tract infection (HCC) Active Problems:   Neurogenic bladder   Spastic quadriparesis (HCC)   MS (multiple sclerosis) (HCC)   Sepsis [facility])    Discharge Instructions  Discharge Instructions     Discharge patient   Complete by: As directed    Discharge disposition: 01-Home or Self Care    Discharge patient date: 03/12/2022      Allergies as of 03/12/2022   No Known Allergies      Medication List     STOP taking these medications    amoxicillin 500 MG tablet Commonly known as: AMOXIL   ciprofloxacin 500 MG tablet Commonly known as: CIPRO       TAKE these medications    acetaminophen 325 MG tablet Commonly known as: TYLENOL Take 2 tablets (650 mg total) by mouth every 6 (six) hours as needed for mild pain, fever or moderate pain (or Fever >/= 101).   albuterol 108 (90 Base) MCG/ACT inhaler Commonly known as: VENTOLIN HFA Inhale 2 puffs into the lungs every 6 (six) hours as needed. What changed:  how much to take reasons to take this   baclofen 10 MG tablet Commonly known as: LIORESAL 1/2 tablet in the morning and at noon, and 2 tablets at night What changed:  how much to take how to take this when to take this reasons to take this additional instructions   cephALEXin 250 MG capsule Commonly known as: KEFLEX Take 1 capsule (250 mg total) by mouth 4 (four) times daily for 10 days.   cholecalciferol 25 MCG (1000 UNIT) tablet Commonly known as: VITAMIN D3 Take 1,000 Units by mouth daily.   D-MANNOSE PO Take 1,300 mg by mouth See admin instructions. 1300 mg with cranberry extract for uti   Dextromethorphan-quiNIDine 20-10 MG capsule Commonly known as: NUEDEXTA Take 1 tablet by mouth 2 (two) times daily. What changed:  how much to take when to take this   escitalopram 10 MG tablet Commonly known as: LEXAPRO Take 10 mg by mouth daily.   gabapentin 100 MG capsule Commonly known as: Neurontin Take 1 capsule (100 mg total) by mouth at bedtime. What changed:  when to take this reasons  to take this   ibuprofen 200 MG tablet Commonly known as: ADVIL Take 600 mg by mouth every 6 (six) hours as needed for headache or moderate pain.   liver oil-zinc oxide 40 % ointment Commonly known as: DESITIN Apply 1 application. topically 3 (three) times  daily.   methenamine 1 g tablet Commonly known as: HIPREX Take 1 g by mouth 2 (two) times daily with a meal.   methocarbamol 500 MG tablet Commonly known as: ROBAXIN Take 1 tablet (500 mg total) by mouth 3 (three) times daily. What changed:  when to take this reasons to take this   modafinil 100 MG tablet Commonly known as: PROVIGIL TAKE 1 TABLET (100 MG TOTAL) BY MOUTH IN THE MORNING. *SENT FOR PA* What changed: See the new instructions.   ocrelizumab 600 mg in sodium chloride 0.9 % 500 mL Inject 600 mg into the vein every 6 (six) months.   polyethylene glycol 17 g packet Commonly known as: MIRALAX / GLYCOLAX Take 17 g by mouth daily. Start taking on: March 13, 2022   Theratears 1 % Gel Generic drug: Carboxymethylcellulose Sod PF Place 1 drop into both eyes daily as needed (dry eyes).   THERATEARS OP Place 1 drop into both eyes daily as needed (dry eyes).   vitamin C 100 MG tablet Take 100 mg by mouth daily.        No Known Allergies  Consultations: None  Procedures/Studies: DG CHEST PORT 1 VIEW  Result Date: 03/10/2022 CLINICAL DATA:  Shortness of breath. EXAM: PORTABLE CHEST 1 VIEW COMPARISON:  Portable chest 07/16/2021. FINDINGS: The heart is exaggerated by expiratory lung volumes but could be at least mildly enlarged. Central vessels are prominent which could be due to low inspiration or perihilar vascular congestion. Expiratory lung volumes significantly limit the study sensitivity. The lower lung zones are obscured. There are perihilar atelectatic bands. Consolidation would be missed in the lung bases but no focal consolidation is seen above the elevated hemidiaphragms. The mediastinal configuration is stable. No pleural effusion is seen. The thoracic cage is intact. IMPRESSION: Very limited expiratory exam. Lower zonal infiltrates would be missed. There is mild perihilar vascular congestion versus exaggerated central vessels from low inspiration. There are  perihilar atelectatic bands. Electronically Signed   By: Almira Bar M.D.   On: 03/10/2022 07:04     Subjective: No acute issues or events overnight, difficulty sleeping but generally improving throughout the day denies chest pain shortness of breath nausea vomiting diarrhea constipation headache fevers or chills   Discharge Exam: Vitals:   03/12/22 1616 03/12/22 1654  BP:  104/65  Pulse:  66  Resp:  18  Temp: 97.8 F (36.6 C) 98 F (36.7 C)  SpO2:  95%   Vitals:   03/12/22 0528 03/12/22 0803 03/12/22 1616 03/12/22 1654  BP: (!) 111/59 120/60  104/65  Pulse: 80 77  66  Resp: 18 18  18   Temp: 99.8 F (37.7 C) 100.2 F (37.9 C) 97.8 F (36.6 C) 98 F (36.7 C)  TempSrc: Oral Oral Oral Oral  SpO2: 95% 94%  95%  Weight:      Height:        General: Pt is alert, awake, not in acute distress Cardiovascular: RRR, S1/S2 +, no rubs, no gallops Respiratory: CTA bilaterally, no wheezing, no rhonchi Abdominal: Soft, NT, ND, bowel sounds + Extremities: 3 out of 5 strength left upper extremity otherwise 1 out of 5    The results of significant diagnostics from  this hospitalization (including imaging, microbiology, ancillary and laboratory) are listed below for reference.     Microbiology: Recent Results (from the past 240 hour(s))  Blood culture (routine x 2)     Status: None (Preliminary result)   Collection Time: 03/09/22  7:45 PM   Specimen: BLOOD  Result Value Ref Range Status   Specimen Description BLOOD LEFT ANTECUBITAL  Final   Special Requests   Final    BOTTLES DRAWN AEROBIC AND ANAEROBIC Blood Culture results may not be optimal due to an excessive volume of blood received in culture bottles   Culture   Final    NO GROWTH 3 DAYS Performed at [facility], [address], [place], [place] [address]    Report Status PENDING  Incomplete  Urine Culture     Status: Abnormal   Collection Time: 03/09/22  7:45 PM   Specimen: Urine, Catheterized  Result Value  Ref Range Status   Specimen Description URINE, CATHETERIZED  Final   Special Requests   Final    NONE Performed at [facility], [address], [place], [place] [address]    Culture 30,000 COLONIES/mL KLEBSIELLA PNEUMONIAE (A)  Final   Report Status 03/11/2022 FINAL  Final   Organism ID, Bacteria KLEBSIELLA PNEUMONIAE (A)  Final      Susceptibility   Klebsiella pneumoniae - MIC*    AMPICILLIN >=32 RESISTANT Resistant     CEFAZOLIN <=4 SENSITIVE Sensitive     CEFEPIME <=0.12 SENSITIVE Sensitive     CEFTRIAXONE <=0.25 SENSITIVE Sensitive     CIPROFLOXACIN <=0.25 SENSITIVE Sensitive     GENTAMICIN <=1 SENSITIVE Sensitive     IMIPENEM <=0.25 SENSITIVE Sensitive     NITROFURANTOIN 128 RESISTANT Resistant     TRIMETH/SULFA <=20 SENSITIVE Sensitive     AMPICILLIN/SULBACTAM 4 SENSITIVE Sensitive     PIP/TAZO <=4 SENSITIVE Sensitive     * 30,000 COLONIES/mL KLEBSIELLA PNEUMONIAE  Blood culture (routine x 2)     Status: None (Preliminary result)   Collection Time: 03/10/22 12:41 AM   Specimen: BLOOD LEFT HAND  Result Value Ref Range Status   Specimen Description BLOOD LEFT HAND  Final   Special Requests   Final    BOTTLES DRAWN AEROBIC AND ANAEROBIC Blood Culture adequate volume   Culture   Final    NO GROWTH 2 DAYS Performed at [facility], [address] [place], [place] [address]    Report Status PENDING  Incomplete     Labs: BNP (last 3 results) No results for input(s): BNP in the last 8760 hours. Basic Metabolic Panel: Recent Labs  Lab 03/09/22 1935 03/11/22 0421 03/12/22 0306  NA 140 139 138  K 4.3 3.9 3.9  CL 105 113* 112*  CO2 27 18* 18*  GLUCOSE 111* 82 84  BUN CREATININE 1.03 1.08 0.94  CALCIUM 9.0 8.4* 8.5*   Liver Function Tests: Recent Labs  Lab 03/09/22 1935  AST 17  ALT 19  ALKPHOS 120  BILITOT 0.5  PROT 6.5  ALBUMIN 4.2   No results for input(s): LIPASE, AMYLASE in the last 168 hours. No results for input(s): AMMONIA in  the last 168 hours. CBC: Recent Labs  Lab 03/09/22 1935 03/10/22 0309 03/11/22 0421 03/12/22 0306  WBC 19.3* 22.0* 13.5* 7.7  NEUTROABS 17.7*  --   --   --   HGB 15.1 12.7* 13.8 13.6  HCT 46.2 38.3* 42.3 41.3  MCV 93.3 92.7 94.2 92.6  PLT 244 183 157 155   Cardiac Enzymes: No results for input(s): CKTOTAL, CKMB, CKMBINDEX, TROPONINI in the last 168 hours. BNP: Invalid input(s): POCBNP CBG: No results for input(s): GLUCAP in the last 168 hours. D-Dimer No results for input(s): DDIMER in the last 72 hours. Hgb A1c No results for input(s): HGBA1C in the last 72 hours. Lipid Profile No results for input(s): CHOL, HDL, LDLCALC, TRIG, CHOLHDL, LDLDIRECT in the last 72 hours. Thyroid function studies No results for input(s): TSH, T4TOTAL, T3FREE, THYROIDAB in the last 72 hours.  Invalid input(s): FREET3 Anemia work up No results for input(s): VITAMINB12, FOLATE, FERRITIN, TIBC, IRON, RETICCTPCT in the last 72 hours. Urinalysis    Component Value Date/Time   COLORURINE YELLOW 03/09/2022 2130   APPEARANCEUR CLOUDY (A) 03/09/2022 2130   APPEARANCEUR Clear 07/29/2020 1429   LABSPEC 1.017 03/09/2022 2130   PHURINE 7.0 03/09/2022 2130   GLUCOSEU NEGATIVE 03/09/2022 2130   HGBUR LARGE (A) 03/09/2022 2130   BILIRUBINUR NEGATIVE 03/09/2022 2130   BILIRUBINUR Negative 07/29/2020 1429   KETONESUR 20 (A) 03/09/2022 2130   PROTEINUR 100 (A) 03/09/2022 2130   UROBILINOGEN 0.2 09/12/2018 1534   NITRITE NEGATIVE 03/09/2022 2130   LEUKOCYTESUR LARGE (A) 03/09/2022 2130   Sepsis Labs Invalid input(s): PROCALCITONIN,  WBC,  LACTICIDVEN Microbiology Recent Results (from the past 240 hour(s))  Blood culture (routine x 2)     Status: None (Preliminary result)   Collection Time: 03/09/22  7:45 PM   Specimen: BLOOD  Result Value Ref Range Status   Specimen Description BLOOD LEFT ANTECUBITAL  Final   Special Requests   Final    BOTTLES DRAWN AEROBIC AND ANAEROBIC Blood Culture results  may not be optimal due to an excessive volume of blood received in culture bottles   Culture   Final    NO GROWTH 3 DAYS Performed at [facility], [address] [place], [place] [address]    Report Status PENDING  Incomplete  Urine Culture     Status: Abnormal   Collection Time: 03/09/22  7:45 PM   Specimen: Urine, Catheterized  Result Value Ref Range Status   Specimen Description URINE, CATHETERIZED  Final   Special Requests   Final    NONE Performed at [facility], [address], [place], [place] [address]    Culture 30,000 COLONIES/mL KLEBSIELLA PNEUMONIAE (A)  Final   Report Status 03/11/2022 FINAL  Final   Organism ID, Bacteria KLEBSIELLA PNEUMONIAE (A)  Final      Susceptibility   Klebsiella pneumoniae - MIC*    AMPICILLIN >=32 RESISTANT Resistant     CEFAZOLIN <=4 SENSITIVE Sensitive     CEFEPIME <=0.12 SENSITIVE Sensitive     CEFTRIAXONE <=0.25 SENSITIVE Sensitive     CIPROFLOXACIN <=0.25 SENSITIVE Sensitive     GENTAMICIN <=1 SENSITIVE Sensitive     IMIPENEM <=0.25 SENSITIVE Sensitive     NITROFURANTOIN 128 RESISTANT Resistant     TRIMETH/SULFA <=20 SENSITIVE Sensitive     AMPICILLIN/SULBACTAM 4 SENSITIVE Sensitive     PIP/TAZO <=4 SENSITIVE Sensitive     * 30,000 COLONIES/mL KLEBSIELLA PNEUMONIAE  Blood culture (routine x 2)     Status: None (Preliminary result)   Collection Time: 03/10/22 12:41 AM   Specimen: BLOOD LEFT HAND  Result Value Ref Range Status   Specimen Description BLOOD LEFT HAND  Final   Special Requests   Final    BOTTLES DRAWN AEROBIC AND ANAEROBIC Blood Culture adequate volume   Culture  Final    NO GROWTH 2 DAYS Performed at [facility], [address], [place], [place] [address]    Report Status PENDING  Incomplete     Time coordinating discharge: Over 30 minutes  SIGNED:   Azucena Fallen, DO [facility] 03/12/2022, 6:49 PM Pager   If 7PM-7AM, please contact night-coverage [email or web address]

## 2022-03-12 NOTE — Progress Notes (Signed)
DISCHARGE NOTE HOME SEAB AXEL to be discharged home per MD order. Discussed prescriptions and follow up appointments with the patient. Prescriptions given to patient; medication list explained in detail. Patient verbalized understanding.  Skin clean, dry and intact without evidence of skin break down, no evidence of skin tears noted. IV catheter discontinued intact. Site without signs and symptoms of complications. Dressing and pressure applied. Pt denies pain at the site currently. No complaints noted.  Patient free of lines, drains, and wounds.   An After Visit Summary (AVS) was printed and given to the patient. Patient escorted via wheelchair, and discharged home via private auto.  Denton Meek, RN

## 2022-03-12 NOTE — Evaluation (Signed)
ccupational Therapy Evaluation Patient Details Name: [name] MRN: [id number] DOB: May 06, 1961 Today's Date: 03/12/2022   History of Present Illness 61 y/o male presented to ED on 03/09/22 for possible UTI. Found to have sepsis 2/2 UTI. PMH: secondary progressive MS, spastic quadriparesis, hx of pseudomonas UTI.   Clinical Impression   Pt currently mod assist overall for self feeding and grooming tasks in supine with HOB elevated.  Will benefit from acute care OT to help increase LUE strength and functional use for greater independence with the above.  Recommend HHOT as well to continue progression back to his recent baseline.        Recommendations for follow up therapy are one component of a multi-disciplinary discharge planning process, led by the attending physician.  Recommendations may be updated based on patient status, additional functional criteria and insurance authorization.   Follow Up Recommendations  Home health OT    Assistance Recommended at Discharge Frequent or constant Supervision/Assistance  Patient can return home with the following Assistance with feeding;Assist for transportation;Two people to help with bathing/dressing/bathroom;Direct supervision/assist for medications management;Help with stairs or ramp for entrance    Functional Status Assessment  Patient has had a recent decline in their functional status and demonstrates the ability to make significant improvements in function in a reasonable and predictable amount of time.  Equipment Recommendations  None recommended by OT    Recommendations for Other Services       Precautions / Restrictions Precautions Precautions: Fall Restrictions Weight Bearing Restrictions: No      Mobility Bed Mobility Overal bed mobility: Needs Assistance Bed Mobility: Rolling Rolling: Total assist, +2 for physical assistance              Transfers Overall transfer level: Needs assistance Equipment used: None  (maximove) Transfers: Bed to chair/wheelchair/BSC               Transfer via Lift Equipment: Maximove        ADL either performed or assessed with clinical judgement   ADL Overall ADL's : Needs assistance/impaired Eating/Feeding: Moderate assistance;Bed level   Grooming: Moderate assistance;Bed level                               Functional mobility during ADLs: Wheelchair;Supervision/safety (power wheelchair able to operate joystick with the LUE for tilting and advancing) General ADL Comments: Pt's spouse and daughter present for session and able to assist with use of the maximove to his power wheelchair.  He was able to use his adaptive utensils and universal cuff for self feeding simulation with min to mod assist.  Educated both spouse and daughter on completion of LUE shoulder flexion exercises to help increase AROM for self feeding and grooming.  Will need to provide written handout and grip exercises next visit.  Recommend continued HHOT for further strengthening.     Vision Baseline Vision/History: 1 Wears glasses Ability to See in Adequate Light: 0 Adequate Patient Visual Report: No change from baseline Vision Assessment?:  (did not assess this session)     Perception Perception Perception: Within Functional Limits   Praxis Praxis Praxis: Intact    Pertinent Vitals/Pain Pain Assessment Pain Assessment: Faces Faces Pain Scale: Hurts a little bit Pain Location: generalized Pain Descriptors / Indicators: Grimacing, Discomfort Pain Intervention(s): Repositioned, Monitored during session     Hand Dominance Right (has learned to be L handed due to R UE weakness)   Extremity/Trunk Assessment  Upper Extremity Assessment Upper Extremity Assessment: RUE deficits/detail;LUE deficits/detail RUE Deficits / Details: Trace shoulder flexion with elbow flexion/extension at 2-/5,  Digit flexion AROM approximately 50%.  Has not used functionally for selfcare tasks  in a couple of years.  Uses RUE instead for self feeding and some grooming tasks. RUE Sensation: decreased light touch RUE Coordination: decreased fine motor;decreased gross motor LUE Deficits / Details: Shoulder flexion AROM 0-40 degrees with AAROM 0-110 degrees.  Elbow flexion/extension and grip 3+/5 overall.  Able to use larger handle utensils for self feeding with min assist and adaptive universal cup sleeve for drinking at the same level. LUE Sensation: decreased light touch LUE Coordination: decreased fine motor;decreased gross motor   Lower Extremity Assessment LLE Sensation: decreased light touch;decreased proprioception   Cervical / Trunk Assessment Cervical / Trunk Assessment: Kyphotic   Communication Communication Communication: Expressive difficulties   Cognition Arousal/Alertness: Awake/alert Behavior During Therapy: WFL for tasks assessed/performed Overall Cognitive Status: Within Functional Limits for tasks assessed                                                  Home Living Family/patient expects to be discharged to:: Private residence Living Arrangements: Spouse/significant other Available Help at Discharge: Family;Available 24 hours/day Type of Home: House Home Access: Level entry     Home Layout: One level     Bathroom Shower/Tub: Producer, television/film/video: Handicapped height     Home Equipment: Wheelchair - power;Other (comment);Hospital bed (ceiling lift in each room in house)   Additional Comments: built their house 5 years ago through the [place] and is handicap accessible.      Prior Functioning/Environment Prior Level of Function : Needs assist             Mobility Comments: hoyer lift for all mobility ADLs Comments: able to feed self and wash face with L UE but needs total assist for all other aspects of B/D        OT Problem List: Decreased strength;Decreased range of motion;Decreased activity  tolerance;Impaired sensation;Decreased knowledge of use of DME or AE;Impaired UE functional use      OT Treatment/Interventions: Self-care/ADL training;Therapeutic exercise;Patient/family education;Neuromuscular education;Balance training;Therapeutic activities;DME and/or AE instruction    OT Goals(Current goals can be found in the care plan section) Acute Rehab OT Goals Patient Stated Goal: Pt wants to get back to his baseline of feeding himself and completing some of his grooming tasks OT Goal Formulation: With patient Time For Goal Achievement: 03/19/22 Potential to Achieve Goals: Good  OT Frequency: Min 2X/week       AM-PAC OT "6 Clicks" Daily Activity     Outcome Measure Help from another person eating meals?: A Little Help from another person taking care of personal grooming?: A Little Help from another person toileting, which includes using toliet, bedpan, or urinal?: Total Help from another person bathing (including washing, rinsing, drying)?: Total Help from another person to put on and taking off regular upper body clothing?: Total Help from another person to put on and taking off regular lower body clothing?: Total 6 Click Score: 10   End of Session Equipment Utilized During Treatment: Other (comment) [employer]) Nurse Communication: Need for lift equipment  Activity Tolerance: Patient tolerated treatment well Patient left: in chair;with call bell/phone within reach  OT Visit Diagnosis: Muscle weakness (generalized) (M62.81);Feeding  difficulties (R63.3)                Time: 5102-5852 OT Time Calculation (min): 65 min Charges:  OT General Charges $OT Visit: 1 Visit OT Evaluation $OT Eval Moderate Complexity: 1 Mod OT Treatments $Self Care/Home Management : 38-52 mins Jackelynn Hosie OTR/L 03/12/2022, 2:10 PM

## 2022-03-14 LAB — CULTURE, BLOOD (ROUTINE X 2): Culture: NO GROWTH

## 2022-03-15 LAB — CULTURE, BLOOD (ROUTINE X 2)
Culture: NO GROWTH
Special Requests: ADEQUATE

## 2022-04-13 ENCOUNTER — Other Ambulatory Visit (HOSPITAL_COMMUNITY)
Admission: RE | Admit: 2022-04-13 | Discharge: 2022-04-13 | Disposition: A | Discharge status: Home / Self Care | Payer: Medicare Other | Source: Ambulatory Visit | Attending: Female Pelvic Medicine and Reconstructive Surgery | Admitting: Female Pelvic Medicine and Reconstructive Surgery

## 2022-04-13 ENCOUNTER — Other Ambulatory Visit: Payer: Self-pay

## 2022-04-13 ENCOUNTER — Emergency Department (HOSPITAL_BASED_OUTPATIENT_CLINIC_OR_DEPARTMENT_OTHER)
Admission: EM | Admit: 2022-04-13 | Discharge: 2022-04-13 | Disposition: A | Discharge status: Home / Self Care | Payer: No Typology Code available for payment source | Attending: Emergency Medicine | Admitting: Emergency Medicine

## 2022-04-13 ENCOUNTER — Encounter (HOSPITAL_BASED_OUTPATIENT_CLINIC_OR_DEPARTMENT_OTHER): Payer: Self-pay

## 2022-04-13 DIAGNOSIS — K0889 Other specified disorders of teeth and supporting structures: Secondary | ICD-10-CM | POA: Diagnosis not present

## 2022-04-13 DIAGNOSIS — Z466 Encounter for fitting and adjustment of urinary device: Secondary | ICD-10-CM

## 2022-04-13 DIAGNOSIS — N390 Urinary tract infection, site not specified: Secondary | ICD-10-CM | POA: Insufficient documentation

## 2022-04-13 MED ORDER — LOLLICAINE 20 % MT GEL: 1.0000 | Freq: Two times a day (BID) | OROMUCOSAL | 1 refills | Status: AC | PRN

## 2022-04-13 NOTE — Discharge Instructions (Signed)
Please follow-up with your dentist. Please use Tylenol and ibuprofen for pain as discussed below  Please use Tylenol or ibuprofen for pain.  You may use 600 mg ibuprofen every 6 hours or 1000 mg of Tylenol every 6 hours.  You may choose to alternate between the 2.  This would be most effective.  Not to exceed 4 g of Tylenol within 24 hours.  Not to exceed 3200 mg ibuprofen 24 hours.   Use the lidocaine gel that I prescribed.

## 2022-04-13 NOTE — ED Provider Notes (Signed)
[facility] EMERGENCY DEPT Provider Note   CSN: [id number] Arrival date & time: 04/13/22  1139     History  Chief Complaint  Patient presents with   Catheter Problem    [name] is a 61 y.o. male.  HPI Patient is a 61 year old male presenting emergency room today is a history of MS and is wheelchair bound uses a Foley catheter because of issues with ambulation.  Was post to have a catheter placed today by RN who comes out and helps him with this.  Nurse was unable to place catheter so he came to emergency room.  He denies any other symptoms apart from some right sided lower dental pain which has been ongoing for a few days.  He states that he chipped his tooth a long time ago but it only started hurting over the past 2 days.    Home Medications Prior to Admission medications   Medication Sig Start Date End Date Taking? Authorizing Provider  benzocaine (LOLLICAINE) 20 % GEL Use as directed 1 Application in the mouth or throat 2 (two) times daily as needed for up to 14 days. 04/13/22 04/27/22 Yes Godson Pollan, Rodrigo Ran, PA  acetaminophen (TYLENOL) 325 MG tablet Take 2 tablets (650 mg total) by mouth every 6 (six) hours as needed for mild pain, fever or moderate pain (or Fever >/= 101). 03/12/22   Azucena Fallen, MD  albuterol (VENTOLIN HFA) 108 (90 Base) MCG/ACT inhaler Inhale 2 puffs into the lungs every 6 (six) hours as needed. Patient taking differently: Inhale 1-2 puffs into the lungs every 6 (six) hours as needed for wheezing. 07/24/21   York Spaniel, MD  Ascorbic Acid (VITAMIN C) 100 MG tablet Take 100 mg by mouth daily.    [provider]  baclofen (LIORESAL) 10 MG tablet 1/2 tablet in the morning and at noon, and 2 tablets at night Patient taking differently: Take 20 mg by mouth daily as needed for muscle spasms. 05/20/16   York Spaniel, MD  Carboxymethylcellulose Sod PF (THERATEARS) 1 % GEL Place 1 drop into both eyes daily as needed (dry eyes).     [provider]  Carboxymethylcellulose Sodium (THERATEARS OP) Place 1 drop into both eyes daily as needed (dry eyes).    [provider]  cholecalciferol (VITAMIN D3) 25 MCG (1000 UNIT) tablet Take 1,000 Units by mouth daily.    [provider]  D-MANNOSE PO Take 1,300 mg by mouth See admin instructions. 1300 mg with cranberry extract for uti    [provider]  Dextromethorphan-Quinidine 20-10 MG CAPS Take 1 tablet by mouth 2 (two) times daily. Patient taking differently: Take 1 capsule by mouth daily. 05/27/17   York Spaniel, MD  escitalopram (LEXAPRO) 10 MG tablet Take 10 mg by mouth daily.    [provider]  gabapentin (NEURONTIN) 100 MG capsule Take 1 capsule (100 mg total) by mouth at bedtime. Patient taking differently: Take 100 mg by mouth daily as needed (pain). 01/20/21   York Spaniel, MD  ibuprofen (ADVIL) 200 MG tablet Take 600 mg by mouth every 6 (six) hours as needed for headache or moderate pain.    [provider]  liver oil-zinc oxide (DESITIN) 40 % ointment Apply 1 application. topically 3 (three) times daily. 03/12/22   Azucena Fallen, MD  methenamine (HIPREX) 1 g tablet Take 1 g by mouth 2 (two) times daily with a meal.    [provider]  methocarbamol (ROBAXIN)  500 MG tablet Take 1 tablet (500 mg total) by mouth 3 (three) times daily. Patient taking differently: Take 500 mg by mouth daily as needed for muscle spasms. 01/05/22   Sater, Pearletha Furl, MD  modafinil (PROVIGIL) 100 MG tablet TAKE 1 TABLET (100 MG TOTAL) BY MOUTH IN THE MORNING. *SENT FOR PA* Patient taking differently: Take 100 mg by mouth daily. 11/29/20   York Spaniel, MD  ocrelizumab 600 mg in sodium chloride 0.9 % 500 mL Inject 600 mg into the vein every 6 (six) months.     [provider]  polyethylene glycol (MIRALAX / GLYCOLAX) 17 g packet Take 17 g by mouth daily. 03/13/22   Azucena Fallen, MD      Allergies     Patient has no known allergies.    Review of Systems   Review of Systems  Physical Exam Updated Vital Signs BP 109/72 (BP Location: Left Arm)   Pulse 61   Temp 98.3 F (36.8 C)   Resp 16   Ht 5\' 10"  (1.778 m)   Wt 102.3 kg   SpO2 99%   BMI 32.36 kg/m  Physical Exam Vitals and nursing note reviewed.  Constitutional:      General: He is not in acute distress.    Appearance: Normal appearance. He is not ill-appearing.     Comments: 61 year old male in wheelchair.  Pleasant, well-appearing, in no acute distress.  HENT:     Head: Normocephalic and atraumatic.     Mouth/Throat:     Comments: Right lower molar with chip.  No exposed pulp but there is some dentin exposed.  No evidence of gumline swelling or abscess Eyes:     General: No scleral icterus.       Right eye: No discharge.        Left eye: No discharge.     Conjunctiva/sclera: Conjunctivae normal.  Pulmonary:     Effort: Pulmonary effort is normal.     Breath sounds: No stridor.  Neurological:     Mental Status: He is alert and oriented to person, place, and time. Mental status is at baseline.     ED Results / Procedures / Treatments   Labs (all labs ordered are listed, but only abnormal results are displayed) Labs Reviewed - No data to display  EKG None  Radiology No results found.  Procedures Procedures    Medications Ordered in ED Medications - No data to display  ED Course/ Medical Decision Making/ A&P                           Medical Decision Making Risk OTC drugs.   Patient is a 61 year old male presenting emergency room today is a history of MS and is wheelchair bound uses a Foley catheter because of issues with ambulation.  Was post to have a catheter placed today by RN who comes out and helps him with this.  Nurse was unable to place catheter so he came to emergency room.  He denies any other symptoms apart from some right sided lower dental pain which has been ongoing for a few days.   He states that he chipped his tooth a long time ago but it only started hurting over the past 2 days.  16 French Foley was placed by bedside RN with no resistance.  Spontaneously urine drained into bag attached to patient's leg.  He has no symptoms today apart from some dental pain  This is likely secondary to a dental fracture that he suffered there is no exposed pulp.  Dentin is exposed however.  This is likely causing his pain.  Prescribed lidocaine gel for him to use at home and he understands importance of following up with dentistry.  All questions answered best my ability.  Will discharge home  Final Clinical Impression(s) / ED Diagnoses Final diagnoses:  Encounter for ureteral catheter placement  Pain, dental    Rx / DC Orders ED Discharge Orders          Ordered    benzocaine (LOLLICAINE) 20 % GEL  2 times daily PRN        04/13/22 1405              Gailen Shelter, [place] 04/13/22 1958    Derwood Kaplan, MD 04/16/22 [phone]

## 2022-04-13 NOTE — ED Triage Notes (Signed)
Patient here POV from Home.  Endorses having 73 French Foley Catheter removed today as it was Due for Changing. Home Health Provider was unable to insert Foley Catheter and met Resistance and Blood upon Insertion. Removed at 1100  NAD Noted during Triage. A&Ox4. Gcs 15. BIB Architectural technologist.

## 2022-04-13 NOTE — ED Notes (Signed)
16 french foley placed with no resistance. Drained 175 cc clear yellow urine. Leg bag placed  per pts request. Foley care reviewed .Dc instructions reviewed with patient. Patient voiced understanding. Dc with belongings.

## 2022-04-14 LAB — URINALYSIS, ROUTINE W REFLEX MICROSCOPIC
Bacteria, UA: NONE SEEN
Bilirubin Urine: NEGATIVE
Glucose, UA: NEGATIVE mg/dL
Ketones, ur: 5 mg/dL — AB
Nitrite: NEGATIVE
Protein, ur: 30 mg/dL — AB
RBC / HPF: >50 RBC/hpf — ABNORMAL HIGH (ref 0–5)
Specific Gravity, Urine: 1.018 (ref 1.005–1.030)
pH: 7 (ref 5.0–8.0)

## 2022-04-17 LAB — URINE CULTURE: Culture: >=100000 — AB

## 2022-04-21 ENCOUNTER — Other Ambulatory Visit: Payer: Self-pay | Admitting: Neurology

## 2022-07-01 ENCOUNTER — Ambulatory Visit (INDEPENDENT_AMBULATORY_CARE_PROVIDER_SITE_OTHER): Payer: Medicare Other | Admitting: Neurology

## 2022-07-01 ENCOUNTER — Encounter: Payer: Self-pay | Admitting: Neurology

## 2022-07-01 VITALS — BP 109/66 | HR 63 | Ht 70.0 in | Wt 209.0 lb

## 2022-07-01 DIAGNOSIS — G500 Trigeminal neuralgia: Secondary | ICD-10-CM | POA: Diagnosis not present

## 2022-07-01 DIAGNOSIS — G8250 Quadriplegia, unspecified: Secondary | ICD-10-CM

## 2022-07-01 DIAGNOSIS — G35 Multiple sclerosis: Secondary | ICD-10-CM | POA: Diagnosis not present

## 2022-07-01 DIAGNOSIS — N319 Neuromuscular dysfunction of bladder, unspecified: Secondary | ICD-10-CM

## 2022-07-01 DIAGNOSIS — Z79899 Other long term (current) drug therapy: Secondary | ICD-10-CM | POA: Diagnosis not present

## 2022-07-01 MED ORDER — LAMOTRIGINE 100 MG PO TABS: ORAL_TABLET | ORAL | 5 refills | Status: DC

## 2022-07-01 NOTE — Progress Notes (Signed)
[facility]  PATIENT: [name] DOB: 08/31/1961  REFERRING DOCTOR OR PCP: [name], MD SOURCE: Patient, notes from Dr. Anne Hahn, imaging and lab reports, MRI images personally reviewed.  _________________________________   HISTORICAL  CHIEF COMPLAINT:  Chief Complaint  Patient presents with   Follow-up    Pt in room #6 with his wife. Pt here today for f/u.    HISTORY OF PRESENT ILLNESS:  [name] is a 61 y.o. man with a relapsing form of secondary progressive MS.  Update 07/01/2022:  Starting 04/13/2022, he had the onset of lower right tooth pain and jaw/cheek pain 04/13/2022.   At first, they thought the pain was due to a chipped tooth.  But  there was apparently no involvement of the nerve root when he saw denistry (saw 2).   He did have a  dental procedure  In July, when pain started it seemed to involve the entire right side but now is from eye to the jaw but the cheek is the worse.   Initially he took Tylenol and Advil but they have not helped much.  Pain involves trains of jolts into the region.     He spoke to his neurologist at the [facility] at the [place] and was started on Tegretol.    He is on 200 mg two pills po bid with.  I took another look at the MRI of the brain from 2020.  Although he does have a focus in the pons, it is towards the left and would not explain symptoms.  I do not see any abnormal findings involving the dorsal root entry zone or the proximal trigeminal nerve on the right.  There are no adjacent blood vessels.  He has no definite exacerbation but has slow progression.  He is on Ocrevus as his disease modifying therapy and tolerates it well.  He has not had any exacerbations since starting it around 2018 or 2019   He is wheelchair bound.    He last used a cane in 2009 and then [place] crutches.  After a bad fall that year, he started to use a wheelchair.  He was able to transfer independently more years and then transfer with  assistance.   He has needed a lift to transfer since a long hospital stay for a pseudomonas UTI (urosepsis) May 2021.     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    He is on baclofen 10 mg 2-3 times a day.  Tizanidine caused hallucinations.  He has numbness waist down.  He takes gabapentin with benefit for dysesthesias.   He has a Foley catheter changed every month.  He is colonized and is on methenamine and Vit C.   If getting weaker or cognitive changes he takes Cipro or Amoxicillin and gets a urine culture done for sensitivity.       He has a strong FH of MS with two sisters, one niece.   [place] people with [place].     MS History: He was diagnosed in 2007 after presenting with gradual worsening of balance.  Around that time, he had hurt his knee but even when knee improved, he flet off balance and would hold the wall or furniture at time.   In retrospect, he had some symptoms in 2001.  At that me an MRI was reportedly normal.  That year, he had fairly sudden onset of numbness below his waist that gradually  improved over the next few weeks.     He was in a trial comparing Rebif and Betaseron n 2007 and was initially on Rebif and then changed to Betaseron.   He had injection site reactions.   He started taking GIlenya in 2011.    He switched to Ocrevus in 2019.     IMAGING: MRI of the head 07/11/2019 shows T2 hyperintense foci within the left posterior medulla, cerebellar hemispheres, lower left central pons, right midbrain, left thalamus, and in the periventricular, juxtacortical and deep white matter of both hemispheres.  No definite changes compared to the MRI from 08/01/2014.  MRI of the brain 08/01/2014 shows T2 hyperintense foci within the left posterior medulla, cerebellar hemispheres, lower left central pons, right midbrain, left thalamus, and in the periventricular, juxtacortical and deep white matter of both hemispheres.  No definite changes  compared to the MRI from 08/01/2014.  The infratentorial lesions were not clearly present on the previous MRI from 2007 and there has also been some progression in the hemispheres.Marland Kitchen  MRI of the brain 01/11/2006 shows T2/FLAIR hyperintense foci in the hemispheres consistent with MS  MRI cervical spine 07/11/2019 shows foci seen on the previous MRI as well as a focus at T2-T3.  MRI of the cervical spine 01/10/2006 shows T2 hyperintense foci within the spinal cord Adjacent to C5, centrally towards the left adjacent to T1 and at T2-T3  REVIEW OF SYSTEMS: Constitutional: No fevers, chills, sweats, or change in appetite Eyes: No visual changes, double vision, eye pain Ear, nose and throat: No hearing loss, ear pain, nasal congestion, sore throat Cardiovascular: No chest pain, palpitations Respiratory:  No shortness of breath at rest or with exertion.   No wheezes GastrointestinaI: No nausea, vomiting, diarrhea, abdominal pain, fecal incontinence Genitourinary:  No dysuria, urinary retention or frequency.  No nocturia. Musculoskeletal:  No neck pain, back pain Integumentary: No rash, pruritus, skin lesions Neurological: as above Psychiatric: No depression at this time.  No anxiety Endocrine: No palpitations, diaphoresis, change in appetite, change in weigh or increased thirst Hematologic/Lymphatic:  No anemia, purpura, petechiae. Allergic/Immunologic: No itchy/runny eyes, nasal congestion, recent allergic reactions, rashes  ALLERGIES: No Known Allergies  HOME MEDICATIONS:  Current Outpatient Medications:    lamoTRIgine (LAMICTAL) 100 MG tablet, Take 1/2 po qd x 3 days, then 1/2 po bid x 3 days, then 1/2 po tid, then 1 po bid, Disp: 60 tablet, Rfl: 5   acetaminophen (TYLENOL) 325 MG tablet, Take 2 tablets (650 mg total) by mouth every 6 (six) hours as needed for mild pain, fever or moderate pain (or Fever >/= 101)., Disp: 30 tablet, Rfl: 0   albuterol (VENTOLIN HFA) 108 (90 Base) MCG/ACT inhaler,  Inhale 2 puffs into the lungs every 6 (six) hours as needed. (Patient taking differently: Inhale 1-2 puffs into the lungs every 6 (six) hours as needed for wheezing.), Disp: 1 each, Rfl: 3   Ascorbic Acid (VITAMIN C) 100 MG tablet, Take 100 mg by mouth daily., Disp: , Rfl:    baclofen (LIORESAL) 10 MG tablet, 1/2 tablet in the morning and at noon, and 2 tablets at night (Patient taking differently: Take 20 mg by mouth daily as needed for muscle spasms.), Disp: 270 each, Rfl: 3   Carboxymethylcellulose Sod PF (THERATEARS) 1 % GEL, Place 1 drop into both eyes daily as needed (dry eyes)., Disp: , Rfl:    Carboxymethylcellulose Sodium (THERATEARS OP), Place 1 drop into both eyes daily as needed (dry eyes)., Disp: , Rfl:  cholecalciferol (VITAMIN D3) 25 MCG (1000 UNIT) tablet, Take 1,000 Units by mouth daily., Disp: , Rfl:    D-MANNOSE PO, Take 1,300 mg by mouth See admin instructions. 1300 mg with cranberry extract for uti, Disp: , Rfl:    Dextromethorphan-Quinidine 20-10 MG CAPS, Take 1 tablet by mouth 2 (two) times daily. (Patient taking differently: Take 1 capsule by mouth daily.), Disp: 60 capsule, Rfl: 3   escitalopram (LEXAPRO) 10 MG tablet, Take 10 mg by mouth daily., Disp: , Rfl:    gabapentin (NEURONTIN) 100 MG capsule, Take 1 capsule (100 mg total) by mouth at bedtime. (Patient taking differently: Take 100 mg by mouth daily as needed (pain).), Disp: 90 capsule, Rfl: 3   ibuprofen (ADVIL) 200 MG tablet, Take 600 mg by mouth every 6 (six) hours as needed for headache or moderate pain., Disp: , Rfl:    methenamine (HIPREX) 1 g tablet, Take 1 g by mouth 2 (two) times daily with a meal., Disp: , Rfl:    methocarbamol (ROBAXIN) 500 MG tablet, TAKE 1 TABLET BY MOUTH THREE TIMES A DAY, Disp: 30 tablet, Rfl: 0   modafinil (PROVIGIL) 100 MG tablet, TAKE 1 TABLET (100 MG TOTAL) BY MOUTH IN THE MORNING. *SENT FOR PA* (Patient taking differently: Take 100 mg by mouth daily.), Disp: 90 tablet, Rfl: 1    ocrelizumab 600 mg in sodium chloride 0.9 % 500 mL, Inject 600 mg into the vein every 6 (six) months. 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    PAST SURGICAL HISTORY: Past Surgical History:  Procedure Laterality Date   eye surgeries     x 2; bilateral 74 and 76    FAMILY HISTORY: Family History  Problem Relation Age of Onset   Cancer Father    Multiple sclerosis Sister    Seizures Maternal Uncle    Parkinsonism Maternal Uncle    Multiple sclerosis Sister    Multiple sclerosis Paternal Uncle    Multiple sclerosis Other    Lung cancer Other        parent   Uterine cancer Other        other    SOCIAL HISTORY:  Social History   Socioeconomic History   Marital status: Married    Spouse name: joy   Number of children: 2   Years of education: college   Highest education level: Not on file  Occupational History   Occupation: disabled  Tobacco Use   Smoking status: Former   Smokeless tobacco: Never  Building services engineer Use: Not on file  Substance and Sexual Activity   Alcohol use: No    Comment: rare    Drug use: No   Sexual activity: Not on file  Other Topics Concern   Not on file  Social History Narrative   Lives at home, married   Patient is right handed.   Patient drinks 1 cup caffeine daily.   Social Determinants of Health   Financial Resource Strain: Not on file  Food Insecurity: Not on file  Transportation Needs: No Transportation Needs (07/04/2020)   PRAPARE - Transportation    Lack of Transportation (Medical): No    Lack  of Transportation (Non-Medical): No  Physical Activity: Not on file  Stress: Not on file  Social Connections: Not on file  Intimate Partner Violence: Not on file      PHYSICAL EXAM  Vitals:   07/01/22 1535  BP: 109/66  Pulse: 63  Weight: 209 lb (94.8 kg)  Height:  (1.778 m)    Body mass index is 29.99 kg/m.   General: The patient is well-developed and well-nourished and in no acute distress  HEENT:  Head is [place]/AT.  Sclera are anicteric.    Skin: Extremities are without rash or  edema.  Musculoskeletal:  Back is nontender  Neurologic Exam  Mental status: The patient is alert and oriented x 3 at the time of the examination. The patient has apparent normal recent and remote memory, with an apparently normal attention span and concentration ability.   Speech is normal.  Cranial nerves: Extraocular movements show an INO right greater than left and also some nystagmus on upgaze.  Reduced visual acuity OD.  Facial symmetry is present.  Facial strength appears symmetric.  However, he has allodynia with altered sensation in the V2 distribution more than the V3 distribution and to a lesser extent in the V1 distribution.  Trapezius and sternocleidomastoid strength is normal.  Mild dysarthria is noted.  The tongue is midline, and the patient has symmetric elevation of the soft palate. No obvious hearing deficits are noted.  Motor:  Muscle bulk is normal.   Tone is increased legs > arms; right > left . Strength is  4+/5 in left arm, 2 to 3/5 right arm, 0/5 in legs.   Sensory: He is unable to feel touch or vibration in the legs.  Gait and station: Wheelchair bound  Reflexes: Deep tendon reflexes are symmetric and increased in legs with sustained ankle clonus.   Plantar responses are extensor .    DIAGNOSTIC DATA (LABS, IMAGING, TESTING) - I reviewed patient records, labs, notes, testing and imaging myself where available.  Lab Results  Component Value Date   WBC 7.7 03/12/2022   HGB 13.6 03/12/2022   HCT 41.3 03/12/2022   MCV 92.6 03/12/2022   PLT 155 03/12/2022      Component Value Date/Time   NA 138 03/12/2022 0306   NA 141  01/26/2022 1207   K 3.9 03/12/2022 0306   CL 112 (H) 03/12/2022 0306   CO2 18 (L) 03/12/2022 0306   GLUCOSE 84 03/12/2022 0306   BUN 14 03/12/2022 0306   BUN 13 01/26/2022 1207   CREATININE 0.94 03/12/2022 0306   CALCIUM 8.5 (L) 03/12/2022 0306   PROT 6.5 03/09/2022 1935   PROT 6.6 01/26/2022 1207   ALBUMIN 4.2 03/09/2022 1935   ALBUMIN 4.6 01/26/2022 1207   AST 17 03/09/2022 1935   ALT 19 03/09/2022 1935   ALKPHOS 120 03/09/2022 1935   BILITOT 0.5 03/09/2022 1935   BILITOT 0.5 01/26/2022 1207   GFRNONAA >60 03/12/2022 0306   GFRAA 84 07/29/2020 1429   Lab Results  Component Value Date   CHOL 234 (H) 08/14/2015   HDL 47.80 08/14/2015   LDLCALC 163 (H) 08/14/2015   LDLDIRECT 192.1 08/04/2012   TRIG 116.0 08/14/2015   CHOLHDL 5 08/14/2015   Lab Results  Component Value Date   HGBA1C 5.4 08/14/2015   No results found for: "VITAMINB12" Lab Results  Component Value Date   TSH 0.82 07/19/2018       ASSESSMENT AND PLAN  Multiple sclerosis, secondary progressive (HCC) - Plan: Ambulatory  referral to Pain Clinic, MR BRAIN/IAC W WO CONTRAST  Right trigeminal neuralgia - Plan: Ambulatory referral to Pain Clinic, MR BRAIN/IAC W WO CONTRAST  Spastic quadriparesis (HCC)  High risk medication use  Neurogenic bladder   He has trigeminal neuralgia that has not responded to gabapentin and carbamazepine.  A definite source of his pain is not seen on the MRI (no MS lesion at the dorsal root entry zone and no adjacent blood vessel to the dorsal root entry zone or proximal nerve).   I will add lamotrigine and titrate up to 100 mg p.o. twice daily.  Additionally we will check an MRI of the brain (last 1 was from 2020) to see if there are any demyelinating plaques which would be consistent with breakthrough activity. Referred to anesthesiology pain ([facility]) for trigeminal neuralgia procedure due to the intensity of his pain They will return to see me in 2 months or  sooner if there are new or worsening neurologic symptoms.   Izaiyah Kleinman A. Felecia Shelling, MD, [facility] 6/76/7209, 4:70 PM Certified in Neurology, Clinical Neurophysiology, Sleep Medicine and Neuroimaging  [facility] [address] [place],  [address] [phone]

## 2022-07-01 NOTE — Patient Instructions (Signed)
The pharmacy has the prescription for lamotrigine 100 mg tablets. For 3 days, just take one half pill a day. For the next 3 days, take one half pill twice a day. For the next 3 days, take one half pill 3 times a day Then start taking one pill twice a day from this point on.    If you get a rash, need to stop the medication and not take it again.

## 2022-07-02 ENCOUNTER — Telehealth: Payer: Self-pay | Admitting: Neurology

## 2022-07-02 NOTE — Telephone Encounter (Signed)
medicare NPR sent to GI  

## 2022-07-03 NOTE — Telephone Encounter (Signed)
Pt's wife, [name] need referral for MRI sent to [facility] because they have a lift to get him in and out of the wheelchair.  [facility] said could not accommodate him, need to have done at the hospital.

## 2022-07-06 NOTE — Telephone Encounter (Signed)
I sent the MRI order to [facility] for them to call the patient and schedule. [phone]

## 2022-07-07 ENCOUNTER — Emergency Department (HOSPITAL_COMMUNITY): Payer: No Typology Code available for payment source

## 2022-07-07 ENCOUNTER — Telehealth: Payer: Self-pay | Admitting: Neurology

## 2022-07-07 ENCOUNTER — Encounter (HOSPITAL_COMMUNITY): Payer: Self-pay

## 2022-07-07 ENCOUNTER — Emergency Department (HOSPITAL_COMMUNITY)
Admission: EM | Admit: 2022-07-07 | Discharge: 2022-07-07 | Disposition: A | Discharge status: Home / Self Care | Payer: No Typology Code available for payment source | Attending: Emergency Medicine | Admitting: Emergency Medicine

## 2022-07-07 DIAGNOSIS — R531 Weakness: Secondary | ICD-10-CM | POA: Insufficient documentation

## 2022-07-07 DIAGNOSIS — N390 Urinary tract infection, site not specified: Secondary | ICD-10-CM | POA: Diagnosis present

## 2022-07-07 DIAGNOSIS — N3001 Acute cystitis with hematuria: Secondary | ICD-10-CM | POA: Diagnosis not present

## 2022-07-07 LAB — COMPREHENSIVE METABOLIC PANEL
ALT: 12 U/L (ref 0–44)
AST: 12 U/L — ABNORMAL LOW (ref 15–41)
Albumin: 4.5 g/dL (ref 3.5–5.0)
Alkaline Phosphatase: 101 U/L (ref 38–126)
Anion gap: 12 (ref 5–15)
BUN: 20 mg/dL (ref 6–20)
CO2: 22 mmol/L (ref 22–32)
Calcium: 9.3 mg/dL (ref 8.9–10.3)
Chloride: 105 mmol/L (ref 98–111)
Creatinine, Ser: 0.82 mg/dL (ref 0.61–1.24)
GFR, Estimated: >60 mL/min (ref >60)
Glucose, Bld: 78 mg/dL (ref 70–99)
Potassium: 3.8 mmol/L (ref 3.5–5.1)
Sodium: 139 mmol/L (ref 135–145)
Total Bilirubin: 1 mg/dL (ref 0.3–1.2)
Total Protein: 7.6 g/dL (ref 6.5–8.1)

## 2022-07-07 LAB — URINALYSIS, MICROSCOPIC (REFLEX): RBC / HPF: >50 RBC/hpf (ref 0–5)

## 2022-07-07 LAB — CBC WITH DIFFERENTIAL/PLATELET
Abs Immature Granulocytes: 0.03 10*3/uL (ref 0.00–0.07)
Basophils Absolute: 0 10*3/uL (ref 0.0–0.1)
Basophils Relative: 1 %
Eosinophils Absolute: 0.2 10*3/uL (ref 0.0–0.5)
Eosinophils Relative: 2 %
HCT: 46.1 % (ref 39.0–52.0)
Hemoglobin: 15.1 g/dL (ref 13.0–17.0)
Immature Granulocytes: 0 %
Lymphocytes Relative: 7 %
Lymphs Abs: 0.5 10*3/uL — ABNORMAL LOW (ref 0.7–4.0)
MCH: 31.5 pg (ref 26.0–34.0)
MCHC: 32.8 g/dL (ref 30.0–36.0)
MCV: 96.2 fL (ref 80.0–100.0)
Monocytes Absolute: 0.8 10*3/uL (ref 0.1–1.0)
Monocytes Relative: 11 %
Neutro Abs: 6.3 10*3/uL (ref 1.7–7.7)
Neutrophils Relative %: 79 %
Platelets: 238 10*3/uL (ref 150–400)
RBC: 4.79 MIL/uL (ref 4.22–5.81)
RDW: 14.6 % (ref 11.5–15.5)
WBC: 7.9 10*3/uL (ref 4.0–10.5)
nRBC: 0 % (ref 0.0–0.2)

## 2022-07-07 LAB — URINALYSIS, ROUTINE W REFLEX MICROSCOPIC
Glucose, UA: NEGATIVE mg/dL
Ketones, ur: >80 mg/dL — AB
Nitrite: NEGATIVE
Protein, ur: 100 mg/dL — AB
Specific Gravity, Urine: >1.03 — ABNORMAL HIGH (ref 1.005–1.030)
pH: 6 (ref 5.0–8.0)

## 2022-07-07 LAB — LIPASE, BLOOD: Lipase: 27 U/L (ref 11–51)

## 2022-07-07 LAB — LACTIC ACID, PLASMA: Lactic Acid, Venous: 1.3 mmol/L (ref 0.5–1.9)

## 2022-07-07 MED ORDER — SULFAMETHOXAZOLE-TRIMETHOPRIM 800-160 MG PO TABS: 1.0000 | ORAL_TABLET | Freq: Two times a day (BID) | ORAL | 0 refills | Status: AC

## 2022-07-07 MED ORDER — SULFAMETHOXAZOLE-TRIMETHOPRIM 800-160 MG PO TABS: 1.0000 | ORAL_TABLET | Freq: Two times a day (BID) | ORAL | 0 refills | Status: DC

## 2022-07-07 MED ORDER — ACETAMINOPHEN 325 MG PO TABS
650.0000 mg | ORAL_TABLET | Freq: Once | ORAL | Status: AC
  Administered 2022-07-07: 650 mg via ORAL
  Filled 2022-07-07: qty 2

## 2022-07-07 MED ORDER — PIPERACILLIN-TAZOBACTAM 3.375 G IVPB
3.3750 g | Freq: Once | INTRAVENOUS | Status: DC
  Filled 2022-07-07: qty 50

## 2022-07-07 MED ORDER — SODIUM CHLORIDE 0.9 % IV BOLUS
1000.0000 mL | Freq: Once | INTRAVENOUS | Status: AC
  Administered 2022-07-07: 1000 mL via INTRAVENOUS

## 2022-07-07 MED ORDER — PIPERACILLIN-TAZOBACTAM 3.375 G IVPB 30 MIN
3.3750 g | Freq: Once | INTRAVENOUS | Status: AC
  Administered 2022-07-07: 3.375 g via INTRAVENOUS

## 2022-07-07 MED ORDER — GADOPICLENOL 0.5 MMOL/ML IV SOLN
9.5000 mL | Freq: Once | INTRAVENOUS | Status: AC | PRN
  Administered 2022-07-07: 9.5 mL via INTRAVENOUS

## 2022-07-07 NOTE — ED Notes (Signed)
Pt's foley cath removed and new one placed.

## 2022-07-07 NOTE — ED Triage Notes (Signed)
Pt arrive via EMS, from home, aox4, c/o generalized wkn, dark cloudy urine in cath.

## 2022-07-07 NOTE — ED Notes (Signed)
Pt provided discharge instructions and prescription information. Pt was given the opportunity to ask questions and questions were answered.   

## 2022-07-07 NOTE — ED Provider Notes (Signed)
[facility]-EMERGENCY DEPT Provider Note   CSN: [id number] Arrival date & time: 07/07/22  1225    History  Weakness, UTI   [name] is a 61 y.o. male with history of MS, wheelchair-bound, neurogenic bladder requiring indwelling Foley catheter here for evaluation of possible UTI.  Wife noted that patient began having dark urine output from his Foley catheter.  Home health nurse came to change his Foley catheter and noted to be dark recommended here for evaluation.  States patient has left functional upper extremity however otherwise does not move extremities at baseline.  States he has been weak, decreased ability to help feed himself at home.  Wife thought this is due to a UTI symptom onset 3 days ago and started giving him Cipro, 500 twice daily over the last 3 days.  She does not feel like this has been helping.  He is eating and drinking normally however thinks he is dehydrated.  No fevers at home.  Normal mentation.  Of note patient has been treated for trigeminal neuralgia.  Has MRI scheduled for tomorrow for further work-up of this.  HPI     Home Medications Prior to Admission medications   Medication Sig Start Date End Date Taking? Authorizing Provider  sulfamethoxazole-trimethoprim (BACTRIM DS) 800-160 MG tablet Take 1 tablet by mouth 2 (two) times daily for 7 days. 07/07/22 07/14/22 Yes Catie Chiao A, PA-C  acetaminophen (TYLENOL) 325 MG tablet Take 2 tablets (650 mg total) by mouth every 6 (six) hours as needed for mild pain, fever or moderate pain (or Fever >/= 101). 03/12/22   Azucena Fallen, MD  albuterol (VENTOLIN HFA) 108 (90 Base) MCG/ACT inhaler Inhale 2 puffs into the lungs every 6 (six) hours as needed. Patient taking differently: Inhale 1-2 puffs into the lungs every 6 (six) hours as needed for wheezing. 07/24/21   York Spaniel, MD  Ascorbic Acid (VITAMIN C) 100 MG tablet Take 100 mg by mouth daily.    [provider]   baclofen (LIORESAL) 10 MG tablet 1/2 tablet in the morning and at noon, and 2 tablets at night Patient taking differently: Take 20 mg by mouth daily as needed for muscle spasms. 05/20/16   York Spaniel, MD  Carboxymethylcellulose Sod PF (THERATEARS) 1 % GEL Place 1 drop into both eyes daily as needed (dry eyes).    [provider]  Carboxymethylcellulose Sodium (THERATEARS OP) Place 1 drop into both eyes daily as needed (dry eyes).    [provider]  cholecalciferol (VITAMIN D3) 25 MCG (1000 UNIT) tablet Take 1,000 Units by mouth daily.    [provider]  D-MANNOSE PO Take 1,300 mg by mouth See admin instructions. 1300 mg with cranberry extract for uti    [provider]  Dextromethorphan-Quinidine 20-10 MG CAPS Take 1 tablet by mouth 2 (two) times daily. Patient taking differently: Take 1 capsule by mouth daily. 05/27/17   York Spaniel, MD  escitalopram (LEXAPRO) 10 MG tablet Take 10 mg by mouth daily.    [provider]  gabapentin (NEURONTIN) 100 MG capsule Take 1 capsule (100 mg total) by mouth at bedtime. Patient taking differently: Take 100 mg by mouth daily as needed (pain). 01/20/21   York Spaniel, MD  ibuprofen (ADVIL) 200 MG tablet Take 600 mg by mouth every 6 (six) hours as needed for headache or moderate pain.    [provider]  lamoTRIgine (LAMICTAL) 100 MG tablet Take 1/2 po qd x  3 days, then 1/2 po bid x 3 days, then 1/2 po tid, then 1 po bid 07/01/22   Sater, Nanine Means, MD  methenamine (HIPREX) 1 g tablet Take 1 g by mouth 2 (two) times daily with a meal.    [provider]  methocarbamol (ROBAXIN) 500 MG tablet TAKE 1 TABLET BY MOUTH THREE TIMES A DAY 04/21/22   Sater, Richard A, MD  modafinil (PROVIGIL) 100 MG tablet TAKE 1 TABLET (100 MG TOTAL) BY MOUTH IN THE MORNING. *SENT FOR PA* Patient taking differently: Take 100 mg by mouth daily. 11/29/20   Kathrynn Ducking, MD  ocrelizumab 600 mg in sodium  chloride 0.9 % 500 mL Inject 600 mg into the vein every 6 (six) months.     [provider]      Allergies    Patient has no known allergies.    Review of Systems   Review of Systems  Constitutional:  Positive for fatigue.  HENT: Negative.    Respiratory: Negative.    Cardiovascular: Negative.   Gastrointestinal: Negative.   Genitourinary: Negative.   Musculoskeletal: Negative.   Skin: Negative.   Neurological: Negative.   All other systems reviewed and are negative.  Physical Exam Updated Vital Signs BP 118/71   Pulse 70   Temp 97.6 F (36.4 C) (Oral)   Resp 16   SpO2 97%  Physical Exam Vitals and nursing note reviewed.  Constitutional:      General: He is not in acute distress.    Appearance: He is well-developed. He is not ill-appearing, toxic-appearing or diaphoretic.  HENT:     Head: Normocephalic and atraumatic.     Nose: Nose normal.     Mouth/Throat:     Mouth: Mucous membranes are moist.  Eyes:     Pupils: Pupils are equal, round, and reactive to light.  Cardiovascular:     Rate and Rhythm: Normal rate and regular rhythm.     Pulses: Normal pulses.     Heart sounds: Normal heart sounds.  Pulmonary:     Effort: Pulmonary effort is normal. No respiratory distress.     Breath sounds: Normal breath sounds.  Abdominal:     General: Bowel sounds are normal. There is no distension.     Palpations: Abdomen is soft.     Tenderness: There is no abdominal tenderness. There is no right CVA tenderness, left CVA tenderness or guarding.  Genitourinary:    Comments: Foley in place Musculoskeletal:        General: Normal range of motion.     Cervical back: Normal range of motion and neck supple.  Skin:    General: Skin is warm and dry.     Capillary Refill: Capillary refill takes less than 2 seconds.     Comments: Pressure wounds  Neurological:     Mental Status: He is alert.     Motor: Weakness and atrophy present.     Comments: Chronic weakness,  atrophy LE    ED Results / Procedures / Treatments   Labs (all labs ordered are listed, but only abnormal results are displayed) Labs Reviewed  CBC WITH DIFFERENTIAL/PLATELET - Abnormal; Notable for the following components:      Result Value   Lymphs Abs 0.5 (*)    All other components within normal limits  COMPREHENSIVE METABOLIC PANEL - Abnormal; Notable for the following components:   AST 12 (*)    All other components within normal limits  URINALYSIS, ROUTINE W REFLEX MICROSCOPIC -  Abnormal; Notable for the following components:   Color, Urine RED (*)    APPearance HAZY (*)    Specific Gravity, Urine >1.030 (*)    Hgb urine dipstick LARGE (*)    Bilirubin Urine SMALL (*)    Ketones, ur >80 (*)    Protein, ur 100 (*)    Leukocytes,Ua SMALL (*)    All other components within normal limits  URINALYSIS, MICROSCOPIC (REFLEX) - Abnormal; Notable for the following components:   Bacteria, UA RARE (*)    All other components within normal limits  URINE CULTURE  CULTURE, BLOOD (ROUTINE X 2)  CULTURE, BLOOD (ROUTINE X 2)  LIPASE, BLOOD  LACTIC ACID, PLASMA  LACTIC ACID, PLASMA    EKG None  Radiology MR Brain W and Wo Contrast  Result Date: 07/07/2022 CLINICAL DATA:  Demyelinating disease. Sensory abnormality trigeminal region. EXAM: MRI HEAD WITHOUT AND WITH CONTRAST TECHNIQUE: Multiplanar, multiecho pulse sequences of the brain and surrounding structures were obtained without and with intravenous contrast. CONTRAST:  9.5 mL Vueway IV COMPARISON:  MRI head 07/11/2019 FINDINGS: Brain: Multiple periventricular white matter hyperintensities stable. Hyperintensity left pons appears slightly larger. Hyperintensities in the thalamus bilaterally left greater than right appears slightly larger. Hyperintensity left medulla unchanged. Findings compatible with demyelinating disease. No enhancing lesions identified. Ventricle size normal. Cerebral volume normal. Negative for hemorrhage or  mass. Normal enhancement. Vascular: Normal arterial flow voids Skull and upper cervical spine: No focal lesion identified. Sinuses/Orbits: Paranasal sinuses clear.  Negative orbit Other: None IMPRESSION: Findings compatible with multiple sclerosis and demyelinating disease. No acute demyelinization identified. Periventricular white matter lesions stable. Mild progression of lesions in the thalamus bilaterally and in the left pons compared with MRI 2020 Electronically Signed   By: [name] M.D.   On: 07/07/2022 17:36   MR Cervical Spine W or Wo Contrast  Result Date: 07/07/2022 CLINICAL DATA:  Demyelinating disease. EXAM: MRI CERVICAL SPINE WITHOUT AND WITH CONTRAST TECHNIQUE: Multiplanar and multiecho pulse sequences of the cervical spine, to include the craniocervical junction and cervicothoracic junction, were obtained without and with intravenous contrast. CONTRAST:  9.5 mL Vueway IV COMPARISON:  MRI cervical spine 07/11/2019 FINDINGS: Alignment: Mild anterolisthesis C4-5 and mild retrolisthesis C5-6 Vertebrae: Negative for fracture or mass Cord: Chronic cord hyperintensities at C2, C3, C4 similar to the prior study. No enhancing lesions. No cord compression Posterior Fossa, vertebral arteries, paraspinal tissues: Hyperintense lesions in the medulla and pons. No soft tissue lesion in the neck Disc levels: C2-3: Negative C3-4: Small central disc protrusion and mild uncinate spurring. Moderate right foraminal narrowing C4-5: Central and left-sided disc protrusion with spurring. Moderate left foraminal narrowing C5-6: Small central disc protrusion. Bilateral foraminal encroachment due to spurring C6-7: Negative C7-T1: Negative IMPRESSION: Chronic cord hyperintensity at C2, C3, and C4 unchanged. Findings compatible with chronic demyelinating disease. Cervical spondylosis without cord compression. Right foraminal narrowing C3-4 and left foraminal narrowing at C4-5. Bilateral foraminal encroachment C5-6 due  to spurring prominent Electronically Signed   By: [name] M.D.   On: 07/07/2022 17:27    Procedures Procedures    Medications Ordered in ED Medications  piperacillin-tazobactam (ZOSYN) IVPB 3.375 g (3.375 g Intravenous New Bag/Given 07/07/22 2115)  sodium chloride 0.9 % bolus 1,000 mL ( Intravenous Stopped 07/07/22 1926)  acetaminophen (TYLENOL) tablet 650 mg (650 mg Oral Given 07/07/22 1820)  gadopiclenol (VUEWAY) 0.5 MMOL/ML solution 9.5 mL (9.5 mLs Intravenous Contrast Given 07/07/22 1703)  sodium chloride 0.9 % bolus 1,000 mL (1,000 mLs  Intravenous New Bag/Given 07/07/22 2109)   ED Course/ Medical Decision Making/ A&P    61 year old history of MS, wheelchair-bound, neurogenic bladder with chronic indwelling Foley catheter here for evaluation possible UTI.  Wife states that patient has chronic UTIs.  Initially thought similar 3 days ago gave Cipro twice daily the last 3 days.  He has had some increase in generalized weakness.  No fever, he is tolerating p.o. intake.  No recent MS flares.  Does have some small pressure wounds however knows gross evidence of deep space infection or cellulitis.  Plan to replace Foley catheter, obtain clean urinalysis, they do have MRI tomorrow schedule we will get this here to make sure no acute MS flare.  Does have history of bacteremia, will get blood cultures.  Currently afebrile without tachycardia, tachypnea or hypoxia.  I was contacted by MRI tech.  Advised to added orders for MRI brain to outside attention IAC.  MRI tech states orders are now correct this will cover his MRI for tomorrow.  Labs and imaging personally viewed and interpreted:  CBC without leukocytosis Metabolic panel without significant abnormality Lipase 27 Lactic 1.3 AU with small Leuks, rare bacteria, WBC 11-20 similar to prior UA with positive culture for infection.   Discussed results with patient, wife.  Discussed UTI with possible infection.  This was cultured.  We did  review micro from prior last UTIs.  Prior urine culture positive for Pseudomonas only medication sensitive in p.o. form is Cipro>> UTI prior to that was Klebsiella sensitive to Cipro and Bactrim p.o. Patient has already been on 3 days of Cipro. Considered outpatient failure of UTI.  I discussed admission for UTI, weakness.  Patient does not want admission at this time.  Prefers to try Bactrim Po at home.  I did discuss that this would not cover his most recent UTI of Pseudomonas would overly cover for Klebsiella.  Patient and family are willing to try the Bactrim at home and will FU on culture results.  Did discuss if his UTI was only sensitive to Cipro which she has already failed that he would need inpatient management at that time.  Family agreeable, willing to try outpatient abx at this time.  Give dose of Zosyn here in ED which would cover both prior UTIs and additional bag of fluids.                              Medical Decision Making Amount and/or Complexity of Data Reviewed Independent Historian: spouse External Data Reviewed: labs, radiology, ECG and notes. Labs: ordered. Decision-making details documented in ED Course. Radiology: ordered and independent interpretation performed. Decision-making details documented in ED Course. ECG/medicine tests: ordered and independent interpretation performed. Decision-making details documented in ED Course.  Risk OTC drugs. Prescription drug management. Parenteral controlled substances. Decision regarding hospitalization. Diagnosis or treatment significantly limited by social determinants of health.          Final Clinical Impression(s) / ED Diagnoses Final diagnoses:  Acute cystitis with hematuria  Weakness    Rx / DC Orders ED Discharge Orders          Ordered    sulfamethoxazole-trimethoprim (BACTRIM DS) 800-160 MG tablet  2 times daily,   Status:  Discontinued        07/07/22 2027    sulfamethoxazole-trimethoprim (BACTRIM  DS) 800-160 MG tablet  2 times daily        07/07/22 2027  Tu Bayle A, PA-C 07/07/22 2144    Glynn Octave, MD 07/08/22 [phone]

## 2022-07-07 NOTE — Telephone Encounter (Signed)
Referral for pain clinic sent to [facility] as requested by neurologist. Phone: [phone], Fax: [phone]

## 2022-07-07 NOTE — Discharge Instructions (Signed)
Take the antibiotics as prescribed.  If symptoms worsen at home please return to the emergency department for admission.  If his urine cultures not sensitive to the Bactrim

## 2022-07-08 ENCOUNTER — Encounter (HOSPITAL_COMMUNITY): Payer: Self-pay

## 2022-07-08 ENCOUNTER — Ambulatory Visit (HOSPITAL_COMMUNITY): Payer: Medicare Other

## 2022-07-09 LAB — URINE CULTURE: Culture: 20000 — AB

## 2022-07-10 ENCOUNTER — Telehealth: Payer: Self-pay | Admitting: Emergency Medicine

## 2022-07-10 NOTE — Telephone Encounter (Signed)
Post ED Visit - Positive Culture Follow-up: Successful Patient Follow-Up  Culture assessed and recommendations reviewed by:  []  Elenor Quinones, Pharm.D. []  Heide Guile, Pharm.D., BCPS AQ-ID []  Parks Neptune, Pharm.D., BCPS []  Alycia Rossetti, Pharm.D., BCPS []  [place], [place].D., BCPS, AAHIVP []  Legrand Como, Pharm.D., BCPS, AAHIVP []  Salome Arnt, PharmD, BCPS []  Johnnette Gourd, PharmD, BCPS []  Hughes Better, PharmD, BCPS [x]  Titus Dubin, PharmD  Positive urine culture  []  Patient discharged without antimicrobial prescription and treatment is now indicated []  Organism is resistant to prescribed ED discharge antimicrobial []  Patient with positive blood cultures  Changes discussed with ED provider: Mervyn Gay PA  Plan: Call patient for symptom check If symptoms resolved, no treatment necessary If symptoms persist, needs to return to the ED for evaluation and IV antibiotic treatment  Contacted patient and spouse, date 07/10/22, time 1640 Spoke with patient and spouse (via speaker phone) for symptom check. Pt reports symptoms are slowly improving but still present. Instructed to return to the ED for evaluation and IV antibiotic tx. Pt/spouse verbalized understanding of these instructions, but stated they may not return. Importance and encouragement  to follow-up voiced to patient and spouse.   Milus Mallick 07/10/2022, 4:43 PM

## 2022-07-10 NOTE — Progress Notes (Signed)
ED Antimicrobial Stewardship Positive Culture Follow Up   [name] is an 61 y.o. male who presented to [facility] on 07/07/2022 with a chief complaint of suspected UTI due to dark urine and weakness. PMH significant for MS, neurogenic bladder with indwelling floey catheter. His wife administered cipro 500mg  BID x3d prior to presentation with no noted improvement. Pt reports no fevers at home and notes he is likely dehydrated. Patient was discharged on bactrim for UTI based on previous urine culture data and failure of cipro.    Recent Results (from the past 720 hour(s))  Blood culture (routine x 2)     Status: None (Preliminary result)   Collection Time: 07/07/22  5:42 PM   Specimen: BLOOD  Result Value Ref Range Status   Specimen Description   Final    BLOOD BLOOD LEFT HAND Performed at [facility], [place] [address], [place], [place] [address]    Special Requests   Final    BOTTLES DRAWN AEROBIC AND ANAEROBIC Blood Culture adequate volume Performed at [facility] [address], [place], [place] [address]    Culture   Final    NO GROWTH 3 DAYS Performed at [facility], [place] [address] [place], [place] [address]    Report Status PENDING  Incomplete  Blood culture (routine x 2)     Status: None (Preliminary result)   Collection Time: 07/07/22  5:42 PM   Specimen: BLOOD  Result Value Ref Range Status   Specimen Description   Final    BLOOD BLOOD RIGHT FOREARM Performed at [facility] [address], [place], [place] [address]    Special Requests   Final    BOTTLES DRAWN AEROBIC AND ANAEROBIC Blood Culture adequate volume Performed at [facility] [address], [place], [place] [address]    Culture   Final    NO GROWTH 3 DAYS Performed at [facility], [place] [address] [place], [place] [address]    Report Status PENDING  Incomplete  Urine Culture     Status: Abnormal   Collection  Time: 07/07/22  6:05 PM   Specimen: Urine, Catheterized  Result Value Ref Range Status   Specimen Description   Final    URINE, CATHETERIZED Performed at [facility] [address], [place], [place] [address]    Special Requests   Final    NONE Performed at [facility], [place] [address], [place], [place] [address]    Culture 20,000 COLONIES/mL PSEUDOMONAS AERUGINOSA (A)  Final   Report Status 07/09/2022 FINAL  Final   Organism ID, Bacteria PSEUDOMONAS AERUGINOSA (A)  Final      Susceptibility   Pseudomonas aeruginosa - MIC*    CEFTAZIDIME 4 SENSITIVE Sensitive     CIPROFLOXACIN 2 RESISTANT Resistant     GENTAMICIN 4 SENSITIVE Sensitive     IMIPENEM 1 SENSITIVE Sensitive     PIP/TAZO 8 SENSITIVE Sensitive     * 20,000 COLONIES/mL PSEUDOMONAS AERUGINOSA    Treated with Bactrim, organism resistant to prescribed antimicrobial.   Plan: Call for symptom check. If patient is asymptomatic discontinue current antibiotics. If patient remains symptomatic, recommend presentation to the hospital for assessment and IV antibiotics for coverage of causative pathogen.   New antibiotic prescription: None  ED Provider: Cindra Eves Flornce Record 07/10/2022, 4:44 PM Clinical Pharmacist Monday - Friday phone -  [phone] Saturday - Sunday phone - [phone]

## 2022-07-12 LAB — CULTURE, BLOOD (ROUTINE X 2)
Culture: NO GROWTH
Culture: NO GROWTH
Special Requests: ADEQUATE
Special Requests: ADEQUATE

## 2022-07-13 DIAGNOSIS — Z5181 Encounter for therapeutic drug level monitoring: Secondary | ICD-10-CM | POA: Diagnosis not present

## 2022-07-13 DIAGNOSIS — Z79899 Other long term (current) drug therapy: Secondary | ICD-10-CM | POA: Diagnosis not present

## 2022-07-13 DIAGNOSIS — G500 Trigeminal neuralgia: Secondary | ICD-10-CM | POA: Diagnosis not present

## 2022-07-13 DIAGNOSIS — G501 Atypical facial pain: Secondary | ICD-10-CM | POA: Diagnosis not present

## 2022-07-15 ENCOUNTER — Telehealth: Payer: Self-pay | Admitting: Neurology

## 2022-07-15 ENCOUNTER — Other Ambulatory Visit: Payer: Self-pay | Admitting: *Deleted

## 2022-07-15 NOTE — Telephone Encounter (Signed)
Pt's wife, Kawhi Diebold inquiring if have received MRI results. Would  like a call from the nurse.

## 2022-07-15 NOTE — Telephone Encounter (Signed)
Called pt wife. Relayed results per Dr. Garth Bigness note. She verbalized understanding. However, she states Dr. Felecia Shelling was looking specifically into trigeminal nerve and to see if there were any issues there. Wanting to know if he saw anything on MRI about this. Advised per MD that he will review imaging again and let me know. I will call her back.

## 2022-07-15 NOTE — Telephone Encounter (Signed)
Dr. Felecia Shelling- I see where he had MRI brain 07/07/22 via another provider but you did recommend for him to have one.

## 2022-07-15 NOTE — Telephone Encounter (Signed)
Called and spoke w/ wife. Relayed Dr. Garth Bigness message. He saw [facility] 07/13/22/Dr. Catheryn Bacon.  Recommended the following per note from Dr. Catheryn Bacon:  "Procedural interventions - Schedule right sphenopalatine ganglion block with IV sedation at [facility].  - Consider RFA if beneficial.  - Consider then trigeminal nerve block.  Follow-up - Follow up in about 4 weeks (around 08/10/2022)., sooner if needed."  She is going to call back today to see about getting procedure set up. Dr. Catheryn Bacon did not want him taking oxcarbazepine 600mg  po BID with the lamotrigine d/t concerns about affecting sodium level. They had him stop lamotrigine. I updated med list.

## 2022-07-15 NOTE — Telephone Encounter (Signed)
Per Dr. Felecia Shelling: "I don't see any compression of the nerve by a blood vessel loop so I recommend they see [facility] for a procedure and continue the lamotigine.  If he is tolerating the lamotrigine, we can go up to 300 mg a day (one po tid or 1-2 if easier)"

## 2022-07-19 ENCOUNTER — Encounter: Payer: Self-pay | Admitting: Neurology

## 2022-07-20 ENCOUNTER — Other Ambulatory Visit: Payer: Self-pay | Admitting: *Deleted

## 2022-07-20 DIAGNOSIS — G500 Trigeminal neuralgia: Secondary | ICD-10-CM

## 2022-07-20 DIAGNOSIS — G35 Multiple sclerosis: Secondary | ICD-10-CM

## 2022-07-21 NOTE — Telephone Encounter (Signed)
Faxed referral to [facility] at [phone] and [phone]. Received fax confirmation for both. Put referral with fax confirmations up front for pick up.

## 2022-07-30 DIAGNOSIS — G500 Trigeminal neuralgia: Secondary | ICD-10-CM | POA: Diagnosis not present

## 2022-08-04 ENCOUNTER — Telehealth: Payer: Self-pay

## 2022-08-04 NOTE — Telephone Encounter (Signed)
Ocrevus start form faxed to [name]. Received a receipt of confirmation.  Infusion suite already has the Ocrevus order.  Patient is due for his infusion in December.

## 2022-08-16 ENCOUNTER — Encounter (HOSPITAL_BASED_OUTPATIENT_CLINIC_OR_DEPARTMENT_OTHER): Payer: Self-pay

## 2022-08-16 ENCOUNTER — Emergency Department (HOSPITAL_BASED_OUTPATIENT_CLINIC_OR_DEPARTMENT_OTHER)
Admission: EM | Admit: 2022-08-16 | Discharge: 2022-08-16 | Disposition: A | Discharge status: Home / Self Care | Payer: No Typology Code available for payment source | Attending: Emergency Medicine | Admitting: Emergency Medicine

## 2022-08-16 DIAGNOSIS — Z8669 Personal history of other diseases of the nervous system and sense organs: Secondary | ICD-10-CM | POA: Insufficient documentation

## 2022-08-16 DIAGNOSIS — E860 Dehydration: Secondary | ICD-10-CM | POA: Insufficient documentation

## 2022-08-16 DIAGNOSIS — E8721 Acute metabolic acidosis: Secondary | ICD-10-CM | POA: Insufficient documentation

## 2022-08-16 DIAGNOSIS — G35 Multiple sclerosis: Secondary | ICD-10-CM

## 2022-08-16 DIAGNOSIS — R21 Rash and other nonspecific skin eruption: Secondary | ICD-10-CM | POA: Diagnosis not present

## 2022-08-16 DIAGNOSIS — R638 Other symptoms and signs concerning food and fluid intake: Secondary | ICD-10-CM | POA: Diagnosis present

## 2022-08-16 DIAGNOSIS — E872 Acidosis, unspecified: Secondary | ICD-10-CM

## 2022-08-16 LAB — HEPATIC FUNCTION PANEL
ALT: 6 U/L (ref 0–44)
AST: 8 U/L — ABNORMAL LOW (ref 15–41)
Albumin: 4.9 g/dL (ref 3.5–5.0)
Alkaline Phosphatase: 115 U/L (ref 38–126)
Bilirubin, Direct: 0.1 mg/dL (ref 0.0–0.2)
Indirect Bilirubin: 0.5 mg/dL (ref 0.3–0.9)
Total Bilirubin: 0.6 mg/dL (ref 0.3–1.2)
Total Protein: 7.4 g/dL (ref 6.5–8.1)

## 2022-08-16 LAB — CBC WITH DIFFERENTIAL/PLATELET
Abs Immature Granulocytes: 0.02 10*3/uL (ref 0.00–0.07)
Basophils Absolute: 0 10*3/uL (ref 0.0–0.1)
Basophils Relative: 1 %
Eosinophils Absolute: 0.1 10*3/uL (ref 0.0–0.5)
Eosinophils Relative: 2 %
HCT: 45.9 % (ref 39.0–52.0)
Hemoglobin: 15.3 g/dL (ref 13.0–17.0)
Immature Granulocytes: 0 %
Lymphocytes Relative: 9 %
Lymphs Abs: 0.7 10*3/uL (ref 0.7–4.0)
MCH: 31.4 pg (ref 26.0–34.0)
MCHC: 33.3 g/dL (ref 30.0–36.0)
MCV: 94.1 fL (ref 80.0–100.0)
Monocytes Absolute: 0.6 10*3/uL (ref 0.1–1.0)
Monocytes Relative: 8 %
Neutro Abs: 6.6 10*3/uL (ref 1.7–7.7)
Neutrophils Relative %: 80 %
Platelets: 239 10*3/uL (ref 150–400)
RBC: 4.88 MIL/uL (ref 4.22–5.81)
RDW: 13.7 % (ref 11.5–15.5)
WBC: 8.2 10*3/uL (ref 4.0–10.5)
nRBC: 0 % (ref 0.0–0.2)

## 2022-08-16 LAB — URINALYSIS, ROUTINE W REFLEX MICROSCOPIC
Bilirubin Urine: NEGATIVE
Glucose, UA: NEGATIVE mg/dL
Ketones, ur: >80 mg/dL — AB
Leukocytes,Ua: NEGATIVE
Nitrite: NEGATIVE
Protein, ur: 30 mg/dL — AB
Specific Gravity, Urine: 1.023 (ref 1.005–1.030)
pH: 5.5 (ref 5.0–8.0)

## 2022-08-16 LAB — BASIC METABOLIC PANEL
Anion gap: 18 — ABNORMAL HIGH (ref 5–15)
BUN: 10 mg/dL (ref 6–20)
CO2: 17 mmol/L — ABNORMAL LOW (ref 22–32)
Calcium: 9.5 mg/dL (ref 8.9–10.3)
Chloride: 102 mmol/L (ref 98–111)
Creatinine, Ser: 0.86 mg/dL (ref 0.61–1.24)
GFR, Estimated: >60 mL/min (ref >60)
Glucose, Bld: 72 mg/dL (ref 70–99)
Potassium: 3.9 mmol/L (ref 3.5–5.1)
Sodium: 137 mmol/L (ref 135–145)

## 2022-08-16 MED ORDER — ONDANSETRON 4 MG PO TBDP: ORAL_TABLET | ORAL | 0 refills | Status: DC

## 2022-08-16 MED ORDER — SODIUM CHLORIDE 0.9 % IV BOLUS
1000.0000 mL | Freq: Once | INTRAVENOUS | Status: AC
  Administered 2022-08-16: 1000 mL via INTRAVENOUS

## 2022-08-16 NOTE — ED Provider Notes (Signed)
[facility] EMERGENCY DEPT Provider Note   CSN: [id number] Arrival date & time: 08/16/22  0706     History  Chief Complaint  Patient presents with   not eating/drinking    [name] is a 61 y.o. male.  Patient with MS history followed by outpatient neurolog[facility] and [facility] presents with his wife after having decreased appetite for 2 days.  Patient has been struggling with trigeminal neuralgia and had MRI for this and has outpatient follow-up later this month for treatment.  Patient's had no vomiting or diarrhea, no abdominal or chest pain or shortness of breath or cough.  No fevers.  Patient has history of Pseudomonas UTIs as well.  Urines been darker lately with decreased oral fluid intake.  Patient's been generally weaker than normal.  He will tolerate sips of water and that it.  With history of Pseudomonas UTI patient's wife started his Cipro yesterday.  Patient has urinary catheter and she changed the bag this morning.  Patient has injections for MS regularly and is due next in December.  Patient's only had occasional exacerbations of MS usually has challenges more related to urine infection or dehydration.       Home Medications Prior to Admission medications   Medication Sig Start Date End Date Taking? Authorizing Provider  ondansetron (ZOFRAN-ODT) 4 MG disintegrating tablet 4mg  ODT q4 hours prn nausea/vomit 08/16/22  Yes 13/5/23, MD  acetaminophen (TYLENOL) 325 MG tablet Take 2 tablets (650 mg total) by mouth every 6 (six) hours as needed for mild pain, fever or moderate pain (or Fever >/= 101). 03/12/22   05/12/22, MD  albuterol (VENTOLIN HFA) 108 (90 Base) MCG/ACT inhaler Inhale 2 puffs into the lungs every 6 (six) hours as needed. Patient taking differently: Inhale 1-2 puffs into the lungs every 6 (six) hours as needed for wheezing. 07/24/21   07/26/21, MD  Ascorbic Acid (VITAMIN C) 100 MG tablet Take 100 mg by mouth  daily.    [provider]  baclofen (LIORESAL) 10 MG tablet 1/2 tablet in the morning and at noon, and 2 tablets at night Patient taking differently: Take 20 mg by mouth daily as needed for muscle spasms. 05/20/16   07/20/16, MD  Carboxymethylcellulose Sod PF (THERATEARS) 1 % GEL Place 1 drop into both eyes daily as needed (dry eyes).    [provider]  Carboxymethylcellulose Sodium (THERATEARS OP) Place 1 drop into both eyes daily as needed (dry eyes).    [provider]  cholecalciferol (VITAMIN D3) 25 MCG (1000 UNIT) tablet Take 1,000 Units by mouth daily.    [provider]  D-MANNOSE PO Take 1,300 mg by mouth See admin instructions. 1300 mg with cranberry extract for uti    [provider]  Dextromethorphan-Quinidine 20-10 MG CAPS Take 1 tablet by mouth 2 (two) times daily. Patient taking differently: Take 1 capsule by mouth daily. 05/27/17   05/29/17, MD  escitalopram (LEXAPRO) 10 MG tablet Take 10 mg by mouth daily.    [provider]  gabapentin (NEURONTIN) 100 MG capsule Take 1 capsule (100 mg total) by mouth at bedtime. Patient taking differently: Take 100 mg by mouth daily as needed (pain). 01/20/21   03/22/21, MD  ibuprofen (ADVIL) 200 MG tablet Take 600 mg by mouth every 6 (six) hours as needed for headache or moderate pain.    [provider]  methenamine (HIPREX) 1 g tablet Take 1  g by mouth 2 (two) times daily with a meal.    [provider]  methocarbamol (ROBAXIN) 500 MG tablet TAKE 1 TABLET BY MOUTH THREE TIMES A DAY 04/21/22   Sater, Nanine Means, MD  modafinil (PROVIGIL) 100 MG tablet TAKE 1 TABLET (100 MG TOTAL) BY MOUTH IN THE MORNING. *SENT FOR PA* Patient taking differently: Take 100 mg by mouth daily. 11/29/20   Kathrynn Ducking, MD  ocrelizumab 600 mg in sodium chloride 0.9 % 500 mL Inject 600 mg into the vein every 6 (six) months.     [provider]  Oxcarbazepine  (TRILEPTAL) 300 MG tablet Take 600 mg by mouth 2 (two) times daily. 05/28/22   [provider]      Allergies    Gadopiclenol    Review of Systems   Review of Systems  Constitutional:  Negative for chills and fever.  HENT:  Negative for congestion.   Eyes:  Negative for visual disturbance.  Respiratory:  Negative for shortness of breath.   Cardiovascular:  Negative for chest pain.  Gastrointestinal:  Positive for nausea. Negative for abdominal pain and vomiting.  Genitourinary:  Negative for dysuria and flank pain.  Musculoskeletal:  Negative for back pain, neck pain and neck stiffness.  Skin:  Negative for rash.  Neurological:  Positive for weakness. Negative for light-headedness and headaches.    Physical Exam Updated Vital Signs BP (!) 101/59   Pulse 72   Temp 98 F (36.7 C) (Oral)   Resp 16   SpO2 99%  Physical Exam Vitals and nursing note reviewed.  Constitutional:      General: He is not in acute distress.    Appearance: He is well-developed.  HENT:     Head: Normocephalic and atraumatic.     Mouth/Throat:     Mouth: Mucous membranes are dry.  Eyes:     General:        Right eye: No discharge.        Left eye: No discharge.     Conjunctiva/sclera: Conjunctivae normal.  Neck:     Trachea: No tracheal deviation.  Cardiovascular:     Rate and Rhythm: Normal rate and regular rhythm.  Pulmonary:     Effort: Pulmonary effort is normal.     Breath sounds: Normal breath sounds.  Abdominal:     General: There is no distension.     Palpations: Abdomen is soft.     Tenderness: There is no abdominal tenderness. There is no guarding.  Musculoskeletal:     Cervical back: Normal range of motion and neck supple. No rigidity.  Skin:    General: Skin is warm.     Capillary Refill: Capillary refill takes 2 to 3 seconds.     Findings: Rash (anterior shins no cellulitis, chronic skin changes/ erythema) present.  Neurological:     General: No focal deficit present.      Mental Status: He is alert.     Cranial Nerves: No cranial nerve deficit.     Comments: General fatigue, general weakness, and ambulance chair, minimal verbal but does follow commands, extraocular muscle function intact, pupils equal bilateral, minimal movement of left arm and hand.  Psychiatric:     Comments: General fatigue and general weakness, minimal verbal     ED Results / Procedures / Treatments   Labs (all labs ordered are listed, but only abnormal results are displayed) Labs Reviewed  BASIC METABOLIC PANEL - Abnormal; Notable for the following components:  Result Value   CO2 17 (*)    Anion gap 18 (*)    All other components within normal limits  HEPATIC FUNCTION PANEL - Abnormal; Notable for the following components:   AST 8 (*)    All other components within normal limits  URINALYSIS, ROUTINE W REFLEX MICROSCOPIC - Abnormal; Notable for the following components:   Hgb urine dipstick SMALL (*)    Ketones, ur >80 (*)    Protein, ur 30 (*)    Bacteria, UA RARE (*)    All other components within normal limits  URINE CULTURE  CBC WITH DIFFERENTIAL/PLATELET    EKG None  Radiology No results found.  Procedures Procedures    Medications Ordered in ED Medications  sodium chloride 0.9 % bolus 1,000 mL (1,000 mLs Intravenous New Bag/Given 08/16/22 0852)  sodium chloride 0.9 % bolus 1,000 mL (1,000 mLs Intravenous New Bag/Given 08/16/22 0957)    ED Course/ Medical Decision Making/ A&P                           Medical Decision Making Amount and/or Complexity of Data Reviewed Labs: ordered.  Risk Prescription drug management.   Patient with MS history and outpatient follow-up presents with general weakness and nausea.  Discussed differential diagnosis with his wife including dehydration, metabolic acidosis, electrolyte abnormalities, medication side effects, trigeminal neuralgia related, MS flare, UTI, other.  No respiratory symptoms to suggest pneumonia  abdominal exam is reassuring.  Vital signs reassuring.  Clinical signs of dehydration patient received 2 boluses of IV fluids second 1 was ordered after significant greater than 80 ketones in the urine.  No signs significant infection urine however urine culture sent especially with high risk history and Pseudomonas history.  Discussed culture results in the past with patient's wife.  Patient was started on Cipro recommended continuing Cipro for 2 more days until they get the culture results.  Remainder of blood work showed signs of dehydration metabolic acidosis mild bicarb 17, anion gap of 18.  No fever, patient clinically improved on reassessment after IV fluids.  Patient stable for discharge and close outpatient follow-up reasons return discussed with wife who is comfortable this plan.        Final Clinical Impression(s) / ED Diagnoses Final diagnoses:  Dehydration  History of multiple sclerosis (HCC)  Metabolic acidosis    Rx / DC Orders ED Discharge Orders          Ordered    ondansetron (ZOFRAN-ODT) 4 MG disintegrating tablet        08/16/22 1129              Blane Ohara, MD 08/16/22 1137

## 2022-08-16 NOTE — ED Triage Notes (Signed)
His wife tells me that pt. Has been unable/unwilling to eat or drink x 2 days. She tells me he has M.S. and trigeminal neuralgia, both of which are contributing to this.

## 2022-08-16 NOTE — Discharge Instructions (Signed)
Take your home Cipro antibiotics as directed until you follow-up urine culture result and reassessment Tuesday or Wednesday of this week. Continue hydration at home. For persistent nausea can try Zofran sublingual. Return for lethargy, fevers or new concerns.

## 2022-08-17 ENCOUNTER — Ambulatory Visit (INDEPENDENT_AMBULATORY_CARE_PROVIDER_SITE_OTHER): Payer: Medicare Other | Admitting: Neurology

## 2022-08-17 ENCOUNTER — Encounter: Payer: Self-pay | Admitting: Neurology

## 2022-08-17 VITALS — BP 113/70 | HR 78 | Ht 70.0 in | Wt 209.0 lb

## 2022-08-17 DIAGNOSIS — Z79899 Other long term (current) drug therapy: Secondary | ICD-10-CM | POA: Diagnosis not present

## 2022-08-17 DIAGNOSIS — G8250 Quadriplegia, unspecified: Secondary | ICD-10-CM

## 2022-08-17 DIAGNOSIS — G500 Trigeminal neuralgia: Secondary | ICD-10-CM | POA: Diagnosis not present

## 2022-08-17 DIAGNOSIS — G35 Multiple sclerosis: Secondary | ICD-10-CM

## 2022-08-17 DIAGNOSIS — F482 Pseudobulbar affect: Secondary | ICD-10-CM

## 2022-08-17 DIAGNOSIS — N319 Neuromuscular dysfunction of bladder, unspecified: Secondary | ICD-10-CM | POA: Diagnosis not present

## 2022-08-17 LAB — URINE CULTURE

## 2022-08-17 NOTE — Progress Notes (Signed)
[facility]  PATIENT: [name] DOB: Aug 27, 1961  REFERRING DOCTOR OR PCP: [name], MD SOURCE: Patient, notes from Dr. Anne Hahn, imaging and lab reports, MRI images personally reviewed.  _________________________________   HISTORICAL  CHIEF COMPLAINT:  Chief Complaint  Patient presents with   Follow-up    Pt in room #6 with his wife. Pt here today for f/u.    HISTORY OF PRESENT ILLNESS:  [name] is a 61 y.o. man with a relapsing form of secondary progressive MS.  Update 08/17/2022:  He is experiencing more pain in the right jaw/cheek pain 04/13/2022 consistent with trigeminal neuralgia.Marland Kitchen  He is on oxcarbazepine.  Due to risk of hyponatremia, he did not start the lamotrigine.  He is also on gabapentin but at a low dose of 100 mg twice a day.  He had been on baclofen but stopped.  He is scheduled 08/25/2022 for a procedure at the [facility].          He is not eating or drinking much.   He had an emergency room visit and received IV fluids due to dehydration.  He has noted more weakness in the stronger left arm.  However, after he received IV fluids he was stronger that day.  Today he is weaker again.  In July, when pain started it seemed to involve the entire right side but now is from eye to the jaw but the cheek is the worse.   Initially he took Tylenol and Advil but they have not helped much.  Pain involves trains of jolts into the region.     He spoke to his neurologist at the [facility] at the [place] and was started on Tegretol.    MRI of the brain from 2020 showed a focus in the pons, but it is towards the left and would not explain symptoms.  I do not see any abnormal findings involving the dorsal root entry zone or the proximal trigeminal nerve on the right.  There are no adjacent blood vessels.  He has no definite exacerbation but has slow progression.  He is on Ocrevus as his disease modifying therapy and tolerates it well.  He has  not had any exacerbations since starting it around 2018 or 2019   He is wheelchair bound.    He last used a cane in 2009 and then [place] crutches.  After a bad fall that year, he started to use a wheelchair.  He was able to transfer independently more years and then transfer with assistance.   He has needed a lift to transfer since a long hospital stay for a pseudomonas UTI (urosepsis) May 2021.     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    He is on baclofen 10 mg 2-3 times a day.  Tizanidine caused hallucinations.  He has numbness waist down.  He takes gabapentin with benefit for dysesthesias.   He has a Foley catheter changed every month.  He is colonized and is on methenamine and Vit C.   If getting weaker or cognitive changes he takes Cipro or Amoxicillin and gets a urine culture done for sensitivity.       He has a strong FH of MS with two sisters, one niece.   He [place] people with [place].     MS History: He was diagnosed in 2007 after presenting with gradual worsening of balance.  Around that time, he had hurt  his knee but even when knee improved, he flet off balance and would hold the wall or furniture at time.   In retrospect, he had some symptoms in 2001.  At that me an MRI was reportedly normal.  That year, he had fairly sudden onset of numbness below his waist that gradually improved over the next few weeks.     He was in a trial comparing Rebif and Betaseron n 2007 and was initially on Rebif and then changed to Betaseron.   He had injection site reactions.   He started taking GIlenya in 2011.    He switched to Ocrevus in 2019.     IMAGING: MRI of the head 07/11/2019 shows T2 hyperintense foci within the left posterior medulla, cerebellar hemispheres, lower left central pons, right midbrain, left thalamus, and in the periventricular, juxtacortical and deep white matter of both hemispheres.  No definite changes compared to the MRI from  08/01/2014.  MRI of the brain 08/01/2014 shows T2 hyperintense foci within the left posterior medulla, cerebellar hemispheres, lower left central pons, right midbrain, left thalamus, and in the periventricular, juxtacortical and deep white matter of both hemispheres.  No definite changes compared to the MRI from 08/01/2014.  The infratentorial lesions were not clearly present on the previous MRI from 2007 and there has also been some progression in the hemispheres.Marland Kitchen  MRI of the brain 01/11/2006 shows T2/FLAIR hyperintense foci in the hemispheres consistent with MS  MRI cervical spine 07/11/2019 shows foci seen on the previous MRI as well as a focus at T2-T3.  MRI of the cervical spine 01/10/2006 shows T2 hyperintense foci within the spinal cord Adjacent to C5, centrally towards the left adjacent to T1 and at T2-T3  REVIEW OF SYSTEMS: Constitutional: No fevers, chills, sweats, or change in appetite Eyes: No visual changes, double vision, eye pain Ear, nose and throat: No hearing loss, ear pain, nasal congestion, sore throat Cardiovascular: No chest pain, palpitations Respiratory:  No shortness of breath at rest or with exertion.   No wheezes GastrointestinaI: No nausea, vomiting, diarrhea, abdominal pain, fecal incontinence Genitourinary:  No dysuria, urinary retention or frequency.  No nocturia. Musculoskeletal:  No neck pain, back pain Integumentary: No rash, pruritus, skin lesions Neurological: as above Psychiatric: No depression at this time.  No anxiety Endocrine: No palpitations, diaphoresis, change in appetite, change in weigh or increased thirst Hematologic/Lymphatic:  No anemia, purpura, petechiae. Allergic/Immunologic: No itchy/runny eyes, nasal congestion, recent allergic reactions, rashes  ALLERGIES: No Known Allergies  HOME MEDICATIONS:  Current Outpatient Medications:    lamoTRIgine (LAMICTAL) 100 MG tablet, Take 1/2 po qd x 3 days, then 1/2 po bid x 3 days, then 1/2 po tid,  then 1 po bid, Disp: 60 tablet, Rfl: 5   acetaminophen (TYLENOL) 325 MG tablet, Take 2 tablets (650 mg total) by mouth every 6 (six) hours as needed for mild pain, fever or moderate pain (or Fever >/= 101)., Disp: 30 tablet, Rfl: 0   albuterol (VENTOLIN HFA) 108 (90 Base) MCG/ACT inhaler, Inhale 2 puffs into the lungs every 6 (six) hours as needed. (Patient taking differently: Inhale 1-2 puffs into the lungs every 6 (six) hours as needed for wheezing.), Disp: 1 each, Rfl: 3   Ascorbic Acid (VITAMIN C) 100 MG tablet, Take 100 mg by mouth daily., Disp: , Rfl:    baclofen (LIORESAL) 10 MG tablet, 1/2 tablet in the morning and at noon, and 2 tablets at night (Patient taking differently: Take 20 mg by mouth daily  as needed for muscle spasms.), Disp: 270 each, Rfl: 3   Carboxymethylcellulose Sod PF (THERATEARS) 1 % GEL, Place 1 drop into both eyes daily as needed (dry eyes)., Disp: , Rfl:    Carboxymethylcellulose Sodium (THERATEARS OP), Place 1 drop into both eyes daily as needed (dry eyes)., Disp: , Rfl:    cholecalciferol (VITAMIN D3) 25 MCG (1000 UNIT) tablet, Take 1,000 Units by mouth daily., Disp: , Rfl:    D-MANNOSE PO, Take 1,300 mg by mouth See admin instructions. 1300 mg with cranberry extract for uti, Disp: , Rfl:    Dextromethorphan-Quinidine 20-10 MG CAPS, Take 1 tablet by mouth 2 (two) times daily. (Patient taking differently: Take 1 capsule by mouth daily.), Disp: 60 capsule, Rfl: 3   escitalopram (LEXAPRO) 10 MG tablet, Take 10 mg by mouth daily., Disp: , Rfl:    gabapentin (NEURONTIN) 100 MG capsule, Take 1 capsule (100 mg total) by mouth at bedtime. (Patient taking differently: Take 100 mg by mouth daily as needed (pain).), Disp: 90 capsule, Rfl: 3   ibuprofen (ADVIL) 200 MG tablet, Take 600 mg by mouth every 6 (six) hours as needed for headache or moderate pain., Disp: , Rfl:    methenamine (HIPREX) 1 g tablet, Take 1 g by mouth 2 (two) times daily with a meal., Disp: , Rfl:     methocarbamol (ROBAXIN) 500 MG tablet, TAKE 1 TABLET BY MOUTH THREE TIMES A DAY, Disp: 30 tablet, Rfl: 0   modafinil (PROVIGIL) 100 MG tablet, TAKE 1 TABLET (100 MG TOTAL) BY MOUTH IN THE MORNING. *SENT FOR PA* (Patient taking differently: Take 100 mg by mouth daily.), Disp: 90 tablet, Rfl: 1   ocrelizumab 600 mg in sodium chloride 0.9 % 500 mL, Inject 600 mg into the vein every 6 (six) months. 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    PAST SURGICAL HISTORY: Past Surgical History:  Procedure Laterality Date   eye surgeries     x 2; bilateral 8 and 76    FAMILY HISTORY: Family History  Problem Relation Age of Onset   Cancer Father    Multiple sclerosis Sister    Seizures Maternal Uncle    Parkinsonism Maternal Uncle    Multiple sclerosis Sister    Multiple sclerosis Paternal Uncle    Multiple sclerosis Other    Lung cancer Other        parent   Uterine cancer Other        other    SOCIAL HISTORY:  Social History   Socioeconomic History   Marital status: Married    Spouse name: joy   Number of children: 2   Years of education: college   Highest education level: Not on file  Occupational History   Occupation: disabled  Tobacco Use   Smoking status: Former   Smokeless tobacco: Never  Scientific laboratory technician Use: Not on file  Substance and Sexual Activity   Alcohol use: No    Comment: rare    Drug use: No   Sexual activity: Not on file  Other Topics Concern   Not on file  Social History Narrative   Lives at home, married  Patient is right handed.   Patient drinks 1 cup caffeine daily.   Social Determinants of Health   Financial Resource Strain: Not on file  Food Insecurity: Not on file  Transportation  Needs: No Transportation Needs (07/04/2020)   PRAPARE - Administrator, Civil Service (Medical): No    Lack of Transportation (Non-Medical): No  Physical Activity: Not on file  Stress: Not on file  Social Connections: Not on file  Intimate Partner Violence: Not on file     PHYSICAL EXAM  Vitals:   07/01/22 1535  BP: 109/66  Pulse: 63  Weight: 209 lb (94.8 kg)  Height: 5\' 10"  (1.778 m)    Body mass index is 29.99 kg/m.   General: The patient is well-developed and well-nourished and in no acute distress  HEENT:  Head is [place]/AT.  Sclera are anicteric.    Skin: Extremities are without rash or  edema.  Musculoskeletal:  Back is nontender  Neurologic Exam  Mental status: The patient is alert and oriented x 3 at the time of the examination. The patient has apparent normal recent and remote memory, with an apparently normal attention span and concentration ability.   Speech is normal.  Cranial nerves: Extraocular movements show an INO right greater than left and also some nystagmus on upgaze.  Reduced visual acuity OD.  Facial symmetry is present.  Facial strength appears symmetric.  However, he has allodynia with altered sensation in the V2 distribution more than the V3 distribution and to a lesser extent in the V1 distribution.  Trapezius and sternocleidomastoid strength is normal.  Mild dysarthria is noted.  The tongue is midline, and the patient has symmetric elevation of the soft palate. No obvious hearing deficits are noted.  Motor:  Muscle bulk is normal.   Tone is increased legs > arms; right > left . Strength is  3/5 in left arm (weaker than last visit), 2/5 right arm, 0/5 in legs.   Sensory: He is unable to feel touch or vibration in the legs.  Gait and station: Wheelchair bound  Reflexes: Deep tendon reflexes are symmetric and increased in legs with sustained ankle clonus.   Plantar responses are extensor .    DIAGNOSTIC DATA (LABS, IMAGING, TESTING) - I  reviewed patient records, labs, notes, testing and imaging myself where available.  Lab Results  Component Value Date   WBC 7.7 03/12/2022   HGB 13.6 03/12/2022   HCT 41.3 03/12/2022   MCV 92.6 03/12/2022   PLT 155 03/12/2022      Component Value Date/Time   NA 138 03/12/2022 0306   NA 141 01/26/2022 1207   K 3.9 03/12/2022 0306   CL 112 (H) 03/12/2022 0306   CO2 18 (L) 03/12/2022 0306   GLUCOSE 84 03/12/2022 0306   BUN 14 03/12/2022 0306   BUN 13 01/26/2022 1207   CREATININE 0.94 03/12/2022 0306   CALCIUM 8.5 (L) 03/12/2022 0306   PROT 6.5 03/09/2022 1935   PROT 6.6 01/26/2022 1207   ALBUMIN 4.2 03/09/2022 1935   ALBUMIN 4.6 01/26/2022 1207   AST 17 03/09/2022 1935   ALT 19 03/09/2022 1935   ALKPHOS 120 03/09/2022 1935   BILITOT 0.5 03/09/2022 1935   BILITOT 0.5 01/26/2022 1207   GFRNONAA >60 03/12/2022 0306   GFRAA 84 07/29/2020 1429   Lab Results  Component Value Date   CHOL 234 (H) 08/14/2015   HDL 47.80 08/14/2015   LDLCALC 163 (H) 08/14/2015   LDLDIRECT 192.1 08/04/2012  TRIG 116.0 08/14/2015   CHOLHDL 5 08/14/2015   Lab Results  Component Value Date   HGBA1C 5.4 08/14/2015   No results found for: "VITAMINB12" Lab Results  Component Value Date   TSH 0.82 07/19/2018       ASSESSMENT AND PLAN  Multiple sclerosis, secondary progressive (HCC) - Plan: Ambulatory referral to Pain Clinic, MR BRAIN/IAC W WO CONTRAST  Right trigeminal neuralgia - Plan: Ambulatory referral to Pain Clinic, MR BRAIN/IAC W WO CONTRAST  Spastic quadriparesis (HCC)  High risk medication use  Neurogenic bladder   He has trigeminal neuralgia right.  Continue oxcarbazepine and take tid on a scheduled basis rather than 1-3 a day.   Add back the baclofen and try to go up to 3 times a day if tolerated (had stopped).   Continue gabapentin (he is on low-dose but higher dose made him sleepy) He will have a procedure with anesthesiology pain  in 1-2 weeks.   I do not believe he is  having an exacerbation as the strength improved after he received IV fluids.   They will return to see me in 4 months or sooner if there are new or worsening neurologic symptoms.  40-minute office visit with the majority of the time spent face-to-face for history and physical, discussion/counseling and decision-making.  Additional time with record review and documentation.  Quaniyah Bugh A. Epimenio Foot, MD, [facility] 07/01/2022, 5:24 PM Certified in Neurology, Clinical Neurophysiology, Sleep Medicine and Neuroimaging  [facility] [address] [place], [place] [address] [phone]

## 2022-08-19 ENCOUNTER — Other Ambulatory Visit: Payer: Self-pay

## 2022-08-19 ENCOUNTER — Inpatient Hospital Stay (HOSPITAL_COMMUNITY)
Admission: EM | Admit: 2022-08-19 | Discharge: 2022-08-21 | DRG: 640 | Disposition: A | Discharge status: Home Health | Payer: No Typology Code available for payment source | Attending: Internal Medicine | Admitting: Internal Medicine

## 2022-08-19 ENCOUNTER — Encounter (HOSPITAL_COMMUNITY): Payer: Self-pay | Admitting: Emergency Medicine

## 2022-08-19 ENCOUNTER — Emergency Department (HOSPITAL_COMMUNITY): Payer: No Typology Code available for payment source

## 2022-08-19 DIAGNOSIS — Z87891 Personal history of nicotine dependence: Secondary | ICD-10-CM

## 2022-08-19 DIAGNOSIS — N319 Neuromuscular dysfunction of bladder, unspecified: Secondary | ICD-10-CM | POA: Diagnosis present

## 2022-08-19 DIAGNOSIS — F32A Depression, unspecified: Secondary | ICD-10-CM | POA: Diagnosis not present

## 2022-08-19 DIAGNOSIS — E860 Dehydration: Secondary | ICD-10-CM | POA: Diagnosis not present

## 2022-08-19 DIAGNOSIS — D72829 Elevated white blood cell count, unspecified: Secondary | ICD-10-CM | POA: Diagnosis present

## 2022-08-19 DIAGNOSIS — G9340 Encephalopathy, unspecified: Secondary | ICD-10-CM | POA: Diagnosis present

## 2022-08-19 DIAGNOSIS — G9341 Metabolic encephalopathy: Secondary | ICD-10-CM | POA: Diagnosis present

## 2022-08-19 DIAGNOSIS — Z66 Do not resuscitate: Secondary | ICD-10-CM | POA: Diagnosis present

## 2022-08-19 DIAGNOSIS — E876 Hypokalemia: Secondary | ICD-10-CM | POA: Diagnosis present

## 2022-08-19 DIAGNOSIS — G35 Multiple sclerosis: Secondary | ICD-10-CM | POA: Diagnosis present

## 2022-08-19 DIAGNOSIS — G8250 Quadriplegia, unspecified: Secondary | ICD-10-CM | POA: Diagnosis present

## 2022-08-19 DIAGNOSIS — Z888 Allergy status to other drugs, medicaments and biological substances status: Secondary | ICD-10-CM

## 2022-08-19 DIAGNOSIS — N179 Acute kidney failure, unspecified: Secondary | ICD-10-CM | POA: Diagnosis present

## 2022-08-19 DIAGNOSIS — R531 Weakness: Secondary | ICD-10-CM | POA: Diagnosis not present

## 2022-08-19 DIAGNOSIS — K5900 Constipation, unspecified: Secondary | ICD-10-CM | POA: Diagnosis present

## 2022-08-19 DIAGNOSIS — L89312 Pressure ulcer of right buttock, stage 2: Secondary | ICD-10-CM | POA: Diagnosis present

## 2022-08-19 DIAGNOSIS — E8721 Acute metabolic acidosis: Secondary | ICD-10-CM | POA: Diagnosis present

## 2022-08-19 DIAGNOSIS — Z87440 Personal history of urinary (tract) infections: Secondary | ICD-10-CM

## 2022-08-19 DIAGNOSIS — Z820 Family history of epilepsy and other diseases of the nervous system: Secondary | ICD-10-CM

## 2022-08-19 DIAGNOSIS — Z993 Dependence on wheelchair: Secondary | ICD-10-CM

## 2022-08-19 DIAGNOSIS — G500 Trigeminal neuralgia: Secondary | ICD-10-CM | POA: Diagnosis present

## 2022-08-19 DIAGNOSIS — R4182 Altered mental status, unspecified: Secondary | ICD-10-CM

## 2022-08-19 DIAGNOSIS — Z79899 Other long term (current) drug therapy: Secondary | ICD-10-CM

## 2022-08-19 DIAGNOSIS — G35C1 Active secondary progressive multiple sclerosis: Secondary | ICD-10-CM | POA: Diagnosis present

## 2022-08-19 DIAGNOSIS — E669 Obesity, unspecified: Secondary | ICD-10-CM | POA: Diagnosis present

## 2022-08-19 DIAGNOSIS — L8990 Pressure ulcer of unspecified site, unspecified stage: Secondary | ICD-10-CM | POA: Insufficient documentation

## 2022-08-19 DIAGNOSIS — Z6830 Body mass index (BMI) 30.0-30.9, adult: Secondary | ICD-10-CM

## 2022-08-19 DIAGNOSIS — J984 Other disorders of lung: Secondary | ICD-10-CM

## 2022-08-19 HISTORY — DX: Trigeminal neuralgia: G50.0

## 2022-08-19 LAB — URINALYSIS, ROUTINE W REFLEX MICROSCOPIC
Bilirubin Urine: NEGATIVE
Glucose, UA: NEGATIVE mg/dL
Ketones, ur: 80 mg/dL — AB
Leukocytes,Ua: NEGATIVE
Nitrite: NEGATIVE
Protein, ur: 100 mg/dL — AB
Specific Gravity, Urine: 1.019 (ref 1.005–1.030)
pH: 5 (ref 5.0–8.0)

## 2022-08-19 LAB — CBC WITH DIFFERENTIAL/PLATELET
Abs Immature Granulocytes: 0.05 10*3/uL (ref 0.00–0.07)
Basophils Absolute: 0 10*3/uL (ref 0.0–0.1)
Basophils Relative: 0 %
Eosinophils Absolute: 0 10*3/uL (ref 0.0–0.5)
Eosinophils Relative: 0 %
HCT: 51.2 % (ref 39.0–52.0)
Hemoglobin: 17.6 g/dL — ABNORMAL HIGH (ref 13.0–17.0)
Immature Granulocytes: 0 %
Lymphocytes Relative: 4 %
Lymphs Abs: 0.5 10*3/uL — ABNORMAL LOW (ref 0.7–4.0)
MCH: 32.2 pg (ref 26.0–34.0)
MCHC: 34.4 g/dL (ref 30.0–36.0)
MCV: 93.6 fL (ref 80.0–100.0)
Monocytes Absolute: 0.5 10*3/uL (ref 0.1–1.0)
Monocytes Relative: 4 %
Neutro Abs: 10.7 10*3/uL — ABNORMAL HIGH (ref 1.7–7.7)
Neutrophils Relative %: 92 %
Platelets: 233 10*3/uL (ref 150–400)
RBC: 5.47 MIL/uL (ref 4.22–5.81)
RDW: 13.8 % (ref 11.5–15.5)
WBC: 11.8 10*3/uL — ABNORMAL HIGH (ref 4.0–10.5)
nRBC: 0 % (ref 0.0–0.2)

## 2022-08-19 LAB — COMPREHENSIVE METABOLIC PANEL
ALT: 10 U/L (ref 0–44)
AST: 14 U/L — ABNORMAL LOW (ref 15–41)
Albumin: 4.5 g/dL (ref 3.5–5.0)
Alkaline Phosphatase: 117 U/L (ref 38–126)
Anion gap: 20 — ABNORMAL HIGH (ref 5–15)
BUN: 9 mg/dL (ref 6–20)
CO2: 12 mmol/L — ABNORMAL LOW (ref 22–32)
Calcium: 9.5 mg/dL (ref 8.9–10.3)
Chloride: 109 mmol/L (ref 98–111)
Creatinine, Ser: 1.37 mg/dL — ABNORMAL HIGH (ref 0.61–1.24)
GFR, Estimated: 59 mL/min — ABNORMAL LOW (ref >60)
Glucose, Bld: 124 mg/dL — ABNORMAL HIGH (ref 70–99)
Potassium: 4 mmol/L (ref 3.5–5.1)
Sodium: 141 mmol/L (ref 135–145)
Total Bilirubin: 1.4 mg/dL — ABNORMAL HIGH (ref 0.3–1.2)
Total Protein: 7.3 g/dL (ref 6.5–8.1)

## 2022-08-19 LAB — TSH: TSH: 0.746 u[IU]/mL (ref 0.350–4.500)

## 2022-08-19 LAB — LACTIC ACID, PLASMA
Lactic Acid, Venous: 1.8 mmol/L (ref 0.5–1.9)
Lactic Acid, Venous: 1.4 mmol/L (ref 0.5–1.9)

## 2022-08-19 LAB — TROPONIN I (HIGH SENSITIVITY)
Troponin I (High Sensitivity): 4 ng/L (ref <18)
Troponin I (High Sensitivity): 5 ng/L (ref <18)

## 2022-08-19 LAB — CK: Total CK: 20 U/L — ABNORMAL LOW (ref 49–397)

## 2022-08-19 MED ORDER — ENOXAPARIN SODIUM 40 MG/0.4ML IJ SOSY
40.0000 mg | PREFILLED_SYRINGE | Freq: Every day | INTRAMUSCULAR | Status: DC
  Administered 2022-08-19 – 2022-08-21 (×3): 40 mg via SUBCUTANEOUS
  Filled 2022-08-19 (×3): qty 0.4

## 2022-08-19 MED ORDER — HYDROMORPHONE HCL 1 MG/ML IJ SOLN
1.0000 mg | Freq: Once | INTRAMUSCULAR | Status: AC
  Administered 2022-08-19: 1 mg via INTRAVENOUS
  Filled 2022-08-19: qty 1

## 2022-08-19 MED ORDER — POLYVINYL ALCOHOL 1.4 % OP SOLN: 1.0000 [drp] | Freq: Every day | OPHTHALMIC | Status: DC | PRN

## 2022-08-19 MED ORDER — ALBUTEROL SULFATE (2.5 MG/3ML) 0.083% IN NEBU: 2.5000 mg | INHALATION_SOLUTION | RESPIRATORY_TRACT | Status: DC | PRN

## 2022-08-19 MED ORDER — POLYETHYLENE GLYCOL 3350 17 G PO PACK: 17.0000 g | PACK | Freq: Every day | ORAL | Status: DC | PRN

## 2022-08-19 MED ORDER — HYDROMORPHONE HCL 1 MG/ML IJ SOLN
0.5000 mg | Freq: Four times a day (QID) | INTRAMUSCULAR | Status: DC | PRN
  Administered 2022-08-19 – 2022-08-21 (×5): 1 mg via INTRAVENOUS
  Filled 2022-08-19 (×5): qty 1

## 2022-08-19 MED ORDER — SODIUM CHLORIDE 0.9 % IV BOLUS
1000.0000 mL | Freq: Once | INTRAVENOUS | Status: AC
  Administered 2022-08-19: 1000 mL via INTRAVENOUS

## 2022-08-19 MED ORDER — ESCITALOPRAM OXALATE 10 MG PO TABS
10.0000 mg | ORAL_TABLET | Freq: Every day | ORAL | Status: DC
  Administered 2022-08-20 – 2022-08-21 (×2): 10 mg via ORAL
  Filled 2022-08-19 (×2): qty 1

## 2022-08-19 MED ORDER — ALBUTEROL SULFATE HFA 108 (90 BASE) MCG/ACT IN AERS: 1.0000 | INHALATION_SPRAY | Freq: Four times a day (QID) | RESPIRATORY_TRACT | Status: DC | PRN

## 2022-08-19 MED ORDER — ACETAMINOPHEN 650 MG RE SUPP: 650.0000 mg | Freq: Four times a day (QID) | RECTAL | Status: DC | PRN

## 2022-08-19 MED ORDER — SODIUM CHLORIDE 0.9 % IV SOLN: INTRAVENOUS | Status: AC

## 2022-08-19 MED ORDER — ONDANSETRON 4 MG PO TBDP: 4.0000 mg | ORAL_TABLET | Freq: Three times a day (TID) | ORAL | Status: DC | PRN

## 2022-08-19 MED ORDER — GABAPENTIN 100 MG PO CAPS
100.0000 mg | ORAL_CAPSULE | Freq: Every day | ORAL | Status: DC
  Filled 2022-08-19 (×2): qty 1

## 2022-08-19 MED ORDER — SODIUM CHLORIDE 0.9% FLUSH
3.0000 mL | Freq: Two times a day (BID) | INTRAVENOUS | Status: DC
  Administered 2022-08-19 – 2022-08-21 (×5): 3 mL via INTRAVENOUS

## 2022-08-19 MED ORDER — BACLOFEN 10 MG PO TABS: 20.0000 mg | ORAL_TABLET | Freq: Every day | ORAL | Status: DC | PRN

## 2022-08-19 MED ORDER — LACTATED RINGERS IV BOLUS
1000.0000 mL | Freq: Once | INTRAVENOUS | Status: AC
  Administered 2022-08-19: 1000 mL via INTRAVENOUS

## 2022-08-19 MED ORDER — OXCARBAZEPINE 300 MG PO TABS
600.0000 mg | ORAL_TABLET | Freq: Two times a day (BID) | ORAL | Status: DC
  Administered 2022-08-20 – 2022-08-21 (×2): 600 mg via ORAL
  Filled 2022-08-19 (×6): qty 2

## 2022-08-19 MED ORDER — ACETAMINOPHEN 325 MG PO TABS: 650.0000 mg | ORAL_TABLET | Freq: Four times a day (QID) | ORAL | Status: DC | PRN

## 2022-08-19 MED ORDER — SODIUM CHLORIDE 0.9 % IV SOLN
2.0000 g | Freq: Once | INTRAVENOUS | Status: AC
  Administered 2022-08-19: 2 g via INTRAVENOUS
  Filled 2022-08-19: qty 12.5

## 2022-08-19 NOTE — H&P (Signed)
History and Physical   PROSPERO MAHNKE VOP:[id number] DOB: Feb 21, 1961 DOA: 08/19/2022  PCP: Agustina Caroli, MD   Patient coming from: Home  Chief Complaint: General status, weakness, decreased p.o. intake  HPI: [name] is a 61 y.o. 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  Also with history of depression and recent trigeminal neuralgia presenting with altered mental status.  History obtained with assistance of chart review, family, EMS due to altered mentation.  He has had 4 days of some degree of symptoms.  He has been dealing with significant pain from trigeminal neuralgia and has had decrease p.o. intake due to this.  He was seen 4 days ago in the ED after having the first couple days of decreased appetite and becoming generally weaker than normal.  Concern was for dehydration and he was treated with IV fluids.  He improved initially following this visit and was seen at his neurologist office the next day and was noted to be generally stable other than some mildly reduced strength to 3/5 in his left upper extremity.  Following this patient has continued to decline with worsening mentation, decreased speaking, increased weakness.  At baseline he is wheelchair-bound and quadriplegic with only mild use of his arms as above.  He uses a lift for transfers.  Of note, patient is scheduled to have nerve block to help with his trigeminal neuralgia on November 14.  Family is hoping to get him optimized to be able to make it to this appointment or see if this can be done here.  Also noted is recent constipation for the past 3 to 4 days.  No reports of fevers, chills, chest pain, shortness of breath, abdominal pain, nausea, vomiting per family.  ED Course: Vital signs in the ED showed blood pressure in  the 120 systolic.  Lab work-up included CMP with bicarb of 12, creatinine elevated to 1.37, glucose 124, T. bili 1.4.  CBC with leukocytosis to 11.8 with a left shift.  Troponin negative x2.  Lactic acid negative x2.  CK level mildly low.  TSH pending.  Urinalysis and blood cultures pending.  Chest x-ray showed low lung volumes but no acute abnormality.  Abdominal film showed moderate stool burden but no other acute abnormality.  CT head showed no acute Gershon Mussel.  Patient received Dilaudid, cefepime, 2 L of IV fluids in the ED.  Reportedly he did have some improvement in his strength on the left with IV fluids.  Review of Systems: As per HPI otherwise all other systems reviewed and are negative.  Reviewed with family.  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 He has never used smokeless tobacco. He reports that he does not drink alcohol and does not use drugs.  Allergies  Allergen Reactions   Gadopiclenol Nausea And Vomiting    Family History  Problem Relation Age of Onset   Cancer Father    Multiple sclerosis Sister    Seizures Maternal Uncle    Parkinsonism Maternal Uncle    Multiple sclerosis Sister    Multiple sclerosis Paternal Uncle    Multiple sclerosis Other    Lung cancer Other        parent   Uterine cancer Other        other  Reviewed on admission  Prior to Admission medications   Medication Sig Start Date End Date Taking? Authorizing Provider  acetaminophen (TYLENOL) 325 MG tablet Take 2 tablets (650 mg total) by mouth every 6 (six) hours as needed  for mild pain, fever or moderate pain (or Fever >/= 101). 03/12/22   Azucena Fallen, MD  albuterol (VENTOLIN HFA) 108 (90 Base) MCG/ACT inhaler Inhale 2 puffs into the lungs every 6 (six) hours as needed. Patient taking differently: Inhale 1-2 puffs into the lungs every 6 (six) hours as needed for wheezing. 07/24/21   York Spaniel, MD  Ascorbic Acid (VITAMIN C) 100 MG tablet Take 100 mg by mouth daily.    [provider]  baclofen (LIORESAL) 10 MG tablet 1/2 tablet in the morning and at noon, and 2 tablets at night Patient taking differently: Take 20 mg by mouth daily as needed for muscle spasms. 05/20/16   York Spaniel, MD  Carboxymethylcellulose Sod PF (THERATEARS) 1 % GEL Place 1 drop into both eyes daily as needed (dry eyes).    [provider]  Carboxymethylcellulose Sodium (THERATEARS OP) Place 1 drop into both eyes daily as needed (dry eyes).    [provider]  cholecalciferol (VITAMIN D3) 25 MCG (1000 UNIT) tablet Take 1,000 Units by mouth daily.    [provider]  D-MANNOSE PO Take 1,300 mg by mouth See admin instructions. 1300 mg with cranberry extract for uti    [provider]  Dextromethorphan-Quinidine 20-10 MG CAPS Take 1 tablet by mouth 2 (two) times daily. Patient taking differently: Take 1 capsule by mouth daily. 05/27/17   York Spaniel, MD  escitalopram (LEXAPRO) 10 MG tablet Take 10 mg by mouth daily.    [provider]  gabapentin (NEURONTIN) 100 MG capsule Take 1 capsule (100 mg total) by mouth at bedtime. Patient taking differently: Take 100 mg by mouth daily as needed (pain). 01/20/21   York Spaniel, MD  ibuprofen (ADVIL) 200 MG tablet Take 600 mg by mouth every 6 (six) hours as needed for headache or moderate pain.    [provider]  methenamine (HIPREX) 1 g tablet Take 1 g by mouth 2 (two) times daily with a meal.    [provider]  methocarbamol (ROBAXIN) 500 MG tablet TAKE 1  TABLET BY MOUTH THREE TIMES A DAY 04/21/22   Sater, Pearletha Furl, MD  modafinil (PROVIGIL) 100 MG tablet TAKE 1 TABLET (100 MG TOTAL) BY MOUTH IN THE MORNING. *SENT FOR PA* Patient taking differently: Take 100 mg by mouth daily. 11/29/20   York Spaniel, MD  ocrelizumab 600 mg in sodium chloride 0.9 % 500 mL Inject 600 mg into the vein every 6 (six) months.     [provider]  ondansetron (ZOFRAN-ODT) 4 MG disintegrating tablet 4mg  ODT q4 hours prn nausea/vomit 08/16/22   13/5/23, MD  Oxcarbazepine (TRILEPTAL) 300 MG tablet Take 600 mg by mouth 2 (two) times daily. 05/28/22   [provider]    Physical Exam: Vitals:   08/19/22  1144 08/19/22 1310 08/19/22 1316 08/19/22 1445  BP:  120/76  114/70  Pulse:  90  87  Resp:  (!) 23  (!) 24  Temp: 98.1 F (36.7 C) 97.6 F (36.4 C)    TempSrc: Oral     SpO2:  98% 97% 97%  Weight:      Height:        Physical Exam Constitutional:      General: He is not in acute distress.    Comments: Chronically ill-appearing.  In pain due to his trigeminal neuralgia.  Eyes closed will not talk due to pain, does not appropriately.  HENT:     Head: Normocephalic and atraumatic.     Mouth/Throat:     Mouth: Mucous membranes are moist.     Pharynx: Oropharynx is clear.  Eyes:     Extraocular Movements: Extraocular movements intact.     Pupils: Pupils are equal, round, and reactive to light.  Cardiovascular:     Rate and Rhythm: Normal rate and regular rhythm.     Pulses: Normal pulses.     Heart sounds: Normal heart sounds.  Pulmonary:     Effort: Pulmonary effort is normal. No respiratory distress.     Breath sounds: Normal breath sounds.  Abdominal:     General: Bowel sounds are normal. There is no distension.     Palpations: Abdomen is soft.     Tenderness: There is no abdominal tenderness.  Musculoskeletal:        General: No swelling or deformity.  Skin:    General: Skin is warm and dry.  Neurological:     Comments:  Known spastic quadriplegia.  Left upper extremity 2/5 strength (this is improved from earlier today, but was 3/5 during neurology visit 2 days ago).  Mental status unable to be fully assessed due to him being nonverbal due to pain.  He does nod to answer questions appropriately.    Labs on Admission: I have personally reviewed following labs and imaging studies  CBC: Recent Labs  Lab 08/16/22 0827 08/19/22 0924  WBC 8.2 11.8*  NEUTROABS 6.6 10.7*  HGB 15.3 17.6*  HCT 45.9 51.2  MCV 94.1 93.6  PLT 239 233    Basic Metabolic Panel: Recent Labs  Lab 08/16/22 0827 08/19/22 0924  NA 137 141  K 3.9 4.0  CL 102 109  CO2 17* 12*  GLUCOSE 72 124*  BUN 10 9  CREATININE 0.86 1.37*  CALCIUM 9.5 9.5    GFR: Estimated Creatinine Clearance: 67.1 mL/min (A) (by C-G formula based on SCr of 1.37 mg/dL (H)).  Liver Function Tests: Recent Labs  Lab 08/16/22 0827 08/19/22 0924  AST 8* 14*  ALT 6 10  ALKPHOS 115 117  BILITOT 0.6 1.4*  PROT 7.4 7.3  ALBUMIN 4.9 4.5    Urine analysis:    Component Value Date/Time   COLORURINE YELLOW 08/16/2022 0827   APPEARANCEUR CLEAR 08/16/2022 0827   APPEARANCEUR Clear 07/29/2020 1429   LABSPEC 1.023 08/16/2022 0827   PHURINE 5.5 08/16/2022 0827   GLUCOSEU NEGATIVE 08/16/2022 0827   HGBUR SMALL (A) 08/16/2022 0827   BILIRUBINUR NEGATIVE 08/16/2022 0827   BILIRUBINUR Negative 07/29/2020 1429   KETONESUR >80 (A) 08/16/2022 0827   PROTEINUR 30 (A) 08/16/2022 0827   UROBILINOGEN 0.2 09/12/2018 1534   NITRITE NEGATIVE 08/16/2022 0827   LEUKOCYTESUR NEGATIVE 08/16/2022 0827    Radiological Exams on Admission: CT Head Wo Contrast  Result Date: 08/19/2022 CLINICAL DATA:  Altered mental status  EXAM: CT HEAD WITHOUT CONTRAST TECHNIQUE: Contiguous axial images were obtained from the base of the skull through the vertex without intravenous contrast. RADIATION DOSE REDUCTION: This exam was performed according to the departmental dose-optimization  program which includes automated exposure control, adjustment of the mA and/or kV according to patient size and/or use of iterative reconstruction technique. COMPARISON:  None Available. FINDINGS: Brain: No evidence of acute infarction, hemorrhage, hydrocephalus, extra-axial collection or mass lesion/mass effect. Vascular: No hyperdense vessel or unexpected calcification. Skull: Normal. Negative for fracture or focal lesion. Sinuses/Orbits: No acute finding. Other: None. IMPRESSION: No acute intracranial pathology. Electronically Signed   By: Allegra Lai M.D.   On: 08/19/2022 10:51   DG Abdomen 1 View  Result Date: 08/19/2022 CLINICAL DATA:  Constipation. EXAM: ABDOMEN - 1 VIEW COMPARISON:  None Available. FINDINGS: Moderate stool mainly in the right and transverse colon. The rectum is not included. Mild gaseous distension of the sigmoid colon. Scattered air-filled loops of small bowel without significant distension. No obvious free air. IMPRESSION: Moderate stool in the right and transverse colon. No findings for small bowel obstruction or obvious free air. Electronically Signed   By: Rudie Meyer M.D.   On: 08/19/2022 09:40   DG Chest 1 View  Result Date: 08/19/2022 CLINICAL DATA:  AMS EXAM: CHEST  1 VIEW COMPARISON:  Chest x-ray May 30, 23. FINDINGS: Low lung volumes. No consolidation. No visible pleural effusions or pneumothorax. Cardiomediastinal silhouette is within normal limits. IMPRESSION: Low lung volumes without evidence of acute cardiopulmonary disease. Electronically Signed   By: Feliberto Harts M.D.   On: 08/19/2022 09:38    EKG: Independently reviewed. Sinus rhythm at 92 bpm.  Baseline artifact in multiple leads.  QTc borderline at 486.  Low voltage in multiple leads.  Assessment/Plan Principal Problem:   Acute encephalopathy Active Problems:   Multiple sclerosis, secondary progressive (HCC)   Neurogenic bladder   Spastic quadriparesis (HCC)   Restrictive lung disease    Depression   Acute encephalopathy AKI Leukocytosis > Patient presenting with acute encephalopathy in the setting of decreased mental status and decrease speaking as well as increased weakness in his left arm.  This is similar to how presents with recurrent UTIs which he gets in the setting of bacterial colonization with chronic Foley in place. > Currently he is having issues with p.o. intake due to significant pain from trigeminal neuralgia.  Has been seen by neurology recently and is currently on oxcarbazepine for this. > After initial decreased p.o. intake was seen in the ED 4 days ago and had symptom improvement with IV fluids, and even looked near baseline at neurology visit the following day.  However, since that time due to continued poor p.o. intake he has again worsened and is now altered as above. > Did receive IV fluids in the ED with some improvement in his left upper extremity strength.  Right now suspect encephalopathy is most likely secondary to his dehydration.  Also a consideration is possible UTI as he does have a mild leukocytosis and history of recurrent UTI.  Urinalysis is pending and received a dose of cefepime in the ED.  Lower suspicion for MS exacerbation given he is improved somewhat with IV fluids already, and had recent neurology visit that was largely stable. - Monitor on telemetry - Continue with IV fluids and monitor response - We will start with clear liquid diet, will need improvement in pain and ability to come tolerate p.o. intake, may need neurology input on this  if is not improving versus nerve block as below. - Follow-up urinalysis and add urine cultures - Trend renal function and electrolytes  Multiple sclerosis > Relapsing and remitting secondary progressive.  Follows with Dr. Sherilyn Dacosta.  Currently on Ocrevus as well as baclofen, gabapentin. > Located by spastic quadriplegia, neurogenic bladder with history of recurrent UTIs and sepsis, restrictive lung disease.   Wheelchair-bound and uses a lift for transfers. - Continue home baclofen, gabapentin - Continue as needed albuterol  Trigeminal neuralgia > Continue with home oxcarbazepine, gabapentin if tolerated. > Family has him scheduled for nerve block in [place] on November 14.  Family hope is to optimize him for this appointment versus see if we have the ability to do this here. - Due to pain, clear liquid diet for now, advance as tolerated - If we do not have the ability to do nerve block here and he is not improving may need input from neurology - As needed Dilaudid this evening  Depression - Continue home Lexapro  DVT prophylaxis: Lovenox Code Status:   DNR Family Communication:  Updated at bedside Disposition Plan:   Patient is from:  Home  Anticipated DC to:  Home  Anticipated DC date:  1 to 4 days  Anticipated DC barriers: None  Consults called:  None  Admission status:  Observation, telemetry  Severity of Illness: The appropriate patient status for this patient is OBSERVATION. Observation status is judged to be reasonable and necessary in order to provide the required intensity of service to ensure the patient's safety. The patient's presenting symptoms, physical exam findings, and initial radiographic and laboratory data in the context of their medical condition is felt to place them at decreased risk for further clinical deterioration. Furthermore, it is anticipated that the patient will be medically stable for discharge from the hospital within 2 midnights of admission.    Synetta Fail MD [facility]  How to contact the [facility] Attending or Consulting provider 7A - 7P or covering provider during after hours 7P -7A, for this patient?   Check the care team in [facility] and look for a) attending/consulting [facility] provider listed and b) the [facility] team listed Log into [email or web address] and use [facility] universal password to access. If you do not have the password, please contact  the hospital operator. Locate the [facility] provider you are looking for under [facility] and page to a number that you can be directly reached. If you still have difficulty reaching the provider, please page the [facility] (Director on Call) for the Hospitalists listed on amion for assistance.  08/19/2022, 3:01 PM

## 2022-08-19 NOTE — ED Notes (Signed)
Called lab at this time for labwork. Labs in process.

## 2022-08-19 NOTE — ED Provider Notes (Signed)
[facility] EMERGENCY DEPARTMENT Provider Note   CSN: [id number] Arrival date & time: 08/19/22  0830     History  Chief Complaint  Patient presents with   Altered Mental Status    [name] is a 61 y.o. male, history of MS who presents to the ED secondary to weakness, decreased balance, and increased confusion over the last few days.  History is limited secondary to patient's nonverbal status/limited status, he will nod yes and no, but no other verbal.  EMS states that patient has not had a bowel movement in the last 4 days, has been increasingly weak, and has a new left-sided weakness.  Specifically the left arm.  Denies chest pain. No pain at catheter site.  Wife states catheter changed on 10/25, typically verbal, and can move left arm, all other limbs mostly paralyzed.  States that he is wheelchair-bound, and she uses a lift to get him into the wheelchair, only uses left arm.  Now for the last 4 days, patient has not been able to use left arm.  States that he seen a [facility] 4 days ago, received IVF, and was talking and fine for about 3 hours after discharge, but then started to change, and not talk, eat or drink.  Dates that he has trigeminal neuralgia, and stopped eating or drinking secondary to the pain.  Has not also brushed his teeth in 4 days secondary to the pain.    Home Medications Prior to Admission medications   Medication Sig Start Date End Date Taking? Authorizing Provider  acetaminophen (TYLENOL) 325 MG tablet Take 2 tablets (650 mg total) by mouth every 6 (six) hours as needed for mild pain, fever or moderate pain (or Fever >/= 101). 03/12/22   Azucena Fallen, MD  albuterol (VENTOLIN HFA) 108 (90 Base) MCG/ACT inhaler Inhale 2 puffs into the lungs every 6 (six) hours as needed. Patient taking differently: Inhale 1-2 puffs into the lungs every 6 (six) hours as needed for wheezing. 07/24/21   York Spaniel, MD  Ascorbic Acid (VITAMIN C)  100 MG tablet Take 100 mg by mouth daily.    [provider]  baclofen (LIORESAL) 10 MG tablet 1/2 tablet in the morning and at noon, and 2 tablets at night Patient taking differently: Take 20 mg by mouth daily as needed for muscle spasms. 05/20/16   York Spaniel, MD  Carboxymethylcellulose Sod PF (THERATEARS) 1 % GEL Place 1 drop into both eyes daily as needed (dry eyes).    [provider]  Carboxymethylcellulose Sodium (THERATEARS OP) Place 1 drop into both eyes daily as needed (dry eyes).    [provider]  cholecalciferol (VITAMIN D3) 25 MCG (1000 UNIT) tablet Take 1,000 Units by mouth daily.    [provider]  D-MANNOSE PO Take 1,300 mg by mouth See admin instructions. 1300 mg with cranberry extract for uti    [provider]  Dextromethorphan-Quinidine 20-10 MG CAPS Take 1 tablet by mouth 2 (two) times daily. Patient taking differently: Take 1 capsule by mouth daily. 05/27/17   York Spaniel, MD  escitalopram (LEXAPRO) 10 MG tablet Take 10 mg by mouth daily.    [provider]  gabapentin (NEURONTIN) 100 MG capsule Take 1 capsule (100 mg total) by mouth at bedtime. Patient taking differently: Take 100 mg by mouth daily as needed (pain). 01/20/21   York Spaniel, MD  ibuprofen (ADVIL) 200 MG tablet Take 600 mg by mouth every 6 (  six) hours as needed for headache or moderate pain.    [provider]  methenamine (HIPREX) 1 g tablet Take 1 g by mouth 2 (two) times daily with a meal.    [provider]  methocarbamol (ROBAXIN) 500 MG tablet TAKE 1 TABLET BY MOUTH THREE TIMES A DAY 04/21/22   Sater, Pearletha Furl, MD  modafinil (PROVIGIL) 100 MG tablet TAKE 1 TABLET (100 MG TOTAL) BY MOUTH IN THE MORNING. *SENT FOR PA* Patient taking differently: Take 100 mg by mouth daily. 11/29/20   York Spaniel, MD  ocrelizumab 600 mg in sodium chloride 0.9 % 500 mL Inject 600 mg into the vein every 6 (six) months.     [provider]  ondansetron (ZOFRAN-ODT) 4 MG disintegrating tablet 4mg  ODT q4 hours prn nausea/vomit 08/16/22   13/5/23, MD  Oxcarbazepine (TRILEPTAL) 300 MG tablet Take 600 mg by mouth 2 (two) times daily. 05/28/22   [provider]      Allergies    Gadopiclenol    Review of Systems   Review of Systems  Physical Exam Updated Vital Signs BP 114/70   Pulse 87   Temp 97.6 F (36.4 C)   Resp (!) 24   Ht 5\' 10"  (1.778 m)   Wt 94.8 kg   SpO2 97%   BMI 29.99 kg/m  Physical Exam Constitutional:      Appearance: He is ill-appearing.  HENT:     Head: Normocephalic.     Nose: Nose normal.     Mouth/Throat:     Mouth: Mucous membranes are dry.  Eyes:     Extraocular Movements: Extraocular movements intact.     Pupils: Pupils are equal, round, and reactive to light.  Cardiovascular:     Rate and Rhythm: Normal rate and regular rhythm.     Pulses: Normal pulses.  Pulmonary:     Effort: Pulmonary effort is normal.     Breath sounds: Normal breath sounds.  Abdominal:     General: Abdomen is flat.     Palpations: Abdomen is soft.  Musculoskeletal:     Cervical back: Normal range of motion.     Comments: 1/5 strength of RUE, BLE. 2/5  strength of LUE.   Neurological:     Mental Status: He is alert.     Comments: No facial droop. Nonverbal. Responds with head nods.      ED Results / Procedures / Treatments   Labs (all labs ordered are listed, but only abnormal results are displayed) Labs Reviewed  COMPREHENSIVE METABOLIC PANEL - Abnormal; Notable for the following components:      Result Value   CO2 12 (*)    Glucose, Bld 124 (*)    Creatinine, Ser 1.37 (*)    AST 14 (*)    Total Bilirubin 1.4 (*)    GFR, Estimated 59 (*)    Anion gap 20 (*)    All other components within normal limits  CBC WITH DIFFERENTIAL/PLATELET - Abnormal; Notable for the following components:   WBC 11.8 (*)    Hemoglobin 17.6 (*)    Neutro Abs 10.7 (*)    Lymphs Abs 0.5 (*)     All other components within normal limits  URINALYSIS, ROUTINE W REFLEX MICROSCOPIC - Abnormal; Notable for the following components:   Hgb urine dipstick MODERATE (*)    Ketones, ur 80 (*)    Protein, ur 100 (*)    Bacteria, UA RARE (*)    All  other components within normal limits  CK - Abnormal; Notable for the following components:   Total CK 20 (*)    All other components within normal limits  CULTURE, BLOOD (ROUTINE X 2)  CULTURE, BLOOD (ROUTINE X 2)  URINE CULTURE  LACTIC ACID, PLASMA  LACTIC ACID, PLASMA  TSH  TROPONIN I (HIGH SENSITIVITY)  TROPONIN I (HIGH SENSITIVITY)    EKG EKG Interpretation  Date/Time:  Wednesday August 19 2022 09:00:45 EST Ventricular Rate:  92 PR Interval:  175 QRS Duration: 94 QT Interval:  390 QTC Calculation: 486 R Axis:   -24 Text Interpretation: Sinus rhythm Borderline left axis deviation Abnormal R-wave progression, early transition Borderline prolonged QT interval Confirmed by Vivien Rossetti ([id number]) on 08/19/2022 9:47:26 AM  Radiology CT Head Wo Contrast  Result Date: 08/19/2022 CLINICAL DATA:  Altered mental status EXAM: CT HEAD WITHOUT CONTRAST TECHNIQUE: Contiguous axial images were obtained from the base of the skull through the vertex without intravenous contrast. RADIATION DOSE REDUCTION: This exam was performed according to the departmental dose-optimization program which includes automated exposure control, adjustment of the mA and/or kV according to patient size and/or use of iterative reconstruction technique. COMPARISON:  None Available. FINDINGS: Brain: No evidence of acute infarction, hemorrhage, hydrocephalus, extra-axial collection or mass lesion/mass effect. Vascular: No hyperdense vessel or unexpected calcification. Skull: Normal. Negative for fracture or focal lesion. Sinuses/Orbits: No acute finding. Other: None. IMPRESSION: No acute intracranial pathology. Electronically Signed   By: Allegra Lai M.D.   On:  08/19/2022 10:51   DG Abdomen 1 View  Result Date: 08/19/2022 CLINICAL DATA:  Constipation. EXAM: ABDOMEN - 1 VIEW COMPARISON:  None Available. FINDINGS: Moderate stool mainly in the right and transverse colon. The rectum is not included. Mild gaseous distension of the sigmoid colon. Scattered air-filled loops of Lidie Glade bowel without significant distension. No obvious free air. IMPRESSION: Moderate stool in the right and transverse colon. No findings for Alexiana Laverdure bowel obstruction or obvious free air. Electronically Signed   By: Rudie Meyer M.D.   On: 08/19/2022 09:40   DG Chest 1 View  Result Date: 08/19/2022 CLINICAL DATA:  AMS EXAM: CHEST  1 VIEW COMPARISON:  Chest x-ray May 30, 23. FINDINGS: Low lung volumes. No consolidation. No visible pleural effusions or pneumothorax. Cardiomediastinal silhouette is within normal limits. IMPRESSION: Low lung volumes without evidence of acute cardiopulmonary disease. Electronically Signed   By: Feliberto Harts M.D.   On: 08/19/2022 09:38    Procedures Procedures    Medications Ordered in ED Medications  escitalopram (LEXAPRO) tablet 10 mg (has no administration in time range)  ondansetron (ZOFRAN-ODT) disintegrating tablet 4 mg (has no administration in time range)  baclofen (LIORESAL) tablet 20 mg (has no administration in time range)  gabapentin (NEURONTIN) capsule 100 mg (has no administration in time range)  Oxcarbazepine (TRILEPTAL) tablet 600 mg (has no administration in time range)  Carboxymethylcellulose Sod PF 1 % GEL 1 drop (has no administration in time range)  enoxaparin (LOVENOX) injection 40 mg (has no administration in time range)  sodium chloride flush (NS) 0.9 % injection 3 mL (has no administration in time range)  0.9 %  sodium chloride infusion (has no administration in time range)  acetaminophen (TYLENOL) tablet 650 mg (has no administration in time range)    Or  acetaminophen (TYLENOL) suppository 650 mg (has no administration  in time range)  polyethylene glycol (MIRALAX / GLYCOLAX) packet 17 g (has no administration in time range)  albuterol (PROVENTIL) (2.5 MG/3ML) 0.083%  nebulizer solution 2.5 mg (has no administration in time range)  sodium chloride 0.9 % bolus 1,000 mL (0 mLs Intravenous Stopped 08/19/22 1130)  ceFEPIme (MAXIPIME) 2 g in sodium chloride 0.9 % 100 mL IVPB (0 g Intravenous Stopped 08/19/22 1022)  HYDROmorphone (DILAUDID) injection 1 mg (1 mg Intravenous Given 08/19/22 1008)  lactated ringers bolus 1,000 mL (1,000 mLs Intravenous New Bag/Given 08/19/22 1304)    ED Course/ Medical Decision Making/ A&P                           Medical Decision Making Patient is a 61 year old male, here for altered mental status, increased weakness.  Has not been eating or drinking for the last 4 days, believes secondary to trigeminal neuralgia pain.  States was seen at [facility] a few days ago with some improvement of symptoms.  For now not eating or drinking in the past 4 days.  Will obtain basic labs, CT head given new weakness.    Amount and/or Complexity of Data Reviewed Labs: ordered.    Details: Labs significant for mild dehydration, AKI of with a creatinine of 1.37, baseline is typically 0.8-ish. Radiology: ordered.    Details: No acute findings on CT head. Discussion of management or test interpretation with external provider(s): Discussed with hospitalist, will admit, secondary to AKI, altered mental status dehydration.  After giving 2 boluses of fluid, patient is improving, he is moving his left arm more.  Discussed with hospitalist MRI versus no MRI, they will hold at this time.  Recommend treatment of his pain, and further evaluation of his increased weakness.  Continue giving IV fluids.  Risk Prescription drug management. Decision regarding hospitalization.    Final Clinical Impression(s) / ED Diagnoses Final diagnoses:  Dehydration  Altered mental status, unspecified altered mental status type   AKI (acute kidney injury) [facility])    Rx / DC Orders ED Discharge Orders     None         Torey Regan, [place]ley [place], PA 08/19/22 1521    Mardene Sayer, MD 08/20/22 1819

## 2022-08-19 NOTE — ED Triage Notes (Signed)
Pt BIB [facility] to [facility]. Pt been c/o in left arm. EMS reported he has been have decrease po intake for 5 days. He has been having weakness in the left arm for 4 day and  not been able to left arm up. Pt has Foley, which wife said it was change August 05, 2022. Baseline pt verbalize and can move arms.

## 2022-08-20 ENCOUNTER — Encounter: Payer: Self-pay | Admitting: *Deleted

## 2022-08-20 DIAGNOSIS — Z6830 Body mass index (BMI) 30.0-30.9, adult: Secondary | ICD-10-CM | POA: Diagnosis not present

## 2022-08-20 DIAGNOSIS — N319 Neuromuscular dysfunction of bladder, unspecified: Secondary | ICD-10-CM | POA: Diagnosis present

## 2022-08-20 DIAGNOSIS — E8721 Acute metabolic acidosis: Secondary | ICD-10-CM | POA: Diagnosis present

## 2022-08-20 DIAGNOSIS — G9341 Metabolic encephalopathy: Secondary | ICD-10-CM | POA: Diagnosis present

## 2022-08-20 DIAGNOSIS — J984 Other disorders of lung: Secondary | ICD-10-CM | POA: Diagnosis present

## 2022-08-20 DIAGNOSIS — Z87891 Personal history of nicotine dependence: Secondary | ICD-10-CM | POA: Diagnosis not present

## 2022-08-20 DIAGNOSIS — E876 Hypokalemia: Secondary | ICD-10-CM

## 2022-08-20 DIAGNOSIS — E669 Obesity, unspecified: Secondary | ICD-10-CM | POA: Diagnosis present

## 2022-08-20 DIAGNOSIS — F32A Depression, unspecified: Secondary | ICD-10-CM | POA: Diagnosis present

## 2022-08-20 DIAGNOSIS — Z87440 Personal history of urinary (tract) infections: Secondary | ICD-10-CM | POA: Diagnosis not present

## 2022-08-20 DIAGNOSIS — G35 Multiple sclerosis: Secondary | ICD-10-CM | POA: Diagnosis present

## 2022-08-20 DIAGNOSIS — Z820 Family history of epilepsy and other diseases of the nervous system: Secondary | ICD-10-CM | POA: Diagnosis not present

## 2022-08-20 DIAGNOSIS — L8990 Pressure ulcer of unspecified site, unspecified stage: Secondary | ICD-10-CM | POA: Insufficient documentation

## 2022-08-20 DIAGNOSIS — G8250 Quadriplegia, unspecified: Secondary | ICD-10-CM | POA: Diagnosis present

## 2022-08-20 DIAGNOSIS — K5900 Constipation, unspecified: Secondary | ICD-10-CM | POA: Diagnosis present

## 2022-08-20 DIAGNOSIS — Z79899 Other long term (current) drug therapy: Secondary | ICD-10-CM | POA: Diagnosis not present

## 2022-08-20 DIAGNOSIS — E860 Dehydration: Secondary | ICD-10-CM | POA: Diagnosis present

## 2022-08-20 DIAGNOSIS — D72829 Elevated white blood cell count, unspecified: Secondary | ICD-10-CM | POA: Diagnosis present

## 2022-08-20 DIAGNOSIS — Z888 Allergy status to other drugs, medicaments and biological substances status: Secondary | ICD-10-CM | POA: Diagnosis not present

## 2022-08-20 DIAGNOSIS — Z993 Dependence on wheelchair: Secondary | ICD-10-CM | POA: Diagnosis not present

## 2022-08-20 DIAGNOSIS — G9340 Encephalopathy, unspecified: Secondary | ICD-10-CM | POA: Diagnosis not present

## 2022-08-20 DIAGNOSIS — N179 Acute kidney failure, unspecified: Secondary | ICD-10-CM | POA: Diagnosis present

## 2022-08-20 DIAGNOSIS — G500 Trigeminal neuralgia: Secondary | ICD-10-CM | POA: Diagnosis present

## 2022-08-20 DIAGNOSIS — L89312 Pressure ulcer of right buttock, stage 2: Secondary | ICD-10-CM | POA: Diagnosis present

## 2022-08-20 DIAGNOSIS — Z66 Do not resuscitate: Secondary | ICD-10-CM | POA: Diagnosis present

## 2022-08-20 LAB — COMPREHENSIVE METABOLIC PANEL
ALT: 7 U/L (ref 0–44)
AST: 13 U/L — ABNORMAL LOW (ref 15–41)
Albumin: 3.3 g/dL — ABNORMAL LOW (ref 3.5–5.0)
Alkaline Phosphatase: 84 U/L (ref 38–126)
Anion gap: 10 (ref 5–15)
BUN: 7 mg/dL (ref 6–20)
CO2: 16 mmol/L — ABNORMAL LOW (ref 22–32)
Calcium: 8.7 mg/dL — ABNORMAL LOW (ref 8.9–10.3)
Chloride: 116 mmol/L — ABNORMAL HIGH (ref 98–111)
Creatinine, Ser: 1.03 mg/dL (ref 0.61–1.24)
GFR, Estimated: >60 mL/min (ref >60)
Glucose, Bld: 98 mg/dL (ref 70–99)
Potassium: 3.3 mmol/L — ABNORMAL LOW (ref 3.5–5.1)
Sodium: 142 mmol/L (ref 135–145)
Total Bilirubin: 1 mg/dL (ref 0.3–1.2)
Total Protein: 5.3 g/dL — ABNORMAL LOW (ref 6.5–8.1)

## 2022-08-20 LAB — CBC
HCT: 41.3 % (ref 39.0–52.0)
Hemoglobin: 13.7 g/dL (ref 13.0–17.0)
MCH: 31.3 pg (ref 26.0–34.0)
MCHC: 33.2 g/dL (ref 30.0–36.0)
MCV: 94.3 fL (ref 80.0–100.0)
Platelets: 222 10*3/uL (ref 150–400)
RBC: 4.38 MIL/uL (ref 4.22–5.81)
RDW: 14 % (ref 11.5–15.5)
WBC: 7.8 10*3/uL (ref 4.0–10.5)
nRBC: 0 % (ref 0.0–0.2)

## 2022-08-20 MED ORDER — POTASSIUM CHLORIDE 10 MEQ/100ML IV SOLN
10.0000 meq | INTRAVENOUS | Status: AC
  Administered 2022-08-20 – 2022-08-21 (×4): 10 meq via INTRAVENOUS
  Filled 2022-08-20 (×3): qty 100

## 2022-08-20 MED ORDER — POTASSIUM CHLORIDE CRYS ER 20 MEQ PO TBCR: 40.0000 meq | EXTENDED_RELEASE_TABLET | Freq: Once | ORAL | Status: DC

## 2022-08-20 MED ORDER — SODIUM CHLORIDE 0.9 % IV SOLN: INTRAVENOUS | Status: AC

## 2022-08-20 NOTE — Evaluation (Signed)
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  Also with history of depression and recent trigeminal neuralgia presented on 08/19/22 with altered mental status.     History obtained with assistance of chart review, family, EMS due to altered mentation.  He has had 4 days of some degree of symptoms.  He has been dealing with significant pain from trigeminal neuralgia and has had decrease p.o. intake due to this.  He was seen 4 days ago in the ED after having the first couple days of decreased appetite and becoming generally weaker than normal.  Concern was for dehydration and he was treated with IV fluids.  He improved initially following this visit and was seen at his neurologist office the next day and was noted to be generally stable other than some mildly reduced strength to 3/5 in his left upper extremity.     Following this patient has continued to decline with worsening mentation, decreased speaking, increased weakness.  At baseline he is wheelchair-bound and  quadriplegic with only mild use of his arms as above.  He uses a lift for transfers.     Of note, patient is scheduled to have nerve block to help with his trigeminal neuralgia on November 14.  Family is hoping to get him optimized to be able to make it to this appointment or see if this can be done here; CXR on 08/19/22 negative for acute changes; BSE generated d/t c/o dysphagia.    Assessment / Plan / Recommendation  Clinical Impression  Pt seen for limited BSE d/t severe odynophagia seondary to trigeminal neuralgia affecting R facial area and pharynx per pt report and witnessed by grimacing.  Pt consumed sip of room temperature thin liquid via straw placement on L to limit discomfort/pain with oral holding and delay with initiation of the swallow noted.  An audible swallow and grimacing during intake observed.   No overt s/sx of aspiration noted, but consumption of POs limited.  Pt reports severe odynophagia which is impacting overall swallow function.  Pt refused any further intake and oral care completion d/t severe facial/pharyngeal pain.  Pt denotes room temperature liquids are easiest to consume and limit odynophagia during swallowing.  Speech pattern slow and deliberate during speaking attempts and overall deconditioning noted with PO intake.  Recommend continue liquid diet with small sips to minimize pain/dysphagic symptoms with medications provided via IV if possible or within puree to assist with transition into pharynx.  ST will f/u for diet tolerance and advancement as pt able.  Objective assessment may be warranted at a later date prn if swallowing function continues to decline.  Thank you for this consult. SLP Visit Diagnosis: Dysphagia, oropharyngeal phase (R13.12)    Aspiration Risk  Mild aspiration risk;Risk for inadequate nutrition/hydration    Diet Recommendation   Clear liquids (per pt preference) with small sips  Medication Administration: Via alternative means (or crushed with  puree prn)    Other  Recommendations Oral Care Recommendations: Oral care BID;Staff/trained caregiver to provide oral care;Other (Comment) (per pt preference d/t pain)    Recommendations for follow up therapy are one component of a multi-disciplinary discharge planning process, led by the attending physician.  Recommendations may be updated based on patient status, additional functional criteria and insurance authorization.  Follow up Recommendations Follow physician's recommendations for discharge plan and follow up therapies      Assistance Recommended at Discharge Frequent or constant Supervision/Assistance  Functional Status Assessment Patient has had a recent decline in their functional status and demonstrates the ability to make significant improvements in function in a reasonable and predictable amount of time.  Frequency and Duration min 1 x/week  1 week       Prognosis Prognosis for Safe Diet Advancement: Good Barriers to Reach Goals: Severity of deficits      Swallow Study   General Date of Onset: 08/19/22 HPI: [name] is a 61 y.o. 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  Also with history of depression and recent trigeminal neuralgia presented on 08/19/22 with altered mental status.     History obtained with assistance of chart review, family, EMS due to altered mentation.  He has had 4 days of some degree of symptoms.  He has been dealing with significant pain from trigeminal neuralgia and has had decrease p.o. intake due to this.  He was seen 4 days ago in the ED after having the first couple days of decreased appetite and becoming generally weaker than normal.  Concern was for dehydration and he was treated with IV fluids.  He improved initially following this visit and was seen  at his neurologist office the next day and was noted to be generally stable other than some mildly reduced strength to 3/5 in his left upper extremity.     Following this patient has continued to decline with worsening mentation, decreased speaking, increased weakness.  At baseline he is wheelchair-bound and quadriplegic with only mild use of his arms as above.  He uses a lift for transfers.     Of note, patient is scheduled to have nerve block to help with his trigeminal neuralgia on November 14.  Family is hoping to get him optimized to be able to make it to this appointment or see if this can be done here; BSE generated d/t c/o dysphagia. Type of Study: Bedside Swallow Evaluation Previous Swallow Assessment: n/a Diet Prior to this Study: Thin liquids (limited PO intake d/t odynophagia) Temperature Spikes Noted: No Respiratory Status: Room air History of Recent Intubation: No Behavior/Cognition: Alert;Cooperative Oral Cavity Assessment: Within Functional Limits Oral Care Completed by SLP: Other (Comment) (Pt refused d/t trigeminal neuralgia) Oral Cavity - Dentition: Adequate natural dentition Self-Feeding Abilities: Total assist Patient Positioning: Upright in bed Baseline Vocal Quality: Low vocal intensity;Other (comment) (deliberate speech pattern) Volitional Cough: Weak Volitional Swallow: Unable to elicit (d/t pain)    Oral/Motor/Sensory Function Overall Oral Motor/Sensory Function:  Other (comment) (limited d/t trigeminal neuralgia; appears grossly WFL, although speech deliberate/slow/decreased intelligibility)   Ice Chips Ice chips: Not tested (pt refused)   Thin Liquid Thin Liquid: Impaired Presentation: Straw Oral Phase Functional Implications: Oral holding;Other (comment) (likely d/t pain) Pharyngeal  Phase Impairments: Suspected delayed Swallow Other Comments: audible swallow, delay d/t odynophagia; unable to assess subsequent swallows d/t pain/pt refusal    Nectar Thick Nectar  Thick Liquid: Not tested   Honey Thick Honey Thick Liquid: Not tested   Puree Puree: Not tested   Solid     Solid: Not tested      Elvina Sidle, M.S., CCC-SLP 08/20/2022,12:20 PM

## 2022-08-20 NOTE — ED Notes (Signed)
ED TO INPATIENT HANDOFF REPORT  ED Nurse Name and Phone #: [phone]  S Name/Age/Gender [name] 61 y.o. male Room/Bed: 036C/036C  Code Status   Code Status: DNR  Home/SNF/Other Home Patient oriented to: self, place, time, and situation Is this baseline? No   Triage Complete: Triage complete  Chief Complaint Acute encephalopathy [G93.40] Multiple sclerosis (HCC) [G35]  Triage Note Pt BIB [facility] to [facility]. Pt been c/o in left arm. EMS reported he has been have decrease po intake for 5 days. He has been having weakness in the left arm for 4 day and  not been able to left arm up. Pt has Foley, which wife said it was change August 05, 2022. Baseline pt verbalize and can move arms.   Allergies Allergies  Allergen Reactions   Gadopiclenol Nausea And Vomiting    Level of Care/Admitting Diagnosis ED Disposition     ED Disposition  Admit   Condition  --   Comment  Hospital Area: [facility] [[id number]]  Level of Care: Med-Surg [16]  May admit patient to [facility] or [facility] if equivalent level of care is available:: No  Covid Evaluation: Asymptomatic - no recent exposure (last 10 days) testing not required  Diagnosis: Multiple sclerosis (HCC) [340.ICD-9-CM]  Admitting Physician: Osvaldo Shipper [[id number]]  Attending Physician: Osvaldo Shipper [[id number]]  Certification:: I certify this patient will need inpatient services for at least 2 midnights  Estimated Length of Stay: 3          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   Trigeminal neuralgia of right side of face    Past Surgical History:  Procedure Laterality Date   eye surgeries     x 2; bilateral 74 and 76     A IV  Location/Drains/Wounds Patient Lines/Drains/Airways Status     Active Line/Drains/Airways     Name Placement date Placement time Site Days   Peripheral IV 08/16/22 20 G 1.88" Anterior;Left;Proximal Forearm 08/16/22  0850  Forearm  4   Peripheral IV 08/19/22 22 G Posterior;Right Hand 08/19/22  0948  Hand  1   Peripheral IV 08/19/22 20 G 1" Left Antecubital 08/19/22  0920  Antecubital  1   Urethral Catheter Non-latex;Double-lumen 16 Fr. 07/07/22  1809  Non-latex;Double-lumen  44   Urethral Catheter Lovie Macadamia, RN Non-latex 16 Fr. 08/19/22  1000  Non-latex  1   Wound / Incision (Open or Dehisced) 03/11/22 (ITD) Intertriginous Dermatitis Pretibial Distal;Left;Right 03/11/22  2000  Pretibial  162            Intake/Output Last 24 hours  Intake/Output Summary (Last 24 hours) at 08/20/2022 1243 Last data filed at 08/20/2022 0650 Gross per 24 hour  Intake 2000.1 ml  Output 1500 ml  Net 500.1 ml    Labs/Imaging Results for orders placed or performed during the hospital encounter of 08/19/22 (from the past 48 hour(s))  Troponin I (High Sensitivity)     Status: None   Collection Time: 08/19/22  9:24 AM  Result Value Ref Range   Troponin I (High Sensitivity) 4 <18 ng/L    Comment: (NOTE) Elevated high sensitivity troponin I (hsTnI) values and significant  changes across  serial measurements may suggest ACS but many other  chronic and acute conditions are known to elevate hsTnI results.  Refer to the Links section for chest pain algorithms and additional  guidance. Performed at [facility], [address], [place], [place] [address]   Comprehensive metabolic panel     Status: Abnormal   Collection Time: 08/19/22  9:24 AM  Result Value Ref Range   Sodium 141 135 - 145 mmol/L   Potassium 4.0 3.5 - 5.1 mmol/L   Chloride 109 98 - 111 mmol/L   CO2 12 (L) 22 - 32 mmol/L   Glucose, Bld 124 (H) 70 - 99 mg/dL    Comment: Glucose reference range applies only to samples taken  after fasting for at least 8 hours.   BUN 9 6 - 20 mg/dL   Creatinine, Ser 1.49 (H) 0.61 - 1.24 mg/dL   Calcium 9.5 8.9 - 70.2 mg/dL   Total Protein 7.3 6.5 - 8.1 g/dL   Albumin 4.5 3.5 - 5.0 g/dL   AST 14 (L) 15 - 41 U/L   ALT 10 0 - 44 U/L   Alkaline Phosphatase 117 38 - 126 U/L   Total Bilirubin 1.4 (H) 0.3 - 1.2 mg/dL   GFR, Estimated 59 (L) >60 mL/min    Comment: (NOTE) Calculated using the CKD-EPI Creatinine Equation (2021)    Anion gap 20 (H) 5 - 15    Comment: Performed at [facility], [address] [place], [place] [address]  CBC with Differential     Status: Abnormal   Collection Time: 08/19/22  9:24 AM  Result Value Ref Range   WBC 11.8 (H) 4.0 - 10.5 K/uL   RBC 5.47 4.22 - 5.81 MIL/uL   Hemoglobin 17.6 (H) 13.0 - 17.0 g/dL   HCT 88.5 02.7 - 74.1 %   MCV 93.6 80.0 - 100.0 fL   MCH 32.2 26.0 - 34.0 pg   MCHC 34.4 30.0 - 36.0 g/dL   RDW 28.7 86.7 - 67.2 %   Platelets 233 150 - 400 K/uL    Comment: REPEATED TO VERIFY   nRBC 0.0 0.0 - 0.2 %   Neutrophils Relative % 92 %   Neutro Abs 10.7 (H) 1.7 - 7.7 K/uL   Lymphocytes Relative 4 %   Lymphs Abs 0.5 (L) 0.7 - 4.0 K/uL   Monocytes Relative 4 %   Monocytes Absolute 0.5 0.1 - 1.0 K/uL   Eosinophils Relative 0 %   Eosinophils Absolute 0.0 0.0 - 0.5 K/uL   Basophils Relative 0 %   Basophils Absolute 0.0 0.0 - 0.1 K/uL   Immature Granulocytes 0 %   Abs Immature Granulocytes 0.05 0.00 - 0.07 K/uL    Comment: Performed at [facility], [address] [place], [place] [address]  Lactic acid, plasma     Status: None   Collection Time: 08/19/22  9:24 AM  Result Value Ref Range   Lactic Acid, Venous 1.8 0.5 - 1.9 mmol/L    Comment: Performed at [facility], [address] [place], [place] [address]  CK     Status: Abnormal   Collection Time: 08/19/22  9:24 AM  Result Value Ref Range   Total CK 20 (L) 49 - 397 U/L    Comment: Performed at [facility], [address], [place], [place]  [address]  TSH     Status: None   Collection Time: 08/19/22  9:24 AM  Result Value  Ref Range   TSH 0.746 0.350 - 4.500 uIU/mL    Comment: Performed by a 3rd Generation assay with a functional sensitivity of <=0.01 uIU/mL. Performed at [facility], [address] [place], [place] [address]   Lactic acid, plasma     Status: None   Collection Time: 08/19/22 11:35 AM  Result Value Ref Range   Lactic Acid, Venous 1.4 0.5 - 1.9 mmol/L    Comment: Performed at [facility], [address] [place], [place] [address]  Troponin I (High Sensitivity)     Status: None   Collection Time: 08/19/22 11:35 AM  Result Value Ref Range   Troponin I (High Sensitivity) 5 <18 ng/L    Comment: (NOTE) Elevated high sensitivity troponin I (hsTnI) values and significant  changes across serial measurements may suggest ACS but many other  chronic and acute conditions are known to elevate hsTnI results.  Refer to the "Links" section for chest pain algorithms and additional  guidance. Performed at [facility], [address] [place], [place] [address]   Urinalysis, Routine w reflex microscopic Urine, Catheterized     Status: Abnormal   Collection Time: 08/19/22  2:09 PM  Result Value Ref Range   Color, Urine YELLOW YELLOW   APPearance CLEAR CLEAR   Specific Gravity, Urine 1.019 1.005 - 1.030   pH 5.0 5.0 - 8.0   Glucose, UA NEGATIVE NEGATIVE mg/dL   Hgb urine dipstick MODERATE (A) NEGATIVE   Bilirubin Urine NEGATIVE NEGATIVE   Ketones, ur 80 (A) NEGATIVE mg/dL   Protein, ur 914 (A) NEGATIVE mg/dL   Nitrite NEGATIVE NEGATIVE   Leukocytes,Ua NEGATIVE NEGATIVE   RBC / HPF 11-20 0 - 5 RBC/hpf   WBC, UA 0-5 0 - 5 WBC/hpf   Bacteria, UA RARE (A) NONE SEEN   Squamous Epithelial / LPF 0-5 0 - 5   Mucus PRESENT     Comment: Performed at [facility], [address], [place], [place] [address]  Comprehensive metabolic panel     Status: Abnormal   Collection Time: 08/20/22  4:45 AM   Result Value Ref Range   Sodium 142 135 - 145 mmol/L   Potassium 3.3 (L) 3.5 - 5.1 mmol/L   Chloride 116 (H) 98 - 111 mmol/L   CO2 16 (L) 22 - 32 mmol/L   Glucose, Bld 98 70 - 99 mg/dL    Comment: Glucose reference range applies only to samples taken after fasting for at least 8 hours.   BUN 7 6 - 20 mg/dL   Creatinine, Ser 6.21 0.61 - 1.24 mg/dL   Calcium 8.7 (L) 8.9 - 10.3 mg/dL   Total Protein 5.3 (L) 6.5 - 8.1 g/dL   Albumin 3.3 (L) 3.5 - 5.0 g/dL   AST 13 (L) 15 - 41 U/L   ALT 7 0 - 44 U/L   Alkaline Phosphatase 84 38 - 126 U/L   Total Bilirubin 1.0 0.3 - 1.2 mg/dL   GFR, Estimated >30 >86 mL/min    Comment: (NOTE) Calculated using the CKD-EPI Creatinine Equation (2021)    Anion gap 10 5 - 15    Comment: Performed at [facility], [address], [place], [place] [address]  CBC     Status: None   Collection Time: 08/20/22  4:45 AM  Result Value Ref Range   WBC 7.8 4.0 - 10.5 K/uL   RBC 4.38 4.22 - 5.81 MIL/uL   Hemoglobin 13.7 13.0 -  17.0 g/dL   HCT 40.9 81.1 - 91.4 %   MCV 94.3 80.0 - 100.0 fL   MCH 31.3 26.0 - 34.0 pg   MCHC 33.2 30.0 - 36.0 g/dL   RDW 78.2 95.6 - 21.3 %   Platelets 222 150 - 400 K/uL   nRBC 0.0 0.0 - 0.2 %    Comment: Performed at [facility], [address] [place], [place] [address]   CT Head Wo Contrast  Result Date: 08/19/2022 CLINICAL DATA:  Altered mental status EXAM: CT HEAD WITHOUT CONTRAST TECHNIQUE: Contiguous axial images were obtained from the base of the skull through the vertex without intravenous contrast. RADIATION DOSE REDUCTION: This exam was performed according to the departmental dose-optimization program which includes automated exposure control, adjustment of the mA and/or kV according to patient size and/or use of iterative reconstruction technique. COMPARISON:  None Available. FINDINGS: Brain: No evidence of acute infarction, hemorrhage, hydrocephalus, extra-axial collection or mass lesion/mass effect. Vascular:  No hyperdense vessel or unexpected calcification. Skull: Normal. Negative for fracture or focal lesion. Sinuses/Orbits: No acute finding. Other: None. IMPRESSION: No acute intracranial pathology. Electronically Signed   By: Allegra Lai M.D.   On: 08/19/2022 10:51   DG Abdomen 1 View  Result Date: 08/19/2022 CLINICAL DATA:  Constipation. EXAM: ABDOMEN - 1 VIEW COMPARISON:  None Available. FINDINGS: Moderate stool mainly in the right and transverse colon. The rectum is not included. Mild gaseous distension of the sigmoid colon. Scattered air-filled loops of small bowel without significant distension. No obvious free air. IMPRESSION: Moderate stool in the right and transverse colon. No findings for small bowel obstruction or obvious free air. Electronically Signed   By: Rudie Meyer M.D.   On: 08/19/2022 09:40   DG Chest 1 View  Result Date: 08/19/2022 CLINICAL DATA:  AMS EXAM: CHEST  1 VIEW COMPARISON:  Chest x-ray May 30, 23. FINDINGS: Low lung volumes. No consolidation. No visible pleural effusions or pneumothorax. Cardiomediastinal silhouette is within normal limits. IMPRESSION: Low lung volumes without evidence of acute cardiopulmonary disease. Electronically Signed   By: Feliberto Harts M.D.   On: 08/19/2022 09:38    Pending Labs Unresulted Labs (From admission, onward)     Start     Ordered   08/26/22 0500  Creatinine, serum  (enoxaparin (LOVENOX)    CrCl >/= 30 ml/min)  Weekly,   R     Comments: while on enoxaparin therapy    08/19/22 1500   08/21/22 0500  CBC  Tomorrow morning,   R        08/20/22 0934   08/21/22 0500  Basic metabolic panel  Tomorrow morning,   R        08/20/22 0934   08/21/22 0500  Magnesium  Tomorrow morning,   R        08/20/22 0934   08/19/22 1512  Culture, Urine (Do not remove urinary catheter, catheter placed by urology or difficult to place)  (Urine Culture)  Add-on,   AD       Question:  Indication  Answer:  Altered mental status (if no other cause  identified)   08/19/22 1511   08/19/22 0853  Blood culture (routine x 2)  BLOOD CULTURE X 2,   R      08/19/22 0853            Vitals/Pain Today's Vitals   08/20/22 1000 08/20/22 1017 08/20/22 1100 08/20/22 1140  BP: 107/64  106/63   Pulse: 77  70   Resp: 13  16   Temp:      TempSrc:      SpO2: 98%  96%   Weight:      Height:      PainSc:  6   Asleep    Isolation Precautions No active isolations  Medications Medications  escitalopram (LEXAPRO) tablet 10 mg (10 mg Oral Given 08/20/22 0915)  ondansetron (ZOFRAN-ODT) disintegrating tablet 4 mg (has no administration in time range)  baclofen (LIORESAL) tablet 20 mg (has no administration in time range)  gabapentin (NEURONTIN) capsule 100 mg (100 mg Oral Patient Refused/Not Given 08/19/22 2121)  Oxcarbazepine (TRILEPTAL) tablet 600 mg (600 mg Oral Given 08/20/22 1020)  polyvinyl alcohol (LIQUIFILM TEARS) 1.4 % ophthalmic solution 1 drop (has no administration in time range)  enoxaparin (LOVENOX) injection 40 mg (40 mg Subcutaneous Given 08/20/22 0921)  sodium chloride flush (NS) 0.9 % injection 3 mL (3 mLs Intravenous Given 08/20/22 0918)  0.9 %  sodium chloride infusion (0 mLs Intravenous Stopped 08/20/22 1016)  acetaminophen (TYLENOL) tablet 650 mg (has no administration in time range)    Or  acetaminophen (TYLENOL) suppository 650 mg (has no administration in time range)  polyethylene glycol (MIRALAX / GLYCOLAX) packet 17 g (has no administration in time range)  albuterol (PROVENTIL) (2.5 MG/3ML) 0.083% nebulizer solution 2.5 mg (has no administration in time range)  HYDROmorphone (DILAUDID) injection 0.5-1 mg (1 mg Intravenous Given 08/20/22 0921)  potassium chloride 10 mEq in 100 mL IVPB (10 mEq Intravenous New Bag/Given 08/20/22 1130)  0.9 %  sodium chloride infusion ( Intravenous New Bag/Given 08/20/22 1017)  sodium chloride 0.9 % bolus 1,000 mL (0 mLs Intravenous Stopped 08/19/22 1130)  ceFEPIme (MAXIPIME) 2 g in sodium  chloride 0.9 % 100 mL IVPB (0 g Intravenous Stopped 08/19/22 1022)  HYDROmorphone (DILAUDID) injection 1 mg (1 mg Intravenous Given 08/19/22 1008)  lactated ringers bolus 1,000 mL (0 mLs Intravenous Stopped 08/19/22 1404)    Mobility manual wheelchair High fall risk   Focused Assessments Neuro Assessment Handoff:  Swallow screen pass? Yes          Neuro Assessment: Exceptions to WDL Neuro Checks:      Last Documented NIHSS Modified Score:   Has TPA been given? No If patient is a Neuro Trauma and patient is going to OR before floor call report to 4N Charge nurse: [phone] or [phone]   R Recommendations: See Admitting Provider Note  Report given to:   Additional Notes:

## 2022-08-20 NOTE — Progress Notes (Signed)
[facility] PROGRESS NOTE   [name] [id number] DOB: Jan 13, 1961 DOA: 08/19/2022  PCP: Reeves Dam, MD  Brief History/Interval Summary: 61 y.o. 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  Also with history of depression and recent trigeminal neuralgia presenting with altered mental status. He has been dealing with significant pain from trigeminal neuralgia and has had decrease p.o. intake due to this.  He was seen 4 days ago in the ED after having the first couple days of decreased appetite and becoming generally weaker than normal.  Concern was for dehydration and he was treated with IV fluids.  He improved initially following this visit and was seen at his neurologist office the next day and was noted to be generally stable other than some mildly reduced strength to 3/5 in his left upper extremity. Following this patient has continued to decline with worsening mentation, decreased speaking, increased weakness.  At baseline he is wheelchair-bound and quadriplegic with only mild use of his arms as above.  He uses a lift for transfers.  Patient noted to be dehydrated on assessment in the emergency department.  He was given IV fluids and hospitalized for further management. Of note, patient is scheduled to have nerve block to help with his trigeminal neuralgia on November 14.    Consultants: None  Procedures: None    Subjective/Interval History: Patient noted to be awake though somewhat lethargic.  He mentioned that he is feeling better compared to yesterday.  Able to move his left arm.  Having a lot of discomfort from his trigeminal neuralgia.    Assessment/Plan:  Acute metabolic encephalopathy Likely due to dehydration.  Seems to be improving  gradually.  Continue IV hydration for now.  He has multiple neurological deficits due to his MS.  Acute kidney injury/leukocytosis Likely due to dehydration.  Kidney function has improved this morning.  WBC is normal this morning.  Continue with IV fluids for another 24 hours. UA is unremarkable for any infection.  Hypokalemia This will be repleted.  Multiple sclerosis Followed by Dr. Felecia Shelling.  Currently on Trileptal gabapentin, baclofen as needed.  Has spastic quadriplegia and neurogenic bladder at baseline.  Is wheelchair-bound.  Trigeminal neuralgia Has significant symptoms.  Patient was referred to pain management by his neurologist.  It appears that the plan is for nerve block on November 14. Significant pain is impeding his ability to take orally.  We will give him narcotic agents as needed.  History of depression Continue Lexapro  DVT Prophylaxis: Lovenox Code Status: DNR Family Communication: Discussed with patient.  No family at bedside Disposition Plan: Hopefully return home in improved  Status is: Inpatient Remains inpatient appropriate because: Acute metabolic encephalopathy, pain due to trigeminal neuralgia      Medications: Scheduled:  enoxaparin (LOVENOX) injection  40 mg Subcutaneous Daily   escitalopram  10 mg Oral Daily   gabapentin  100 mg Oral QHS   Oxcarbazepine  600 mg Oral BID   sodium chloride flush  3 mL Intravenous Q12H   Continuous:  potassium chloride     [id number] **OR** acetaminophen, albuterol, baclofen, HYDROmorphone (DILAUDID) injection, ondansetron, polyethylene glycol, polyvinyl alcohol  Antibiotics: Anti-infectives (From admission, onward)    Start     Dose/Rate Route Frequency Ordered Stop   08/19/22 0930  ceFEPIme (MAXIPIME) 2 g in sodium chloride 0.9 % 100 mL IVPB  2 g 200 mL/hr over 30 Minutes Intravenous  Once 08/19/22 0927 08/19/22 1022       Objective:  Vital Signs  Vitals:   08/20/22 0500 08/20/22 0600  08/20/22 0700 08/20/22 0911  BP: 99/60 101/68 102/65   Pulse: 73 78 81   Resp: 12 15 (!) 29   Temp:    97.9 F (36.6 C)  TempSrc:    Oral  SpO2: 98% 97% 96%   Weight:      Height:        Intake/Output Summary (Last 24 hours) at 08/20/2022 0926 Last data filed at 08/20/2022 0650 Gross per 24 hour  Intake 2000.1 ml  Output 1500 ml  Net 500.1 ml   Filed Weights   08/19/22 0927  Weight: 94.8 kg    General appearance: He is awake but lethargic.  No distress. Resp: Clear to auscultation bilaterally.  Normal effort Cardio: S1-S2 is normal regular.  No S3-S4.  No rubs murmurs or bruit GI: Abdomen is soft.  Nontender nondistended.  Bowel sounds are present normal.  No masses organomegaly Extremities:  Able to move his left upper extremity.  Has mild edema bilateral lower extremities. Foley catheter is noted.  Lab Results:  Data Reviewed: I have personally reviewed following labs and reports of the imaging studies  CBC: Recent Labs  Lab 08/16/22 0827 08/19/22 0924 08/20/22 0445  WBC 8.2 11.8* 7.8  NEUTROABS 6.6 10.7*  --   HGB 15.3 17.6* 13.7  HCT 45.9 51.2 41.3  MCV 94.1 93.6 94.3  PLT 239 233 [id number]    Basic Metabolic Panel: Recent Labs  Lab 08/16/22 0827 08/19/22 0924 08/20/22 0445  NA 137 141 142  K 3.9 4.0 3.3*  CL 102 109 116*  CO2 17* 12* 16*  GLUCOSE 72 124* 98  BUN 10 9 7   CREATININE 0.86 1.37* 1.03  CALCIUM 9.5 9.5 8.7*    GFR: Estimated Creatinine Clearance: 89.2 mL/min (by C-G formula based on SCr of 1.03 mg/dL).  Liver Function Tests: Recent Labs  Lab 08/16/22 0827 08/19/22 0924 08/20/22 0445  AST 8* 14* 13*  ALT 6 10 7   ALKPHOS 115 117 84  BILITOT 0.6 1.4* 1.0  PROT 7.4 7.3 5.3*  ALBUMIN 4.9 4.5 3.3*     Cardiac Enzymes: Recent Labs  Lab 08/19/22 0924  CKTOTAL 20*      Thyroid Function Tests: Recent Labs    08/19/22 0924  TSH 0.746     Recent Results (from the past 240 hour(s))  Urine Culture     Status: Abnormal    Collection Time: 08/16/22  8:27 AM   Specimen: Urine, Catheterized  Result Value Ref Range Status   Specimen Description   Final    URINE, CATHETERIZED Performed at [facility], [address], [place], [place] [address]    Special Requests   Final    NONE Performed at [facility], [address] [place], [place] [address]    Culture MULTIPLE SPECIES PRESENT, SUGGEST RECOLLECTION (A)  Final   Report Status 08/17/2022 FINAL  Final      Radiology Studies: CT Head Wo Contrast  Result Date: 08/19/2022 CLINICAL DATA:  Altered mental status EXAM: CT HEAD WITHOUT CONTRAST TECHNIQUE: Contiguous axial images were obtained from the base of the skull through the vertex without intravenous contrast. RADIATION DOSE REDUCTION: This exam was performed according to the departmental dose-optimization program which includes automated exposure control, adjustment of the mA and/or kV according to patient size and/or  use of iterative reconstruction technique. COMPARISON:  None Available. FINDINGS: Brain: No evidence of acute infarction, hemorrhage, hydrocephalus, extra-axial collection or mass lesion/mass effect. Vascular: No hyperdense vessel or unexpected calcification. Skull: Normal. Negative for fracture or focal lesion. Sinuses/Orbits: No acute finding. Other: None. IMPRESSION: No acute intracranial pathology. Electronically Signed   By: Allegra Lai M.D.   On: 08/19/2022 10:51   DG Abdomen 1 View  Result Date: 08/19/2022 CLINICAL DATA:  Constipation. EXAM: ABDOMEN - 1 VIEW COMPARISON:  None Available. FINDINGS: Moderate stool mainly in the right and transverse colon. The rectum is not included. Mild gaseous distension of the sigmoid colon. Scattered air-filled loops of small bowel without significant distension. No obvious free air. IMPRESSION: Moderate stool in the right and transverse colon. No findings for small bowel obstruction or obvious free air.  Electronically Signed   By: Rudie Meyer M.D.   On: 08/19/2022 09:40   DG Chest 1 View  Result Date: 08/19/2022 CLINICAL DATA:  AMS EXAM: CHEST  1 VIEW COMPARISON:  Chest x-ray May 30, 23. FINDINGS: Low lung volumes. No consolidation. No visible pleural effusions or pneumothorax. Cardiomediastinal silhouette is within normal limits. IMPRESSION: Low lung volumes without evidence of acute cardiopulmonary disease. Electronically Signed   By: Feliberto Harts M.D.   On: 08/19/2022 09:38       LOS: 0 days   Meleni Delahunt Rito Ehrlich  [facility] Pager on [email or web address]  08/20/2022, 9:26 AM

## 2022-08-21 LAB — CBC
HCT: 38.8 % — ABNORMAL LOW (ref 39.0–52.0)
Hemoglobin: 13.4 g/dL (ref 13.0–17.0)
MCH: 31.9 pg (ref 26.0–34.0)
MCHC: 34.5 g/dL (ref 30.0–36.0)
MCV: 92.4 fL (ref 80.0–100.0)
Platelets: 181 10*3/uL (ref 150–400)
RBC: 4.2 MIL/uL — ABNORMAL LOW (ref 4.22–5.81)
RDW: 14.2 % (ref 11.5–15.5)
WBC: 7 10*3/uL (ref 4.0–10.5)
nRBC: 0 % (ref 0.0–0.2)

## 2022-08-21 LAB — BASIC METABOLIC PANEL
Anion gap: 11 (ref 5–15)
BUN: 7 mg/dL (ref 6–20)
CO2: 20 mmol/L — ABNORMAL LOW (ref 22–32)
Calcium: 8.8 mg/dL — ABNORMAL LOW (ref 8.9–10.3)
Chloride: 112 mmol/L — ABNORMAL HIGH (ref 98–111)
Creatinine, Ser: 0.96 mg/dL (ref 0.61–1.24)
GFR, Estimated: >60 mL/min (ref >60)
Glucose, Bld: 86 mg/dL (ref 70–99)
Potassium: 3.1 mmol/L — ABNORMAL LOW (ref 3.5–5.1)
Sodium: 143 mmol/L (ref 135–145)

## 2022-08-21 LAB — MAGNESIUM: Magnesium: 1.8 mg/dL (ref 1.7–2.4)

## 2022-08-21 MED ORDER — MODAFINIL 100 MG PO TABS
100.0000 mg | ORAL_TABLET | Freq: Every day | ORAL | Status: DC
  Administered 2022-08-21: 100 mg via ORAL
  Filled 2022-08-21: qty 1

## 2022-08-21 MED ORDER — POTASSIUM CHLORIDE 10 MEQ/100ML IV SOLN
10.0000 meq | INTRAVENOUS | Status: AC
  Administered 2022-08-21 (×4): 10 meq via INTRAVENOUS
  Filled 2022-08-21 (×3): qty 100

## 2022-08-21 MED ORDER — OXYCODONE-ACETAMINOPHEN 5-325 MG PO TABS: 1.0000 | ORAL_TABLET | Freq: Four times a day (QID) | ORAL | 0 refills | Status: DC | PRN

## 2022-08-21 MED ORDER — DEXTROMETHORPHAN-QUINIDINE 20-10 MG PO CAPS
1.0000 | ORAL_CAPSULE | Freq: Every day | ORAL | Status: DC
  Administered 2022-08-21: 1 via ORAL
  Filled 2022-08-21: qty 1

## 2022-08-21 MED ORDER — POTASSIUM CHLORIDE CRYS ER 10 MEQ PO TBCR: 10.0000 meq | EXTENDED_RELEASE_TABLET | Freq: Two times a day (BID) | ORAL | 0 refills | Status: DC

## 2022-08-21 MED ORDER — MAGNESIUM SULFATE 2 GM/50ML IV SOLN
2.0000 g | Freq: Once | INTRAVENOUS | Status: AC
  Administered 2022-08-21: 2 g via INTRAVENOUS
  Filled 2022-08-21: qty 50

## 2022-08-21 NOTE — Progress Notes (Signed)
Patient was DC, left unit at 1535 accompanied by wife in no distress.

## 2022-08-21 NOTE — Plan of Care (Signed)
  Problem: SLP Dysphagia Goals Goal: Patient will utilize recommended strategies Description: Patient will utilize recommended strategies during swallow to increase swallowing safety with Outcome: Adequate for Discharge   Problem: Education: Goal: Knowledge of General Education information will improve Description: Including pain rating scale, medication(s)/side effects and non-pharmacologic comfort measures Outcome: Adequate for Discharge   Problem: Health Behavior/Discharge Planning: Goal: Ability to manage health-related needs will improve Outcome: Adequate for Discharge   Problem: Clinical Measurements: Goal: Ability to maintain clinical measurements within normal limits will improve Outcome: Adequate for Discharge Goal: Will remain free from infection Outcome: Adequate for Discharge Goal: Diagnostic test results will improve Outcome: Adequate for Discharge Goal: Respiratory complications will improve Outcome: Adequate for Discharge Goal: Cardiovascular complication will be avoided Outcome: Adequate for Discharge   Problem: Activity: Goal: Risk for activity intolerance will decrease Outcome: Adequate for Discharge   Problem: Nutrition: Goal: Adequate nutrition will be maintained Outcome: Adequate for Discharge   Problem: Coping: Goal: Level of anxiety will decrease Outcome: Adequate for Discharge   Problem: Elimination: Goal: Will not experience complications related to bowel motility Outcome: Adequate for Discharge Goal: Will not experience complications related to urinary retention Outcome: Adequate for Discharge   Problem: Pain Managment: Goal: General experience of comfort will improve Outcome: Adequate for Discharge   Problem: Safety: Goal: Ability to remain free from injury will improve Outcome: Adequate for Discharge   Problem: Skin Integrity: Goal: Risk for impaired skin integrity will decrease Outcome: Adequate for Discharge

## 2022-08-21 NOTE — Progress Notes (Signed)
Discharge instructions (including medications) discussed with and copy provided to patient/caregiver 

## 2022-08-21 NOTE — Discharge Summary (Signed)
[facility]  Physician Discharge Summary   Patient ID: [name] MRN: [id number] DOB/AGE: 06-04-1961 61 y.o.  Admit date: 08/19/2022 Discharge date: 08/21/2022    PCP: Agustina Caroli, MD  DISCHARGE DIAGNOSES:    Acute encephalopathy   Multiple sclerosis, secondary progressive (HCC)   Neurogenic bladder   Spastic quadriparesis (HCC)   Restrictive lung disease   Depression   Multiple sclerosis (HCC)   Pressure injury of skin   RECOMMENDATIONS FOR OUTPATIENT FOLLOW UP: Patient to keep his appointment with the pain clinic for nerve block on November 14   Home Health: Home health services to be resumed Equipment/Devices: None  CODE STATUS: Full code  DISCHARGE CONDITION: fair  Diet recommendation: Clear liquids for now  INITIAL HISTORY: 61 y.o. 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  Also with history of depression and recent trigeminal neuralgia presenting with altered mental status. He has been dealing with significant pain from trigeminal neuralgia and has had decrease p.o. intake due to this.  He was seen 4 days ago in the ED after having the first couple days of decreased appetite and becoming generally weaker than normal.  Concern was for dehydration and he was treated with IV fluids.  He improved initially following this visit and was seen at his neurologist office the next day and was noted to be generally stable other than some mildly reduced strength to 3/5 in his left upper extremity. Following this patient has continued to decline with worsening mentation, decreased speaking, increased weakness.  At baseline he is wheelchair-bound and quadriplegic with only mild use of his arms as above.  He uses a lift for transfers.  Patient  noted to be dehydrated on assessment in the emergency department.  He was given IV fluids and hospitalized for further management. Of note, patient is scheduled to have nerve block to help with his trigeminal neuralgia on November 14.      HOSPITAL COURSE:   Acute metabolic encephalopathy Likely due to dehydration.  Patient was given IV fluids with improvement in his mentation.     Acute kidney injury/leukocytosis Likely due to dehydration.  Improved with IV fluids.   UA is unremarkable for any infection.   Hypokalemia Will be prescribed potassium supplements.  Magnesium 1.8..    Multiple sclerosis Followed by Dr. Epimenio Foot.  Currently on Trileptal gabapentin, baclofen as needed.  Has spastic quadriplegia and neurogenic bladder at baseline.  Is wheelchair-bound.   Trigeminal neuralgia Has significant symptoms.  Patient was referred to pain management by his neurologist.  It appears that the plan is for nerve block on November 14. Significant pain is impeding his ability to take orally.  We will give him narcotic agents as needed. With IV hydration his symptoms appear to have improved some.  He is tolerating his medications and clear liquid diet for now.   History of depression Continue Lexapro   Obesity Estimated body mass index is 30.11 kg/m as calculated from the following:   Height as of this encounter: 5\' 10"  (1.778 m).   Weight as of this encounter: 95.2 kg.  Patient has improved.  Discussed at length with patient and his wife.  Patient instructed to stay well-hydrated.  Hopefully once his procedure/nerve block is performed number 14th his oral intake will improve.  Okay for discharge today.     PERTINENT LABS:  The results of significant diagnostics from this hospitalization (including imaging, microbiology, ancillary and laboratory) are listed below for reference.    Microbiology: Recent Results (from the past 240 hour(s))  Urine Culture     Status: Abnormal   Collection  Time: 08/16/22  8:27 AM   Specimen: Urine, Catheterized  Result Value Ref Range Status   Specimen Description   Final    URINE, CATHETERIZED Performed at [facility], [address] [place], [place] [address]    Special Requests   Final    NONE Performed at [facility], [address] [place], [place] [address]    Culture MULTIPLE SPECIES PRESENT, SUGGEST RECOLLECTION (A)  Final   Report Status 08/17/2022 FINAL  Final  Blood culture (routine x 2)     Status: None (Preliminary result)   Collection Time: 08/19/22  8:53 AM   Specimen: BLOOD  Result Value Ref Range Status   Specimen Description BLOOD LEFT ANTECUBITAL  Final   Special Requests   Final    BOTTLES DRAWN AEROBIC AND ANAEROBIC Blood Culture results may not be optimal due to an inadequate volume of blood received in culture bottles   Culture   Final    NO GROWTH 3 DAYS Performed at [facility], [address], [place], [place] [address]    Report Status PENDING  Incomplete  Culture, Urine (Do not remove urinary catheter, catheter placed by urology or difficult to place)     Status: Abnormal   Collection Time: 08/19/22  2:09 PM   Specimen: Urine, Catheterized  Result Value Ref Range Status   Specimen Description URINE, CATHETERIZED  Final   Special Requests NONE  Final   Culture (A)  Final    <10,000 COLONIES/mL INSIGNIFICANT GROWTH Performed at [facility], [address], [place], [place] [address]    Report Status 08/22/2022 FINAL  Final     Labs:   Basic Metabolic Panel: Recent Labs  Lab 08/16/22 0827 08/19/22 0924 08/20/22 0445 08/21/22 0320  NA 137 141 142 143  K 3.9 4.0 3.3* 3.1*  CL 102 109 116* 112*  CO2 17* 12* 16* 20*  GLUCOSE 72 124* 98 86  BUN 10 9 7 7   CREATININE 0.86 1.37* 1.03 0.96  CALCIUM 9.5 9.5 8.7* 8.8*  MG  --   --   --  1.8   Liver Function Tests: Recent Labs  Lab 08/16/22 0827 08/19/22 0924 08/20/22 0445  AST 8* 14*  13*  ALT 6 10 7   ALKPHOS 115 117 84  BILITOT 0.6 1.4* 1.0  PROT 7.4 7.3 5.3*  ALBUMIN 4.9 4.5 3.3*    CBC: Recent Labs  Lab 08/16/22 0827 08/19/22 0924 08/20/22 0445 08/21/22 0320  WBC 8.2 11.8* 7.8 7.0  NEUTROABS 6.6 10.7*  --   --   HGB 15.3 17.6* 13.7 13.4  HCT 45.9 51.2 41.3 38.8*  MCV 94.1 93.6 94.3 92.4  PLT 239 233 222 181   Cardiac Enzymes: Recent Labs  Lab 08/19/22 0924  CKTOTAL 20*    IMAGING STUDIES CT Head Wo Contrast  Result Date: 08/19/2022 CLINICAL DATA:  Altered mental status EXAM: CT HEAD WITHOUT CONTRAST TECHNIQUE: Contiguous axial images were obtained from the base of the skull through the vertex without intravenous contrast. RADIATION DOSE REDUCTION: This exam was performed according to the departmental dose-optimization program which includes automated exposure control, adjustment of the mA and/or kV according to patient size and/or use of iterative reconstruction technique. COMPARISON:  None Available. FINDINGS: Brain: No evidence of acute infarction, hemorrhage, hydrocephalus, extra-axial collection or mass lesion/mass effect. Vascular: No hyperdense vessel or unexpected calcification. Skull: Normal. Negative for fracture or focal lesion. Sinuses/Orbits: No acute finding. Other: None. IMPRESSION: No acute intracranial pathology. Electronically Signed   By: Allegra Lai M.D.   On: 08/19/2022 10:51   DG Abdomen 1 View  Result Date: 08/19/2022 CLINICAL DATA:  Constipation. EXAM: ABDOMEN - 1 VIEW COMPARISON:  None Available. FINDINGS: Moderate stool mainly in the right and transverse colon. The rectum is not included. Mild gaseous distension of the sigmoid colon. Scattered air-filled loops of small bowel without significant distension. No obvious free air. IMPRESSION: Moderate stool in the right and transverse colon. No findings for small bowel obstruction or obvious free air. Electronically Signed   By: Rudie Meyer M.D.   On: 08/19/2022 09:40   DG Chest  1 View  Result Date: 08/19/2022 CLINICAL DATA:  AMS EXAM: CHEST  1 VIEW COMPARISON:  Chest x-ray May 30, 23. FINDINGS: Low lung volumes. No consolidation. No visible pleural effusions or pneumothorax. Cardiomediastinal silhouette is within normal limits. IMPRESSION: Low lung volumes without evidence of acute cardiopulmonary disease. Electronically Signed   By: Feliberto Harts M.D.   On: 08/19/2022 09:38    DISCHARGE EXAMINATION: Vitals:   08/21/22 0446 08/21/22 0500 08/21/22 0700 08/21/22 1227  BP: 120/69  114/64 121/68  Pulse: 87  70 80  Resp: 17  17 16   Temp: 98 F (36.7 C)  98.3 F (36.8 C) 98.8 F (37.1 C)  TempSrc: Oral  Oral Oral  SpO2: 100%  96% 97%  Weight:  95.2 kg    Height:       General appearance: Awake alert.  In no distress Resp: Clear to auscultation bilaterally.  Normal effort Cardio: S1-S2 is normal regular.  No S3-S4.  No rubs murmurs or bruit GI: Abdomen is soft.  Nontender nondistended.  Bowel sounds are present normal.  No masses organomegaly   DISPOSITION: Home  Discharge Instructions     Call MD for:  extreme fatigue   Complete by: As directed    Call MD for:  persistant dizziness or light-headedness   Complete by: As directed    Call MD for:  persistant nausea and vomiting   Complete by: As directed    Call MD for:  severe uncontrolled pain   Complete by: As directed    Call MD for:  temperature >100.4   Complete by: As directed    Diet full liquid   Complete by: As directed    Discharge instructions   Complete by: As directed    Please be sure to keep your appointment on 08/25/22 for nerve block. Stay well hydrated. Call Dr. 08/27/22 office for further problems with pain.  You were cared for by a hospitalist during your hospital stay. If you have any questions about your discharge medications or the care you received while you were in the hospital after you are discharged, you can call the unit and asked to speak with the hospitalist on call if  the hospitalist that took care of you is not available. Once you are discharged, your primary care physician will handle any further medical issues. Please note that NO REFILLS for any discharge medications will be authorized once you are discharged, as it is imperative that you return to your primary care physician (or establish a relationship with a primary care physician if you do not have one) for your aftercare  needs so that they can reassess your need for medications and monitor your lab values. If you do not have a primary care physician, you can call [phone] for a physician referral.   Increase activity slowly   Complete by: As directed    No wound care   Complete by: As directed           Allergies as of 08/21/2022       Reactions   Gadopiclenol Nausea And Vomiting        Medication List     TAKE these medications    acetaminophen 325 MG tablet Commonly known as: TYLENOL Take 2 tablets (650 mg total) by mouth every 6 (six) hours as needed for mild pain, fever or moderate pain (or Fever >/= 101).   albuterol 108 (90 Base) MCG/ACT inhaler Commonly known as: VENTOLIN HFA Inhale 2 puffs into the lungs every 6 (six) hours as needed. What changed:  how much to take reasons to take this   baclofen 10 MG tablet Commonly known as: LIORESAL 1/2 tablet in the morning and at noon, and 2 tablets at night What changed:  how much to take how to take this when to take this reasons to take this additional instructions   cholecalciferol 25 MCG (1000 UNIT) tablet Commonly known as: VITAMIN D3 Take 1,000 Units by mouth daily.   D-MANNOSE PO Take 1,300 mg by mouth See admin instructions. 1300 mg with cranberry extract for uti   Dextromethorphan-quiNIDine 20-10 MG capsule Commonly known as: NUEDEXTA Take 1 tablet by mouth 2 (two) times daily.   escitalopram 10 MG tablet Commonly known as: LEXAPRO Take 10 mg by mouth daily.   gabapentin 100 MG capsule Commonly known  as: Neurontin Take 1 capsule (100 mg total) by mouth at bedtime. What changed:  when to take this reasons to take this   ibuprofen 200 MG tablet Commonly known as: ADVIL Take 600 mg by mouth every 6 (six) hours as needed for headache or moderate pain.   methenamine 1 g tablet Commonly known as: HIPREX Take 1 g by mouth 2 (two) times daily with a meal.   methocarbamol 500 MG tablet Commonly known as: ROBAXIN TAKE 1 TABLET BY MOUTH THREE TIMES A DAY   modafinil 100 MG tablet Commonly known as: PROVIGIL TAKE 1 TABLET (100 MG TOTAL) BY MOUTH IN THE MORNING. *SENT FOR PA* What changed: See the new instructions.   ocrelizumab 600 mg in sodium chloride 0.9 % 500 mL Inject 600 mg into the vein every 6 (six) months.   ondansetron 4 MG disintegrating tablet Commonly known as: ZOFRAN-ODT 4mg  ODT q4 hours prn nausea/vomit   Oxcarbazepine 300 MG tablet Commonly known as: TRILEPTAL Take 600 mg by mouth 2 (two) times daily.   oxyCODONE-acetaminophen 5-325 MG tablet Commonly known as: Percocet Take 1 tablet by mouth every 6 (six) hours as needed for severe pain.   potassium chloride 10 MEQ tablet Commonly known as: KLOR-CON M Take 1 tablet (10 mEq total) by mouth 2 (two) times daily for 5 days.   Theratears 1 % Gel Generic drug: Carboxymethylcellulose Sod PF Place 1 drop into both eyes daily as needed (dry eyes). What changed: Another medication with the same name was removed. Continue taking this medication, and follow the directions you see here.   vitamin C 100 MG tablet Take 100 mg by mouth daily.          Follow-up Information     Sater, , MD  Follow up.   Specialty: Neurology Why: If symptoms worsen Contact information: [address] [place] [address] [phone]                 TOTAL DISCHARGE TIME: 35 minutes  Rayni Nemitz Rito Ehrlich  [facility] Pager on [email or web address]  08/22/2022, 10:33 AM

## 2022-08-21 NOTE — TOC Transition Note (Addendum)
Transition of Care [facility]) - CM/SW Discharge Note   Patient Details  Name: [name] MRN: [id number] Date of Birth: July 29, 1961  [facility] CM/SW Contact:  Curlene Labrum, RN Phone Number: 08/21/2022, 1:23 PM   Clinical Narrative:    Cm met with the patient and wife at the bedside prior to being discharged home with bedside nursing.  The patient is active with [facility] for RN, PT, OT and plans to continue services.  Attending physician to co-sign home health orders.  The patient lives with his wife at the home, who provides 24 hour care at the home.  DME at the home - patient has all needed dme at the home including electric wheelchair and hoyer lift.  PCP - Patient plans to follow up with Dr. Felecia Shelling, [facility] Neurology, PCP follow up with Tawanna Cooler,  Patient also has a follow up appointment on 08/25/2022 with Dr. Anastasio Auerbach at the pain [facility].  I was unable to contact the [place] since they are closed today for Veteran's Day.  Patient to be discharged home with the wife after bedside nursing discusses discharge instructions.   Final next level of care: [facility] Barriers to Discharge: No Barriers Identified   Patient Goals and CMS Choice Patient states their goals for this hospitalization and ongoing recovery are:: To return home CMS Medicare.gov Compare Post Acute Care list provided to:: Patient Choice offered to / list presented to : Patient  Discharge Placement                       Discharge Plan and Services   Discharge Planning Services: CM Consult Post Acute Care Choice: Home Health            DME Agency:  ([place] with PT,OT RN)       HH Arranged: RN, PT, OT [facility] Agency: [place] (Active with [place], PT, OT) Date HH Agency Contacted: 08/21/22 Time [place]: 1321 Representative spoke with at [place]: Marisue [place], [place] with [facility]  Social Determinants of Health  ([place]) Interventions     Readmission Risk Interventions    08/21/2022    1:23 PM  Readmission Risk Prevention Plan  Post Dischage Appt Complete  Medication Screening Complete  Transportation Screening Complete

## 2022-08-22 LAB — URINE CULTURE: Culture: <10000 — AB

## 2022-08-24 LAB — CULTURE, BLOOD (ROUTINE X 2): Culture: NO GROWTH

## 2022-08-25 DIAGNOSIS — G35 Multiple sclerosis: Secondary | ICD-10-CM | POA: Diagnosis not present

## 2022-08-25 DIAGNOSIS — G501 Atypical facial pain: Secondary | ICD-10-CM | POA: Diagnosis not present

## 2022-08-25 DIAGNOSIS — Z79899 Other long term (current) drug therapy: Secondary | ICD-10-CM | POA: Diagnosis not present

## 2022-08-30 ENCOUNTER — Emergency Department (HOSPITAL_BASED_OUTPATIENT_CLINIC_OR_DEPARTMENT_OTHER)
Admission: EM | Admit: 2022-08-30 | Discharge: 2022-08-30 | Disposition: A | Discharge status: Home / Self Care | Payer: No Typology Code available for payment source | Attending: Emergency Medicine | Admitting: Emergency Medicine

## 2022-08-30 ENCOUNTER — Encounter (HOSPITAL_BASED_OUTPATIENT_CLINIC_OR_DEPARTMENT_OTHER): Payer: Self-pay | Admitting: Emergency Medicine

## 2022-08-30 ENCOUNTER — Other Ambulatory Visit: Payer: Self-pay

## 2022-08-30 DIAGNOSIS — R2241 Localized swelling, mass and lump, right lower limb: Secondary | ICD-10-CM | POA: Insufficient documentation

## 2022-08-30 DIAGNOSIS — R627 Adult failure to thrive: Secondary | ICD-10-CM | POA: Insufficient documentation

## 2022-08-30 DIAGNOSIS — E876 Hypokalemia: Secondary | ICD-10-CM | POA: Diagnosis not present

## 2022-08-30 DIAGNOSIS — G500 Trigeminal neuralgia: Secondary | ICD-10-CM

## 2022-08-30 LAB — CBC WITH DIFFERENTIAL/PLATELET
Abs Immature Granulocytes: 0.05 10*3/uL (ref 0.00–0.07)
Basophils Absolute: 0.1 10*3/uL (ref 0.0–0.1)
Basophils Relative: 1 %
Eosinophils Absolute: 0.5 10*3/uL (ref 0.0–0.5)
Eosinophils Relative: 6 %
HCT: 44.3 % (ref 39.0–52.0)
Hemoglobin: 15.2 g/dL (ref 13.0–17.0)
Immature Granulocytes: 1 %
Lymphocytes Relative: 11 %
Lymphs Abs: 1 10*3/uL (ref 0.7–4.0)
MCH: 31.6 pg (ref 26.0–34.0)
MCHC: 34.3 g/dL (ref 30.0–36.0)
MCV: 92.1 fL (ref 80.0–100.0)
Monocytes Absolute: 1 10*3/uL (ref 0.1–1.0)
Monocytes Relative: 11 %
Neutro Abs: 6.5 10*3/uL (ref 1.7–7.7)
Neutrophils Relative %: 70 %
Platelets: 274 10*3/uL (ref 150–400)
RBC: 4.81 MIL/uL (ref 4.22–5.81)
RDW: 13.6 % (ref 11.5–15.5)
WBC: 9 10*3/uL (ref 4.0–10.5)
nRBC: 0 % (ref 0.0–0.2)

## 2022-08-30 LAB — COMPREHENSIVE METABOLIC PANEL
ALT: 59 U/L — ABNORMAL HIGH (ref 0–44)
AST: 38 U/L (ref 15–41)
Albumin: 4.6 g/dL (ref 3.5–5.0)
Alkaline Phosphatase: 117 U/L (ref 38–126)
Anion gap: 20 — ABNORMAL HIGH (ref 5–15)
BUN: 15 mg/dL (ref 6–20)
CO2: 24 mmol/L (ref 22–32)
Calcium: 9.8 mg/dL (ref 8.9–10.3)
Chloride: 100 mmol/L (ref 98–111)
Creatinine, Ser: 0.86 mg/dL (ref 0.61–1.24)
GFR, Estimated: >60 mL/min (ref >60)
Glucose, Bld: 83 mg/dL (ref 70–99)
Potassium: 2.9 mmol/L — ABNORMAL LOW (ref 3.5–5.1)
Sodium: 144 mmol/L (ref 135–145)
Total Bilirubin: 0.9 mg/dL (ref 0.3–1.2)
Total Protein: 6.9 g/dL (ref 6.5–8.1)

## 2022-08-30 LAB — PHOSPHORUS: Phosphorus: 2.7 mg/dL (ref 2.5–4.6)

## 2022-08-30 LAB — MAGNESIUM: Magnesium: 2.2 mg/dL (ref 1.7–2.4)

## 2022-08-30 MED ORDER — POTASSIUM CHLORIDE 10 MEQ/100ML IV SOLN
10.0000 meq | INTRAVENOUS | Status: AC
  Administered 2022-08-30 (×4): 10 meq via INTRAVENOUS
  Filled 2022-08-30: qty 100

## 2022-08-30 MED ORDER — MORPHINE SULFATE (PF) 4 MG/ML IV SOLN
4.0000 mg | Freq: Once | INTRAVENOUS | Status: AC
  Administered 2022-08-30: 4 mg via INTRAVENOUS
  Filled 2022-08-30: qty 1

## 2022-08-30 MED ORDER — KETOROLAC TROMETHAMINE 15 MG/ML IJ SOLN
15.0000 mg | Freq: Once | INTRAMUSCULAR | Status: AC
  Administered 2022-08-30: 15 mg via INTRAVENOUS
  Filled 2022-08-30: qty 1

## 2022-08-30 MED ORDER — LACTATED RINGERS IV BOLUS
1000.0000 mL | Freq: Once | INTRAVENOUS | Status: AC
  Administered 2022-08-30: 1000 mL via INTRAVENOUS

## 2022-08-30 MED ORDER — POTASSIUM CHLORIDE 20 MEQ PO PACK
40.0000 meq | PACK | Freq: Once | ORAL | Status: AC
  Administered 2022-08-30: 40 meq via ORAL
  Filled 2022-08-30: qty 2

## 2022-08-30 MED ORDER — POTASSIUM CHLORIDE 10 MEQ PO PACK: 10.0000 meq | PACK | Freq: Every day | ORAL | 0 refills | Status: DC

## 2022-08-30 NOTE — ED Triage Notes (Signed)
Pt had nerve block this week for trigeminal neuralgia, did not help. Pt still not able to eat or drink.

## 2022-08-30 NOTE — Discharge Instructions (Signed)
You were seen in the emergency department for your dehydration due to your trigeminal neuralgia pain.  Your potassium level was low but otherwise had no signs of severe dehydration.  We gave you potassium repletion by mouth and through the IV.  I have given you prescription for potassium packets you should continue to take daily for at least the next week.  You should follow-up with your primary doctor as planned and have your potassium level rechecked and to come up with a long-term plan for your trigeminal neuralgia pain and staying hydrated.  You should return to the emergency department if you are having worsening dehydration, you pass out, you have confusion, your hard to wake up or if you have any other new or concerning symptoms.

## 2022-08-30 NOTE — ED Provider Notes (Signed)
[facility] EMERGENCY DEPT Provider Note   CSN: [id number] Arrival date & time: 08/30/22  0732     History  Chief Complaint  Patient presents with   Failure To Thrive    [name] is a 61 y.o. male.  Patient is a 61 year old male with a past medical history of MS with spastic quadriplegia status and wheelchair-bound at baseline, neurogenic bladder with chronic Foley catheter presenting to the emergency department with failure to thrive.  Patient was recently diagnosed with trigeminal neuralgia and has been dealing with significant pain in his right side of his face and jaw for the last several months.  He did have a recent hospitalization for dehydration secondary to his pain.  He is scheduled for a nerve block on 11/14 and his wife reports that initially his pain was significantly improved, however over the last several days the numbing has worn off and his pain has returned and he has been unable to eat or drink.  She states that he only take small sips with his medications but is unable to tolerate any additional food or drinks secondary to his pain.  She denies any fevers, nausea or vomiting or diarrhea.  He denies any abdominal pain.  The history is provided by the patient and the spouse. The history is limited by the condition of the patient.       Home Medications Prior to Admission medications   Medication Sig Start Date End Date Taking? Authorizing Provider  Potassium Chloride 10 MEQ PACK Take 10 mEq by mouth daily. 08/30/22  Yes Theresia Lo, Benetta Spar K, DO  acetaminophen (TYLENOL) 325 MG tablet Take 2 tablets (650 mg total) by mouth every 6 (six) hours as needed for mild pain, fever or moderate pain (or Fever >/= 101). 03/12/22   Azucena Fallen, MD  albuterol (VENTOLIN HFA) 108 (90 Base) MCG/ACT inhaler Inhale 2 puffs into the lungs every 6 (six) hours as needed. Patient taking differently: Inhale 1-2 puffs into the lungs every 6 (six) hours as needed for  wheezing. 07/24/21   York Spaniel, MD  Ascorbic Acid (VITAMIN C) 100 MG tablet Take 100 mg by mouth daily.    [provider]  baclofen (LIORESAL) 10 MG tablet 1/2 tablet in the morning and at noon, and 2 tablets at night Patient taking differently: Take 20 mg by mouth daily as needed for muscle spasms. 05/20/16   York Spaniel, MD  Carboxymethylcellulose Sod PF (THERATEARS) 1 % GEL Place 1 drop into both eyes daily as needed (dry eyes).    [provider]  cholecalciferol (VITAMIN D3) 25 MCG (1000 UNIT) tablet Take 1,000 Units by mouth daily.    [provider]  D-MANNOSE PO Take 1,300 mg by mouth See admin instructions. 1300 mg with cranberry extract for uti    [provider]  Dextromethorphan-Quinidine 20-10 MG CAPS Take 1 tablet by mouth 2 (two) times daily. Patient not taking: Reported on 08/19/2022 05/27/17   York Spaniel, MD  escitalopram (LEXAPRO) 10 MG tablet Take 10 mg by mouth daily.    [provider]  gabapentin (NEURONTIN) 100 MG capsule Take 1 capsule (100 mg total) by mouth at bedtime. Patient taking differently: Take 100 mg by mouth daily as needed (pain). 01/20/21   York Spaniel, MD  ibuprofen (ADVIL) 200 MG tablet Take 600 mg by mouth every 6 (six) hours as needed for headache or moderate pain.    [provider]  methenamine (HIPREX) 1  g tablet Take 1 g by mouth 2 (two) times daily with a meal.    [provider]  methocarbamol (ROBAXIN) 500 MG tablet TAKE 1 TABLET BY MOUTH THREE TIMES A DAY 04/21/22   Sater, Pearletha Furl, MD  modafinil (PROVIGIL) 100 MG tablet TAKE 1 TABLET (100 MG TOTAL) BY MOUTH IN THE MORNING. *SENT FOR PA* Patient taking differently: Take 100 mg by mouth daily. 11/29/20   York Spaniel, MD  ocrelizumab 600 mg in sodium chloride 0.9 % 500 mL Inject 600 mg into the vein every 6 (six) months.     [provider]  ondansetron (ZOFRAN-ODT) 4 MG disintegrating tablet 4mg  ODT q4  hours prn nausea/vomit Patient not taking: Reported on 08/19/2022 08/16/22   13/5/23, MD  Oxcarbazepine (TRILEPTAL) 300 MG tablet Take 600 mg by mouth 2 (two) times daily. 05/28/22   [provider]  oxyCODONE-acetaminophen (PERCOCET) 5-325 MG tablet Take 1 tablet by mouth every 6 (six) hours as needed for severe pain. 08/21/22 08/21/23  13/9/24, MD  potassium chloride (KLOR-CON M) 10 MEQ tablet Take 1 tablet (10 mEq total) by mouth 2 (two) times daily for 5 days. 08/21/22 08/26/22  08/28/22, MD      Allergies    Gadopiclenol    Review of Systems   Review of Systems  Physical Exam Updated Vital Signs BP 102/60   Pulse 63   Temp 98.6 F (37 C) (Oral)   Resp 16   SpO2 98%  Physical Exam Vitals and nursing note reviewed.  Constitutional:      Appearance: He is ill-appearing (Chronically).  HENT:     Head: Normocephalic and atraumatic.     Nose: Nose normal.     Mouth/Throat:     Mouth: Mucous membranes are dry.     Comments: Thick oral secretions Eyes:     Extraocular Movements: Extraocular movements intact.     Conjunctiva/sclera: Conjunctivae normal.  Cardiovascular:     Rate and Rhythm: Normal rate and regular rhythm.     Pulses: Normal pulses.     Heart sounds: Normal heart sounds.  Pulmonary:     Effort: Pulmonary effort is normal.     Breath sounds: Normal breath sounds.  Abdominal:     General: Abdomen is flat.     Palpations: Abdomen is soft.     Tenderness: There is no abdominal tenderness.  Musculoskeletal:     Cervical back: Normal range of motion and neck supple.     Right lower leg: Edema (Chronic 2+ edema to mid shin) present.     Left lower leg: No edema.  Skin:    General: Skin is warm and dry.  Neurological:     Mental Status: He is alert and oriented to person, place, and time. Mental status is at baseline.  Psychiatric:        Mood and Affect: Mood normal.        Behavior: Behavior normal.     ED Results /  Procedures / Treatments   Labs (all labs ordered are listed, but only abnormal results are displayed) Labs Reviewed  COMPREHENSIVE METABOLIC PANEL - Abnormal; Notable for the following components:      Result Value   Potassium 2.9 (*)    ALT 59 (*)    Anion gap 20 (*)    All other components within normal limits  MAGNESIUM  PHOSPHORUS  CBC WITH DIFFERENTIAL/PLATELET    EKG None  Radiology No results found.  Procedures  Procedures    Medications Ordered in ED Medications  lactated ringers bolus 1,000 mL (0 mLs Intravenous Stopped 08/30/22 0930)  ketorolac (TORADOL) 15 MG/ML injection 15 mg (15 mg Intravenous Given 08/30/22 0914)  potassium chloride 10 mEq in 100 mL IVPB (0 mEq Intravenous Stopped 08/30/22 1512)  potassium chloride (KLOR-CON) packet 40 mEq (40 mEq Oral Given 08/30/22 1131)  morphine (PF) 4 MG/ML injection 4 mg (4 mg Intravenous Given 08/30/22 1151)    ED Course/ Medical Decision Making/ A&P Clinical Course as of 08/30/22 1512  Sun Aug 30, 2022  1510 Patient was able to tolerate oral p.o.  Using shared decision making between the patient and his wife, they would prefer discharge home with outpatient management and plan to discuss with his primary doctor regarding hydration management long-term.  Was given potassium repletion and was recommended follow-up outpatient labs. [VK]    Clinical Course User Index [VK] Rexford Maus, DO                           Medical Decision Making This patient presents to the ED with chief complaint(s) of failure to thrive with pertinent past medical history of MS with quadriplegia wheelchair-bound at baseline and chronic indwelling Foley catheter, trigeminal neuralgia which further complicates the presenting complaint. The complaint involves an extensive differential diagnosis and also carries with it a high risk of complications and morbidity.    The differential diagnosis includes dehydration, electrolyte abnormality,  AKI, infection less likely as patient has had no fevers or infectious symptoms  Additional history obtained: Additional history obtained from spouse Records reviewed previous admission documents  ED Course and Reassessment: Upon patient's arrival to the emergency department he is awake and alert and chronically ill-appearing though hemodynamically stable.  He does have dry lips with thick oral secretions, concerning for dehydration.  He will be started on IV fluids and was given Toradol for pain.  He will have labs performed to evaluate for electrolyte derangements in the setting of dehydration.  Independent labs interpretation:  The following labs were independently interpreted: hypokalemia, elevated AGAP likely due to starvation ketosis  Independent visualization of imaging: N/A  Consultation: - Consulted or discussed management/test interpretation w/ external professional: N/A  Consideration for admission or further workup: Considered admission for rehydration and nutrition, however using shared decision making with the patient and his wife he would prefer to be discharged home with primary care follow-up Social Determinants of health: N/A    Amount and/or Complexity of Data Reviewed Labs: ordered.  Risk Prescription drug management.          Final Clinical Impression(s) / ED Diagnoses Final diagnoses:  Hypokalemia  Trigeminal neuralgia    Rx / DC Orders ED Discharge Orders          Ordered    Potassium Chloride 10 MEQ PACK  Daily        08/30/22 1509              Rexford Maus, DO 08/30/22 1512

## 2022-09-09 ENCOUNTER — Other Ambulatory Visit: Payer: Self-pay | Admitting: Neurology

## 2022-09-09 MED ORDER — OXYCODONE-ACETAMINOPHEN 5-325 MG PO TABS: 1.0000 | ORAL_TABLET | Freq: Four times a day (QID) | ORAL | 0 refills | Status: DC | PRN

## 2022-09-09 NOTE — Telephone Encounter (Signed)
Called [place] back. Pt was sent to [facility]. He was supposed to have surgery today. Scheduled for ambulatory clinic. But cx and r/s for 09/22/22 to be in patient instead.  ER, gave oxycodone 5-325  q 6hr prn for pain. Wondering if Dr. Epimenio Foot would be willing to refill until new surgery date. I placed on hold and spoke with Dr. Epimenio Foot.  He is willing to refill same directions, #60 one time to get him through until surgery date. I relayed this to [place]. Aware Dr. Epimenio Foot will e-scribe to [facility] for them.

## 2022-09-09 NOTE — Telephone Encounter (Signed)
[name] is calling. Stated she wants to talk to nurse about getting pt some pain medication.

## 2022-09-22 DIAGNOSIS — G500 Trigeminal neuralgia: Secondary | ICD-10-CM | POA: Diagnosis not present

## 2022-09-22 DIAGNOSIS — G501 Atypical facial pain: Secondary | ICD-10-CM | POA: Diagnosis not present

## 2022-09-22 DIAGNOSIS — Z993 Dependence on wheelchair: Secondary | ICD-10-CM | POA: Diagnosis not present

## 2022-09-22 DIAGNOSIS — G90521 Complex regional pain syndrome I of right lower limb: Secondary | ICD-10-CM | POA: Diagnosis not present

## 2022-09-22 DIAGNOSIS — G35 Multiple sclerosis: Secondary | ICD-10-CM | POA: Diagnosis not present

## 2022-09-27 ENCOUNTER — Encounter (HOSPITAL_BASED_OUTPATIENT_CLINIC_OR_DEPARTMENT_OTHER): Payer: Self-pay | Admitting: Emergency Medicine

## 2022-09-27 ENCOUNTER — Emergency Department (HOSPITAL_BASED_OUTPATIENT_CLINIC_OR_DEPARTMENT_OTHER): Payer: No Typology Code available for payment source

## 2022-09-27 ENCOUNTER — Emergency Department (HOSPITAL_BASED_OUTPATIENT_CLINIC_OR_DEPARTMENT_OTHER)
Admission: EM | Admit: 2022-09-27 | Discharge: 2022-09-27 | Discharge status: Against Medical Advice | Payer: No Typology Code available for payment source | Attending: Emergency Medicine | Admitting: Emergency Medicine

## 2022-09-27 ENCOUNTER — Other Ambulatory Visit: Payer: Self-pay

## 2022-09-27 DIAGNOSIS — Z1152 Encounter for screening for COVID-19: Secondary | ICD-10-CM | POA: Diagnosis not present

## 2022-09-27 DIAGNOSIS — N3000 Acute cystitis without hematuria: Secondary | ICD-10-CM | POA: Diagnosis not present

## 2022-09-27 DIAGNOSIS — Z5329 Procedure and treatment not carried out because of patient's decision for other reasons: Secondary | ICD-10-CM | POA: Diagnosis not present

## 2022-09-27 DIAGNOSIS — E876 Hypokalemia: Secondary | ICD-10-CM | POA: Diagnosis not present

## 2022-09-27 DIAGNOSIS — R110 Nausea: Secondary | ICD-10-CM | POA: Diagnosis present

## 2022-09-27 LAB — COMPREHENSIVE METABOLIC PANEL
ALT: <5 U/L (ref 0–44)
AST: 11 U/L — ABNORMAL LOW (ref 15–41)
Albumin: 3.9 g/dL (ref 3.5–5.0)
Alkaline Phosphatase: 70 U/L (ref 38–126)
Anion gap: 14 (ref 5–15)
BUN: 15 mg/dL (ref 6–20)
CO2: 33 mmol/L — ABNORMAL HIGH (ref 22–32)
Calcium: 9.2 mg/dL (ref 8.9–10.3)
Chloride: 94 mmol/L — ABNORMAL LOW (ref 98–111)
Creatinine, Ser: 0.76 mg/dL (ref 0.61–1.24)
GFR, Estimated: >60 mL/min (ref >60)
Glucose, Bld: 111 mg/dL — ABNORMAL HIGH (ref 70–99)
Potassium: 2.3 mmol/L — CL (ref 3.5–5.1)
Sodium: 141 mmol/L (ref 135–145)
Total Bilirubin: 1 mg/dL (ref 0.3–1.2)
Total Protein: 6.5 g/dL (ref 6.5–8.1)

## 2022-09-27 LAB — URINALYSIS, ROUTINE W REFLEX MICROSCOPIC
Bilirubin Urine: NEGATIVE
Glucose, UA: NEGATIVE mg/dL
Ketones, ur: >80 mg/dL — AB
Nitrite: NEGATIVE
Protein, ur: 30 mg/dL — AB
RBC / HPF: >50 RBC/hpf — ABNORMAL HIGH (ref 0–5)
Specific Gravity, Urine: 1.019 (ref 1.005–1.030)
WBC, UA: >50 WBC/hpf — ABNORMAL HIGH (ref 0–5)
pH: 6.5 (ref 5.0–8.0)

## 2022-09-27 LAB — LACTIC ACID, PLASMA: Lactic Acid, Venous: 1.1 mmol/L (ref 0.5–1.9)

## 2022-09-27 LAB — BASIC METABOLIC PANEL
Anion gap: 13 (ref 5–15)
BUN: 15 mg/dL (ref 6–20)
CO2: 32 mmol/L (ref 22–32)
Calcium: 8.8 mg/dL — ABNORMAL LOW (ref 8.9–10.3)
Chloride: 96 mmol/L — ABNORMAL LOW (ref 98–111)
Creatinine, Ser: 0.64 mg/dL (ref 0.61–1.24)
GFR, Estimated: >60 mL/min (ref >60)
Glucose, Bld: 103 mg/dL — ABNORMAL HIGH (ref 70–99)
Potassium: 2.9 mmol/L — ABNORMAL LOW (ref 3.5–5.1)
Sodium: 141 mmol/L (ref 135–145)

## 2022-09-27 LAB — RESP PANEL BY RT-PCR (RSV, FLU A&B, COVID)  RVPGX2
Influenza A by PCR: NEGATIVE
Influenza B by PCR: NEGATIVE
Resp Syncytial Virus by PCR: NEGATIVE
SARS Coronavirus 2 by RT PCR: NEGATIVE

## 2022-09-27 LAB — CBC
HCT: 40.3 % (ref 39.0–52.0)
Hemoglobin: 13.7 g/dL (ref 13.0–17.0)
MCH: 31.1 pg (ref 26.0–34.0)
MCHC: 34 g/dL (ref 30.0–36.0)
MCV: 91.6 fL (ref 80.0–100.0)
Platelets: 252 10*3/uL (ref 150–400)
RBC: 4.4 MIL/uL (ref 4.22–5.81)
RDW: 13.5 % (ref 11.5–15.5)
WBC: 7.9 10*3/uL (ref 4.0–10.5)
nRBC: 0 % (ref 0.0–0.2)

## 2022-09-27 LAB — LIPASE, BLOOD: Lipase: 26 U/L (ref 11–51)

## 2022-09-27 MED ORDER — POTASSIUM CHLORIDE CRYS ER 20 MEQ PO TBCR
40.0000 meq | EXTENDED_RELEASE_TABLET | Freq: Once | ORAL | Status: AC
  Administered 2022-09-27: 40 meq via ORAL
  Filled 2022-09-27: qty 2

## 2022-09-27 MED ORDER — SODIUM CHLORIDE 0.9 % IV BOLUS
1000.0000 mL | Freq: Once | INTRAVENOUS | Status: AC
  Administered 2022-09-27: 1000 mL via INTRAVENOUS

## 2022-09-27 MED ORDER — POTASSIUM CHLORIDE 10 MEQ/100ML IV SOLN
10.0000 meq | INTRAVENOUS | Status: AC
  Administered 2022-09-27 (×4): 10 meq via INTRAVENOUS
  Filled 2022-09-27 (×4): qty 100

## 2022-09-27 MED ORDER — CIPROFLOXACIN HCL 500 MG PO TABS
500.0000 mg | ORAL_TABLET | Freq: Once | ORAL | Status: AC
  Administered 2022-09-27: 500 mg via ORAL
  Filled 2022-09-27: qty 1

## 2022-09-27 MED ORDER — CIPROFLOXACIN HCL 500 MG PO TABS: 500.0000 mg | ORAL_TABLET | Freq: Two times a day (BID) | ORAL | 0 refills | Status: DC

## 2022-09-27 NOTE — ED Notes (Signed)
Dr. Fredderick Phenix aware of potassium level of 2.3.

## 2022-09-27 NOTE — ED Notes (Signed)
Pt could not tolerate PO potassium.

## 2022-09-27 NOTE — ED Triage Notes (Signed)
Pt /co N/V/D and decreased appetite since Tuesday.

## 2022-09-27 NOTE — ED Provider Notes (Addendum)
[place] EMERGENCY DEPT Provider Note   CSN: [id number] Arrival date & time: 09/27/22  [id number]     History  Chief Complaint  Patient presents with   Nausea    [name] is a 61 y.o. male.  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  He had a recent procedure to ablate the nerve and that has actually improved his pain but he still has not been eating well.  He has had some episodes of dry heaves over the last few days with the last one being 2 days ago.  He said his stomach just does not feel right.  He has had some mucousy clear stools which the wife says is not really uncommon for him.  He has been weaker than normal and not as active or alert as normal.  However he does not have any confusion.  He does have a history of recurrent UTIs.  They have not noted any fevers.  No new skin wounds.       Home Medications Prior to Admission medications   Medication Sig Start Date End Date Taking? Authorizing Provider  ciprofloxacin (CIPRO) 500 MG tablet Take 1 tablet (500 mg total) by mouth every 12 (twelve) hours. 09/27/22  Yes Malvin Johns, MD  acetaminophen (TYLENOL) 325 MG tablet Take 2 tablets (650 mg total) by mouth every 6 (six) hours as needed for mild pain, fever or moderate pain (or Fever >/= 101). 03/12/22   Little Ishikawa, MD  albuterol (VENTOLIN HFA) 108 (90 Base) MCG/ACT inhaler Inhale 2 puffs into the lungs every 6 (six) hours as needed. Patient taking differently: Inhale 1-2 puffs into the lungs every 6 (six) hours as needed for wheezing. 07/24/21   Kathrynn Ducking, MD  Ascorbic Acid (VITAMIN C) 100 MG tablet Take 100 mg by mouth daily.    [provider]  baclofen (LIORESAL) 10 MG tablet 1/2 tablet in the morning and at noon, and 2 tablets at  night Patient taking differently: Take 20 mg by mouth daily as needed for muscle spasms. 05/20/16   Kathrynn Ducking, MD  Carboxymethylcellulose Sod PF (THERATEARS) 1 % GEL Place 1 drop into both eyes daily as needed (dry eyes).    [provider]  cholecalciferol (VITAMIN D3) 25 MCG (1000 UNIT) tablet Take 1,000 Units by mouth daily.    [provider]  D-MANNOSE PO Take 1,300 mg by mouth See admin instructions. 1300 mg with cranberry extract for uti    [provider]  Dextromethorphan-Quinidine 20-10 MG CAPS Take 1 tablet by mouth 2 (two) times daily. Patient not taking: Reported on 08/19/2022 05/27/17   Kathrynn Ducking, MD  escitalopram (LEXAPRO) 10 MG tablet Take 10 mg by mouth daily.    [provider]  gabapentin (NEURONTIN) 100 MG capsule Take 1 capsule (100 mg total) by mouth at bedtime. Patient taking differently: Take 100 mg by mouth daily as needed (pain). 01/20/21   Kathrynn Ducking, MD  ibuprofen (ADVIL) 200 MG tablet Take 600 mg by mouth every 6 (six) hours as needed for headache or moderate pain.    [provider]  methenamine (HIPREX) 1 g tablet Take 1 g by mouth 2 (two) times daily with a meal.    [provider]  methocarbamol (ROBAXIN) 500 MG tablet  TAKE 1 TABLET BY MOUTH THREE TIMES A DAY 04/21/22   Sater, Nanine Means, MD  modafinil (PROVIGIL) 100 MG tablet TAKE 1 TABLET (100 MG TOTAL) BY MOUTH IN THE MORNING. *SENT FOR PA* Patient taking differently: Take 100 mg by mouth daily. 11/29/20   Kathrynn Ducking, MD  ocrelizumab 600 mg in sodium chloride 0.9 % 500 mL Inject 600 mg into the vein every 6 (six) months.     [provider]  ondansetron (ZOFRAN-ODT) 4 MG disintegrating tablet 4mg  ODT q4 hours prn nausea/vomit Patient not taking: Reported on 08/19/2022 08/16/22   Elnora Morrison, MD  Oxcarbazepine (TRILEPTAL) 300 MG tablet Take 600 mg by mouth 2 (two) times daily. 05/28/22   [provider]   oxyCODONE-acetaminophen (PERCOCET) 5-325 MG tablet Take 1 tablet by mouth every 6 (six) hours as needed for severe pain. 09/09/22 09/09/23  Sater, Nanine Means, MD  potassium chloride (KLOR-CON M) 10 MEQ tablet Take 1 tablet (10 mEq total) by mouth 2 (two) times daily for 5 days. 08/21/22 08/26/22  Bonnielee Haff, MD  Potassium Chloride 10 MEQ PACK Take 10 mEq by mouth daily. 08/30/22   Kingsley, [place]    Review of Systems   Review of Systems  Constitutional:  Positive for fatigue. Negative for chills, diaphoresis and fever.  HENT:  Negative for congestion, rhinorrhea and sneezing.   Eyes: Negative.   Respiratory:  Negative for cough, chest tightness and shortness of breath.   Cardiovascular:  Negative for chest pain and leg swelling.  Gastrointestinal:  Positive for abdominal pain, nausea and vomiting. Negative for blood in stool and diarrhea.  Genitourinary:  Negative for difficulty urinating, flank pain, frequency and hematuria.  Musculoskeletal:  Negative for arthralgias and back pain.  Skin:  Negative for rash.  Neurological:  Positive for weakness (Generalized). Negative for dizziness, speech difficulty, numbness and headaches.    Physical Exam Updated Vital Signs BP 99/66 (BP Location: Right Arm)   Pulse 68   Temp 98.1 F (36.7 C) (Oral)   Resp 14   Ht 5\' 10"  (1.778 m)   Wt 95 kg   SpO2 97%   BMI 30.05 kg/m  Physical Exam Constitutional:      Appearance: He is well-developed.  HENT:     Head: Normocephalic and atraumatic.  Eyes:     Pupils: Pupils are equal, round, and reactive to light.  Cardiovascular:     Rate and Rhythm: Normal rate and regular rhythm.     Heart sounds: Normal heart sounds.  Pulmonary:     Effort: Pulmonary effort is normal. No respiratory distress.     Breath sounds: Normal breath sounds. No wheezing or rales.  Chest:     Chest wall: No tenderness.  Abdominal:     General: Bowel sounds are normal.      Palpations: Abdomen is soft.     Tenderness: There is no abdominal tenderness. There is no guarding or rebound.  Musculoskeletal:        General: Normal range of motion.     Cervical back: Normal range of motion and neck supple.     Comments: Some mild swelling to lower extremities bilaterally which is baseline per wife.  Lymphadenopathy:     Cervical: No cervical adenopathy.  Skin:    General: Skin is warm and dry.     Findings: No rash.  Neurological:     Mental Status: He is alert.  Comments: Awake and alert     ED Results / Procedures / Treatments   Labs (all labs ordered are listed, but only abnormal results are displayed) Labs Reviewed  COMPREHENSIVE METABOLIC PANEL - Abnormal; Notable for the following components:      Result Value   Potassium 2.3 (*)    Chloride 94 (*)    CO2 33 (*)    Glucose, Bld 111 (*)    AST 11 (*)    All other components within normal limits  URINALYSIS, ROUTINE W REFLEX MICROSCOPIC - Abnormal; Notable for the following components:   APPearance CLOUDY (*)    Hgb urine dipstick LARGE (*)    Ketones, ur >80 (*)    Protein, ur 30 (*)    Leukocytes,Ua LARGE (*)    RBC / HPF >50 (*)    WBC, UA >50 (*)    Bacteria, UA RARE (*)    All other components within normal limits  BASIC METABOLIC PANEL - Abnormal; Notable for the following components:   Potassium 2.9 (*)    Chloride 96 (*)    Glucose, Bld 103 (*)    Calcium 8.8 (*)    All other components within normal limits  RESP PANEL BY RT-PCR (RSV, FLU A&B, COVID)  RVPGX2  LIPASE, BLOOD  CBC  LACTIC ACID, PLASMA    EKG EKG Interpretation  Date/Time:  Sunday September 27 2022 08:17:23 EST Ventricular Rate:  70 PR Interval:  179 QRS Duration: 100 QT Interval:  443 QTC Calculation: 478 R Axis:   -37 Text Interpretation: Sinus rhythm Left axis deviation Low voltage, precordial leads Abnormal R-wave progression, early transition Borderline prolonged QT interval since last tracing no  significant change Confirmed by Malvin Johns [phone]) on 09/27/2022 9:19:46 AM  Radiology CT ABDOMEN PELVIS WO CONTRAST  Result Date: 09/27/2022 CLINICAL DATA:  61 year old male with acute abdominal and pelvic pain with nausea and vomiting. EXAM: CT ABDOMEN AND PELVIS WITHOUT CONTRAST TECHNIQUE: Multidetector CT imaging of the abdomen and pelvis was performed following the standard protocol without IV contrast. RADIATION DOSE REDUCTION: This exam was performed according to the departmental dose-optimization program which includes automated exposure control, adjustment of the mA and/or kV according to patient size and/or use of iterative reconstruction technique. COMPARISON:  02/23/2020 CT and prior studies FINDINGS: Please note that parenchymal and vascular abnormalities may be missed as intravenous contrast was not administered. Lower chest: Mild bibasilar atelectasis/scarring again noted. Hepatobiliary: No significant hepatic or gallbladder abnormalities noted. There is no evidence of intrahepatic or extrahepatic biliary dilatation. Pancreas: Unremarkable Spleen: Unremarkable Adrenals/Urinary Tract: The kidneys and adrenal glands are unremarkable. A Foley catheter within the bladder is again noted as well as unchanged mild circumferential bladder wall thickening. There may be a small calculus within the bladder. Stomach/Bowel: Stomach is within normal limits. Appendix appears normal. No evidence of bowel wall thickening, distention, or inflammatory changes. Fluid within the rectum and some small bowel loops may indicate a diarrheal state. Mild chronic stranding in the perirectal region is unchanged from 2021. Vascular/Lymphatic: Aortic atherosclerosis. No enlarged abdominal or pelvic lymph nodes. Reproductive: No significant change or definite abnormality Other: No ascites, focal collection or pneumoperitoneum. Musculoskeletal: No acute or suspicious bony abnormalities are noted. 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 IMPRESSION: 1. No evidence of acute abnormality. 2. Fluid within the rectum and some small bowel loops may indicate a diarrheal state. 3. Unchanged mild circumferential bladder wall thickening with Foley catheter  in place. Possible small bladder calculus. 4.  Aortic Atherosclerosis (ICD10-I70.0). Electronically Signed   By: Margarette Canada M.D.   On: 09/27/2022 09:19    Procedures Procedures    Medications Ordered in ED Medications  sodium chloride 0.9 % bolus 1,000 mL ( Intravenous Stopped 09/27/22 1430)  potassium chloride 10 mEq in 100 mL IVPB (0 mEq Intravenous Stopped 09/27/22 1354)  potassium chloride SA (KLOR-CON M) CR tablet 40 mEq (40 mEq Oral Given 09/27/22 0926)  ciprofloxacin (CIPRO) tablet 500 mg (500 mg Oral Given 09/27/22 1434)    ED Course/ Medical Decision Making/ A&P                           Medical Decision Making Amount and/or Complexity of Data Reviewed Labs: ordered. Radiology: ordered.  Risk Prescription drug management.   Patient is a 61 year old male who presents with weakness and some vomiting.  He has had a history of recurrent UTIs and also has had trouble with his potassium being low.  Today his potassium is markedly low at 2.3.  He was given IV potassium replacement, 4 rounds.  We ordered oral potassium replacement but he was not able to tolerate it.  His urinalysis also appears to indicate infection.  On chart review, it looks like he is been treated for Pseudomonas as well as Klebsiella.  Mostly they have both been sensitive to Cipro.  He was given a dose of Cipro in the ED.  His lactate is normal.  CT scan of his abdomen pelvis does not show any acute abnormalities.  No obstruction or signs of infection.  His blood pressure is a little bit on the low side.  He is not tachycardic.  His WBC count is not elevated.  I strongly recommended admission both for the infection and the markedly low  potassium.  They were adamant about not being admitted.  Patient is refusing admission.  I did recheck his potassium and it is 2.9.  I again urged both the patient and his wife that patient needs to be admitted.  Patient is still refusing.  I have a concern that patient is not can be able to take the oral potassium.  He was last time prescribed oral potassium when she was not able to tolerate.  He then was prescribed the potassium powder packets but the insurance did not approve this.  Patient feels that he can take the oral potassium now.  He is still refusing admission.  I told him that they would be leaving AMA and they acknowledge this.  I advised him to come back anytime if they change their mind or he is feeling worse.  I advised that he needs close follow-up with his primary care doctor for recheck and also to retest his potassium.  He was prescribed Cipro for his UTI.  The wife indicates that he has his potassium tablets from his last prescription at home that he will take.  CRITICAL CARE Performed by: Malvin Johns Total critical care time: 90 minutes Critical care time was exclusive of separately billable procedures and treating other patients. Critical care was necessary to treat or prevent imminent or life-threatening deterioration. Critical care was time spent personally by me on the following activities: development of treatment plan with patient and/or surrogate as well as nursing, discussions with consultants, evaluation of patient's response to treatment, examination of patient, obtaining history from patient or surrogate, ordering and performing treatments and interventions, ordering and review  of laboratory studies, ordering and review of radiographic studies, pulse oximetry and re-evaluation of patient's condition.   Final Clinical Impression(s) / ED Diagnoses Final diagnoses:  Acute cystitis without hematuria  Hypokalemia    Rx / DC Orders ED Discharge Orders           Ordered    ciprofloxacin (CIPRO) 500 MG tablet  Every 12 hours        09/27/22 1445              Rolan Bucco, MD 09/27/22 1530    Rolan Bucco, MD 09/27/22 1530

## 2022-10-19 ENCOUNTER — Emergency Department (HOSPITAL_COMMUNITY): Payer: No Typology Code available for payment source

## 2022-10-19 ENCOUNTER — Inpatient Hospital Stay (HOSPITAL_COMMUNITY)
Admission: EM | Admit: 2022-10-19 | Discharge: 2022-10-24 | DRG: 698 | Disposition: A | Discharge status: Home Health | Payer: No Typology Code available for payment source | Attending: Internal Medicine | Admitting: Internal Medicine

## 2022-10-19 ENCOUNTER — Other Ambulatory Visit: Payer: Self-pay

## 2022-10-19 DIAGNOSIS — N3000 Acute cystitis without hematuria: Secondary | ICD-10-CM | POA: Diagnosis not present

## 2022-10-19 DIAGNOSIS — N319 Neuromuscular dysfunction of bladder, unspecified: Secondary | ICD-10-CM | POA: Diagnosis present

## 2022-10-19 DIAGNOSIS — Z66 Do not resuscitate: Secondary | ICD-10-CM | POA: Diagnosis present

## 2022-10-19 DIAGNOSIS — L89310 Pressure ulcer of right buttock, unstageable: Secondary | ICD-10-CM | POA: Diagnosis present

## 2022-10-19 DIAGNOSIS — E876 Hypokalemia: Secondary | ICD-10-CM

## 2022-10-19 DIAGNOSIS — G8250 Quadriplegia, unspecified: Secondary | ICD-10-CM | POA: Diagnosis present

## 2022-10-19 DIAGNOSIS — R627 Adult failure to thrive: Secondary | ICD-10-CM | POA: Diagnosis present

## 2022-10-19 DIAGNOSIS — G35 Multiple sclerosis: Secondary | ICD-10-CM | POA: Diagnosis present

## 2022-10-19 DIAGNOSIS — Z1623 Resistance to quinolones and fluoroquinolones: Secondary | ICD-10-CM | POA: Diagnosis present

## 2022-10-19 DIAGNOSIS — L8990 Pressure ulcer of unspecified site, unspecified stage: Secondary | ICD-10-CM | POA: Diagnosis not present

## 2022-10-19 DIAGNOSIS — J9809 Other diseases of bronchus, not elsewhere classified: Secondary | ICD-10-CM | POA: Diagnosis present

## 2022-10-19 DIAGNOSIS — Y846 Urinary catheterization as the cause of abnormal reaction of the patient, or of later complication, without mention of misadventure at the time of the procedure: Secondary | ICD-10-CM | POA: Diagnosis present

## 2022-10-19 DIAGNOSIS — Z7401 Bed confinement status: Secondary | ICD-10-CM

## 2022-10-19 DIAGNOSIS — Z820 Family history of epilepsy and other diseases of the nervous system: Secondary | ICD-10-CM

## 2022-10-19 DIAGNOSIS — Z1152 Encounter for screening for COVID-19: Secondary | ICD-10-CM

## 2022-10-19 DIAGNOSIS — E860 Dehydration: Secondary | ICD-10-CM | POA: Diagnosis present

## 2022-10-19 DIAGNOSIS — J45909 Unspecified asthma, uncomplicated: Secondary | ICD-10-CM | POA: Diagnosis present

## 2022-10-19 DIAGNOSIS — G500 Trigeminal neuralgia: Secondary | ICD-10-CM | POA: Diagnosis not present

## 2022-10-19 DIAGNOSIS — T83518A Infection and inflammatory reaction due to other urinary catheter, initial encounter: Secondary | ICD-10-CM | POA: Diagnosis present

## 2022-10-19 DIAGNOSIS — T43226A Underdosing of selective serotonin reuptake inhibitors, initial encounter: Secondary | ICD-10-CM | POA: Diagnosis present

## 2022-10-19 DIAGNOSIS — T83511S Infection and inflammatory reaction due to indwelling urethral catheter, sequela: Secondary | ICD-10-CM | POA: Diagnosis not present

## 2022-10-19 DIAGNOSIS — E43 Unspecified severe protein-calorie malnutrition: Secondary | ICD-10-CM | POA: Diagnosis present

## 2022-10-19 DIAGNOSIS — B965 Pseudomonas (aeruginosa) (mallei) (pseudomallei) as the cause of diseases classified elsewhere: Secondary | ICD-10-CM | POA: Diagnosis present

## 2022-10-19 DIAGNOSIS — F32A Depression, unspecified: Secondary | ICD-10-CM | POA: Diagnosis present

## 2022-10-19 DIAGNOSIS — L89320 Pressure ulcer of left buttock, unstageable: Secondary | ICD-10-CM | POA: Diagnosis present

## 2022-10-19 DIAGNOSIS — J9819 Other pulmonary collapse: Secondary | ICD-10-CM | POA: Diagnosis present

## 2022-10-19 DIAGNOSIS — Z79899 Other long term (current) drug therapy: Secondary | ICD-10-CM

## 2022-10-19 DIAGNOSIS — R1312 Dysphagia, oropharyngeal phase: Secondary | ICD-10-CM | POA: Diagnosis present

## 2022-10-19 DIAGNOSIS — Z91138 Patient's unintentional underdosing of medication regimen for other reason: Secondary | ICD-10-CM

## 2022-10-19 DIAGNOSIS — N3090 Cystitis, unspecified without hematuria: Secondary | ICD-10-CM | POA: Diagnosis present

## 2022-10-19 DIAGNOSIS — E872 Acidosis, unspecified: Secondary | ICD-10-CM | POA: Diagnosis present

## 2022-10-19 DIAGNOSIS — Z87891 Personal history of nicotine dependence: Secondary | ICD-10-CM

## 2022-10-19 DIAGNOSIS — R001 Bradycardia, unspecified: Secondary | ICD-10-CM | POA: Diagnosis not present

## 2022-10-19 DIAGNOSIS — N390 Urinary tract infection, site not specified: Secondary | ICD-10-CM | POA: Diagnosis present

## 2022-10-19 DIAGNOSIS — Z6825 Body mass index (BMI) 25.0-25.9, adult: Secondary | ICD-10-CM

## 2022-10-19 DIAGNOSIS — R7989 Other specified abnormal findings of blood chemistry: Secondary | ICD-10-CM | POA: Diagnosis present

## 2022-10-19 DIAGNOSIS — T43696A Underdosing of other psychostimulants, initial encounter: Secondary | ICD-10-CM | POA: Diagnosis present

## 2022-10-19 DIAGNOSIS — D649 Anemia, unspecified: Secondary | ICD-10-CM | POA: Diagnosis present

## 2022-10-19 DIAGNOSIS — Z87440 Personal history of urinary (tract) infections: Secondary | ICD-10-CM

## 2022-10-19 DIAGNOSIS — J984 Other disorders of lung: Secondary | ICD-10-CM | POA: Diagnosis present

## 2022-10-19 DIAGNOSIS — J189 Pneumonia, unspecified organism: Secondary | ICD-10-CM

## 2022-10-19 DIAGNOSIS — Z993 Dependence on wheelchair: Secondary | ICD-10-CM

## 2022-10-19 DIAGNOSIS — R269 Unspecified abnormalities of gait and mobility: Secondary | ICD-10-CM | POA: Diagnosis present

## 2022-10-19 DIAGNOSIS — Z888 Allergy status to other drugs, medicaments and biological substances status: Secondary | ICD-10-CM

## 2022-10-19 DIAGNOSIS — Z801 Family history of malignant neoplasm of trachea, bronchus and lung: Secondary | ICD-10-CM

## 2022-10-19 DIAGNOSIS — A419 Sepsis, unspecified organism: Principal | ICD-10-CM

## 2022-10-19 DIAGNOSIS — M25519 Pain in unspecified shoulder: Secondary | ICD-10-CM | POA: Diagnosis present

## 2022-10-19 LAB — URINALYSIS, ROUTINE W REFLEX MICROSCOPIC
Glucose, UA: NEGATIVE mg/dL
Ketones, ur: >80 mg/dL — AB
Nitrite: NEGATIVE
Protein, ur: 100 mg/dL — AB
Specific Gravity, Urine: 1.015 (ref 1.005–1.030)
pH: 7 (ref 5.0–8.0)

## 2022-10-19 LAB — CBC WITH DIFFERENTIAL/PLATELET
Abs Immature Granulocytes: 0.25 10*3/uL — ABNORMAL HIGH (ref 0.00–0.07)
Basophils Absolute: 0.1 10*3/uL (ref 0.0–0.1)
Basophils Relative: 0 %
Eosinophils Absolute: 0 10*3/uL (ref 0.0–0.5)
Eosinophils Relative: 0 %
HCT: 47.9 % (ref 39.0–52.0)
Hemoglobin: 15.7 g/dL (ref 13.0–17.0)
Immature Granulocytes: 1 %
Lymphocytes Relative: 1 %
Lymphs Abs: 0.3 10*3/uL — ABNORMAL LOW (ref 0.7–4.0)
MCH: 31 pg (ref 26.0–34.0)
MCHC: 32.8 g/dL (ref 30.0–36.0)
MCV: 94.5 fL (ref 80.0–100.0)
Monocytes Absolute: 1.1 10*3/uL — ABNORMAL HIGH (ref 0.1–1.0)
Monocytes Relative: 4 %
Neutro Abs: 23.1 10*3/uL — ABNORMAL HIGH (ref 1.7–7.7)
Neutrophils Relative %: 94 %
Platelets: 299 10*3/uL (ref 150–400)
RBC: 5.07 MIL/uL (ref 4.22–5.81)
RDW: 15.3 % (ref 11.5–15.5)
WBC: 24.7 10*3/uL — ABNORMAL HIGH (ref 4.0–10.5)
nRBC: 0 % (ref 0.0–0.2)

## 2022-10-19 LAB — RESP PANEL BY RT-PCR (RSV, FLU A&B, COVID)  RVPGX2
Influenza A by PCR: NEGATIVE
Influenza B by PCR: NEGATIVE
Resp Syncytial Virus by PCR: NEGATIVE
SARS Coronavirus 2 by RT PCR: NEGATIVE

## 2022-10-19 LAB — COMPREHENSIVE METABOLIC PANEL
ALT: 12 U/L (ref 0–44)
AST: 28 U/L (ref 15–41)
Albumin: 3.7 g/dL (ref 3.5–5.0)
Alkaline Phosphatase: 91 U/L (ref 38–126)
Anion gap: 21 — ABNORMAL HIGH (ref 5–15)
BUN: 13 mg/dL (ref 6–20)
CO2: 26 mmol/L (ref 22–32)
Calcium: 9.3 mg/dL (ref 8.9–10.3)
Chloride: 94 mmol/L — ABNORMAL LOW (ref 98–111)
Creatinine, Ser: 1.16 mg/dL (ref 0.61–1.24)
GFR, Estimated: >60 mL/min (ref >60)
Glucose, Bld: 180 mg/dL — ABNORMAL HIGH (ref 70–99)
Potassium: 2.6 mmol/L — CL (ref 3.5–5.1)
Sodium: 141 mmol/L (ref 135–145)
Total Bilirubin: 1.9 mg/dL — ABNORMAL HIGH (ref 0.3–1.2)
Total Protein: 7.2 g/dL (ref 6.5–8.1)

## 2022-10-19 LAB — BASIC METABOLIC PANEL
Anion gap: 14 (ref 5–15)
BUN: 12 mg/dL (ref 6–20)
CO2: 27 mmol/L (ref 22–32)
Calcium: 8.5 mg/dL — ABNORMAL LOW (ref 8.9–10.3)
Chloride: 97 mmol/L — ABNORMAL LOW (ref 98–111)
Creatinine, Ser: 0.9 mg/dL (ref 0.61–1.24)
GFR, Estimated: >60 mL/min (ref >60)
Glucose, Bld: 104 mg/dL — ABNORMAL HIGH (ref 70–99)
Potassium: 2.8 mmol/L — ABNORMAL LOW (ref 3.5–5.1)
Sodium: 138 mmol/L (ref 135–145)

## 2022-10-19 LAB — URINALYSIS, MICROSCOPIC (REFLEX): RBC / HPF: >50 RBC/hpf (ref 0–5)

## 2022-10-19 LAB — TROPONIN I (HIGH SENSITIVITY)
Troponin I (High Sensitivity): 32 ng/L — ABNORMAL HIGH (ref <18)
Troponin I (High Sensitivity): 37 ng/L — ABNORMAL HIGH (ref <18)
Troponin I (High Sensitivity): 48 ng/L — ABNORMAL HIGH (ref <18)

## 2022-10-19 LAB — LACTIC ACID, PLASMA
Lactic Acid, Venous: 3.3 mmol/L (ref 0.5–1.9)
Lactic Acid, Venous: 3.6 mmol/L (ref 0.5–1.9)
Lactic Acid, Venous: 2.3 mmol/L (ref 0.5–1.9)

## 2022-10-19 LAB — MAGNESIUM: Magnesium: 1.8 mg/dL (ref 1.7–2.4)

## 2022-10-19 LAB — PHOSPHORUS: Phosphorus: 2.1 mg/dL — ABNORMAL LOW (ref 2.5–4.6)

## 2022-10-19 MED ORDER — METHOCARBAMOL 1000 MG/10ML IJ SOLN
500.0000 mg | Freq: Two times a day (BID) | INTRAVENOUS | Status: DC
  Administered 2022-10-19 – 2022-10-20 (×3): 500 mg via INTRAVENOUS
  Filled 2022-10-19: qty 5
  Filled 2022-10-19 (×2): qty 500

## 2022-10-19 MED ORDER — LACTATED RINGERS IV BOLUS (SEPSIS)
1000.0000 mL | Freq: Once | INTRAVENOUS | Status: AC
  Administered 2022-10-19: 1000 mL via INTRAVENOUS

## 2022-10-19 MED ORDER — HYDROMORPHONE HCL 1 MG/ML IJ SOLN
0.5000 mg | INTRAMUSCULAR | Status: DC | PRN
  Administered 2022-10-19: 0.5 mg via INTRAVENOUS
  Filled 2022-10-19: qty 0.5

## 2022-10-19 MED ORDER — SODIUM CHLORIDE 0.9 % IV SOLN
2.0000 g | Freq: Three times a day (TID) | INTRAVENOUS | Status: DC
  Administered 2022-10-19 – 2022-10-23 (×11): 2 g via INTRAVENOUS
  Filled 2022-10-19 (×11): qty 12.5

## 2022-10-19 MED ORDER — ENOXAPARIN SODIUM 40 MG/0.4ML IJ SOSY
40.0000 mg | PREFILLED_SYRINGE | INTRAMUSCULAR | Status: DC
  Administered 2022-10-19 – 2022-10-23 (×5): 40 mg via SUBCUTANEOUS
  Filled 2022-10-19 (×5): qty 0.4

## 2022-10-19 MED ORDER — HYDROMORPHONE HCL 1 MG/ML IJ SOLN: 1.0000 mg | Freq: Once | INTRAMUSCULAR | Status: DC

## 2022-10-19 MED ORDER — POTASSIUM CHLORIDE 10 MEQ/100ML IV SOLN
10.0000 meq | Freq: Once | INTRAVENOUS | Status: AC
  Administered 2022-10-19: 10 meq via INTRAVENOUS
  Filled 2022-10-19: qty 100

## 2022-10-19 MED ORDER — HYDROMORPHONE HCL 1 MG/ML IJ SOLN
0.5000 mg | Freq: Three times a day (TID) | INTRAMUSCULAR | Status: DC
  Administered 2022-10-19 – 2022-10-20 (×2): 0.5 mg via INTRAVENOUS
  Filled 2022-10-19 (×2): qty 0.5

## 2022-10-19 MED ORDER — IOHEXOL 350 MG/ML SOLN
75.0000 mL | Freq: Once | INTRAVENOUS | Status: AC | PRN
  Administered 2022-10-19: 75 mL via INTRAVENOUS

## 2022-10-19 MED ORDER — POTASSIUM CHLORIDE 10 MEQ/100ML IV SOLN
10.0000 meq | INTRAVENOUS | Status: AC
  Administered 2022-10-19 (×5): 10 meq via INTRAVENOUS
  Filled 2022-10-19 (×5): qty 100

## 2022-10-19 MED ORDER — METRONIDAZOLE 500 MG/100ML IV SOLN
500.0000 mg | Freq: Once | INTRAVENOUS | Status: DC
  Administered 2022-10-19: 500 mg via INTRAVENOUS
  Filled 2022-10-19: qty 100

## 2022-10-19 MED ORDER — LACTATED RINGERS IV BOLUS
1000.0000 mL | Freq: Once | INTRAVENOUS | Status: AC
  Administered 2022-10-19: 1000 mL via INTRAVENOUS

## 2022-10-19 MED ORDER — VANCOMYCIN HCL IN DEXTROSE 1-5 GM/200ML-% IV SOLN: 1000.0000 mg | Freq: Once | INTRAVENOUS | Status: DC

## 2022-10-19 MED ORDER — VANCOMYCIN HCL 2000 MG/400ML IV SOLN
2000.0000 mg | Freq: Once | INTRAVENOUS | Status: DC
  Filled 2022-10-19: qty 400

## 2022-10-19 MED ORDER — SODIUM CHLORIDE 0.9 % IV SOLN
2.0000 g | Freq: Once | INTRAVENOUS | Status: AC
  Administered 2022-10-19: 2 g via INTRAVENOUS
  Filled 2022-10-19: qty 12.5

## 2022-10-19 MED ORDER — HYDROMORPHONE HCL 1 MG/ML IJ SOLN
0.5000 mg | Freq: Once | INTRAMUSCULAR | Status: AC
  Administered 2022-10-19: 0.5 mg via INTRAVENOUS
  Filled 2022-10-19: qty 1

## 2022-10-19 MED ORDER — LACTATED RINGERS IV SOLN: INTRAVENOUS | Status: AC

## 2022-10-19 MED ORDER — SODIUM CHLORIDE 0.9 % IV SOLN: 2.0000 g | INTRAVENOUS | Status: DC

## 2022-10-19 MED ORDER — SODIUM CHLORIDE 0.9 % IV SOLN
500.0000 mg | INTRAVENOUS | Status: DC
  Administered 2022-10-19 – 2022-10-21 (×3): 500 mg via INTRAVENOUS
  Filled 2022-10-19 (×4): qty 5

## 2022-10-19 NOTE — Progress Notes (Signed)
Arrived to patient's room and RN reported she was able to place PIV. Fran Lowes, RN VAST

## 2022-10-19 NOTE — H&P (Cosign Needed)
Date: 10/19/2022               Patient Name:  [name] MRN: [id number]  DOB: [birth year] Age / Sex: 62 y.o., male   PCP: Reeves Dam, MD         Medical Service: Internal Medicine Teaching Service         Attending Physician: Dr. Kemper Durie, DO    First Contact: Dr. Iona Coach, MD Pager: [phone]  Second Contact: Dr. Christiana Fuchs, DO Pager: [phone]       After Hours (After 5p/  First Contact Pager: [phone]  weekends / holidays): Second Contact Pager: [phone]   Chief Complaint: poor PO intake  History of Present Illness: [name] is a 62 y/o male with a pmh of MS c/b quadriplegia and chronic foley catheter use, trigeminal neuralgia, depression who presents for poor PO intake since Thursday secondary to trigeminal neuralgia. The patient would intermittently use one to two word responses in response to pain but most of the history was obtained from his wife at the bedside. She says that since Thursday he has had almost no oral intake and has not taken his medications. All of this is due to pain from trigeminal neuralgia which has not been controlled with oxcarbazepine or nerve block. She says this frequently occurs, most recently around [place] although he was able to spontaneously improve at home. At [facility] he was having bilious vomiting that improved as he was able to resume PO intake. He has had dry heaving during this current episode which she attributes to not being able to eat.She says usually in these times he comes to the hospital for IVF and potassium repletion. Otherwise she reports some wheezing that she noted for the past two days and she feels his stools have been more frequent and higher volume, although they are always diarrhea. She says he has not mentioned dysuria although his urine is not infrequently red tinged to maroon red in color. She also mentions wounds over his spine that are healing and wound between his inner thigh and perineum that  are getting worse and now draining. She has noted that he has choking with swallowing but mainly tries pureed food. She denies evidence of headache,fever, chills, CP, SOB, cough.   ED Course: CXR no intrapulmonary process, CT chest abdomen pelvis with occluded LLL bronchus with findings consistent with pneumonia, bladder wall thickening with dependent stones consistent with cystitis, CT head no acute intracranial pathology, CT maxillofacial no acute intracranial pathology. CMP with K 2.6, Tbili 1.9, anion gap 21. Phos decreased to 2.1,MG wnl 1.8.  CBC with WBC 24.7 and left shift. Lactate elevated to 3.3<3.6.RPP negative. Troponin 32<37. S/p 3L IVF, IV cefepime, IV vanc, IV flagyl. 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 Wheelchair bound since 2009. Requiring lift for transfer since 2021.Able to speak minimally, requires pureed diet, able to feed himself with his left hand. Patient of the [facility] and sees his neurologist Dr. Epimenio Foot with [facility].  Review of Systems: A complete ROS was  negative except as per HPI.   Physical Exam: Blood pressure 107/75, pulse 99, temperature 98.5 F (36.9 C), temperature source Oral, resp. rate (!) 21, SpO2 96 %. Gen: Chronically ill appearing, acute distress, non-toxic HEENT: Poor dentition, dry mucous membranes CV:RRR, normal s1/s2, 2+ bilateral radial pulses Pulm:normal wob, LCTAB at the anterior lung fields Extremities: cool, dry, bilateral DP not palpable, R LE 1+pitting edema to the mid shin Skin: Healing spinal pressure wounds stage 2 per pictures on the wifes phone, bilateral erythematous and indurated stage 2 wounds of medial gluteal folds  EKG: personally reviewed my interpretation is erratic baseline difficult to interpret, no apparent ST/T wave changes  CXR: personally reviewed my interpretation is no consolidation, no increased interstitial opacities, no effusions, no pneumothorax  Assessment & Plan by Problem: Active Problems:   * No active hospital problems. *  Anion gap metabolic acidosisleukocytosis ? CAP vs cystitis Patient presents due to no PO intake and loss of functional status since Thursday.CXR negative for acute cardiopulmonary process.CT chest abdomen pelvis with occluded LLL bronchus with findings consistent with pneumonia bladder wall thickening with dependent stones consistent with cystitis. [name] well on RA with normal WOB and no cough, does not seem clinically consistent with pneumonia. He does have risk factors for pneumonia such as aspiration, although the distribution is not classic, as well as immobility and poor breathing mechanics secondary to MS. He appears to have chronic UTI vs colonization, which is treated based on AMS and weakness. The patient is having  weakness in addition to visible blood tinging of the urine and CT evidence of bladder inflammation. His UA is also with >50RBC, 6-10 WBC, few bacteria and no squamous cells indicating likely good specimen consistent with UTI. It is  difficult in an individual with a chronic foley catheter to determine colonization vs active infection. Will follow up culture to see if he grows a different organism than prior. Do not think his medial gluteal fold wounds are causing his symptoms. Will also need to follow blood cultures. Does not meet sepsis criteria per sofa score given that his only evidence of end organ damage is mildly elevated Tbili to 1.9.Lactate up trending 3.3<3.6Does not meet lactic acidosis criteria but do suspect his elevated lactate, anion gap metabolic acidosis are secondary to infection.S/p 3L IVF.  -daily cbc -F/u lactate -F/u Ucx, Bcx -discontinue IV metronidazole -discontinue IV vanc -IV cefepime, IV azithromycin, per pharmacy dosing  Bladder colonization Indwelling foley catheterUTI Foley changed once monthley, changed in the ED on admission. Per neurology takes cipro or amoxicillin for UTIs if getting weaker or having cognitive changes. During current admission CT with bladder wall thickening. Patient does have altered mentation and weakness although this also appears to happen when his PO intake decreases. On exam his urine is slightly blood tinged. Has grown >100k  CFU pseudomonas and 50-100K CFU E. Faecalis at [facility] 11/2020, >100K pseudomonas 04/2022 sensitive to cefepime. Most recently grew <10K CFU in his urine 08/2022.Hasn't take Methenamine in a month which usually helps with UTI prevention. -IV cefepime -per pharmacy dosing -F/u urine culture  Trigeminal Neuralgia Diagnosed in July 2023, no evidence of etiology on MRI imaging.Home meds also include oxcarbazepine for trigeminal neuralgia and gabapentin for dysethesias, although the patient's spouse reports he has not taken these for at least a month.Had nerve block performed in 08/2022 and it appears this has not had much benefit. He is having recurrent flairs causing him to get dehydrated and hypokalemic requiring hospitalization. Currently not able to take PO  intake, limited on neuropathic pain meds for this. Although not first line will try IV opioids and IV robaxen as baclofen is only oral. -Dilaudid 0.5mg  IV q2hrs prn -Robaxin 500mg  IV q12 hrs  Stage 2 bilateral medial gluteal fold wounds Bilateral wounds of the medial gluteal folds noted with surrounding erythema, induration, and serosanguinous appearing drainage. These have been evolving over the past weeks and seem to be in an area where the patient's loose stools are likely causing skin breakdown. -wound consult  Elevated troponin Troponins minimally elevated to 32<37. No ST/T wave changes on EKG. Suspect this is in the setting of demand from infection. -F/u repeat troponins  MS Spastic quadriplegia Sees Dr. with [facility], last office visit 06/2022. Diagnosed in 2007, questionable symptoms dating back to 2001.No MS exacerbation since 2018-19 on Ocrevus.  -SLP eval  History of depression -Holding Lexapro  Dispo: Admit patient to Inpatient with expected length of stay greater than 2 midnights.  Signed: 07-[birth year]1988, MD 10/19/2022, 4:03 PM  Pager: 12/18/2022, MD Internal Medicine Resident, PGY-1 [facility]  Pager: [phone] After 5pm on weekdays and 1pm on weekends: On Call pager: [phone]

## 2022-10-19 NOTE — ED Provider Notes (Signed)
[facility] EMERGENCY DEPARTMENT Provider Note   CSN: [id number] Arrival date & time: 10/19/22  5284     History  Chief Complaint  Patient presents with   Failure to Thrive    [name] is a 62 y.o. male.  HPI Patient presents for facial pain and p.o. intolerance.  Medical history includes MS, neurogenic bladder, spastic quadriparesis, restrictive lung disease, depression, asthma, migraines.  Initial history is provided by EMS.  Patient lives at home with wife who is his caregiver.  He is bedbound at baseline due to his advanced MS.  He developed right-sided facial pain 5 days ago.  They were told this is trigeminal neuralgia.  He was not started on any new medications.  He just takes oxycodone for analgesia at home.  Because of his facial pain, he has not eaten or drank in the past 5 days.  He has had very little stool or urine output.  Patient, himself, endorses right-sided facial pain.  He denies any other areas of discomfort.  He has an indwelling Foley at baseline.  He has developed pressure wounds which have been treated at home with Neosporin and dressing.    Home Medications Prior to Admission medications   Medication Sig Start Date End Date Taking? Authorizing Provider  acetaminophen (TYLENOL) 325 MG tablet Take 2 tablets (650 mg total) by mouth every 6 (six) hours as needed for mild pain, fever or moderate pain (or Fever >/= 101). 03/12/22   Azucena Fallen, MD  albuterol (VENTOLIN HFA) 108 (90 Base) MCG/ACT inhaler Inhale 2 puffs into the lungs every 6 (six) hours as needed. Patient taking differently: Inhale 1-2 puffs into the lungs every 6 (six) hours as needed for wheezing. 07/24/21   York Spaniel, MD  Ascorbic Acid (VITAMIN C) 100 MG tablet Take 100 mg by mouth daily.    [provider]  baclofen (LIORESAL) 10 MG tablet 1/2 tablet in the morning and at noon, and 2 tablets at night Patient taking differently: Take 20 mg by mouth daily  as needed for muscle spasms. 05/20/16   York Spaniel, MD  Carboxymethylcellulose Sod PF (THERATEARS) 1 % GEL Place 1 drop into both eyes daily as needed (dry eyes).    [provider]  cholecalciferol (VITAMIN D3) 25 MCG (1000 UNIT) tablet Take 1,000 Units by mouth daily.    [provider]  ciprofloxacin (CIPRO) 500 MG tablet Take 1 tablet (500 mg total) by mouth every 12 (twelve) hours. 09/27/22   Rolan Bucco, MD  D-MANNOSE PO Take 1,300 mg by mouth See admin instructions. 1300 mg with cranberry extract for uti    [provider]  Dextromethorphan-Quinidine 20-10 MG CAPS Take 1 tablet by mouth 2 (two) times daily. Patient not taking: Reported on 08/19/2022 05/27/17   York Spaniel, MD  escitalopram (LEXAPRO) 10 MG tablet Take 10 mg by mouth daily.    [provider]  gabapentin (NEURONTIN) 100 MG capsule Take 1 capsule (100 mg total) by mouth at bedtime. Patient taking differently: Take 100 mg by mouth daily as needed (pain). 01/20/21   York Spaniel, MD  ibuprofen (ADVIL) 200 MG tablet Take 600 mg by mouth every 6 (six) hours as needed for headache or moderate pain.    [provider]  methenamine (HIPREX) 1 g tablet Take 1 g by mouth 2 (two) times daily with a meal.    [provider]  methocarbamol (ROBAXIN) 500 MG tablet TAKE 1  TABLET BY MOUTH THREE TIMES A DAY 04/21/22   Sater, Nanine Means, MD  modafinil (PROVIGIL) 100 MG tablet TAKE 1 TABLET (100 MG TOTAL) BY MOUTH IN THE MORNING. *SENT FOR PA* Patient taking differently: Take 100 mg by mouth daily. 11/29/20   Kathrynn Ducking, MD  ocrelizumab 600 mg in sodium chloride 0.9 % 500 mL Inject 600 mg into the vein every 6 (six) months.     [provider]  ondansetron (ZOFRAN-ODT) 4 MG disintegrating tablet 4mg  ODT q4 hours prn nausea/vomit Patient not taking: Reported on 08/19/2022 08/16/22   Elnora Morrison, MD  Oxcarbazepine (TRILEPTAL) 300 MG tablet Take 600 mg by mouth 2  (two) times daily. 05/28/22   [provider]  oxyCODONE-acetaminophen (PERCOCET) 5-325 MG tablet Take 1 tablet by mouth every 6 (six) hours as needed for severe pain. 09/09/22 09/09/23  Sater, Nanine Means, MD  potassium chloride (KLOR-CON M) 10 MEQ tablet Take 1 tablet (10 mEq total) by mouth 2 (two) times daily for 5 days. 08/21/22 08/26/22  Bonnielee Haff, MD  Potassium Chloride 10 MEQ PACK Take 10 mEq by mouth daily. 08/30/22   Kingsley, [place]    Review of Systems   Review of Systems  Constitutional:  Positive for appetite change.  HENT:         Right-sided facial pain  Skin:  Positive for wound.  All other systems reviewed and are negative.   Physical Exam Updated Vital Signs BP 107/75   Pulse 99   Temp 98.5 F (36.9 C) (Oral)   Resp (!) 21   SpO2 96%  Physical Exam Vitals and nursing note reviewed.  Constitutional:      General: He is not in acute distress.    Appearance: He is well-developed. He is ill-appearing (Chronically). He is not toxic-appearing or diaphoretic.  HENT:     Head: Normocephalic and atraumatic.     Right Ear: External ear normal.     Left Ear: External ear normal.     Nose: Nose normal.     Mouth/Throat:     Comments: Dry cracked lips, thick secretions in oral cavity.  Right-sided facial tenderness without swelling or erythema. Eyes:     Extraocular Movements: Extraocular movements intact.     Conjunctiva/sclera: Conjunctivae normal.  Cardiovascular:     Rate and Rhythm: Normal rate and regular rhythm.     Heart sounds: No murmur heard. Pulmonary:     Effort: Pulmonary effort is normal. No respiratory distress.     Breath sounds: Normal breath sounds. No wheezing or rales.  Chest:     Chest wall: No tenderness.  Abdominal:     General: There is no distension.     Palpations: Abdomen is soft.     Tenderness: There is no abdominal tenderness.  Genitourinary:    Comments: Indwelling Foley  catheter.  No urine in leg bag currently. Musculoskeletal:        General: No swelling, tenderness or deformity.     Cervical back: Neck supple.       Back:       Legs:     Comments: Skin breakdown on lower thoracic spine, skin cracks on right upper buttock, wounds to bilateral lower buttocks  Skin:    General: Skin is warm and dry.     Capillary Refill: Capillary refill takes less than 2 seconds.     Coloration: Skin is not jaundiced.  Neurological:  General: No focal deficit present.     Mental Status: He is alert.  Psychiatric:        Mood and Affect: Mood normal.        Behavior: Behavior normal.     ED Results / Procedures / Treatments   Labs (all labs ordered are listed, but only abnormal results are displayed) Labs Reviewed  LACTIC ACID, PLASMA - Abnormal; Notable for the following components:      Result Value   Lactic Acid, Venous 3.3 (*)    All other components within normal limits  LACTIC ACID, PLASMA - Abnormal; Notable for the following components:   Lactic Acid, Venous 3.6 (*)    All other components within normal limits  COMPREHENSIVE METABOLIC PANEL - Abnormal; Notable for the following components:   Potassium 2.6 (*)    Chloride 94 (*)    Glucose, Bld 180 (*)    Total Bilirubin 1.9 (*)    Anion gap 21 (*)    All other components within normal limits  CBC WITH DIFFERENTIAL/PLATELET - Abnormal; Notable for the following components:   WBC 24.7 (*)    Neutro Abs 23.1 (*)    Lymphs Abs 0.3 (*)    Monocytes Absolute 1.1 (*)    Abs Immature Granulocytes 0.25 (*)    All other components within normal limits  PHOSPHORUS - Abnormal; Notable for the following components:   Phosphorus 2.1 (*)    All other components within normal limits  TROPONIN I (HIGH SENSITIVITY) - Abnormal; Notable for the following components:   Troponin I (High Sensitivity) 32 (*)    All other components within normal limits  TROPONIN I (HIGH SENSITIVITY) - Abnormal; Notable for the  following components:   Troponin I (High Sensitivity) 37 (*)    All other components within normal limits  RESP PANEL BY RT-PCR (RSV, FLU A&B, COVID)  RVPGX2  CULTURE, BLOOD (ROUTINE X 2)  CULTURE, BLOOD (ROUTINE X 2)  URINE CULTURE  MAGNESIUM  URINALYSIS, ROUTINE W REFLEX MICROSCOPIC    EKG EKG Interpretation  Date/Time:  Monday October 19 2022 09:17:16 EST Ventricular Rate:  96 PR Interval:  162 QRS Duration: 153 QT Interval:  430 QTC Calculation: 547 R Axis:   -47 Text Interpretation: Sinus rhythm Nonspecific IVCD with LAD Inferior infarct, old Confirmed by Godfrey Pick [phone]) on 10/19/2022 11:20:30 AM  Radiology CT CHEST ABDOMEN PELVIS W CONTRAST  Result Date: 10/19/2022 CLINICAL DATA:  Sepsis.  Mental status change. EXAM: CT CHEST, ABDOMEN, AND PELVIS WITH CONTRAST TECHNIQUE: Multidetector CT imaging of the chest, abdomen and pelvis was performed following the standard protocol during bolus administration of intravenous contrast. RADIATION DOSE REDUCTION: This exam was performed according to the departmental dose-optimization program which includes automated exposure control, adjustment of the mA and/or kV according to patient size and/or use of iterative reconstruction technique. CONTRAST:  99mL OMNIPAQUE IOHEXOL 350 MG/ML SOLN COMPARISON:  CT abdomen pelvis, 09/27/2022. Chest radiograph, 10/19/2022. CTA chest, 02/23/2020. FINDINGS: CT CHEST FINDINGS Cardiovascular: Heart normal in size configuration. No pericardial effusion. Minimal right coronary artery calcification. Great vessels are normal in caliber. Arch branch vessels are widely patent. Mediastinum/Nodes: Normal thyroid. No neck base, mediastinal or hilar masses. No enlarged lymph nodes. Trachea and esophagus are unremarkable. Lungs/Pleura: Left lower lobe bronchus is occluded, presumably by mucous plugging. Patchy ground-glass and more confluent consolidation noted in the left lower lobe. Mild dependent opacity noted in the right  lower lobe consistent with atelectasis. Minimal dependent atelectasis in the left  upper lobe lingula and right middle lobe. Remainder of the lungs is clear. No pleural effusion.  No pneumothorax. Musculoskeletal: No fracture.  No bone lesion.  No chest wall mass. CT ABDOMEN PELVIS FINDINGS Hepatobiliary: Liver normal in overall size attenuation. There are several low-attenuation liver lesions, largest arising from the anterior liver just below the dome, 1.5 cm. Others are all subcentimeter. These all suspected to be cysts and are similar to the prior CT. Normal gallbladder. No bile duct dilation. Pancreas: Unremarkable. No pancreatic ductal dilatation or surrounding inflammatory changes. Spleen: Normal in size without focal abnormality. Adrenals/Urinary Tract: Normal adrenal glands. Bilateral renal cortical thinning. Multiple small, 5 mm in last, renal lesions, too small to characterize, likely cysts. No follow-up recommended. No stones. No hydronephrosis. Ureters normal in course and in caliber. Dependent density noted in the bladder consistent with stones. Bladder wall mildly thickened. Small amount of non dependent air. No bladder mass. Foley catheter lies within the bladder, well positioned. Stomach/Bowel: Normal stomach. Small bowel and colon are normal in caliber. No wall thickening. No inflammation. Normal appendix. Vascular/Lymphatic: Mild aortic atherosclerosis. No aneurysm. No enlarged lymph nodes. Reproductive: Unremarkable. Other: No ascites. Musculoskeletal: No acute fracture. No bone lesion. 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 No spondylolisthesis. IMPRESSION: 1. Occluded left lower lobe bronchus. Patchy opacities noted in the left lower lobe consistent with pneumonia. 2. Bladder wall thickening. Dependent density consistent with stones in the bladder. Suspect cystitis. No CT evidence of pyelonephritis and no findings of obstructive uropathy. 3. No other acute abnormalities within the chest, abdomen or  pelvis. Electronically Signed   By: Lajean Manes M.D.   On: 10/19/2022 15:31   CT Maxillofacial W Contrast  Result Date: 10/19/2022 CLINICAL DATA:  Sensory abnormality, trigeminal origin (CN 5) EXAM: CT MAXILLOFACIAL WITH CONTRAST TECHNIQUE: Multidetector CT imaging of the maxillofacial structures was performed with intravenous contrast. Multiplanar CT image reconstructions were also generated. RADIATION DOSE REDUCTION: This exam was performed according to the departmental dose-optimization program which includes automated exposure control, adjustment of the mA and/or kV according to patient size and/or use of iterative reconstruction technique. CONTRAST:  64mL OMNIPAQUE IOHEXOL 350 MG/ML SOLN COMPARISON:  CT head August 19, 2022. FINDINGS: Osseous: No fracture or mandibular dislocation. No destructive process. Orbits: Negative. No traumatic or inflammatory finding. Sinuses: Clear. Soft tissues: Negative. Limited intracranial: No significant or unexpected finding. IMPRESSION: No evidence of acute fracture.  Clear sinuses. Electronically Signed   By: Margaretha Sheffield M.D.   On: 10/19/2022 15:24   CT Head Wo Contrast  Result Date: 10/19/2022 CLINICAL DATA:  Sepsis, mental status change EXAM: CT HEAD WITHOUT CONTRAST TECHNIQUE: Contiguous axial images were obtained from the base of the skull through the vertex without intravenous contrast. RADIATION DOSE REDUCTION: This exam was performed according to the departmental dose-optimization program which includes automated exposure control, adjustment of the mA and/or kV according to patient size and/or use of iterative reconstruction technique. COMPARISON:  08/19/2022 FINDINGS: Brain: Stable atrophy pattern. No acute intracranial hemorrhage, mass lesion, new infarction, midline shift, herniation, hydrocephalus, or extra-axial fluid collection. No focal mass effect or edema. Cisterns are patent. No gross cerebellar abnormality. Vascular: No hyperdense vessel or  unexpected calcification. Skull: Normal. Negative for fracture or focal lesion. Sinuses/Orbits: No acute finding. Other: None. IMPRESSION: 1. Stable atrophy pattern. 2. No acute intracranial abnormality by noncontrast CT. No interval change. Electronically Signed   By: Jerilynn Mages.  Shick M.D.   On: 10/19/2022 15:24   DG Chest [facility] 1 View  Result  Date: 10/19/2022 CLINICAL DATA:  Sepsis.  Asthma EXAM: PORTABLE CHEST 1 VIEW COMPARISON:  08/19/2022 FINDINGS: Normal heart size. Low lung volumes. Minimal linear scarring or atelectasis bilaterally. Lungs are otherwise clear. No pleural effusion or pneumothorax. IMPRESSION: Low lung volumes with minimal linear scarring or atelectasis bilaterally. Electronically Signed   By: Davina Poke D.O.   On: 10/19/2022 08:21    Procedures Procedures    Medications Ordered in ED Medications  potassium chloride 10 mEq in 100 mL IVPB (10 mEq Intravenous New Bag/Given 10/19/22 1421)  lactated ringers infusion ( Intravenous New Bag/Given 10/19/22 1412)  ceFEPIme (MAXIPIME) 2 g in sodium chloride 0.9 % 100 mL IVPB (has no administration in time range)  metroNIDAZOLE (FLAGYL) IVPB 500 mg (has no administration in time range)  lactated ringers bolus 1,000 mL (has no administration in time range)  vancomycin (VANCOREADY) IVPB 2000 mg/400 mL (has no administration in time range)  lactated ringers bolus 1,000 mL (0 mLs Intravenous Stopped 10/19/22 0941)  lactated ringers bolus 1,000 mL (1,000 mLs Intravenous New Bag/Given 10/19/22 1121)  HYDROmorphone (DILAUDID) injection 0.5 mg (0.5 mg Intravenous Given 10/19/22 1138)  iohexol (OMNIPAQUE) 350 MG/ML injection 75 mL (75 mLs Intravenous Contrast Given 10/19/22 1513)    ED Course/ Medical Decision Making/ A&P                           Medical Decision Making Amount and/or Complexity of Data Reviewed Labs: ordered. Radiology: ordered. ECG/medicine tests: ordered.  Risk Prescription drug management. Decision regarding  hospitalization.   This patient presents to the ED for concern of p.o. intolerance, this involves an extensive number of treatment options, and is a complaint that carries with it a high risk of complications and morbidity.  The differential diagnosis includes trigeminal neuralgia, dysphagia, infection, dehydration, metabolic derangements   Co morbidities that complicate the patient evaluation  MS, neurogenic bladder, spastic quadriparesis, restrictive lung disease, depression, asthma, migraines   Additional history obtained:  Additional history obtained from patient's wife, EMS External records from outside source obtained and reviewed including EMR   Lab Tests:  I Ordered, and personally interpreted labs.  The pertinent results include: Large leukocytosis and lactic acidosis consistent with sepsis; hypokalemia   Imaging Studies ordered:  I ordered imaging studies including chest x-ray, CT of head, face, chest, abdomen, pelvis I independently visualized and interpreted imaging which showed left lower lobe pneumonia, bladder wall thickening I agree with the radiologist interpretation   Cardiac Monitoring: / EKG:  The patient was maintained on a cardiac monitor.  I personally viewed and interpreted the cardiac monitored which showed an underlying rhythm of: Sinus rhythm  Problem List / ED Course / Critical interventions / Medication management  Patient presents for right-sided facial pain and inability to tolerate p.o. intake for the past 5 days.  EMS reports normal blood pressure and heart rate of 96 prior to arrival.  On arrival, patient is chronically ill-appearing.  Endorses pain to the right side of his face.  He does appear to have allodynia to this area.  Lips are dry and there are thick secretions in his mouth consistent with dehydration.  His breathing is unlabored and his lungs are clear to auscultation.  He does not have any areas of deformities or significant tenderness.   Back and buttock wounds consistent with pressure injury without evidence of skin infection.  Patient was kept on bedside cardiac monitor.  IV fluids were initiated.  His  wife subsequently joined at bedside and was able to provide further history.  She states that patient has been dealing with trigeminal neuralgia over the past 6 months.  He has undergone nerve ablations for this.  He was initially on oxcarbazepine but this caused unwanted somnolence.  Currently, his pain is treated at home with Percocet.  Over the past couple days, he has had right-sided facial pain that worsens with swallowing.  Because of this, he has not been able to eat, drink, or take medication.  His Foley catheter was last changed out 3 weeks ago.  Foley bag was last emptied this morning.  Patient's lab work is notable for a large leukocytosis as well as a lactic acidosis.  Patient was treated empirically for sepsis with broad-spectrum antibiotics and additional IV fluids.  IV potassium replacement was initiated.  Patient is prone for urine infections and this is, at this point, likely source.  A CT scan of chest, abdomen, pelvis was ordered to further evaluate.  On CT, it does appear that he has a left lower lobe pneumonia.  He remained hemodynamically stable while in the ED.  His pain was improved following Dilaudid.  Patient to be admitted for further management. I ordered medication including IV fluids for dehydration; and broad-spectrum antibiotics for empiric treatment of sepsis; Dilaudid for analgesia; potassium chloride for hypokalemia Reevaluation of the patient after these medicines showed that the patient improved I have reviewed the patients home medicines and have made adjustments as needed   Social Determinants of Health:  Lives at home with wife, bedbound at baseline  CRITICAL CARE Performed by: Godfrey Pick   Total critical care time: 34 minutes  Critical care time was exclusive of separately billable procedures  and treating other patients.  Critical care was necessary to treat or prevent imminent or life-threatening deterioration.  Critical care was time spent personally by me on the following activities: development of treatment plan with patient and/or surrogate as well as nursing, discussions with consultants, evaluation of patient's response to treatment, examination of patient, obtaining history from patient or surrogate, ordering and performing treatments and interventions, ordering and review of laboratory studies, ordering and review of radiographic studies, pulse oximetry and re-evaluation of patient's condition.         Final Clinical Impression(s) / ED Diagnoses Final diagnoses:  Sepsis without acute organ dysfunction, due to unspecified organism [facility])  Hypokalemia  Pneumonia of left lower lobe due to infectious organism    Rx / DC Orders ED Discharge Orders     None         Godfrey Pick, MD 10/19/22 1546

## 2022-10-19 NOTE — Progress Notes (Signed)
Pharmacy Antibiotic Note  [name] is a 62 y.o. male for which pharmacy has been consulted for cefepime dosing for sepsis.  Patient with a history of MS c/b quadriplegia and chronic foley catheter use, trigeminal neuralgia, depression . Patient presenting with poor PO intake.  SCr 1.16 WBC 24.7; LA 3.6; T 98.5; HR 99; RR 21 COVID neg / flu neg  Plan: Azithromycin per MD Cefepime 2g q8hr Trend WBC, Fever, Renal function F/u cultures, clinical course, WBC De-escalate when able     Temp (24hrs), Avg:98.5 F (36.9 C), Min:98.5 F (36.9 C), Max:98.5 F (36.9 C)  Recent Labs  Lab 10/19/22 0925 10/19/22 0933 10/19/22 1200  WBC 24.7*  --   --   CREATININE 1.16  --   --   LATICACIDVEN  --  3.3* 3.6*    CrCl cannot be calculated (Unknown ideal weight.).    Allergies  Allergen Reactions   Gadopiclenol Nausea And Vomiting    Antimicrobials this admission: azith 1/8 >>  cefepime 1/8 >>  Microbiology results: Pending  Thank you for allowing pharmacy to be a part of this patient's care.  Lorelei Pont, PharmD, BCPS 10/19/2022 1:55 PM ED Clinical Pharmacist -  [phone]

## 2022-10-19 NOTE — ED Notes (Signed)
ED TO INPATIENT HANDOFF REPORT  ED Nurse Name and Phone #: Whitney [phone]  S Name/Age/Gender [name] 62 y.o. male Room/Bed: 025C/025C  Code Status   Code Status: DNR  Home/SNF/Other Home Patient oriented to: self, place, time, and situation Is this baseline? Yes   Triage Complete: Triage complete  Chief Complaint Trigeminal neuralgia of right side of face [G50.0]  Triage Note Pt was BIB by [facility] coming from home. Pt has hx of MS and muscular dystrophy. 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 Pt was prescribed oxy for pain management but cannot take it orally bc of the pain. For the past two weeks pt has not been eating much, he has also not had any pain medication since Wednesday. Pt has decreased urine output. Wife wanted pt evaluated. Pt also has pressure ulcers on his back and thighs.   LVS   116/78  HR 96 95% RA  97.9    Allergies Allergies  Allergen Reactions   Gadopiclenol Nausea And Vomiting    Level of Care/Admitting Diagnosis ED Disposition     ED Disposition  Admit   Condition  --   Comment  Hospital Area: [facility] [[id number]]  Level of Care: Med-Surg [16]  May admit patient to [facility] or [facility] if equivalent level of care is available:: No  Covid Evaluation: Asymptomatic - no recent exposure (last 10 days) testing not required  Diagnosis: Trigeminal neuralgia of right side of face [[id number]]  Admitting Physician: Charise Killian [[id number]]  Attending Physician: Charise Killian [[id number]]  Certification:: I certify this patient will need inpatient services for at least 2 midnights  Estimated Length of Stay: 4          B Medical/Surgery History Past Medical History:  Diagnosis Date   Abnormality of gait 11/21/2015   Asthma    childhood asthma   Classic migraine    Depression    Dysphagia    Hay fever    Headache syndrome 12/22/2018   MS (multiple  sclerosis) ([place])    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 08/19/22  1000  Non-latex  61   Urethral Catheter Butch Penny Panther Straight-tip;Double-lumen 16 Fr. 09/27/22  1059  Straight-tip;Double-lumen  22   Pressure Injury 08/20/22 Buttocks Right Stage 2 -  Partial thickness loss of dermis presenting as a shallow open injury with a red, pink wound bed without slough. small open area 08/20/22  1412  -- 60   Wound / Incision (Open or Dehisced) 03/11/22 (ITD) Intertriginous Dermatitis Pretibial Distal;Left;Right 03/11/22  2000  Pretibial  222            Intake/Output Last 24 hours  Intake/Output Summary (Last 24 hours) at 10/19/2022 1754 Last data filed at 10/19/2022 1744 Gross per 24 hour  Intake 2100 ml  Output --  Net 2100 ml    Labs/Imaging Results for orders placed or performed during the hospital encounter of 10/19/22 (from the past 48 hour(s))  Comprehensive metabolic panel     Status: Abnormal   Collection Time: 10/19/22  9:25 AM  Result Value Ref Range   Sodium 141 135 - 145 mmol/L   Potassium 2.6 (LL) 3.5 - 5.1 mmol/L    Comment: CRITICAL RESULT CALLED TO, READ BACK BY AND VERIFIED WITH M.FRANCIS,RN 1044 10/19/22 CLARK,S   Chloride 94 (L) 98 - 111 mmol/L   CO2 26 22 - 32 mmol/L   Glucose, Bld 180 (H) 70 - 99 mg/dL    Comment: Glucose reference range applies only to samples taken after fasting for at least 8 hours.   BUN 13 6 - 20 mg/dL   Creatinine, Ser 6.19  0.61 - 1.24 mg/dL   Calcium 9.3 8.9 - 50.9 mg/dL   Total Protein 7.2 6.5 - 8.1 g/dL   Albumin 3.7 3.5 - 5.0 g/dL   AST 28 15 - 41 U/L   ALT 12 0 - 44 U/L   Alkaline Phosphatase 91 38 - 126 U/L   Total Bilirubin 1.9 (H) 0.3 - 1.2 mg/dL   GFR, Estimated >32 >67 mL/min    Comment: (NOTE) Calculated using the CKD-EPI Creatinine Equation (2021)    Anion gap 21 (H) 5 - 15    Comment: ELECTROLYTES REPEATED TO VERIFY Performed at [facility], [address] [place], [place] [address]   CBC with Differential     Status: Abnormal   Collection Time: 10/19/22  9:25 AM  Result Value Ref Range   WBC 24.7 (H) 4.0 - 10.5 K/uL   RBC 5.07 4.22 - 5.81 MIL/uL   Hemoglobin 15.7 13.0 - 17.0 g/dL   HCT 09.9 83.3 - 82.5 %   MCV 94.5 80.0 - 100.0 fL   MCH 31.0 26.0 - 34.0 pg   MCHC 32.8 30.0 - 36.0 g/dL   RDW 05.3 97.6 - 73.4 %   Platelets 299 150 - 400 K/uL   nRBC 0.0 0.0 - 0.2 %   Neutrophils Relative % 94 %   Neutro Abs 23.1 (H) 1.7 - 7.7 K/uL   Lymphocytes Relative 1 %   Lymphs Abs 0.3 (L) 0.7 - 4.0 K/uL   Monocytes Relative 4 %   Monocytes Absolute 1.1 (H) 0.1 - 1.0 K/uL   Eosinophils Relative 0 %   Eosinophils Absolute 0.0 0.0 - 0.5 K/uL   Basophils Relative 0 %   Basophils Absolute 0.1 0.0 - 0.1 K/uL   Immature Granulocytes 1 %   Abs Immature Granulocytes 0.25 (H) 0.00 - 0.07 K/uL    Comment: Performed at [facility], [address] [place], [place] [address]  Troponin I (High Sensitivity)     Status: Abnormal   Collection Time: 10/19/22  9:25 AM  Result Value Ref Range   Troponin I (High Sensitivity) 32 (H) <18 ng/L    Comment: (NOTE) Elevated high sensitivity troponin I (hsTnI) values and significant  changes across serial measurements may suggest ACS but many other  chronic and acute conditions are known to elevate hsTnI results.  Refer to the "Links" section for chest pain algorithms and additional  guidance. Performed at [facility], [address],  [place], [place] [address]   Magnesium     Status: None   Collection Time: 10/19/22  9:25 AM  Result Value Ref Range   Magnesium  1.8 1.7 - 2.4 mg/dL    Comment: Performed at [facility], [address] [place], [place] [address]  Phosphorus     Status: Abnormal   Collection Time: 10/19/22  9:25 AM  Result Value Ref Range   Phosphorus 2.1 (L) 2.5 - 4.6 mg/dL    Comment: Performed at [facility], [address] [place], [place] [address]  Lactic acid, plasma     Status: Abnormal   Collection Time: 10/19/22  9:33 AM  Result Value Ref Range   Lactic Acid, Venous 3.3 (HH) 0.5 - 1.9 mmol/L    Comment: CRITICAL RESULT CALLED TO, READ BACK BY AND VERIFIED WITH M.FRANCIS,RN 1024 10/19/22 CLARK,S Performed at [facility], [address] [place], [place] [address]   Resp panel by RT-PCR (RSV, Flu A&B, Covid) Anterior Nasal Swab     Status: None   Collection Time: 10/19/22 11:27 AM   Specimen: Anterior Nasal Swab  Result Value Ref Range   SARS Coronavirus 2 by RT PCR NEGATIVE NEGATIVE    Comment: (NOTE) SARS-CoV-2 target nucleic acids are NOT DETECTED.  The SARS-CoV-2 RNA is generally detectable in upper respiratory specimens during the acute phase of infection. The lowest concentration of SARS-CoV-2 viral copies this assay can detect is 138 copies/mL. A negative result does not preclude SARS-Cov-2 infection and should not be used as the sole basis for treatment or other patient management decisions. A negative result may occur with  improper specimen collection/handling, submission of specimen other than nasopharyngeal swab, presence of viral mutation(s) within the areas targeted by this assay, and inadequate number of viral copies(<138 copies/mL). A negative result must be combined with clinical observations, patient history, and epidemiological information. The expected result is Negative.  Fact Sheet for Patients:  BloggerCourse.com  Fact  Sheet for Healthcare Providers:  SeriousBroker.it  This test is no t yet approved or cleared by the [place] FDA and  has been authorized for detection and/or diagnosis of SARS-CoV-2 by FDA under an Emergency Use Authorization (EUA). This EUA will remain  in effect (meaning this test can be used) for the duration of the COVID-19 declaration under Section 564(b)(1) of the Act, 21 U.S.C.section 360bbb-3(b)(1), unless the authorization is terminated  or revoked sooner.       Influenza A by PCR NEGATIVE NEGATIVE   Influenza B by PCR NEGATIVE NEGATIVE    Comment: (NOTE) The Xpert Xpress SARS-CoV-2/FLU/RSV plus assay is intended as an aid in the diagnosis of influenza from Nasopharyngeal swab specimens and should not be used as a sole basis for treatment. Nasal washings and aspirates are unacceptable for Xpert Xpress SARS-CoV-2/FLU/RSV testing.  Fact Sheet for Patients: BloggerCourse.com  Fact Sheet for Healthcare Providers: SeriousBroker.it  This test is not yet approved or cleared by the [place] FDA and has been authorized for detection and/or diagnosis of SARS-CoV-2 by FDA under an Emergency Use Authorization (EUA). This EUA will remain in effect (meaning this test can be used) for the duration of the COVID-19 declaration under Section 564(b)(1) of the Act, 21 U.S.C. section 360bbb-3(b)(1), unless the authorization is terminated or revoked.     Resp Syncytial Virus by PCR NEGATIVE NEGATIVE    Comment: (NOTE) Fact Sheet for Patients: BloggerCourse.com  Fact Sheet for Healthcare Providers: SeriousBroker.it  This test is not yet approved or cleared by the [place] FDA and has been authorized for detection and/or diagnosis of SARS-CoV-2 by FDA under an Emergency Use Authorization (EUA). This EUA  will remain in effect (meaning this test can  be used) for the duration of the COVID-19 declaration under Section 564(b)(1) of the Act, 21 U.S.C. section 360bbb-3(b)(1), unless the authorization is terminated or revoked.  Performed at [facility], [address], [place], [place] [address]   Lactic acid, plasma     Status: Abnormal   Collection Time: 10/19/22 12:00 PM  Result Value Ref Range   Lactic Acid, Venous 3.6 (HH) 0.5 - 1.9 mmol/L    Comment: CRITICAL VALUE NOTED. VALUE IS CONSISTENT WITH PREVIOUSLY REPORTED/CALLED VALUE Performed at [facility], [address] [place], [place] [address]   Troponin I (High Sensitivity)     Status: Abnormal   Collection Time: 10/19/22 12:00 PM  Result Value Ref Range   Troponin I (High Sensitivity) 37 (H) <18 ng/L    Comment: (NOTE) Elevated high sensitivity troponin I (hsTnI) values and significant  changes across serial measurements may suggest ACS but many other  chronic and acute conditions are known to elevate hsTnI results.  Refer to the "Links" section for chest pain algorithms and additional  guidance. Performed at [facility], [address] [place], [place] [address]    CT CHEST ABDOMEN PELVIS W CONTRAST  Result Date: 10/19/2022 CLINICAL DATA:  Sepsis.  Mental status change. EXAM: CT CHEST, ABDOMEN, AND PELVIS WITH CONTRAST TECHNIQUE: Multidetector CT imaging of the chest, abdomen and pelvis was performed following the standard protocol during bolus administration of intravenous contrast. RADIATION DOSE REDUCTION: This exam was performed according to the departmental dose-optimization program which includes automated exposure control, adjustment of the mA and/or kV according to patient size and/or use of iterative reconstruction technique. CONTRAST:  45mL OMNIPAQUE IOHEXOL 350 MG/ML SOLN COMPARISON:  CT abdomen pelvis, 09/27/2022. Chest radiograph, 10/19/2022. CTA chest, 02/23/2020. FINDINGS: CT CHEST FINDINGS Cardiovascular: Heart normal in size  configuration. No pericardial effusion. Minimal right coronary artery calcification. Great vessels are normal in caliber. Arch branch vessels are widely patent. Mediastinum/Nodes: Normal thyroid. No neck base, mediastinal or hilar masses. No enlarged lymph nodes. Trachea and esophagus are unremarkable. Lungs/Pleura: Left lower lobe bronchus is occluded, presumably by mucous plugging. Patchy ground-glass and more confluent consolidation noted in the left lower lobe. Mild dependent opacity noted in the right lower lobe consistent with atelectasis. Minimal dependent atelectasis in the left upper lobe lingula and right middle lobe. Remainder of the lungs is clear. No pleural effusion.  No pneumothorax. Musculoskeletal: No fracture.  No bone lesion.  No chest wall mass. CT ABDOMEN PELVIS FINDINGS Hepatobiliary: Liver normal in overall size attenuation. There are several low-attenuation liver lesions, largest arising from the anterior liver just below the dome, 1.5 cm. Others are all subcentimeter. These all suspected to be cysts and are similar to the prior CT. Normal gallbladder. No bile duct dilation. Pancreas: Unremarkable. No pancreatic ductal dilatation or surrounding inflammatory changes. Spleen: Normal in size without focal abnormality. Adrenals/Urinary Tract: Normal adrenal glands. Bilateral renal cortical thinning. Multiple small, 5 mm in last, renal lesions, too small to characterize, likely cysts. No follow-up recommended. No stones. No hydronephrosis. Ureters normal in course and in caliber. Dependent density noted in the bladder consistent with stones. Bladder wall mildly thickened. Small amount of non dependent air. No bladder mass. Foley catheter lies within the bladder, well positioned. Stomach/Bowel: Normal stomach. Small bowel and colon are normal in caliber. No wall thickening. No inflammation. Normal appendix. Vascular/Lymphatic: Mild aortic atherosclerosis. No aneurysm. No enlarged lymph nodes.  Reproductive:  Unremarkable. Other: No ascites. Musculoskeletal: No acute fracture. No bone lesion. 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 No spondylolisthesis. IMPRESSION: 1. Occluded left lower lobe bronchus. Patchy opacities noted in the left lower lobe consistent with pneumonia. 2. Bladder wall thickening. Dependent density consistent with stones in the bladder. Suspect cystitis. No CT evidence of pyelonephritis and no findings of obstructive uropathy. 3. No other acute abnormalities within the chest, abdomen or pelvis. Electronically Signed   By: Amie Portland M.D.   On: 10/19/2022 15:31   CT Maxillofacial W Contrast  Result Date: 10/19/2022 CLINICAL DATA:  Sensory abnormality, trigeminal origin (CN 5) EXAM: CT MAXILLOFACIAL WITH CONTRAST TECHNIQUE: Multidetector CT imaging of the maxillofacial structures was performed with intravenous contrast. Multiplanar CT image reconstructions were also generated. RADIATION DOSE REDUCTION: This exam was performed according to the departmental dose-optimization program which includes automated exposure control, adjustment of the mA and/or kV according to patient size and/or use of iterative reconstruction technique. CONTRAST:  38mL OMNIPAQUE IOHEXOL 350 MG/ML SOLN COMPARISON:  CT head August 19, 2022. FINDINGS: Osseous: No fracture or mandibular dislocation. No destructive process. Orbits: Negative. No traumatic or inflammatory finding. Sinuses: Clear. Soft tissues: Negative. Limited intracranial: No significant or unexpected finding. IMPRESSION: No evidence of acute fracture.  Clear sinuses. Electronically Signed   By: Feliberto Harts M.D.   On: 10/19/2022 15:24   CT Head Wo Contrast  Result Date: 10/19/2022 CLINICAL DATA:  Sepsis, mental status change EXAM: CT HEAD WITHOUT CONTRAST TECHNIQUE: Contiguous axial images were obtained from the base of the skull through the vertex without intravenous contrast. RADIATION DOSE REDUCTION: This exam was performed according to the  departmental dose-optimization program which includes automated exposure control, adjustment of the mA and/or kV according to patient size and/or use of iterative reconstruction technique. COMPARISON:  08/19/2022 FINDINGS: Brain: Stable atrophy pattern. No acute intracranial hemorrhage, mass lesion, new infarction, midline shift, herniation, hydrocephalus, or extra-axial fluid collection. No focal mass effect or edema. Cisterns are patent. No gross cerebellar abnormality. Vascular: No hyperdense vessel or unexpected calcification. Skull: Normal. Negative for fracture or focal lesion. Sinuses/Orbits: No acute finding. Other: None. IMPRESSION: 1. Stable atrophy pattern. 2. No acute intracranial abnormality by noncontrast CT. No interval change. Electronically Signed   By: Judie Petit.  Shick M.D.   On: 10/19/2022 15:24   DG Chest Port 1 View  Result Date: 10/19/2022 CLINICAL DATA:  Sepsis.  Asthma EXAM: PORTABLE CHEST 1 VIEW COMPARISON:  08/19/2022 FINDINGS: Normal heart size. Low lung volumes. Minimal linear scarring or atelectasis bilaterally. Lungs are otherwise clear. No pleural effusion or pneumothorax. IMPRESSION: Low lung volumes with minimal linear scarring or atelectasis bilaterally. Electronically Signed   By: Duanne Guess D.O.   On: 10/19/2022 08:21    Pending Labs Unresulted Labs (From admission, onward)     Start     Ordered   10/19/22 2000  Basic metabolic panel  Once,   R        10/19/22 1725   10/19/22 1725  Lactic acid, plasma  Once,   R        10/19/22 1724   10/19/22 1416  Urine Culture  Once,   URGENT       Question:  Indication  Answer:  Sepsis   10/19/22 1416   10/19/22 0755  Blood Culture (routine x 2)  (Undifferentiated presentation (screening labs and basic nursing orders))  BLOOD CULTURE X 2,   STAT      10/19/22 0756   10/19/22 0755  Urinalysis, Routine  w reflex microscopic  (Undifferentiated presentation (screening labs and basic nursing orders))  ONCE - URGENT,   URGENT         10/19/22 0756            Vitals/Pain Today's Vitals   10/19/22 1315 10/19/22 1322 10/19/22 1330 10/19/22 1530  BP: 102/70  118/76 107/75  Pulse: (!) 101  99 99  Resp: 13  14 (!) 21  Temp:  98.5 F (36.9 C)    TempSrc:  Oral    SpO2: 97%  97% 96%  PainSc:        Isolation Precautions No active isolations  Medications Medications  potassium chloride 10 mEq in 100 mL IVPB (10 mEq Intravenous New Bag/Given 10/19/22 1608)  lactated ringers infusion ( Intravenous New Bag/Given 10/19/22 1412)  metroNIDAZOLE (FLAGYL) IVPB 500 mg (has no administration in time range)  vancomycin (VANCOREADY) IVPB 2000 mg/400 mL (has no administration in time range)  HYDROmorphone (DILAUDID) injection 0.5 mg (has no administration in time range)  enoxaparin (LOVENOX) injection 40 mg (has no administration in time range)  lactated ringers bolus 1,000 mL (0 mLs Intravenous Stopped 10/19/22 0941)  lactated ringers bolus 1,000 mL (0 mLs Intravenous Stopped 10/19/22 1701)  HYDROmorphone (DILAUDID) injection 0.5 mg (0.5 mg Intravenous Given 10/19/22 1138)  ceFEPIme (MAXIPIME) 2 g in sodium chloride 0.9 % 100 mL IVPB (0 g Intravenous Stopped 10/19/22 1701)  lactated ringers bolus 1,000 mL (0 mLs Intravenous Stopped 10/19/22 1744)  iohexol (OMNIPAQUE) 350 MG/ML injection 75 mL (75 mLs Intravenous Contrast Given 10/19/22 1513)  HYDROmorphone (DILAUDID) injection 0.5 mg (0.5 mg Intravenous Given 10/19/22 1628)    Mobility non-ambulatory High fall risk   Focused Assessments    R Recommendations: See Admitting Provider Note  Report given to:   Additional Notes: patient alert and oriented, muscular dystrophy being admitted for pneumonai. Chronic foley with pneumonia. Low potassium has been replaced with K runs

## 2022-10-19 NOTE — Hospital Course (Addendum)
Hx of nerve block Did better over christmas Thursday pain  No fevers chills  No nausea or vomiting  Unable to eat or drink for last several days from pain in jaw. Lost weight Sacral ulcers  Wheezing Sat up in wheelchair to eat  No coughing  Diarrhea chronically but has been worse recently  Foley changed 09/27/22  Blood in urine  Can feed himself with left hand, uses cell phone, can talk  MH:  Escitalopram Modafinil Nuedexta (dextro-quinidine)- hasn't taken in 3 days    Gabapentin Baclofen  Trigeminal neuralgia  CAP  1/9: Pt continues to endorse not normal eating habits. Wife has been taking care of him since 2007 and never had a bed sore. Swallowing team in the room as well. Wife in room. Speech team wants on opportunity to watch him swallow a little more, wants to get pain under control first. Dr. Kerman Passey is neurologist for trigeminal neuralgia. July 3rd trigeminal. neuralgia started. 4 dentists, two pain places, two different neurologists. Per chart review, was previously taking percocet (09/22/2022). Before July he was able to eat normally. Wheelchair bound at that time as well. Most recently took oxycodonefor pain, but nothing for the last 4 days. Since oxycarbazepine was removed. Involving neurology. Have discussed peg tube before, but does not want one.  Says pain is at a 6 right now. Dilaudid brought pain to a 2. It took two hours for dilaudid to work. Methocarbamol and baclofen at home. At home takes oxy, tylenol, then advil. Has been on tegretol or carbamazepine   10/23/22:  Pt states he is feeling terrific, ate graham crackers and one ding dong. Pain is 0/10. Still having loose bowel movements, but only one. Denies belly pain. Swallowing is going well. Feels back to normal self.   NO SOB, no chest pain.

## 2022-10-19 NOTE — ED Notes (Signed)
RN removed foley catheter that pt came to ED with. Per MD order, RN inserted new foley catheter. RN did get urine return and there was sediment and some bloody in urine.

## 2022-10-19 NOTE — ED Triage Notes (Signed)
Pt [facility] by [facility] coming from [facility]. Pt has hx of MS and muscular dystrophy. 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 Pt was prescribed oxy for pain management but cannot take it orally bc of the pain. For the past two weeks pt has not been eating much, he has also not had any pain medication since Wednesday. Pt has decreased urine output. Wife wanted pt evaluated. Pt also has pressure ulcers on his back and thighs.   LVS   116/78  HR 96 95% RA  97.9

## 2022-10-19 NOTE — ED Notes (Signed)
Multiple attempts to draw blood cultures unsuccessful, MD notified

## 2022-10-20 DIAGNOSIS — G500 Trigeminal neuralgia: Secondary | ICD-10-CM

## 2022-10-20 DIAGNOSIS — N390 Urinary tract infection, site not specified: Secondary | ICD-10-CM | POA: Diagnosis not present

## 2022-10-20 DIAGNOSIS — T83511S Infection and inflammatory reaction due to indwelling urethral catheter, sequela: Secondary | ICD-10-CM

## 2022-10-20 LAB — BASIC METABOLIC PANEL
Anion gap: 11 (ref 5–15)
Anion gap: 11 (ref 5–15)
BUN: 10 mg/dL (ref 6–20)
BUN: 10 mg/dL (ref 6–20)
CO2: 29 mmol/L (ref 22–32)
CO2: 27 mmol/L (ref 22–32)
Calcium: 8.2 mg/dL — ABNORMAL LOW (ref 8.9–10.3)
Calcium: 8.1 mg/dL — ABNORMAL LOW (ref 8.9–10.3)
Chloride: 99 mmol/L (ref 98–111)
Chloride: 99 mmol/L (ref 98–111)
Creatinine, Ser: 0.83 mg/dL (ref 0.61–1.24)
Creatinine, Ser: 0.72 mg/dL (ref 0.61–1.24)
GFR, Estimated: >60 mL/min (ref >60)
GFR, Estimated: >60 mL/min (ref >60)
Glucose, Bld: 92 mg/dL (ref 70–99)
Glucose, Bld: 86 mg/dL (ref 70–99)
Potassium: 2.9 mmol/L — ABNORMAL LOW (ref 3.5–5.1)
Potassium: 3.3 mmol/L — ABNORMAL LOW (ref 3.5–5.1)
Sodium: 139 mmol/L (ref 135–145)
Sodium: 137 mmol/L (ref 135–145)

## 2022-10-20 LAB — CBC
HCT: 36.6 % — ABNORMAL LOW (ref 39.0–52.0)
Hemoglobin: 12 g/dL — ABNORMAL LOW (ref 13.0–17.0)
MCH: 30.8 pg (ref 26.0–34.0)
MCHC: 32.8 g/dL (ref 30.0–36.0)
MCV: 94.1 fL (ref 80.0–100.0)
Platelets: 186 10*3/uL (ref 150–400)
RBC: 3.89 MIL/uL — ABNORMAL LOW (ref 4.22–5.81)
RDW: 15.5 % (ref 11.5–15.5)
WBC: 23.1 10*3/uL — ABNORMAL HIGH (ref 4.0–10.5)
nRBC: 0 % (ref 0.0–0.2)

## 2022-10-20 LAB — MAGNESIUM
Magnesium: 1.5 mg/dL — ABNORMAL LOW (ref 1.7–2.4)
Magnesium: 1.9 mg/dL (ref 1.7–2.4)

## 2022-10-20 LAB — LACTIC ACID, PLASMA: Lactic Acid, Venous: 1.5 mmol/L (ref 0.5–1.9)

## 2022-10-20 LAB — PROCALCITONIN: Procalcitonin: 0.18 ng/mL

## 2022-10-20 LAB — TROPONIN I (HIGH SENSITIVITY): Troponin I (High Sensitivity): 30 ng/L — ABNORMAL HIGH (ref <18)

## 2022-10-20 MED ORDER — POTASSIUM PHOSPHATES 15 MMOLE/5ML IV SOLN
15.0000 mmol | Freq: Once | INTRAVENOUS | Status: AC
  Administered 2022-10-20: 15 mmol via INTRAVENOUS
  Filled 2022-10-20: qty 5

## 2022-10-20 MED ORDER — POTASSIUM CHLORIDE 10 MEQ/100ML IV SOLN
10.0000 meq | INTRAVENOUS | Status: AC
  Administered 2022-10-20 (×4): 10 meq via INTRAVENOUS
  Filled 2022-10-20 (×4): qty 100

## 2022-10-20 MED ORDER — LIDOCAINE BOLUS VIA INFUSION: 5.0000 mg/kg | Freq: Once | INTRAVENOUS | Status: DC

## 2022-10-20 MED ORDER — POTASSIUM CHLORIDE 10 MEQ/100ML IV SOLN
10.0000 meq | INTRAVENOUS | Status: AC
  Administered 2022-10-20 – 2022-10-21 (×4): 10 meq via INTRAVENOUS
  Filled 2022-10-20 (×4): qty 100

## 2022-10-20 MED ORDER — POTASSIUM CHLORIDE 10 MEQ/100ML IV SOLN: 10.0000 meq | INTRAVENOUS | Status: DC

## 2022-10-20 MED ORDER — LIDOCAINE 4 % EX CREA
TOPICAL_CREAM | Freq: Once | CUTANEOUS | Status: AC
  Filled 2022-10-20: qty 5

## 2022-10-20 MED ORDER — HYDROMORPHONE HCL 1 MG/ML IJ SOLN
0.5000 mg | Freq: Four times a day (QID) | INTRAMUSCULAR | Status: DC
  Administered 2022-10-20 – 2022-10-21 (×4): 0.5 mg via INTRAVENOUS
  Filled 2022-10-20 (×3): qty 0.5
  Filled 2022-10-20: qty 1

## 2022-10-20 MED ORDER — MAGNESIUM SULFATE 2 GM/50ML IV SOLN
2.0000 g | Freq: Once | INTRAVENOUS | Status: AC
  Administered 2022-10-20: 2 g via INTRAVENOUS
  Filled 2022-10-20: qty 50

## 2022-10-20 MED ORDER — ACETAMINOPHEN 10 MG/ML IV SOLN
1000.0000 mg | Freq: Four times a day (QID) | INTRAVENOUS | Status: AC
  Administered 2022-10-20 – 2022-10-21 (×4): 1000 mg via INTRAVENOUS
  Filled 2022-10-20 (×4): qty 100

## 2022-10-20 MED ORDER — PREGABALIN 75 MG PO CAPS
75.0000 mg | ORAL_CAPSULE | Freq: Three times a day (TID) | ORAL | Status: DC
  Administered 2022-10-21 – 2022-10-24 (×11): 75 mg via ORAL
  Filled 2022-10-20 (×12): qty 1

## 2022-10-20 NOTE — TOC CM/SW Note (Addendum)
  Transition of Care [facility]) Screening Note   Patient Details  Name: [name] Date of Birth: 1961/05/29     Transition of Care Department [facility]) will continue to monitor patient advancement through interdisciplinary progression rounds. If new patient transition needs arise, please place a TOC consult.  Patient from home with wife. History MS, quadriplegia, and chronic foley.   Patient active with [facility] and [facility] . [facility] will need new orders to continue care   [facility]

## 2022-10-20 NOTE — Consult Note (Signed)
NEUROLOGY CONSULTATION NOTE   Date of service: October 20, 2022 Patient Name: [name] MRN:  [id number] DOB:  12/15/1960 Reason for consult: "trigeminal neuralgia" Requesting Provider: Dickie La, MD  History of Present Illness  [name] is a 62 y.o. male with PMH significant for MS c/b spastic quadriparesis and neurogenic bladder, chronic foley, wheelchair bound at home, classic migraines, right-side Trigeminal Neuralgia, who presents to the ED with severe right-sided facial pain, refractory to TN and reduced intake/concern for malnutrition.   History obtained from patient, wife at bedside, and chart.   Patient has reportedly lost 40-50 lbs since July d/t reduced intake d/t facial pain. He has had multiple ED visits and hospitalizations for the same c/o severe right facial pain. Labs in the past have demonstrated dehydration, AKI, and hypokalemia. On this admission, labs are consistent with this, in addition to hypomagnesia, increased lactic acid and leukocytosis. Urine culture positive for GNR and CT chest questionable for obstructive pneumonia.   [name] sees Dr. Epimenio Foot at [facility] for his outpatient mgmt of both his MS and TN. Medications he has tried for his TN include: Oxcarbazapine, Gabapentin, Baclofen, Lamotrigine. Wife stated that none of these have offered relief, and that the oxcarbazapine caused his left arm to be extremely weak. He also underwent a nerve block in November and an ablation on 12/12. The wife states the block offered about 4 hours of relief and the ablation offered about 2-3 weeks of relief. The pain has increased again over the past few days, increasing to this level late last week.   The pain is described as always on the right side, upper cheek area. Patient states that the flares last for hours. He does indicate that the pain is relieved currently with the IV dilaudid.    ROS   Constitutional Denies fever and chills. + for recent weight  loss of 40-50 lbs over the past 6 months.   HEENT Denies changes in vision and hearing.   Respiratory Denies SOB and cough.   CV Denies palpitations and CP   GI Denies abdominal pain, diarrhea. + for recent episodes of nausea and vomiting.   GU Denies dysuria and urinary frequency. (Chronic catheter)  MSK Denies myalgia and joint pain.   Skin Denies rash and pruritus. + for pressure wounds  Neurological Denies headache and syncope.   Psychiatric Denies recent changes in mood. Denies anxiety and depression.    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   Trigeminal neuralgia of right side of face    Past Surgical History:  Procedure Laterality Date   eye surgeries     x 2; bilateral 74 and 76   Family History  Problem Relation Age of Onset   Cancer Father    Multiple sclerosis Sister    Seizures Maternal Uncle    Parkinsonism Maternal Uncle    Multiple sclerosis Sister    Multiple sclerosis Paternal Uncle    Multiple sclerosis Other    Lung cancer Other        parent   Uterine cancer Other        other   Social History   Socioeconomic History   Marital  status: Married    Spouse name: joy   Number of children: 2   Years of education: college   Highest education level: Not on file  Occupational History   Occupation: disabled  Tobacco Use   Smoking status: Former   Smokeless tobacco: Never  Scientific laboratory technician Use: Not on file  Substance and Sexual Activity   Alcohol use: No    Comment: rare    Drug use: No   Sexual activity: Not on file  Other Topics Concern   Not on file  Social History Narrative   Lives at home, married   Patient is right handed.   Patient drinks 1 cup caffeine daily.   Social Determinants of Health    Financial Resource Strain: Not on file  Food Insecurity: Not on file  Transportation Needs: No Transportation Needs (07/04/2020)   PRAPARE - Hydrologist (Medical): No    Lack of Transportation (Non-Medical): No  Physical Activity: Not on file  Stress: Not on file  Social Connections: Not on file   Allergies  Allergen Reactions   Gadopiclenol Nausea And Vomiting    Medications   Medications Prior to Admission  Medication Sig Dispense Refill Last Dose   acetaminophen (TYLENOL) 325 MG tablet Take 2 tablets (650 mg total) by mouth every 6 (six) hours as needed for mild pain, fever or moderate pain (or Fever >/= 101). 30 tablet 0 Unk   albuterol (VENTOLIN HFA) 108 (90 Base) MCG/ACT inhaler Inhale 2 puffs into the lungs every 6 (six) hours as needed. (Patient taking differently: Inhale 1-2 puffs into the lungs every 6 (six) hours as needed for wheezing.) 1 each 3 Past Week   Carboxymethylcellulose Sod PF (THERATEARS) 1 % GEL Place 1 drop into both eyes daily as needed (dry eyes).   Past Week   Dextromethorphan-Quinidine 20-10 MG CAPS Take 1 tablet by mouth 2 (two) times daily. 60 capsule 3 Past Week   escitalopram (LEXAPRO) 10 MG tablet Take 10 mg by mouth daily.   Past Week   ibuprofen (ADVIL) 200 MG tablet Take 600 mg by mouth every 6 (six) hours as needed for headache or moderate pain.   Unk   modafinil (PROVIGIL) 100 MG tablet TAKE 1 TABLET (100 MG TOTAL) BY MOUTH IN THE MORNING. *SENT FOR PA* (Patient taking differently: Take 100 mg by mouth daily.) 90 tablet 1 Past Week   oxyCODONE-acetaminophen (PERCOCET) 5-325 MG tablet Take 1 tablet by mouth every 6 (six) hours as needed for severe pain. 60 tablet 0 Past Week   ciprofloxacin (CIPRO) 500 MG tablet Take 1 tablet (500 mg total) by mouth every 12 (twelve) hours. (Patient not taking: Reported on 10/20/2022) 14 tablet 0 Completed Course   ocrelizumab 600 mg in sodium chloride 0.9 % 500 mL Inject 600 mg into the  vein every 6 (six) months.       ondansetron (ZOFRAN-ODT) 4 MG disintegrating tablet 4mg  ODT q4 hours prn nausea/vomit (Patient not taking: Reported on 08/19/2022) 6 tablet 0 Not Taking   potassium chloride (KLOR-CON M) 10 MEQ tablet Take 1 tablet (10 mEq total) by mouth 2 (two) times daily for 5 days. (Patient not taking: Reported on 10/20/2022) 10 tablet 0 Completed Course   Potassium Chloride 10 MEQ PACK Take 10 mEq by mouth daily. (Patient not taking: Reported on 10/20/2022) 7 each 0 Completed Course     Vitals   Vitals:   10/19/22 1906 10/20/22 0019 10/20/22 0447 10/20/22  0908  BP:  101/65 112/75 112/74  Pulse:  (!) 106 (!) 101 92  Resp:  20 16 18   Temp:  98.6 F (37 C) 97.6 F (36.4 C) 99 F (37.2 C)  TempSrc:  Oral  Oral  SpO2:  94% (!) 89% 99%  Weight: 95 kg        Body mass index is 30.05 kg/m.  Physical Exam   General:ill-appearing, malnourished male lying in bed; in no acute distress.  CV: No JVD. No peripheral edema.  Pulmonary: Symmetric Chest rise. Normal respiratory effort.  Abdomen: Soft to touch, non-tender.  Ext: No cyanosis, or deformity. BLE pedal edema.  Skin: No rash. Normal palpation of skin.  Pressure ulcers/wounds present Musculoskeletal: Normal digits and nails by inspection. No clubbing.   Neurologic Examination  Mental status/Cognition: Alert. Follows commands.   Speech/language:  Answers questions predominantly by nodding due to pain with talking.   Cranial nerves:  PERRL, EOMI, Multi-directional nystagmus (left & right) Reduced sensation right-side of face. (States this has present since his ablation) Weakened facial movements.  No asymmetry. Hearing intact to voice.   Motor:  Profound generalized weakness.  Able to squeeze with both hands, 3/5 in LUE, 0/5 in proximal RUE  Unable to left either leg off bed or bend knees, Flicker of movement on the left toes.  Big toe constantly in the babinski reflex position.   Sensation: Decreased  sensation RUE. Decreased sensation BLE.    Coordination/Complex Motor:  - Finger to Nose: unable to perform d/t weakness - Gait: deferred  Labs   CBC:  Recent Labs  Lab 10/19/22 0925 10/20/22 0252  WBC 24.7* 23.1*  NEUTROABS 23.1*  --   HGB 15.7 12.0*  HCT 47.9 36.6*  MCV 94.5 94.1  PLT 299 186    Basic Metabolic Panel:  Lab Results  Component Value Date   NA 139 10/20/2022   K 2.9 (L) 10/20/2022   CO2 29 10/20/2022   GLUCOSE 92 10/20/2022   BUN 10 10/20/2022   CREATININE 0.83 10/20/2022   CALCIUM 8.2 (L) 10/20/2022   GFRNONAA >60 10/20/2022   GFRAA 84 07/29/2020   Lipid Panel:  Lab Results  Component Value Date   LDLCALC 163 (H) 08/14/2015   HgbA1c:  Lab Results  Component Value Date   HGBA1C 5.4 08/14/2015   Urine Drug Screen: No results found for: "LABOPIA", "COCAINSCRNUR", "LABBENZ", "AMPHETMU", "THCU", "LABBARB"  Alcohol Level No results found for: "ETH"  CT Head without contrast (Personally reviewed): Stable atrophy pattern No acute intracranial abnormality  CT maxillofacial w contrast (Personally reviewed): Negative acute Clear sinuses  CT chest abdomen pelvis (Personally reviewed): Occluded left lower lobe bronchus. Patchy opacities noted in the left lower lobe consistent with pneumonia. Bladder wall thickening. Dependent density consistent with stones in the bladder. Suspect cystitis. No CT evidence of pyelonephritis and no findings of obstructive uropathy. No other acute abnormalities within the chest, abdomen or pelvis.  Impression   DORWIN FITZHENRY is a 62 y.o. male with PMH significant for MS c/b spastic quadriparesis and neurogenic bladder, chronic foley, classic migraines, right-side Trigeminal Neuralgia, who presents to the ED with severe right-sided facial pain, refractory to TN and reduced intake/concern for malnutrition.   Acute Exacerbation of Trigeminal Neuralgia complicated by malnutrition, dehydration, UTI and suspected  pneumonia.   Recommendations   - IV Lidocaine 5 mg/kg infused over 1 hour to acutely treat the TN pain - Correct electrolyte imbalances, specifically magnesium - Wean opioids if possible  ______________________________________________________________________  Pt seen by Neuro NP/APP and later by MD. Note/plan to be edited by MD as needed.    Lynnae January, DNP, AGACNP-BC [facility] Please use AMION for pager and EPIC for messaging  I have seen the patient and reviewed the above note.  He has some characteristics that are atypical for trigeminal neuralgia, particularly the duration of the attacks when he gets them, and the persistence.  The fact that he did get such significant improvement following his ablation (though temporary) I do think is supportive of trigeminal neuralgia.  In the acute phase, treatment options are relatively limited, I do think that IV lidocaine has some good data supporting it and I had good experience with it in the past.  Over the long-term, we can consider either Lyrica or lamotrigine, unfortunate lamotrigine takes quite a while to increase to therapeutic dose therefore Lyrica may be preferred.  I would start with 75 mg 3 times daily with consideration of rapid titration if he tolerates it.   Ritta Slot, MD [facility] [phone]  If 7pm- 7am, please page neurology on call as listed in AMION.

## 2022-10-20 NOTE — Evaluation (Signed)
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 Per H&P note pt has been choking with swallowing and mainly trying pureed foods. CT concerning for PNA. Per MD H&P, does not seem clinically consistent with PNA. Most recent swallow eval in September 2023 with functional appearing swallow but limited by TN pain. MBS in 08/03/14 revealed functional swallow with mild delays in mastication, good pharyngeal clearance and no aspiration; however, at the time pt described fatigue impacting function during meals. PMH: MS c/b spastic quadriplegia and chronic foley catheter use, depression    Assessment / Plan / Recommendation  Clinical Impression  Pt has a history of mild oropharyngeal dysphagia secondary to his MS, although during previous swallow evals he has been able to compensate for these deficits with Mod I. Most of the information today is obtained through his wife, as Mr. Frommelt is in too much pain. She says that he has been swallowing at his baseline whenever his pain is at a more manageable level, such as Wednesday when he had chopped chicken. Unfortuntely, she thinks this may have contributed to his current flare up of TN pain. She says  that swallowing anything (any consistency, temperature, flavor, etc.) becomes painful. Note that pt has had multiple readmissions for this since initial onset 04/13/22. Discussed trying to priotize abiltiy to start swallowing medications in order to try to manage his symptoms, but she says they have not necessarily found a medication that helps to relieve the pain (without negative side effects). She voiced her concerns about future readmissions every time he has a flare up of his TN pain. She asked about a feeding tube but acknowledges that Mr. Longmore has not been interested in one in the past. He did indicate today that he would be interested in a temporary Cortrak (perhaps inserted on his L side given his TN is on his R side). Medical team arrived and discussed their plan to work on pain management. Suspect that until his pain levels are better managed, he is likely going to continue to avoid POs, but that if his pain is better managed, he should be able to swallow. He might prefer to stick to purees/liquids at first - generally anything that might not pose as much risk for another exacerbation. Recommend ongoing discussion with pt and wife about overall GOC in the setting of chronic issues, severity of pain (which has been very difficult to manage), and likely some challenging decisions to make. SLP Visit Diagnosis: Dysphagia, unspecified (R13.10)    Aspiration Risk  Mild aspiration risk    Diet Recommendation Other (Comment) (pt should be allowed to sample purees and thin liquids  if his pain levels allow)   Medication Administration: Via alternative means    Other  Recommendations Recommended Consults: Other (Comment) (consider neurology and palliative care) Oral Care Recommendations: Oral care QID    Recommendations for follow up therapy are one component of a multi-disciplinary discharge planning process, led by the attending physician.  Recommendations may be updated based on patient status,  additional functional criteria and insurance authorization.  Follow up Recommendations No SLP follow up      Assistance Recommended at Discharge    Functional Status Assessment Patient has had a recent decline in their functional status and demonstrates the ability to make significant improvements in function in a reasonable and predictable amount of time.  Frequency and Duration min 2x/week  2 weeks       Prognosis Prognosis for Safe Diet Advancement: Good Barriers to Reach Goals: Other (Comment) (management of pain)      Swallow Study   General HPI: Pt is a 62 yo male presenting with poor PO intake secondary to trigeminal neuralgia. Per H&P note pt has been choking with swallowing and mainly trying pureed foods. CT concerning for PNA. Per MD H&P, does not seem clinically consistent with PNA. Most recent swallow eval in September 2023 with functional appearing swallow but limited by TN pain. MBS in 08/03/14 revealed functional swallow with mild delays in mastication, good pharyngeal clearance and no aspiration; however, at the time pt described fatigue impacting function during meals. PMH: MS c/b spastic quadriplegia and chronic foley catheter use, depression Type of Study: Bedside Swallow Evaluation Previous Swallow Assessment: see HPI Diet Prior to this Study: NPO Temperature Spikes Noted: No Respiratory Status: Room air History of Recent Intubation: No Behavior/Cognition: Alert Oral Cavity Assessment: Other (comment) (did not assess due to pain) Oral Care Completed by SLP: No Oral Cavity - Dentition: Other (Comment) (not observed due to pain) Vision:  (kept eyes mostly closed) Self-Feeding Abilities: Refused PO Patient Positioning: Upright in bed Baseline Vocal Quality: Low vocal intensity Volitional Cough: Other (Comment) (not observed due to pain) Volitional Swallow:  (not observed due to pain)    Oral/Motor/Sensory Function Overall Oral Motor/Sensory Function: Other  (comment) (not observed due to pain)   Ice Chips Ice chips: Not tested   Thin Liquid Thin Liquid: Not tested    Nectar Thick Nectar Thick Liquid: Not tested   Honey Thick Honey Thick Liquid: Not tested   Puree Puree: Not tested   Solid     Solid: Not tested      Mahala Menghini., M.A. CCC-SLP Acute Rehabilitation Services Office [phone]  Secure chat preferred  10/20/2022,10:35 AM

## 2022-10-20 NOTE — Progress Notes (Addendum)
Subjective: Pt continues to endorse not normal eating habits. Wife has been taking care of him since 2007 and never had a bed sore. Swallowing team in the room as well. Wife in room. Speech team wants on opportunity to watch him swallow a little more, wants to get pain under control first. Dr. Kerman Passey is neurologist for trigeminal neuralgia. July 3rd trigeminal neuralgia started. Have seen 4 dentists, two pain places, two different neurologists. Per chart review, was previously taking percocet (09/22/2022). Before July he was able to eat normally. Wheelchair bound at that time as well. Most recently took oxycodone for pain, but nothing for the last 4 days.Have discussed peg tube before, but does not want one.Says pain is at a 6 right now. Dilaudid brought pain to a 2. It took two hours for dilaudid to work. Methocarbamol and baclofen at home. At home takes oxy, tylenol, then advil. Has been on tegretol or carbamazepine. NO SOB, no chest pain. Pain in shoulder.   Objective:  Vital signs in last 24 hours: Vitals:   10/19/22 1906 10/20/22 0019 10/20/22 0447 10/20/22 0908  BP:  101/65 112/75 112/74  Pulse:  (!) 106 (!) 101 92  Resp:  20 16 18   Temp:  98.6 F (37 C) 97.6 F (36.4 C) 99 F (37.2 C)  TempSrc:  Oral  Oral  SpO2:  94% (!) 89% 99%  Weight: 95 kg      Gen: Chronically ill appearing, acute distress, non-toxic HEENT: Poor dentition, dry mucous membranes CV:RRR, normal s1/s2, 2+ bilateral radial pulses Abd: soft,NT,ND, normal active bowel sounds Pulm:normal wob, LCTAB at the anterior lung fields Extremities: cool, dry, bilateral DP not palpable, R LE 1+pitting edema to the mid shin  Assessment/Plan:  Active Problems:   Urinary tract infection  [name] is a 62 y/o male with a pmh of MS c/b quadriplegia and chronic foley catheter use, trigeminal neuralgia, depression who presents for poor PO intake since Thursday secondary to trigeminal neuralgia and found to have CAP and  cystitis.  CAP CXR negative for acute cardiopulmonary process.CT chest abdomen pelvis with occluded LLL bronchus with findings consistent with pneumonia. Spoke with the radiologist who says there is no volume loss to suggest atelectasis. Says that there is no surrounding mass and all of the bronchial obstruction is endobronchial. Cannot definitively characterize but statistically is most likely mucous plugging. Continues to be afebrile,satting well on RA with normal WOB. Reports some cough for the past few days but not observed in the room. Lactate improving from 3.3 to 2.3 with IVF and treatment of infection. Procal today 0.18, although has gotten 2 days of antibiotics during this time. WBC 24.7 to 23.1 likely all dilutional.Got IV vanc, cefepime and flagyl in the ED yesterday. Continuing cefepime and azithro for CAP coverage.Bcx no growth at <12 hours. -daily cbc -F/u lactate -discontinue IV metronidazole -discontinue IV vanc -IV cefepime, IV azithromycin, per pharmacy dosing   CAUTI Indwelling foley catheter His UA Demonstrated >50RBC, 6-10 WBC, few bacteria and no squamous cells indicating likely good specimen consistent with UTI. CT pelvis with bladder wall thickening with dependent stones consistent with cystitis. Urine culture growing >100K CFU gram negative rods. Grew >100K pseudomonas 04/2022 sensitive to cefepime. Most recently <10K CFU on urine culture 08/2022. Foley changed once monthley, changed in the ED on admission.Hasn't take Methenamine in a month which usually helps with UTI prevention. -IV cefepime -per pharmacy dosing -F/u urine culture for sensitivities   Trigeminal Neuralgia Diagnosed in July 2023,  no evidence of etiology on MRI imaging.Home meds also include oxcarbazepine for trigeminal neuralgia and gabapentin for dysethesias, although the patient's spouse reports he has not taken these for at least a month.Had nerve block performed in 08/2022 and it appears this has not had  much benefit.Discussed with neuro who is reccomending Lidocaine infusion with cardiac monitoring as a last ditch effort. The 6E unit is available and capable to do this and will place bed request. SLP also following due to pain with swallowing and reported 50lbs weight loss since July. Was unable to evaluate swallowing today due to pain but will follow along. Did discuss briefly with the family the possibility of NG tube and G tube. The patient has told his wife he did not want a G tube. Did not specifically discuss this today but he was open to NG tube. Will consider this after seen by neurology. -neurology consulted -Dilaudid 0.5mg  IV q2hrs prn -Robaxin 500mg  IV q12 hrs -SLP and RD consulted  Hypokalemia Hypomagnesemia Potassium decreased to 2.9 and mag decreased to 1.5.  -F/u PM BMP and mag, replete as needed  Elevated troponin Troponins minimally elevated to 32<37. No ST/T wave changes on EKG on repeat EKG today. Suspect this is in the setting of demand from infection. Will repeat trops this PM to follow until peak. -F/u repeat troponins   Stage 2 bilateral medial gluteal fold wounds Bilateral wounds of the medial gluteal folds noted with surrounding erythema, induration, and serosanguinous appearing drainage. These have been evolving over the past weeks and seem to be in an area where the patient's loose stools are likely causing skin breakdown. -wound consult   MS Spastic quadriplegia Sees Dr. with [facility], last office visit 06/2022. Diagnosed in 2007, questionable symptoms dating back to 2001.No MS exacerbation since 2018-19 on Ocrevus.    History of depression -Holding Lexapro  Prior to Admission Living Arrangement: Anticipated Discharge Location: Barriers to Discharge: Dispo: Anticipated discharge in approximately >2 day(s).   07[birth year]1988, MD 10/20/2022, 2:08 PM Pager: 12/19/2022, MD Internal Medicine Resident, PGY-1 [facility]  Pager: [phone] After 5pm on weekdays and 1pm on weekends: On Call pager [phone]

## 2022-10-20 NOTE — Plan of Care (Signed)

## 2022-10-21 DIAGNOSIS — J189 Pneumonia, unspecified organism: Secondary | ICD-10-CM | POA: Diagnosis not present

## 2022-10-21 DIAGNOSIS — N390 Urinary tract infection, site not specified: Secondary | ICD-10-CM

## 2022-10-21 DIAGNOSIS — G500 Trigeminal neuralgia: Secondary | ICD-10-CM | POA: Diagnosis not present

## 2022-10-21 LAB — CBC
HCT: 32.3 % — ABNORMAL LOW (ref 39.0–52.0)
Hemoglobin: 11 g/dL — ABNORMAL LOW (ref 13.0–17.0)
MCH: 31.5 pg (ref 26.0–34.0)
MCHC: 34.1 g/dL (ref 30.0–36.0)
MCV: 92.6 fL (ref 80.0–100.0)
Platelets: 131 10*3/uL — ABNORMAL LOW (ref 150–400)
RBC: 3.49 MIL/uL — ABNORMAL LOW (ref 4.22–5.81)
RDW: 15.3 % (ref 11.5–15.5)
WBC: 18.3 10*3/uL — ABNORMAL HIGH (ref 4.0–10.5)
nRBC: 0 % (ref 0.0–0.2)

## 2022-10-21 LAB — MAGNESIUM: Magnesium: 2 mg/dL (ref 1.7–2.4)

## 2022-10-21 LAB — POTASSIUM: Potassium: 4 mmol/L (ref 3.5–5.1)

## 2022-10-21 MED ORDER — MEDIHONEY WOUND/BURN DRESSING EX PSTE
1.0000 | PASTE | Freq: Every day | CUTANEOUS | Status: DC
  Administered 2022-10-21 – 2022-10-24 (×4): 1 via TOPICAL
  Filled 2022-10-21: qty 44

## 2022-10-21 MED ORDER — ORAL CARE MOUTH RINSE: 15.0000 mL | OROMUCOSAL | Status: DC | PRN

## 2022-10-21 MED ORDER — LAMOTRIGINE 25 MG PO TABS
25.0000 mg | ORAL_TABLET | Freq: Every day | ORAL | Status: DC
  Administered 2022-10-21 – 2022-10-24 (×4): 25 mg via ORAL
  Filled 2022-10-21 (×5): qty 1

## 2022-10-21 MED ORDER — LAMOTRIGINE 100 MG PO TABS: 50.0000 mg | ORAL_TABLET | Freq: Every day | ORAL | Status: DC

## 2022-10-21 MED ORDER — CHLORHEXIDINE GLUCONATE CLOTH 2 % EX PADS: 6.0000 | MEDICATED_PAD | Freq: Every day | CUTANEOUS | Status: DC

## 2022-10-21 MED ORDER — LAMOTRIGINE 100 MG PO TABS: 100.0000 mg | ORAL_TABLET | Freq: Every day | ORAL | Status: DC

## 2022-10-21 MED ORDER — LIDOCAINE IN D5W 4-5 MG/ML-% IV SOLN
120.0000 mL/h | Freq: Once | Status: AC
  Administered 2022-10-21: 120 mL/h via INTRAVENOUS
  Filled 2022-10-21: qty 120

## 2022-10-21 MED ORDER — HYDROMORPHONE HCL 1 MG/ML IJ SOLN
1.0000 mg | Freq: Three times a day (TID) | INTRAMUSCULAR | Status: DC | PRN
  Administered 2022-10-21: 1 mg via INTRAVENOUS
  Filled 2022-10-21: qty 1

## 2022-10-21 MED ORDER — HYDROMORPHONE HCL 1 MG/ML IJ SOLN
0.5000 mg | Freq: Three times a day (TID) | INTRAMUSCULAR | Status: DC | PRN
  Administered 2022-10-21 – 2022-10-22 (×3): 0.5 mg via INTRAVENOUS
  Filled 2022-10-21 (×3): qty 1

## 2022-10-21 MED ORDER — CHLORHEXIDINE GLUCONATE CLOTH 2 % EX PADS
6.0000 | MEDICATED_PAD | Freq: Every day | CUTANEOUS | Status: DC
  Administered 2022-10-21 – 2022-10-23 (×4): 6 via TOPICAL

## 2022-10-21 NOTE — Progress Notes (Signed)
Speech Language Pathology Treatment: Dysphagia  Patient Details Name: [name] MRN: [id number] DOB: [birth year] Today's Date: 10/21/2022 Time: 6789-3810 SLP Time Calculation (min) (ACUTE ONLY): 23 min  Assessment / Plan / Recommendation Clinical Impression  F/u after yesterday's initial meeting. Mr. Mohler just took a Lyrica prior to arrival; pt and his wife feel that the movement associated with swallowing exacerbates his pain. Current pain is 9/10, and understandably, he declined any further POs.  Mrs. Hilgers reviewed her concerns specific to pain and weight loss. G-tube has been mentioned, and while Mr. Chiquita Loth has declined in the past, he voiced willingness to reconsider, particularly with the understanding that G-tubes are not permanent nor do they prohibit PO intake. I encouraged him to discuss benefits/burdens/candidacy with medical team.   In the mean time, recommend allowing a standing order of regular solids/thin liquids so that pt can order a meal when he prefers.  I doubt the mechanics of his swallowing are problematic; his odynophagia is the prohibitive component.   Continue oral meds as tolerated.  Will f/u briefly. Reached out to Dr. Stann Mainland via Secure Chat re: the above.     HPI HPI: Pt is a 62 yo male presenting with poor PO intake secondary to trigeminal neuralgia. Per H&P note pt has been choking with swallowing and mainly trying pureed foods. CT concerning for PNA. Per MD H&P, does not seem clinically consistent with PNA. Most recent swallow eval in September 2023 with functional appearing swallow but limited by TN pain. MBS in 08/03/14 revealed functional swallow with mild delays in mastication, good pharyngeal clearance and no aspiration; however, at the time pt described fatigue impacting function during meals. PMH: MS c/b spastic quadriplegia and chronic foley catheter use, depression      SLP Plan  Continue with current plan of care      Recommendations for  follow up therapy are one component of a multi-disciplinary discharge planning process, led by the attending physician.  Recommendations may be updated based on patient status, additional functional criteria and insurance authorization.    Recommendations  Diet recommendations: Thin liquids; regular diet Liquids provided via: Cup;Straw Medication Administration: Whole meds with puree                Oral Care Recommendations: Oral care QID Follow Up Recommendations: No SLP follow up Assistance recommended at discharge: Frequent or constant Supervision/Assistance SLP Visit Diagnosis: Dysphagia, unspecified (R13.10) Plan: Continue with current plan of care         Mark Yim L. Tivis Ringer, MA CCC/SLP Clinical Specialist - Acute Care SLP Acute Rehabilitation Services Office number [phone]   [name]  10/21/2022, 2:42 PM

## 2022-10-21 NOTE — Progress Notes (Addendum)
Subjective: Patient evaluated at the bedside with his wife today. Says the pain went to a 0 with the lidocaine infusion. Now up to a 5 with being moved around. Says he is open to trying meds with warm puree and understands starting lyrica within 24 hours is the desired treatment plan. Denies CP,SOB,cough. Denies abdominal pain or bladder pain.  Objective:  Vital signs in last 24 hours: Vitals:   10/21/22 0526 10/21/22 0548 10/21/22 0812 10/21/22 1233  BP: 98/63 100/62 104/69 109/65  Pulse: 77 76 77 73  Resp: 15 14 15 15   Temp:   97.9 F (36.6 C) 98 F (36.7 C)  TempSrc:   Oral Oral  SpO2: 97% 96% 94% 100%  Weight:      Height:      Grossly unchanged since prior days Gen: Chronically ill appearing, no acute distress, non-toxic but uncomfortable appearing, more interactive todya HEENT: Poor dentition, dry mucous membranes CV:RRR, normal s1/s2, 2+ bilateral radial pulses Abd: soft,NT,ND, normal active bowel sounds Pulm:normal wob, LCTAB at the anterior lung fields Extremities: cool, dry, bilateral DP not palpable, R LE 1+pitting edema to the mid shin  Assessment/Plan:  Active Problems:   Urinary tract infection  Mr. Kinnard is a 62 y/o male with a pmh of MS c/b quadriplegia and chronic foley catheter use, trigeminal neuralgia, depression who presents for poor PO intake since Thursday secondary to trigeminal neuralgia and found to have CAP and cystitis.  CAUTI Indwelling foley catheter Grew >100K pseudomonas 04/2022 sensitive to cefepime. Most recently <10K CFU on urine culture 08/2022. Now with >100K CFU pseudomonas and given this new growth since November and his AMS and weakness at presentation do suspect this is true UTI. Susceptibilities pending. Foley changed once monthley, changed in the ED on admission.Hasn't take Methenamine in a month which usually helps with UTI prevention. Afebrile, HDS, mentating better, mor interactive, no pain in the abdomen or bladder. -IV cefepime  -per pharmacy dosing -F/u urine culture for sensitivities  CAP LLL bronchial obstruction Afebrile, HDS, satting well on RA with no cough or subjective SOB. WBC down trending 23.1 to 18.3.Continuing cefepime and azithro for CAP coverage.Bcx no growth at 2 days. Do think that the UTI is the infectious source of his presentation. Will have pulmonology evaluate the patient for the LLL bronchial obstruction. Although  thought to be mucus plugging per radiology, do want to see if there is evidence of endobronchial malignancy and any need for further evaluation with bronchoscopy. -daily cbc -IV cefepime -IV azithromycin   Trigeminal Neuralgia Diagnosed in July 2023, no evidence of etiology on MRI imaging.Home meds also include oxcarbazepine for trigeminal neuralgia and gabapentin for dysethesias, although the patient's spouse reports he has not taken these for at least a month.Had nerve block performed in 08/2022 and it appears this has not had much benefit. Had improvement of pain after lidocaine infusion last night, says pain went to a 0. Per neurology would like to start lyrica within 24 hours of the infusion. Attempting lyrica with warm puree today.  -neurology consulted -Dilaudid 1 mg IV q8hrs prn -Stop Robaxin 500mg  IV q12 hrs -Start lamotrigine 25mg  daily for 14 days -Start lyrica 75 mg TID -Continue Tylenol q6h -SLP and RD consulted  Hypokalemia Hypomagnesemia Potassium to 4.0 and Mg to 2.0. Continue to monitor.   Stage 2 bilateral medial gluteal fold wounds Bilateral wounds of the medial gluteal folds noted with surrounding erythema, induration, and serosanguinous appearing drainage. These have been evolving over the past  weeks and seem to be in an area where the patient's loose stools are likely causing skin breakdown. -wound consult   MS Spastic quadriplegia Sees Dr. Felecia Shelling with [facility], last office visit 06/2022. Diagnosed in 2007, questionable symptoms dating  back to 2001.No MS exacerbation since 2018-19 on Ocrevus.    History of depression -Holding Lexapro  Prior to Admission Living Arrangement: Anticipated Discharge Location: Barriers to Discharge: Dispo: Anticipated discharge in approximately >2 day(s).   Iona Coach, MD 10/21/2022, 2:07 PM Pager: Iona Coach, MD Internal Medicine Resident, PGY-1 [facility]  Pager: [phone] After 5pm on weekdays and 1pm on weekends: On Call pager [phone]

## 2022-10-21 NOTE — Plan of Care (Signed)

## 2022-10-21 NOTE — Consult Note (Signed)
[place] Nurse Consult Note: Reason for Consult: Vertebral column Deep Tissue Injury, R and L ischium Deep Tissue Injury  Wound type: Deep Tissue Injury evolving to Unstageable:  Proximal Vertebral Column  3 cms x 1 cms Distal vertebral column 2 cms x 0.5 cms;  R ischium 1 cm x 1 cm  L ischium 2 cms x 2 cms  Pressure Injury POA: Yes Drainage (amount, consistency, odor) Minimal serosanguinous  Periwound:intact  Dressing procedure/placement/frequency: Cleanse with NS, pat dry, apply Medihoney to gauze and place in wound bed daily to R ischium, L ischium and Vertebral column. Cover with foam dressing.    Low air loss mattress ordered for moisture management and pressure redistribution.    WOC will not follow at this time.  Re-consult if needed.   Thank you ,  Gurshaan Matsuoka MSN, RN-BC, [employer]

## 2022-10-21 NOTE — TOC Initial Note (Signed)
Transition of Care [facility]) - Initial/Assessment Note    Patient Details  Name: [name] MRN: [id number] Date of Birth: 02-05-1961  [facility]) CM/SW Contact:    Levonne Lapping, RN Phone Number: 10/21/2022, 1:01 PM  Clinical Narrative:       CM met with Patient and Wife bedside. CM confirmed Patient is connected with the [facility]. CM also called and LVM for April Alexander at [facility] [phone] informing her of veterans inpatient status here at [facility].and the need to resume services at dc.  Patient's Wife confirmed VA services were in place prior to admission as: [place] with [facility] . Patient sees Dr Ishmael Holter at the [place]. Patient's Wife is a paid caregiver through the [place] and she has access to additional respite help through the [place] as needed. CM offered to reach out to [place] to inquire about a homemaker aide program or any other additional services that may be available for Patient but Wife and Patient declined at this time. Wife states that she will reach out to [place] if she feels she needs anything additional.  Wife seemed most upset about the "bedsores" that have developed on her Husband stating that it has never happened before in all the years she's been his caregiver.  All DME needed is currently in the home to include several ceiling lifts etc.   TOC will continue to follow patient for any additional discharge needs                Expected Discharge Plan: [place] ([place] in [place]) Barriers to Discharge: Continued Medical Work up   Patient Goals and CMS Choice Patient states their goals for this hospitalization and ongoing recovery are:: go home / not have the "bedsores" per Wife CMS Medicare.gov Compare Post Acute Care list provided to:: Other (Comment Required) (N/A already receiving services from [facility]) Choice offered to / list presented to : NA      Expected Discharge Plan and Services In-house Referral:  NA Discharge Planning Services: CM Consult Post Acute Care Choice: NA Living arrangements for the past 2 months: Single Family Home                 DME Arranged: N/A DME Agency: NA       HH Arranged: NA [place] Agency: [place] ([place] already in place)        Prior Living Arrangements/Services Living arrangements for the past 2 months: Single Family Home Lives with:: Spouse Patient language and need for interpreter reviewed:: No        Need for Family Participation in Patient Care: Yes (Comment) Care giver support system in place?: Yes (comment) (Wife) Current home services: Home RN Criminal Activity/Legal Involvement Pertinent to Current Situation/Hospitalization: No - Comment as needed  Activities of Daily Living   ADL Screening (condition at time of admission) Patient's cognitive ability adequate to safely complete daily activities?: Yes Is the patient deaf or have difficulty hearing?: No Does the patient have difficulty seeing, even when wearing glasses/contacts?: No Does the patient have difficulty concentrating, remembering, or making decisions?: Yes Patient able to express need for assistance with ADLs?: Yes Does the patient have difficulty dressing or bathing?: Yes Independently performs ADLs?: No Communication: Independent Is this a change from baseline?: Pre-admission baseline Dressing (OT): Dependent Is this a change from baseline?: Pre-admission baseline Grooming: Dependent Is this a change from baseline?:  Pre-admission baseline Feeding: Dependent Is this a change from baseline?: Pre-admission baseline Bathing: Dependent Is this a change from baseline?: Pre-admission baseline Toileting: Dependent Is this a change from baseline?: Pre-admission baseline In/Out Bed: Dependent Is this a change from baseline?: Pre-admission baseline Walks in Home: Dependent Is this a change from baseline?: Pre-admission baseline Does the patient  have difficulty walking or climbing stairs?: Yes Weakness of Legs: Both Weakness of Arms/Hands: Both  Permission Sought/Granted         Permission granted to share info w AGENCY: [facility]        Emotional Assessment Appearance:: Appears stated age Attitude/Demeanor/Rapport: Gracious Affect (typically observed): Accepting Orientation: : Oriented to Self, Oriented to Place, Oriented to  Time, Oriented to Situation Alcohol / Substance Use: Not Applicable Psych Involvement: No (comment)  Admission diagnosis:  Hypokalemia [E87.6] Trigeminal neuralgia of right side of face [G50.0] Pneumonia of left lower lobe due to infectious organism [J18.9] Sepsis without acute organ dysfunction, due to unspecified organism [facility]) [A41.9] Patient Active Problem List   Diagnosis Date Noted   Trigeminal neuralgia of right side of face 10/19/2022   Multiple sclerosis ([place]) 08/20/2022   Pressure injury of skin 08/20/2022   Acute encephalopathy 08/19/2022   Right trigeminal neuralgia 08/17/2022   Sepsis ([place]) 03/10/2022   Catheter-associated urinary tract infection ([place]) 03/09/2022   Urinary tract infection 10/13/2020   Lactic acidosis 02/23/2020   Sepsis due to Enterococcus ([place]) 02/23/2020   Enterococcus UTI 02/23/2020   Hyperkalemia 02/23/2020   Penile bleeding 02/23/2020   Acute respiratory failure with hypoxemia ([place]) 02/23/2020   Spastic quadriparesis ([place]) 12/27/2019   Restrictive lung disease 09/06/2017   Depression 09/06/2017   MS (multiple sclerosis) ([place]) 08/13/2017   Dyspnea 08/13/2017   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 AT [address] [place] [address] Phone: [phone] Fax: [phone]  [place], [place] [address] [place] [address] Phone: [phone] Fax: [place], [place] [address] Phone: [phone] Fax: [phone]  [place] [address] Phone: [phone] Fax: [phone]  [id number] [place], [place] [address] [place] [address] Phone: [phone] Fax: [place], [place] - [place] [address] [place] [address] Phone: [phone] Fax: [phone]     Social Determinants of Health (SDOH) Social History: [place]: Low Risk  (07/04/2020)  Transportation Needs: No Transportation Needs (07/04/2020)  Depression (PHQ2-9): Low Risk  (07/04/2020)  Tobacco Use: Medium Risk (09/27/2022)   SDOH Interventions:     Readmission Risk Interventions    08/21/2022    1:23 PM  Readmission Risk Prevention Plan  Post Dischage Appt Complete  Medication Screening Complete  Transportation Screening Complete

## 2022-10-21 NOTE — Plan of Care (Signed)
  Problem: Education: Goal: Knowledge of General Education information will improve Description: Including pain rating scale, medication(s)/side effects and non-pharmacologic comfort measures Outcome: Progressing   Problem: Health Behavior/Discharge Planning: Goal: Ability to manage health-related needs will improve Outcome: Progressing   Problem: Clinical Measurements: Goal: Respiratory complications will improve Outcome: Progressing   Problem: Clinical Measurements: Goal: Cardiovascular complication will be avoided Outcome: Progressing   Problem: Coping: Goal: Level of anxiety will decrease Outcome: Progressing   Problem: Pain Managment: Goal: General experience of comfort will improve Outcome: Progressing   Problem: Safety: Goal: Ability to remain free from injury will improve Outcome: Progressing   Problem: Skin Integrity: Goal: Risk for impaired skin integrity will decrease Outcome: Progressing   

## 2022-10-21 NOTE — Progress Notes (Signed)
Neurology Progress Note  Brief HPI: MS c/b spastic quadriparesis and neurogenic bladder, chronic foley, wheelchair bound at home, classic migraines, right-side Trigeminal Neuralgia, who presents to the ED with severe right-sided facial pain, refractory to TN and reduced intake/concern for malnutrition.  IV lidocaine 5 mg/kg infused over 1 hour completed 10/20/2022 and tolerated well.  Subjective: Patient seen in room. He is able to tell me that the lidocaine did help and then the dilaudid this morning brought his pain to a zero. SLP should see him today.   Hestitant about cortrak- requests we discuss with wife  Exam: Vitals:   10/21/22 0526 10/21/22 0548  BP: 98/63 100/62  Pulse: 77 76  Resp: 15 14  Temp:    SpO2: 97% 96%   Gen: In bed, NAD Resp: non-labored breathing, no acute distress Abd: soft, nt  Neuro: Mental Status: Alert, following commands.  Speech is soft, but intact Cranial Nerves: II: Visual Fields are full. PERRL.   III,IV, VI: Multidirectional nystagmus V: Diminished sensation on the right side of face-since ablation VII: Facial movement is symmetric  VIII: Hearing is intact to voice XI: Head is midline XII: Tongue protrudes midline Motor: Profound generalized weakness.  Able to squeeze with both hands, 3/5 in LUE, 0/5 in proximal RUE  Unable to left either leg off bed or bend knees, Flicker of movement on the left toes.  Big toe constantly in the babinski reflex position.  Sensory: Diminished sensation in the right upper extremity and bilateral lower extremities  Pertinent Labs: Mag 2.0 K4.0 WBC 18.3  Imaging Reviewed:   Assessment: 62 y.o. male with PMH significant for MS c/b spastic quadriparesis and neurogenic bladder, chronic foley, classic migraines, right-side Trigeminal Neuralgia, who presents to the ED with severe right-sided facial pain, refractory to TN and reduced intake/concern for malnutrition.    Acute Exacerbation of Trigeminal Neuralgia  complicated by malnutrition, dehydration, UTI and suspected pneumonia.   Recommendations:  Lamotrigine Initial: Weeks 1 and 2: 25 mg once daily; increase as needed based on response and tolerability as follows: Weeks 3 and 4: 50 mg/day in 1 to 2 divided doses based on chosen formulation; Week 5 and beyond: Increase daily dose by 50 mg every 1 to 2 weeks to a maximum of 400 mg/day in 1 to 2 divided doses   Continue Lyrica 75mg  TID  Patient seen and examined by NP/APP with MD. MD to update note as needed.   Janine Ores, DNP, FNP-BC [facility] Pager: [phone]  I have seen the patient reviewed the above note.  He seems to have had a significant benefit from IV lidocaine.  Unfortunately this is not tenable as a long-term solution, but it can be helpful in the acute crisis phase.  I think lamotrigine could have significant benefit, unfortunately it takes quite a while to get to a therapeutic dose and given his severity I would rather start something as well that has faster affect.  Therefore I have recommended both starting both Lyrica at 75 3 times daily as well as lamotrigine on a titrating basis as recommended above.  I think the main issues at the current time are his ability to eat, and if he continues losing weight this could impair his healing for his back wounds.  He states that he does think he could swallow liquid such as nutritional shakes.  No further acute workup from a neurological perspective, treatment from this point forward will be for Lyrica and lamotrigine.  If he tolerates Lyrica  well, this could be uptitrated some as tolerated.  Neurology will be available on an as-needed basis.  Roland Rack, MD [facility] [phone]  If 7pm- 7am, please page neurology on call as listed in [place].

## 2022-10-21 NOTE — Progress Notes (Signed)
(+)   loose stool, Type 7, 3x (see I&0). No abdominal pain. Afebrile. Informed Jodi Mourning, MD. Monitor per MD.  No further order.

## 2022-10-21 NOTE — Consult Note (Signed)
NAME:  [name], MRN:  [id number], DOB:  [birth year], LOS: 2 ADMISSION DATE:  10/19/2022, CONSULTATION DATE:  10/21/22 REFERRING MD:  Dr. 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  Additionally reports some wheezing with vomiting week prior to admission.  Wife also reports patient frequently suffers from dysphagia with choking on oral intake resulting in mostly pured foods.  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 rate 15, height 5\' 9"  (1.753 m), weight 75.4 kg, SpO2 94 %.        Intake/Output Summary (Last 24 hours) at 10/21/2022 1153 Last data filed at 10/21/2022 0900 Gross per 24 hour  Intake 425.35 ml  Output 501 ml  Net -75.65 ml   Filed Weights   10/19/22 1906 10/21/22 0340  Weight: 95 kg 75.4 kg    Examination: General: Acute on chronically ill appearing elderly male sitting up in bed, in NAD HEENT: [place]/AT, MM pink/moist, PERRL,  Neuro: Alert and oriented x3, non-focal  CV: s1s2 regular rate and rhythm, no murmur, rubs,  or gallops,  PULM:  Clear to auscultation, no increased work of breathing, no added breath sounds  GI: soft, bowel sounds active in all 4 quadrants, non-tender, non-distended Extremities: warm/dry, no edema  Skin: no rashes or lesion  Resolved Hospital Problem list     Assessment & Plan:  Community-acquired pneumonia Left lower lobe lung collapse History of mild oropharyngeal dysphagia in the setting of MS -CT chest on arrival with occluded left lower lobe and findings of pneumonia P: Continue empiric cefepime and azithromycin, Can likely de-escalate cefepime soon per primary  Encourage adequate pulmonary hygiene Can consider CoughAssist SLP evals to determine safe eating plan Trend CBC and fever curve Mobilize as able Can consider repeat CT in 3-6 months to reevaluate LLL obstruction    PCCM will be available as need    Best Practice (right click and "Reselect all SmartList Selections" daily)  Per priamry   Labs   CBC: Recent Labs  Lab 10/19/22 0925 10/20/22 0252 10/21/22 0221  WBC 24.7* 23.1* 18.3*  NEUTROABS 23.1*  --   --   HGB 15.7 12.0* 11.0*  HCT 47.9 36.6* 32.3*  MCV 94.5 94.1 92.6  PLT 299 186 131*    Basic Metabolic Panel: Recent Labs  Lab 10/19/22 0925 10/19/22 2001 10/20/22 0251 10/20/22 0252 10/20/22 1513 10/21/22 0221  NA 141 138  --  139 137  --   K 2.6* 2.8*  --  2.9* 3.3* 4.0  CL 94* 97*  --  99 99  --   CO2 26 27  --  29 27  --   GLUCOSE 180* 104*  --  92 86  --   BUN 13 12  --  10 10  --   CREATININE 1.16 0.90  --  0.83 0.72  --   CALCIUM 9.3 8.5*  --  8.2* 8.1*  --   MG 1.8  --  1.5*  --  1.9 2.0  PHOS 2.1*  --   --   --   --   --    GFR: Estimated Creatinine Clearance: 99.4 mL/min (by C-G formula based on SCr of 0.72 mg/dL). Recent Labs  Lab 10/19/22 0925 10/19/22 0933 10/19/22 1200 10/19/22 2001 10/20/22 0251 10/20/22 0252 10/20/22 1513 10/21/22 0221  PROCALCITON  --   --   --   --  0.18  --   --   --   WBC 24.7*  --    --   --   --  23.1*  --  18.3*  LATICACIDVEN  --  3.3* 3.6* 2.3*  --   --  1.5  --     Liver Function Tests: Recent Labs  Lab 10/19/22 0925  AST 28  ALT 12  ALKPHOS 91  BILITOT 1.9*  PROT 7.2  ALBUMIN 3.7   No results for input(s): "LIPASE", "AMYLASE" in the last 168 hours. No results for input(s): "AMMONIA" in the last 168 hours.  ABG    Component Value Date/Time   PHART 7.346 (L) 02/23/2020 0604   PCO2ART 43.9 02/23/2020 0604   PO2ART 52 (L) 02/23/2020 0604   HCO3 24.0 02/23/2020 0604   TCO2 25 02/23/2020 0604   ACIDBASEDEF 2.0 02/23/2020 0604   O2SAT 83.0 02/23/2020 0604     Coagulation Profile: No results for input(s): "INR", "PROTIME" in the last 168 hours.  Cardiac Enzymes: No results for input(s): "CKTOTAL", "CKMB", "CKMBINDEX", "TROPONINI" in the last 168 hours.  HbA1C: Hgb A1c MFr Bld  Date/Time Value Ref Range Status  08/14/2015 12:16 PM 5.4 4.6 - 6.5 % Final    Comment:    Glycemic Control Guidelines for People with Diabetes:Non Diabetic:  <6%Goal of Therapy: <7%Additional Action Suggested:  >8%   08/04/2012 03:02 PM 5.1 4.6 - 6.5 % Final    Comment:    Glycemic Control Guidelines for People with Diabetes:Non Diabetic:  <6%Goal of Therapy: <7%Additional Action Suggested:  >8%     CBG: No results for input(s): "GLUCAP" in the last 168 hours.  Review of Systems:   Please see the history of present illness. 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   Surgical History:   Past Surgical History:  Procedure Laterality Date   eye surgeries     x 2; bilateral 74 and 68     Social History:   reports that he has quit smoking. He has never used smokeless  tobacco. He reports that he does not drink alcohol and does not use drugs.   Family History:  His family history includes Cancer in his father; Lung cancer in an other family member; Multiple sclerosis in his paternal uncle, sister, sister, and another family member; Parkinsonism in his maternal uncle; Seizures in his maternal uncle; Uterine cancer in an other family member.   Allergies Allergies  Allergen Reactions   Gadopiclenol  Nausea And Vomiting     Home Medications  Prior to Admission medications   Medication Sig Start Date End Date Taking? Authorizing Provider  acetaminophen (TYLENOL) 325 MG tablet Take 2 tablets (650 mg total) by mouth every 6 (six) hours as needed for mild pain, fever or moderate pain (or Fever >/= 101). 03/12/22  Yes Little Ishikawa, MD  albuterol (VENTOLIN HFA) 108 (90 Base) MCG/ACT inhaler Inhale 2 puffs into the lungs every 6 (six) hours as needed. Patient taking differently: Inhale 1-2 puffs into the lungs every 6 (six) hours as needed for wheezing. 07/24/21  Yes Kathrynn Ducking, MD  Carboxymethylcellulose Sod PF (THERATEARS) 1 % GEL Place 1 drop into both eyes daily as needed (dry eyes).   Yes [provider]  Dextromethorphan-Quinidine 20-10 MG CAPS Take 1 tablet by mouth 2 (two) times daily. 05/27/17  Yes Kathrynn Ducking, MD  escitalopram (LEXAPRO) 10 MG tablet Take 10 mg by mouth daily.   Yes [provider]  ibuprofen (ADVIL) 200 MG tablet Take 600 mg by mouth every 6 (six) hours as needed for headache or moderate pain.   Yes [provider]  modafinil (PROVIGIL) 100 MG tablet TAKE 1 TABLET (100 MG TOTAL) BY MOUTH IN THE MORNING. *SENT FOR PA* Patient taking differently: Take 100 mg by mouth daily. 11/29/20  Yes Kathrynn Ducking, MD  oxyCODONE-acetaminophen (PERCOCET) 5-325 MG tablet Take 1 tablet by mouth every 6 (six) hours as needed for severe pain. 09/09/22 09/09/23 Yes Sater, Nanine Means, MD  ciprofloxacin (CIPRO) 500 MG  tablet Take 1 tablet (500 mg total) by mouth every 12 (twelve) hours. Patient not taking: Reported on 10/20/2022 09/27/22   Malvin Johns, MD  ocrelizumab 600 mg in sodium chloride 0.9 % 500 mL Inject 600 mg into the vein every 6 (six) months.     [provider]  ondansetron (ZOFRAN-ODT) 4 MG disintegrating tablet 4mg  ODT q4 hours prn nausea/vomit Patient not taking: Reported on 08/19/2022 08/16/22   Elnora Morrison, MD  potassium chloride (KLOR-CON M) 10 MEQ tablet Take 1 tablet (10 mEq total) by mouth 2 (two) times daily for 5 days. Patient not taking: Reported on 10/20/2022 08/21/22 10/20/22  Bonnielee Haff, MD  Potassium Chloride 10 MEQ PACK Take 10 mEq by mouth daily. Patient not taking: Reported on 10/20/2022 08/30/22   Kemper Durie, DO     Signature:  Pj Zehner D. Harris, NP-C [facility] Personal contact information can be found on Amion  If no contact or response made please call [phone] 10/21/2022, 11:55 AM

## 2022-10-22 DIAGNOSIS — L8990 Pressure ulcer of unspecified site, unspecified stage: Secondary | ICD-10-CM | POA: Diagnosis not present

## 2022-10-22 DIAGNOSIS — G500 Trigeminal neuralgia: Secondary | ICD-10-CM | POA: Diagnosis not present

## 2022-10-22 DIAGNOSIS — N3000 Acute cystitis without hematuria: Secondary | ICD-10-CM

## 2022-10-22 DIAGNOSIS — J189 Pneumonia, unspecified organism: Secondary | ICD-10-CM

## 2022-10-22 LAB — BASIC METABOLIC PANEL
Anion gap: 9 (ref 5–15)
Anion gap: 14 (ref 5–15)
BUN: 7 mg/dL (ref 6–20)
BUN: 9 mg/dL (ref 6–20)
CO2: 27 mmol/L (ref 22–32)
CO2: 21 mmol/L — ABNORMAL LOW (ref 22–32)
Calcium: 7.6 mg/dL — ABNORMAL LOW (ref 8.9–10.3)
Calcium: 7.5 mg/dL — ABNORMAL LOW (ref 8.9–10.3)
Chloride: 102 mmol/L (ref 98–111)
Chloride: 103 mmol/L (ref 98–111)
Creatinine, Ser: 0.65 mg/dL (ref 0.61–1.24)
Creatinine, Ser: 0.72 mg/dL (ref 0.61–1.24)
GFR, Estimated: >60 mL/min (ref >60)
GFR, Estimated: >60 mL/min (ref >60)
Glucose, Bld: 71 mg/dL (ref 70–99)
Glucose, Bld: 77 mg/dL (ref 70–99)
Potassium: 2.8 mmol/L — ABNORMAL LOW (ref 3.5–5.1)
Potassium: 4.9 mmol/L (ref 3.5–5.1)
Sodium: 138 mmol/L (ref 135–145)
Sodium: 138 mmol/L (ref 135–145)

## 2022-10-22 LAB — CBC
HCT: 34.3 % — ABNORMAL LOW (ref 39.0–52.0)
Hemoglobin: 11.2 g/dL — ABNORMAL LOW (ref 13.0–17.0)
MCH: 30.9 pg (ref 26.0–34.0)
MCHC: 32.7 g/dL (ref 30.0–36.0)
MCV: 94.8 fL (ref 80.0–100.0)
Platelets: 152 10*3/uL (ref 150–400)
RBC: 3.62 MIL/uL — ABNORMAL LOW (ref 4.22–5.81)
RDW: 15 % (ref 11.5–15.5)
WBC: 9.1 10*3/uL (ref 4.0–10.5)
nRBC: 0 % (ref 0.0–0.2)

## 2022-10-22 LAB — URINE CULTURE: Culture: >=100000 — AB

## 2022-10-22 LAB — MAGNESIUM: Magnesium: 1.9 mg/dL (ref 1.7–2.4)

## 2022-10-22 MED ORDER — POTASSIUM CHLORIDE 10 MEQ/100ML IV SOLN
10.0000 meq | INTRAVENOUS | Status: DC
  Administered 2022-10-22: 10 meq via INTRAVENOUS
  Filled 2022-10-22: qty 100

## 2022-10-22 MED ORDER — SODIUM CHLORIDE 0.9 % IV BOLUS: 700.0000 mL | Freq: Once | INTRAVENOUS | Status: DC

## 2022-10-22 MED ORDER — SODIUM CHLORIDE 0.9 % IV SOLN: INTRAVENOUS | Status: DC

## 2022-10-22 MED ORDER — ACETAMINOPHEN 10 MG/ML IV SOLN
1000.0000 mg | Freq: Four times a day (QID) | INTRAVENOUS | Status: AC
  Administered 2022-10-22 – 2022-10-23 (×4): 1000 mg via INTRAVENOUS
  Filled 2022-10-22 (×4): qty 100

## 2022-10-22 MED ORDER — ENSURE ENLIVE PO LIQD
237.0000 mL | Freq: Three times a day (TID) | ORAL | Status: DC
  Administered 2022-10-22: 237 mL via ORAL

## 2022-10-22 NOTE — Progress Notes (Signed)
Initial Nutrition Assessment  DOCUMENTATION CODES:   Severe malnutrition in context of chronic illness  INTERVENTION:   D/C ensure  Magic cup TID with meals, each supplement provides 290 kcal and 9 grams of protein  Educated pt/wife on feeding tubes  If proceeds with PEG placement recommend  Osmolite 1.5 at 25 ml/h and increase by 10 ml every 8 hours to goal rate of 55 ml/hr (1320 ml per day) Prosource TF20 60 ml BID  Provides 2140 kcal, 122 gm protein, 1003 ml free water daily  Monitor magnesium and phosphorus every 12 hours x 4 occurrences, MD to replete as needed, as pt is at risk for refeeding syndrome given severe malnutrition.   After labs normal and has reached goal would transition to bolus TF for home.   2 containers Osmolite 1.5 TID Provides: 2130 kcal and 89 grams protein    NUTRITION DIAGNOSIS:   Severe Malnutrition related to chronic illness (trigeminal neuralgia) as evidenced by energy intake < 75% for > or equal to 1 month, 23 percent weight loss < 6 months.  GOAL:   Patient will meet greater than or equal to 90% of their needs  MONITOR:   PO intake, Supplement acceptance  REASON FOR ASSESSMENT:   Consult Assessment of nutrition requirement/status  ASSESSMENT:   Pt with lives at home with wife. He has PMH of MS dx 2007 c/b spastic quadriplegia with chronic foley, wheelchair bound since 2009, depression, trigeminal neuralgia dx 04/2022 with ongoing severe pain requiring multiple ED visits/admissions. Pt now admitted with AKI, CAUTI.    Long discussion with pt and wife. We discussed feeding tubes, how they work and are used. All questions answered.  Per pt/wife pt has been unable to eat due to pain. Only bites most days since July. In <6 months pt has lost 50 lb or 23% of his body weight, confirmed with pt/wife and pictures they provide.  Per wife after losing so much weight pt has started to develop wounds which first appeared to be bruises.  SLP  following patient and has cleared him for a Regular diet. Per SLP his odynophagia is the main contributing factor to his ability to consume adequate nutrition.    Medications reviewed and include: lyrica IV KCl 10 mEq x 5  Labs reviewed: K 2.8  Pt is wheelchair bound NUTRITION - FOCUSED PHYSICAL EXAM:  Flowsheet Row Most Recent Value  Orbital Region Severe depletion  Upper Arm Region No depletion  Thoracic and Lumbar Region No depletion  Buccal Region Severe depletion  Temple Region Severe depletion  Clavicle Bone Region No depletion  Clavicle and Acromion Bone Region No depletion  Scapular Bone Region Unable to assess  Dorsal Hand No depletion  Edema (RD Assessment) Moderate  Hair Reviewed  Eyes Reviewed  Mouth Reviewed  Skin Reviewed  Nails Reviewed       Diet Order:   Diet Order             Diet regular Room service appropriate? Yes; Fluid consistency: Thin  Diet effective now                   EDUCATION NEEDS:   Education needs have been addressed  Skin:  Skin Assessment: Skin Integrity Issues: Skin Integrity Issues:: DTI, Unstageable, Stage I DTI: R/L ischial tuberosity Stage I: L heel Unstageable: vertebral column  Last BM:  1/11 x 1 small, mucous  Height:   Ht Readings from Last 1 Encounters:  10/21/22 5\' 9"  (1.753 m)  Weight:   Wt Readings from Last 1 Encounters:  10/21/22 75.4 kg    BMI:  Body mass index is 24.55 kg/m.  Estimated Nutritional Needs:   Kcal:  2000-2200  Protein:  115-130 grams  Fluid:  >2 L/day  Lockie Pares., RD, LDN, CNSC See AMiON for contact information

## 2022-10-22 NOTE — Progress Notes (Signed)
Internal Medicine Attending:   I have seen and evaluated this patient and I have discussed the plan of care with the house staff. I reviewed the resident's note and I agree with the resident's findings and plan as documented in the resident's note with the following clarification--urine culture w/ fluoroquinolone-resistant Pseudomonas. Given absence of oral options we will continue IV cefepime with plan to evaluate clinical improvement following 5-7 day course. This will additionally provide coverage for evidence of pneumonia on chest imaging w/ concern for obstructive pneumonia from likely mucous plug. Pulmonology recommending chest physiotherapy and repeat CT in 6-8 weeks. Improving facial pain with addition of pregabalin and lamotrigine. Hopefully we will continue to see improvement in pain and oral intake over the next several days.  Charise Killian, MD 10/22/2022, 2:26 PM

## 2022-10-22 NOTE — Progress Notes (Signed)
Subjective: Patient evaluated at the bedside with his wife today. Says pain is at a 2 today after oral meds yesterday. He wants to eat a hoho but wife only able to get ding dongs. Says he is ready to play pick ball tomorrow. No CP,SOB, cough. No back or flank pain.  Objective:  Vital signs in last 24 hours: Vitals:   10/21/22 1953 10/21/22 2338 10/22/22 0402 10/22/22 0825  BP: 101/67  103/62 (!) 98/58  Pulse: 65  68 61  Resp: 11 14 14 14   Temp: 97.6 F (36.4 C) 97.6 F (36.4 C) 97.6 F (36.4 C) 97.7 F (36.5 C)  TempSrc: Oral Oral Oral Oral  SpO2: 100%  97% 99%  Weight:      Height:      Grossly unchanged since prior days Gen: Chronically ill appearing, no acute distress, pleasant and interactive, comfortable HEENT: Poor dentition, dry mucous membranes CV:RRR, normal s1/s2, 2+ bilateral radial pulses Abd: soft,NT,ND, normal active bowel sounds Pulm:normal wob, LCTAB at the anterior lung fields Extremities: cool, dry, bilateral DP not palpable, R LE 1+pitting edema to the mid shin  Assessment/Plan:  Principal Problem:   Right trigeminal neuralgia Active Problems:   Urinary tract infection   Pressure injury of skin  [name] is a 62 y/o male with a pmh of MS c/b quadriplegia and chronic foley catheter use, trigeminal neuralgia, depression who presents for poor PO intake secondary to trigeminal neuralgia and found to have CAP and cystitis.  Trigeminal Neuralgia Patient with significant improvement of pain after lyrica yesterday reporting a pain score of 2. Has not eaten much other than apple sauce with meds but wants to attempt to eat more today. Will continue to monitor intake as pain control improves. Given 50lbs weight loss since July I do suspect nutrition will continue to be an issue. Will continue to think about the need for tube feeding as a long term option as the patients oral intake is better evaluated. -neurology consulted -Dilaudid 0.5 mg IV q8hrs  prn -Continue lamotrigine 25mg  daily for 14 days -Continue lyrica 75 mg TID -SLP and RD consulted  CAUTI Indwelling foley catheter Now with >100K CFU pseudomonas resistant to cipro and sensitive to cefepime. Overall clinically stable from an infectious standpoint, afebrile, wbc 18.3 to 9.1 and normalized. No evidence of pyelo on admission CT and no flank/back pain.Do not have oral options given cipro resistance. Will determine treatment with IV antibiotics for 5-7 days pending trigeminal neuralgia pain. Today will be day 3 of treatment. If improved trigeminal neuralgia no longer needing hospitalization will opt for shorter course for discharge. -IV cefepime -per pharmacy dosing -daily cbc  LLL bronchial obstruction Evaluated by pulmonology yesterday who felt the obstruction was mucous plugging. Recommend repeat CT in 6-8 weeks and if still present would opt for bronch.For now just supportive measures and chest physiotherapy.. -daily cbc -chest physiotherapy q8hrs  Hypokalemia Hypomagnesemia Potassium to 2.9, Mg to 1.9. Do suspect there is GI loss of potassium given numerous loose stools yesterday evening and night. -Potassium 35mEq Ivx5 hours -repeat BMP @1900    Unstageable bilateral ischial wounds Bilateral wounds of the medial gluteal folds noted with surrounding erythema, induration, and serosanguinous appearing drainage. These have been evolving over the past weeks and seem to be in an area where the patient's loose stools are likely causing skin breakdown. -wound consult -medihoney to the wounds   MS Spastic quadriplegia Sees Dr. Felecia Shelling with [facility], last office visit 06/2022. Diagnosed in  2007, questionable symptoms dating back to 2001.No MS exacerbation since 2018-19 on Ocrevus.    History of depression -Holding Lexapro  Prior to Admission Living Arrangement: Anticipated Discharge Location: Barriers to Discharge: Dispo: Anticipated discharge in  approximately >2 day(s).   Mark Coach, MD 10/22/2022, 11:44 AM Pager: Mark Coach, MD Internal Medicine Resident, PGY-1 [facility]  Pager: [phone] After 5pm on weekdays and 1pm on weekends: On Call pager [phone]

## 2022-10-22 NOTE — Progress Notes (Signed)
Speech Language Pathology Treatment: Dysphagia  Patient Details Name: [name] MRN: [id number] DOB: Sep 20, 1961 Today's Date: 10/22/2022 Time: 0867-6195 SLP Time Calculation (min) (ACUTE ONLY): 27 min  Assessment / Plan / Recommendation Clinical Impression  Pt seems to be in much brighter spirits today after finding relief from pain. SLP coordinated with RN for timing of session with pt pre-medicated, but even before receiving meds reported that his pain had been down to a 2. He tried a few consistencies (water, applesauce, chocolate cake), all kept at room temperature and administered on the L side of this mouth. He denied any pain and showed no overt signs of dysphagia or aspiration. Note that pt took very small pieces of food and did not want very much. Education was provided about current order for regular solids and thin liquids and menu obtained. Encouraged him to find items from the menu that he feels he may be able to eat and drink, but he does not need to be restricted from anything in particular. SLP will f/u briefly while inpatient but do not anticipate any f/u SLP needs.   HPI HPI: Pt is a 62 yo male presenting with poor PO intake secondary to trigeminal neuralgia. Per H&P note pt has been choking with swallowing and mainly trying pureed foods. CT concerning for PNA. Per MD H&P, does not seem clinically consistent with PNA. Most recent swallow eval in September 2023 with functional appearing swallow but limited by TN pain. MBS in 08/03/14 revealed functional swallow with mild delays in mastication, good pharyngeal clearance and no aspiration; however, at the time pt described fatigue impacting function during meals. PMH: MS c/b spastic quadriplegia and chronic foley catheter use, depression      SLP Plan  Continue with current plan of care      Recommendations for follow up therapy are one component of a multi-disciplinary discharge planning process, led by the attending  physician.  Recommendations may be updated based on patient status, additional functional criteria and insurance authorization.    Recommendations  Diet recommendations: Regular;Thin liquid Liquids provided via: Cup;Straw Medication Administration: Whole meds with puree Supervision: Trained caregiver to feed patient;Staff to assist with self feeding Compensations: Slow rate;Small sips/bites Postural Changes and/or Swallow Maneuvers: Seated upright 90 degrees                Oral Care Recommendations: Oral care BID Follow Up Recommendations: No SLP follow up Assistance recommended at discharge: Frequent or constant Supervision/Assistance SLP Visit Diagnosis: Dysphagia, unspecified (R13.10) Plan: Continue with current plan of care           Osie Bond., M.A. [place] Office [phone]  Secure chat preferred   10/22/2022, 3:24 PM

## 2022-10-23 DIAGNOSIS — N3000 Acute cystitis without hematuria: Secondary | ICD-10-CM | POA: Diagnosis not present

## 2022-10-23 DIAGNOSIS — G500 Trigeminal neuralgia: Secondary | ICD-10-CM | POA: Diagnosis not present

## 2022-10-23 DIAGNOSIS — E876 Hypokalemia: Secondary | ICD-10-CM

## 2022-10-23 DIAGNOSIS — E43 Unspecified severe protein-calorie malnutrition: Secondary | ICD-10-CM | POA: Insufficient documentation

## 2022-10-23 LAB — CBC
HCT: 32 % — ABNORMAL LOW (ref 39.0–52.0)
Hemoglobin: 10.6 g/dL — ABNORMAL LOW (ref 13.0–17.0)
MCH: 30.8 pg (ref 26.0–34.0)
MCHC: 33.1 g/dL (ref 30.0–36.0)
MCV: 93 fL (ref 80.0–100.0)
Platelets: 156 10*3/uL (ref 150–400)
RBC: 3.44 MIL/uL — ABNORMAL LOW (ref 4.22–5.81)
RDW: 14.7 % (ref 11.5–15.5)
WBC: 7 10*3/uL (ref 4.0–10.5)
nRBC: 0 % (ref 0.0–0.2)

## 2022-10-23 LAB — BASIC METABOLIC PANEL
Anion gap: 12 (ref 5–15)
Anion gap: 11 (ref 5–15)
BUN: 10 mg/dL (ref 6–20)
BUN: 7 mg/dL (ref 6–20)
CO2: 23 mmol/L (ref 22–32)
CO2: 24 mmol/L (ref 22–32)
Calcium: 7.3 mg/dL — ABNORMAL LOW (ref 8.9–10.3)
Calcium: 7.9 mg/dL — ABNORMAL LOW (ref 8.9–10.3)
Chloride: 101 mmol/L (ref 98–111)
Chloride: 101 mmol/L (ref 98–111)
Creatinine, Ser: 0.71 mg/dL (ref 0.61–1.24)
Creatinine, Ser: 0.7 mg/dL (ref 0.61–1.24)
GFR, Estimated: >60 mL/min (ref >60)
GFR, Estimated: >60 mL/min (ref >60)
Glucose, Bld: 77 mg/dL (ref 70–99)
Glucose, Bld: 103 mg/dL — ABNORMAL HIGH (ref 70–99)
Potassium: 2.6 mmol/L — CL (ref 3.5–5.1)
Potassium: 2.8 mmol/L — ABNORMAL LOW (ref 3.5–5.1)
Sodium: 136 mmol/L (ref 135–145)
Sodium: 136 mmol/L (ref 135–145)

## 2022-10-23 LAB — MAGNESIUM: Magnesium: 1.7 mg/dL (ref 1.7–2.4)

## 2022-10-23 MED ORDER — POTASSIUM CHLORIDE CRYS ER 20 MEQ PO TBCR
40.0000 meq | EXTENDED_RELEASE_TABLET | ORAL | Status: AC
  Administered 2022-10-23 (×2): 40 meq via ORAL
  Filled 2022-10-23 (×2): qty 2

## 2022-10-23 MED ORDER — POTASSIUM CHLORIDE 20 MEQ PO PACK
40.0000 meq | PACK | Freq: Two times a day (BID) | ORAL | Status: DC
  Filled 2022-10-23: qty 2

## 2022-10-23 MED ORDER — POTASSIUM CHLORIDE CRYS ER 20 MEQ PO TBCR
40.0000 meq | EXTENDED_RELEASE_TABLET | Freq: Two times a day (BID) | ORAL | Status: DC
  Administered 2022-10-23: 40 meq via ORAL
  Filled 2022-10-23: qty 2

## 2022-10-23 MED ORDER — ACETAMINOPHEN 500 MG PO TABS
1000.0000 mg | ORAL_TABLET | Freq: Once | ORAL | Status: AC
  Administered 2022-10-23: 1000 mg via ORAL
  Filled 2022-10-23: qty 2

## 2022-10-23 MED ORDER — SODIUM CHLORIDE 0.9 % IV SOLN
2.0000 g | Freq: Three times a day (TID) | INTRAVENOUS | Status: DC
  Administered 2022-10-23 – 2022-10-24 (×4): 2 g via INTRAVENOUS
  Filled 2022-10-23 (×4): qty 12.5

## 2022-10-23 NOTE — Progress Notes (Signed)
Subjective: Patient evaluated at the bedside this AM.Pt states he is feeling terrific, ate pot roast, green beans,graham crackers and one ding dong yesterday with no pain or dysphagia. Pain is 0/10 today. Still having loose bowel movements, but only one. Denies belly pain.NO SOB, no chest pain. Feels back to normal self.   Objective:  Vital signs in last 24 hours: Vitals:   10/22/22 1910 10/23/22 0000 10/23/22 0400 10/23/22 1032  BP: (!) 100/59 (!) 100/57 102/64 107/62  Pulse: 71 60 (!) 48 72  Resp: 16 14 14 18   Temp:  97.7 F (36.5 C)  97.8 F (36.6 C)  TempSrc:  Oral  Oral  SpO2: 97% 97% 96% 98%  Weight:      Height:      Grossly unchanged since prior days Gen: Chronically ill appearing, no acute distress, pleasant and interactive, comfortable HEENT: Poor dentition, dry mucous membranes CV:RRR, normal s1/s2, 2+ bilateral radial pulses Abd: soft,NT,ND, normal active bowel sounds Pulm:normal wob, LCTAB at the anterior lung fields Extremities: cool, dry, bilateral DP not palpable, Bilateral LE 1+pitting edema to the mid shin  Assessment/Plan:  Principal Problem:   Right trigeminal neuralgia Active Problems:   Urinary tract infection   Pressure injury of skin   Pneumonia of left lower lobe due to infectious organism  [name] is a 62 y/o male with a pmh of MS c/b quadriplegia and chronic foley catheter use, trigeminal neuralgia, depression who presents for poor PO intake secondary to trigeminal neuralgia and found to have CAP and cystitis.  Trigeminal Neuralgia Patient continuing to have improvement of pain now 0/10 today. He reports significantly better oral intake yesterday which he tolerated well. Feeling back to his normal self. Do not think a feeding tube/ G tube are in consideration at this point.Will continue current therapy and continue to monitor. -neurology consulted -Continue lamotrigine 25mg  daily for 14 days total(Start 1/10) -Continue lyrica 75 mg TID -SLP  and RD consulted  CAUTI Indwelling foley catheter Remains afebrile, HDS, WBC downtrending to  7. No back or flank pain. Today is day 4 of UTI treatment with IC cefepime. Requires IV as his pseudomonas is resistant to the only oral option, cipro. Will require two doses of cefepime tomorrow before he can discharge as 5 days of treatment is 15 total doses and he did receive one initial dose the day of admission, and after today will have completed 13 total doses. -IV cefepime -per pharmacy dosing -daily cbc  Normocytic anemia Patient presented with Hgb to 15.7 on admission now to 10.6. Platelets at admission 299 now at 156. Do suspect this was suppression in the setting of UTI. Will continue to trend labs in the outpatient setting as there is no evidence of GI blood loss at this time. Some of this could be nutrition related as well. His MCV is normal at 93 but he may have a mixed microcytic and macrocytic anemia. Would be good to get B12,folate, iron studies in the outpatient setting.  LLL bronchial obstruction Evaluated by pulmonology  who felt the obstruction was mucous plugging. Recommend repeat CT in 6-8 weeks and if still present would opt for bronch.For now just supportive measures and chest physiotherapy.. -daily cbc -chest physiotherapy q8hrs  Hypokalemia HypomagnesemiaBradycardia Patient bradycardic to 48 overnight. EKG this AM with NSR.Potassium to 2.6, Mg to 1.7. I suspect the bradycardia was in the setting of hypokalemia. The hypokalemia is likely due to malnutrition and GI losses form loose stools. -Potassium 3mEq x2 -repeat  BMP @1700    Unstageable bilateral ischial wounds Bilateral wounds of the medial gluteal folds noted with surrounding erythema, induration, and serosanguinous appearing drainage. These have been evolving over the past weeks and seem to be in an area where the patient's loose stools are likely causing skin breakdown. -wound consult -medihoney to the wounds    MS Spastic quadriplegia Sees Dr. Felecia Shelling with [facility], last office visit 06/2022. Diagnosed in 2007, questionable symptoms dating back to 2001.No MS exacerbation since 2018-19 on Ocrevus.    History of depression -Holding Lexapro  Prior to Admission Living Arrangement: Anticipated Discharge Location: Barriers to Discharge: Dispo: Anticipated discharge in approximately >2 day(s).   Iona Coach, MD 10/23/2022, 2:00 PM Pager: Iona Coach, MD Internal Medicine Resident, PGY-1 [facility]  Pager: [phone] After 5pm on weekdays and 1pm on weekends: On Call pager [phone]

## 2022-10-24 DIAGNOSIS — G500 Trigeminal neuralgia: Secondary | ICD-10-CM | POA: Diagnosis not present

## 2022-10-24 LAB — BASIC METABOLIC PANEL
Anion gap: 10 (ref 5–15)
Anion gap: 9 (ref 5–15)
BUN: 9 mg/dL (ref 6–20)
BUN: 8 mg/dL (ref 6–20)
CO2: 26 mmol/L (ref 22–32)
CO2: 24 mmol/L (ref 22–32)
Calcium: 8 mg/dL — ABNORMAL LOW (ref 8.9–10.3)
Calcium: 7.9 mg/dL — ABNORMAL LOW (ref 8.9–10.3)
Chloride: 102 mmol/L (ref 98–111)
Chloride: 106 mmol/L (ref 98–111)
Creatinine, Ser: 0.75 mg/dL (ref 0.61–1.24)
Creatinine, Ser: 0.54 mg/dL — ABNORMAL LOW (ref 0.61–1.24)
GFR, Estimated: >60 mL/min (ref >60)
GFR, Estimated: >60 mL/min (ref >60)
Glucose, Bld: 115 mg/dL — ABNORMAL HIGH (ref 70–99)
Glucose, Bld: 154 mg/dL — ABNORMAL HIGH (ref 70–99)
Potassium: 3.3 mmol/L — ABNORMAL LOW (ref 3.5–5.1)
Potassium: 3.5 mmol/L (ref 3.5–5.1)
Sodium: 138 mmol/L (ref 135–145)
Sodium: 139 mmol/L (ref 135–145)

## 2022-10-24 LAB — CULTURE, BLOOD (ROUTINE X 2)
Culture: NO GROWTH
Culture: NO GROWTH

## 2022-10-24 LAB — MAGNESIUM: Magnesium: 1.7 mg/dL (ref 1.7–2.4)

## 2022-10-24 MED ORDER — PREGABALIN 75 MG PO CAPS: 75.0000 mg | ORAL_CAPSULE | Freq: Three times a day (TID) | ORAL | 0 refills | Status: DC

## 2022-10-24 MED ORDER — MEDIHONEY WOUND/BURN DRESSING EX PSTE: 1.0000 | PASTE | Freq: Every day | CUTANEOUS | 0 refills | Status: DC

## 2022-10-24 MED ORDER — POTASSIUM CHLORIDE CRYS ER 20 MEQ PO TBCR
40.0000 meq | EXTENDED_RELEASE_TABLET | Freq: Once | ORAL | Status: AC
  Administered 2022-10-24: 40 meq via ORAL
  Filled 2022-10-24: qty 2

## 2022-10-24 MED ORDER — POTASSIUM CHLORIDE CRYS ER 20 MEQ PO TBCR: 40.0000 meq | EXTENDED_RELEASE_TABLET | Freq: Every day | ORAL | 0 refills | Status: DC

## 2022-10-24 MED ORDER — LAMOTRIGINE 100 MG PO TABS: 100.0000 mg | ORAL_TABLET | Freq: Every day | ORAL | 0 refills | Status: DC

## 2022-10-24 MED ORDER — LAMOTRIGINE 25 MG PO TABS: 25.0000 mg | ORAL_TABLET | Freq: Every day | ORAL | 0 refills | Status: DC

## 2022-10-24 MED ORDER — LAMOTRIGINE 25 MG PO TABS: 50.0000 mg | ORAL_TABLET | Freq: Every day | ORAL | 0 refills | Status: DC

## 2022-10-24 NOTE — Discharge Summary (Signed)
Name: [name] MRN: [id number] DOB: 1961-10-07 62 y.o. PCP: Agustina Caroli, MD  Date of Admission: 10/19/2022  7:38 AM Date of Discharge: 1/13/12024 2:53 PM Attending Physician: Inez Catalina, MD  Discharge Diagnosis: 1. Principal Problem:   Right trigeminal neuralgia Active Problems:   Urinary tract infection   Pressure injury of skin   Pneumonia of left lower lobe due to infectious organism   Hypokalemia   Protein-calorie malnutrition, severe Bilateral unstageable ischial pressure wounds LLL bronchial obstruction Normocytic anemia Thrombocytopenia  Discharge Medications: Allergies as of 10/24/2022       Reactions   Gadopiclenol Nausea And Vomiting        Medication List     STOP taking these medications    ciprofloxacin 500 MG tablet Commonly known as: CIPRO   Potassium Chloride 10 MEQ Pack       TAKE these medications    acetaminophen 325 MG tablet Commonly known as: TYLENOL Take 2 tablets (650 mg total) by mouth every 6 (six) hours as needed for mild pain, fever or moderate pain (or Fever >/= 101).   albuterol 108 (90 Base) MCG/ACT inhaler Commonly known as: VENTOLIN HFA Inhale 2 puffs into the lungs every 6 (six) hours as needed. What changed:  how much to take reasons to take this   Dextromethorphan-quiNIDine 20-10 MG capsule Commonly known as: NUEDEXTA Take 1 tablet by mouth 2 (two) times daily.   escitalopram 10 MG tablet Commonly known as: LEXAPRO Take 10 mg by mouth daily.   ibuprofen 200 MG tablet Commonly known as: ADVIL Take 600 mg by mouth every 6 (six) hours as needed for headache or moderate pain.   lamoTRIgine 25 MG tablet Commonly known as: LAMICTAL Take 1 tablet (25 mg total) by mouth daily for 10 days. Start taking on: October 25, 2022   lamoTRIgine 25 MG tablet Commonly known as: LAMICTAL Take 2 tablets (50 mg total) by mouth daily for 14 days. Start taking on: November 04, 2022   lamoTRIgine 100 MG  tablet Commonly known as: LAMICTAL Take 1 tablet (100 mg total) by mouth daily for 7 days. Start taking on: November 18, 2022   leptospermum manuka honey Pste paste Apply 1 Application topically daily.   modafinil 100 MG tablet Commonly known as: PROVIGIL TAKE 1 TABLET (100 MG TOTAL) BY MOUTH IN THE MORNING. *SENT FOR PA* What changed: See the new instructions.   ocrelizumab 600 mg in sodium chloride 0.9 % 500 mL Inject 600 mg into the vein every 6 (six) months.   ondansetron 4 MG disintegrating tablet Commonly known as: ZOFRAN-ODT 4mg  ODT q4 hours prn nausea/vomit   oxyCODONE-acetaminophen 5-325 MG tablet Commonly known as: Percocet Take 1 tablet by mouth every 6 (six) hours as needed for severe pain.   potassium chloride SA 20 MEQ tablet Commonly known as: KLOR-CON M Take 2 tablets (40 mEq total) by mouth daily. What changed:  medication strength how much to take when to take this   pregabalin 75 MG capsule Commonly known as: LYRICA Take 1 capsule (75 mg total) by mouth 3 (three) times daily.   Theratears 1 % Gel Generic drug: Carboxymethylcellulose Sod PF Place 1 drop into both eyes daily as needed (dry eyes).               Discharge Care Instructions  (From admission, onward)           Start     Ordered   10/24/22 0000  Discharge wound  care:       Comments: Per nursing   10/24/22 1453            Disposition and follow-up:   [name] was discharged from [facility] in Good condition.  At the hospital follow up visit please address:  1.  Normocytic anemiaThrombocytopenia Would be good to get B12,folate, iron studies in the outpatient setting  Hypokalemia HypomagnesemiaBradycardia -Started on potassium outpatient, titrate as indicated -repeat Mg,BMP  LLL bronchial obstruction Repeat CT chest 6-8 weeks  2.  Labs / imaging needed at time of follow-up: CBC, iron studies, folate, B12,Mg,BMP  3.  Pending  labs/ test needing follow-up: NA  Follow-up Appointments:   Hospital Course by problem list:  Trigeminal Neuralgia Patient presented with reported 50lbs weight loss since July when he was diagnosed with trigeminal neuralgia.Home meds include oxcarbazepine for trigeminal neuralgia and gabapentin for dysethesias which have not been helpful.Had nerve block performed in 08/2022 and it appears this has not had much benefit. Also had ablation in December 2023 with only a few weeks of symptom relief.Patient was evaluated by neurology and was given an IV lidocaine infusion with some pain releif. He was started on lyrica 75 TID and lamotrigine 25mg  daily for 14 days total(Start 1/10). Will increase the lamotrigine after to 50mg  x14 doses, then 100mg  x 7 doses to reach therapeutic levels. Patient was evaluated by RD and SLP in the hospital. His pain and orally intake improved. At the time of discharge the patient was back to baseline in regard to mentation, functional status, ability to eat, and pain. No role for G tube at this time. Will need to follow up with his outpatient neurologist.   CAUTI Indwelling foley catheter WBC elevated to 24.7 on admission. Urine collected after foley exchange in the ED.Found to have >100K CFU of pseudomonas Aeruginosa that was ciprofloxacin resistant.No back or flank pain and no evidence of pyelo on admission CT A/P, but did have evidence of cystitis on CT. Remained afebrile, HDS, WBC downtrending to 7.0 wnl at discharge after 15 doses of q8hr IV cefepime.   Hypokalemia HypomagnesemiaBradycardia Patient persistently hypokalemic with a low of 2.6 in the setting of persistent loose stools and poor oral intake.Patient bradycardic to 48 during admission although EKG only demonstrated NSR, thought to be in the setting of hypokalemia. Potassium repleted throughout admission and found to be 3.5 on discharge. Will discharge with potassium daily and will need close follow  up.   Normocytic anemia Patient presented with Hgb to 15.7 ,at 10.6 on discharge. Platelets at admission 299,at 156 at discharge. Do suspect this was suppression in the setting of UTI. Will continue to trend labs in the outpatient setting as there is no evidence of GI blood loss at this time. Some of this could be nutrition related as well. His MCV is normal at 93 but he may have a mixed microcytic and macrocytic anemia. Would be good to get B12,folate, iron studies in the outpatient setting.   LLL bronchial obstruction Evaluated by pulmonology  who felt the obstruction was mucous plugging. Recommend repeat CT in 6-8 weeks and if still present would opt for bronch.Got chest physiotherapy during admission.   Unstageable bilateral ischial wounds Bilateral wounds of the medial gluteal folds noted with surrounding erythema, induration, and serosanguinous appearing drainage. These have been evolving over the past weeks and seem to be in an area where the patient's loose stools are likely causing skin breakdown. Wound team consulted  and recommended medihoney.   MS Spastic quadriplegia Sees Dr. Felecia Shelling with [facility], last office visit 06/2022. Diagnosed in 2007, questionable symptoms dating back to 2001.No MS exacerbation since 2018-19 on Ocrevus.    History of depression Restarted Lexapro at discharge.  Subjective Evaluated at the bedside with his wife. Feeling well today, says pain is 0/10. No CP,SOB,abdominal pain. 4 watery/jelly like stools overnight. Says this is intermittently normal for him. Attributes it to antibiotics and resuming eating. At baseline unable to have Bms due to MS and requires laxitives. Tolerating food well and asking when he can go home.  Discharge Exam:   BP 111/67 (BP Location: Right Arm)   Pulse 61   Temp 97.8 F (36.6 C) (Oral)   Resp 17   Ht 5\' 9"  (1.753 m)   Wt 79.4 kg   SpO2 96%   BMI 25.84 kg/m  Discharge exam:  Gen: Chronically ill  appearing, no acute distress, pleasant and interactive, comfortable HEENT: Poor dentition,mucous membranes moist CV:RRR, normal s1/s2, 2+ bilateral radial pulses Abd: soft,NT,ND, normal active bowel sounds Pulm:normal wob, LCTAB at the anterior lung fields Extremities: cool, dry, bilateral DP not palpable, no LE edema  Pertinent Labs, Studies, and Procedures:     Latest Ref Rng & Units 10/23/2022    5:58 AM 10/22/2022    7:26 AM 10/21/2022    2:21 AM  CBC  WBC 4.0 - 10.5 K/uL 7.0  9.1  18.3   Hemoglobin 13.0 - 17.0 g/dL 10.6  11.2  11.0   Hematocrit 39.0 - 52.0 % 32.0  34.3  32.3   Platelets 150 - 400 K/uL 156  152  131        Latest Ref Rng & Units 10/24/2022    1:21 PM 10/24/2022    1:36 AM 10/23/2022    4:51 PM  BMP  Glucose 70 - 99 mg/dL 154  115  103   BUN 6 - 20 mg/dL 8  9  7    Creatinine 0.61 - 1.24 mg/dL 0.54  0.75  0.70   Sodium 135 - 145 mmol/L 139  138  136   Potassium 3.5 - 5.1 mmol/L 3.5  3.3  2.8   Chloride 98 - 111 mmol/L 106  102  101   CO2 22 - 32 mmol/L 24  26  24    Calcium 8.9 - 10.3 mg/dL 7.9  8.0  7.9    CT CHEST ABDOMEN PELVIS W CONTRAST  Result Date: 10/19/2022 CLINICAL DATA:  Sepsis.  Mental status change. EXAM: CT CHEST, ABDOMEN, AND PELVIS WITH CONTRAST TECHNIQUE: Multidetector CT imaging of the chest, abdomen and pelvis was performed following the standard protocol during bolus administration of intravenous contrast. RADIATION DOSE REDUCTION: This exam was performed according to the departmental dose-optimization program which includes automated exposure control, adjustment of the mA and/or kV according to patient size and/or use of iterative reconstruction technique. CONTRAST:  30mL OMNIPAQUE IOHEXOL 350 MG/ML SOLN COMPARISON:  CT abdomen pelvis, 09/27/2022. Chest radiograph, 10/19/2022. CTA chest, 02/23/2020. FINDINGS: CT CHEST FINDINGS Cardiovascular: Heart normal in size configuration. No pericardial effusion. Minimal right coronary artery calcification.  Great vessels are normal in caliber. Arch branch vessels are widely patent. Mediastinum/Nodes: Normal thyroid. No neck base, mediastinal or hilar masses. No enlarged lymph nodes. Trachea and esophagus are unremarkable. Lungs/Pleura: Left lower lobe bronchus is occluded, presumably by mucous plugging. Patchy ground-glass and more confluent consolidation noted in the left lower lobe. Mild dependent opacity noted in the right lower lobe  consistent with atelectasis. Minimal dependent atelectasis in the left upper lobe lingula and right middle lobe. Remainder of the lungs is clear. No pleural effusion.  No pneumothorax. Musculoskeletal: No fracture.  No bone lesion.  No chest wall mass. CT ABDOMEN PELVIS FINDINGS Hepatobiliary: Liver normal in overall size attenuation. There are several low-attenuation liver lesions, largest arising from the anterior liver just below the dome, 1.5 cm. Others are all subcentimeter. These all suspected to be cysts and are similar to the prior CT. Normal gallbladder. No bile duct dilation. Pancreas: Unremarkable. No pancreatic ductal dilatation or surrounding inflammatory changes. Spleen: Normal in size without focal abnormality. Adrenals/Urinary Tract: Normal adrenal glands. Bilateral renal cortical thinning. Multiple small, 5 mm in last, renal lesions, too small to characterize, likely cysts. No follow-up recommended. No stones. No hydronephrosis. Ureters normal in course and in caliber. Dependent density noted in the bladder consistent with stones. Bladder wall mildly thickened. Small amount of non dependent air. No bladder mass. Foley catheter lies within the bladder, well positioned. Stomach/Bowel: Normal stomach. Small bowel and colon are normal in caliber. No wall thickening. No inflammation. Normal appendix. Vascular/Lymphatic: Mild aortic atherosclerosis. No aneurysm. No enlarged lymph nodes. Reproductive: Unremarkable. Other: No ascites. Musculoskeletal: No acute fracture. No bone  lesion. 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 No spondylolisthesis. IMPRESSION: 1. Occluded left lower lobe bronchus. Patchy opacities noted in the left lower lobe consistent with pneumonia. 2. Bladder wall thickening. Dependent density consistent with stones in the bladder. Suspect cystitis. No CT evidence of pyelonephritis and no findings of obstructive uropathy. 3. No other acute abnormalities within the chest, abdomen or pelvis. Electronically Signed   By: Lajean Manes M.D.   On: 10/19/2022 15:31   CT Maxillofacial W Contrast  Result Date: 10/19/2022 CLINICAL DATA:  Sensory abnormality, trigeminal origin (CN 5) EXAM: CT MAXILLOFACIAL WITH CONTRAST TECHNIQUE: Multidetector CT imaging of the maxillofacial structures was performed with intravenous contrast. Multiplanar CT image reconstructions were also generated. RADIATION DOSE REDUCTION: This exam was performed according to the departmental dose-optimization program which includes automated exposure control, adjustment of the mA and/or kV according to patient size and/or use of iterative reconstruction technique. CONTRAST:  89mL OMNIPAQUE IOHEXOL 350 MG/ML SOLN COMPARISON:  CT head August 19, 2022. FINDINGS: Osseous: No fracture or mandibular dislocation. No destructive process. Orbits: Negative. No traumatic or inflammatory finding. Sinuses: Clear. Soft tissues: Negative. Limited intracranial: No significant or unexpected finding. IMPRESSION: No evidence of acute fracture.  Clear sinuses. Electronically Signed   By: Margaretha Sheffield M.D.   On: 10/19/2022 15:24   CT Head Wo Contrast  Result Date: 10/19/2022 CLINICAL DATA:  Sepsis, mental status change EXAM: CT HEAD WITHOUT CONTRAST TECHNIQUE: Contiguous axial images were obtained from the base of the skull through the vertex without intravenous contrast. RADIATION DOSE REDUCTION: This exam was performed according to the departmental dose-optimization program which includes automated exposure control, adjustment  of the mA and/or kV according to patient size and/or use of iterative reconstruction technique. COMPARISON:  08/19/2022 FINDINGS: Brain: Stable atrophy pattern. No acute intracranial hemorrhage, mass lesion, new infarction, midline shift, herniation, hydrocephalus, or extra-axial fluid collection. No focal mass effect or edema. Cisterns are patent. No gross cerebellar abnormality. Vascular: No hyperdense vessel or unexpected calcification. Skull: Normal. Negative for fracture or focal lesion. Sinuses/Orbits: No acute finding. Other: None. IMPRESSION: 1. Stable atrophy pattern. 2. No acute intracranial abnormality by noncontrast CT. No interval change. Electronically Signed   By: Jerilynn Mages.  Shick M.D.   On: 10/19/2022 15:24  DG Chest Port 1 View  Result Date: 10/19/2022 CLINICAL DATA:  Sepsis.  Asthma EXAM: PORTABLE CHEST 1 VIEW COMPARISON:  08/19/2022 FINDINGS: Normal heart size. Low lung volumes. Minimal linear scarring or atelectasis bilaterally. Lungs are otherwise clear. No pleural effusion or pneumothorax. IMPRESSION: Low lung volumes with minimal linear scarring or atelectasis bilaterally. Electronically Signed   By: Duanne Guess D.O.   On: 10/19/2022 08:21     Discharge Instructions: Discharge Instructions     Diet - low sodium heart healthy   Complete by: As directed    Discharge wound care:   Complete by: As directed    Per nursing   Increase activity slowly   Complete by: As directed      You were hospitalized for trigeminal neuralgia and found to have a urinary tract infection. You were treated for your infection with 5 days of antibiotics. Neurology saw you for your trigeminal neuralgia. You got a lidocaine IV infusion per their recommendation. You were started on lyrica and lamotrigine. You will slowly increase lamotrigine over the next few weeks as indicated in your medication list. Your pain was better controlled by discharge and you were able to eat again. You should follow up with  your PCP within 1-2 weeks and should also follow up with your neurologist within 2 weeks of discharge. Your potassium was also found to be low in the setting of diarrhea and poor oral intake. We are discharging you on of oral potassium daily. It will be important to have blood work repeated with your pcp to determine the need to continue this or not.  Signed: Willette Cluster, MD 10/24/2022, 2:53 PM   Pager: Willette Cluster, MD Internal Medicine Resident, PGY-1 [facility]  Pager: [phone]

## 2022-11-07 ENCOUNTER — Inpatient Hospital Stay (HOSPITAL_COMMUNITY)
Admission: EM | Admit: 2022-11-07 | Discharge: 2022-11-21 | DRG: 073 | Disposition: A | Discharge status: Home Health | Payer: No Typology Code available for payment source | Attending: Student in an Organized Health Care Education/Training Program | Admitting: Student in an Organized Health Care Education/Training Program

## 2022-11-07 ENCOUNTER — Encounter (HOSPITAL_COMMUNITY): Payer: Self-pay | Admitting: Infectious Diseases

## 2022-11-07 ENCOUNTER — Other Ambulatory Visit: Payer: Self-pay

## 2022-11-07 DIAGNOSIS — N5089 Other specified disorders of the male genital organs: Secondary | ICD-10-CM | POA: Diagnosis present

## 2022-11-07 DIAGNOSIS — F32A Depression, unspecified: Secondary | ICD-10-CM | POA: Diagnosis present

## 2022-11-07 DIAGNOSIS — Z79899 Other long term (current) drug therapy: Secondary | ICD-10-CM

## 2022-11-07 DIAGNOSIS — G8250 Quadriplegia, unspecified: Secondary | ICD-10-CM | POA: Diagnosis present

## 2022-11-07 DIAGNOSIS — E876 Hypokalemia: Secondary | ICD-10-CM | POA: Diagnosis present

## 2022-11-07 DIAGNOSIS — Z87891 Personal history of nicotine dependence: Secondary | ICD-10-CM

## 2022-11-07 DIAGNOSIS — N4889 Other specified disorders of penis: Secondary | ICD-10-CM | POA: Diagnosis present

## 2022-11-07 DIAGNOSIS — E43 Unspecified severe protein-calorie malnutrition: Secondary | ICD-10-CM | POA: Insufficient documentation

## 2022-11-07 DIAGNOSIS — R339 Retention of urine, unspecified: Secondary | ICD-10-CM | POA: Diagnosis present

## 2022-11-07 DIAGNOSIS — R17 Unspecified jaundice: Secondary | ICD-10-CM | POA: Diagnosis present

## 2022-11-07 DIAGNOSIS — Z888 Allergy status to other drugs, medicaments and biological substances status: Secondary | ICD-10-CM

## 2022-11-07 DIAGNOSIS — E8809 Other disorders of plasma-protein metabolism, not elsewhere classified: Secondary | ICD-10-CM | POA: Diagnosis present

## 2022-11-07 DIAGNOSIS — M62838 Other muscle spasm: Secondary | ICD-10-CM | POA: Diagnosis present

## 2022-11-07 DIAGNOSIS — B9729 Other coronavirus as the cause of diseases classified elsewhere: Secondary | ICD-10-CM | POA: Diagnosis present

## 2022-11-07 DIAGNOSIS — Z6825 Body mass index (BMI) 25.0-25.9, adult: Secondary | ICD-10-CM

## 2022-11-07 DIAGNOSIS — R600 Localized edema: Secondary | ICD-10-CM | POA: Diagnosis present

## 2022-11-07 DIAGNOSIS — L89106 Pressure-induced deep tissue damage of unspecified part of back: Secondary | ICD-10-CM | POA: Diagnosis present

## 2022-11-07 DIAGNOSIS — N319 Neuromuscular dysfunction of bladder, unspecified: Secondary | ICD-10-CM | POA: Diagnosis present

## 2022-11-07 DIAGNOSIS — Z515 Encounter for palliative care: Secondary | ICD-10-CM

## 2022-11-07 DIAGNOSIS — D649 Anemia, unspecified: Secondary | ICD-10-CM | POA: Insufficient documentation

## 2022-11-07 DIAGNOSIS — G35 Multiple sclerosis: Secondary | ICD-10-CM | POA: Diagnosis present

## 2022-11-07 DIAGNOSIS — G500 Trigeminal neuralgia: Secondary | ICD-10-CM | POA: Diagnosis not present

## 2022-11-07 DIAGNOSIS — Z993 Dependence on wheelchair: Secondary | ICD-10-CM

## 2022-11-07 DIAGNOSIS — L89326 Pressure-induced deep tissue damage of left buttock: Secondary | ICD-10-CM | POA: Diagnosis present

## 2022-11-07 DIAGNOSIS — L89316 Pressure-induced deep tissue damage of right buttock: Secondary | ICD-10-CM | POA: Diagnosis present

## 2022-11-07 DIAGNOSIS — Z820 Family history of epilepsy and other diseases of the nervous system: Secondary | ICD-10-CM

## 2022-11-07 DIAGNOSIS — R609 Edema, unspecified: Secondary | ICD-10-CM

## 2022-11-07 LAB — CBC WITH DIFFERENTIAL/PLATELET
Abs Immature Granulocytes: 0.02 10*3/uL (ref 0.00–0.07)
Basophils Absolute: 0 10*3/uL (ref 0.0–0.1)
Basophils Relative: 1 %
Eosinophils Absolute: 0 10*3/uL (ref 0.0–0.5)
Eosinophils Relative: 0 %
HCT: 30.9 % — ABNORMAL LOW (ref 39.0–52.0)
Hemoglobin: 9.7 g/dL — ABNORMAL LOW (ref 13.0–17.0)
Immature Granulocytes: 0 %
Lymphocytes Relative: 10 %
Lymphs Abs: 0.5 10*3/uL — ABNORMAL LOW (ref 0.7–4.0)
MCH: 31.3 pg (ref 26.0–34.0)
MCHC: 31.4 g/dL (ref 30.0–36.0)
MCV: 99.7 fL (ref 80.0–100.0)
Monocytes Absolute: 0.4 10*3/uL (ref 0.1–1.0)
Monocytes Relative: 8 %
Neutro Abs: 3.9 10*3/uL (ref 1.7–7.7)
Neutrophils Relative %: 81 %
Platelets: 172 10*3/uL (ref 150–400)
RBC: 3.1 MIL/uL — ABNORMAL LOW (ref 4.22–5.81)
RDW: 17 % — ABNORMAL HIGH (ref 11.5–15.5)
WBC: 4.8 10*3/uL (ref 4.0–10.5)
nRBC: 0 % (ref 0.0–0.2)

## 2022-11-07 LAB — BASIC METABOLIC PANEL
Anion gap: 12 (ref 5–15)
BUN: 18 mg/dL (ref 6–20)
CO2: 28 mmol/L (ref 22–32)
Calcium: 8.5 mg/dL — ABNORMAL LOW (ref 8.9–10.3)
Chloride: 104 mmol/L (ref 98–111)
Creatinine, Ser: 0.85 mg/dL (ref 0.61–1.24)
GFR, Estimated: >60 mL/min (ref >60)
Glucose, Bld: 86 mg/dL (ref 70–99)
Potassium: 2.9 mmol/L — ABNORMAL LOW (ref 3.5–5.1)
Sodium: 144 mmol/L (ref 135–145)

## 2022-11-07 LAB — MAGNESIUM: Magnesium: 1.9 mg/dL (ref 1.7–2.4)

## 2022-11-07 MED ORDER — LIDOCAINE HCL (PF) 2 % IJ SOLN
Freq: Once | INTRAVENOUS | Status: DC
  Filled 2022-11-07: qty 120

## 2022-11-07 MED ORDER — ENOXAPARIN SODIUM 40 MG/0.4ML IJ SOSY
40.0000 mg | PREFILLED_SYRINGE | INTRAMUSCULAR | Status: DC
  Administered 2022-11-07 – 2022-11-10 (×4): 40 mg via SUBCUTANEOUS
  Filled 2022-11-07 (×4): qty 0.4

## 2022-11-07 MED ORDER — SODIUM CHLORIDE 0.9 % IV SOLN: INTRAVENOUS | Status: AC

## 2022-11-07 MED ORDER — LIDOCAINE BOLUS VIA INFUSION: 5.0000 mg/kg | Freq: Once | INTRAVENOUS | Status: DC

## 2022-11-07 MED ORDER — LAMOTRIGINE 25 MG PO TABS
50.0000 mg | ORAL_TABLET | Freq: Every day | ORAL | Status: DC
  Administered 2022-11-07: 50 mg via ORAL
  Filled 2022-11-07 (×2): qty 2

## 2022-11-07 MED ORDER — LIDOCAINE HCL (PF) 2 % IJ SOLN
Freq: Once | INTRAVENOUS | Status: AC
  Filled 2022-11-07: qty 81

## 2022-11-07 MED ORDER — SODIUM CHLORIDE 0.9 % IV SOLN: INTRAVENOUS | Status: DC

## 2022-11-07 MED ORDER — HYDROMORPHONE HCL 1 MG/ML IJ SOLN
1.0000 mg | Freq: Once | INTRAMUSCULAR | Status: AC
  Administered 2022-11-07: 1 mg via INTRAVENOUS
  Filled 2022-11-07: qty 1

## 2022-11-07 MED ORDER — LIDOCAINE IN D5W 4-5 MG/ML-% IV SOLN: 4.0000 mg/min | INTRAVENOUS | Status: DC

## 2022-11-07 MED ORDER — SODIUM CHLORIDE 0.9 % IV BOLUS
1000.0000 mL | Freq: Once | INTRAVENOUS | Status: AC
  Administered 2022-11-07: 1000 mL via INTRAVENOUS

## 2022-11-07 MED ORDER — FENTANYL CITRATE PF 50 MCG/ML IJ SOSY
50.0000 ug | PREFILLED_SYRINGE | Freq: Once | INTRAMUSCULAR | Status: AC
  Administered 2022-11-07: 50 ug via INTRAVENOUS
  Filled 2022-11-07: qty 1

## 2022-11-07 MED ORDER — OXYCODONE-ACETAMINOPHEN 5-325 MG PO TABS
1.0000 | ORAL_TABLET | Freq: Four times a day (QID) | ORAL | Status: DC | PRN
  Administered 2022-11-08 – 2022-11-12 (×17): 1 via ORAL
  Filled 2022-11-07 (×18): qty 1

## 2022-11-07 MED ORDER — LIDOCAINE HCL (PF) 2 % IJ SOLN: Freq: Once | INTRAVENOUS | Status: DC

## 2022-11-07 MED ORDER — POTASSIUM CHLORIDE 10 MEQ/100ML IV SOLN
10.0000 meq | INTRAVENOUS | Status: AC
  Administered 2022-11-07 (×5): 10 meq via INTRAVENOUS
  Filled 2022-11-07 (×5): qty 100

## 2022-11-07 NOTE — ED Notes (Signed)
Patient wife requested for pain medication for patient. MD made aware via secure chat. Will continue to monitor

## 2022-11-07 NOTE — ED Triage Notes (Signed)
Pt BIB [facility] from home c/o trigeminal neuropathy to the right side of the face that has been ongoing for 3 days and therefore pt has not been able to eat. Pt has MS.

## 2022-11-07 NOTE — ED Notes (Signed)
Family at bedside. Pt given chapstick

## 2022-11-07 NOTE — ED Notes (Signed)
MD at bedside. 

## 2022-11-07 NOTE — ED Notes (Signed)
Family updated as to patient's status. Family at bedside.  

## 2022-11-07 NOTE — Progress Notes (Signed)
SLP Cancellation Note  Patient Details Name: [name] MRN: [id number] DOB: 31-Jul-1961   Cancelled treatment:       Reason Eval/Treat Not Completed: Other (comment) (Patient's spouse requesting no PO's at this time as his pain from [place] has just recently settled down and she doesnt want anything to "set it off".)  Of note, patient was recently admitted 1/8-1/13/24 for similar symptoms and at that time was seen by SLP for swallow function. At that time, SLP's clinical judgement was that patient's swallowing mechanics were likely [facility] and the prohibiting component was his odynophagia.   SLP will f/u as able, however recommendation is for patient to have order for regular solids, thin liquids so that he and wife can order PO's he would tolerate.   Sonia Baller, MA, CCC-SLP Speech Therapy

## 2022-11-07 NOTE — ED Notes (Signed)
Pt resting comfortably

## 2022-11-07 NOTE — ED Provider Notes (Signed)
[facility] Provider Note   CSN: [id number] Arrival date & time: 11/07/22  0703     History  Chief Complaint  Patient presents with   trigeminal neuropathy    [name] is a 62 y.o. male, history of MS and trigeminal neuralgia, presented with 3d of trigeminal neuralgia. Patient was recently seen and dc 10/24/22 for TN and given lamotrigine and gabapentin for TN management but reported symptoms have persisted and due to pain has not taken his meds/food/fluids in 3 days. Patient in the past has tried oxcarbazepine, nerve blocks, and baclofen to no relief. Patient stated IV lidocaine gave his some relief when he was inpatient. Patient stated he feels numb all over but that this is baseline.  Patient denied chest pain, shortness of breath, abd pain, N/V/D, fevers, sick contacts, new weakness.  Home Medications Prior to Admission medications   Medication Sig Start Date End Date Taking? Authorizing Provider  acetaminophen (TYLENOL) 325 MG tablet Take 2 tablets (650 mg total) by mouth every 6 (six) hours as needed for mild pain, fever or moderate pain (or Fever >/= 101). 03/12/22   Little Ishikawa, MD  albuterol (VENTOLIN HFA) 108 (90 Base) MCG/ACT inhaler Inhale 2 puffs into the lungs every 6 (six) hours as needed. Patient taking differently: Inhale 1-2 puffs into the lungs every 6 (six) hours as needed for wheezing. 07/24/21   Kathrynn Ducking, MD  Carboxymethylcellulose Sod PF (THERATEARS) 1 % GEL Place 1 drop into both eyes daily as needed (dry eyes).    [provider]  Dextromethorphan-Quinidine 20-10 MG CAPS Take 1 tablet by mouth 2 (two) times daily. 05/27/17   Kathrynn Ducking, MD  escitalopram (LEXAPRO) 10 MG tablet Take 10 mg by mouth daily.    [provider]  ibuprofen (ADVIL) 200 MG tablet Take 600 mg by mouth every 6 (six) hours as needed for headache or moderate pain.    [provider]  lamoTRIgine  (LAMICTAL) 100 MG tablet Take 1 tablet (100 mg total) by mouth daily for 7 days. 11/18/22 11/25/22  Mapp, Claudia Desanctis, MD  lamoTRIgine (LAMICTAL) 25 MG tablet Take 2 tablets (50 mg total) by mouth daily for 14 days. 11/04/22 11/18/22  Mapp, Claudia Desanctis, MD  lamoTRIgine (LAMICTAL) 25 MG tablet Take 1 tablet (25 mg total) by mouth daily for 10 days. 10/25/22 11/04/22  Mapp, Claudia Desanctis, MD  leptospermum manuka honey (MEDIHONEY) PSTE paste Apply 1 Application topically daily. 10/24/22   Mapp, Claudia Desanctis, MD  modafinil (PROVIGIL) 100 MG tablet TAKE 1 TABLET (100 MG TOTAL) BY MOUTH IN THE MORNING. *SENT FOR PA* Patient taking differently: Take 100 mg by mouth daily. 11/29/20   Kathrynn Ducking, MD  ocrelizumab 600 mg in sodium chloride 0.9 % 500 mL Inject 600 mg into the vein every 6 (six) months.     [provider]  ondansetron (ZOFRAN-ODT) 4 MG disintegrating tablet 4mg  ODT q4 hours prn nausea/vomit Patient not taking: Reported on 08/19/2022 08/16/22   Elnora Morrison, MD  oxyCODONE-acetaminophen (PERCOCET) 5-325 MG tablet Take 1 tablet by mouth every 6 (six) hours as needed for severe pain. 09/09/22 09/09/23  Sater, Nanine Means, MD  potassium chloride SA (KLOR-CON M) 20 MEQ tablet Take 2 tablets (40 mEq total) by mouth daily. 10/24/22 11/23/22  Mapp, Claudia Desanctis, MD  pregabalin (LYRICA) 75 MG capsule Take 1 capsule (75 mg total) by mouth 3 (three) times daily. 10/24/22 11/23/22  Starlyn Skeans, MD  Allergies    Gadopiclenol    Review of Systems   Review of Systems See HPI Physical Exam Updated Vital Signs BP 103/66 (BP Location: Right Arm)   Pulse 73   Temp 97.6 F (36.4 C) (Oral)   Resp 12   SpO2 97%  Physical Exam Vitals and nursing note reviewed.  Constitutional:      General: He is not in acute distress.    Appearance: He is well-developed.  HENT:     Head: Normocephalic and atraumatic.     Ears:     Comments: No vesicles noted Eyes:     Extraocular Movements: Extraocular movements intact.      Conjunctiva/sclera: Conjunctivae normal.     Pupils: Pupils are equal, round, and reactive to light.  Neck:     Comments: Limited neck AROM but baseline Able to swallow Cardiovascular:     Rate and Rhythm: Normal rate and regular rhythm.     Pulses: Normal pulses.     Heart sounds: Normal heart sounds. No murmur heard.    Comments: 2+ bilateral radial/DP/PT pulses Pulmonary:     Effort: Pulmonary effort is normal. No respiratory distress.     Breath sounds: Normal breath sounds.  Abdominal:     General: Abdomen is flat.     Palpations: Abdomen is soft.     Tenderness: There is no abdominal tenderness.  Musculoskeletal:        General: No swelling or tenderness.  Skin:    General: Skin is warm and dry.     Capillary Refill: Capillary refill takes less than 2 seconds.  Neurological:     Mental Status: He is alert. Mental status is at baseline.     Sensory: Sensation is intact.     Comments: R sided weakness and bilateral lower leg weakness that is baseline 5/5 grip strength and full AROM in R upper limb Sensation intact in all 4 limbs Able to blink bilaterally  Psychiatric:        Mood and Affect: Mood normal.     ED Results / Procedures / Treatments   Labs (all labs ordered are listed, but only abnormal results are displayed) Labs Reviewed  CBC WITH DIFFERENTIAL/PLATELET - Abnormal; Notable for the following components:      Result Value   RBC 3.10 (*)    Hemoglobin 9.7 (*)    HCT 30.9 (*)    RDW 17.0 (*)    Lymphs Abs 0.5 (*)    All other components within normal limits  BASIC METABOLIC PANEL - Abnormal; Notable for the following components:   Potassium 2.9 (*)    Calcium 8.5 (*)    All other components within normal limits  MAGNESIUM    EKG EKG Interpretation  Date/Time:  Saturday November 07 2022 08:15:16 EST Ventricular Rate:  73 PR Interval:  49 QRS Duration: 94 QT Interval:  379 QTC Calculation: 418 R Axis:   7 Text Interpretation: Sinus rhythm Short  PR interval Low voltage, precordial leads No acute changes No significant change since last tracing Confirmed by Varney Biles [id number]) on 11/07/2022 8:47:41 AM  Radiology No results found.  Procedures .Critical Care  Performed by: Chuck Hint, PA-C Authorized by: Chuck Hint, PA-C   Critical care provider statement:    Critical care time (minutes):  60   Critical care time was exclusive of:  Separately billable procedures and treating other patients   Critical care was necessary to treat or prevent imminent or life-threatening deterioration  of the following conditions: Intractable pain from trigeminal neuralgia.   Critical care was time spent personally by me on the following activities:  Discussions with consultants, evaluation of patient's response to treatment, examination of patient, obtaining history from patient or surrogate, review of old charts, re-evaluation of patient's condition, pulse oximetry, ordering and review of radiographic studies, ordering and review of laboratory studies and ordering and performing treatments and interventions   I assumed direction of critical care for this patient from another provider in my specialty: yes     Care discussed with: admitting provider       Medications Ordered in ED Medications  Lidocaine (Xylocaine) 4mg /mL bolus (480mg  in 120 mL D5) (has no administration in time range)  potassium chloride 10 mEq in 100 mL IVPB (has no administration in time range)  sodium chloride 0.9 % bolus 1,000 mL (1,000 mLs Intravenous New Bag/Given 11/07/22 0836)  fentaNYL (SUBLIMAZE) injection 50 mcg (50 mcg Intravenous Given 11/07/22 0847)  HYDROmorphone (DILAUDID) injection 1 mg (1 mg Intravenous Given 11/07/22 1005)    ED Course/ Medical Decision Making/ A&P                             Medical Decision Making Amount and/or Complexity of Data Reviewed Labs: ordered.  Risk Prescription drug management. Decision regarding  hospitalization.   Lahoma Crocker 62 y.o. presented today for TN exacerbation. Working DDx that I considered at this time includes, but not limited to, TN exacerbation, Bell Palsy.  Review of prior external notes: 10/24/22 Discharge Summary  Unique Tests and My Interpretation:  BMP:  CBC w diff: EKG:  Discussion with Independent Historian: none  Discussion of Management of Tests: Neurologist: Bruce Donath, MD Hospitalist: Hadassah Pais, PGY-3  Risk: High:  - hospitalization or escalation of hospital-level care  Risk Stratification Score: none  Staffed with Kathrynn Humble, MD  R/o DDx: Bell's palsy: Patient is able to wink bilaterally and no vesicles noted on the tympanic membrane  Plan: Patient presented with what is most likely a TN exacerbation. Due to patient's presentation, being unable to tolerate PO, and long history of TN refractory to meds, patient will benefit of hospital admission for IV lidocaine infusion as this has helped in the past. Basic labs were ordered to evaluate for electrolyte abnormalities or arrhythmias as patient has not eaten in 3 days and fluids were ordered as BP is slightly low at 103/66. Rest of patient's vitals are stable for admission.  After speaking with the neurologist we decided it was best to continue with the IV lidocaine infusion as patient reported responding well to it in the past.  Patient wife were informed of the decision and had decision making capacity to except lidocaine and so a lidocaine infusion was ordered as per neurology's request.  Patient was also given fentanyl for pain management at this time.  Patient's wife stated the fentanyl patient was given earlier was not managing the pain and so Dilaudid was ordered.  Patient was also noted to be hypokalemic at 2.9 and so potassium was ordered along with a magnesium to evaluate her magnesium levels.  Due to patient's overall presentation and past medical history we feel inpatient stay is best  and hospitalist was consulted.  Hospitalist accepted patient.  Patient's vitals are stable for admission at this time.        Final Clinical Impression(s) / ED Diagnoses Final diagnoses:  Trigeminal neuralgia    Rx /  DC Orders ED Discharge Orders     None         Remi Deter 11/07/22 1054    Derwood Kaplan, MD 11/08/22 1535

## 2022-11-07 NOTE — ED Notes (Signed)
Attempted PIV placement x2 without success

## 2022-11-07 NOTE — ED Notes (Signed)
Lab called will add on mag

## 2022-11-07 NOTE — ED Notes (Signed)
ED TO INPATIENT HANDOFF REPORT  ED Nurse Name and Phone #:   S Name/Age/Gender [name] 62 y.o. male Room/Bed: 014C/014C  Code Status   Code Status: Full Code  Home/SNF/Other Home Patient oriented to: self, place, time, and situation Is this baseline? Yes   Triage Complete: Triage complete  Chief Complaint Trigeminal neuralgia pain [G50.0]  Triage Note Pt BIB [facility] from home c/o trigeminal neuropathy to the right side of the face that has been ongoing for 3 days and therefore pt has not been able to eat. Pt has MS.    Allergies Allergies  Allergen Reactions   Gadopiclenol Nausea And Vomiting    Level of Care/Admitting Diagnosis ED Disposition     ED Disposition  Admit   Condition  --   Comment  Hospital Area: [facility] [[id number]]  Level of Care: Telemetry Medical [104]  May place patient in observation at [facility] or [place] if equivalent level of care is available:: No  Covid Evaluation: Asymptomatic - no recent exposure (last 10 days) testing not required  Diagnosis: Trigeminal neuralgia pain [448185]  Admitting Physician: Campbell Riches [[id number]]  Attending Physician: Johnnye Sima, JEFFREY C [[id number]]          B Medical/Surgery History Past Medical History:  Diagnosis Date   Abnormality of gait 11/21/2015   Asthma    childhood asthma   Classic migraine    Depression    Dysphagia    Hay fever    Headache syndrome 12/22/2018   MS (multiple sclerosis) ([place])    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   Trigeminal neuralgia of right side of face    Past Surgical History:  Procedure Laterality Date   eye surgeries     x 2; bilateral 74 and 76     A IV Location/Drains/Wounds Patient Lines/Drains/Airways Status     Active Line/Drains/Airways     Name Placement date Placement time Site Days   Peripheral IV 11/07/22 20 G Left Antecubital 11/07/22  0834  Antecubital  less than 1   Peripheral IV 11/07/22 20 G  Anterior;Left;Proximal Forearm 11/07/22  0838  Forearm  less than 1   Peripheral IV 11/07/22 20 G 1.88" Right;Anterior Forearm 11/07/22  1501  Forearm  less than 1   Urethral Catheter Whitney, RN Double-lumen 14 Fr. 10/19/22  1630  Double-lumen  19   Pressure Injury 08/20/22 Ischial tuberosity Right Deep Tissue Pressure Injury - Purple or maroon localized area of discolored intact skin or blood-filled blister due to damage of underlying soft tissue from pressure and/or shear. 08/20/22  1412  -- 79   Pressure Injury 10/19/22 Ischial tuberosity Left Deep Tissue Pressure Injury - Purple or maroon localized area of discolored intact skin or blood-filled blister due to damage of underlying soft tissue from pressure and/or shear. 10/19/22  2250  -- 19   Pressure Injury 10/19/22 Vertebral column Medial Unstageable - Full thickness tissue loss in which the base of the injury is covered by slough (yellow, tan, gray, green or brown) and/or eschar (tan, brown or black) in the wound bed. 10/19/22  2250  -- 19   Pressure Injury 10/19/22 Heel Left Stage 1 -  Intact skin with non-blanchable redness of a localized area usually over a bony prominence. 10/19/22  2250  -- 19   Wound / Incision (Open or Dehisced) 03/11/22 (ITD) Intertriginous Dermatitis Pretibial Distal;Left;Right 03/11/22  2000  Pretibial  241            Intake/Output  Last 24 hours  Intake/Output Summary (Last 24 hours) at 11/07/2022 1524 Last data filed at 11/07/2022 1206 Gross per 24 hour  Intake 1100 ml  Output --  Net 1100 ml    Labs/Imaging Results for orders placed or performed during the hospital encounter of 11/07/22 (from the past 48 hour(s))  CBC with Differential     Status: Abnormal   Collection Time: 11/07/22  7:34 AM  Result Value Ref Range   WBC 4.8 4.0 - 10.5 K/uL   RBC 3.10 (L) 4.22 - 5.81 MIL/uL   Hemoglobin 9.7 (L) 13.0 - 17.0 g/dL   HCT 30.9 (L) 39.0 - 52.0 %   MCV 99.7 80.0 - 100.0 fL   MCH 31.3 26.0 - 34.0 pg    MCHC 31.4 30.0 - 36.0 g/dL   RDW 17.0 (H) 11.5 - 15.5 %   Platelets 172 150 - 400 K/uL   nRBC 0.0 0.0 - 0.2 %   Neutrophils Relative % 81 %   Neutro Abs 3.9 1.7 - 7.7 K/uL   Lymphocytes Relative 10 %   Lymphs Abs 0.5 (L) 0.7 - 4.0 K/uL   Monocytes Relative 8 %   Monocytes Absolute 0.4 0.1 - 1.0 K/uL   Eosinophils Relative 0 %   Eosinophils Absolute 0.0 0.0 - 0.5 K/uL   Basophils Relative 1 %   Basophils Absolute 0.0 0.0 - 0.1 K/uL   Immature Granulocytes 0 %   Abs Immature Granulocytes 0.02 0.00 - 0.07 K/uL    Comment: Performed at [facility], [address], [place], [place] [address]  Basic metabolic panel     Status: Abnormal   Collection Time: 11/07/22  7:34 AM  Result Value Ref Range   Sodium 144 135 - 145 mmol/L   Potassium 2.9 (L) 3.5 - 5.1 mmol/L   Chloride 104 98 - 111 mmol/L   CO2 28 22 - 32 mmol/L   Glucose, Bld 86 70 - 99 mg/dL    Comment: Glucose reference range applies only to samples taken after fasting for at least 8 hours.   BUN 18 6 - 20 mg/dL   Creatinine, Ser 0.85 0.61 - 1.24 mg/dL   Calcium 8.5 (L) 8.9 - 10.3 mg/dL   GFR, Estimated >60 >60 mL/min    Comment: (NOTE) Calculated using the CKD-EPI Creatinine Equation (2021)    Anion gap 12 5 - 15    Comment: Performed at [facility] [address], [place], [place] [address]   No results found.  Pending Labs Unresulted Labs (From admission, onward)     Start     Ordered   11/07/22 0924  Magnesium  Once,   STAT        11/07/22 0923            Vitals/Pain Today's Vitals   11/07/22 1130 11/07/22 1200 11/07/22 1230 11/07/22 1300  BP: 105/70 105/67 104/67   Pulse: 74 73 66   Resp: 10 11 (!) 9   Temp:    97.7 F (36.5 C)  TempSrc:    Oral  SpO2: 97% 98% 98%   Weight:        Isolation Precautions No active isolations  Medications Medications  potassium chloride 10 mEq in 100 mL IVPB (10 mEq Intravenous New Bag/Given 11/07/22 1513)  enoxaparin (LOVENOX) injection 40 mg (40  mg Subcutaneous Given 11/07/22 1511)  lamoTRIgine (LAMICTAL) tablet 50 mg (50 mg Oral Given 11/07/22 1514)  0.9 %  sodium chloride infusion (  Intravenous New Bag/Given 11/07/22 1512)  sodium chloride 0.9 % bolus 1,000 mL (0 mLs Intravenous Stopped 11/07/22 1023)  fentaNYL (SUBLIMAZE) injection 50 mcg (50 mcg Intravenous Given 11/07/22 0847)  HYDROmorphone (DILAUDID) injection 1 mg (1 mg Intravenous Given 11/07/22 1005)  dextrose 5 % 81 mL with lidocaine HCl (PF) (XYLOCAINE) 2 % 380 mg infusion ( Intravenous New Bag/Given 11/07/22 1505)    Mobility non-ambulatory     Focused Assessments Neuro Assessment Handoff:  Swallow screen pass?  Cardiac Rhythm: Normal sinus rhythm       Neuro Assessment: Exceptions to [facility] Neuro Checks:      Has TPA been given? No If patient is a Neuro Trauma and patient is going to OR before floor call report to 4N Charge nurse: [phone] or [phone]   R Recommendations: See Admitting Provider Note  Report given to:   Additional Notes:

## 2022-11-07 NOTE — ED Notes (Signed)
Patient family does not want any sticks in right arm, only in left arm per wife. Wife is requesting IV team to any additional IV insertions.IV team consulted. Will continue to monitor

## 2022-11-07 NOTE — ED Notes (Signed)
Darnelle Maffucci RN asked to look for PIV site

## 2022-11-07 NOTE — H&P (Addendum)
NAME:  [name], MRN:  [id number], DOB:  [birth year], LOS: 0 ADMISSION DATE:  11/07/2022, Primary: Pcp, No  CHIEF COMPLAINT:  facial pain   Medical Service: Internal Medicine Teaching Service         Attending Physician: Dr. Campbell Riches, MD    First Contact: Dr. Carin Primrose Pager: [phone]  Second Contact: Dr. Raymondo Band Pager: [phone]       After Hours (After 5p/  First Contact Pager: [phone]  weekends / holidays): Second Contact Pager: [phone]   HISTORY OF PRESENT ILLNESS   [name] is 62yo person with multiple sclerosis complicated by quadriplegia and chronic Foley catheter, trigeminal neuralgia, depression presenting to [facility] for facial pain. Majority of history taken from wife given patient's significant pain. For the past few days patient has been unable to eat or drink due to the pain. Today he told his wife he would rather shoot himself than continue in this pain. This has happened previously, most recently admitted 1/8-1/13 to IMTS for similar symptoms.  Patient has not had any infectious symptoms, including dyspnea, cough, diarrhea, fever, chills. He has tried numerous different modalities for the trigeminal neuralgic pain, including oxcarbazepine, nerve blocks, baclofen without adequate relief. During last admission patient was given IV infusion of lidocaine, which helped his pain tremendously.   PCP: Pcp, No  ED COURSE   Patient afebrile, hemodynamically stable sating well on room air. Lab work revealed hypokalemia 2.9 otherwise no significant changes from previous. IMTS consulted for admission for IV lidocaine infusion.   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   HOME MEDICATIONS   Prior to Admission  medications   Medication Sig Start Date End Date Taking? Authorizing Provider  acetaminophen (TYLENOL) 325 MG tablet Take 2 tablets (650 mg total) by mouth every 6 (six) hours as needed for mild pain, fever or moderate pain (or Fever >/= 101). 03/12/22   Little Ishikawa, MD  albuterol (VENTOLIN HFA) 108 (90 Base) MCG/ACT inhaler Inhale 2 puffs into the lungs every 6 (six) hours as needed. Patient taking differently: Inhale 1-2 puffs into the lungs every 6 (six) hours as needed for wheezing. 07/24/21   Kathrynn Ducking, MD  Carboxymethylcellulose Sod PF (THERATEARS) 1 % GEL Place 1 drop into both eyes daily as needed (dry eyes).    [provider]  Dextromethorphan-Quinidine 20-10 MG CAPS Take 1 tablet by mouth 2 (two) times daily. 05/27/17   Kathrynn Ducking, MD  escitalopram (LEXAPRO) 10 MG tablet Take 10 mg by mouth daily.    [provider]  ibuprofen (ADVIL) 200 MG tablet Take 600 mg by mouth every 6 (six) hours as needed for headache or moderate pain.    [provider]  lamoTRIgine (LAMICTAL) 100 MG tablet Take 1 tablet (100 mg total) by mouth daily for 7 days. 11/18/22 11/25/22  Mapp, Claudia Desanctis, MD  lamoTRIgine (LAMICTAL) 25 MG tablet Take 2 tablets (50 mg total) by mouth daily for 14 days. 11/04/22 11/18/22  Mapp, Claudia Desanctis, MD  lamoTRIgine (LAMICTAL) 25 MG tablet Take 1 tablet (25 mg total) by mouth daily for 10 days. 10/25/22 11/04/22  Mapp, Claudia Desanctis, MD  leptospermum manuka honey (MEDIHONEY) PSTE paste Apply 1 Application topically daily. 10/24/22   Mapp, Claudia Desanctis, MD  modafinil (PROVIGIL) 100 MG tablet TAKE  1 TABLET (100 MG TOTAL) BY MOUTH IN THE MORNING. *SENT FOR PA* Patient taking differently: Take 100 mg by mouth daily. 11/29/20   York Spaniel, MD  ocrelizumab 600 mg in sodium chloride 0.9 % 500 mL Inject 600 mg into the vein every 6 (six) months.     [provider]  ondansetron (ZOFRAN-ODT) 4 MG disintegrating tablet 4mg  ODT q4 hours prn  nausea/vomit Patient not taking: Reported on 08/19/2022 08/16/22   13/5/23, MD  oxyCODONE-acetaminophen (PERCOCET) 5-325 MG tablet Take 1 tablet by mouth every 6 (six) hours as needed for severe pain. 09/09/22 09/09/23  Sater, 09/11/23, MD  potassium chloride SA (KLOR-CON M) 20 MEQ tablet Take 2 tablets (40 mEq total) by mouth daily. 10/24/22 11/23/22  Mapp, 01/22/23, MD  pregabalin (LYRICA) 75 MG capsule Take 1 capsule (75 mg total) by mouth 3 (three) times daily. 10/24/22 11/23/22  01/22/23, MD    ALLERGIES   Allergies as of 11/07/2022 - Unable to Assess 11/07/2022  Allergen Reaction Noted   Gadopiclenol Nausea And Vomiting 07/21/2022    SOCIAL HISTORY   Patient lives at home with his wife, has been wheelchair bound since 2009, requiring life for transfer since 2021. Able to feed himself with left hand. Goes to 2022 for PCP, [facility] for neurologist.   FAMILY HISTORY   His family history includes Cancer in his father; Lung cancer in an other family member; Multiple sclerosis in his paternal uncle, sister, sister, and another family member; Parkinsonism in his maternal uncle; Seizures in his maternal uncle; Uterine cancer in an other family member.   REVIEW OF SYSTEMS   ROS per history of present illness.  PHYSICAL EXAMINATION   Blood pressure 125/75, pulse 81, temperature 97.6 F (36.4 C), temperature source Oral, resp. rate 14, weight 76.2 kg, SpO2 97 %.    Filed Weights   11/07/22 1052  Weight: 76.2 kg   GENERAL: Chronically ill-appearing person laying comfortably in bed. HENT: Normocephalic, atraumatic. Dry mucous membranes. EYES: Vision grossly in tact. No conjunctival injection or scleral icterus.  CV: Regular rate, rhythm. No murmurs appreciated.  PULM: Normal work of breathing on room air. Clear to auscultation bilaterally from anterior lung fields.  GI: Abdomen soft, non-tender, non-distended. No rebound tenderness or guarding. Normoactive bowel sounds. GU:  Foley in  place. MSK: Decreased muscle mass throughout. No peripheral edema noted. SKIN: Warm, dry. No rashes or lesions NEURO: Somnolent, but arousable. Speaks 1-2 words. Grossly non-focal  PSYCH: Flat affect.   SIGNIFICANT DIAGNOSTIC TESTS   ECG: Normal sinus rhythm, normal axis.  I personally reviewed patient's ECG with my interpretation as above.  LABS      Latest Ref Rng & Units 11/07/2022    7:34 AM 10/23/2022    5:58 AM 10/22/2022    7:26 AM  CBC  WBC 4.0 - 10.5 K/uL 4.8  7.0  9.1   Hemoglobin 13.0 - 17.0 g/dL 9.7  12/21/2022  20.2   Hematocrit 39.0 - 52.0 % 30.9  32.0  34.3   Platelets 150 - 400 K/uL 172  156  152       Latest Ref Rng & Units 11/07/2022    7:34 AM 10/24/2022    1:21 PM 10/24/2022    1:36 AM  BMP  Glucose 70 - 99 mg/dL 86  10/26/2022  706   BUN 6 - 20 mg/dL 18  8  9    Creatinine 0.61 - 1.24 mg/dL 237   6.28  Sodium 135 - 145 mmol/L 144  139  138   Potassium 3.5 - 5.1 mmol/L 2.9  3.5  3.3   Chloride 98 - 111 mmol/L 104  106  102   CO2 22 - 32 mmol/L 28  24  26    Calcium 8.9 - 10.3 mg/dL 8.5  7.9  8.0     CONSULTS   N/a  ASSESSMENT   dam Campillo is 62yo person with multiple sclerosis complicated by quadriplegia and chronic Foley catheter, trigeminal neuralgia, depression admitted 1/27 for trigeminal neuralgia pain and IV lidocaine infusion.   PLAN   Principal Problem:   Trigeminal neuralgia pain  #Trigeminal neuralgia Neurology initially consulted by EDP for assistance with trigeminal neuralgia pain, however he has exhausted his treatment options, only IV lidocaine has given him adequate relief of pain. Per my discussion with he and his wife today, he is not interested in feeding tube if he is unable to tolerate PO. In addition, he is interested in continued full treatment and is a PARTIAL CODE, as he would NOT like to be intubated. For now we will continue with IV lidocaine infusion, monitor on telemetry and have him follow-up with outpatient neurologist. Per  last neurology note, would likely have benefit from lamotrigine, although this could take significant time to take effect. Will hold home Lyrica and continue Lamictal.  - IV lidocaine infusion - Continue home lamotrigine 50mg  daily - Telemetry - Can give IV dilaudid 1mg  for breakthrough pain - Follow-up with outpatient neurology - Hold home pregabalin - NPO for now, will have SLP evaluate - Maintenance fluids while NPO today  #Hypokalemia K 2.9 on arrival most likely due to decreased PO intake 2/2 pain. Repleting w/ IV, checking Mg as well.  - IV potassium x6 - Repeat BMP in AM - Follow-up Mg  #Multiple sclerosis #Spastic quadriplegia #Chronic indwelling Foley Patient had Ocrevus infusion this past week. No new symptoms, appears to be stable. Last Foley change was 10/19/22 per wife.  - Follow-up SLP eval  #Multiple gluteal fold pressure wounds Patient deferred exam today given his severe pain and somnolence. Will place wound consult and have day team evaluate in AM. - WOC consult  #Depression Patient does not appear to have suicidal ideations now, I believe his statements earlier were more reflective of his acute pain. He wants to continue with full treatment.  - Hold home Lexapro  BEST PRACTICE   DIET: Regular IVF: NS 150/h x10h DVT PPX: lovenox BOWEL: n/a CODE: PARTIAL - DO NOT INTUBATE FAM COM: discussed with wife at bedside  DISPO: Admit patient to Observation with expected length of stay less than 2 midnights.  Sanjuan Dame, MD Internal Medicine Resident PGY-3 Pager [phone] 11/07/22 11:05 AM

## 2022-11-08 ENCOUNTER — Encounter (HOSPITAL_COMMUNITY): Payer: Self-pay | Admitting: Infectious Diseases

## 2022-11-08 DIAGNOSIS — Z515 Encounter for palliative care: Secondary | ICD-10-CM | POA: Diagnosis not present

## 2022-11-08 DIAGNOSIS — Z993 Dependence on wheelchair: Secondary | ICD-10-CM | POA: Diagnosis not present

## 2022-11-08 DIAGNOSIS — F32A Depression, unspecified: Secondary | ICD-10-CM | POA: Diagnosis present

## 2022-11-08 DIAGNOSIS — G35 Multiple sclerosis: Secondary | ICD-10-CM | POA: Diagnosis present

## 2022-11-08 DIAGNOSIS — N5089 Other specified disorders of the male genital organs: Secondary | ICD-10-CM | POA: Diagnosis present

## 2022-11-08 DIAGNOSIS — E8809 Other disorders of plasma-protein metabolism, not elsewhere classified: Secondary | ICD-10-CM | POA: Diagnosis present

## 2022-11-08 DIAGNOSIS — E43 Unspecified severe protein-calorie malnutrition: Secondary | ICD-10-CM | POA: Diagnosis present

## 2022-11-08 DIAGNOSIS — R000 Tachycardia, unspecified: Secondary | ICD-10-CM | POA: Diagnosis not present

## 2022-11-08 DIAGNOSIS — R339 Retention of urine, unspecified: Secondary | ICD-10-CM | POA: Diagnosis present

## 2022-11-08 DIAGNOSIS — M62838 Other muscle spasm: Secondary | ICD-10-CM | POA: Diagnosis present

## 2022-11-08 DIAGNOSIS — L89326 Pressure-induced deep tissue damage of left buttock: Secondary | ICD-10-CM | POA: Diagnosis present

## 2022-11-08 DIAGNOSIS — Z888 Allergy status to other drugs, medicaments and biological substances status: Secondary | ICD-10-CM | POA: Diagnosis not present

## 2022-11-08 DIAGNOSIS — L89316 Pressure-induced deep tissue damage of right buttock: Secondary | ICD-10-CM | POA: Diagnosis present

## 2022-11-08 DIAGNOSIS — B9729 Other coronavirus as the cause of diseases classified elsewhere: Secondary | ICD-10-CM | POA: Diagnosis present

## 2022-11-08 DIAGNOSIS — G500 Trigeminal neuralgia: Secondary | ICD-10-CM | POA: Diagnosis not present

## 2022-11-08 DIAGNOSIS — Z87891 Personal history of nicotine dependence: Secondary | ICD-10-CM | POA: Diagnosis not present

## 2022-11-08 DIAGNOSIS — G8250 Quadriplegia, unspecified: Secondary | ICD-10-CM | POA: Diagnosis present

## 2022-11-08 DIAGNOSIS — R509 Fever, unspecified: Secondary | ICD-10-CM | POA: Diagnosis not present

## 2022-11-08 DIAGNOSIS — D649 Anemia, unspecified: Secondary | ICD-10-CM | POA: Diagnosis present

## 2022-11-08 DIAGNOSIS — L89106 Pressure-induced deep tissue damage of unspecified part of back: Secondary | ICD-10-CM | POA: Diagnosis present

## 2022-11-08 DIAGNOSIS — E46 Unspecified protein-calorie malnutrition: Secondary | ICD-10-CM | POA: Diagnosis not present

## 2022-11-08 DIAGNOSIS — N4889 Other specified disorders of penis: Secondary | ICD-10-CM | POA: Diagnosis present

## 2022-11-08 DIAGNOSIS — N319 Neuromuscular dysfunction of bladder, unspecified: Secondary | ICD-10-CM | POA: Diagnosis present

## 2022-11-08 DIAGNOSIS — Z79899 Other long term (current) drug therapy: Secondary | ICD-10-CM | POA: Diagnosis not present

## 2022-11-08 DIAGNOSIS — R17 Unspecified jaundice: Secondary | ICD-10-CM | POA: Diagnosis present

## 2022-11-08 DIAGNOSIS — Z820 Family history of epilepsy and other diseases of the nervous system: Secondary | ICD-10-CM | POA: Diagnosis not present

## 2022-11-08 DIAGNOSIS — R600 Localized edema: Secondary | ICD-10-CM | POA: Diagnosis present

## 2022-11-08 LAB — BASIC METABOLIC PANEL
Anion gap: 11 (ref 5–15)
BUN: 10 mg/dL (ref 6–20)
CO2: 24 mmol/L (ref 22–32)
Calcium: 7.9 mg/dL — ABNORMAL LOW (ref 8.9–10.3)
Chloride: 107 mmol/L (ref 98–111)
Creatinine, Ser: 0.58 mg/dL — ABNORMAL LOW (ref 0.61–1.24)
GFR, Estimated: >60 mL/min (ref >60)
Glucose, Bld: 68 mg/dL — ABNORMAL LOW (ref 70–99)
Potassium: 3.1 mmol/L — ABNORMAL LOW (ref 3.5–5.1)
Sodium: 142 mmol/L (ref 135–145)

## 2022-11-08 MED ORDER — LIDOCAINE IN D5W 4-5 MG/ML-% IV SOLN
6.5000 mg/min | INTRAVENOUS | Status: AC
  Administered 2022-11-08: 6.5 mg/min via INTRAVENOUS
  Filled 2022-11-08 (×2): qty 500

## 2022-11-08 MED ORDER — HYDROMORPHONE HCL 1 MG/ML IJ SOLN
1.0000 mg | Freq: Once | INTRAMUSCULAR | Status: AC
  Administered 2022-11-08: 1 mg via INTRAVENOUS
  Filled 2022-11-08: qty 1

## 2022-11-08 MED ORDER — LIDOCAINE HCL (CARDIAC) PF 100 MG/5ML IV SOSY: 5.0000 mg/kg | PREFILLED_SYRINGE | Freq: Once | INTRAVENOUS | Status: DC

## 2022-11-08 MED ORDER — ESCITALOPRAM OXALATE 10 MG PO TABS
10.0000 mg | ORAL_TABLET | Freq: Every day | ORAL | Status: DC
  Administered 2022-11-08 – 2022-11-21 (×14): 10 mg via ORAL
  Filled 2022-11-08 (×14): qty 1

## 2022-11-08 MED ORDER — LAMOTRIGINE 25 MG PO TABS
50.0000 mg | ORAL_TABLET | Freq: Three times a day (TID) | ORAL | Status: DC
  Administered 2022-11-08 – 2022-11-09 (×4): 50 mg via ORAL
  Filled 2022-11-08 (×3): qty 2

## 2022-11-08 MED ORDER — FUROSEMIDE 10 MG/ML IJ SOLN
20.0000 mg | Freq: Once | INTRAMUSCULAR | Status: AC
  Administered 2022-11-08: 20 mg via INTRAVENOUS
  Filled 2022-11-08: qty 4

## 2022-11-08 MED ORDER — PREGABALIN 75 MG PO CAPS
75.0000 mg | ORAL_CAPSULE | Freq: Three times a day (TID) | ORAL | Status: DC
  Administered 2022-11-08 – 2022-11-09 (×6): 75 mg via ORAL
  Filled 2022-11-08 (×6): qty 1

## 2022-11-08 MED ORDER — ADULT MULTIVITAMIN W/MINERALS CH
1.0000 | ORAL_TABLET | Freq: Every day | ORAL | Status: DC
  Filled 2022-11-08 (×4): qty 1

## 2022-11-08 MED ORDER — LIDOCAINE IN D5W 4-5 MG/ML-% IV SOLN
6.5000 mg/min | INTRAVENOUS | Status: AC
  Filled 2022-11-08: qty 500

## 2022-11-08 MED ORDER — ENSURE ENLIVE PO LIQD
237.0000 mL | Freq: Three times a day (TID) | ORAL | Status: DC
  Administered 2022-11-08: 237 mL via ORAL

## 2022-11-08 MED ORDER — HYDROMORPHONE HCL 1 MG/ML IJ SOLN
0.5000 mg | Freq: Once | INTRAMUSCULAR | Status: AC
  Administered 2022-11-08: 0.5 mg via INTRAVENOUS
  Filled 2022-11-08: qty 0.5

## 2022-11-08 MED ORDER — LACTATED RINGERS IV SOLN: INTRAVENOUS | Status: AC

## 2022-11-08 MED ORDER — CHLORHEXIDINE GLUCONATE CLOTH 2 % EX PADS
6.0000 | MEDICATED_PAD | Freq: Every day | CUTANEOUS | Status: DC
  Administered 2022-11-08 – 2022-11-21 (×14): 6 via TOPICAL

## 2022-11-08 MED ORDER — LIDOCAINE HCL (PF) 2 % IJ SOLN
Freq: Once | INTRAVENOUS | Status: DC
  Filled 2022-11-08 (×2): qty 81

## 2022-11-08 MED ORDER — POTASSIUM CHLORIDE 10 MEQ/100ML IV SOLN
10.0000 meq | INTRAVENOUS | Status: AC
  Administered 2022-11-08 (×6): 10 meq via INTRAVENOUS
  Filled 2022-11-08 (×6): qty 100

## 2022-11-08 NOTE — Progress Notes (Signed)
Subjective:   Summary:[name] is a 62 y.o. with multiple sclerosis, quadriplegia, chronic Foley catheter, and trigeminal neuralgia who presented with worsening facial pain that prevents him from eating or speaking and was admitted for acutely worsening trigeminal neuralgia   Paged by nursing about right arm and penis swelling.  Went to evaluate patient and wife at bedside.  Patient has a history of right hand swelling at home but usually does not extend up to the elbow.  She also states that his Foley catheter was being pulled by its anchor earlier and has been adjusted.  The patient is not having any pain in his penis, scrotum, or right arm.  No other new or worsening issues noted.  Objective:  BP 118/77   Pulse 74   Temp 98.3 F (36.8 C)   Resp 15   Ht 5\' 9"  (1.753 m)   Wt 79.8 kg   SpO2 100%   BMI 25.98 kg/m    Physical Exam:  Constitutional: Middle-age male who appears uncomfortable, in no acute distress Cardiovascular: regular rate and rhythm Pulmonary/Chest: normal work of breathing on room air, lungs clear to auscultation bilaterally MSK: Right arm with 1-2+ pitting edema below the elbow without warmth, erythema, or tenderness to palpation.  Right AC IV in place without issues. GU: Penis is mildly edematous, nontender, and soft with Foley catheter in place.  No noted leakage around the Foley catheter at this point.  No blood in the urine or around the Foley catheter.   Filed Weights   11/07/22 1052 11/07/22 1703  Weight: 76.2 kg 79.8 kg     Intake/Output Summary (Last 24 hours) at 11/08/2022 1802 Last data filed at 11/08/2022 1744 Gross per 24 hour  Intake 450 ml  Output 2050 ml  Net -1600 ml   Net IO Since Admission: 308.38 mL [11/08/22 1802]  Pertinent Labs:    Latest Ref Rng & Units 11/07/2022    7:34 AM 10/23/2022    5:58 AM 10/22/2022    7:26 AM  CBC  WBC 4.0 - 10.5 K/uL 4.8  7.0  9.1   Hemoglobin 13.0 - 17.0 g/dL 9.7  10.6  11.2   Hematocrit 39.0 -  52.0 % 30.9  32.0  34.3   Platelets 150 - 400 K/uL 172  156  152        Latest Ref Rng & Units 11/08/2022    6:42 AM 11/07/2022    7:34 AM 10/24/2022    1:21 PM  CMP  Glucose 70 - 99 mg/dL 68  86  154   BUN 6 - 20 mg/dL 10  18  8    Creatinine 0.61 - 1.24 mg/dL 0.58  0.85  0.54   Sodium 135 - 145 mmol/L 142  144  139   Potassium 3.5 - 5.1 mmol/L 3.1  2.9  3.5   Chloride 98 - 111 mmol/L 107  104  106   CO2 22 - 32 mmol/L 24  28  24    Calcium 8.9 - 10.3 mg/dL 7.9  8.5  7.9     Assessment/Plan:     Right arm and penis/scrotum edema As above patient has edema in his right arm below the elbow and penis after Foley catheter adjustment.  There are no signs of IV associated thrombosis, soft tissue or joint infection, traumatic Foley catheter removal, or other acute worrisome issues.  Patient remains afebrile, hemodynamically stable, with no dyspnea, lower extremity edema, joint pains, decreased urine output, or bladder pain.  The location specifically of the swelling is on foot overall is consistent with peripheral edema.  Patient has noted recurrent edema in his right hand and recurrent edema in his feet at home.  Last echocardiogram from 2021 showed a preserved ejection fraction.  He also appears that he has been on Lasix 20 mg as needed for swelling associated with the wound in his lower extremity back in 2019.  We will give him a dose of Lasix and will monitor for improvement. -One-time dose of IV Lasix 20 mg  Johny Blamer, DO Internal Medicine Resident PGY-1 Pager: [phone]  Please contact the on call pager after 5 pm and on weekends at [phone].

## 2022-11-08 NOTE — Progress Notes (Signed)
Initial Nutrition Assessment RD working remotely.   DOCUMENTATION CODES:   Not applicable  INTERVENTION:  - ordered Ensure Plus High Protein TID, each supplement provides 350 kcal and 20 grams of protein.  - ordered 1 tablet multivitamin with minerals/day.  - complete NFPE when feasible.   NUTRITION DIAGNOSIS:   Inadequate oral intake related to chronic illness (trigeminal neuralgia) as evidenced by per patient/family report.  GOAL:   Patient will meet greater than or equal to 90% of their needs  MONITOR:   PO intake, Supplement acceptance, Labs, Weight trends  REASON FOR ASSESSMENT:   Malnutrition Screening Tool, Consult Assessment of nutrition requirement/status  ASSESSMENT:   62 year-old male with medical history of multiple sclerosis complicated by quadriplegia and chronic Foley catheter, trigeminal neuralgia, depression, asthma, dysphagia, migraines. He presented to the ED due to facial pain with inability to eat or drink due to pain. He was admitted 1/8-1/13 for similar symptoms.  A [place] RD saw patient and his wife in person on 10/22/22 at which time PO intakes were minimal. Discussion was had by RD about feeding tubes, how they work, and how they are used during that visit (TF recommendations in RD note on 10/22/22 in the event that they elected to proceed with PEG placement). Patient met criteria for severe malnutrition related to chronic illness as evidenced by energy intake <75% for >/= 1 month, 23% wt loss in <6 months. Patient noted to be wheelchair bound. NFPE performed with findings of severe depletions to orbital region, buccal region, and temple region; lower extremities excluded from exam.  RD able to talk with RN via secure chat this afternoon who shares that patient has had no or very minimal PO intake today; only able to take a very small amount of applesauce with pills. Ongoing difficulty in controlling pain which has been a limiting factor in ability  to consume PO intakes.  Weight yesterday was 176 lb and weight has been stable over the past 2 weeks. Generalized mild pitting edema documented in the edema section of flow sheet.   Labs reviewed; K: 3.1 mmol/l, creatinine: 0.58 mg/dl, Ca: 7.9 mg/dl. Medications reviewed; 10 mEq IV KCl x6 runs 1/28. IVF; LR @ 100 ml/hr.    NUTRITION - FOCUSED PHYSICAL EXAM:  RD working remotely.  Diet Order:   Diet Order             Diet regular Room service appropriate? Yes; Fluid consistency: Thin  Diet effective now                   EDUCATION NEEDS:   Not appropriate for education at this time  Skin:  Skin Assessment: Skin Integrity Issues: Skin Integrity Issues:: DTI, Stage I, Unstageable DTI: bilateral ischial tuberosities Stage I: L heel Unstageable: full thickness to vertebral column  Last BM:  PTA/unknown  Height:   Ht Readings from Last 1 Encounters:  11/07/22 5\' 9"  (1.753 m)    Weight:   Wt Readings from Last 1 Encounters:  11/07/22 79.8 kg     BMI:  Body mass index is 25.98 kg/m.  Estimated Nutritional Needs:  Kcal:  2000-2200 kcal Protein:  115-130 grams Fluid:  >/= 2 L/day     Jarome Matin, MS, RD, LDN, CNSC Clinical Dietitian PRN/Relief staff On-call/weekend pager # available in [facility]

## 2022-11-08 NOTE — Progress Notes (Signed)
Pt requesting IV dilaudid. Pt refusing to take PO pain meds at this time. Pt states " I can barely open my mouth and it hurts too bad to swallow." This nurse asked pt if liquid pain meds would be better, pt states "that's worse than the pills." Pt educated on transition from IV to PO pain meds to ensure effective pain control upon discharge home, with much difficulty. Call also placed to MD regarding IV dilaudid. Awaiting response.

## 2022-11-08 NOTE — Progress Notes (Signed)
See new 1x order for IV dilaudid.

## 2022-11-08 NOTE — Progress Notes (Signed)
SLP Cancellation Note  Patient Details Name: [name] MRN: [id number] DOB: 1961/01/15   Cancelled treatment:       Reason Eval/Treat Not Completed: Other (comment) (patient continues with severe pain). SLP reviewed notes from today with MD reporting patient continues with severe pain and it seems to be worsening. Patient was unable to complete swallow evaluation at bedside previous date due to severe pain from trigeminal neuralgia. Review of SLP notes from recent past admission (1/8-1/13/24) indicated that patient was not presenting with suspected oral or pharyngeal dysphagia but that his pain was limiting/prohibiting him from being able to consume PO's. SLP recommending to continue allowing patient and spouse to order PO's they think he could tolerate. SLP to s/o at this time but please reorder if patient exhibiting any excessive coughing, throat clearing or concern for aspiration. Thank you for this consult!   Sonia Baller, MA, CCC-SLP Speech Therapy

## 2022-11-08 NOTE — Progress Notes (Signed)
Pt c/o greater than 10/10 pain requesting more pain medication.  Pain advised that dilaudid was a 1 time order and percocet is evey 6 hours and can be given approx 0700.  Pt states "my medication at home is every 4 hours." Call placed to MD to notify.

## 2022-11-08 NOTE — Consult Note (Signed)
Neurology Consultation    Reason for Consult: Trigeminal Neuralgia  CC: Trigeminal neuralgia  HISTORY OF PRESENT ILLNESS   HPI  History obtained from patient, wife at bedside, and chart.    [name] is a 62 y.o. male with PMH significant for MS c/b spastic quadriparesis and neurogenic bladder, chronic foley, wheelchair bound at home, classic migraines, right-side Trigeminal Neuralgia, who presents to the ED via EMS due to 3 days of severe trigeminal neuralgia pain that has stopped him from eating    Patient has reportedly lost 40-50 lbs since July d/t reduced intake d/t facial pain. He has had multiple ED visits and hospitalizations for the same c/o severe right facial pain. Labs in the past have demonstrated dehydration, AKI, and hypokalemia.    [name] sees Dr. Felecia [name] at [facility] for his outpatient mgmt of both his MS and TN. Medications he has tried for his TN include: Oxcarbazapine, Gabapentin, Baclofen, Lamotrigine. Wife stated that none of these have offered relief, and that the oxcarbazapine caused his left arm to be extremely weak. He also underwent a nerve block in November and an ablation on 12/12. The wife states the block offered about 4 hours of relief and the ablation offered about 2-3 weeks of relief. The pain has increased again over the past few days, increasing to this level late last week.    The pain is described as always on the right side, upper cheek area. His wife states that he has almost no reprieve from the pain. Lamotrigine and Lyrica together seemed to be "taking the edge off" however his pain has become constant over the last 3 days.   He was admitted to the hospital from 10/19/2022-10/24/2022 with severe trigeminal neuralgia pain. During that admission he had a lidocaine infusion and then was started on lyrica and lamotrigine.   Current medications  Lidocaine IV 380mg  x1 Lyrica 75mg  Lamotrigine 150mg  daily  The patient denies headache,  dizziness, visual changes, problems with swallowing or speech, focal muscle weakness, numbness or tingling of her extremities, abnormal movements, or other focal neurological deficits.  ROS: Full ROS was performed and is negative except as noted in the HPI.   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 Family History  Problem Relation Age of Onset   Cancer Father    Multiple sclerosis Sister    Seizures Maternal Uncle    Parkinsonism Maternal Uncle    Multiple sclerosis Sister    Multiple sclerosis Paternal Uncle    Multiple sclerosis Other    Lung cancer Other        parent   Uterine cancer Other        other    Allergies:  Allergies  Allergen Reactions   Gadopiclenol Nausea And Vomiting    Social History:   reports that he has quit smoking. He has never used smokeless tobacco. He reports that he does not drink alcohol and does not use drugs.    Medications Medications Prior to Admission  Medication Sig Dispense Refill   acetaminophen (TYLENOL) 500  MG tablet Take 1,000 mg by mouth every 6 (six) hours as needed for moderate pain.     albuterol (VENTOLIN HFA) 108 (90 Base) MCG/ACT inhaler Inhale 2 puffs into the lungs every 6 (six) hours as needed. (Patient taking differently: Inhale 1-2 puffs into the lungs every 6 (six) hours as needed for wheezing.) 1 each 3   baclofen (LIORESAL) 10 MG tablet Take 10 mg by mouth daily as needed for muscle spasms.     Carboxymethylcellulose Sod PF (THERATEARS) 1 % GEL Place 1 drop into both eyes daily as needed (dry eyes).     Dextromethorphan-Quinidine 20-10 MG CAPS Take 1 tablet by mouth 2 (two)  times daily. (Patient taking differently: Take 1 tablet by mouth daily.) 60 capsule 3   escitalopram (LEXAPRO) 10 MG tablet Take 10 mg by mouth daily.     gabapentin (NEURONTIN) 100 MG capsule Take 100 mg by mouth daily as needed (nerve pain).     ibuprofen (ADVIL) 200 MG tablet Take 600 mg by mouth every 6 (six) hours as needed for headache or moderate pain.     [START ON 11/18/2022] lamoTRIgine (LAMICTAL) 100 MG tablet Take 1 tablet (100 mg total) by mouth daily for 7 days. (Patient taking differently: Take 50 mg by mouth 3 (three) times daily.) 7 tablet 0   leptospermum manuka honey (MEDIHONEY) PSTE paste Apply 1 Application topically daily. 15 mL 0   modafinil (PROVIGIL) 100 MG tablet TAKE 1 TABLET (100 MG TOTAL) BY MOUTH IN THE MORNING. *SENT FOR PA* (Patient taking differently: Take 100 mg by mouth daily.) 90 tablet 1   ocrelizumab 600 mg in sodium chloride 0.9 % 500 mL Inject 600 mg into the vein every 6 (six) months.      oxyCODONE-acetaminophen (PERCOCET) 5-325 MG tablet Take 1 tablet by mouth every 6 (six) hours as needed for severe pain. 60 tablet 0   potassium chloride SA (KLOR-CON M) 20 MEQ tablet Take 2 tablets (40 mEq total) by mouth daily. (Patient taking differently: Take 20 mEq by mouth 2 (two) times daily.) 60 tablet 0   pregabalin (LYRICA) 75 MG capsule Take 1 capsule (75 mg total) by mouth 3 (three) times daily. 90 capsule 0   acetaminophen (TYLENOL) 325 MG tablet Take 2 tablets (650 mg total) by mouth every 6 (six) hours as needed for mild pain, fever or moderate pain (or Fever >/= 101). (Patient not taking: Reported on 11/07/2022) 30 tablet 0   lamoTRIgine (LAMICTAL) 25 MG tablet Take 2 tablets (50 mg total) by mouth daily for 14 days. (Patient not taking: Reported on 11/07/2022) 28 tablet 0   lamoTRIgine (LAMICTAL) 25 MG tablet Take 1 tablet (25 mg total) by mouth daily for 10 days. (Patient not taking: Reported on 11/07/2022) 10 tablet 0   ondansetron (ZOFRAN-ODT) 4 MG  disintegrating tablet 4mg  ODT q4 hours prn nausea/vomit (Patient not taking: Reported on 08/19/2022) 6 tablet 0    EXAMINATION    Current vital signs:    11/08/2022    7:23 AM 11/08/2022    4:15 AM 11/07/2022   11:59 PM  Vitals with BMI  Systolic 790  240  Diastolic 75  66  Pulse 76 70 72    Examination:  GENERAL: Ill appearing, malnourished, NAD HEENT: - Normocephalic and atraumatic, dry mm, no lymphadenopathy, no Thyromegally LUNGS - Clear to auscultation bilaterally CV - S1S2 RRR, equal pulses bilaterally. ABDOMEN - Soft, nontender, nondistended with normoactive BS Ext: warm, well perfused, intact peripheral pulses, no  pedal edema  Mental status/Cognition: Alert oriented, nods appropriately. Follows commands.  Answers questions predominantly by nodding due to pain with talking.  Cranial nerves:  PERRL, EOMI, Multi-directional nystagmus (left & right) Reduced sensation right-side of face. (States this has been present since his ablation) Weakened facial movements. No asymmetry.  Hearing intact to voice.  Motor:  Profound generalized weakness.  Able to squeeze with both hands, 3/5 in LUE, 0/5 in proximal RUE  Unable to left either leg off bed or bend knees, Flicker of movement on the left toes.  Big toe constantly in the babinski reflex position.  Sensation: Decreased sensation RUE. Decreased sensation BLE.  Coordination/Complex Motor:  - Finger to Nose: unable to perform d/t weakness - Gait: deferred  LABS   I have reviewed labs in epic and the results pertinent to this consultation are:   Lab Results  Component Value Date   LDLCALC 163 (H) 08/14/2015   Lab Results  Component Value Date   ALT 12 10/19/2022   AST 28 10/19/2022   ALKPHOS 91 10/19/2022   BILITOT 1.9 (H) 10/19/2022   Lab Results  Component Value Date   HGBA1C 5.4 08/14/2015   Lab Results  Component Value Date   WBC 4.8 11/07/2022   HGB 9.7 (L) 11/07/2022   HCT 30.9 (L) 11/07/2022   MCV  99.7 11/07/2022   PLT 172 11/07/2022   No results found for: "VITAMINB12" No results found for: "FOLATE" Lab Results  Component Value Date   NA 142 11/08/2022   K 3.1 (L) 11/08/2022   CL 107 11/08/2022   CO2 24 11/08/2022     DIAGNOSTIC IMAGING/PROCEDURES   I have reviewed the images obtained:, as below   No brain imaging performed on this admission   Assessment: 62 y.o. male with PMH significant for MS c/b spastic quadriparesis and neurogenic bladder, chronic foley, wheelchair bound at home, classic migraines, right-side Trigeminal Neuralgia, who presents to the ED via EMS due to 3 days of severe trigeminal neuralgia pain that has stopped him from eating.  - He had approxximately one hour of releif from the lidocaine infusion that was given yesterday in the ED.  - We will attempt one more lidocaine infusion and if that does not help alleviate his discomfort we will try a fosphenytoin bolus.  - Hypokalemic- will need replacement prior to lidocaine. - Another options is to do a continuous ketamine infusion as this has been effective for patients with refractory facial pain. The protocol starts patients at 10 mg/hr and the ketamine infusion is then titrated up to 50 mg/hr. He would need to be moved to the ICU for this treatment. (Reference: Juanda Chance, Ihor Austin Continuous Ketamine Infusion as a Treatment for Refractory Facial Pain. Cureus. 2023 Mar 1;15(3):e35638. doi: 10.7759/cureus.96045. PMID: 40981191; PMCID: YNW29562130.)  Impression: Trigeminal neuralgia leading to decreased PO intake and failure to thrive  Recommendations: - One more lidocaine bolus 5 mg/kg - If this is not effective we will try 1000 mg of IV fosphenytoin - Optimize electrolytes - PRN oxycodone and dilaudid   -- Patient seen and examined by NP/APP with MD.  Elmer Picker, DNP, FNP-BC [facility] Pager: [phone]  I have seen and examined the patient. I have  formulated the assessment and recommendations. 62 y.o. male with PMH significant for MS c/b spastic quadriparesis and neurogenic bladder, chronic foley, wheelchair bound at home, classic migraines, right-side Trigeminal Neuralgia, who presents to the ED via  EMS due to 3 days of severe trigeminal neuralgia pain that has stopped him from eating. Exam reveals severe BLE spastic weakness, 0/5 proximal RUE and 3/5 LUE strength as well as sensory deficits secondary to his MS. He had approxximately one hour of releif from the lidocaine infusion that was given yesterday in the ED. Recommendations as above.  Electronically signed: Dr. Kerney Elbe

## 2022-11-08 NOTE — Progress Notes (Addendum)
Interval history His pain is not letting up. The pain is the same in terms of location and characteristics.  He and his wife report that they are upset with a lack of attention to his needs.   Physical exam Blood pressure 113/66, pulse 70, temperature 97.6 F (36.4 C), temperature source Oral, resp. rate 17, height 5\' 9"  (1.753 m), weight 79.8 kg, SpO2 99 %.  Uncomfortable appearing Heart rate and rhythm regular, radial pulse 2+ Breathing is regular and unlabored Skin is warm and dry Alert, answers simple questions, speech limited by pain  Intake/Output Summary (Last 24 hours) at 11/08/2022 7793 Last data filed at 11/08/2022 0416 Gross per 24 hour  Intake 2358.38 ml  Output 150 ml  Net 2208.38 ml    Labs Potassium 3.1  Assessment and plan Hospital day 0  [name] is a 62 y.o. with multiple sclerosis, quadriplegia, chronic Foley catheter, and trigeminal neuralgia who presented with worsening facial pain that prevents him from eating or speaking and was admitted for acutely worsening trigeminal neuralgia  Principal Problem:   Trigeminal neuralgia pain  Trigeminal neuralgia Status post lidocaine infusion.  Unfortunately, the patient's pain is still severe and in fact worsening.  Received a couple doses of Dilaudid and oxycodone yesterday.  Home pregabalin and lamotrigine restarted.  Appreciate neurology's assistance with this case.  Also requesting palliative care assistance for this patient with severe pain in addition to a heavy symptom burden from comorbid MS and pressure wounds, whose wife is struggling to care for him at home. - Lamotrigine 50 mg 3 times daily - Pregabalin 75 mg 3 times daily - Anticipate another lidocaine infusion and possibly fosphenytoin, per preliminary neurology recommendation  Hypokalemia Potassium 3.1 today.  Magnesium 1.9.  Likely secondary to poor nutrition in setting of severe pain due to trigeminal neuralgia. 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 - Maintain urinary catheter  Pressure wounds Need to evaluate and get a picture in the chart.  Appreciate wound ostomy continence and their assistance with this case.  Depression No longer NPO.  Will restart home regimen. - Escitalopram 10 mg daily  Diet: Regular IVF: LR 100 mL/h x 10 h VTE: Enoxaparin Code: Partial, DO NOT INTUBATE Family Update: Wife at bedside  [name] MD 11/08/2022, 6:25 AM  Pager: [phone] After 5pm on weekdays and 1pm on weekends: [phone]

## 2022-11-09 ENCOUNTER — Inpatient Hospital Stay (HOSPITAL_COMMUNITY): Payer: No Typology Code available for payment source

## 2022-11-09 DIAGNOSIS — G500 Trigeminal neuralgia: Secondary | ICD-10-CM | POA: Diagnosis not present

## 2022-11-09 DIAGNOSIS — D649 Anemia, unspecified: Secondary | ICD-10-CM | POA: Insufficient documentation

## 2022-11-09 DIAGNOSIS — R609 Edema, unspecified: Secondary | ICD-10-CM

## 2022-11-09 LAB — HEPATIC FUNCTION PANEL
ALT: 11 U/L (ref 0–44)
AST: 21 U/L (ref 15–41)
Albumin: 2.9 g/dL — ABNORMAL LOW (ref 3.5–5.0)
Alkaline Phosphatase: 90 U/L (ref 38–126)
Bilirubin, Direct: 0.3 mg/dL — ABNORMAL HIGH (ref 0.0–0.2)
Indirect Bilirubin: 1.4 mg/dL — ABNORMAL HIGH (ref 0.3–0.9)
Total Bilirubin: 1.7 mg/dL — ABNORMAL HIGH (ref 0.3–1.2)
Total Protein: 5.3 g/dL — ABNORMAL LOW (ref 6.5–8.1)

## 2022-11-09 LAB — CBC
HCT: 32.2 % — ABNORMAL LOW (ref 39.0–52.0)
Hemoglobin: 10.9 g/dL — ABNORMAL LOW (ref 13.0–17.0)
MCH: 32.2 pg (ref 26.0–34.0)
MCHC: 33.9 g/dL (ref 30.0–36.0)
MCV: 95.3 fL (ref 80.0–100.0)
Platelets: 159 10*3/uL (ref 150–400)
RBC: 3.38 MIL/uL — ABNORMAL LOW (ref 4.22–5.81)
RDW: 16.4 % — ABNORMAL HIGH (ref 11.5–15.5)
WBC: 7.2 10*3/uL (ref 4.0–10.5)
nRBC: 0 % (ref 0.0–0.2)

## 2022-11-09 LAB — BASIC METABOLIC PANEL
Anion gap: 14 (ref 5–15)
BUN: <5 mg/dL — ABNORMAL LOW (ref 6–20)
CO2: 25 mmol/L (ref 22–32)
Calcium: 8.4 mg/dL — ABNORMAL LOW (ref 8.9–10.3)
Chloride: 98 mmol/L (ref 98–111)
Creatinine, Ser: 0.71 mg/dL (ref 0.61–1.24)
GFR, Estimated: >60 mL/min (ref >60)
Glucose, Bld: 76 mg/dL (ref 70–99)
Potassium: 3.5 mmol/L (ref 3.5–5.1)
Sodium: 137 mmol/L (ref 135–145)

## 2022-11-09 LAB — MAGNESIUM: Magnesium: 1.7 mg/dL (ref 1.7–2.4)

## 2022-11-09 MED ORDER — GADOBUTROL 1 MMOL/ML IV SOLN
8.0000 mL | Freq: Once | INTRAVENOUS | Status: AC | PRN
  Administered 2022-11-09: 8 mL via INTRAVENOUS

## 2022-11-09 MED ORDER — MEDIHONEY WOUND/BURN DRESSING EX PSTE
1.0000 | PASTE | Freq: Every day | CUTANEOUS | Status: DC
  Administered 2022-11-09 – 2022-11-21 (×12): 1 via TOPICAL
  Filled 2022-11-09 (×3): qty 44

## 2022-11-09 MED ORDER — ORAL CARE MOUTH RINSE: 15.0000 mL | OROMUCOSAL | Status: DC | PRN

## 2022-11-09 MED ORDER — LAMOTRIGINE 25 MG PO TABS: 50.0000 mg | ORAL_TABLET | ORAL | Status: DC

## 2022-11-09 MED ORDER — LAMOTRIGINE 25 MG PO TABS
50.0000 mg | ORAL_TABLET | ORAL | Status: DC
  Administered 2022-11-09: 50 mg via ORAL
  Filled 2022-11-09: qty 2

## 2022-11-09 MED ORDER — LACTATED RINGERS IV SOLN: INTRAVENOUS | Status: AC

## 2022-11-09 MED ORDER — SODIUM CHLORIDE 0.9 % IV SOLN
1000.0000 mg | Freq: Once | INTRAVENOUS | Status: AC
  Administered 2022-11-09: 1000 mg via INTRAVENOUS
  Filled 2022-11-09: qty 20

## 2022-11-09 NOTE — Progress Notes (Signed)
  Transition of Care [facility]) Screening Note   Patient Details  Name: [name] Date of Birth: 1961/07/06   Transition of Care [facility]) CM/SW Contact:    Pollie Friar, RN Phone Number: 11/09/2022, 1:43 PM   Pt is from home with his spouse. Awaiting palliative care consult. Transition of Care Department [facility]) has reviewed patient. We will continue to monitor patient advancement through interdisciplinary progression rounds. If new patient transition needs arise, please place a TOC consult.

## 2022-11-09 NOTE — Progress Notes (Addendum)
NEUROLOGY CONSULTATION PROGRESS NOTE   Date of service: November 09, 2022 Patient Name: [name] MRN:  [id number] DOB:  04/24/1961  Brief HPI  Michaelanthony DEVONTRE SIEDSCHLAG is a 62 y.o. male with PMH significant for MS c/b spastic quadriparesis and neurogenic bladder, chronic foley, wheelchair bound at home, classic migraines, right-side Trigeminal Neuralgia, who presents to the ED via EMS due to 3 days of severe trigeminal neuralgia pain that has stopped him from eating.   Interval Hx   No significant change in pain. Dilaudid and oxy Q3hours helped him during prior flare ups. Wife reports losing more than 40 lbs from not eating.  He was stable on lyrica and lamictal and then flared up.  Lidocaine did not help him much. It did help him in the past.  Vitals   Vitals:   11/08/22 2307 11/09/22 0359 11/09/22 0720 11/09/22 1120  BP: 114/73 100/65 105/69 122/75  Pulse: 84 89 87 85  Resp: 17 16 16 16   Temp: 98.5 F (36.9 C) 97.7 F (36.5 C) 98.3 F (36.8 C) 98.1 F (36.7 C)  TempSrc: Oral Oral Oral Oral  SpO2: 97% 94% 94% 98%  Weight:      Height:         Body mass index is 25.98 kg/m.  Physical Exam   General: Laying comfortably in bed; does not speak much as it causes flare up of trigeminal pain, nods head to answer questions. HENT: Normal external appearance of ears and nose. Unable to assess oropharynx 2/2 trigeminal neuralgia. Neck: Supple, no pain or tenderness  CV: No JVD. No peripheral edema.  Pulmonary: Symmetric Chest rise. Normal respiratory effort.  Abdomen: Soft to touch, non-tender.  Ext: No cyanosis, edema, or deformity  Skin: No rash. Normal palpation of skin.   Musculoskeletal: Normal digits and nails by inspection. No clubbing.   Neurologic Examination limited exam performed 2/2 concern for movement flaring up his trigeminal neuralgia.  Mental status/Cognition: Alert, nods his face to answer questions, does not speak as it flares up his trigeminal  neuralgia. Speech/language: nods his face to answer questions, does not speak as it flares up his trigeminal neuralgia. Cranial nerves:   CN II Pupils equal and reactive to light, makes eye contact on left and right.   CN III,IV,VI Looks left and right. No obvious nystegmus   CN V normal sensation in V1, V2, and V3 segments bilaterally   CN VII no asymmetry, no nasolabial fold flattening   CN VIII normal hearing to speech   CN IX & X Unable to assess   CN XI Head midline   CN XII Unable to assess,   Motor:  Muscle bulk: poor Moves BL upper extremities on command and able to give thumbs up. Does not wiggles toes on command. Chronically wheelchair dependent 2/2 MS.  Sensation:  Light touch Intact in all extremities.   Pin prick    Temperature    Vibration   Proprioception    Coordination/Complex Motor:  Unable to asess for concerns for causing a flare up of his trigeminal neurlagia.  Labs   Basic Metabolic Panel:  Lab Results  Component Value Date   NA 137 11/09/2022   K 3.5 11/09/2022   CO2 25 11/09/2022   GLUCOSE 76 11/09/2022   BUN <5 (L) 11/09/2022   CREATININE 0.71 11/09/2022   CALCIUM 8.4 (L) 11/09/2022   GFRNONAA >60 11/09/2022   GFRAA 84 07/29/2020   HbA1c:  Lab Results  Component Value Date  HGBA1C 5.4 08/14/2015   LDL:  Lab Results  Component Value Date   LDLCALC 163 (H) 08/14/2015   Urine Drug Screen: No results found for: "LABOPIA", "COCAINSCRNUR", "LABBENZ", "AMPHETMU", "THCU", "LABBARB"  Alcohol Level No results found for: "ETH" No results found for: "PHENYTOIN", "ZONISAMIDE", "LAMOTRIGINE", "LEVETIRACETA" No results found for: "PHENYTOIN", "PHENOBARB", "VALPROATE", "CBMZ"  Imaging and Diagnostic studies   CT Head Wo Contrast 1. Stable atrophy pattern. 2. No acute intracranial abnormality by noncontrast CT. No interval change.  MRI FACE trigeminal w/wo C: pending  Impression   [name] is a 62 y.o. male with PMH significant for  MS c/b spastic quadriparesis and neurogenic bladder, chronic foley, wheelchair bound at home, classic migraines, right-side Trigeminal Neuralgia, who presents to the ED via EMS due to 3 days of severe trigeminal neuralgia pain that has stopped him from eating and leading to failure to thrive.  Recommendations  - Lidocaine only offered temporary relief. - will try fosphenytoin 1000mg  IV once. Ordered. - May need to consider Ketamine infusion if refractory. Will need to go to ICU for this thou. - Increase Lamotrigine to 50mg  in AM, 50mg  at lunch time and 100mg  at bedtime starting tonight. - continue Lyrica at current dose. - PRN opiods while uptitrating non narcotic medications. - MRI Trigeminal nerve with IAC protocol to evaluate for a vascular loop that could be potentially compressing trigeminal nerve. Decompression in the setting may help with his pain. However, likely trigeminal neuralgia is secondary to MS. ______________________________________________________________________   Thank you for the opportunity to take part in the care of this patient. If you have any further questions, please contact the neurology consultation attending.  Signed,  [place] Pager Number [phone]

## 2022-11-09 NOTE — Progress Notes (Signed)
Interval history Still with significant pain, although last night was better than the night before.  He is able to open his mouth.  Any food or drink that touches the right side of his mouth causes severe pain.  He is also concerned about right upper extremity and scrotal/penile edema.  Physical exam Blood pressure 105/69, pulse 87, temperature 98.3 F (36.8 C), temperature source Oral, resp. rate 16, height 5\' 9"  (1.753 m), weight 79.8 kg, SpO2 94 %.  Uncomfortable appearing Dry oral mucous membranes Heart rate and rhythm are regular, right upper extremity and scrotal/penile edema Breathing is regular and unlabored, no wheezing on auscultation Skin is warm and dry, no jaundice Alert, nods/shakes head to simple questions, speech limited by pain  Intake/Output Summary (Last 24 hours) at 11/09/2022 1115 Last data filed at 11/09/2022 0414 Gross per 24 hour  Intake 988.36 ml  Output 4700 ml  Net -3711.64 ml    Labs Potassium 3.5 Magnesium 1.7 Albumin 2.9 Total protein 5.3 Alkaline phosphatase 90 AST/ALT 21/11 Total bilirubin 1.7 Direct bilirubin 0.3 Indirect bilirubin 1.4 Hemoglobin 10.9  Assessment and plan Hospital day [place] is a 62 y.o. with multiple sclerosis, quadriplegia, neurogenic bladder, and trigeminal neuralgia, admitted for acute attack of trigeminal neuralgia complicated by inability to eat or speak.  Principal Problem:   Trigeminal neuralgia pain Active Problems:   Neurogenic bladder   Spastic quadriparesis (HCC)   Hypokalemia   Edema   Hyperbilirubinemia   Anemia  Trigeminal neuralgia On pregabalin and lamotrigine at home.  Status post 2 lidocaine infusions.  As needed narcotic pain medicine.  Pain is still severe.  Still unable to eat or drink.  On physical exam is oral mucous membranes are very dry.  May need to start IV fluids for hydration.  Appreciate neurology assistance with this case. - Continue home lamotrigine and  pregabalin - Considering fosphenytoin infusion  Edematous state Right upper extremity and scrotal/penile edema.  Nonpitting in the right upper extremity.  Protein and albumin are somewhat low.  Suspect this is due to malnutrition in the setting of trigeminal neuralgia attack precluding this patient from eating or drinking.  Holding diuresis to avoid worsening dehydration.  Ordered discontinuation of the right upper extremity IV. - Supportive care  Elevated indirect bilirubin Mild normocytic anemia No jaundice on exam.  No signs of overt bleeding.  Hyperbilirubinemia is noted intermittently throughout this patient's historical lab studies, seems to correlate with hospitalizations.  Anemia is relatively acute, first noted on this admission.  Hemoglobin has been mostly stable for the duration of hospitalization.  Could be some small degree of hemolysis or Gilbert syndrome.  In the setting of this patient's acute illness I do not feel like there is an indication for further inpatient workup or invention at this time.  Hypokalemia Potassium 3.5 today.  Magnesium 1.7.   Likely secondary to poor nutrition in setting of severe pain due to trigeminal neuralgia. 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 - Maintain urinary catheter   Pressure wounds Appreciate wound ostomy continence and their assistance with this case. - Supportive care   Depression - Escitalopram 10 mg daily   Diet: Regular IVF: LR 100 mL/h x 10 h VTE: Enoxaparin Code: Partial, DO NOT INTUBATE Family Update: Wife at bedside  [name] MD 11/09/2022, 11:15 AM  Pager:  [phone] After 5pm on weekdays and 1pm on weekends: [phone]

## 2022-11-09 NOTE — Consult Note (Signed)
[place] Nurse Consult Note: Reason for Consult: admitted from home. Trigeminal neuralgia in the setting of MS.  Wife cares for patient at home.  He has had intact skin until this latest illness  Has had scarring to bilateral anterior lower legs from wounds in 2018.  Pink intact areas now.  She states they sometimes weep if he swells.  Unstageable pressure injuries to thoracic spine and bilateral ischial tuberosities Wound type:pressure Pressure Injury POA: Yes Measurement:  thoracic spine  2 areas:  Proximal:  0.6 cm x 0.3 cm slough  distal 1 cm x 0.3 cm slough LEft ischium:  2 cm x 4 cm slough Right ischium:  3 cm x 4 cm slough Wound bed: 100 % slough Drainage (amount, consistency, odor) minimal serosanguinous  Periwound: intact  Dressing procedure/placement/frequency: cleanse wounds to spine with NS and pat dry. Apply medihoney to wound bed on spinal and ischial wounds.  Cover with gauze and silicone foam dressing  Change daily.  Prevalon boots Low air loss mattress Discussed with wife limiting time in chair due to ischial wounds.  She agrees  states it is difficult to pull patient up in bed at home  Has a lift for transfers.   Will not follow at this time.  Please re-consult if needed.  Estrellita Ludwig MSN, RN, FNP-BC [facility] Wound, Ostomy, Continence Nurse [facility] [phone] Pager [phone]

## 2022-11-09 NOTE — Progress Notes (Signed)
      Consult received. Chart reviewed. Discussed with Dr. Carin Primrose. He reports patient's pain is improved today.  Recommendations: - Continue oxycodone-acetaminophen every 6 hours as needed - Consider increasing lyrica (max 600 mg daily in divided doses) - Clarify code status. There is a durable DNR on file from November 2023  Palliative will sign off. Please re-consult if needed.     Elie Confer, NP-C Palliative Medicine   Please call Palliative Medicine team phone with any questions [phone]. For individual providers please see AMION.   No charge

## 2022-11-10 DIAGNOSIS — G500 Trigeminal neuralgia: Secondary | ICD-10-CM | POA: Diagnosis not present

## 2022-11-10 LAB — BASIC METABOLIC PANEL
Anion gap: 16 — ABNORMAL HIGH (ref 5–15)
BUN: 5 mg/dL — ABNORMAL LOW (ref 6–20)
CO2: 23 mmol/L (ref 22–32)
Calcium: 8.4 mg/dL — ABNORMAL LOW (ref 8.9–10.3)
Chloride: 97 mmol/L — ABNORMAL LOW (ref 98–111)
Creatinine, Ser: 0.71 mg/dL (ref 0.61–1.24)
GFR, Estimated: >60 mL/min (ref >60)
Glucose, Bld: 78 mg/dL (ref 70–99)
Potassium: 3.3 mmol/L — ABNORMAL LOW (ref 3.5–5.1)
Sodium: 136 mmol/L (ref 135–145)

## 2022-11-10 LAB — CBC
HCT: 34.5 % — ABNORMAL LOW (ref 39.0–52.0)
Hemoglobin: 11.1 g/dL — ABNORMAL LOW (ref 13.0–17.0)
MCH: 31.3 pg (ref 26.0–34.0)
MCHC: 32.2 g/dL (ref 30.0–36.0)
MCV: 97.2 fL (ref 80.0–100.0)
Platelets: 175 10*3/uL (ref 150–400)
RBC: 3.55 MIL/uL — ABNORMAL LOW (ref 4.22–5.81)
RDW: 16.5 % — ABNORMAL HIGH (ref 11.5–15.5)
WBC: 7.6 10*3/uL (ref 4.0–10.5)
nRBC: 0 % (ref 0.0–0.2)

## 2022-11-10 LAB — MAGNESIUM: Magnesium: 1.7 mg/dL (ref 1.7–2.4)

## 2022-11-10 MED ORDER — LAMOTRIGINE 25 MG PO TABS: 50.0000 mg | ORAL_TABLET | ORAL | Status: DC

## 2022-11-10 MED ORDER — LAMOTRIGINE 25 MG PO TABS
50.0000 mg | ORAL_TABLET | ORAL | Status: DC
  Administered 2022-11-10 – 2022-11-13 (×7): 50 mg via ORAL
  Filled 2022-11-10 (×7): qty 2

## 2022-11-10 MED ORDER — POTASSIUM CHLORIDE CRYS ER 20 MEQ PO TBCR
40.0000 meq | EXTENDED_RELEASE_TABLET | Freq: Two times a day (BID) | ORAL | Status: AC
  Administered 2022-11-10 (×2): 40 meq via ORAL
  Filled 2022-11-10 (×2): qty 2

## 2022-11-10 MED ORDER — LACTATED RINGERS IV SOLN: INTRAVENOUS | Status: DC

## 2022-11-10 MED ORDER — LAMOTRIGINE 100 MG PO TABS
100.0000 mg | ORAL_TABLET | Freq: Every day | ORAL | Status: AC
  Administered 2022-11-10 – 2022-11-12 (×3): 100 mg via ORAL
  Filled 2022-11-10 (×3): qty 1

## 2022-11-10 MED ORDER — LACTATED RINGERS IV SOLN: INTRAVENOUS | Status: AC

## 2022-11-10 MED ORDER — PREGABALIN 75 MG PO CAPS
75.0000 mg | ORAL_CAPSULE | ORAL | Status: DC
  Administered 2022-11-10 – 2022-11-17 (×22): 75 mg via ORAL
  Filled 2022-11-10 (×22): qty 1

## 2022-11-10 NOTE — Progress Notes (Addendum)
Nutrition Follow-up  DOCUMENTATION CODES:  Severe malnutrition in context of chronic illness  INTERVENTION:  Continue current diet as tolerated Discontinue ensure and MVI, pt is refusing both interventions at this time.  Re-engaged pt/wife on discussing feeding tubes and encouraged consideration of placement to avoid continued weight loss If proceeds with PEG placement recommend Osmolite 1.5 at 25 ml/h and increase by 10 ml every 8 hours to goal rate of 55 ml/hr (1320 ml per day) Prosource TF20 60 ml BID Provides 2140 kcal, 122 gm protein, 1003 ml free water daily Monitor magnesium and phosphorus every 12 hours x 4 occurrences, MD to replete as needed, as pt is at risk for refeeding syndrome given severe malnutrition.  After labs normal and has reached goal would transition to bolus TF for home.  2 containers Osmolite 1.5 TID (6 cartons/d) Provides: 2130 kcal and 89 grams protein    NUTRITION DIAGNOSIS:   Severe Malnutrition related to chronic illness (trigeminal neuralgia) as evidenced by severe fat depletion, severe muscle depletion, percent weight loss. - revised 1/30  GOAL:  Patient will meet greater than or equal to 90% of their needs - not progressing, poor PO intake, refusing supplements  MONITOR:   PO intake, Supplement acceptance, Labs, Weight trends  REASON FOR ASSESSMENT:   Malnutrition Screening Tool, Consult Assessment of nutrition requirement/status  ASSESSMENT:   62 year-old male with medical history of multiple sclerosis complicated by quadriplegia and chronic Foley catheter, trigeminal neuralgia, depression, asthma, dysphagia, migraines. He presented to the ED due to facial pain with inability to eat or drink due to pain. He was admitted 1/8-1/13 for similar symptoms.  Pt resting in bed at the time of assessment, wife at bedside. Pt having a difficult time eating due to pain. Wife reports that pt is refusing liquids and MVI, not consuming ensure. Will remove  both interventions.   Pt and wife were educated extensively on PEG during admission earlier this month. Reviewed the process and benefits of placement to ease the burden of pt having to meet needs orally while in extreme pain. Discussed that he is malnourished and that I have concerns that without enteral support he will continue to lose weight and subsequently functional status. Wife is agreeable to the intervention, however, pt is hesitant. Seems that pt is still mourning the loss of his prior quality of life and ability to eat - which is understandable.   On physical exam, pt noted to have edema to his BLE. Wife reports that he also has swelling to the scrotum. Did advice that edema is a sign of malnutrition due to low body stores of protein.   Did encourage pt to think over his options and reminded him that the PEG would not prevent him from eating but that would be a tool the medical team could use to ensure he could obtain his nutrition and have access to administering medications. Did also review the process for placement (IR consult, placement, monitoring for tolerance, etc)  Noted 22% weight loss x 6 months (04/2022-10/2022) which is extremely severe for this time frame.   Average Meal Intake: 1/28: 25% intake x 1 recorded meals  Nutritionally Relevant Medications: Scheduled Meds:  Ensure Enlive  237 mL Oral TID BM   multivitamin with minerals  1 tablet Oral Daily   potassium chloride  40 mEq Oral BID   Labs Reviewed: K 3.3 Chloride 97 BUN 5  NUTRITION - FOCUSED PHYSICAL EXAM: Flowsheet Row Most Recent Value  Orbital Region Severe depletion  Upper Arm Region Mild depletion  Thoracic and Lumbar Region Mild depletion  Buccal Region Severe depletion  Temple Region Severe depletion  Clavicle Bone Region Mild depletion  Clavicle and Acromion Bone Region Mild depletion  Scapular Bone Region Mild depletion  Dorsal Hand No depletion  Edema (RD Assessment) Moderate  [legs, scrotal]   Hair Reviewed  Eyes Reviewed  Mouth Reviewed  Skin Reviewed  Nails Reviewed    Diet Order:   Diet Order             Diet regular Room service appropriate? Yes; Fluid consistency: Thin  Diet effective now                   EDUCATION NEEDS:  Education needs have been addressed  Skin:  Skin Assessment: Skin Integrity Issues: Skin Integrity Issues:: DTI, Stage I, Unstageable DTI: bilateral ischial tuberosities Stage I: L heel Unstageable: full thickness to vertebral column  Last BM:  1/27  Height:  Ht Readings from Last 1 Encounters:  11/07/22 5\' 9"  (1.753 m)    Weight:  Wt Readings from Last 1 Encounters:  11/07/22 79.8 kg    Ideal Body Weight:  72.7 kg  BMI:  Body mass index is 25.98 kg/m.  Estimated Nutritional Needs:  Kcal:  2000-2200 kcal Protein:  115-130 grams Fluid:  >/= 2 L/day    [name], RD, LDN Clinical Dietitian RD pager # available in [place]  After hours/weekend pager # available in [facility]

## 2022-11-10 NOTE — Progress Notes (Signed)
Interval history 1 dose of as needed Percocet overnight.  Feels somewhat better today than yesterday.  Pain is improved.  Tolerating solids by mouth.  Liquid is still an unbearable pain trigger.  Physical exam Blood pressure 100/70, pulse 85, temperature 97.7 F (36.5 C), temperature source Oral, resp. rate 16, height 5\' 9"  (1.753 m), weight 79.8 kg, SpO2 100 %.  Comfortable appearing Dry oral mucous membranes Heart rate and rhythm are regular Breathing is regular and unlabored Skin is warm and dry without jaundice Decreased right upper extremity and scrotal/penile edema Alert and oriented  Weight change:    Intake/Output Summary (Last 24 hours) at 11/10/2022 1357 Last data filed at 11/10/2022 0600 Gross per 24 hour  Intake 539.23 ml  Output 625 ml  Net -85.77 ml   Net IO Since Admission: -1,589.03 mL [11/10/22 1357]  Labs, images, and other studies Potassium 3.3 Magnesium 1.7 Hemoglobin 11.1  MRI of trigeminal nerve shows possible interaction of small artery with superior aspect of nerve.  Assessment and plan Hospital day 2  [name] is a 62 y.o. with multiple sclerosis admitted for acute attack of trigeminal neuralgia complicated by dehydration and malnutrition.  Principal Problem:   Trigeminal neuralgia pain Active Problems:   Neurogenic bladder   Spastic quadriparesis (HCC)   Hypokalemia   Hyperbilirubinemia   Anemia  Trigeminal neuralgia Pain is under better control today.  Has received 2 lidocaine infusions and fosphenytoin infusion. Continuing home medications as well, in addition to PRN narcotics. IV fluids started yesterday.  Will continue IV fluids today as he is still unable to drink.  Discussed enteral feeding as a way to maintain nutrition and hydration through future neuralgia attacks.  This may be a good palliative measure for this patient and could preclude the need for future hospitalizations for IV fluid therapy. - Continue  lamotrigine and pregabalin  Nutrition Status: Nutrition Problem: Severe Malnutrition Etiology: chronic illness (trigeminal neuralgia) Signs/Symptoms: severe fat depletion, severe muscle depletion, percent weight loss Percent weight loss: 22 % (x 6 months) Interventions: Refer to RD note for recommendations  Hypokalemia Potassium 3.3 today.  Magnesium 1.7.  Tolerating oral tablets. - Potassium chloride 40 mEq p.o. 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 - Maintain urinary catheter   Pressure wounds Appreciate wound ostomy continence and their assistance with this case. - Supportive care   Depression - Escitalopram 10 mg [name]  Diet: Regular as tolerated IVF: LR 150 mL/h x 10 hours VTE: Enoxaparin Code: Partial, DO NOT INTUBATE Family Update: Wife at bedside  [name] MD 11/10/2022, 1:57 PM  Pager: [phone] After 5pm on weekdays and 1pm on weekends: [phone]

## 2022-11-10 NOTE — Progress Notes (Signed)
NEUROLOGY CONSULTATION PROGRESS NOTE   Date of service: November 10, 2022 Patient Name: [name] MRN:  [id number] DOB:  [birth year]  Brief HPI  [name] is a 62 y.o. male with PMH significant for MS c/b spastic quadriparesis and neurogenic bladder, chronic foley, wheelchair bound at home, classic migraines, right-side Trigeminal Neuralgia, who presents to the ED via EMS due to 3 days of severe trigeminal neuralgia pain that has stopped him from eating.   Interval Hx   Significantly improve with fosphenytoin x 1.  Vitals   Vitals:   11/09/22 1931 11/09/22 2354 11/10/22 0352 11/10/22 0714  BP: 105/72 109/70 109/69 96/68  Pulse: 77 74 80 84  Resp: 14 14 16 16   Temp: (!) 97.5 F (36.4 C) (!) 97.5 F (36.4 C) (!) 97.5 F (36.4 C) 97.6 F (36.4 C)  TempSrc: Oral Oral Oral Oral  SpO2: 100% 100% 100% 98%  Weight:      Height:         Body mass index is 25.98 kg/m.  Physical Exam   General: Laying comfortably in bed;nods head to answer questions, speaks softly. HENT: Normal external appearance of ears and nose. Unable to assess oropharynx 2/2 trigeminal neuralgia. Neck: Supple, no pain or tenderness  CV: No JVD. No peripheral edema.  Pulmonary: Symmetric Chest rise. Normal respiratory effort.  Abdomen: Soft to touch, non-tender.  Ext: No cyanosis, edema, or deformity  Skin: No rash. Normal palpation of skin.   Musculoskeletal: Normal digits and nails by inspection. No clubbing.   Neurologic Examination limited exam performed 2/2 concern for movement flaring up his trigeminal neuralgia.  Mental status/Cognition: Alert, nods his face to answer questions, speaks softly Speech/language: nods his face to answer questions, does not speak as it flares up his trigeminal neuralgia. Cranial nerves:   CN II Pupils equal and reactive to light, makes eye contact on left and right.   CN III,IV,VI Looks left and right. No obvious nystegmus   CN V normal sensation in V1, V2, and  V3 segments bilaterally   CN VII no asymmetry, no nasolabial fold flattening   CN VIII normal hearing to speech   CN IX & X Unable to assess   CN XI Head midline   CN XII Unable to assess,   Motor:  Muscle bulk: poor Moves BL upper extremities on command and able to give thumbs up. Does not wiggles toes on command. Chronically wheelchair dependent 2/2 MS.  Sensation:  Light touch Intact in all extremities.   Pin prick    Temperature    Vibration   Proprioception    Coordination/Complex Motor:  Unable to asess for concerns for causing a flare up of his trigeminal neurlagia.  Labs   Basic Metabolic Panel:  Lab Results  Component Value Date   NA 136 11/10/2022   K 3.3 (L) 11/10/2022   CO2 23 11/10/2022   GLUCOSE 78 11/10/2022   BUN 5 (L) 11/10/2022   CREATININE 0.71 11/10/2022   CALCIUM 8.4 (L) 11/10/2022   GFRNONAA >60 11/10/2022   GFRAA 84 07/29/2020   HbA1c:  Lab Results  Component Value Date   HGBA1C 5.4 08/14/2015   LDL:  Lab Results  Component Value Date   LDLCALC 163 (H) 08/14/2015   Urine Drug Screen: No results found for: "LABOPIA", "COCAINSCRNUR", "LABBENZ", "AMPHETMU", "THCU", "LABBARB"  Alcohol Level No results found for: "ETH" No results found for: "PHENYTOIN", "ZONISAMIDE", "LAMOTRIGINE", "LEVETIRACETA" No results found for: "PHENYTOIN", "PHENOBARB", "VALPROATE", "CBMZ"  Imaging  and Diagnostic studies   CT Head Wo Contrast 1. Stable atrophy pattern. 2. No acute intracranial abnormality by noncontrast CT. No interval change.  MRI FACE trigeminal w/wo C: 1. Possible interaction of a small basilar artery branch/pontine perforator with the superior aspect of the right trigeminal nerve. No other vascular structure in close proximity to the trigeminal nerves. 2. No acute intracranial process. 3. Redemonstrated demyelinating lesions, which appear unchanged compared to 07/07/2022.  Impression   [name] is a 62 y.o. male with PMH  significant for MS c/b spastic quadriparesis and neurogenic bladder, chronic foley, wheelchair bound at home, classic migraines, right-side Trigeminal Neuralgia, who presents to the ED via EMS due to 3 days of severe trigeminal neuralgia pain that has stopped him from eating and leading to failure to thrive.  Recommendations  - Lidocaine only offered temporary relief. - significantly improved with Fosphenytoin 1000mg  Iv once. - Lamotrigine 50mg  at 0800, 50mg  at 1200 and 100mg  at 2000 - Lyrica 75mg  at 0800, 1200 and 2000. - PRN opiods for breakthrough pain - MRI Trigeminal nerve with IAC protocol with possible interaction of a small perforator with R trigeminal nerve. I reached out to Dr. Kathyrn Sheriff with neurosurgery to get his opinion on imaging. He is to go into a case right now and will discuss with me later. ______________________________________________________________________   Thank you for the opportunity to take part in the care of this patient. If you have any further questions, please contact the neurology consultation attending.  Signed,  [place] Pager Number [phone]

## 2022-11-11 ENCOUNTER — Encounter (HOSPITAL_COMMUNITY): Payer: Self-pay | Admitting: Infectious Diseases

## 2022-11-11 DIAGNOSIS — G500 Trigeminal neuralgia: Secondary | ICD-10-CM | POA: Diagnosis not present

## 2022-11-11 LAB — CBC
HCT: 32.4 % — ABNORMAL LOW (ref 39.0–52.0)
Hemoglobin: 10.4 g/dL — ABNORMAL LOW (ref 13.0–17.0)
MCH: 31.2 pg (ref 26.0–34.0)
MCHC: 32.1 g/dL (ref 30.0–36.0)
MCV: 97.3 fL (ref 80.0–100.0)
Platelets: 169 10*3/uL (ref 150–400)
RBC: 3.33 MIL/uL — ABNORMAL LOW (ref 4.22–5.81)
RDW: 16.9 % — ABNORMAL HIGH (ref 11.5–15.5)
WBC: 6.3 10*3/uL (ref 4.0–10.5)
nRBC: 0 % (ref 0.0–0.2)

## 2022-11-11 LAB — BASIC METABOLIC PANEL
Anion gap: 10 (ref 5–15)
BUN: 5 mg/dL — ABNORMAL LOW (ref 6–20)
CO2: 24 mmol/L (ref 22–32)
Calcium: 8.3 mg/dL — ABNORMAL LOW (ref 8.9–10.3)
Chloride: 102 mmol/L (ref 98–111)
Creatinine, Ser: 0.77 mg/dL (ref 0.61–1.24)
GFR, Estimated: >60 mL/min (ref >60)
Glucose, Bld: 87 mg/dL (ref 70–99)
Potassium: 4 mmol/L (ref 3.5–5.1)
Sodium: 136 mmol/L (ref 135–145)

## 2022-11-11 LAB — PROTIME-INR
INR: 1 (ref 0.8–1.2)
Prothrombin Time: 13 seconds (ref 11.4–15.2)

## 2022-11-11 LAB — MAGNESIUM: Magnesium: 1.6 mg/dL — ABNORMAL LOW (ref 1.7–2.4)

## 2022-11-11 MED ORDER — BACLOFEN 10 MG PO TABS
10.0000 mg | ORAL_TABLET | Freq: Every day | ORAL | Status: DC | PRN
  Administered 2022-11-12: 10 mg via ORAL
  Filled 2022-11-11: qty 1

## 2022-11-11 MED ORDER — ENOXAPARIN SODIUM 40 MG/0.4ML IJ SOSY
40.0000 mg | PREFILLED_SYRINGE | INTRAMUSCULAR | Status: DC
  Administered 2022-11-11 – 2022-11-21 (×11): 40 mg via SUBCUTANEOUS
  Filled 2022-11-11 (×11): qty 0.4

## 2022-11-11 MED ORDER — LACTATED RINGERS IV SOLN: INTRAVENOUS | Status: AC

## 2022-11-11 MED ORDER — MAGNESIUM SULFATE 2 GM/50ML IV SOLN
2.0000 g | Freq: Once | INTRAVENOUS | Status: AC
  Administered 2022-11-11: 2 g via INTRAVENOUS
  Filled 2022-11-11: qty 50

## 2022-11-11 NOTE — Consult Note (Signed)
Consult Note  [name] [birth year]  [id number].    Requesting MD: Dr. Evette Doffing Chief Complaint/Reason for Consult: gastrostomy tube placement  HPI:  62 y.o. male with medical history significant for trigeminal neuralgia, MS with quadriplegia, pressure wounds, depression who presented to [facility] ED with facial pain. He had been having several days of facial pain so severe that he was unable to eat or drink. He was admitted to the internal medicine teaching service for treatment of trigeminal neurologia. This is not his first episode and symptoms have been ongoing for months. Neurology consulted as well. He is able to tolerate regular food, but liquids are a trigger for pain from his trigeminal neuralgia. Wanted to discuss possibility of a gastrostomy tube mostly for administration of fluids  He has had no prior abdominal surgeries and denies substance use.  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 He has never used smokeless tobacco. He reports that he does not drink alcohol and does not use drugs.  Allergies:  Allergies  Allergen Reactions   Gadopiclenol Nausea And Vomiting     Medications Prior to Admission  Medication Sig Dispense Refill   acetaminophen (TYLENOL) 500 MG tablet Take 1,000 mg by mouth every 6 (six) hours as needed for moderate pain.     albuterol (VENTOLIN HFA) 108 (90 Base) MCG/ACT inhaler Inhale 2 puffs into the lungs every 6 (six) hours as needed. (Patient taking differently: Inhale 1-2 puffs into the lungs every 6 (six) hours as needed for wheezing.) 1 each 3   baclofen (LIORESAL) 10 MG tablet Take 10 mg by mouth daily as needed for muscle spasms.     Carboxymethylcellulose Sod PF (THERATEARS) 1 % GEL Place 1 drop into both eyes daily as needed (dry eyes).     Dextromethorphan-Quinidine 20-10 MG CAPS Take 1 tablet by mouth 2 (two) times daily. (Patient taking differently: Take 1 tablet by mouth daily.) 60 capsule 3   escitalopram (LEXAPRO) 10 MG tablet Take 10 mg by mouth daily.     gabapentin (NEURONTIN) 100 MG capsule Take 100 mg by mouth daily as needed (nerve pain).     ibuprofen (ADVIL) 200 MG tablet Take 600 mg by mouth every 6 (six) hours as needed for headache or moderate pain.     [START ON 11/18/2022] lamoTRIgine (LAMICTAL) 100 MG tablet Take 1 tablet (100 mg total) by mouth daily for 7 days. (Patient taking differently:  Take 50 mg by mouth 3 (three) times daily.) 7 tablet 0   leptospermum manuka honey (MEDIHONEY) PSTE paste Apply 1 Application topically daily. 15 mL 0   modafinil (PROVIGIL) 100 MG tablet TAKE 1 TABLET (100 MG TOTAL) BY MOUTH IN THE MORNING. *SENT FOR PA* (Patient taking differently: Take 100 mg by mouth daily.) 90 tablet 1   ocrelizumab 600 mg in sodium chloride 0.9 % 500 mL Inject 600 mg into the vein every 6 (six) months.      oxyCODONE-acetaminophen (PERCOCET) 5-325 MG tablet Take 1 tablet by mouth every 6 (six) hours as needed for severe pain. 60 tablet 0   potassium chloride SA (KLOR-CON M) 20 MEQ tablet Take 2 tablets (40 mEq total) by mouth daily. (Patient taking differently: Take 20 mEq by mouth 2 (two) times  daily.) 60 tablet 0   pregabalin (LYRICA) 75 MG capsule Take 1 capsule (75 mg total) by mouth 3 (three) times daily. 90 capsule 0   acetaminophen (TYLENOL) 325 MG tablet Take 2 tablets (650 mg total) by mouth every 6 (six) hours as needed for mild pain, fever or moderate pain (or Fever >/= 101). (Patient not taking: Reported on 11/07/2022) 30 tablet 0   lamoTRIgine (LAMICTAL) 25 MG tablet Take 2 tablets (50 mg total) by mouth daily for 14 days. (Patient not taking: Reported on 11/07/2022) 28 tablet 0   lamoTRIgine (LAMICTAL) 25 MG tablet Take 1 tablet (25 mg total) by mouth daily for 10 days. (Patient not taking: Reported on 11/07/2022) 10 tablet 0   ondansetron (ZOFRAN-ODT) 4 MG disintegrating tablet 4mg  ODT q4 hours prn nausea/vomit (Patient not taking: Reported on 08/19/2022) 6 tablet 0    Blood pressure 93/64, pulse 93, temperature 97.6 F (36.4 C), temperature source Oral, resp. rate 16, height 5\' 9"  (1.753 m), weight 79.8 kg, SpO2 98 %. Physical Exam: General: pleasant, WD, male who is laying in bed in NAD HEENT: head is normocephalic, atraumatic.  Sclera are noninjected.  Pupils equal and round. EOMs intact.  Ears and nose without any masses or lesions.  Mouth is pink and moist Heart: regular, rate, and rhythm Lungs: Respiratory effort nonlabored on room air Abd: soft, NT, ND, +BS, no masses, hernias, or organomegaly Psych: A&Ox3 with an appropriate affect.    Results for orders placed or performed during the hospital encounter of 11/07/22 (from the past 48 hour(s))  Basic metabolic panel     Status: Abnormal   Collection Time: 11/10/22  6:26 AM  Result Value Ref Range   Sodium 136 135 - 145 mmol/L   Potassium 3.3 (L) 3.5 - 5.1 mmol/L   Chloride 97 (L) 98 - 111 mmol/L   CO2 23 22 - 32 mmol/L   Glucose, Bld 78 70 - 99 mg/dL    Comment: Glucose reference range applies only to samples taken after fasting for at least 8 hours.   BUN 5 (L) 6 - 20 mg/dL   Creatinine, Ser 11/09/22 0.61 - 1.24  mg/dL   Calcium 8.4 (L) 8.9 - 10.3 mg/dL   GFR, Estimated 11/12/22 6.07 mL/min    Comment: (NOTE) Calculated using the CKD-EPI Creatinine Equation (2021)    Anion gap 16 (H) 5 - 15    Comment: Performed at [facility], [address], [place], [address] [place]  CBC     Status: Abnormal   Collection Time: 11/10/22  6:26 AM  Result Value Ref Range   WBC 7.6 4.0 - 10.5 K/uL   RBC  3.55 (L) 4.22 - 5.81 MIL/uL   Hemoglobin 11.1 (L) 13.0 - 17.0 g/dL   HCT 34.5 (L) 39.0 - 52.0 %   MCV 97.2 80.0 - 100.0 fL   MCH 31.3 26.0 - 34.0 pg   MCHC 32.2 30.0 - 36.0 g/dL   RDW 16.5 (H) 11.5 - 15.5 %   Platelets 175 150 - 400 K/uL   nRBC 0.0 0.0 - 0.2 %    Comment: Performed at [facility] [address] [place], [place] [address]  Magnesium     Status: None   Collection Time: 11/10/22  6:26 AM  Result Value Ref Range   Magnesium 1.7 1.7 - 2.4 mg/dL    Comment: Performed at [facility] [address] [place], [place] [address]  Basic metabolic panel     Status: Abnormal   Collection Time: 11/11/22  6:06 AM  Result Value Ref Range   Sodium 136 135 - 145 mmol/L   Potassium 4.0 3.5 - 5.1 mmol/L   Chloride 102 98 - 111 mmol/L   CO2 24 22 - 32 mmol/L   Glucose, Bld 87 70 - 99 mg/dL    Comment: Glucose reference range applies only to samples taken after fasting for at least 8 hours.   BUN 5 (L) 6 - 20 mg/dL   Creatinine, Ser 0.77 0.61 - 1.24 mg/dL   Calcium 8.3 (L) 8.9 - 10.3 mg/dL   GFR, Estimated >60 >60 mL/min    Comment: (NOTE) Calculated using the CKD-EPI Creatinine Equation (2021)    Anion gap 10 5 - 15    Comment: Performed at [facility] [address], [place], [place] [address]  Magnesium     Status: Abnormal   Collection Time: 11/11/22  6:06 AM  Result Value Ref Range   Magnesium 1.6 (L) 1.7 - 2.4 mg/dL    Comment: Performed at [facility] [address] [place], [place] [address]  CBC     Status: Abnormal   Collection Time: 11/11/22  6:06 AM   Result Value Ref Range   WBC 6.3 4.0 - 10.5 K/uL   RBC 3.33 (L) 4.22 - 5.81 MIL/uL   Hemoglobin 10.4 (L) 13.0 - 17.0 g/dL   HCT 32.4 (L) 39.0 - 52.0 %   MCV 97.3 80.0 - 100.0 fL   MCH 31.2 26.0 - 34.0 pg   MCHC 32.1 30.0 - 36.0 g/dL   RDW 16.9 (H) 11.5 - 15.5 %   Platelets 169 150 - 400 K/uL   nRBC 0.0 0.0 - 0.2 %    Comment: Performed at [facility], [place] [address], [place], [place] [address]   MR FACE/TRIGEMINAL WO/W CM  Result Date: 11/09/2022 CLINICAL DATA:  Trigeminal neuralgia, concern for vascular loop on the right EXAM: MRI FACE TRIGEMINAL WITHOUT AND WITH CONTRAST TECHNIQUE: Multiplanar, multi-echo pulse sequences of the face and surrounding structures, including thin-slice imaging of the trigeminal nerves, were acquired before and after intravenous contrast administration. CONTRAST:  25mL GADAVIST GADOBUTROL 1 MMOL/ML IV SOLN COMPARISON:  07/07/2022 MRI head FINDINGS: Evaluation is somewhat limited by motion artifact. Limited intracranial/Trigeminal nerves: No visible acute infarct, hemorrhage, hydrocephalus, extra-axial collection or mass lesion. Specifically, no mass is identified along the course of the trigeminal nerves on thin-slice imaging. Possible interaction of a small basilar artery branch/pontine perforator with the superior aspect of the right trigeminal (series 6, image 11 and series 8, images 120-130). No other vascular branch in close  proximity to the trigeminal nerves. Redemonstrated periventricular white matter hyperintensities, which appear unchanged compared to 07/07/2022. Given differences in protocol, additional T2 hyperintensities in the thalami, pons, cerebellum, and medulla also appears stable, Vascular: Normal flow voids. Sinuses/Orbits: Overall clear paranasal sinuses. No acute finding in the orbits. Soft tissues: Normal. Osseous: Normal marrow signal without focal lesion. Other: None. IMPRESSION: 1. Possible interaction of a small basilar artery  branch/pontine perforator with the superior aspect of the right trigeminal nerve. No other vascular structure in close proximity to the trigeminal nerves. 2. No acute intracranial process. 3. Redemonstrated demyelinating lesions, which appear unchanged compared to 07/07/2022. Electronically Signed   By: Merilyn Baba M.D.   On: 11/09/2022 18:00      Assessment/Plan Trigeminal neurologia Intolerance of PO liquids due to pain Surgical gastrostomy tube placement  Patient seen and examined and relevant labs and imaging reviewed. General surgery consulted for evaluation for surgical G tube placement in setting of PO liquids intolerance with trigeminal neurologia. IR evaluated and no window for percutaneous placement.   Discussed the procedure with the patient and his wife which would include general anesthesia and an open abdominal procedure given that his anatomy would not allow for IR placement or PEG. Since he is tolerating solids could also consider alternative route IV as surgery entails operative risks and g tube would need to remain at least 6 weeks before potential removal. When risks/benefits of surgical g tube discussed patient and his wife they do not wish to proceed with this at this time which I think is very reasonable. I discussed this with the primary team. Consider TOC consult to look into other home care options for fluids. We will sign off, please call with any questions or concerns.   FEN: regular ID: none VTE: okay for chemical prophylaxis from surgical standpoint   I reviewed last 24 h vitals and pain scores and last 24 h imaging results.   Margie Billet, [facility] Surgery 11/11/2022, 10:53 AM Please see Amion for pager number during day hours 7:00am-4:30pm

## 2022-11-11 NOTE — Care Management Important Message (Signed)
Important Message  Patient Details  Name: [name] MRN: [id number] Date of Birth: Jul 30, 1961   Medicare Important Message Given:  Yes     [name] 11/11/2022, 1:46 PM

## 2022-11-11 NOTE — Progress Notes (Signed)
I briefly spoke with patient and his wife. He is much better and tolerating solids. Liquids still trigger his trigeminal neuralgia. Happy with the medication timing and only required 1 dose of percocet overnight.  They understand the need for feeding tube but wife worries that he may stop eating once he has the tube in. She will continue to encourage I'm to eat with his mouth.  Overall, they are happy with pain control and look forward to feeding tube placement.  At this time, we will signoff. I spoke with patient and with wife and discussed that either them or the primary team can seek our help at anytime be asking neurology to be re-engaged. Please feel free to re-engage [place] for any questions or concerns.  [place] Pager Number [phone]

## 2022-11-11 NOTE — Progress Notes (Addendum)
Interval history Still has severe pain. Drinking fluids is still a very severe pain trigger. He has not been able to take a significant amount of fluids by mouth since yesterday. Eating is going well. He would like to be evaluated for placement of a gastrostomy tube.  Physical exam Blood pressure 96/68, pulse 84, temperature (!) 97.5 F (36.4 C), temperature source Oral, resp. rate 17, height 5\' 9"  (1.753 m), weight 79.8 kg, SpO2 100 %.  Uncomfortable appearing Breathing is regular and unlabored Abdomen is soft, non-tender, and non-distended Skin is warm and dry Alert and oriented, speech is slow  Labs, images, and other studies Potassium 4.0 Magnesium 1.6 Hemoglobin 10.4  Assessment and plan Hospital day [name] is a 62 y.o. with multiple sclerosis admitted for trigeminal neuralgia complicated by dehydration and malnutrition.  Principal Problem:   Trigeminal neuralgia pain Active Problems:   Neurogenic bladder   Spastic quadriparesis (HCC)   Hypokalemia   Hyperbilirubinemia   Anemia  Trigeminal neuralgia Pain is still severe.  Oral liquids are an unbearable pain trigger.  Eating is going well.  Continuing IV fluids to prevent dehydration. - Lamotrigine 200 mg p.o. daily in divided doses - Pregabalin 75 mg p.o. 3 times daily - LR IV infusion at 75 mL/h  Nutrition Status: Severe malnutrition complicated by electrolyte derangements and edema.  This patient is interested in proceeding with evaluation for the placement of a gastrostomy tube to prevent severe malnutrition and dehydration secondary to pain from trigeminal neuralgia. Nutrition Problem: Severe Malnutrition Etiology: chronic illness (trigeminal neuralgia) Signs/Symptoms: severe fat depletion, severe muscle depletion, percent weight loss Percent weight loss: 22 % (x 6 months) Interventions: Refer to RD note for recommendations  Electrolyte abnormalities Magnesium 1.6 today.   Potassium within normal limits. 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 - Maintain urinary catheter   Pressure wounds Appreciate wound ostomy continence and their assistance with this case. - Supportive care   Depression - Escitalopram 10 mg daily  Diet: N.p.o. IVF: LR 75 mL/h VTE: Enoxaparin 40 mg Code: Limited - DO NOT INTUBATE Family Update: Wife at bedside  Discharge plan: Pending return of ability to eat and drink   [name] MD 11/11/2022, 6:18 AM  Pager: [phone] After 5pm on weekdays and 1pm on weekends: [phone]

## 2022-11-12 DIAGNOSIS — E46 Unspecified protein-calorie malnutrition: Secondary | ICD-10-CM

## 2022-11-12 DIAGNOSIS — G500 Trigeminal neuralgia: Secondary | ICD-10-CM | POA: Diagnosis not present

## 2022-11-12 LAB — BASIC METABOLIC PANEL
Anion gap: 8 (ref 5–15)
BUN: 5 mg/dL — ABNORMAL LOW (ref 6–20)
CO2: 26 mmol/L (ref 22–32)
Calcium: 8.2 mg/dL — ABNORMAL LOW (ref 8.9–10.3)
Chloride: 102 mmol/L (ref 98–111)
Creatinine, Ser: 0.63 mg/dL (ref 0.61–1.24)
GFR, Estimated: >60 mL/min (ref >60)
Glucose, Bld: 91 mg/dL (ref 70–99)
Potassium: 3.5 mmol/L (ref 3.5–5.1)
Sodium: 136 mmol/L (ref 135–145)

## 2022-11-12 LAB — PHOSPHORUS: Phosphorus: 2.7 mg/dL (ref 2.5–4.6)

## 2022-11-12 LAB — MAGNESIUM: Magnesium: 2 mg/dL (ref 1.7–2.4)

## 2022-11-12 MED ORDER — LACTATED RINGERS IV SOLN: INTRAVENOUS | Status: AC

## 2022-11-12 MED ORDER — LAMOTRIGINE 100 MG PO TABS
100.0000 mg | ORAL_TABLET | Freq: Two times a day (BID) | ORAL | Status: AC
  Administered 2022-11-13 – 2022-11-18 (×12): 100 mg via ORAL
  Filled 2022-11-12 (×12): qty 1

## 2022-11-12 MED ORDER — OXYCODONE-ACETAMINOPHEN 5-325 MG PO TABS
1.0000 | ORAL_TABLET | ORAL | Status: DC | PRN
  Administered 2022-11-13 – 2022-11-21 (×22): 1 via ORAL
  Filled 2022-11-12 (×23): qty 1

## 2022-11-12 MED ORDER — LAMOTRIGINE 25 MG PO TABS
50.0000 mg | ORAL_TABLET | Freq: Every day | ORAL | Status: DC
  Administered 2022-11-14 – 2022-11-18 (×5): 50 mg via ORAL
  Filled 2022-11-12 (×6): qty 2

## 2022-11-12 MED ORDER — SODIUM CHLORIDE 0.9 % IV SOLN
500.0000 mg | Freq: Once | INTRAVENOUS | Status: AC
  Administered 2022-11-12: 500 mg via INTRAVENOUS
  Filled 2022-11-12: qty 10

## 2022-11-12 NOTE — TOC Progression Note (Signed)
Transition of Care [facility]) - Progression Note    Patient Details  Name: [name] MRN: [id number] Date of Birth: 06-10-1961  Transition of Care [facility]) CM/SW Contact  Pollie Friar, RN Phone Number: 11/12/2022, 3:42 PM  Clinical Narrative:    TOC following for d/c needs.         Expected Discharge Plan and Services                                               Social Determinants of Health (SDOH) Interventions SDOH Screenings   Food Insecurity: No Food Insecurity (11/07/2022)  Housing: Low Risk  (11/07/2022)  Transportation Needs: No Transportation Needs (11/07/2022)  Utilities: Not At Risk (11/07/2022)  Depression (PHQ2-9): Low Risk  (07/04/2020)  Tobacco Use: Medium Risk (11/11/2022)    Readmission Risk Interventions    08/21/2022    1:23 PM  Readmission Risk Prevention Plan  Post Dischage Appt Complete  Medication Screening Complete  Transportation Screening Complete

## 2022-11-12 NOTE — Progress Notes (Signed)
Interventional Radiology Brief Note:  IR consulted for percutaneous gastrostomy tube placement in patient with poor PO intake dur to trigeminal neuralgia.  CT Abdomen Pelvis from 10/19/22 reviewed by Dr. Serafina Royals who notes there is no percutaneous window to the decompressed stomach.  Surgery was also consulted and discussed possible surgically-placed gastrostomy tube with patient and his wife who decided not to proceed.    No plans for percutaneous gastrostomy tube placement in IR, favor surgical placement if needed.   Brynda Greathouse, MS RD PA-C

## 2022-11-12 NOTE — Progress Notes (Signed)
NEUROLOGY CONSULTATION PROGRESS NOTE   Date of service: November 12, 2022 Patient Name: [name] MRN:  [id number] DOB:  04-02-1961  Brief HPI  [name] is a 62 y.o. male with PMH significant for MS c/b spastic quadriparesis and neurogenic bladder, chronic foley, wheelchair bound at home, classic migraines, right-side Trigeminal Neuralgia, who presents to the ED via EMS due to 3 days of severe trigeminal neuralgia pain that has stopped him from eating.   Interval Hx   Significantly improve with fosphenytoin x 1 on 1/29. Will do one more dose 500mg  at 2000 and increase lamotrigine today. Sitting up in bed and was able to eat a little today.   Vitals   Vitals:   11/11/22 2023 11/12/22 0027 11/12/22 0325 11/12/22 0756  BP: 95/64 (!) 85/63 98/67 106/67  Pulse: 83 84 81 77  Resp: 16 17 17 12   Temp: 97.7 F (36.5 C) (!) 97.4 F (36.3 C) (!) 97.5 F (36.4 C) 97.7 F (36.5 C)  TempSrc: Oral Oral Oral Oral  SpO2: 100% 97% 100% 100%  Weight:      Height:         Body mass index is 25.98 kg/m.  Physical Exam   General: Laying comfortably in bed;nods head to answer questions, speaks softly. HENT: Normal external appearance of ears and nose. Unable to assess oropharynx 2/2 trigeminal neuralgia. Neck: Supple, no pain or tenderness  CV: No JVD. No peripheral edema.  Pulmonary: Symmetric Chest rise. Normal respiratory effort.  Abdomen: Soft to touch, non-tender.  Ext: No cyanosis, edema, or deformity  Skin: No rash. Normal palpation of skin.   Musculoskeletal: Normal digits and nails by inspection. No clubbing.   Neurologic Examination limited exam performed 2/2 concern for movement flaring up his trigeminal neuralgia.  Mental status/Cognition: Alert, nods his face to answer questions, speaks softly Speech/language: nods his face to answer questions, does not speak as it flares up his trigeminal neuralgia. Cranial nerves:   CN II Pupils equal and reactive to light, makes  eye contact on left and right.   CN III,IV,VI Looks left and right. No obvious nystegmus   CN V normal sensation in V1, V2, and V3 segments bilaterally   CN VII no asymmetry, no nasolabial fold flattening   CN VIII normal hearing to speech   CN IX & X Unable to assess   CN XI Head midline   CN XII Unable to assess,   Motor:  Muscle bulk: poor Moves BL upper extremities on command and able to give thumbs up. Does not wiggles toes on command. Chronically wheelchair dependent 2/2 MS.  Sensation:  Light touch Intact in all extremities.   Pin prick    Temperature    Vibration   Proprioception    Coordination/Complex Motor:  Unable to asess for concerns for causing a flare up of his trigeminal neurlagia.  Labs   Basic Metabolic Panel:  Lab Results  Component Value Date   NA 136 11/12/2022   K 3.5 11/12/2022   CO2 26 11/12/2022   GLUCOSE 91 11/12/2022   BUN 5 (L) 11/12/2022   CREATININE 0.63 11/12/2022   CALCIUM 8.2 (L) 11/12/2022   GFRNONAA >60 11/12/2022   GFRAA 84 07/29/2020   HbA1c:  Lab Results  Component Value Date   HGBA1C 5.4 08/14/2015   LDL:  Lab Results  Component Value Date   LDLCALC 163 (H) 08/14/2015   Urine Drug Screen: No results found for: "LABOPIA", "COCAINSCRNUR", "LABBENZ", "AMPHETMU", "THCU", "LABBARB"  Alcohol Level No results found for: "ETH" No results found for: "PHENYTOIN", "ZONISAMIDE", "LAMOTRIGINE", "LEVETIRACETA" No results found for: "PHENYTOIN", "PHENOBARB", "VALPROATE", "CBMZ"  Imaging and Diagnostic studies   CT Head Wo Contrast 1. Stable atrophy pattern. 2. No acute intracranial abnormality by noncontrast CT. No interval change.  MRI FACE trigeminal w/wo C: 1. Possible interaction of a small basilar artery branch/pontine perforator with the superior aspect of the right trigeminal nerve. No other vascular structure in close proximity to the trigeminal nerves. 2. No acute intracranial process. 3. Redemonstrated demyelinating  lesions, which appear unchanged compared to 07/07/2022.  Impression   [name] is a 62 y.o. male with PMH significant for MS c/b spastic quadriparesis and neurogenic bladder, chronic foley, wheelchair bound at home, classic migraines, right-side Trigeminal Neuralgia, who presents to the ED via EMS due to 3 days of severe trigeminal neuralgia pain that has stopped him from eating and leading to failure to thrive.  Recommendations  - Lidocaine only offered temporary relief. - Will do Fosphenytoin 500mg  at 2000 tonight - Increase Lamotrigine 50mg  at 0800, 100mg  at 1200 and 100mg  at 2000 - Lyrica 75mg  at 0800, 1200 and 2000. - PRN opiods for breakthrough pain  ______________________________________________________________________   Patient seen and examined by NP/APP with MD. MD to update note as needed.   Janine Ores, DNP, FNP-BC [facility] Pager: [phone]   NEUROHOSPITALIST ADDENDUM Performed a face to face diagnostic evaluation.   I have reviewed the contents of history and physical exam as documented by PA/ARNP/Resident and agree with above documentation.  I have discussed and formulated the above plan as documented. Edits to the note have been made as needed.  Impression/Key exam findings/Plan: Spooke with patient and wife at bedside today. They report that he has been eating more and more. Unfortunately, unable to get percutaneous feeding tube due to anatomic variation and will require surgery which they are hesitant and declined.  Liquids still triggering trigeminal neuralgia, despite tiling head to the left and sipping on the left side only.  Given lack of medication options and debilitating atacks, will do another small 500mg  dose of IV Fosphenytoin at 2000 to coincide with his sleep schedule and then increase his lamictal as above starting tomorrow.  Donnetta Simpers, MD [facility] [phone]   If 7pm to 7am, please call on call  as listed on AMION.

## 2022-11-12 NOTE — Progress Notes (Addendum)
Interval history No major changes since yesterday.  Pain is still severe.  Preventing him from drinking fluids.  He is able to eat solid foods.  Discussed gastrostomy tube, he and his wife are disappointed that he is not a good candidate for this procedure.  He does not want to undergo an open abdominal surgery for placement of a feeding tube.  Physical exam Blood pressure (!) 86/57, pulse 92, temperature 98.4 F (36.9 C), temperature source Oral, resp. rate 17, height 5\' 9"  (1.753 m), weight 79.8 kg, SpO2 96 %.  Uncomfortable appearing Breathing is regular and unlabored Abdomen is soft, non-tender, and non-distended Skin is warm and dry Alert and oriented, speech is slow  Labs, images, and other studies Potassium 3.5 Magnesium 2.0  Assessment and plan Hospital day [place] is a 62 y.o. with multiple sclerosis admitted for trigeminal neuralgia complicated by dehydration and malnutrition  Principal Problem:   Trigeminal neuralgia pain Active Problems:   Neurogenic bladder   Spastic quadriparesis (HCC)   Hypokalemia   Anemia  Trigeminal neuralgia Malnutrition Still not able to take fluids by mouth.  Unfortunately his anatomy prevents easy percutaneous gastrostomy tube placement.  Continuing IV fluids.  Neurology asked to reevaluate. - Increase lamotrigine to 250 mg daily in divided doses - Pregabalin 75 mg p.o. 3 times daily - Fosphenytoin infusion tonight - Continue LR infusion at 75 mL/h  Nutrition Status: Nutrition Problem: Severe Malnutrition Etiology: chronic illness (trigeminal neuralgia) Signs/Symptoms: severe fat depletion, severe muscle depletion, percent weight loss Percent weight loss: 22 % (x 6 months) Interventions: Refer to RD note for recommendations  Electrolyte abnormalities Potassium, magnesium, and phosphorus within normal limits today.  Multiple sclerosis Complicated by spastic quadriplegia and urinary retention. -  Maintain Foley catheter  Pressure wounds Wife has concern for developing pressure injury. - Supportive care  Depression - Escitalopram 10 mg daily  Diet: Regular IVF: LR 75 mL/h VTE: enoxaparin 40 mg Code: Limited - DO NOT INTUBATE Family Update: Wife at bedside  Discharge plan: Pending ability to hydrate without IV fluids   [name] MD 11/12/2022, 3:27 PM  Pager: [phone] After 5pm on weekdays and 1pm on weekends: [phone]

## 2022-11-13 DIAGNOSIS — E46 Unspecified protein-calorie malnutrition: Secondary | ICD-10-CM | POA: Diagnosis not present

## 2022-11-13 DIAGNOSIS — G500 Trigeminal neuralgia: Secondary | ICD-10-CM | POA: Diagnosis not present

## 2022-11-13 LAB — BASIC METABOLIC PANEL
Anion gap: 7 (ref 5–15)
BUN: 10 mg/dL (ref 6–20)
CO2: 29 mmol/L (ref 22–32)
Calcium: 8.4 mg/dL — ABNORMAL LOW (ref 8.9–10.3)
Chloride: 103 mmol/L (ref 98–111)
Creatinine, Ser: 0.62 mg/dL (ref 0.61–1.24)
GFR, Estimated: >60 mL/min (ref >60)
Glucose, Bld: 96 mg/dL (ref 70–99)
Potassium: 3.6 mmol/L (ref 3.5–5.1)
Sodium: 139 mmol/L (ref 135–145)

## 2022-11-13 LAB — PHENYTOIN LEVEL, TOTAL: Phenytoin Lvl: 10.5 ug/mL (ref 10.0–20.0)

## 2022-11-13 MED ORDER — SODIUM CHLORIDE 0.9 % IV SOLN
1000.0000 mg | Freq: Once | INTRAVENOUS | Status: AC
  Administered 2022-11-13: 1000 mg via INTRAVENOUS
  Filled 2022-11-13: qty 20

## 2022-11-13 NOTE — Progress Notes (Signed)
                 Interval history Fosphenytoin had little to no beneficial effect on this patient's pain.  Still unable to take fluids by mouth.  He is able to take thicker substances like applesauce without severe pain.  Physical exam Blood pressure 92/65, pulse 91, temperature 98.2 F (36.8 C), temperature source Oral, resp. rate 17, height 5\' 9"  (1.753 m), weight 45.2 kg, SpO2 95 %.  Comfortable appearing Breathing is regular and unlabored Skin is warm and dry Alert and oriented, speech is slow  Assessment and plan Hospital day [place] is a 62 y.o. with multiple sclerosis admitted for trigeminal neuralgia complicated by dehydration and malnutrition.  Principal Problem:   Trigeminal neuralgia pain Active Problems:   Neurogenic bladder   Spastic quadriparesis (HCC)   Hypokalemia   Anemia  Trigeminal neuralgia Malnutrition Fosphenytoin infusion ineffective.  As he is able to take semisolids like applesauce, we will try thickening his liquids to see if this makes them tolerable.  In the meantime I will [facility] to gauge their willingness to take on management of outpatient IV fluid hydration.  Appreciate neurology assistance with this case. - Anticipate repeat fosphenytoin infusion this evening - Lamotrigine 250 mg daily - Pregabalin 75 mg 3 times daily - Oxycodone-acetaminophen 5-325 mg every 4 hours as needed for severe pain  Nutrition Status: Nutrition Problem: Severe Malnutrition Etiology: chronic illness (trigeminal neuralgia) Signs/Symptoms: severe fat depletion, severe muscle depletion, percent weight loss Percent weight loss: 22 % (x 6 months) Interventions: Refer to RD note for recommendations  Electrolyte abnormalities Potassium within normal limits today.  Multiple sclerosis Complicated by spastic quadriplegia and urinary retention. - Maintain Foley catheter   Pressure wounds - Supportive care   Depression -  Escitalopram 10 mg daily  Diet: Regular with nectar thick liquids IVF: LR 75 mL/h VTE: Enoxaparin 40 mg Code: Limited - DO NOT INTUBATE Family Update: Wife at bedside  Discharge plan: Pending ability to hydrate without IV fluids   [name] MD 11/13/2022, 6:32 AM  Pager: [phone] After 5pm on weekdays and 1pm on weekends: [phone]

## 2022-11-13 NOTE — Progress Notes (Signed)
Handoff report received from [place], [place]. Assuming care of this patient now.

## 2022-11-13 NOTE — Plan of Care (Signed)

## 2022-11-13 NOTE — Plan of Care (Signed)
  Problem: Activity: Goal: Risk for activity intolerance will decrease Outcome: Progressing   Problem: Nutrition: Goal: Adequate nutrition will be maintained Outcome: Progressing   Problem: Coping: Goal: Level of anxiety will decrease Outcome: Progressing   Problem: Safety: Goal: Ability to remain free from injury will improve Outcome: Progressing   

## 2022-11-13 NOTE — Progress Notes (Signed)
NEUROLOGY CONSULTATION PROGRESS NOTE   Date of service: November 13, 2022 Patient Name: [name] MRN:  [id number] DOB:  1961/07/12  Brief HPI  [name] is a 62 y.o. male with PMH significant for MS c/b spastic quadriparesis and neurogenic bladder, chronic foley, wheelchair bound at home, classic migraines, right-side Trigeminal Neuralgia, who presents to the ED via EMS due to 3 days of severe trigeminal neuralgia pain that has stopped him from eating.   Interval Hx   Patient rates trigeminal neuralgia pain 7 out of 10 today.  He states he did not have any improvement from last night's dose of fosphenytoin, although the previous dose did help.  He has been able to eat but still has significant difficulties with drinking and is going to be unable to maintain adequate oral intake of fluids.  He avoids speaking due to this aggravating pain from trigeminal neuralgia.  Vitals   Vitals:   11/12/22 2010 11/13/22 0043 11/13/22 0316 11/13/22 0500  BP: (!) 94/59 (!) 96/59 92/65   Pulse: 92 90 91   Resp: 17 17 17    Temp: 98.3 F (36.8 C) 98.7 F (37.1 C) 98.2 F (36.8 C)   TempSrc: Oral Oral Oral   SpO2: 98% 95% 95%   Weight:    45.2 kg  Height:         Body mass index is 14.72 kg/m.  Physical Exam   General: Laying comfortably in bed;nods head to answer questions, speaks softly. HENT: Normal external appearance of ears and nose. Unable to assess oropharynx 2/2 trigeminal neuralgia. Neck: Supple, no pain or tenderness  CV: No JVD. No peripheral edema.  Pulmonary: Symmetric Chest rise. Normal respiratory effort.  Abdomen: Soft to touch, non-tender.  Ext: No cyanosis, edema, or deformity  Skin: No rash. Normal palpation of skin.   Musculoskeletal: Normal digits and nails by inspection. No clubbing.   Neurologic Examination limited exam performed 2/2 concern for movement flaring up his trigeminal neuralgia.  Mental status/Cognition: Alert, nods his face to answer questions,  speaks softly Speech/language: nods his face to answer questions, does not speak as it flares up his trigeminal neuralgia. Cranial nerves:   CN II Pupils equal and reactive to light, makes eye contact on left and right.   CN III,IV,VI Looks left and right. No obvious nystegmus   CN V normal sensation in V1, V2, and V3 segments bilaterally   CN VII no asymmetry, no nasolabial fold flattening   CN VIII normal hearing to speech   CN IX & X Unable to assess   CN XI Head midline   CN XII Tongue midline   Motor:  Muscle bulk: poor Moves left upper extremity on command, unable to move right upper extremity, states that this is normal for him Does not wiggles toes on command, wife states he is able to do this intermittently. Chronically wheelchair dependent 2/2 MS.  Sensation:  Light touch Intact in all extremities.   Pin prick    Temperature    Vibration   Proprioception    Coordination/Complex Motor:  Unable to asess for concerns for causing a flare up of his trigeminal neurlagia.  Labs   Basic Metabolic Panel:  Lab Results  Component Value Date   NA 139 11/13/2022   K 3.6 11/13/2022   CO2 29 11/13/2022   GLUCOSE 96 11/13/2022   BUN 10 11/13/2022   CREATININE 0.62 11/13/2022   CALCIUM 8.4 (L) 11/13/2022   GFRNONAA >60 11/13/2022   GFRAA  84 07/29/2020   HbA1c:  Lab Results  Component Value Date   HGBA1C 5.4 08/14/2015   LDL:  Lab Results  Component Value Date   LDLCALC 163 (H) 08/14/2015   Urine Drug Screen: No results found for: "LABOPIA", "COCAINSCRNUR", "LABBENZ", "AMPHETMU", "THCU", "LABBARB"  Alcohol Level No results found for: "ETH" No results found for: "PHENYTOIN", "ZONISAMIDE", "LAMOTRIGINE", "LEVETIRACETA" No results found for: "PHENYTOIN", "PHENOBARB", "VALPROATE", "CBMZ"  Imaging and Diagnostic studies   CT Head Wo Contrast 1. Stable atrophy pattern. 2. No acute intracranial abnormality by noncontrast CT. No interval change.  MRI FACE trigeminal  w/wo C: 1. Possible interaction of a small basilar artery branch/pontine perforator with the superior aspect of the right trigeminal nerve. No other vascular structure in close proximity to the trigeminal nerves. 2. No acute intracranial process. 3. Redemonstrated demyelinating lesions, which appear unchanged compared to 07/07/2022.  Impression   [name] is a 63 y.o. male with PMH significant for MS c/b spastic quadriparesis and neurogenic bladder, chronic foley, wheelchair bound at home, classic migraines, right-side Trigeminal Neuralgia, who presents to the ED via EMS due to 3 days of severe trigeminal neuralgia pain that has stopped him from eating and leading to failure to thrive.  Recommendations  - Lidocaine only offered temporary relief. -Consider additional dose of fosphenytoin 1000 mg tonight.  Patient's wife requests that this medication be given at night, as it causes him to be very drowsy and unable to eat. -Continue increased lamotrigine 50mg  at 0800, 100mg  at 1200 and 100mg  at 2000 - Lyrica 75mg  at 0800, 1200 and 2000. - PRN opiods for breakthrough pain  ______________________________________________________________________   Patient seen and examined by NP/APP with MD. MD to update note as needed.   [place] , MSN, AGACNP-BC [facility] See Amion for schedule and pager information 11/13/2022 8:26 AM   NEUROHOSPITALIST ADDENDUM Performed a face to face diagnostic evaluation.   I have reviewed the contents of history and physical exam as documented by PA/ARNP/Resident and agree with above documentation.  I have discussed and formulated the above plan as documented. Edits to the note have been made as needed.  Impression/Key exam findings/Plan: fospheny 500 did not help. Will repeat 1000mg  Iv once tonight and levels tomorrow AM. Continue Lamictal 50 at 8am, 100 at 12 and 100 at 8pm.  Repeat Speech/diet recs to see if he would tolerate  thickening liquids much better than sips of water.  Donnetta Simpers, MD [facility] [phone]   If 7pm to 7am, please call on call as listed on AMION.

## 2022-11-13 NOTE — Progress Notes (Signed)
Nutrition Follow-up  DOCUMENTATION CODES:  Severe malnutrition in context of chronic illness  INTERVENTION:  Continue current diet as tolerated   NUTRITION DIAGNOSIS:  Severe Malnutrition related to chronic illness (trigeminal neuralgia) as evidenced by severe fat depletion, severe muscle depletion, percent weight loss. - remains applicable  GOAL:  Patient will meet greater than or equal to 90% of their needs - not progressing, poor PO intake, refusing supplements  MONITOR:  PO intake, Supplement acceptance, Labs, Weight trends  REASON FOR ASSESSMENT:  Malnutrition Screening Tool, Consult Assessment of nutrition requirement/status  ASSESSMENT:  62 year-old male with medical history of multiple sclerosis complicated by quadriplegia and chronic Foley catheter, trigeminal neuralgia, depression, asthma, dysphagia, migraines. He presented to the ED due to facial pain with inability to eat or drink due to pain. He was admitted 1/8-1/13 for similar symptoms.  Pt resting in bed at the time of assessment, wife at bedside. IR consulted for PEG and reports he was not a candidate due to anatomy. Pt elected not to have surgical placed g-tube placed. Appetite has improved but pt still not tolerating liquids due to pain.   Attending team added nectar thick liquid modifier to diet order to see if thicker consistency was also better tolerated. Discussed with wife and pt and left thickener packets at bedside. Also advised liquids would come pre-thickened from dining services. Also left nepro at bedside to try as they are thicker at room temperature.  Average Meal Intake: 1/28: 25% intake x 1 recorded meals  Nutritionally Relevant Medications: Scheduled Meds:  Ensure Enlive  237 mL Oral TID BM   multivitamin with minerals  1 tablet Oral Daily   potassium chloride  40 mEq Oral BID   Labs Reviewed  NUTRITION - FOCUSED PHYSICAL EXAM: Flowsheet Row Most Recent Value  Orbital Region Severe  depletion  Upper Arm Region Mild depletion  Thoracic and Lumbar Region Mild depletion  Buccal Region Severe depletion  Temple Region Severe depletion  Clavicle Bone Region Mild depletion  Clavicle and Acromion Bone Region Mild depletion  Scapular Bone Region Mild depletion  Dorsal Hand No depletion  Edema (RD Assessment) Moderate  [legs, scrotal]  Hair Reviewed  Eyes Reviewed  Mouth Reviewed  Skin Reviewed  Nails Reviewed    Diet Order:   Diet Order             Diet regular Room service appropriate? Yes; Fluid consistency: Thin  Diet effective now                   EDUCATION NEEDS:  Education needs have been addressed  Skin:  Skin Assessment: Skin Integrity Issues: Skin Integrity Issues:: DTI, Stage I, Unstageable DTI: bilateral ischial tuberosities Stage I: L heel Unstageable: full thickness to vertebral column  Last BM:  1/27  Height:  Ht Readings from Last 1 Encounters:  11/07/22 5\' 9"  (3.382 m)    Weight:  Wt Readings from Last 1 Encounters:  11/13/22 45.2 kg    Ideal Body Weight:  72.7 kg  BMI:  Body mass index is 14.72 kg/m.  Estimated Nutritional Needs:  Kcal:  2000-2200 kcal Protein:  115-130 grams Fluid:  >/= 2 L/day    Ranell Patrick, RD, LDN Clinical Dietitian RD pager # available in [place]  After hours/weekend pager # available in [facility]

## 2022-11-14 DIAGNOSIS — E46 Unspecified protein-calorie malnutrition: Secondary | ICD-10-CM | POA: Diagnosis not present

## 2022-11-14 DIAGNOSIS — G500 Trigeminal neuralgia: Secondary | ICD-10-CM | POA: Diagnosis not present

## 2022-11-14 LAB — PHENYTOIN LEVEL, TOTAL: Phenytoin Lvl: 19.9 ug/mL (ref 10.0–20.0)

## 2022-11-14 NOTE — Plan of Care (Signed)
  Problem: Education: Goal: Knowledge of General Education information will improve Description: Including pain rating scale, medication(s)/side effects and non-pharmacologic comfort measures Outcome: Progressing   Problem: Activity: Goal: Risk for activity intolerance will decrease Outcome: Progressing   Problem: Nutrition: Goal: Adequate nutrition will be maintained Outcome: Progressing   

## 2022-11-14 NOTE — Progress Notes (Signed)
NEUROLOGY CONSULTATION PROGRESS NOTE   Date of service: November 14, 2022 Patient Name: [name] MRN:  [id number] DOB:  06-Oct-1961  Brief HPI  [name] is a 62 y.o. male with PMH significant for MS c/b spastic quadriparesis and neurogenic bladder, chronic foley, wheelchair bound at home, classic migraines, right-side Trigeminal Neuralgia, who presents to the ED via EMS due to 3 days of severe trigeminal neuralgia pain that has stopped him from eating.   Interval Hx   Patient rates trigeminal neuralgia pain 2 out of 10 this morning and appears more animated, able to move his face more without pain.  He has been able to drink fluids without difficulty this morning.  Phenytoin level 19.9.  Vitals   Vitals:   11/13/22 1800 11/13/22 2018 11/13/22 2358 11/14/22 0416  BP: 102/60 (!) 86/60 (!) 100/58 100/68  Pulse:  85 84 81  Resp:  15 15 14   Temp:  98 F (36.7 C) 97.7 F (36.5 C) (!) 97.5 F (36.4 C)  TempSrc:  Oral Oral Oral  SpO2:  100% 98% 100%  Weight:      Height:         Body mass index is 14.72 kg/m.  Physical Exam   General: Laying comfortably in bed;nods head to answer questions, speaks softly. HENT: Normal external appearance of ears and nose. Unable to assess oropharynx 2/2 trigeminal neuralgia. Neck: Supple, no pain or tenderness  CV: No JVD. No peripheral edema.  Pulmonary: Symmetric Chest rise. Normal respiratory effort.  Abdomen: Soft to touch, non-tender.  Ext: No cyanosis, edema, or deformity  Skin: No rash. Normal palpation of skin.   Musculoskeletal: Normal digits and nails by inspection. No clubbing.   Neurologic Examination limited exam performed 2/2 concern for movement flaring up his trigeminal neuralgia.  Mental status/Cognition: Alert, nods his face to answer questions, speaks softly Speech/language: nods his face to answer questions, does not speak as it flares up his trigeminal neuralgia. Cranial nerves:   CN II Pupils equal and  reactive to light, makes eye contact on left and right.   CN III,IV,VI Looks left and right. No obvious nystegmus   CN V normal sensation in V1, V2, and V3 segments bilaterally but slightly diminished on the right, this is baseline per patient   CN VII no asymmetry, no nasolabial fold flattening   CN VIII normal hearing to speech   CN IX & X Unable to assess   CN XI Head midline   CN XII Tongue midline   Motor:  Muscle bulk: poor Moves left upper extremity on command, unable to move right upper extremity, states that this is normal for him Unable to wiggle toes this morning.  Chronically wheelchair dependent 2/2 MS.  Sensation:  Light touch Intact in all extremities.   Pin prick    Temperature    Vibration   Proprioception    Coordination/Complex Motor:  Unable to asess for concerns for causing a flare up of his trigeminal neurlagia.  Labs   Basic Metabolic Panel:  Lab Results  Component Value Date   NA 139 11/13/2022   K 3.6 11/13/2022   CO2 29 11/13/2022   GLUCOSE 96 11/13/2022   BUN 10 11/13/2022   CREATININE 0.62 11/13/2022   CALCIUM 8.4 (L) 11/13/2022   GFRNONAA >60 11/13/2022   GFRAA 84 07/29/2020   HbA1c:  Lab Results  Component Value Date   HGBA1C 5.4 08/14/2015   LDL:  Lab Results  Component Value Date  LDLCALC 163 (H) 08/14/2015   Urine Drug Screen: No results found for: "LABOPIA", "COCAINSCRNUR", "LABBENZ", "AMPHETMU", "THCU", "LABBARB"  Alcohol Level No results found for: "[facility]" Lab Results  Component Value Date   PHENYTOIN 19.9 11/14/2022   Lab Results  Component Value Date   PHENYTOIN 19.9 11/14/2022    Imaging and Diagnostic studies   CT Head Wo Contrast 1. Stable atrophy pattern. 2. No acute intracranial abnormality by noncontrast CT. No interval change.  MRI FACE trigeminal w/wo C: 1. Possible interaction of a small basilar artery branch/pontine perforator with the superior aspect of the right trigeminal nerve. No other vascular  structure in close proximity to the trigeminal nerves. 2. No acute intracranial process. 3. Redemonstrated demyelinating lesions, which appear unchanged compared to 07/07/2022.  Impression   [name] is a 62 y.o. male with PMH significant for MS c/b spastic quadriparesis and neurogenic bladder, chronic foley, wheelchair bound at home, classic migraines, right-side Trigeminal Neuralgia, who presents to the ED via EMS due to 3 days of severe trigeminal neuralgia pain that has stopped him from eating and leading to failure to thrive.  He has experienced some improvement with several doses of fosphenytoin and increased Lamictal doses.  Phenytoin level 19.9 this morning, so will not give additional dose today.  Recommendations  - Lidocaine only offered temporary relief. -Continue increased lamotrigine 50mg  at 0800, 100mg  at 1200 and 100mg  at 2000 - Lyrica 75mg  at 0800, 1200 and 2000. - PRN opioids for breakthrough pain -Neurology will sign off and remain available for questions and concerns as needed.  ______________________________________________________________________   Patient seen and examined by NP/APP with MD. MD to update note as needed.   [place] , MSN, AGACNP-BC [facility] See Amion for schedule and pager information 11/14/2022 8:15 AM  NEUROHOSPITALIST ADDENDUM Performed a face to face diagnostic evaluation.   I have reviewed the contents of history and physical exam as documented by PA/ARNP/Resident and agree with above documentation.  I have discussed and formulated the above plan as documented. Edits to the note have been made as needed.  Impression/Key exam findings/Plan: trigeminal pain is 2/10. Can try clysis for hydration at home. We will signoff. Please feel free to contact us with any questions or concerns.  Donnetta Simpers, MD [facility] [phone]   If 7pm to 7am, please call on call as listed on AMION.

## 2022-11-14 NOTE — Progress Notes (Signed)
   HD#7 SUBJECTIVE:  Patient Summary: [name] is a 62 y.o. with a pertinent PMH of MS and quadriplegia, chronic foley, trigeminal neuralgia, and depression, who presented with facial pain and admitted for trigeminal neuralgia exacerbation.   Overnight Events: No acute events overnight.   Interim History: The patient was seen and evaluated at the bedside with his wife present. He has still been able to eat food regularly, and tolerated some fluid intake yesterday, although not much. He notes that the higher dose of fosphenytoin helped his pain much better, compared to the lower dose.   OBJECTIVE:  Vital Signs: Vitals:   11/13/22 1800 11/13/22 2018 11/13/22 2358 11/14/22 0416  BP: 102/60 (!) 86/60 (!) 100/58 100/68  Pulse:  85 84 81  Resp:  15 15 14   Temp:  98 F (36.7 C) 97.7 F (36.5 C) (!) 97.5 F (36.4 C)  TempSrc:  Oral Oral Oral  SpO2:  100% 98% 100%  Weight:      Height:       Supplemental O2: Room Air SpO2: 100 % O2 Flow Rate (L/min): 2 L/min FiO2 (%): (!) 18 %  Filed Weights   11/07/22 1052 11/07/22 1703 11/13/22 0500  Weight: 76.2 kg 79.8 kg 45.2 kg     Intake/Output Summary (Last 24 hours) at 11/14/2022 0973 Last data filed at 11/13/2022 2126 Gross per 24 hour  Intake --  Output 450 ml  Net -450 ml   Net IO Since Admission: -2,607.09 mL [11/14/22 0639]  Physical Exam: General: Comfortable appearing, no acute distress. CV: RRR. No murmurs. Pulmonary: Normal work of breathing.  Skin: Warm and dry.  Neuro: A&Ox3. Attempts to converse, speech is slowed. Psych: Normal mood and affect     ASSESSMENT/PLAN:  Assessment: Principal Problem:   Trigeminal neuralgia pain Active Problems:   Neurogenic bladder   Spastic quadriparesis (HCC)   Hypokalemia   Anemia   Plan: Trigeminal neuralgia  Malnutrition 2/2 above  1 g Fosphenytoin infusion was more effective last night, as compared to 500 mg dose. Patient is able to take in solid foods, and thicker  liquids, like apple sauce. He take in some water, however, only had about one 8 oz cup full. Will reach out to [place] still to consider management of outpatient IV fluid hydration. Could also consider hypodermocylsis (recommended by neurology- it is a form of subcutaneous hydration). Will do more research into this and consider this as an option, again, if the [place] is willing to take this on.  - Appreciate neurology follow up and recommendations - Appreciate RD recommendations for nutrition  - Continue lamictal 250 mg daily - Lyrica 75 mg tid  - Percoet 5-325 mg q4h prn for severe pain  Multiple sclerosis c/b spastic quadriplegia and urinary retention Pressure wounds 2/2 above - Continue foley catheter  - Supportive care for wounds - Baclofen 10 mg daily prn for muscle spasms   Depression - Lexapro 10 mg daily  Best Practice: Diet: Regular diet and thickened liquids IVF: Fluids will restart if po intake not improved VTE: enoxaparin (LOVENOX) injection 40 mg Start: 11/11/22 1700 Code: Full AB: None Family Contact: Wife, at bedside. DISPO: Anticipated discharge to Home pending  ability to maintain hydration status without IV fluids and pain control .  Signature: Buddy Duty, D.O.  Internal Medicine Resident, PGY-2 [facility]  Pager: [phone] 6:39 AM, 11/14/2022   Please contact the on call pager after 5 pm and on weekends at [phone].

## 2022-11-14 NOTE — Plan of Care (Signed)
Pt is friendly and funny when given the chance to talk and interact. Pt refuses to be turned most of the time and has been informed of what turning every 2 hours at least does for skin, activity, and resp. Pt continues with minimal oral intake on this shift

## 2022-11-15 DIAGNOSIS — E46 Unspecified protein-calorie malnutrition: Secondary | ICD-10-CM | POA: Diagnosis not present

## 2022-11-15 DIAGNOSIS — G500 Trigeminal neuralgia: Secondary | ICD-10-CM | POA: Diagnosis not present

## 2022-11-15 LAB — BASIC METABOLIC PANEL
Anion gap: 6 (ref 5–15)
BUN: 15 mg/dL (ref 6–20)
CO2: 29 mmol/L (ref 22–32)
Calcium: 8.3 mg/dL — ABNORMAL LOW (ref 8.9–10.3)
Chloride: 106 mmol/L (ref 98–111)
Creatinine, Ser: 0.83 mg/dL (ref 0.61–1.24)
GFR, Estimated: >60 mL/min (ref >60)
Glucose, Bld: 111 mg/dL — ABNORMAL HIGH (ref 70–99)
Potassium: 3.6 mmol/L (ref 3.5–5.1)
Sodium: 141 mmol/L (ref 135–145)

## 2022-11-15 NOTE — Plan of Care (Signed)
  Problem: Education: Goal: Knowledge of General Education information will improve Description: Including pain rating scale, medication(s)/side effects and non-pharmacologic comfort measures Outcome: Progressing   Problem: Pain Managment: Goal: General experience of comfort will improve Outcome: Progressing   

## 2022-11-15 NOTE — Progress Notes (Signed)
                 Interval history Pain is 1 or 2 this morning.  He is tolerating the thickened liquids well.  Physical exam Blood pressure 100/68, pulse 80, temperature 98.6 F (37 C), resp. rate 18, height 5\' 9"  (1.753 m), weight 82 kg, SpO2 99 %.  Comfortable appearing Breathing is regular and unlabored Skin is warm and dry Alert and oriented, speech is slow, voice is hoarse Smiling occasionally, mood and affect are concordant  Assessment and plan Hospital day [name] is a 62 y.o. with multiple sclerosis admitted for trigeminal neuralgia complicated by malnutrition and dehydration.  Principal Problem:   Trigeminal neuralgia pain Active Problems:   Neurogenic bladder   Spastic quadriparesis (HCC)   Hypokalemia   Anemia  Trigeminal neuralgia Malnutrition Pain is improved today.  He is tolerating the thickened liquids.  I still worry about recurrent neuralgia attacks preventing adequate intake of fluids.  We have discussed alternative therapies like subcutaneous fluid infusions and will follow-[facility] with [facility] provider tomorrow to assess their readiness to manage outpatient therapy like this. - Lamotrigine 250 mg daily in divided doses - Pregabalin 75 mg 3 times daily - Oxycodone-acetaminophen 5-325 mg p.o. every 4 hours as needed for severe pain  Nutrition Status: Nutrition Problem: Severe Malnutrition Etiology: chronic illness (trigeminal neuralgia) Signs/Symptoms: severe fat depletion, severe muscle depletion, percent weight loss Percent weight loss: 22 % (x 6 months) Interventions: Refer to RD note for recommendations  Electrolyte abnormalities Potassium within normal limits today.   Multiple sclerosis Complicated by spastic quadriplegia and urinary retention. - Maintain Foley catheter - Baclofen 10 mg p.o. daily as needed for muscle spasms   Pressure wounds - Supportive care   Depression - Escitalopram 10 mg daily  Diet: Regular with thickened  liquids IVF: None VTE: Enoxaparin Code: Limited - DO NOT INTUBATE Family Update: Wife at bedside  Discharge plan: Pending follow-up with outpatient [facility] provider and plan for outpatient hydration in the event PO intake is intolerable.   [name] MD 11/15/2022, 6:42 AM  Pager: [phone] After 5pm on weekdays and 1pm on weekends: [phone]

## 2022-11-16 ENCOUNTER — Other Ambulatory Visit: Payer: Self-pay

## 2022-11-16 DIAGNOSIS — E46 Unspecified protein-calorie malnutrition: Secondary | ICD-10-CM | POA: Diagnosis not present

## 2022-11-16 DIAGNOSIS — E876 Hypokalemia: Secondary | ICD-10-CM

## 2022-11-16 DIAGNOSIS — G500 Trigeminal neuralgia: Secondary | ICD-10-CM | POA: Diagnosis not present

## 2022-11-16 MED ORDER — HYDROMORPHONE HCL 1 MG/ML IJ SOLN
0.5000 mg | Freq: Once | INTRAMUSCULAR | Status: AC
  Administered 2022-11-16: 0.5 mg via INTRAVENOUS
  Filled 2022-11-16: qty 0.5

## 2022-11-16 NOTE — Plan of Care (Signed)
  Problem: Education: Goal: Knowledge of General Education information will improve Description: Including pain rating scale, medication(s)/side effects and non-pharmacologic comfort measures Outcome: Progressing   Problem: Nutrition: Goal: Adequate nutrition will be maintained Outcome: Progressing   

## 2022-11-16 NOTE — Progress Notes (Signed)
Informed to doctor about his pain.

## 2022-11-16 NOTE — Discharge Instructions (Addendum)
[name]  You were evaluated and treated in the hospital for trigeminal neuralgia complicated by dehydration and malnutrition. You were evaluated by consulting neurologists  and dietitians during your hospitalization. You were treated with several medicines, including lidocaine, fosphenytoin, lamotrigine, and pregabalin. We are discharging you now that you are doing better. To help assist you on your road to recovery, I have written the following recommendations:   I recommend the following changes to your medications:  Increase lamotrigine (Lamictal) to 100 mg 3 times daily. Increase pregabalin (Lyrica) to 150 mg 3 times daily.  Talk to your primary care doctor about preventing dehydration during your next episode of trigeminal neuralgia. This may include fluids administered through an IV line or under the skin at home. You may wish to try beverage thickening agents during episodes of trigeminal neuralgia to make oral hydration more tolerable. Several products are available without a prescription at major retailers.  While you were hospitalized, you were evaluated for feeding tube placement, but the risks of such a procedure did not clearly outweigh the benefits. You may wish to restart this conversation with your doctors in the future.  It was a privilege to be a part of your hospital care team, and I hope you feel better as a result of your stay.  All the best, [name], MD

## 2022-11-16 NOTE — TOC Progression Note (Signed)
Transition of Care [facility]) - Progression Note    Patient Details  Name: [name] MRN: [id number] Date of Birth: 1961/08/30  Transition of Care [facility]) CM/SW Contact  Pollie Friar, RN Phone Number: 11/16/2022, 3:30 PM  Clinical Narrative:    MD inquired about IVF at home. They have decided this can be managed through his outpt infusion clinic if needed.  TOC following for further d/c needs.         Expected Discharge Plan and Services                                               Social Determinants of Health (SDOH) Interventions SDOH Screenings   Food Insecurity: No Food Insecurity (11/07/2022)  Housing: Low Risk  (11/07/2022)  Transportation Needs: No Transportation Needs (11/07/2022)  Utilities: Not At Risk (11/07/2022)  Depression (PHQ2-9): Low Risk  (07/04/2020)  Tobacco Use: Medium Risk (11/11/2022)    Readmission Risk Interventions    08/21/2022    1:23 PM  Readmission Risk Prevention Plan  Post Dischage Appt Complete  Medication Screening Complete  Transportation Screening Complete

## 2022-11-16 NOTE — Discharge Summary (Signed)
Name: [name] MRN: [id number] DOB: 09-15-1961 62 y.o. PCP: Pcp, No  Date of Admission: 11/07/2022  7:03 AM Date of Discharge: 11/20/2022 4:59 PM Attending Physician: Axel Filler, *  Discharge Diagnosis: Principal Problem:   Trigeminal neuralgia pain Active Problems:   Neurogenic bladder   Spastic quadriparesis (HCC)   Anemia   Severe malnutrition ([place])   Discharge Medications: Allergies as of 11/20/2022       Reactions   Gadopiclenol Nausea And Vomiting     Med Rec must be completed prior to using this [place]***       Follow-up Appointments:   Disposition and follow-up: [name] is a 62 y.o. year old hospitalized for trigeminal neuralgia complicated by dehydration and malnutrition.  Trigeminal neuralgia Discharged on the following regimen: - Lamotrigine 300 mg daily in divided doses - Pregabalin 150 mg 3 times daily - Oxycodone-acetaminophen 5-325 mg every 6 hours as needed for severe pain  Dehydration and severe malnutrition Unable to tolerate oral intake during neuralgia attacks. Evaluated for PEG tube placement but risks of procedure did not outweigh benefits due segment of transverse colon overlying the stomach. - Recommend beverage thickeners during neuralgia attacks - Consider surgical gastrostomy tube placement  Hospital Course by problem list: Principal Problem:   Trigeminal neuralgia pain Active Problems:   Neurogenic bladder   Spastic quadriparesis (HCC)   Anemia   Severe malnutrition (HCC)  Resolved Problems:   Hypokalemia   Edema   Hyperbilirubinemia  Consults: - neurology - general surgery - IR - nutrition  Procedures:***  Follow-up items:***  Trigeminal neuralgia Presented with a few days of severe facial pain due to previously diagnosed trigeminal neuralgia. The pain prevented him from eating or drinking. He was found to have hypokalemia and hypoalbuminemia secondary to malnutrition. Neurology  consultation was requested. He was treated with an infusion of lidocaine, previously effective, without relief of pain. He was then treated with infusions of fosphenytoin. His home regimen of lamotrigine was increased and home pregabalin was continued. Over the course of several days his pain abated, then recurred.  He was offered ketamine infusion but declined this therapy in favor of increasing his home medicines.  Lamotrigine and pregabalin were again increased.  He was discharged with instructions to follow-up with his neurologist.  Malnutrition and dehydration Complication of trigeminal neuralgia. He was supported with IV fluid hydration. His ability to eat solid food returned first. It was several days before the patient was able to take oral fluids, and even then he required thickening agents to improve tolerability because of pain. He was evaluated for gastrostomy tube placement but because of a segment of transverse colon overlying the stomach, this procedure was deemed too risky.  We offer this patient surgical consultation for gastrostomy tube placement via laparotomy approach, which he declined.  Discharge Exam: ***   Blood pressure (!) 92/58, pulse 91, temperature 99 F (37.2 C), temperature source Oral, resp. rate 18, height 5' 9"$  (1.753 m), weight 81.6 kg, SpO2 96 %.  ***  Pertinent studies and procedures: ***  Discharge Instructions:   Discharge Instructions      [name]  You were evaluated and treated in the hospital for trigeminal neuralgia complicated by dehydration and malnutrition. You were evaluated by consulting neurologists  and dietitians during your hospitalization. You were treated with several medicines, including lidocaine, fosphenytoin, lamotrigine, and pregabalin. We are discharging you now that you are doing better. To help assist you on your road  to recovery, I have written the following recommendations:   I recommend the following changes to  your medications:  Increase lamotrigine (Lamictal) to 100 mg 3 times daily. Increase pregabalin (Lyrica) to 150 mg 3 times daily.  Talk to your primary care doctor about preventing dehydration during your next episode of trigeminal neuralgia. This may include fluids administered through an IV line or under the skin at home. You may wish to try beverage thickening agents during episodes of trigeminal neuralgia to make oral hydration more tolerable. Several products are available without a prescription at major retailers.  While you were hospitalized, you were evaluated for feeding tube placement, but the risks of such a procedure did not clearly outweigh the benefits. You may wish to restart this conversation with your doctors in the future.  It was a privilege to be a part of your hospital care team, and I hope you feel better as a result of your stay.  All the best, [name], MD     [name] MD 11/20/2022, 4:59 PM

## 2022-11-16 NOTE — Progress Notes (Addendum)
                 Interval history No new concerns. Pain 2/10 this morning. Tolerating thickened liquids without much facial pain, although does note some discomfort in his throat. Feels like he can go home in his current state. He and wife are hopeful for a definitive plan for hydration at home or in ambulatory setting in the event neuralgia recurs.  Physical exam Blood pressure 99/68, pulse 75, temperature 97.6 F (36.4 C), temperature source Oral, resp. rate 18, height 5\' 9"  (1.753 m), weight 81.6 kg, SpO2 99 %.  Comfortable appearing Oral mucous membranes are moist, oropharynx without erythema or swelling Breathing is regular and unlabored Alert and oriented Pleasant, mood and affect concordant.  Assessment and plan Hospital day [name] is a 62 y.o. with MS admitted for trigeminal neuralgia complicated by malnutrition and dehydration.  Principal Problem:   Trigeminal neuralgia pain Active Problems:   Neurogenic bladder   Spastic quadriparesis (HCC)   Hypokalemia   Anemia  Trigeminal neuralgia Malnutrition Medically stable for discharge home.  This person's home health agency does not do subcutaneous fluid infusions.  Awaiting word from the [place] primary care provider about their willingness to prescribe [place] fluid infusions in the ambulatory setting to prevent dehydration during recurrent neuralgia attacks. - Lamotrigine 250 mg daily in divided doses - Pregabalin 75 mg 3 times daily - Oxycodone-acetaminophen 5-325 mg p.o. every 4 hours as needed for severe pain   Nutrition Status: Nutrition Problem: Severe Malnutrition Etiology: chronic illness (trigeminal neuralgia) Signs/Symptoms: severe fat depletion, severe muscle depletion, percent weight loss Percent weight loss: 22 % (x 6 months) Interventions: Refer to RD note for recommendations   Multiple sclerosis Complicated by spastic quadriplegia and urinary retention. - Maintain Foley catheter - Baclofen 10 mg  p.o. daily as needed for muscle spasms   Pressure wounds Discussed modalities for reducing risk of pressure wounds. This person has an offloading device at home purchased without a prescription that has been effective thus far. - Supportive care   Depression - Escitalopram 10 mg daily  Diet: Regular with thickened liquids IVF: None VTE: Enoxaparin Code: Limited - DO NOT INTUBATE Family Update: Wife at bedside  Discharge plan: Pending coordination of care   [name] MD 11/16/2022, 6:26 AM  Pager: [phone] After 5pm on weekdays and 1pm on weekends: [phone]

## 2022-11-17 LAB — BASIC METABOLIC PANEL
Anion gap: 9 (ref 5–15)
BUN: 16 mg/dL (ref 6–20)
CO2: 26 mmol/L (ref 22–32)
Calcium: 8.5 mg/dL — ABNORMAL LOW (ref 8.9–10.3)
Chloride: 106 mmol/L (ref 98–111)
Creatinine, Ser: 0.53 mg/dL — ABNORMAL LOW (ref 0.61–1.24)
GFR, Estimated: >60 mL/min (ref >60)
Glucose, Bld: 87 mg/dL (ref 70–99)
Potassium: 4.8 mmol/L (ref 3.5–5.1)
Sodium: 141 mmol/L (ref 135–145)

## 2022-11-17 MED ORDER — LACTATED RINGERS IV SOLN: INTRAVENOUS | Status: AC

## 2022-11-17 MED ORDER — PREGABALIN 75 MG PO CAPS
150.0000 mg | ORAL_CAPSULE | ORAL | Status: DC
  Administered 2022-11-17 – 2022-11-21 (×13): 150 mg via ORAL
  Filled 2022-11-17 (×13): qty 2

## 2022-11-17 NOTE — Consult Note (Signed)
Review of the role of ketamine for this patient: RCT n=50 Ketamine vs. n=50 Lidocaine--each as an infusion. Ketamine infusion 0.4mg /kg over 30-35 mins. Lidocaine infusion 5mg /kg over 30-35 mins. Each infusion(s) performed every 4th day for total of 3 infusions. Ketamine had significantly longer duration of pain relief at 2wks, 1, 2 and 3 months without serious side effects for up to 3 months. Ketamine group had a decrease in the use of carbamazepine by patients.   Case report from [facility]: 62 yo male w/chronic refractory TGN. Pt. received one infusion of ketamine (20 milligram bolus then 0.5mg /kg/hr for four hours. 100% pain relief at day 7 follow-up. Then 50% reduction at 1 and 4 month f/u.   One last option: Lidocaine HCl 4%/Ketamine 2.5% in a buffered Nasal Spray (would require a compounding pharmacy to prepare, I.e. this is not commercially available). This is an off-label use of ketamine nasal spray. Ketamine Nasal spray BY ITSELF/ALONE is commercially available but only has an FDA approval for "depression."   Jorene Guest, PharmD, CPP

## 2022-11-17 NOTE — Progress Notes (Signed)
                 Interval history Severe pain yesterday afternoon, into the evening.  Responded well to IV hydromorphone.  This morning pain is a 2/10.  He has not tried to eat or drink anything yet.  Physical exam Blood pressure 102/64, pulse 84, temperature 97.6 F (36.4 C), temperature source Oral, resp. rate 16, height 5\' 9"  (1.753 m), weight 81.6 kg, SpO2 96 %.  Comfortable appearing Breathing is regular and unlabored Alert and oriented, answering questions appropriately Mood and affect are concordant  Labs, images, and other studies BMP stable  Assessment and plan Hospital day [place] is a 62 y.o. with multiple sclerosis admitted for trigeminal neuralgia complicated by malnutrition and dehydration.  Principal Problem:   Trigeminal neuralgia pain Active Problems:   Neurogenic bladder   Spastic quadriparesis (HCC)   Anemia  Trigeminal neuralgia Malnutrition Another episode of pain overnight.  Seemed to progressively get worse over the course of the day yesterday, with paroxysms triggered by certain food and drink.  Will request neurology consultation for review of this patient's case.  Appreciate pharmacy assistance with review of the literature regarding ketamine infusion for trigeminal neuralgia.  Unfortunately, coordination of outpatient subcutaneous and IV fluid therapy has stalled because of a lack of logistically feasible options. - Lamotrigine 250 mg daily in divided doses - Increase pregabalin to 150 mg 3 times daily - Oxycodone-acetaminophen 5-325 mg p.o. every 4 hours as needed for severe pain  Nutrition Status: Nutrition Problem: Severe Malnutrition Etiology: chronic illness (trigeminal neuralgia) Signs/Symptoms: severe fat depletion, severe muscle depletion, percent weight loss Percent weight loss: 22 % (x 6 months) Interventions: Refer to RD note for recommendations  Multiple sclerosis Complicated by spastic quadriplegia and urinary  retention. - Maintain Foley catheter - Baclofen 10 mg p.o. daily as needed for muscle spasms   Pressure wounds Discussed modalities for reducing risk of pressure wounds. This person has an offloading device at home purchased without a prescription that has been effective thus far. - Supportive care   Depression - Escitalopram 10 mg daily   Diet: Regular with thickened liquids IVF: None VTE: Enoxaparin Code: Limited - DO NOT INTUBATE Family Update: Wife at bedside  [name] MD 11/17/2022, 6:47 AM  Pager: [phone] After 5pm on weekdays and 1pm on weekends: [phone]

## 2022-11-18 DIAGNOSIS — E43 Unspecified severe protein-calorie malnutrition: Secondary | ICD-10-CM | POA: Insufficient documentation

## 2022-11-18 DIAGNOSIS — G500 Trigeminal neuralgia: Secondary | ICD-10-CM | POA: Diagnosis not present

## 2022-11-18 MED ORDER — LAMOTRIGINE 100 MG PO TABS
100.0000 mg | ORAL_TABLET | Freq: Three times a day (TID) | ORAL | Status: DC
  Administered 2022-11-19 – 2022-11-21 (×8): 100 mg via ORAL
  Filled 2022-11-18 (×9): qty 1

## 2022-11-18 NOTE — TOC Initial Note (Signed)
Transition of Care [facility]) - Initial/Assessment Note    Patient Details  Name: [name] MRN: [id number] Date of Birth: 30-Apr-1961  [facility] of [facility]) CM/SW Contact:    Pollie Friar, RN Phone Number: 11/18/2022, 1:33 PM  Clinical Narrative:                 CM met with the patient and his spouse at the bedside. Pt was active with [facility] for [facility] services, including foley care at home. Wife states they had given her supplies for home wound care but [facility] RN was not able to make a visit prior to his return to the hospital. CM will update [facility].  TOC following for further d/c needs.   Expected Discharge Plan: [place]     Patient Goals and CMS Choice            Expected Discharge Plan and Services   Discharge Planning Services: CM Consult Post Acute Care Choice: Home Health                             HH Arranged: RN [facility] Agency: [place]        Prior Living Arrangements/Services     Patient language and need for interpreter reviewed:: Yes Do you feel safe going back to the place where you live?: Yes            Criminal Activity/Legal Involvement Pertinent to Current Situation/Hospitalization: No - Comment as needed  Activities of Daily Living Home Assistive Devices/Equipment: Bathtub lift, Shower chair with back, Wheelchair, Eyeglasses ADL Screening (condition at time of admission) Patient's cognitive ability adequate to safely complete daily activities?: Yes Is the patient deaf or have difficulty hearing?: No Does the patient have difficulty seeing, even when wearing glasses/contacts?: No Does the patient have difficulty concentrating, remembering, or making decisions?: Yes Patient able to express need for assistance with ADLs?: Yes Does the patient have difficulty dressing or bathing?: Yes Independently performs ADLs?: No Communication: Needs assistance Is this a change from baseline?: Change from baseline,  expected to last <3 days Dressing (OT): Dependent Is this a change from baseline?: Pre-admission baseline Grooming: Dependent Is this a change from baseline?: Pre-admission baseline Feeding: Dependent Is this a change from baseline?: Pre-admission baseline Bathing: Dependent Is this a change from baseline?: Pre-admission baseline Toileting: Dependent Is this a change from baseline?: Pre-admission baseline In/Out Bed: Dependent Is this a change from baseline?: Pre-admission baseline Walks in Home: Dependent Is this a change from baseline?: Pre-admission baseline Does the patient have difficulty walking or climbing stairs?: Yes Weakness of Legs: Both Weakness of Arms/Hands: Both  Permission Sought/Granted                  Emotional Assessment Appearance:: Appears stated age         Psych Involvement: No (comment)  Admission diagnosis:  Trigeminal neuralgia [G50.0] Trigeminal neuralgia pain [G50.0] Patient Active Problem List   Diagnosis Date Noted   Anemia 11/09/2022   Trigeminal neuralgia pain 11/07/2022   Protein-calorie malnutrition, severe 10/23/2022   Pneumonia of left lower lobe due to infectious organism 10/22/2022   Trigeminal neuralgia of right side of face 10/19/2022   Multiple sclerosis ([place]) 08/20/2022   Pressure injury of skin 08/20/2022   Acute encephalopathy 08/19/2022   Right trigeminal neuralgia 08/17/2022   Sepsis ([place]) 03/10/2022   Catheter-associated urinary tract infection ([place]) 03/09/2022   Urinary tract  infection 10/13/2020   Lactic acidosis 02/23/2020   Sepsis due to Enterococcus ([place]) 02/23/2020   Enterococcus UTI 02/23/2020   Hyperkalemia 02/23/2020   Penile bleeding 02/23/2020   Acute respiratory failure with hypoxemia ([place]) 02/23/2020   Spastic quadriparesis ([place]) 12/27/2019   Restrictive lung disease 09/06/2017   Depression 09/06/2017   MS (multiple sclerosis) ([place]) 08/13/2017   Dyspnea 08/13/2017   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 [address] [place] [address] Phone: [phone] Fax: [phone]  [place], [place] [address] [place] [address] Phone: [phone] Fax: [phone]  [place], [place] DEPT [place] [address] [place] [address] Phone: [phone] Fax: [phone]  [place] [address] Phone: [phone] Fax: [place], [address] [place] [address] Phone: [phone] Fax: [phone]  [facility] [address] [place] [address] Phone: [phone] Fax: [phone]     Social Determinants of Health (SDOH) Social History: [place]: No Food Insecurity (11/07/2022)  Housing: Low Risk  (11/07/2022)  Transportation Needs: No Transportation Needs (11/07/2022)  Utilities: Not At Risk (11/07/2022)  Depression (PHQ2-9): Low Risk  (07/04/2020)  Tobacco Use: Medium Risk (11/11/2022)   SDOH Interventions:     Readmission Risk Interventions    08/21/2022    1:23 PM  Readmission Risk Prevention Plan  Post Dischage Appt Complete  Medication Screening Complete  Transportation Screening Complete

## 2022-11-18 NOTE — Progress Notes (Signed)
                 Interval history Pain was up to a 5/10 this morning, but has still been able to eat and drink some. Pancakes seemed to be the most bothersome food today. We re-evaluated the patient in the afternoon, and he notes that his pain is actually improved and thinks the higher dose of lyrica is helping. He is anxious about starting ketamine and would like to defer this until he absolutely needs to.    Physical exam Blood pressure 97/66, pulse 74, temperature (!) 97.5 F (36.4 C), temperature source Oral, resp. rate 17, height 5\' 9"  (1.753 m), weight 81.6 kg, SpO2 99 %.  Comfortable appearing, laying in bed Breathing is regular and unlabored Alert and oriented, answers questions appropriately  Mood and affect are concordant  Gluteal pressure wounds were visualized with some skin sloughing and drainage.  Assessment and plan Hospital day [place] is a 62 y.o. with multiple sclerosis admitted for trigeminal neuralgia complicated by malnutrition and dehydration.   Principal Problem:   Trigeminal neuralgia pain Active Problems:   Neurogenic bladder   Spastic quadriparesis (HCC)   Anemia  Trigeminal neuralgia Malnutrition The patient continues to have pain, triggered by eating pancakes this morning and by certain liquids. We re-involved neurology in this case, and the recommendation was to consider ketamine, as an infusion or intermittent bolus. Discussed case with neuro-intensivist and anesthesia, who recommended transfer to 4N ICU for this treatment, and to consider a rate of ketamine at 0.1-0.3mg /kg. Presumably, there should be no need for an advanced airway at this infusion rate, however, the patient would require frequent monitoring. Re-evaluated the patient in the afternoon and he would like to defer this treatment plan at this time. His pain is improved with the higher doses of lyrica and lamictal, thus, would like to continue the course for today.   - Appreciate  neurology recommendations - Consider ketamine 0.1-0.3mg /kg infusion + ICU transfer if pain not controlled - Continue pregabalin 150 mg 3 times daily - Continue lamictal 250 mg daily in divided doses, will increase to 300 mg daily tomorrow  - Continue oxycodone-acetaminophen 5-325 mg every 4 hours as needed for severe pain  Nutrition Status: Nutrition Problem: Severe Malnutrition Etiology: chronic illness (trigeminal neuralgia) Signs/Symptoms: severe fat depletion, severe muscle depletion, percent weight loss Percent weight loss: 22 % (x 6 months) Interventions: Refer to RD note for recommendations; also plan to re-address gastrostomy tube consideration tomorrow   Multiple sclerosis Complicated by spastic quadriplegia and urinary retention. - Maintain Foley catheter; will need changed tomorrow - Baclofen 10 mg p.o. daily as needed for muscle spasms   Pressure wounds Discussed modalities for reducing risk of pressure wounds. This person has an offloading device at home purchased without a prescription that has been effective thus far. Wounds were evaluated today, with some gluteal skin sloughing appreciated. - Supportive care   Depression - Escitalopram 10 mg daily  Diet: Regular with thickened liquids IVF: None VTE: Enoxaparin Code: Limited - DO NOT INTUBATE Family Update: Wife, updated at bedside  [name] MD 11/18/2022, 6:55 AM  Pager: [phone] After 5pm on weekdays and 1pm on weekends: [phone]

## 2022-11-18 NOTE — Progress Notes (Addendum)
Subjective: Continues to have facial pain, though some response to lyrica.   Exam: Vitals:   11/18/22 0428 11/18/22 0834  BP: 97/66 104/73  Pulse: 74 72  Resp: 17 18  Temp: (!) 97.5 F (36.4 C) 98.2 F (36.8 C)  SpO2: 99% 98%   Gen: In bed, NAD Resp: non-labored breathing, no acute distress Abd: soft, nt  Neuro: MS: awake, alert, gives year as 2023, month as February.  EX:HBZJI, EOMI Motor: he is able to squeeze his right hand slightly, moves left to command, no movement BLE Sensory:reduced on the right compared to left   Pertinent Labs: Cr 0.53  Impression: 62 yo M with refractory trigeminal neuralgia. He has responded to both lidocaine and phenytoin but has only received very short term benefit from either therapy. He is on a sodium channel blocker with lamotrigine as well as lyrica. He does seem to be getting some improvement with lyrica. Given his debilitated state and need for nutrition, I do think that further rescue therapy could be considered. There is evidence for ketamine both as continuous infusion over several days[1] or as intermittent infusions spaced over several days[2].   I have experience with ketamine in the treatment of status epilepticus, but not with un-intubated patients receiving infusions. I would favor discussing with someone credentialed in conscious sedation with experience with ketamine in this type of situation.   Recommendations: 1) Consider ketamine, either as continuous infusion or intermittent bolus 2) continue increased dose of lyrica 150mg  TID(increased from 75mg  TID 2/6 3) continue lamotrigine 50-100-100 4) Will follow.   Roland Rack, MD [facility] [phone]  If 7pm- 7am, please page neurology on call as listed in [place].   [1] [email or web address]  [2]  Mogahed, Lauraine Rinne et al. "Comparative Study between Intravenous Ketamine and Lidocaine Infusion inControlling of  Refractory Trigeminal Neuralgia." Journal of Anesthesia and Clinical Research 8 (2017): 1-8.

## 2022-11-19 DIAGNOSIS — E46 Unspecified protein-calorie malnutrition: Secondary | ICD-10-CM | POA: Diagnosis not present

## 2022-11-19 DIAGNOSIS — G500 Trigeminal neuralgia: Secondary | ICD-10-CM | POA: Diagnosis not present

## 2022-11-19 LAB — MAGNESIUM: Magnesium: 1.8 mg/dL (ref 1.7–2.4)

## 2022-11-19 LAB — BASIC METABOLIC PANEL
Anion gap: 6 (ref 5–15)
BUN: 12 mg/dL (ref 6–20)
CO2: 31 mmol/L (ref 22–32)
Calcium: 8.3 mg/dL — ABNORMAL LOW (ref 8.9–10.3)
Chloride: 100 mmol/L (ref 98–111)
Creatinine, Ser: 0.51 mg/dL — ABNORMAL LOW (ref 0.61–1.24)
GFR, Estimated: >60 mL/min (ref >60)
Glucose, Bld: 86 mg/dL (ref 70–99)
Potassium: 3.2 mmol/L — ABNORMAL LOW (ref 3.5–5.1)
Sodium: 137 mmol/L (ref 135–145)

## 2022-11-19 MED ORDER — ORAL CARE MOUTH RINSE
15.0000 mL | OROMUCOSAL | Status: DC
  Administered 2022-11-19 – 2022-11-21 (×8): 15 mL via OROMUCOSAL

## 2022-11-19 MED ORDER — ORAL CARE MOUTH RINSE: 15.0000 mL | OROMUCOSAL | Status: DC | PRN

## 2022-11-19 MED ORDER — ORAL CARE MOUTH RINSE
15.0000 mL | OROMUCOSAL | Status: DC
  Administered 2022-11-19: 15 mL via OROMUCOSAL

## 2022-11-19 MED ORDER — PHENOL 1.4 % MT LIQD
1.0000 | OROMUCOSAL | Status: DC | PRN
  Filled 2022-11-19: qty 177

## 2022-11-19 MED ORDER — POTASSIUM CHLORIDE CRYS ER 20 MEQ PO TBCR
40.0000 meq | EXTENDED_RELEASE_TABLET | Freq: Two times a day (BID) | ORAL | Status: AC
  Administered 2022-11-19 (×2): 40 meq via ORAL
  Filled 2022-11-19 (×2): qty 2

## 2022-11-19 NOTE — Progress Notes (Signed)
Subjective: Feels pain is significantly improving   Exam: Vitals:   11/19/22 0804 11/19/22 1115  BP: 104/70 100/67  Pulse: 76 87  Resp: 18 18  Temp: 98.2 F (36.8 C) 98.4 F (36.9 C)  SpO2: 96% 100%   Gen: In bed, NAD Resp: non-labored breathing, no acute distress Abd: soft, nt  Neuro: MS: awake, alert, gives year as 2023, month as February.  KX:FGHWE, EOM with bilateral partial INO Motor: he is able to squeeze his right hand slightly, moves left to command, no movement BLE Sensory:reduced on the right compared to left   Impression: 62 yo M with refractory trigeminal neuralgia. He has responded to both lidocaine and phenytoin but has only received very short term benefit from either therapy. He is on a sodium channel blocker with lamotrigine as well as lyrica. He does seem to be getting some improvement which I suspect is from the lyrica.   If further rescue therapy were needed while admitted, I would consider ketamine, but it appears that he may be responding to oral medications now.   Recommendations: 1) Consider ketamine, either as continuous infusion or intermittent bolus if further rescue therapy were needed.  2) continue increased dose of lyrica 150mg  TID(increased from 75mg  TID 2/6) 3) Increase lamotrigine to 100-100-100 4) Would consider lidocaine nasal spray for rescue therapy at discharge 5) If he does end up in the ER for future exacerbations, would favor IV lidocaine as initial acute treatment 6) Neurology will be available on an as needed basis. Please call with further questions or concerns.   Roland Rack, MD [facility] [phone]  If 7pm- 7am, please page neurology on call as listed in [place].  Articles on ketamine for [place]:  [1] [email or web address]  [2]  Mogahed, [employer] et al. "Comparative Study between Intravenous Ketamine and Lidocaine Infusion inControlling of Refractory Trigeminal  Neuralgia." Journal of Anesthesia and Clinical Research 8 (2017): 1-8.

## 2022-11-19 NOTE — Progress Notes (Signed)
                 Interval history Pain 1/10 this morning. Hasn't had anything to eat or drink yet as he is just waking up.  Physical exam Blood pressure 98/61, pulse 84, temperature 97.9 F (36.6 C), temperature source Oral, resp. rate 16, height 5\' 9"  (1.753 m), weight 81.6 kg, SpO2 100 %.  Comfortable appearing Breathing is regular and unlabored Skin is warm and dry Alert, answering questions appropriately  Assessment and plan Hospital day [name] is a 62 y.o. with multiple sclerosis admitted for trigeminal neuralgia complicated by malnutrition and dehydration.  Principal Problem:   Trigeminal neuralgia pain Active Problems:   Neurogenic bladder   Spastic quadriparesis (HCC)   Anemia   Severe malnutrition (HCC)  Trigeminal neuralgia Malnutrition Continues to have pain, but improving somewhat as time goes on, and as dose of lamotrigine and pregabalin are increased.  My major concern is that the frequency of these attacks will increase and they will become more severe.  Ketamine therapy and gastrostomy tube placement may mitigate some of the negative outcome.  Some literature suggests a lasting benefit on the order of months with ketamine infusion.  Will reassess patient's interest in this treatment.  Also, at this point, the benefits of gastrostomy tube placement via laparotomy may outweigh the risks. - Pregabalin 150 mg 3 times daily - Lamotrigine 100 mg 3 times daily - Oxycodone-acetaminophen 5-325 mg every 4 hours as needed for severe pain  Nutrition Status: Nutrition Problem: Severe Malnutrition Etiology: chronic illness (trigeminal neuralgia) Signs/Symptoms: severe fat depletion, severe muscle depletion, percent weight loss Percent weight loss: 22 % (x 6 months) Interventions: Refer to RD note for recommendations  Multiple sclerosis Complicated by spastic quadriplegia and urinary retention. - Maintain Foley catheter; will need changed tomorrow - Baclofen  10 mg p.o. daily as needed for muscle spasms   Pressure wounds Discussed modalities for reducing risk of pressure wounds. This person has an offloading device at home purchased without a prescription that has been effective thus far. Wounds were evaluated today, with some gluteal skin sloughing appreciated. - Supportive care   Depression - Escitalopram 10 mg daily  Diet: Regular with thickened liquids IVF: None VTE: Enoxaparin Code: Limited-DO NOT INTUBATE  [name] MD 11/19/2022, 6:50 AM  Pager: [phone] After 5pm on weekdays and 1pm on weekends: [phone]

## 2022-11-20 ENCOUNTER — Encounter: Payer: Self-pay | Admitting: Neurology

## 2022-11-20 DIAGNOSIS — R000 Tachycardia, unspecified: Secondary | ICD-10-CM

## 2022-11-20 DIAGNOSIS — R509 Fever, unspecified: Secondary | ICD-10-CM | POA: Diagnosis not present

## 2022-11-20 DIAGNOSIS — E46 Unspecified protein-calorie malnutrition: Secondary | ICD-10-CM | POA: Diagnosis not present

## 2022-11-20 DIAGNOSIS — G500 Trigeminal neuralgia: Secondary | ICD-10-CM | POA: Diagnosis not present

## 2022-11-20 LAB — RESPIRATORY PANEL BY PCR

## 2022-11-20 LAB — CBC
HCT: 33.4 % — ABNORMAL LOW (ref 39.0–52.0)
Hemoglobin: 10.9 g/dL — ABNORMAL LOW (ref 13.0–17.0)
MCH: 32.5 pg (ref 26.0–34.0)
MCHC: 32.6 g/dL (ref 30.0–36.0)
MCV: 99.7 fL (ref 80.0–100.0)
Platelets: 224 10*3/uL (ref 150–400)
RBC: 3.35 MIL/uL — ABNORMAL LOW (ref 4.22–5.81)
RDW: 17.9 % — ABNORMAL HIGH (ref 11.5–15.5)
WBC: 10 10*3/uL (ref 4.0–10.5)
nRBC: 0 % (ref 0.0–0.2)

## 2022-11-20 LAB — BASIC METABOLIC PANEL
Anion gap: 13 (ref 5–15)
BUN: 10 mg/dL (ref 6–20)
CO2: 24 mmol/L (ref 22–32)
Calcium: 8.7 mg/dL — ABNORMAL LOW (ref 8.9–10.3)
Chloride: 99 mmol/L (ref 98–111)
Creatinine, Ser: 0.68 mg/dL (ref 0.61–1.24)
GFR, Estimated: >60 mL/min (ref >60)
Glucose, Bld: 111 mg/dL — ABNORMAL HIGH (ref 70–99)
Potassium: 4.2 mmol/L (ref 3.5–5.1)
Sodium: 136 mmol/L (ref 135–145)

## 2022-11-20 LAB — MAGNESIUM: Magnesium: 2 mg/dL (ref 1.7–2.4)

## 2022-11-20 MED ORDER — ACETAMINOPHEN 500 MG PO TABS
500.0000 mg | ORAL_TABLET | Freq: Four times a day (QID) | ORAL | Status: DC | PRN
  Administered 2022-11-20 – 2022-11-21 (×2): 500 mg via ORAL
  Filled 2022-11-20 (×2): qty 1

## 2022-11-20 NOTE — Progress Notes (Signed)
Spoke with Jenny Reichmann RN [facility] about + Covid OC 23 test. Per CDC it is a co-varient of Covid 19 and needs to be on Droplet Isolation. It is more contagious and harmful to the respiratory system than Covid 19.Ival Bible RN (pt's nurse) informed so she can contact the doctor

## 2022-11-20 NOTE — Progress Notes (Signed)
                 Interval history Developed tachycardia and elevated temperature overnight.  Reports a sore throat and chest congestion.  Reports cough but no dyspnea.  Facial pain from neuralgia is much improved, 1 out of 10.  He is eating and drinking well.  Physical exam Blood pressure 104/68, pulse (!) 114, temperature 99.5 F (37.5 C), temperature source Oral, resp. rate 18, height 5\' 9"  (1.753 m), weight 81.6 kg, SpO2 95 %.  Uncomfortable appearing Heart rate is tachycardic and rhythm is regular, right radial pulse 2+ Breathing is regular and unlabored Skin is warm and dry No calf tenderness bilaterally Alert, answering questions appropriately  Labs, images, and other studies Sodium 136 Potassium 4.2 Magnesium 2.0 WBC 10.0 Respiratory pathogen panel pending  Assessment and plan Hospital day [place] is a 62 y.o. with multiple sclerosis admitted for trigeminal neuralgia attack complicated by malnutrition and dehydration.  Principal Problem:   Trigeminal neuralgia pain Active Problems:   Neurogenic bladder   Spastic quadriparesis (HCC)   Anemia   Severe malnutrition (HCC)  Tachycardia Elevated temperature In setting of cough and sore throat, leading suspicion is for respiratory virus.  This patient would be at high risk for a bad outcome from COVID and flu so we will test for these and treat if positive.  No dyspnea.  No hypoxia.  Relative leukocytosis compared to admission but still within normal limits.  Will defer chest imaging for now as index of suspicion for bacterial pneumonia is low.  On prophylactic enoxaparin and no signs of VTE on exam make PE unlikely. - Follow-up respiratory pathogen panel  Trigeminal neuralgia Malnutrition Pain is improved, as is oral intake of food and drink.  This patient declines gastrostomy tube placement.  He fevers increasing oral medications over further infusions for this acute attack. - Pregabalin 150 mg 3 times  daily - Lamotrigine 100 mg 3 times daily - Oxycodone-acetaminophen 5-325 mg every 4 hours as needed for severe pain  Multiple sclerosis Complicated by spastic quadriplegia and urinary retention. - Maintain Foley catheter - Baclofen 10 mg p.o. daily as needed for muscle spasms   Pressure wounds Discussed modalities for reducing risk of pressure wounds. This person has an offloading device at home purchased without a prescription that has been effective thus far. Wounds were evaluated today, with some gluteal skin sloughing appreciated. - Supportive care   Depression - Escitalopram 10 mg daily  Diet: Regular with thickened liquids IVF: None VTE: Enoxaparin Code: Limited, DO NOT INTUBATE  Discharge plan: Anticipate discharge home pending results of respiratory viral testing and treatment  [name] MD 11/20/2022, 7:19 AM  Pager: [phone] After 5pm on weekdays and 1pm on weekends: [phone]

## 2022-11-20 NOTE — Plan of Care (Signed)
Pt has refused to be repositioned all shift. Pt has been Tachycardic since midnight VS when he had a low grade temp. Pt is eating more than he has been. Pt refuses a GT. Pt able to open mouth more tonight than in previous nights.

## 2022-11-21 LAB — CBC
HCT: 33.7 % — ABNORMAL LOW (ref 39.0–52.0)
Hemoglobin: 10.9 g/dL — ABNORMAL LOW (ref 13.0–17.0)
MCH: 32.2 pg (ref 26.0–34.0)
MCHC: 32.3 g/dL (ref 30.0–36.0)
MCV: 99.4 fL (ref 80.0–100.0)
Platelets: 185 10*3/uL (ref 150–400)
RBC: 3.39 MIL/uL — ABNORMAL LOW (ref 4.22–5.81)
RDW: 17.9 % — ABNORMAL HIGH (ref 11.5–15.5)
WBC: 13.4 10*3/uL — ABNORMAL HIGH (ref 4.0–10.5)
nRBC: 0 % (ref 0.0–0.2)

## 2022-11-21 LAB — BASIC METABOLIC PANEL
Anion gap: 11 (ref 5–15)
BUN: 16 mg/dL (ref 6–20)
CO2: 24 mmol/L (ref 22–32)
Calcium: 8.5 mg/dL — ABNORMAL LOW (ref 8.9–10.3)
Chloride: 100 mmol/L (ref 98–111)
Creatinine, Ser: 0.6 mg/dL — ABNORMAL LOW (ref 0.61–1.24)
GFR, Estimated: >60 mL/min (ref >60)
Glucose, Bld: 120 mg/dL — ABNORMAL HIGH (ref 70–99)
Potassium: 3.8 mmol/L (ref 3.5–5.1)
Sodium: 135 mmol/L (ref 135–145)

## 2022-11-21 MED ORDER — PREGABALIN 150 MG PO CAPS: 150.0000 mg | ORAL_CAPSULE | Freq: Three times a day (TID) | ORAL | 0 refills | Status: DC

## 2022-11-21 MED ORDER — LAMOTRIGINE 100 MG PO TABS: 100.0000 mg | ORAL_TABLET | Freq: Three times a day (TID) | ORAL | 0 refills | Status: DC

## 2022-11-21 NOTE — Plan of Care (Signed)
  Problem: Education: Goal: Knowledge of General Education information will improve Description: Including pain rating scale, medication(s)/side effects and non-pharmacologic comfort measures Outcome: Progressing   Problem: Health Behavior/Discharge Planning: Goal: Ability to manage health-related needs will improve Outcome: Not Progressing   Problem: Clinical Measurements: Goal: Will remain free from infection Outcome: Not Progressing Goal: Respiratory complications will improve Outcome: Not Progressing   Problem: Nutrition: Goal: Adequate nutrition will be maintained Outcome: Not Progressing   Problem: Pain Managment: Goal: General experience of comfort will improve Outcome: Not Progressing   Problem: Skin Integrity: Goal: Risk for impaired skin integrity will decrease Outcome: Not Progressing

## 2022-11-23 NOTE — Telephone Encounter (Signed)
I spoke to Mrs [name] about Collen's trigeminal neuralgia.   He can go up on both lamotrigine and Lyrica if he has a flare-up (maximum 620m of each).   I will also see if we can get fosphenytoin for the infusion suite if another flare occurs

## 2022-12-03 ENCOUNTER — Encounter: Payer: Self-pay | Admitting: Neurology

## 2022-12-03 ENCOUNTER — Other Ambulatory Visit: Payer: Self-pay

## 2022-12-03 MED ORDER — ALBUTEROL SULFATE HFA 108 (90 BASE) MCG/ACT IN AERS: 2.0000 | INHALATION_SPRAY | Freq: Four times a day (QID) | RESPIRATORY_TRACT | 3 refills | Status: DC | PRN

## 2022-12-15 ENCOUNTER — Other Ambulatory Visit: Payer: Self-pay | Admitting: Student

## 2022-12-16 ENCOUNTER — Ambulatory Visit (INDEPENDENT_AMBULATORY_CARE_PROVIDER_SITE_OTHER): Payer: No Typology Code available for payment source | Admitting: Neurology

## 2022-12-16 ENCOUNTER — Encounter: Payer: Self-pay | Admitting: Neurology

## 2022-12-16 VITALS — BP 107/71 | HR 64 | Ht 70.0 in | Wt 170.0 lb

## 2022-12-16 DIAGNOSIS — Z79899 Other long term (current) drug therapy: Secondary | ICD-10-CM

## 2022-12-16 DIAGNOSIS — G8250 Quadriplegia, unspecified: Secondary | ICD-10-CM | POA: Diagnosis not present

## 2022-12-16 DIAGNOSIS — F482 Pseudobulbar affect: Secondary | ICD-10-CM

## 2022-12-16 DIAGNOSIS — G500 Trigeminal neuralgia: Secondary | ICD-10-CM

## 2022-12-16 DIAGNOSIS — N319 Neuromuscular dysfunction of bladder, unspecified: Secondary | ICD-10-CM

## 2022-12-16 DIAGNOSIS — G35 Multiple sclerosis: Secondary | ICD-10-CM | POA: Diagnosis not present

## 2022-12-16 MED ORDER — OXYCODONE-ACETAMINOPHEN 5-325 MG PO TABS: 1.0000 | ORAL_TABLET | Freq: Two times a day (BID) | ORAL | 0 refills | Status: DC | PRN

## 2022-12-16 MED ORDER — SANTYL 250 UNIT/GM EX OINT: 1.0000 | TOPICAL_OINTMENT | Freq: Every day | CUTANEOUS | 1 refills | Status: DC

## 2022-12-16 NOTE — Progress Notes (Signed)
[facility]  PATIENT: [name] DOB: 08-Aug-1961  REFERRING DOCTOR OR PCP: Mark Curling, MD SOURCE: Patient, notes from Dr. Jannifer [name], imaging and lab reports, MRI images personally reviewed.  _________________________________   HISTORICAL  CHIEF COMPLAINT:  Chief Complaint  Patient presents with   Follow-up    Pt in room 10, pt in room with wife. Here for MS follow up, pt in electric wheelchair. Pt was in hospital in Jan for Trigeminal neuralgia pain. Pt needs refill on oxycodone     HISTORY OF PRESENT ILLNESS:  [name] is a 62 y.o. man with a relapsing form of secondary progressive MS.  Update 12/16/2022:  Trigeminal neuralgia:   Currently he takes lamotrigine 100 mg po tid and pregabalin 150 mg po tid.     If pain is more severe he can go up to 200 mg po tid and Lyrica 150 mg up to 4 a day.     He sometimes takes an oxycodone at night.    He saw [name] and had a block followed by an aboation for the right TN.    In the hospital fosphenytoin infusion may have given benefit.  When pain is severe he does not eat and lost 10 pounds before last hospitalization.  Due to anatomy, he is not a good candidate for a simple feeding tube and recovery from open surgery might be slow.     He had a recent hospitalization for TN exacerbation.   He has bed sores that are healing.     He saw wound care while in the hospital and wife was instructed in dressing.  A nurse also comes to the house intermittently.   The sores are a little better.   He uses Aquacel and Metahoney.   His wife feels they are improving.        MS:  He has no definite exacerbation but has slow progression.  He is on Ocrevus as his disease modifying therapy and tolerates it well.  He has not had any exacerbations since starting it around 2018 or 2019 .  Next infusion is July 2024.  Lymphocyte count was 0.5 in the hospital.     He is wheelchair bound.    He last used a cane in 2009 and  then [place] crutches..   He has needed a lift to transfer since a long hospital stay for a pseudomonas UTI (urosepsis) May 2021.     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   His spasticity is bothersome but baclofen makes him sleepy.  Therefore he usually just takes 1 a day..  Tizanidine caused hallucinations.  He has numbness waist down.  He notes more issues with word finding.   He has dysphagia and saw speech therapy in the hospital and diet consistency was changed.    He has noted more left arm weakness/spasticity.   Baclofen makes him sleepy so he just takes 1 as needed.   They have some exercise equipment from OT but does not use it    He has a strong FH of MS with two sisters, one niece.   He states his small town in [place] has many people with MS.     MS History: He was diagnosed in 2007 after presenting with gradual worsening of balance.  Around that time, he had hurt his knee but even when knee improved, he flet off balance and would hold the wall or furniture at time.  In retrospect, he had some symptoms in 2001.  At that me an MRI was reportedly normal.  That year, he had fairly sudden onset of numbness below his waist that gradually improved over the next few weeks.     He was in a trial comparing Rebif and Betaseron n 2007 and was initially on Rebif and then changed to Betaseron.   He had injection site reactions.   He started taking GIlenya in 2011.    He switched to [place] in 2019.     IMAGING: MRI of the face 11/09/2022 showed no vascular loop compressing the right trigeminal nerve though a branch of the basilar artery was adjacent to the cisternal segment of the nerve.    MRI of the brain and cervical spine 07/07/2022 was unchanged compared to 2020.  MRI of the head 07/11/2019 shows T2 hyperintense foci within the left posterior medulla, cerebellar hemispheres, lower left central pons, right midbrain, left thalamus, and in the periventricular, juxtacortical  and deep white matter of both hemispheres.  No definite changes compared to the MRI from 08/01/2014.  MRI cervical spine 07/11/2019 shows foci seen on the previous MRI as well as a focus at T2-T3.  MRI of the brain 08/01/2014 shows T2 hyperintense foci within the left posterior medulla, cerebellar hemispheres, lower left central pons, right midbrain, left thalamus, and in the periventricular, juxtacortical and deep white matter of both hemispheres.  No definite changes compared to the MRI from 08/01/2014.  The infratentorial lesions were not clearly present on the previous MRI from 2007 and there has also been some progression in the hemispheres.[name]  MRI of the brain 01/11/2006 shows T2/FLAIR hyperintense foci in the hemispheres consistent with MS  MRI of the cervical spine 01/10/2006 shows T2 hyperintense foci within the spinal cord Adjacent to C5, centrally towards the left adjacent to T1 and at T2-T3  REVIEW OF SYSTEMS: Constitutional: No fevers, chills, sweats, or change in appetite Eyes: No visual changes, double vision, eye pain Ear, nose and throat: No hearing loss, ear pain, nasal congestion, sore throat Cardiovascular: No chest pain, palpitations Respiratory:  No shortness of breath at rest or with exertion.   No wheezes GastrointestinaI: No nausea, vomiting, diarrhea, abdominal pain, fecal incontinence Genitourinary:  No dysuria, urinary retention or frequency.  No nocturia. Musculoskeletal:  No neck pain, back pain Integumentary: No rash, pruritus, skin lesions Neurological: as above Psychiatric: No depression at this time.  No anxiety Endocrine: No palpitations, diaphoresis, change in appetite, change in weigh or increased thirst Hematologic/Lymphatic:  No anemia, purpura, petechiae. Allergic/Immunologic: No itchy/runny eyes, nasal congestion, recent allergic reactions, rashes  ALLERGIES: Allergies  Allergen Reactions   Gadopiclenol Nausea And Vomiting    HOME  MEDICATIONS:  Current Outpatient Medications:    acetaminophen (TYLENOL) 325 MG tablet, Take 2 tablets (650 mg total) by mouth every 6 (six) hours as needed for mild pain, fever or moderate pain (or Fever >/= 101)., Disp: 30 tablet, Rfl: 0   albuterol (VENTOLIN HFA) 108 (90 Base) MCG/ACT inhaler, Inhale 2 puffs into the lungs every 6 (six) hours as needed., Disp: 1 each, Rfl: 3   baclofen (LIORESAL) 10 MG tablet, Take 10 mg by mouth daily as needed for muscle spasms., Disp: , Rfl:    Carboxymethylcellulose Sod PF (THERATEARS) 1 % GEL, Place 1 drop into both eyes daily as needed (dry eyes)., Disp: , Rfl:    collagenase (SANTYL) 250 UNIT/GM ointment, Apply 1 Application topically daily., Disp: 30 g, Rfl: 1  Dextromethorphan-Quinidine 20-10 MG CAPS, Take 1 tablet by mouth 2 (two) times daily. (Patient taking differently: Take 1 tablet by mouth daily.), Disp: 60 capsule, Rfl: 3   escitalopram (LEXAPRO) 10 MG tablet, Take 10 mg by mouth daily., Disp: , Rfl:    ibuprofen (ADVIL) 200 MG tablet, Take 600 mg by mouth every 6 (six) hours as needed for headache or moderate pain., Disp: , Rfl:    lamoTRIgine (LAMICTAL) 100 MG tablet, Take 1 tablet (100 mg total) by mouth 3 (three) times daily., Disp: 90 tablet, Rfl: 0   leptospermum manuka honey (MEDIHONEY) PSTE paste, Apply 1 Application topically daily., Disp: 15 mL, Rfl: 0   modafinil (PROVIGIL) 100 MG tablet, TAKE 1 TABLET (100 MG TOTAL) BY MOUTH IN THE MORNING. *SENT FOR PA* (Patient taking differently: Take 100 mg by mouth daily.), Disp: 90 tablet, Rfl: 1   ocrelizumab 600 mg in sodium chloride 0.9 % 500 mL, Inject 600 mg into the vein every 6 (six) months. , Disp: , Rfl:    pregabalin (LYRICA) 150 MG capsule, Take 1 capsule (150 mg total) by mouth 3 (three) times daily., Disp: 90 capsule, Rfl: 0   oxyCODONE-acetaminophen (PERCOCET) 5-325 MG tablet, Take 1 tablet by mouth every 12 (twelve) hours as needed for severe pain., Disp: 60 tablet, Rfl: 0    potassium chloride SA (KLOR-CON M) 20 MEQ tablet, Take 2 tablets (40 mEq total) by mouth daily. (Patient taking differently: Take 20 mEq by mouth 2 (two) times daily.), Disp: 60 tablet, Rfl: 0  PAST MEDICAL HISTORY: Past Medical History:  Diagnosis Date   Abnormality of gait 11/21/2015   Asthma    childhood asthma   Classic migraine    Depression    Dysphagia    Hay fever    Headache syndrome 12/22/2018   MS (multiple sclerosis) ([place])    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   Trigeminal neuralgia of right side of face     PAST SURGICAL HISTORY: Past Surgical History:  Procedure Laterality Date   eye surgeries     x 2; bilateral 22 and 76    FAMILY HISTORY: Family History  Problem Relation Age of Onset   Cancer Father    Multiple sclerosis Sister    Seizures Maternal Uncle    Parkinsonism Maternal Uncle    Multiple sclerosis Sister    Multiple sclerosis Paternal Uncle    Multiple sclerosis Other    Lung cancer Other        parent   Uterine cancer Other        other    SOCIAL HISTORY:  Social History   Socioeconomic History   Marital status: Married    Spouse name: joy   Number of children: 2   Years of education: college   Highest education level: Not on file  Occupational History   Occupation: disabled  Tobacco Use   Smoking status: Former   Smokeless tobacco: Never  Scientific laboratory technician Use: Not on file  Substance and Sexual Activity   Alcohol use: No    Comment: rare    Drug use: No   Sexual activity: Not on file  Other Topics Concern   Not on file  Social History Narrative   Lives at home, married   Patient is right handed.   Patient drinks 1 cup caffeine daily.   Social Determinants of Health   Financial Resource Strain: Not on file  Food Insecurity: No Food Insecurity (11/07/2022)   Hunger Vital Sign  Worried About Charity fundraiser in the Last Year: Never true    [place] in the Last Year: Never true  Transportation Needs: No  Transportation Needs (11/07/2022)   PRAPARE - Hydrologist (Medical): No    Lack of Transportation (Non-Medical): No  Physical Activity: Not on file  Stress: Not on file  Social Connections: Not on file  Intimate Partner Violence: Not At Risk (11/07/2022)   Humiliation, Afraid, Rape, and Kick questionnaire    Fear of Current or Ex-Partner: No    Emotionally Abused: No    Physically Abused: No    Sexually Abused: No     PHYSICAL EXAM  Vitals:   12/16/22 1100  BP: 107/71  Pulse: 64  Weight: 170 lb (77.1 kg)  Height: '5\' 10"'$  (1.778 m)    Body mass index is 24.39 kg/m.   General: The patient is well-developed and well-nourished and in no acute distress  HEENT:  Head is [place]/AT.  Sclera are anicteric.    Skin: Extremities are without rash or  edema.  Musculoskeletal:  Back is nontender  Neurologic Exam  Mental status: The patient is alert and oriented x 3 at the time of the examination. The patient has apparent normal recent and remote memory, with an apparently normal attention span and concentration ability.   Speech is normal.  Cranial nerves: Extraocular movements show an INO right greater than left and also some nystagmus on upgaze.  Reduced visual acuity OD.  Facial symmetry is present.  Facial strength appears symmetric.  However, he has allodynia with altered sensation in the V2 distribution more than the V3 distribution and to a lesser extent in the V1 distribution.  Trapezius and sternocleidomastoid strength is normal.  Mild dysarthria is noted.  The tongue is midline, and the patient has symmetric elevation of the soft palate. No obvious hearing deficits are noted.  Motor:  Muscle bulk is normal.   Tone is increased legs > arms; right > left . Strength is  3/5 in left arm grip but 2/5 in the shoulder, 1/5 right arm, 0/5 in legs.   Sensory: He is unable to feel touch or vibration in the legs.  Gait and station: Wheelchair bound  Reflexes:  Deep tendon reflexes are symmetric and increased in legs with sustained ankle clonus.   Plantar responses are extensor .    DIAGNOSTIC DATA (LABS, IMAGING, TESTING) - I reviewed patient records, labs, notes, testing and imaging myself where available.  Lab Results  Component Value Date   WBC 13.4 (H) 11/21/2022   HGB 10.9 (L) 11/21/2022   HCT 33.7 (L) 11/21/2022   MCV 99.4 11/21/2022   PLT 185 11/21/2022      Component Value Date/Time   NA 135 11/21/2022 0300   NA 141 01/26/2022 1207   K 3.8 11/21/2022 0300   CL 100 11/21/2022 0300   CO2 24 11/21/2022 0300   GLUCOSE 120 (H) 11/21/2022 0300   BUN 16 11/21/2022 0300   BUN 13 01/26/2022 1207   CREATININE 0.60 (L) 11/21/2022 0300   CALCIUM 8.5 (L) 11/21/2022 0300   PROT 5.3 (L) 11/09/2022 0353   PROT 6.6 01/26/2022 1207   ALBUMIN 2.9 (L) 11/09/2022 0353   ALBUMIN 4.6 01/26/2022 1207   AST 21 11/09/2022 0353   ALT 11 11/09/2022 0353   ALKPHOS 90 11/09/2022 0353   BILITOT 1.7 (H) 11/09/2022 0353   BILITOT 0.5 01/26/2022 1207   GFRNONAA >60 11/21/2022 0300  GFRAA 84 07/29/2020 1429   Lab Results  Component Value Date   CHOL 234 (H) 08/14/2015   HDL 47.80 08/14/2015   LDLCALC 163 (H) 08/14/2015   LDLDIRECT 192.1 08/04/2012   TRIG 116.0 08/14/2015   CHOLHDL 5 08/14/2015   Lab Results  Component Value Date   HGBA1C 5.4 08/14/2015   No results found for: "VITAMINB12" Lab Results  Component Value Date   TSH 0.746 08/19/2022       ASSESSMENT AND PLAN  Multiple sclerosis, secondary progressive (HCC) - Plan: IgG, IgA, IgM, CBC with Differential/Platelet  Right trigeminal neuralgia  Spastic quadriparesis (HCC)  Neurogenic bladder  Pseudobulbar affect  High risk medication use - Plan: IgG, IgA, IgM, CBC with Differential/Platelet   He has trigeminal neuralgia right.  Continue lamotrigine up to 200 mg po tid   continue baclofen and try to go up to 3 times a day if tolerated (had stopped).   Continue Lyrica  up to 600 mg/day He will have a procedure with anesthesiology pain  in 1-2 weeks.   I do not believe he is having an exacerbation as the strength improved after he received IV fluids.   COntinue with wound care (wife sends frequent photos to wound care nurse who advises and sees him intermittently) Ocrevus 600 mg q 6 months They will return to see me in 4 months or sooner if there are new or worsening neurologic symptoms.  42-minute office visit with the majority of the time spent face-to-face for history and physical, discussion/counseling and decision-making.  Additional time with record review (recent hospitalization and imaging) and documentation.  Shaheem Pichon A. Felecia Shelling, MD, [facility] [id number], [id number] AM Certified in Neurology, Clinical Neurophysiology, Sleep Medicine and Neuroimaging  [facility] [address], [place], [place] [address] [phone]

## 2022-12-17 LAB — CBC WITH DIFFERENTIAL/PLATELET
Basophils Absolute: 0 10*3/uL (ref 0.0–0.2)
Basos: 1 %
EOS (ABSOLUTE): 0.2 10*3/uL (ref 0.0–0.4)
Eos: 2 %
Hematocrit: 33.7 % — ABNORMAL LOW (ref 37.5–51.0)
Hemoglobin: 11 g/dL — ABNORMAL LOW (ref 13.0–17.7)
Immature Grans (Abs): 0 10*3/uL (ref 0.0–0.1)
Immature Granulocytes: 0 %
Lymphocytes Absolute: 0.7 10*3/uL (ref 0.7–3.1)
Lymphs: 9 %
MCH: 32.3 pg (ref 26.6–33.0)
MCHC: 32.6 g/dL (ref 31.5–35.7)
MCV: 99 fL — ABNORMAL HIGH (ref 79–97)
Monocytes Absolute: 0.6 10*3/uL (ref 0.1–0.9)
Monocytes: 7 %
Neutrophils Absolute: 6.6 10*3/uL (ref 1.4–7.0)
Neutrophils: 81 %
Platelets: 256 10*3/uL (ref 150–450)
RBC: 3.41 x10E6/uL — ABNORMAL LOW (ref 4.14–5.80)
RDW: 14.6 % (ref 11.6–15.4)
WBC: 8.1 10*3/uL (ref 3.4–10.8)

## 2022-12-17 LAB — IGG, IGA, IGM
IgA/Immunoglobulin A, Serum: 213 mg/dL (ref 90–386)
IgG (Immunoglobin G), Serum: 487 mg/dL — ABNORMAL LOW (ref 603–1613)
IgM (Immunoglobulin M), Srm: 33 mg/dL (ref 20–172)

## 2022-12-17 NOTE — Telephone Encounter (Signed)
"  Approved. The requested medication is covered on the formulary with a Quantity Limit (QL) of 360 per 30 days. Since this request is within the QL, it does not require a Coverage Determination Request. Please call the pharmacy to process the prescription claim. The drug is not paying at the pharmacy because of the opioid safety rules. We do not show that you filled an opioid drug in the last 90 days. Therefore, your first fill of a formulary opioid drug is limited to a 7 day supply. Future fills of formulary opioid drugs will be allowed up to the standard day supply. Please call the pharmacy to change the day supply to up to 7 days."

## 2022-12-17 NOTE — Telephone Encounter (Signed)
Called [facility] at [phone]. Spoke w/ Chase to get prescription info to complete PA below:  RXBIN: [id number] PCN: [id number] GRP: [id number] ID: [id number]  Submitted urgent PA on covermymeds. KeyWX:[id number]. Waiting on determination from [facility].

## 2022-12-21 ENCOUNTER — Other Ambulatory Visit (HOSPITAL_COMMUNITY): Payer: Self-pay

## 2022-12-28 ENCOUNTER — Encounter: Payer: Self-pay | Admitting: Neurology

## 2022-12-28 ENCOUNTER — Telehealth: Payer: Self-pay | Admitting: Neurology

## 2022-12-28 ENCOUNTER — Other Ambulatory Visit: Payer: Self-pay | Admitting: Neurology

## 2022-12-28 ENCOUNTER — Other Ambulatory Visit: Payer: Self-pay

## 2022-12-28 MED ORDER — OXYCODONE-ACETAMINOPHEN 5-325 MG PO TABS: 1.0000 | ORAL_TABLET | Freq: Two times a day (BID) | ORAL | 0 refills | Status: DC | PRN

## 2022-12-28 MED ORDER — SANTYL 250 UNIT/GM EX OINT: 1.0000 | TOPICAL_OINTMENT | Freq: Every day | CUTANEOUS | 3 refills | Status: DC

## 2022-12-28 MED ORDER — PREGABALIN 150 MG PO CAPS: 150.0000 mg | ORAL_CAPSULE | Freq: Three times a day (TID) | ORAL | 0 refills | Status: DC

## 2022-12-28 NOTE — Telephone Encounter (Signed)
Pt's wife is asking that as quickly as possible the my chart message she sent over the weekend be responded to as quickly as possible

## 2022-12-28 NOTE — Telephone Encounter (Signed)
KS,RMA printed rx Lyrica. I shredded an resent electronic request for MD to e-scribe along with oxycodone. Per drug registry, last refilled Lryica 11/21/22 #90 and oxycodone 12/19/22 #14. Last seen 12/16/22 and next f/u 06/24/23.

## 2023-01-04 ENCOUNTER — Other Ambulatory Visit: Payer: Self-pay | Admitting: *Deleted

## 2023-01-04 MED ORDER — MODAFINIL 100 MG PO TABS: 100.0000 mg | ORAL_TABLET | Freq: Every morning | ORAL | 1 refills | Status: DC

## 2023-01-04 NOTE — Telephone Encounter (Signed)
Appears Dr. Jannifer Franklin was the last MD to sent in refill at [facility] back on 11/29/20 #90, 1 refills. He has now retired. Per drug registry, pt last refilled modafinil 100mg  prescribed by Dr. Mable Paris, [place], [place] 11/02/22 #30.

## 2023-01-04 NOTE — Telephone Encounter (Signed)
Rx printed, waiting on MD signature  

## 2023-01-18 ENCOUNTER — Other Ambulatory Visit: Payer: Self-pay | Admitting: *Deleted

## 2023-01-18 MED ORDER — LAMOTRIGINE 100 MG PO TABS: 100.0000 mg | ORAL_TABLET | Freq: Three times a day (TID) | ORAL | 5 refills | Status: DC

## 2023-01-18 MED ORDER — PREGABALIN 150 MG PO CAPS: 150.0000 mg | ORAL_CAPSULE | Freq: Three times a day (TID) | ORAL | 5 refills | Status: DC

## 2023-01-18 NOTE — Telephone Encounter (Signed)
Faxed printed/signed rx's to [place] at [phone]. Received fax confirmation.

## 2023-02-02 ENCOUNTER — Encounter: Payer: Self-pay | Admitting: Neurology

## 2023-02-04 ENCOUNTER — Other Ambulatory Visit: Payer: Self-pay | Admitting: *Deleted

## 2023-02-04 MED ORDER — OXYCODONE-ACETAMINOPHEN 5-325 MG PO TABS: 1.0000 | ORAL_TABLET | Freq: Two times a day (BID) | ORAL | 0 refills | Status: DC | PRN

## 2023-02-04 NOTE — Telephone Encounter (Signed)
Pt requesting refill on oxycodone to got to [place]. Last seen 12/16/22 and next f/u 06/24/23. Per drug registry, last refilled 12/28/22 #60.

## 2023-02-24 ENCOUNTER — Encounter: Payer: Self-pay | Admitting: Neurology

## 2023-02-25 MED ORDER — ESCITALOPRAM OXALATE 10 MG PO TABS: 10.0000 mg | ORAL_TABLET | Freq: Every day | ORAL | 3 refills | Status: DC

## 2023-02-25 NOTE — Telephone Encounter (Signed)
Per Dr.Sater " I'm fine for [place] to take over that script #90 #3

## 2023-03-26 ENCOUNTER — Encounter: Payer: Self-pay | Admitting: Neurology

## 2023-03-29 ENCOUNTER — Other Ambulatory Visit: Payer: Self-pay | Admitting: *Deleted

## 2023-03-29 MED ORDER — OXYCODONE-ACETAMINOPHEN 5-325 MG PO TABS: 1.0000 | ORAL_TABLET | Freq: Two times a day (BID) | ORAL | 0 refills | Status: DC | PRN

## 2023-03-29 NOTE — Telephone Encounter (Signed)
Last seen 12/16/22 and next f/u 06/24/23. Last refilled oxycodone 02/04/23 #60 per drug registry.

## 2023-05-18 ENCOUNTER — Encounter (HOSPITAL_BASED_OUTPATIENT_CLINIC_OR_DEPARTMENT_OTHER): Payer: No Typology Code available for payment source | Attending: Internal Medicine | Admitting: Internal Medicine

## 2023-05-18 DIAGNOSIS — L89103 Pressure ulcer of unspecified part of back, stage 3: Secondary | ICD-10-CM | POA: Diagnosis not present

## 2023-05-18 DIAGNOSIS — L89102 Pressure ulcer of unspecified part of back, stage 2: Secondary | ICD-10-CM | POA: Insufficient documentation

## 2023-05-18 DIAGNOSIS — G35 Multiple sclerosis: Secondary | ICD-10-CM | POA: Diagnosis not present

## 2023-05-18 DIAGNOSIS — E440 Moderate protein-calorie malnutrition: Secondary | ICD-10-CM | POA: Insufficient documentation

## 2023-05-18 DIAGNOSIS — L89312 Pressure ulcer of right buttock, stage 2: Secondary | ICD-10-CM | POA: Insufficient documentation

## 2023-05-18 DIAGNOSIS — L89314 Pressure ulcer of right buttock, stage 4: Secondary | ICD-10-CM | POA: Insufficient documentation

## 2023-05-18 DIAGNOSIS — L89324 Pressure ulcer of left buttock, stage 4: Secondary | ICD-10-CM | POA: Insufficient documentation

## 2023-05-18 DIAGNOSIS — N319 Neuromuscular dysfunction of bladder, unspecified: Secondary | ICD-10-CM | POA: Insufficient documentation

## 2023-05-21 ENCOUNTER — Encounter (HOSPITAL_COMMUNITY): Payer: Self-pay

## 2023-05-21 ENCOUNTER — Other Ambulatory Visit: Payer: Self-pay

## 2023-05-21 ENCOUNTER — Emergency Department (HOSPITAL_COMMUNITY): Payer: No Typology Code available for payment source

## 2023-05-21 ENCOUNTER — Inpatient Hospital Stay (HOSPITAL_COMMUNITY)
Admission: EM | Admit: 2023-05-21 | Discharge: 2023-05-24 | DRG: 698 | Disposition: A | Discharge status: Home Health | Payer: No Typology Code available for payment source | Attending: Student | Admitting: Student

## 2023-05-21 DIAGNOSIS — G35 Multiple sclerosis: Secondary | ICD-10-CM | POA: Diagnosis not present

## 2023-05-21 DIAGNOSIS — R1310 Dysphagia, unspecified: Secondary | ICD-10-CM

## 2023-05-21 DIAGNOSIS — Z79620 Long term (current) use of immunosuppressive biologic: Secondary | ICD-10-CM

## 2023-05-21 DIAGNOSIS — R001 Bradycardia, unspecified: Secondary | ICD-10-CM | POA: Diagnosis not present

## 2023-05-21 DIAGNOSIS — G500 Trigeminal neuralgia: Secondary | ICD-10-CM | POA: Diagnosis present

## 2023-05-21 DIAGNOSIS — G35C1 Active secondary progressive multiple sclerosis: Secondary | ICD-10-CM | POA: Diagnosis present

## 2023-05-21 DIAGNOSIS — D519 Vitamin B12 deficiency anemia, unspecified: Secondary | ICD-10-CM | POA: Diagnosis present

## 2023-05-21 DIAGNOSIS — L8990 Pressure ulcer of unspecified site, unspecified stage: Secondary | ICD-10-CM | POA: Diagnosis not present

## 2023-05-21 DIAGNOSIS — M8580 Other specified disorders of bone density and structure, unspecified site: Secondary | ICD-10-CM | POA: Diagnosis present

## 2023-05-21 DIAGNOSIS — T391X5A Adverse effect of 4-Aminophenol derivatives, initial encounter: Secondary | ICD-10-CM | POA: Diagnosis present

## 2023-05-21 DIAGNOSIS — Z87440 Personal history of urinary (tract) infections: Secondary | ICD-10-CM

## 2023-05-21 DIAGNOSIS — L89894 Pressure ulcer of other site, stage 4: Secondary | ICD-10-CM | POA: Diagnosis present

## 2023-05-21 DIAGNOSIS — G9341 Metabolic encephalopathy: Secondary | ICD-10-CM | POA: Diagnosis present

## 2023-05-21 DIAGNOSIS — G928 Other toxic encephalopathy: Secondary | ICD-10-CM | POA: Diagnosis present

## 2023-05-21 DIAGNOSIS — L89324 Pressure ulcer of left buttock, stage 4: Secondary | ICD-10-CM | POA: Diagnosis present

## 2023-05-21 DIAGNOSIS — E538 Deficiency of other specified B group vitamins: Secondary | ICD-10-CM | POA: Insufficient documentation

## 2023-05-21 DIAGNOSIS — L89314 Pressure ulcer of right buttock, stage 4: Secondary | ICD-10-CM | POA: Diagnosis present

## 2023-05-21 DIAGNOSIS — E876 Hypokalemia: Secondary | ICD-10-CM | POA: Diagnosis not present

## 2023-05-21 DIAGNOSIS — N390 Urinary tract infection, site not specified: Secondary | ICD-10-CM | POA: Diagnosis present

## 2023-05-21 DIAGNOSIS — Z66 Do not resuscitate: Secondary | ICD-10-CM | POA: Diagnosis present

## 2023-05-21 DIAGNOSIS — Z7401 Bed confinement status: Secondary | ICD-10-CM

## 2023-05-21 DIAGNOSIS — T83518A Infection and inflammatory reaction due to other urinary catheter, initial encounter: Secondary | ICD-10-CM | POA: Diagnosis present

## 2023-05-21 DIAGNOSIS — L89104 Pressure ulcer of unspecified part of back, stage 4: Secondary | ICD-10-CM | POA: Diagnosis present

## 2023-05-21 DIAGNOSIS — T83511A Infection and inflammatory reaction due to indwelling urethral catheter, initial encounter: Secondary | ICD-10-CM | POA: Diagnosis not present

## 2023-05-21 DIAGNOSIS — Z87891 Personal history of nicotine dependence: Secondary | ICD-10-CM

## 2023-05-21 DIAGNOSIS — Y846 Urinary catheterization as the cause of abnormal reaction of the patient, or of later complication, without mention of misadventure at the time of the procedure: Secondary | ICD-10-CM | POA: Diagnosis present

## 2023-05-21 DIAGNOSIS — Z79899 Other long term (current) drug therapy: Secondary | ICD-10-CM

## 2023-05-21 DIAGNOSIS — G8250 Quadriplegia, unspecified: Secondary | ICD-10-CM | POA: Diagnosis present

## 2023-05-21 DIAGNOSIS — J45909 Unspecified asthma, uncomplicated: Secondary | ICD-10-CM | POA: Diagnosis present

## 2023-05-21 DIAGNOSIS — D509 Iron deficiency anemia, unspecified: Secondary | ICD-10-CM | POA: Diagnosis present

## 2023-05-21 DIAGNOSIS — N319 Neuromuscular dysfunction of bladder, unspecified: Secondary | ICD-10-CM | POA: Diagnosis present

## 2023-05-21 DIAGNOSIS — Z820 Family history of epilepsy and other diseases of the nervous system: Secondary | ICD-10-CM

## 2023-05-21 DIAGNOSIS — Z888 Allergy status to other drugs, medicaments and biological substances status: Secondary | ICD-10-CM

## 2023-05-21 DIAGNOSIS — Z1629 Resistance to other single specified antibiotic: Secondary | ICD-10-CM | POA: Diagnosis present

## 2023-05-21 DIAGNOSIS — D649 Anemia, unspecified: Secondary | ICD-10-CM | POA: Diagnosis present

## 2023-05-21 DIAGNOSIS — T426X5A Adverse effect of other antiepileptic and sedative-hypnotic drugs, initial encounter: Secondary | ICD-10-CM | POA: Diagnosis present

## 2023-05-21 DIAGNOSIS — B965 Pseudomonas (aeruginosa) (mallei) (pseudomallei) as the cause of diseases classified elsewhere: Secondary | ICD-10-CM | POA: Diagnosis present

## 2023-05-21 DIAGNOSIS — D72829 Elevated white blood cell count, unspecified: Secondary | ICD-10-CM | POA: Diagnosis not present

## 2023-05-21 DIAGNOSIS — R5381 Other malaise: Secondary | ICD-10-CM | POA: Diagnosis not present

## 2023-05-21 DIAGNOSIS — F32A Depression, unspecified: Secondary | ICD-10-CM | POA: Diagnosis present

## 2023-05-21 DIAGNOSIS — Z801 Family history of malignant neoplasm of trachea, bronchus and lung: Secondary | ICD-10-CM

## 2023-05-21 DIAGNOSIS — T428X5A Adverse effect of antiparkinsonism drugs and other central muscle-tone depressants, initial encounter: Secondary | ICD-10-CM | POA: Diagnosis present

## 2023-05-21 LAB — COMPREHENSIVE METABOLIC PANEL
ALT: 9 U/L (ref 0–44)
AST: 10 U/L — ABNORMAL LOW (ref 15–41)
Albumin: 2.9 g/dL — ABNORMAL LOW (ref 3.5–5.0)
Alkaline Phosphatase: 108 U/L (ref 38–126)
Anion gap: 9 (ref 5–15)
BUN: 13 mg/dL (ref 6–20)
CO2: 30 mmol/L (ref 22–32)
Calcium: 8.7 mg/dL — ABNORMAL LOW (ref 8.9–10.3)
Chloride: 100 mmol/L (ref 98–111)
Creatinine, Ser: 0.71 mg/dL (ref 0.61–1.24)
GFR, Estimated: >60 mL/min (ref >60)
Glucose, Bld: 81 mg/dL (ref 70–99)
Potassium: 3.6 mmol/L (ref 3.5–5.1)
Sodium: 139 mmol/L (ref 135–145)
Total Bilirubin: 0.6 mg/dL (ref 0.3–1.2)
Total Protein: 5.6 g/dL — ABNORMAL LOW (ref 6.5–8.1)

## 2023-05-21 LAB — URINALYSIS, ROUTINE W REFLEX MICROSCOPIC
Bilirubin Urine: NEGATIVE
Glucose, UA: NEGATIVE mg/dL
Ketones, ur: NEGATIVE mg/dL
Nitrite: NEGATIVE
Protein, ur: NEGATIVE mg/dL
Specific Gravity, Urine: 1.002 — ABNORMAL LOW (ref 1.005–1.030)
pH: 7 (ref 5.0–8.0)

## 2023-05-21 LAB — CBC WITH DIFFERENTIAL/PLATELET
Abs Immature Granulocytes: 0.06 10*3/uL (ref 0.00–0.07)
Basophils Absolute: 0 10*3/uL (ref 0.0–0.1)
Basophils Relative: 0 %
Eosinophils Absolute: 0.2 10*3/uL (ref 0.0–0.5)
Eosinophils Relative: 2 %
HCT: 31.2 % — ABNORMAL LOW (ref 39.0–52.0)
Hemoglobin: 9.4 g/dL — ABNORMAL LOW (ref 13.0–17.0)
Immature Granulocytes: 1 %
Lymphocytes Relative: 6 %
Lymphs Abs: 0.6 10*3/uL — ABNORMAL LOW (ref 0.7–4.0)
MCH: 26.8 pg (ref 26.0–34.0)
MCHC: 30.1 g/dL (ref 30.0–36.0)
MCV: 88.9 fL (ref 80.0–100.0)
Monocytes Absolute: 0.6 10*3/uL (ref 0.1–1.0)
Monocytes Relative: 5 %
Neutro Abs: 9.4 10*3/uL — ABNORMAL HIGH (ref 1.7–7.7)
Neutrophils Relative %: 86 %
Platelets: 314 10*3/uL (ref 150–400)
RBC: 3.51 MIL/uL — ABNORMAL LOW (ref 4.22–5.81)
RDW: 16.5 % — ABNORMAL HIGH (ref 11.5–15.5)
WBC: 10.9 10*3/uL — ABNORMAL HIGH (ref 4.0–10.5)
nRBC: 0 % (ref 0.0–0.2)

## 2023-05-21 LAB — POC OCCULT BLOOD, ED: Fecal Occult Bld: NEGATIVE

## 2023-05-21 LAB — HIV ANTIBODY (ROUTINE TESTING W REFLEX): HIV Screen 4th Generation wRfx: NONREACTIVE

## 2023-05-21 MED ORDER — PIPERACILLIN-TAZOBACTAM 3.375 G IVPB
3.3750 g | Freq: Once | INTRAVENOUS | Status: AC
  Administered 2023-05-21: 3.375 g via INTRAVENOUS
  Filled 2023-05-21: qty 50

## 2023-05-21 MED ORDER — LAMOTRIGINE 25 MG PO TABS
100.0000 mg | ORAL_TABLET | Freq: Every day | ORAL | Status: AC
  Administered 2023-05-21: 100 mg via ORAL
  Filled 2023-05-21: qty 4

## 2023-05-21 MED ORDER — LAMOTRIGINE 25 MG PO TABS: 100.0000 mg | ORAL_TABLET | ORAL | Status: DC

## 2023-05-21 MED ORDER — VITAMIN C 500 MG PO TABS
500.0000 mg | ORAL_TABLET | Freq: Two times a day (BID) | ORAL | Status: DC
  Administered 2023-05-21 – 2023-05-24 (×6): 500 mg via ORAL
  Filled 2023-05-21 (×6): qty 1

## 2023-05-21 MED ORDER — MODAFINIL 100 MG PO TABS
100.0000 mg | ORAL_TABLET | Freq: Every morning | ORAL | Status: DC
  Administered 2023-05-22 – 2023-05-24 (×3): 100 mg via ORAL
  Filled 2023-05-21 (×4): qty 1

## 2023-05-21 MED ORDER — ACETAMINOPHEN 650 MG RE SUPP: 650.0000 mg | Freq: Four times a day (QID) | RECTAL | Status: DC | PRN

## 2023-05-21 MED ORDER — LAMOTRIGINE 25 MG PO TABS
100.0000 mg | ORAL_TABLET | Freq: Every morning | ORAL | Status: DC
  Administered 2023-05-22: 100 mg via ORAL
  Filled 2023-05-21: qty 4

## 2023-05-21 MED ORDER — BACLOFEN 10 MG PO TABS: 10.0000 mg | ORAL_TABLET | Freq: Every day | ORAL | Status: DC | PRN

## 2023-05-21 MED ORDER — ESCITALOPRAM OXALATE 10 MG PO TABS
10.0000 mg | ORAL_TABLET | Freq: Every day | ORAL | Status: DC
  Administered 2023-05-22 – 2023-05-24 (×3): 10 mg via ORAL
  Filled 2023-05-21 (×3): qty 1

## 2023-05-21 MED ORDER — ACETAMINOPHEN 325 MG PO TABS: 650.0000 mg | ORAL_TABLET | Freq: Four times a day (QID) | ORAL | Status: DC | PRN

## 2023-05-21 MED ORDER — ONDANSETRON HCL 4 MG/2ML IJ SOLN: 4.0000 mg | Freq: Four times a day (QID) | INTRAMUSCULAR | Status: DC | PRN

## 2023-05-21 MED ORDER — ENSURE ENLIVE PO LIQD
237.0000 mL | Freq: Two times a day (BID) | ORAL | Status: DC
  Administered 2023-05-22: 237 mL via ORAL

## 2023-05-21 MED ORDER — ALBUTEROL SULFATE (2.5 MG/3ML) 0.083% IN NEBU: 2.5000 mg | INHALATION_SOLUTION | Freq: Four times a day (QID) | RESPIRATORY_TRACT | Status: DC | PRN

## 2023-05-21 MED ORDER — PREGABALIN 75 MG PO CAPS
150.0000 mg | ORAL_CAPSULE | Freq: Three times a day (TID) | ORAL | Status: DC
  Administered 2023-05-21 – 2023-05-24 (×10): 150 mg via ORAL
  Filled 2023-05-21 (×10): qty 2

## 2023-05-21 MED ORDER — ACETAMINOPHEN 500 MG PO TABS
1000.0000 mg | ORAL_TABLET | Freq: Three times a day (TID) | ORAL | Status: DC
  Administered 2023-05-21 – 2023-05-24 (×8): 1000 mg via ORAL
  Filled 2023-05-21 (×8): qty 2

## 2023-05-21 MED ORDER — LAMOTRIGINE 25 MG PO TABS
200.0000 mg | ORAL_TABLET | Freq: Every day | ORAL | Status: DC
  Administered 2023-05-21 – 2023-05-23 (×3): 200 mg via ORAL
  Filled 2023-05-21 (×3): qty 8

## 2023-05-21 MED ORDER — ACETAMINOPHEN 500 MG PO TABS: 1000.0000 mg | ORAL_TABLET | Freq: Three times a day (TID) | ORAL | Status: DC

## 2023-05-21 MED ORDER — ZINC GLUCONATE 50 MG PO TABS: 50.0000 mg | ORAL_TABLET | Freq: Every day | ORAL | Status: DC

## 2023-05-21 MED ORDER — SODIUM CHLORIDE 0.9% FLUSH
3.0000 mL | Freq: Two times a day (BID) | INTRAVENOUS | Status: DC
  Administered 2023-05-22 – 2023-05-24 (×6): 3 mL via INTRAVENOUS

## 2023-05-21 MED ORDER — CARBOXYMETHYLCELLULOSE SOD PF 1 % OP GEL: 1.0000 | Freq: Every day | OPHTHALMIC | Status: DC | PRN

## 2023-05-21 MED ORDER — PREGABALIN 25 MG PO CAPS
150.0000 mg | ORAL_CAPSULE | Freq: Every day | ORAL | Status: AC
  Administered 2023-05-21: 150 mg via ORAL
  Filled 2023-05-21: qty 2

## 2023-05-21 MED ORDER — PIPERACILLIN-TAZOBACTAM 3.375 G IVPB
3.3750 g | Freq: Three times a day (TID) | INTRAVENOUS | Status: DC
  Administered 2023-05-21 – 2023-05-24 (×8): 3.375 g via INTRAVENOUS
  Filled 2023-05-21 (×8): qty 50

## 2023-05-21 MED ORDER — ACETAMINOPHEN 500 MG PO TABS
1000.0000 mg | ORAL_TABLET | Freq: Once | ORAL | Status: AC
  Administered 2023-05-21: 1000 mg via ORAL
  Filled 2023-05-21: qty 2

## 2023-05-21 MED ORDER — ONDANSETRON HCL 4 MG PO TABS: 4.0000 mg | ORAL_TABLET | Freq: Four times a day (QID) | ORAL | Status: DC | PRN

## 2023-05-21 MED ORDER — POLYVINYL ALCOHOL 1.4 % OP SOLN
1.0000 [drp] | OPHTHALMIC | Status: DC | PRN
  Filled 2023-05-21: qty 15

## 2023-05-21 MED ORDER — FOOD THICKENER (SIMPLYTHICK)
1.0000 | ORAL | Status: DC | PRN
  Filled 2023-05-21: qty 1

## 2023-05-21 MED ORDER — OXYCODONE-ACETAMINOPHEN 5-325 MG PO TABS: 1.0000 | ORAL_TABLET | Freq: Four times a day (QID) | ORAL | Status: DC | PRN

## 2023-05-21 MED ORDER — LACTATED RINGERS IV BOLUS
1000.0000 mL | Freq: Once | INTRAVENOUS | Status: AC
  Administered 2023-05-21: 1000 mL via INTRAVENOUS

## 2023-05-21 MED ORDER — ENOXAPARIN SODIUM 40 MG/0.4ML IJ SOSY
40.0000 mg | PREFILLED_SYRINGE | INTRAMUSCULAR | Status: DC
  Administered 2023-05-21 – 2023-05-23 (×3): 40 mg via SUBCUTANEOUS
  Filled 2023-05-21 (×3): qty 0.4

## 2023-05-21 MED ORDER — SACCHAROMYCES BOULARDII 250 MG PO CAPS
250.0000 mg | ORAL_CAPSULE | Freq: Two times a day (BID) | ORAL | Status: DC
  Administered 2023-05-21 – 2023-05-24 (×6): 250 mg via ORAL
  Filled 2023-05-21 (×6): qty 1

## 2023-05-21 NOTE — ED Notes (Signed)
ED TO INPATIENT HANDOFF REPORT  ED Nurse Name and Phone #: Theophilus Bones [phone]  S Name/Age/Gender [name] 62 y.o. male Room/Bed: 025C/025C  Code Status   Code Status: Prior  Home/SNF/Other Home Patient oriented to: self, place, time, and situation Is this baseline? Yes   Triage Complete: Triage complete  Chief Complaint MS-Decub  Triage Note Pt present to ED from home with c/o UTI, and wounds to buttocks. Pt has hx of MS. Per EMS pt positive for UTI via cultures noted by the [place]. Pt denies pain/discomfort at this time.dressings to buttocks intact at this time.  EMS VS: 98.0 F-tempanic  HR: 61  Resp: 16  BP: 120/86  O2: 100% RA    Allergies Allergies  Allergen Reactions   Gadopiclenol Nausea And Vomiting   Zosyn [Piperacillin-Tazobactam In Dex] Nausea Only    Not allergic per family     Level of Care/Admitting Diagnosis ED Disposition     ED Disposition  Admit   Condition  --   Comment  The patient appears reasonably stabilized for admission considering the current resources, flow, and capabilities available in the ED at this time, and I doubt any other [facility] requiring further screening and/or treatment in the ED prior to admission is  present.          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   Trigeminal neuralgia of right side of face    Past Surgical History:  Procedure Laterality Date   eye surgeries     x 2; bilateral 74 and 76     A IV Location/Drains/Wounds Patient Lines/Drains/Airways Status     Active Line/Drains/Airways     Name Placement date Placement time Site Days   Peripheral IV 05/21/23 22 G 1" Anterior;Distal;Left;Upper Arm 05/21/23  1101  Arm  less than 1   Urethral  Catheter Essence Richardson, NT Double-lumen;Non-latex;Straight-tip 11/19/22  1049  Double-lumen;Non-latex;Straight-tip  183   Pressure Injury 08/20/22 Ischial tuberosity Right Deep Tissue Pressure Injury - Purple or maroon localized area of discolored intact skin or blood-filled blister due to damage of underlying soft tissue from pressure and/or shear. 08/20/22  1412  -- 274   Pressure Injury 10/19/22 Ischial tuberosity Left Deep Tissue Pressure Injury - Purple or maroon localized area of discolored intact skin or blood-filled blister due to damage of underlying soft tissue from pressure and/or shear. 10/19/22  2250  -- 214   Pressure Injury 10/19/22 Vertebral column Medial Unstageable - Full thickness tissue loss in which the base of the injury is covered by slough (yellow, tan, gray, green or brown) and/or eschar (tan, brown or black) in the wound bed. 10/19/22  2250  -- 214   Pressure Injury 11/07/22 Vertebral column Medial;Mid Unstageable - Full thickness tissue loss in which the base of the injury is covered by slough (yellow, tan, gray, green or brown) and/or eschar (tan, brown or black) in the wound bed. 11/07/22  0000  -- 195   Pressure Injury 11/13/22 Vertebral column Lower;Mid Stage 2 -  Partial thickness loss of dermis presenting as a shallow open injury with a red, pink wound bed without slough. 11/13/22  0730  -- 189  Intake/Output Last 24 hours No intake or output data in the 24 hours ending 05/21/23 1439  Labs/Imaging Results for orders placed or performed during the hospital encounter of 05/21/23 (from the past 48 hour(s))  CBC with Differential     Status: Abnormal   Collection Time: 05/21/23  9:24 AM  Result Value Ref Range   WBC 10.9 (H) 4.0 - 10.5 K/uL   RBC 3.51 (L) 4.22 - 5.81 MIL/uL   Hemoglobin 9.4 (L) 13.0 - 17.0 g/dL   HCT 91.4 (L) 78.2 - 95.6 %   MCV 88.9 80.0 - 100.0 fL   MCH 26.8 26.0 - 34.0 pg   MCHC 30.1 30.0 - 36.0 g/dL   RDW 21.3 (H) 08.6 -  15.5 %   Platelets 314 150 - 400 K/uL   nRBC 0.0 0.0 - 0.2 %   Neutrophils Relative % 86 %   Neutro Abs 9.4 (H) 1.7 - 7.7 K/uL   Lymphocytes Relative 6 %   Lymphs Abs 0.6 (L) 0.7 - 4.0 K/uL   Monocytes Relative 5 %   Monocytes Absolute 0.6 0.1 - 1.0 K/uL   Eosinophils Relative 2 %   Eosinophils Absolute 0.2 0.0 - 0.5 K/uL   Basophils Relative 0 %   Basophils Absolute 0.0 0.0 - 0.1 K/uL   Immature Granulocytes 1 %   Abs Immature Granulocytes 0.06 0.00 - 0.07 K/uL    Comment: Performed at [facility], [address] [place], [place] [address]  Comprehensive metabolic panel     Status: Abnormal   Collection Time: 05/21/23  9:24 AM  Result Value Ref Range   Sodium 139 135 - 145 mmol/L   Potassium 3.6 3.5 - 5.1 mmol/L   Chloride 100 98 - 111 mmol/L   CO2 30 22 - 32 mmol/L   Glucose, Bld 81 70 - 99 mg/dL    Comment: Glucose reference range applies only to samples taken after fasting for at least 8 hours.   BUN 13 6 - 20 mg/dL   Creatinine, Ser 9.62 0.61 - 1.24 mg/dL   Calcium 8.7 (L) 8.9 - 10.3 mg/dL   Total Protein 5.6 (L) 6.5 - 8.1 g/dL   Albumin 2.9 (L) 3.5 - 5.0 g/dL   AST 10 (L) 15 - 41 U/L   ALT 9 0 - 44 U/L   Alkaline Phosphatase 108 38 - 126 U/L   Total Bilirubin 0.6 0.3 - 1.2 mg/dL   GFR, Estimated >95 >28 mL/min    Comment: (NOTE) Calculated using the CKD-EPI Creatinine Equation (2021)    Anion gap 9 5 - 15    Comment: Performed at [facility], [address], [place], [place] [address]  Urinalysis, Routine w reflex microscopic -Urine, Unspecified Source     Status: Abnormal   Collection Time: 05/21/23  9:34 AM  Result Value Ref Range   Color, Urine YELLOW YELLOW   APPearance CLOUDY (A) CLEAR   Specific Gravity, Urine 1.002 (L) 1.005 - 1.030   pH 7.0 5.0 - 8.0   Glucose, UA NEGATIVE NEGATIVE mg/dL   Hgb urine dipstick SMALL (A) NEGATIVE   Bilirubin Urine NEGATIVE NEGATIVE   Ketones, ur NEGATIVE NEGATIVE mg/dL   Protein, ur NEGATIVE NEGATIVE mg/dL    Nitrite NEGATIVE NEGATIVE   Leukocytes,Ua LARGE (A) NEGATIVE   RBC / HPF 6-10 0 - 5 RBC/hpf   WBC, UA 21-50 0 - 5 WBC/hpf   Bacteria, UA FEW (A) NONE SEEN   Squamous Epithelial / HPF  0-5 0 - 5 /HPF   WBC Clumps PRESENT    Mucus PRESENT    Amorphous Crystal PRESENT     Comment: Performed at [facility], [address], [place], [place] [address]  POC occult blood, ED     Status: None   Collection Time: 05/21/23  1:34 PM  Result Value Ref Range   Fecal Occult Bld NEGATIVE NEGATIVE   DG Pelvis Portable  Result Date: 05/21/2023 CLINICAL DATA:  Sacral ulcers. EXAM: PORTABLE PELVIS 1 VIEWS COMPARISON:  None Available. FINDINGS: Osteopenia. Overlapping cardiac leads. No fracture or dislocation. Grossly preserved joint spaces. No definite erosive changes identified although the inferior aspect of the right pubic bone clipped off the edge of the film. Overall study is somewhat limited for decubitus ulcer evaluation. If there is further concern additional cross-sectional imaging study is recommended for further sensitivity. IMPRESSION: Osteopenia. Limited x-ray. No obvious erosive changes at this time but recommend additional workup when appropriate for further sensitivity with cross-sectional imaging Electronically Signed   By: Karen Kays M.D.   On: 05/21/2023 11:59    Pending Labs Unresulted Labs (From admission, onward)    None       Vitals/Pain Today's Vitals   05/21/23 1130 05/21/23 1200 05/21/23 1230 05/21/23 1300  BP: 102/64 107/66 101/64 99/64  Pulse: (!) 58 70 63 61  Resp: 14 17 15 14   Temp:      TempSrc:      SpO2: 97% 99% 98% 98%  Weight:      Height:      PainSc:        Isolation Precautions No active isolations  Medications Medications  piperacillin-tazobactam (ZOSYN) IVPB 3.375 g (3.375 g Intravenous New Bag/Given 05/21/23 1436)  pregabalin (LYRICA) capsule 150 mg (150 mg Oral Given 05/21/23 1141)  lamoTRIgine (LAMICTAL) tablet 100 mg (100 mg Oral Given  05/21/23 1141)  acetaminophen (TYLENOL) tablet 1,000 mg (1,000 mg Oral Given 05/21/23 1141)  lactated ringers bolus 1,000 mL (1,000 mLs Intravenous Bolus 05/21/23 1321)    Mobility non-ambulatory     Focused Assessments integumentary   R Recommendations: See Admitting Provider Note  Report given to:   Additional Notes: pt can speak in short sentences.

## 2023-05-21 NOTE — ED Triage Notes (Addendum)
Pt present to ED from home with c/o UTI, and wounds to buttocks. Pt has hx of MS. Per EMS pt positive for UTI via cultures noted by the [place]. Pt denies pain/discomfort at this time.dressings to buttocks intact at this time.  EMS VS: 98.0 F-tempanic  HR: 61  Resp: 16  BP: 120/86  O2: 100% RA

## 2023-05-21 NOTE — ED Provider Notes (Signed)
[place] [facility] DEPARTMENT AT [facility] Provider Note   CSN: [id number] Arrival date & time: 05/21/23  6578     History  Chief Complaint  Patient presents with   Wound Check   Urinary Tract Infection   Patient with word finding difficulty, difficult to obtain history. This is not new and secondary to his multiple sclerosis  [name] is a 62 y.o. male with history of multiple sclerosis, neurogenic bladder, pressure ulcers, presents with concern for more confusion over the last week and a half.  He is known to have a history of UTIs, but denies any dysuria, hematuria, increased frequency.  He denies any fevers or chills.  States he has been having loss of bowel control for the past 3 months.  Also reports he has sacral ulcers.   Wound Check  Urinary Tract Infection Presenting symptoms: no dysuria   Associated symptoms: no fever and no hematuria        Home Medications Prior to Admission medications   Medication Sig Start Date End Date Taking? Authorizing Provider  acetaminophen (TYLENOL) 500 MG tablet Take 1,000 mg by mouth in the morning, at noon, and at bedtime.   Yes [provider]  albuterol (VENTOLIN HFA) 108 (90 Base) MCG/ACT inhaler Inhale 2 puffs into the lungs every 6 (six) hours as needed. 12/03/22  Yes Sater, Pearletha Furl, MD  ascorbic acid (VITAMIN C) 500 MG tablet Take 500 mg by mouth 2 (two) times daily.   Yes [provider]  baclofen (LIORESAL) 10 MG tablet Take 10 mg by mouth daily as needed for muscle spasms. 07/30/22  Yes [provider]  Carboxymethylcellulose Sod PF (THERATEARS) 1 % GEL Place 1 Application into both eyes daily as needed (dry eyes).   Yes [provider]  Carboxymethylcellulose Sodium (THERATEARS OP) Place 2-3 drops into both eyes as needed (dry eye).   Yes [provider]  escitalopram (LEXAPRO) 10 MG tablet Take 1 tablet (10 mg total) by mouth daily. 02/25/23  Yes Sater, Pearletha Furl, MD  ibuprofen (ADVIL) 200 MG tablet Take 600 mg by mouth as needed for headache or moderate pain.   Yes [provider]  lamoTRIgine (LAMICTAL) 100 MG tablet Take 1 tablet (100 mg total) by mouth 3 (three) times daily. Patient taking differently: Take 100-200 mg by mouth See admin instructions. Take 100 mg by mouth in the morning and at lunch. Take 200 mg by mouth at bedtime. 01/18/23  Yes Sater, Pearletha Furl, MD  leptospermum manuka honey (MEDIHONEY) PSTE paste Apply 1 Application topically daily. 10/24/22  Yes Mapp, Tavien, MD  modafinil (PROVIGIL) 100 MG tablet Take 1 tablet (100 mg total) by mouth in the morning. 01/04/23  Yes Sater, Pearletha Furl, MD  ocrelizumab 600 mg in sodium chloride 0.9 % 500 mL Inject 600 mg into the vein every 6 (six) months.    Yes [provider]  oxyCODONE-acetaminophen (PERCOCET) 5-325 MG tablet Take 1 tablet by mouth every 12 (twelve) hours as needed for severe pain. Patient taking differently: Take 1 tablet by mouth as needed for severe pain. 03/29/23 03/28/24 Yes Sater, Pearletha Furl, MD  pregabalin (LYRICA) 150 MG capsule Take 1 capsule (150 mg total) by mouth 3 (three) times daily. 01/18/23  Yes Sater, Pearletha Furl, MD  zinc gluconate 50 MG tablet Take 50 mg by mouth at bedtime.   Yes [provider]  collagenase (SANTYL) 250 UNIT/GM ointment Apply 1 Application topically daily. Patient not taking: Reported  on 05/21/2023 12/28/22   Sater, Pearletha Furl, MD  potassium chloride SA (KLOR-CON M) 20 MEQ tablet Take 2 tablets (40 mEq total) by mouth daily. Patient not taking: Reported on 05/21/2023 10/24/22 11/23/22  Karoline Caldwell, MD      Allergies    Gadopiclenol and Zosyn [piperacillin-tazobactam in dex]    Review of Systems   Review of Systems  Constitutional:  Negative for chills and fever.  Genitourinary:  Negative for dysuria, hematuria and urgency.    Physical Exam Updated Vital Signs BP 100/63   Pulse 64   Temp 98 F (36.7 C) (Oral)   Resp 16    Ht 5\' 10"  (1.778 m)   Wt 77 kg   SpO2 97%   BMI 24.36 kg/m  Physical Exam Vitals and nursing note reviewed. Exam conducted with a chaperone present.  Constitutional:      General: He is not in acute distress.    Appearance: He is well-developed.  HENT:     Head: Normocephalic and atraumatic.     Mouth/Throat:     Mouth: Mucous membranes are dry.  Eyes:     Conjunctiva/sclera: Conjunctivae normal.     Pupils: Pupils are equal, round, and reactive to light.  Cardiovascular:     Rate and Rhythm: Normal rate and regular rhythm.     Heart sounds: No murmur heard. Pulmonary:     Effort: Pulmonary effort is normal. No respiratory distress.     Breath sounds: Normal breath sounds.  Abdominal:     General: Abdomen is flat.     Palpations: Abdomen is soft.     Tenderness: There is no abdominal tenderness.  Genitourinary:    Comments: Patient has Foley and bag with yellow urine, no gross hematuria or cloudiness appreciated  Anus with hemorrhoids, nonthrombosed Musculoskeletal:        General: No swelling.     Cervical back: Neck supple.  Skin:    General: Skin is warm and dry.     Capillary Refill: Capillary refill takes less than 2 seconds.     Comments: Patient with 3 grade 4 ulcers of the buttocks, no areas of purulent drainage, erythema.  Pressure ulcer dressings applied  Neurological:     Mental Status: He is alert.  Psychiatric:        Mood and Affect: Mood normal.     ED Results / Procedures / Treatments   Labs (all labs ordered are listed, but only abnormal results are displayed) Labs Reviewed  CBC WITH DIFFERENTIAL/PLATELET - Abnormal; Notable for the following components:      Result Value   WBC 10.9 (*)    RBC 3.51 (*)    Hemoglobin 9.4 (*)    HCT 31.2 (*)    RDW 16.5 (*)    Neutro Abs 9.4 (*)    Lymphs Abs 0.6 (*)    All other components within normal limits  COMPREHENSIVE METABOLIC PANEL - Abnormal; Notable for the following components:   Calcium 8.7 (*)     Total Protein 5.6 (*)    Albumin 2.9 (*)    AST 10 (*)    All other components within normal limits  URINALYSIS, ROUTINE W REFLEX MICROSCOPIC - Abnormal; Notable for the following components:   APPearance CLOUDY (*)    Specific Gravity, Urine 1.002 (*)    Hgb urine dipstick SMALL (*)    Leukocytes,Ua LARGE (*)    Bacteria, UA FEW (*)    All other components within normal limits  POC OCCULT BLOOD, ED    EKG None  Radiology DG Pelvis Portable  Result Date: 05/21/2023 CLINICAL DATA:  Sacral ulcers. EXAM: PORTABLE PELVIS 1 VIEWS COMPARISON:  None Available. FINDINGS: Osteopenia. Overlapping cardiac leads. No fracture or dislocation. Grossly preserved joint spaces. No definite erosive changes identified although the inferior aspect of the right pubic bone clipped off the edge of the film. Overall study is somewhat limited for decubitus ulcer evaluation. If there is further concern additional cross-sectional imaging study is recommended for further sensitivity. IMPRESSION: Osteopenia. Limited x-ray. No obvious erosive changes at this time but recommend additional workup when appropriate for further sensitivity with cross-sectional imaging Electronically Signed   By: Karen Kays M.D.   On: 05/21/2023 11:59    Procedures Procedures    Medications Ordered in ED Medications  piperacillin-tazobactam (ZOSYN) IVPB 3.375 g (3.375 g Intravenous New Bag/Given 05/21/23 1436)  pregabalin (LYRICA) capsule 150 mg (150 mg Oral Given 05/21/23 1141)  lamoTRIgine (LAMICTAL) tablet 100 mg (100 mg Oral Given 05/21/23 1141)  acetaminophen (TYLENOL) tablet 1,000 mg (1,000 mg Oral Given 05/21/23 1141)  lactated ringers bolus 1,000 mL (1,000 mLs Intravenous Bolus 05/21/23 1321)    ED Course/ Medical Decision Making/ A&P                                 Medical Decision Making Amount and/or Complexity of Data Reviewed Labs: ordered. Radiology: ordered.  Risk OTC drugs. Prescription drug management. Decision  regarding hospitalization.   62 y.o. male with pertinent past medical history of multiple sclerosis, malnutrition, neurogenic bladder, pressure ulcers presents to the ED for concern of increased confusion over the past week and a half    Differential diagnosis includes but is not limited to UTI, electrolyte abnormality, pressure ulcer, cellulitis, malnutrition  ED Course:  Patient overall well-appearing, however does have low rectal temperature at 97.2 upon arrival.  All other vital signs within normal limits.  He presents with concern for increased confusion over the last week and a half.  He denies any fevers, chills, cough, any other sick symptoms.  Denies urinary symptoms, however does have history of UTIs and was treated for UTI one month ago with amikacin. He was told that recent urinalysis that the [place] was positive for UTI.  Was not started on treatment yet for this. Will obtain urinalysis here.  He also has pressure ulcers.  Upon evaluation of these it does not look grossly infected, however will obtain CBC for signs of possible systemic infection.  He also has a history of malnutrition will obtain CMP to look for electrolyte abnormalities. 11:08 AM spoke to patient's wife at bedside who reports that the [place] wanted him to have a pelvis x-ray done to evaluate for the extent of his ulcerations, this order has been placed.  Will also give him his noon doses of acetaminophen, lamotrigine, pregabalin for his trigeminal neuralgia. 12:30 PM it does appear that the patient may have a UTI, he has a slight leukocytosis of 10.9, leukocytes present in the urine.  Awaiting urine culture.  We are trying to obtain culture results from the [place] in the meantime. The pelvic x-ray does not show any signs of bone involvement from the ulcers.  He does have slightly low calcium, protein, albumin on CMP, this is likely due to known malnutrition.  The wife at bedside states he is on a nectar thick liquid, does not get a lot  of liquids in him.  Will order some maintenance IV fluids as mucous membranes do look slightly dry and blood pressure is soft.  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  Consult to hospitalist Dr. Katrinka Blazing placed, they will admit for treatment of UTI.  Impression: Urinary tract infection  Disposition:  Patient admitted to hospitalist service with Dr. Katrinka Blazing  Lab Tests: I Ordered, and personally interpreted labs.  The pertinent results include:   CBC with leukocytosis of 10.9, hemoglobin 9.4 down from baseline of 11 CMP with slightly low calcium and protein which is consistent with baseline Urinalysis with large amount leukocytes, no nitrites  Imaging Studies ordered: I ordered imaging studies including pelvic x-ray I independently visualized the imaging with scope of interpretation limited to determining acute life threatening conditions related to emergency care. Imaging showed osteopenia, no concern for osteomyelitis I agree with the radiologist interpretation   Cardiac Monitoring: / EKG: The patient was maintained on a cardiac monitor.  I personally viewed and interpreted the cardiac monitored which showed an underlying rhythm of: Normal sinus rhythm   Consultations Obtained: I requested consultation with the hospitalist Dr. Katrinka Blazing,  and discussed lab and imaging findings as well as pertinent plan - they recommend: Admission  External records from outside source obtained and [facility] as [facility]   Co morbidities that complicate the patient evaluation  multiple sclerosis, malnutrition, neurogenic bladder, pressure             Final Clinical  Impression(s) / ED Diagnoses Final diagnoses:  Lower urinary tract infectious disease  Pressure ulcers of skin of multiple topographic sites    Rx / DC Orders ED Discharge Orders     None         Arabella Merles, PA-C 05/21/23 1453    Laurence Spates, MD 05/22/23 [phone]

## 2023-05-21 NOTE — ED Notes (Addendum)
Per family, pt takes lyrica at 17 for nightly dose

## 2023-05-21 NOTE — Progress Notes (Signed)
Patient admission to the unit is completed. Chronic Urinary Catheter replaced on unit [address] by this nurse. Wound care performed and wound consult placed.

## 2023-05-21 NOTE — H&P (Signed)
History and Physical    Patient: [name] WNU:[id number] DOB: 1961-09-10 DOA: 05/21/2023 DOS: the patient was seen and examined on 05/21/2023 [facility]: [facility]  Patient coming from: Infectious disease office  Chief Complaint:  Chief Complaint  Patient presents with   Wound Check   Urinary Tract Infection   HPI: [name] is a 62 y.o. male with medical history significant of MS who is bedbound, neurogenic bladder with chronic indwelling Foley catheter, dysphagia, trigeminal neuralgia, pressure ulcers, and depression who presents due to due to urinary tract infection.  History is obtained mostly from the patient's wife was present at bedside.  Apparently patient had been found to have a urinary tract infection with Pseudomonas last month for which he had been treated with a one-time dose of amikacin IV.  His wife noted that he still was lethargic and have been confused asked for repeat culture which was obtained  on 7/30 to make sure that the urinary tract infection was cleared.  He had a follow-up appointment on 8/6 with wound care due to chronic pressure ulcers.  Plan had been for him to have x-rays of his pelvis and they wanted him to put a special dressing but it was never prescribed.  Wife makes note that she was told that he should come to the emergency department to get IV antibiotics by the ID doctor after urine cultures grew out Pseudomonas resistant to the amikacin that he had recently been treated.  In the emergency department patient was noted to be afebrile with mild tachypnea.  Labs significant for WBC 10.9 with left shift and hemoglobin 9.4.  Urinalysis noted small hemoglobin, large leukocytes, few bacteria present, 6-10 RBC/hpf, and 21-50 WBCs.  Patient was noted to be fecal occult blood negative.  Review of Systems: As mentioned in the history of present illness. All other systems reviewed and are negative. 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 He has never used smokeless tobacco. He reports that he does not drink alcohol and does not use drugs.  Allergies  Allergen Reactions   Gadopiclenol Nausea And Vomiting   Zosyn [Piperacillin-Tazobactam In Dex] Nausea Only    Not allergic per family     Family History  Problem Relation Age of Onset   Cancer Father    Multiple sclerosis Sister    Seizures Maternal Uncle    Parkinsonism Maternal Uncle    Multiple sclerosis Sister    Multiple sclerosis Paternal Uncle    Multiple sclerosis Other    Lung cancer Other        parent   Uterine cancer Other        other    Prior to Admission medications   Medication Sig Start Date End Date Taking? Authorizing Provider  acetaminophen (TYLENOL) 500 MG tablet Take 1,000 mg by mouth in the morning, at noon, and at bedtime.   Yes [provider]  albuterol (VENTOLIN HFA) 108 (90 Base) MCG/ACT  inhaler Inhale 2 puffs into the lungs every 6 (six) hours as needed. 12/03/22  Yes Sater, Pearletha Furl, MD  ascorbic acid (VITAMIN C) 500 MG tablet Take 500 mg by mouth 2 (two) times daily.   Yes [provider]  baclofen (LIORESAL) 10 MG tablet Take 10 mg by mouth daily as needed for muscle spasms. 07/30/22  Yes [provider]  Carboxymethylcellulose Sod PF (THERATEARS) 1 % GEL Place 1 Application into both eyes daily as needed (dry eyes).   Yes [provider]  Carboxymethylcellulose Sodium (THERATEARS OP) Place 2-3 drops into both eyes as needed (dry eye).   Yes [provider]  escitalopram (LEXAPRO) 10 MG  tablet Take 1 tablet (10 mg total) by mouth daily. 02/25/23  Yes Sater, Pearletha Furl, MD  ibuprofen (ADVIL) 200 MG tablet Take 600 mg by mouth as needed for headache or moderate pain.   Yes [provider]  lamoTRIgine (LAMICTAL) 100 MG tablet Take 1 tablet (100 mg total) by mouth 3 (three) times daily. Patient taking differently: Take 100-200 mg by mouth See admin instructions. Take 100 mg by mouth in the morning and at lunch. Take 200 mg by mouth at bedtime. 01/18/23  Yes Sater, Pearletha Furl, MD  leptospermum manuka honey (MEDIHONEY) PSTE paste Apply 1 Application topically daily. 10/24/22  Yes Mapp, Tavien, MD  modafinil (PROVIGIL) 100 MG tablet Take 1 tablet (100 mg total) by mouth in the morning. 01/04/23  Yes Sater, Pearletha Furl, MD  ocrelizumab 600 mg in sodium chloride 0.9 % 500 mL Inject 600 mg into the vein every 6 (six) months.    Yes [provider]  oxyCODONE-acetaminophen (PERCOCET) 5-325 MG tablet Take 1 tablet by mouth every 12 (twelve) hours as needed for severe pain. Patient taking differently: Take 1 tablet by mouth as needed for severe pain. 03/29/23 03/28/24 Yes Sater, Pearletha Furl, MD  pregabalin (LYRICA) 150 MG capsule Take 1 capsule (150 mg total) by mouth 3 (three) times daily. 01/18/23  Yes Sater, Pearletha Furl, MD  zinc gluconate 50 MG tablet Take 50 mg by mouth at bedtime.   Yes [provider]  collagenase (SANTYL) 250 UNIT/GM ointment Apply 1 Application topically daily. Patient not taking: Reported on 05/21/2023 12/28/22   Sater, Pearletha Furl, MD  potassium chloride SA (KLOR-CON M) 20 MEQ tablet Take 2 tablets (40 mEq total) by mouth daily. Patient not taking: Reported on 05/21/2023 10/24/22 11/23/22  Karoline Caldwell, MD    Physical Exam: Vitals:   05/21/23 1130 05/21/23 1200 05/21/23 1230 05/21/23 1300  BP: 102/64 107/66 101/64 99/64  Pulse: (!) 58 70 63 61  Resp: 14 17 15 14   Temp:      TempSrc:      SpO2: 97% 99% 98% 98%  Weight:      Height:          Constitutional: Older male who appears ill but in no acute distress Eyes: PERRL, lids and conjunctivae normal ENMT: Mucous membranes are moist.  Normal dentition.  Neck: normal, supple  Respiratory: Respiratory effort without significant wheezes or rhonchi appreciated.  O2 saturation currently maintained on room air. Cardiovascular: Regular rate and rhythm, no murmurs / rubs / gallops. No extremity edema.  Abdomen: no tenderness, no masses palpated. No hepatosplenomegaly. Bowel sounds positive.  Musculoskeletal: no clubbing / cyanosis. No joint deformity upper and lower extremities. Good ROM, no contractures. Normal muscle tone.  Skin: Pictures taken out the right mid back, right buttock, and left buttock respectively  Neurologic: Limited use of upper or lower extremities. Psychiatric: Normal judgment and insight. Alert and oriented x 3. Normal mood.   Data Reviewed:  Reviewed labs, imaging, and pertinent records as noted above in HPI.  Assessment and Plan:  Complicated urinary tract infection Patient has chronic indwelling Foley catheter and had recently been treated for Pseudomonas UTI last month treated with amikacin.  Wife noted for which urine culture which was obtained on 7/30 at that positive for Pseudomonas as well but resistant to amikacin.  Urinalysis here noted to have large leukocytes with few bacteria, 50 WBCs.  Question colonization but due to patient having symptoms of lethargy and confusion was started on Zosyn. -Admit to a medical telemetry bed -Check urine culture -Orders placed to change Foley catheter which was last changed on 7/30 -Continue empiric antibiotics of Zosyn -Consider discussing with ID once cultures result  Leukocytosis Acute.  WBC elevated 10.9 with left shift.  Patient otherwise noted to be afebrile.  Thought secondary to above, but other possible source of infection is his pressure ulcer wounds. -Recheck CBC tomorrow  morning  Normocytic anemia Acute on chronic.  Hemoglobin 9.4 which is lower than prior check of 11 on 3/6.  Guaiac was noted to be negative. -Recheck CBC tomorrow morning  Multiple sclerosis Dysphagia Debility Patient has significant multiple sclerosis that is debilitating with dysphagia needing assistance with feeding and is bedbound.  He is on nectar thick liquids.  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  X-ray of the pelvis was obtained and only noted osteopenia. -Low-air-loss mattress replacement -Wound care consult ordered -May warrant further imaging of wounds  Trigeminal neuralgia Wife makes note that he has a good regimen currently that keeps his pain under control. -Continue Lyrica, Lamictal, and Tylenol  Depression -Continue Lexapro  DVT prophylaxis: Lovenox Advance Care Planning:   Code Status: DNR  Consults: None  Family Communication: Wife updated at bedside  Severity of Illness: The appropriate patient status for this patient is INPATIENT. Inpatient status is judged to be reasonable and necessary in order to provide the required intensity of service to ensure the patient's safety. The patient's presenting symptoms, physical exam findings, and initial radiographic and laboratory data in the context of their chronic comorbidities is felt to place them at high risk for further clinical deterioration. Furthermore, it is not anticipated that the patient will be medically stable for discharge from the hospital within 2 midnights of admission.   * I certify that at the point of admission it is my clinical judgment that the patient will require inpatient hospital care spanning beyond 2 midnights from the point of admission due to high intensity of service, high risk for further  deterioration and high frequency of surveillance required.*  Author: Clydie Braun, MD 05/21/2023 2:30 PM  For on call review [email or web address].

## 2023-05-21 NOTE — ED Provider Notes (Incomplete)
This is a 62 year old male with history of multiple sclerosis, neurogenic bladder with chronic urinary catheter, pressure ulcers presenting for concern for UTI.  His wife who is at bedside states that over last month patient has had intermittent loose stool, occasional confusion, night sweats.  No fevers.  Wife is concerned that he has UTIs he has had similar symptoms with prior UTIs.  About a month ago he received a dose of amikacin for possible UTI.  Symptoms persisted, he was recently seen by his primary doctor who repeated a urine culture was concerning for a UTI.  Per wife, the microbiological report only showed sensitivity to Zosyn so they were told to come here as he would need IV antibiotics.  Here he is seen medically stable, benign abdominal exam.  He is not septic.  Will plan to replace his urinary catheter and obtain urinalysis and lab workup.  At baseline he only has strength in his left upper extremity which is unchanged, low concern for MS flare, CVA.

## 2023-05-22 DIAGNOSIS — R1310 Dysphagia, unspecified: Secondary | ICD-10-CM

## 2023-05-22 DIAGNOSIS — G500 Trigeminal neuralgia: Secondary | ICD-10-CM | POA: Diagnosis not present

## 2023-05-22 DIAGNOSIS — D72829 Elevated white blood cell count, unspecified: Secondary | ICD-10-CM

## 2023-05-22 DIAGNOSIS — D649 Anemia, unspecified: Secondary | ICD-10-CM

## 2023-05-22 DIAGNOSIS — N390 Urinary tract infection, site not specified: Secondary | ICD-10-CM

## 2023-05-22 DIAGNOSIS — R5381 Other malaise: Secondary | ICD-10-CM

## 2023-05-22 DIAGNOSIS — G35 Multiple sclerosis: Secondary | ICD-10-CM

## 2023-05-22 DIAGNOSIS — L8990 Pressure ulcer of unspecified site, unspecified stage: Secondary | ICD-10-CM

## 2023-05-22 MED ORDER — LACTATED RINGERS IV BOLUS: 1000.0000 mL | Freq: Once | INTRAVENOUS | Status: DC

## 2023-05-22 MED ORDER — LAMOTRIGINE 25 MG PO TABS
100.0000 mg | ORAL_TABLET | Freq: Two times a day (BID) | ORAL | Status: DC
  Administered 2023-05-22 – 2023-05-24 (×5): 100 mg via ORAL
  Filled 2023-05-22 (×5): qty 4

## 2023-05-22 MED ORDER — LACTATED RINGERS IV SOLN: INTRAVENOUS | Status: DC

## 2023-05-22 MED ORDER — LACTATED RINGERS IV BOLUS
500.0000 mL | Freq: Once | INTRAVENOUS | Status: AC
  Administered 2023-05-22: 500 mL via INTRAVENOUS

## 2023-05-22 MED ORDER — CHLORHEXIDINE GLUCONATE CLOTH 2 % EX PADS
6.0000 | MEDICATED_PAD | Freq: Every day | CUTANEOUS | Status: DC
  Administered 2023-05-22 – 2023-05-24 (×3): 6 via TOPICAL

## 2023-05-22 MED ORDER — LAMOTRIGINE 25 MG PO TABS: 100.0000 mg | ORAL_TABLET | ORAL | Status: DC

## 2023-05-22 NOTE — Progress Notes (Signed)
Pt BP low 79/54 rechecked 89/52. Has been in the 100's and 90's. Pt denies any dizziness.   MD aware.

## 2023-05-22 NOTE — Plan of Care (Signed)

## 2023-05-22 NOTE — Progress Notes (Addendum)
[facility] night cross cover note:   I was notified by RN that patient's blood pressure is now running a little bit softer relative to earlier in the shift, with most recent blood pressure noted to be 89/52 relative to the systolic values in the 90s to 564P earlier.  Heart rate in the 60s. No new complaints at this time, including no associated lightheadedness, dizziness.  He is here with Pseudomonas urinary tract infection, on Zosyn.  He had received a 1 L LR bolus over 4 hours earlier this afternoon.  Not on any continuous IV fluids at this time.  Per my brief chart review, most recent echocardiogram occurred in May 2021, and was notable for LVEF 55 to 60%, as well as normal diastolic parameters and normal right ventricular systolic function.  I subsequently ordered a 500 cc LR bolus to occur over 2 hours.    Mark Pigg, DO Hospitalist

## 2023-05-22 NOTE — Progress Notes (Signed)
PROGRESS NOTE  [name]:[id number] DOB: 18-Oct-1960   PCP: [facility]  Patient is from: Home.  DOA: 05/21/2023 LOS: 1  Chief complaints Chief Complaint  Patient presents with   Wound Check   Urinary Tract Infection     Brief Narrative / Interim history: 62 year old M with PMH of MS open and bedbound at baseline) neurogenic bladder with chronic Foley, dysphagia, TGN, pressure ulcer and depression directed to ED for Pseudomonas UTI.  Seen at the [place] about a month ago when he was diagnosed with Pseudomonas UTI for which she received one-time dose of amikacin IV.  Continued to be lethargic and had urine culture done on 7/30 that grew Pseudomonas resistant to amikacin for which he was advised to go to ED.  In ED, stable vitals.  WBC 10.9 with left shift.  Hgb 9.4.  UA with large LE, few bacteria and WBCs.  Hemoccult negative.  Urine culture obtained.  Started on IV Zosyn based on reported sensitivity from outside.    Subjective: Seen and examined earlier this morning.  No major events overnight of this morning.  No complaints.  Wife at bedside.  Feels better.  Objective: Vitals:   05/22/23 0050 05/22/23 0053 05/22/23 0411 05/22/23 0851  BP: (!) 79/54 (!) 89/52 (!) 92/52 (!) 97/56  Pulse: 65 63 61 63  Resp: 18   17  Temp: 97.9 F (36.6 C)  97.9 F (36.6 C) 98.3 F (36.8 C)  TempSrc:   Oral   SpO2: 95%  97% 95%  Weight:      Height:        Examination:  GENERAL: No apparent distress.  Nontoxic. HEENT: MMM.  Vision and hearing grossly intact.  NECK: Supple.  No apparent JVD.  RESP:  No IWOB.  Fair aeration bilaterally. CVS:  RRR. Heart sounds normal.  ABD/GI/GU: BS+. Abd soft, NTND.  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 SKIN: no apparent skin lesion or wound NEURO: Awake, alert and oriented x 4.  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 PSYCH: Calm. Normal affect.   Procedures:  None  Microbiology summarized: Urine culture pending  Assessment and plan: Principal  Problem:   Complicated UTI (urinary tract infection) Active Problems:   Leukocytosis   Normocytic anemia   Multiple sclerosis, secondary progressive (HCC)   Dysphagia   Debility   Pressure ulcer   Trigeminal neuralgia of right side of face   Depression  Complicated urinary tract infection: Patient with chronic Foley due to MS/neurogenic bladder.  Presents with confusion and lethargy which are not specific to UTI.  Outpatient urine culture on 7/30 with Pseudomonas resistant to amikacin.  UA on presentation concerning but not convincing.  He is also on significant dose of Lyrica, Percocet and baclofen which could contribute to lethargy and confusion.  Foley catheter exchanged. -Continue IV Zosyn pending culture based on urine culture from outside -Discussed with ID based on urine culture result  Normocytic anemia: Stable. 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 He receives Ocrevus injections last given on 7/25.  Reportedly bedbound. -Continue supportive care -Aspiration precautions with elevation head of bed   Trigeminal neuralgia Wife makes note that he has a good regimen currently that keeps his pain under control. -Continue Lyrica, Lamictal, and Tylenol  Mood disorder -Continue Lexapro and Lamictal  Leukocytosis: Mild and resolved.     Body mass index is 24.36 kg/m.  Pressure skin injury: POA.  No signs of infection. Pressure Injury 05/21/23 Buttocks Posterior;Proximal;Right Unstageable - Full thickness tissue loss in which the base of the injury is covered by slough (yellow, tan, gray, green or brown) and/or eschar (tan, brown or black) in the wound bed. (Active)  05/21/23 1720   Location: Buttocks  Location Orientation: Posterior;Proximal;Right  Staging: Unstageable - Full thickness tissue loss in which the base of the injury is covered by slough (yellow, tan, gray, green or brown) and/or eschar (tan, brown or black) in the wound bed.  Wound Description (Comments):   Present on Admission: Yes     Pressure Injury 05/21/23 Buttocks Left Unstageable - Full thickness tissue loss in which the base of the injury is covered by slough (yellow, tan, gray, green or brown) and/or eschar (tan, brown or black) in the wound bed. (Active)  05/21/23 1720  Location: Buttocks  Location Orientation: Left  Staging: Unstageable - Full thickness tissue loss in which the base of the injury is covered by slough (yellow, tan, gray, green or brown) and/or eschar (tan, brown or black) in the wound bed.  Wound Description (Comments):   Present on Admission: Yes   DVT prophylaxis:  enoxaparin (LOVENOX) injection 40 mg Start: 05/21/23 2200  Code Status: DNR/DNI Family Communication: Patient's wife at bedside Level of care: Telemetry Medical Status is: Inpatient Remains inpatient appropriate because: Complicated UTI   Final disposition: Home Consultants:  None  55 minutes with more than 50% spent in reviewing records, counseling patient/family and coordinating care.   Sch Meds:  Scheduled Meds:  acetaminophen  1,000 mg Oral TID   ascorbic acid  500 mg Oral BID   Chlorhexidine Gluconate Cloth  6 each Topical Daily   enoxaparin (LOVENOX) injection  40 mg Subcutaneous Q24H   escitalopram  10 mg Oral Daily   feeding supplement  237 mL Oral BID BM   lamoTRIgine  100 mg Oral q morning   lamoTRIgine  200 mg Oral QHS   modafinil  100 mg Oral q AM   pregabalin  150 mg Oral TID   saccharomyces boulardii  250 mg Oral BID   sodium chloride flush  3 mL Intravenous Q12H   Continuous Infusions:  piperacillin-tazobactam (ZOSYN)  IV 3.375 g (05/22/23 1222)   PRN Meds:.acetaminophen  **OR** acetaminophen, albuterol, baclofen, food thickener, ondansetron **OR** ondansetron (ZOFRAN) IV, oxyCODONE-acetaminophen, polyvinyl alcohol  Antimicrobials: Anti-infectives (From admission, onward)    Start     Dose/Rate Route Frequency Ordered Stop   05/21/23 2000  piperacillin-tazobactam (ZOSYN) IVPB 3.375 g        3.375 g 12.5 mL/hr over 240 Minutes Intravenous Every 8 hours 05/21/23 1902     05/21/23 1415  piperacillin-tazobactam (ZOSYN) IVPB 3.375 g        3.375 g 12.5 mL/hr over 240 Minutes Intravenous Once 05/21/23 1407 05/21/23 1810        I have personally reviewed the following labs and images: CBC: Recent Labs  Lab 05/21/23 0924 05/22/23 0447  WBC 10.9* 10.0  NEUTROABS 9.4*  --   HGB 9.4* 9.8*  HCT 31.2* 31.9*  MCV 88.9 87.9  PLT 314 263   BMP &GFR Recent Labs  Lab 05/21/23 0924 05/22/23 0447  NA 139 138  K 3.6 3.7  CL 100 102  CO2 30 27  GLUCOSE 81 80  BUN 13 11  CREATININE 0.71 0.62  CALCIUM 8.7* 8.3*   Estimated Creatinine Clearance: 101.4 mL/min (by C-G formula based on SCr of 0.62 mg/dL). Liver & Pancreas: Recent Labs  Lab 05/21/23 0924  AST 10*  ALT 9  ALKPHOS 108  BILITOT 0.6  PROT 5.6*  ALBUMIN 2.9*   No results for input(s): "LIPASE", "AMYLASE" in the last 168 hours. No results for input(s): "AMMONIA" in the last 168 hours. Diabetic: No results for input(s): "HGBA1C" in the last 72 hours. No results for input(s): "GLUCAP" in the last 168 hours. Cardiac Enzymes: No results for input(s): "CKTOTAL", "CKMB", "CKMBINDEX", "TROPONINI" in the last 168 hours. No results for input(s): "PROBNP" in the last 8760 hours. Coagulation Profile: No results for input(s): "INR", "PROTIME" in the last 168 hours. Thyroid Function Tests: No results for input(s): "TSH", "T4TOTAL", "FREET4", "T3FREE", "THYROIDAB" in the last 72 hours. Lipid Profile: No results for input(s): "CHOL", "HDL", "LDLCALC", "TRIG", "CHOLHDL", "LDLDIRECT" in the last 72  hours. Anemia Panel: No results for input(s): "VITAMINB12", "FOLATE", "FERRITIN", "TIBC", "IRON", "RETICCTPCT" in the last 72 hours. Urine analysis:    Component Value Date/Time   COLORURINE YELLOW 05/21/2023 0934   APPEARANCEUR CLOUDY (A) 05/21/2023 0934   APPEARANCEUR Clear 07/29/2020 1429   LABSPEC 1.002 (L) 05/21/2023 0934   PHURINE 7.0 05/21/2023 0934   GLUCOSEU NEGATIVE 05/21/2023 0934   HGBUR SMALL (A) 05/21/2023 0934   BILIRUBINUR NEGATIVE 05/21/2023 0934   BILIRUBINUR Negative 07/29/2020 1429   KETONESUR NEGATIVE 05/21/2023 0934   PROTEINUR NEGATIVE 05/21/2023 0934   UROBILINOGEN 0.2 09/12/2018 1534   NITRITE NEGATIVE 05/21/2023 0934   LEUKOCYTESUR LARGE (A) 05/21/2023 0934   Sepsis Labs: Invalid input(s): "PROCALCITONIN", "LACTICIDVEN"  Microbiology: No results found for this or any previous visit (from the past 240 hour(s)).  Radiology Studies: No results found.     T.  [facility]  If 7PM-7AM, please contact night-coverage [email or web address] 05/22/2023, 1:00 PM

## 2023-05-23 DIAGNOSIS — G9341 Metabolic encephalopathy: Secondary | ICD-10-CM | POA: Insufficient documentation

## 2023-05-23 DIAGNOSIS — G500 Trigeminal neuralgia: Secondary | ICD-10-CM | POA: Diagnosis not present

## 2023-05-23 DIAGNOSIS — D72829 Elevated white blood cell count, unspecified: Secondary | ICD-10-CM | POA: Diagnosis not present

## 2023-05-23 DIAGNOSIS — E538 Deficiency of other specified B group vitamins: Secondary | ICD-10-CM | POA: Insufficient documentation

## 2023-05-23 DIAGNOSIS — N390 Urinary tract infection, site not specified: Secondary | ICD-10-CM | POA: Diagnosis not present

## 2023-05-23 DIAGNOSIS — D649 Anemia, unspecified: Secondary | ICD-10-CM | POA: Diagnosis not present

## 2023-05-23 LAB — VITAMIN B12: Vitamin B-12: 110 pg/mL — ABNORMAL LOW (ref 180–914)

## 2023-05-23 LAB — CBC
HCT: 29.9 % — ABNORMAL LOW (ref 39.0–52.0)
Hemoglobin: 9.2 g/dL — ABNORMAL LOW (ref 13.0–17.0)
MCH: 26.5 pg (ref 26.0–34.0)
MCHC: 30.8 g/dL (ref 30.0–36.0)
MCV: 86.2 fL (ref 80.0–100.0)
Platelets: 293 10*3/uL (ref 150–400)
RBC: 3.47 MIL/uL — ABNORMAL LOW (ref 4.22–5.81)
RDW: 16.8 % — ABNORMAL HIGH (ref 11.5–15.5)
WBC: 11.3 10*3/uL — ABNORMAL HIGH (ref 4.0–10.5)
nRBC: 0 % (ref 0.0–0.2)

## 2023-05-23 LAB — FERRITIN: Ferritin: 246 ng/mL (ref 24–336)

## 2023-05-23 LAB — RENAL FUNCTION PANEL
Albumin: 2.6 g/dL — ABNORMAL LOW (ref 3.5–5.0)
Anion gap: 10 (ref 5–15)
BUN: 19 mg/dL (ref 6–20)
CO2: 26 mmol/L (ref 22–32)
Calcium: 8.4 mg/dL — ABNORMAL LOW (ref 8.9–10.3)
Chloride: 105 mmol/L (ref 98–111)
Creatinine, Ser: 0.84 mg/dL (ref 0.61–1.24)
GFR, Estimated: >60 mL/min (ref >60)
Glucose, Bld: 104 mg/dL — ABNORMAL HIGH (ref 70–99)
Phosphorus: 3.2 mg/dL (ref 2.5–4.6)
Potassium: 3.3 mmol/L — ABNORMAL LOW (ref 3.5–5.1)
Sodium: 141 mmol/L (ref 135–145)

## 2023-05-23 LAB — RETICULOCYTES
Immature Retic Fract: 12.6 % (ref 2.3–15.9)
RBC.: 3.44 MIL/uL — ABNORMAL LOW (ref 4.22–5.81)
Retic Count, Absolute: 42.3 10*3/uL (ref 19.0–186.0)
Retic Ct Pct: 1.2 % (ref 0.4–3.1)

## 2023-05-23 LAB — MAGNESIUM: Magnesium: 1.9 mg/dL (ref 1.7–2.4)

## 2023-05-23 LAB — IRON AND TIBC
Iron: 8 ug/dL — ABNORMAL LOW (ref 45–182)
Saturation Ratios: 4 % — ABNORMAL LOW (ref 17.9–39.5)
TIBC: 231 ug/dL — ABNORMAL LOW (ref 250–450)
UIBC: 223 ug/dL

## 2023-05-23 LAB — FOLATE: Folate: 6.6 ng/mL (ref >5.9)

## 2023-05-23 MED ORDER — VITAMIN B-12 1000 MCG PO TABS
1000.0000 ug | ORAL_TABLET | Freq: Every day | ORAL | Status: DC
  Administered 2023-05-24: 1000 ug via ORAL
  Filled 2023-05-23: qty 1

## 2023-05-23 MED ORDER — CYANOCOBALAMIN 1000 MCG/ML IJ SOLN
2000.0000 ug | Freq: Once | INTRAMUSCULAR | Status: AC
  Administered 2023-05-23: 2000 ug via INTRAMUSCULAR
  Filled 2023-05-23 (×2): qty 2

## 2023-05-23 MED ORDER — SODIUM CHLORIDE 0.9 % IV SOLN
250.0000 mg | Freq: Every day | INTRAVENOUS | Status: AC
  Administered 2023-05-23 – 2023-05-24 (×2): 250 mg via INTRAVENOUS
  Filled 2023-05-23 (×2): qty 20

## 2023-05-23 MED ORDER — POTASSIUM CHLORIDE CRYS ER 20 MEQ PO TBCR
40.0000 meq | EXTENDED_RELEASE_TABLET | ORAL | Status: AC
  Administered 2023-05-23 (×2): 40 meq via ORAL
  Filled 2023-05-23 (×2): qty 2

## 2023-05-23 MED ORDER — POTASSIUM CHLORIDE 20 MEQ PO PACK
40.0000 meq | PACK | ORAL | Status: DC
  Filled 2023-05-23: qty 2

## 2023-05-23 MED ORDER — MEDIHONEY WOUND/BURN DRESSING EX PSTE
1.0000 | PASTE | Freq: Every day | CUTANEOUS | Status: DC
  Administered 2023-05-23 – 2023-05-24 (×2): 1 via TOPICAL
  Filled 2023-05-23: qty 44

## 2023-05-23 NOTE — Progress Notes (Signed)
Attempted to order air mattress per wound care orders. Portable equipment states that all beds are dirty and they are waiting for them to be cleaned.

## 2023-05-23 NOTE — Consult Note (Addendum)
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 Known to our department from previous consultations by my associates during previous admissions. Patient and wife have previously been provided education regarding limiting time in the sitting position (or with HOB elevation) and its relationship to ischial tuberosity PI.  Consult performed remotely after review of the EMR including photographs. Wound type:pressure Pressure Injury POA: Yes Measurement: Left IT measures 4cm x 7cm  Bedside RN to measure Right IT and midthoracic PIs with next dressing change. Wound bed: See also photographs provided to EMR by Provider Drainage (amount, consistency, odor) Moderate from ischial tuberosity wounds, scant from thoracic wound Periwound:macerated periwound at IT wounds, dry and intact at thoracic wound Dressing procedure/placement/frequency: I have provided Nursing with guidance for care of these wounds while in house using MediHoney to the midthoracic wound after cleansing, turning and repositioning to keep HOB elevation at or below a 30 degree angle to decrease pressure on the ischial tuberosities and provision of a mattress replacement with low air loss feature to mitigate moisture and redistribution pressure. Bilateral Prevalon boots are provided.Topical care to the bilateral ischial tuberosity wounds will be to apply a 10 minutes soak of pure hypochlorous acid [place], [place] # [phone]) daily followed by filling of the defects with silver hydrofiber (Aquacel Ag+ Amalia [employer] # [phone]).  Recommend consideration of referral to outpatient wound care center for serial debridements and follow up of wounds. If you agree, please initiate referral.  WOC nursing team will not follow, but will remain available to this patient, the nursing and medical teams.  Please re-consult if needed.  Thank  you for inviting [place] to participate in this patient's Plan of Care.  Ladona Mow, MSN, RN, CNS, GNP, Leda Min, [employer], [employer] phone:  [phone]

## 2023-05-23 NOTE — Progress Notes (Signed)
Attempted to reach out to wound care nurse multiple times regarding clarification of wound care orders. Performed wet to dry dressing changing with pre-cleaning with Vashe, and saline soaked gauze for packing.

## 2023-05-23 NOTE — Progress Notes (Signed)
PROGRESS NOTE  [name] JYN:[id number] DOB: 1961-03-10   PCP: [facility]  Patient is from: Home.  DOA: 05/21/2023 LOS: 2  Chief complaints Chief Complaint  Patient presents with   Wound Check   Urinary Tract Infection     Brief Narrative / Interim history: 62 year old M with PMH of MS open and bedbound at baseline) neurogenic bladder with chronic Foley, dysphagia, TGN, pressure ulcer and depression directed to ED for Pseudomonas UTI.  Seen at the [place] about a month ago when he was diagnosed with Pseudomonas UTI for which she received one-time dose of amikacin IV.  Continued to be lethargic and had urine culture done on 7/30 that grew Pseudomonas resistant to amikacin for which he was advised to go to ED.  In ED, stable vitals.  WBC 10.9 with left shift.  Hgb 9.4.  UA with large LE, few bacteria and WBCs.  Hemoccult negative.  Urine culture obtained.  Started on IV Zosyn based on reported sensitivity from outside.  Unfortunately, add on urine culture was not processed.  Sent another urine sample for urine culture on 8/11.  Remain on IV Zosyn.  Improved.    Subjective: Seen and examined earlier this morning.  No major events overnight of this morning.  No complaints.  Objective: Vitals:   05/22/23 1702 05/22/23 2006 05/23/23 0427 05/23/23 0843  BP: (!) 103/57 (!) 91/55 (!) 96/57 107/62  Pulse: 80 85 73 71  Resp: 18   18  Temp: 98.7 F (37.1 C) 99.3 F (37.4 C) 98 F (36.7 C) (!) 97.4 F (36.3 C)  TempSrc:      SpO2: 96% 93% 95% 96%  Weight:      Height:        Examination:  GENERAL: No apparent distress.  Nontoxic. HEENT: MMM.  Vision and hearing grossly intact.  NECK: Supple.  No apparent JVD.  RESP:  No IWOB.  Fair aeration bilaterally. CVS:  RRR. Heart sounds normal.  ABD/GI/GU: BS+. Abd soft, NTND.  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 SKIN: no apparent skin lesion or wound NEURO: Awake, alert and oriented x 4.  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 PSYCH: Calm.  Normal affect.   Procedures:  None  Microbiology summarized: Urine culture pending  Assessment and plan: Principal Problem:   Complicated UTI (urinary tract infection) Active Problems:   Leukocytosis   Normocytic anemia   Multiple sclerosis, secondary progressive (HCC)   Dysphagia   Debility   Pressure ulcer   Trigeminal neuralgia of right side of face   Depression  Complicated urinary tract infection: Patient with chronic Foley due to MS/neurogenic bladder.  Presents with confusion and lethargy which are not specific to UTI.  Outpatient urine culture on 7/30 with Pseudomonas resistant to amikacin.  UA on presentation concerning but not convincing.  Unfortunately, add on urine culture was not processed. Foley catheter exchanged on 8/9. -Continue IV Zosyn pending culture based on urine culture from outside -Send another urine sample for urine culture -Discussed with ID based on urine culture result  Acute metabolic encephalopathy: Presented with confusion and lethargy.  Multifactorial including possible UTI, polypharmacy (Lyrica, Percocet and baclofen) and B12 (110).  Resolved. -Treat treatable causes-UTI and B12 -Reorientation and delirium precaution  Iron deficiency anemia/vitamin B12 deficiency: H&H relatively stable.  Iron saturation 4%.  B12 110. Recent Labs    11/07/22 0734 11/09/22 0353 11/10/22 0626 11/11/22 0606 11/20/22 0424 11/21/22 0300 12/16/22 1145 05/21/23 0924 05/22/23 0447 05/23/23 0322  HGB 9.7* 10.9* 11.1* 10.4* 10.9* 10.9*  11.0* 9.4* 9.8* 9.2*  -IV ferric gluconate 250 mg daily x 2 -Vitamin B12 injection 2000 mcg x 1 followed by p.o. 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 He receives Ocrevus injections last given on 7/25.  Reportedly bedbound. -Continue supportive care -Aspiration precautions with elevation head of bed   Trigeminal neuralgia Wife makes note that he has a good regimen currently  that keeps his pain under control. -Continue Lyrica, Lamictal, and Tylenol   Mood disorder -Continue Lexapro and Lamictal  Leukocytosis: Mild and resolved.   Hypokalemia -Monitor replenish as appropriate  Body mass index is 24.36 kg/m.  Pressure skin injury: POA.  No signs of infection. Pressure Injury 05/21/23 Buttocks Posterior;Proximal;Right Unstageable - Full thickness tissue loss in which the base of the injury is covered by slough (yellow, tan, gray, green or brown) and/or eschar (tan, brown or black) in the wound bed. (Active)  05/21/23 1720  Location: Buttocks  Location Orientation: Posterior;Proximal;Right  Staging: Unstageable - Full thickness tissue loss in which the base of the injury is covered by slough (yellow, tan, gray, green or brown) and/or eschar (tan, brown or black) in the wound bed.  Wound Description (Comments):   Present on Admission: Yes     Pressure Injury 05/21/23 Buttocks Left Unstageable - Full thickness tissue loss in which the base of the injury is covered by slough (yellow, tan, gray, green or brown) and/or eschar (tan, brown or black) in the wound bed. (Active)  05/21/23 1720  Location: Buttocks  Location Orientation: Left  Staging: Unstageable - Full thickness tissue loss in which the base of the injury is covered by slough (yellow, tan, gray, green or brown) and/or eschar (tan, brown or black) in the wound bed.  Wound Description (Comments):   Present on Admission: Yes   DVT prophylaxis:  enoxaparin (LOVENOX) injection 40 mg Start: 05/21/23 2200  Code Status: DNR/DNI Family Communication: Patient's wife at bedside Level of care: Telemetry Medical Status is: Inpatient Remains inpatient appropriate because: Complicated UTI   Final disposition: Home Consultants:  None  55 minutes with more than 50% spent in reviewing records, counseling patient/family and coordinating care.   Sch Meds:  Scheduled Meds:  acetaminophen  1,000 mg Oral  TID   ascorbic acid  500 mg Oral BID   Chlorhexidine Gluconate Cloth  6 each Topical Daily   cyanocobalamin  2,000 mcg Intramuscular Once   Followed by   Melene Muller ON 05/24/2023] vitamin B-12  1,000 mcg Oral Daily   enoxaparin (LOVENOX) injection  40 mg Subcutaneous Q24H   escitalopram  10 mg Oral Daily   feeding supplement  237 mL Oral BID BM   lamoTRIgine  100 mg Oral BID   lamoTRIgine  200 mg Oral QHS   leptospermum manuka honey  1 Application Topical Daily   modafinil  100 mg Oral q AM   potassium chloride  40 mEq Oral Q4H   pregabalin  150 mg Oral TID   saccharomyces boulardii  250 mg Oral BID   sodium chloride flush  3 mL Intravenous Q12H   Continuous Infusions:  ferric gluconate (FERRLECIT) IVPB     piperacillin-tazobactam (ZOSYN)  IV 3.375 g (05/23/23 1223)   PRN Meds:.acetaminophen **OR** acetaminophen, albuterol, baclofen, food thickener, ondansetron **OR** ondansetron (ZOFRAN) IV, oxyCODONE-acetaminophen, polyvinyl alcohol  Antimicrobials: Anti-infectives (From admission, onward)    Start     Dose/Rate Route Frequency Ordered Stop   05/21/23 2000  piperacillin-tazobactam (ZOSYN) IVPB 3.375 g  3.375 g 12.5 mL/hr over 240 Minutes Intravenous Every 8 hours 05/21/23 1902     05/21/23 1415  piperacillin-tazobactam (ZOSYN) IVPB 3.375 g        3.375 g 12.5 mL/hr over 240 Minutes Intravenous Once 05/21/23 1407 05/21/23 1810        I have personally reviewed the following labs and images: CBC: Recent Labs  Lab 05/21/23 0924 05/22/23 0447 05/23/23 0322  WBC 10.9* 10.0 11.3*  NEUTROABS 9.4*  --   --   HGB 9.4* 9.8* 9.2*  HCT 31.2* 31.9* 29.9*  MCV 88.9 87.9 86.2  PLT 314 263 293   BMP &GFR Recent Labs  Lab 05/21/23 0924 05/22/23 0447 05/23/23 0322  NA 139 138 141  K 3.6 3.7 3.3*  CL 100 102 105  CO2 30 27 26   GLUCOSE 81 80 104*  BUN 13 11 19   CREATININE 0.71 0.62 0.84  CALCIUM 8.7* 8.3* 8.4*  MG  --   --  1.9  PHOS  --   --  3.2   Estimated  Creatinine Clearance: 96.6 mL/min (by C-G formula based on SCr of 0.84 mg/dL). Liver & Pancreas: Recent Labs  Lab 05/21/23 0924 05/23/23 0322  AST 10*  --   ALT 9  --   ALKPHOS 108  --   BILITOT 0.6  --   PROT 5.6*  --   ALBUMIN 2.9* 2.6*   No results for input(s): "LIPASE", "AMYLASE" in the last 168 hours. No results for input(s): "AMMONIA" in the last 168 hours. Diabetic: No results for input(s): "HGBA1C" in the last 72 hours. No results for input(s): "GLUCAP" in the last 168 hours. Cardiac Enzymes: No results for input(s): "CKTOTAL", "CKMB", "CKMBINDEX", "TROPONINI" in the last 168 hours. No results for input(s): "PROBNP" in the last 8760 hours. Coagulation Profile: No results for input(s): "INR", "PROTIME" in the last 168 hours. Thyroid Function Tests: No results for input(s): "TSH", "T4TOTAL", "FREET4", "T3FREE", "THYROIDAB" in the last 72 hours. Lipid Profile: No results for input(s): "CHOL", "HDL", "LDLCALC", "TRIG", "CHOLHDL", "LDLDIRECT" in the last 72 hours. Anemia Panel: Recent Labs    05/23/23 0322  VITAMINB12 110*  FOLATE 6.6  FERRITIN 246  TIBC 231*  IRON 8*  RETICCTPCT 1.2   Urine analysis:    Component Value Date/Time   COLORURINE YELLOW 05/21/2023 0934   APPEARANCEUR CLOUDY (A) 05/21/2023 0934   APPEARANCEUR Clear 07/29/2020 1429   LABSPEC 1.002 (L) 05/21/2023 0934   PHURINE 7.0 05/21/2023 0934   GLUCOSEU NEGATIVE 05/21/2023 0934   HGBUR SMALL (A) 05/21/2023 0934   BILIRUBINUR NEGATIVE 05/21/2023 0934   BILIRUBINUR Negative 07/29/2020 1429   KETONESUR NEGATIVE 05/21/2023 0934   PROTEINUR NEGATIVE 05/21/2023 0934   UROBILINOGEN 0.2 09/12/2018 1534   NITRITE NEGATIVE 05/21/2023 0934   LEUKOCYTESUR LARGE (A) 05/21/2023 0934   Sepsis Labs: Invalid input(s): "PROCALCITONIN", "LACTICIDVEN"  Microbiology: No results found for this or any previous visit (from the past 240 hour(s)).  Radiology Studies: No results found.     T.   [facility]  If 7PM-7AM, please contact night-coverage [email or web address] 05/23/2023, 12:31 PM

## 2023-05-23 NOTE — Consult Note (Signed)
WOC Nurse Consult Note: Connected with Bedside RN Laurena Bering for clarification of wound care orders for bilateral ischial tuberosities. Written orders modified for clarity, specifically for removal of Vashe moistened gauze after soaking and prior to patting wound bed and surrounding skin dry..  WOC nursing team will not follow, but will remain available to this patient, the nursing and medical teams.  Please re-consult if needed.  Thank you for inviting [place] to participate in this patient's Plan of Care.  Ladona Mow, MSN, RN, CNS, GNP, Leda Min, [employer], [employer] phone:  [phone]

## 2023-05-24 DIAGNOSIS — G9341 Metabolic encephalopathy: Secondary | ICD-10-CM | POA: Diagnosis not present

## 2023-05-24 DIAGNOSIS — T83511A Infection and inflammatory reaction due to indwelling urethral catheter, initial encounter: Secondary | ICD-10-CM

## 2023-05-24 DIAGNOSIS — N319 Neuromuscular dysfunction of bladder, unspecified: Secondary | ICD-10-CM

## 2023-05-24 DIAGNOSIS — G35 Multiple sclerosis: Secondary | ICD-10-CM | POA: Diagnosis not present

## 2023-05-24 DIAGNOSIS — N390 Urinary tract infection, site not specified: Secondary | ICD-10-CM | POA: Diagnosis not present

## 2023-05-24 DIAGNOSIS — E538 Deficiency of other specified B group vitamins: Secondary | ICD-10-CM

## 2023-05-24 MED ORDER — CYANOCOBALAMIN 1000 MCG PO TABS: 1000.0000 ug | ORAL_TABLET | Freq: Every day | ORAL | Status: DC

## 2023-05-24 MED ORDER — FOSFOMYCIN TROMETHAMINE 3 G PO PACK
3.0000 g | PACK | Freq: Once | ORAL | Status: AC
  Administered 2023-05-24: 3 g via ORAL
  Filled 2023-05-24: qty 3

## 2023-05-24 MED ORDER — FERROUS SULFATE 325 (65 FE) MG PO TBEC: 325.0000 mg | DELAYED_RELEASE_TABLET | Freq: Two times a day (BID) | ORAL | 1 refills | Status: DC

## 2023-05-24 NOTE — Plan of Care (Signed)

## 2023-05-24 NOTE — Discharge Summary (Signed)
Physician Discharge Summary  [name]:[id number] DOB: 1961/05/16 DOA: 05/21/2023  PCP: [facility]  Admit date: 05/21/2023 Discharge date: 05/24/2023 Admitted From: Home Disposition: Home Recommendations for Outpatient Follow-up:  Follow up with PCP in 1 to 2 weeks Check CBC and CMP 1 to 2 weeks Please follow up on the following pending results: None  [facility] Equipment[facility]Devices: Patient has appropriate DME  Discharge Condition: Stable CODE STATUS: DNR/DNI  Follow-up Information     [facility] Schedule an appointment as soon as possible for a visit in 1 week(s).   Contact information: [address] [facility] [place] [address] [phone]         [facility] Follow up.   Why: [facility] will be providing home health services.  They plan to visit tomorrow to follow up regarding wound care needs.                Hospital course 62 year old M with PMH of MS open and bedbound at baseline) neurogenic bladder with chronic Foley, dysphagia, TGN, pressure ulcer and depression directed to ED for Pseudomonas UTI.  Seen at the [place] about a month ago when he was diagnosed with Pseudomonas UTI for which she received one-time dose of amikacin IV.  Continued to be lethargic and had urine culture done on 7/30 that grew Pseudomonas resistant to amikacin for which he was advised to go to ED.  In ED, stable vitals.  WBC 10.9 with left shift.  Hgb 9.4.  UA with large LE, few bacteria and WBCs.  Hemoccult negative.  Urine culture obtained.  Started on IV Zosyn based on reported sensitivity from outside.   Unfortunately, add on urine culture was not processed.  Sent another urine sample for urine culture on 8/11 that grew multiple species.  Patient received IV Zosyn for 3 days.  Symptoms resolved.  Completed 6 days of antibiotic course with p.o. fosfomycin 3 g x 1 on the day of discharge.   Patient  received IV ferric gluconate 250 mg daily x 2 and vitamin B12 injection 2000 mcg x 1, discharge on p.o. ferrous sulfate and multivitamin for iron deficiency and B12 deficiency anemia.  Patient is at risk for polypharmacy/encephalopathy on Percocet, Lyrica and baclofen but benefit might outweigh risk given his condition.  See individual problem list below for more.   Problems addressed during this hospitalization Principal Problem:   Complicated UTI (urinary tract infection) Active Problems:   Leukocytosis   Normocytic anemia   Multiple sclerosis, secondary progressive (HCC)   Dysphagia   Debility   Pressure ulcer   Trigeminal neuralgia of right side of face   Depression   Neurogenic bladder   Catheter-associated urinary tract infection (HCC)   Acute metabolic encephalopathy   Vitamin B12 deficiency   Complicated urinary tract infection: Patient with chronic Foley due to MS/neurogenic bladder.  Presents with confusion and lethargy which are not specific to UTI.  Outpatient urine culture on 7/30 with Pseudomonas resistant to amikacin.  UA on presentation concerning but not convincing.  Unfortunately, add on urine culture was not processed.  Repeat urine culture on 8/11 with multiple species.  Foley catheter exchanged on 8/9. -Completed 6 days of antibiotic course as above -Patient is at risk for polypharmacy which could contribute to his presentation.  Acute metabolic encephalopathy: Presented with confusion and lethargy.  Multifactorial including possible UTI, polypharmacy (Lyrica, Percocet and baclofen) and B12 (110).  Resolved. -Treat treatable causes-UTI  and B12 -Reorientation and delirium precaution   Iron deficiency anemia/vitamin B12 deficiency: H&H relatively stable.  Iron saturation 4%.  B12 110. Recent Labs (within last 365 days)              Recent Labs    11/07/22 0734 11/09/22 0353 11/10/22 0626 11/11/22 0606 11/20/22 0424 11/21/22 0300 12/16/22 1145  05/21/23 0924 05/22/23 0447 05/23/23 0322  HGB 9.7* 10.9* 11.1* 10.4* 10.9* 10.9* 11.0* 9.4* 9.8* 9.2*    -Received IV ferric gluconate 250 mg daily x 2 and discharged on p.o. iron -Received Vitamin B12 injection 2000 mcg x 1 and discharged on p.o. B12 -Repeat CBC in 1 to 2 weeks -Check B12 in 1 months.  May benefit from monthly B12 injection.   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 He receives Ocrevus injections last given on 7/25.  Reportedly bedbound. -Continue supportive care -Aspiration precautions   Trigeminal neuralgia: Wife makes note that he has a good regimen currently that keeps his pain under control. -Continue Lyrica, Lamictal, and Tylenol   Mood disorder -Continue Lexapro and Lamictal   Leukocytosis: Mild and resolved.   Hypokalemia: Resolved. -Monitor replenish as appropriate   Pressure skin injury: POA Pressure Injury 08/20/22 Ischial tuberosity Right Deep Tissue Pressure Injury - Purple or maroon localized area of discolored intact skin or blood-filled blister due to damage of underlying soft tissue from pressure and/or shear. (Active)  08/20/22 1412  Location: Ischial tuberosity  Location Orientation: Right  Staging: Deep Tissue Pressure Injury - Purple or maroon localized area of discolored intact skin or blood-filled blister due to damage of underlying soft tissue from pressure and/or shear.  Wound Description (Comments):   Present on Admission: Yes  Dressing Type Silver hydrofiber;Gauze (Comment);Foam - Lift dressing to assess site every shift 05/24/23 0500     Pressure Injury 10/19/22 Ischial tuberosity Left Deep Tissue Pressure Injury - Purple or maroon localized area of discolored intact skin or blood-filled blister due to damage of underlying soft tissue from pressure and/or shear. (Active)  10/19/22 2250  Location: Ischial tuberosity  Location Orientation: Left  Staging: Deep Tissue Pressure Injury - Purple or  maroon localized area of discolored intact skin or blood-filled blister due to damage of underlying soft tissue from pressure and/or shear.  Wound Description (Comments):   Present on Admission: Yes     Pressure Injury 10/19/22 Vertebral column Medial Unstageable - Full thickness tissue loss in which the base of the injury is covered by slough (yellow, tan, gray, green or brown) and/or eschar (tan, brown or black) in the wound bed. (Active)  10/19/22 2250  Location: Vertebral column  Location Orientation: Medial  Staging: Unstageable - Full thickness tissue loss in which the base of the injury is covered by slough (yellow, tan, gray, green or brown) and/or eschar (tan, brown or black) in the wound bed.  Wound Description (Comments):   Present on Admission: Yes     Pressure Injury 11/07/22 Vertebral column Medial;Mid Unstageable - Full thickness tissue loss in which the base of the injury is covered by slough (yellow, tan, gray, green or brown) and/or eschar (tan, brown or black) in the wound bed. (Active)  11/07/22 0000  Location: Vertebral column  Location Orientation: Medial;Mid  Staging: Unstageable - Full thickness tissue loss in which the base of the injury is covered by slough (yellow, tan, gray, green or brown) and/or eschar (tan, brown or black) in the wound bed.  Wound Description (Comments):   Present on  Admission: Yes  Dressing Type Foam - Lift dressing to assess site every shift 05/24/23 0500     Pressure Injury 11/13/22 Vertebral column Lower;Mid Stage 2 -  Partial thickness loss of dermis presenting as a shallow open injury with a red, pink wound bed without slough. Healing (Active)  11/13/22 0730  Location: Vertebral column  Location Orientation: Lower;Mid  Staging: Stage 2 -  Partial thickness loss of dermis presenting as a shallow open injury with a red, pink wound bed without slough.  Wound Description (Comments): Healing  Present on Admission: Yes  Dressing Type Foam -  Lift dressing to assess site every shift 05/24/23 0500     Pressure Injury 05/21/23 Buttocks Posterior;Proximal;Right Unstageable - Full thickness tissue loss in which the base of the injury is covered by slough (yellow, tan, gray, green or brown) and/or eschar (tan, brown or black) in the wound bed. (Active)  05/21/23 1720  Location: Buttocks  Location Orientation: Posterior;Proximal;Right  Staging: Unstageable - Full thickness tissue loss in which the base of the injury is covered by slough (yellow, tan, gray, green or brown) and/or eschar (tan, brown or black) in the wound bed.  Wound Description (Comments):   Present on Admission: Yes  Dressing Type Foam - Lift dressing to assess site every shift 05/24/23 0500     Pressure Injury 05/21/23 Buttocks Left Unstageable - Full thickness tissue loss in which the base of the injury is covered by slough (yellow, tan, gray, green or brown) and/or eschar (tan, brown or black) in the wound bed. (Active)  05/21/23 1720  Location: Buttocks  Location Orientation: Left  Staging: Unstageable - Full thickness tissue loss in which the base of the injury is covered by slough (yellow, tan, gray, green or brown) and/or eschar (tan, brown or black) in the wound bed.  Wound Description (Comments):   Present on Admission: Yes  Dressing Type Silver hydrofiber;Gauze (Comment);Foam - Lift dressing to assess site every shift 05/24/23 0500    Time spent 35 minutes  Vital signs Vitals:   05/23/23 1650 05/23/23 1951 05/24/23 0434 05/24/23 0753  BP: 123/73 (!) 94/58 110/67 (!) 97/57  Pulse: 89 79 67 71  Temp: 98.3 F (36.8 C) 98.2 F (36.8 C) 98.2 F (36.8 C) 98.1 F (36.7 C)  Resp: 17 16 17 17   Height:      Weight:      SpO2: 99% 93% 95% 96%  TempSrc:  Oral Oral Oral  BMI (Calculated):         Discharge exam  GENERAL: No apparent distress.  Nontoxic. HEENT: MMM.  Vision and hearing grossly intact.  NECK: Supple.  No apparent JVD.  RESP:  No IWOB.   Fair aeration bilaterally. CVS:  RRR. Heart sounds normal.  ABD/GI/GU: BS+. Abd soft, NTND.  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 SKIN: no apparent skin lesion or wound NEURO: Awake, alert and oriented x 4.  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 PSYCH: Calm. Normal affect.   Discharge Instructions Discharge Instructions     Diet - low sodium heart healthy   Complete by: As directed    Discharge instructions   Complete by: As directed    It has been a pleasure taking care of you!  You were hospitalized due to urinary tract infection for which you have been treated with IV Zosyn and oral fosfomycin.  Your symptoms improved.  Please take your medications as prescribed.  Follow-up with your primary care doctor in 1 to 2 weeks or sooner if needed.  Note  that oxycodone, Lyrica and baclofen can increase your risk of drowsiness, sedation, confusion and respiratory distress.  We recommend you review these medications with your prescriber at your follow-up.   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  Remove gauze moistened with Vashe and used for soaking and pat wound and surrounding skin dry. Fill defect with silver hydrofiber (Aquacel Ag+ Advantage, Lawson # [id number]), top with dry gauze, secure with silicone foam or ABD/Medipore tape.   Wound care  Daily      Comments: Wound care to mid thoracic Stage 3 Pressure injury: Cleanse with NS, pat dry. Apply MediHoney, top with dry gauze and cover/secure with silicone foam. Change daily.   Increase activity slowly   Complete by: As directed       Allergies as of 05/24/2023       Reactions   Gadopiclenol Nausea And Vomiting   Zosyn [piperacillin-tazobactam In Dex]    Not  allergic per family         Medication List     TAKE these medications    acetaminophen 500 MG tablet Commonly known as: TYLENOL Take 1,000 mg by mouth in the morning, at noon, and at bedtime.   albuterol 108 (90 Base) MCG/ACT inhaler Commonly known as: VENTOLIN HFA Inhale 2 puffs into the lungs every 6 (six) hours as needed.   ascorbic acid 500 MG tablet Commonly known as: VITAMIN C Take 500 mg by mouth 2 (two) times daily.   baclofen 10 MG tablet Commonly known as: LIORESAL Take 10 mg by mouth daily as needed for muscle spasms.   cyanocobalamin 1000 MCG tablet Take 1 tablet (1,000 mcg total) by mouth daily. Start taking on: May 25, 2023   escitalopram 10 MG tablet Commonly known as: LEXAPRO Take 1 tablet (10 mg total) by mouth daily.   ferrous sulfate 325 (65 FE) MG EC tablet Take 1 tablet (325 mg total) by mouth 2 (two) times daily.   ibuprofen 200 MG tablet Commonly known as: ADVIL Take 600 mg by mouth as needed for headache or moderate pain.   lamoTRIgine 100 MG tablet Commonly known as: LAMICTAL Take 1 tablet (100 mg total) by mouth 3 (three) times daily. What changed:  how much to take when to take this additional instructions   leptospermum manuka honey Pste paste Apply 1 Application topically daily.   modafinil 100 MG tablet Commonly known as: PROVIGIL Take 1 tablet (100 mg total) by mouth in the morning.   ocrelizumab 600 mg in sodium chloride 0.9 % 500 mL Inject 600 mg into the vein every 6 (six) months.   oxyCODONE-acetaminophen 5-325 MG tablet Commonly known as: Percocet Take 1 tablet by mouth every 12 (twelve) hours as needed for severe pain. What changed: when to take this   potassium chloride SA 20 MEQ tablet Commonly known as: KLOR-CON M Take 2 tablets (40 mEq total) by mouth daily.   pregabalin 150 MG capsule Commonly known as: LYRICA Take 1 capsule (150 mg total) by mouth 3 (three) times daily.   Santyl 250 UNIT/GM  ointment Generic drug: collagenase Apply 1 Application topically daily.   THERATEARS OP Place 2-3  drops into both eyes as needed (dry eye). What changed: Another medication with the same name was removed. Continue taking this medication, and follow the directions you see here.   zinc gluconate 50 MG tablet Take 50 mg by mouth at bedtime.               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  Remove gauze moistened with Vashe and used for soaking and pat wound and surrounding skin dry. Fill defect with silver hydrofiber (Aquacel Ag+ Advantage, Lawson # [id number]), top with dry gauze, secure with silicone foam or ABD/Medipore tape.   Wound care  Daily      Comments: Wound care to mid thoracic Stage 3 Pressure injury: Cleanse with NS, pat dry. Apply MediHoney, top with dry gauze and cover/secure with silicone foam. Change daily.   05/24/23 0934            Consultations: None  Procedures/Studies:   DG Pelvis Portable  Result Date: 05/21/2023 CLINICAL DATA:  Sacral ulcers. EXAM: PORTABLE PELVIS 1 VIEWS COMPARISON:  None Available. FINDINGS: Osteopenia. Overlapping cardiac leads. No fracture or dislocation. Grossly preserved joint spaces. No definite erosive changes identified although the inferior aspect of the right pubic bone clipped off the edge of the film. Overall study is somewhat limited for decubitus ulcer evaluation. If there is further concern additional cross-sectional imaging study is recommended for further sensitivity. IMPRESSION: Osteopenia. Limited x-ray. No obvious erosive changes at this time but  recommend additional workup when appropriate for further sensitivity with cross-sectional imaging Electronically Signed   By: Karen Kays M.D.   On: 05/21/2023 11:59       The results of significant diagnostics from this hospitalization (including imaging, microbiology, ancillary and laboratory) are listed below for reference.     Microbiology: Recent Results (from the past 240 hour(s))  Culture, OB Urine     Status: Abnormal   Collection Time: 05/23/23  7:43 AM   Specimen: OB Catheterized; Urine  Result Value Ref Range Status   Specimen Description OB CATHETERIZED  Final   Special Requests NONE  Final   Culture (A)  Final    MULTIPLE SPECIES PRESENT, SUGGEST RECOLLECTION NO GROUP B STREP (S.AGALACTIAE) ISOLATED Performed at [facility], [address], [place], [place] [address]    Report Status 05/24/2023 FINAL  Final     Labs:  CBC: Recent Labs  Lab 05/21/23 0924 05/22/23 0447 05/23/23 0322 05/24/23 0203  WBC 10.9* 10.0 11.3* 8.8  NEUTROABS 9.4*  --   --   --   HGB 9.4* 9.8* 9.2* 9.4*  HCT 31.2* 31.9* 29.9* 30.9*  MCV 88.9 87.9 86.2 88.3  PLT 314 263 293 272   BMP &GFR Recent Labs  Lab 05/21/23 0924 05/22/23 0447 05/23/23 0322 05/24/23 0203  NA 139 138 141 140  K 3.6 3.7 3.3* 4.0  CL 100 102 105 107  CO2 30 27 26 24   GLUCOSE 81 80 104* 119*  BUN 13 11 19 18   CREATININE 0.71  0.62 0.84 0.69  CALCIUM 8.7* 8.3* 8.4* 8.1*  MG  --   --  1.9 2.1  PHOS  --   --  3.2 2.4*   Estimated Creatinine Clearance: 101.4 mL/min (by C-G formula based on SCr of 0.69 mg/dL). Liver & Pancreas: Recent Labs  Lab 05/21/23 0924 05/23/23 0322 05/24/23 0203  AST 10*  --   --   ALT 9  --   --   ALKPHOS 108  --   --   BILITOT 0.6  --   --   PROT 5.6*  --   --   ALBUMIN 2.9* 2.6* 2.4*   No results for input(s): "LIPASE", "AMYLASE" in the last 168 hours. No results for input(s): "AMMONIA" in the last 168 hours. Diabetic: No results for input(s): "HGBA1C" in the  last 72 hours. No results for input(s): "GLUCAP" in the last 168 hours. Cardiac Enzymes: No results for input(s): "CKTOTAL", "CKMB", "CKMBINDEX", "TROPONINI" in the last 168 hours. No results for input(s): "PROBNP" in the last 8760 hours. Coagulation Profile: No results for input(s): "INR", "PROTIME" in the last 168 hours. Thyroid Function Tests: No results for input(s): "TSH", "T4TOTAL", "FREET4", "T3FREE", "THYROIDAB" in the last 72 hours. Lipid Profile: No results for input(s): "CHOL", "HDL", "LDLCALC", "TRIG", "CHOLHDL", "LDLDIRECT" in the last 72 hours. Anemia Panel: Recent Labs    05/23/23 0322  VITAMINB12 110*  FOLATE 6.6  FERRITIN 246  TIBC 231*  IRON 8*  RETICCTPCT 1.2   Urine analysis:    Component Value Date/Time   COLORURINE YELLOW 05/21/2023 0934   APPEARANCEUR CLOUDY (A) 05/21/2023 0934   APPEARANCEUR Clear 07/29/2020 1429   LABSPEC 1.002 (L) 05/21/2023 0934   PHURINE 7.0 05/21/2023 0934   GLUCOSEU NEGATIVE 05/21/2023 0934   HGBUR SMALL (A) 05/21/2023 0934   BILIRUBINUR NEGATIVE 05/21/2023 0934   BILIRUBINUR Negative 07/29/2020 1429   KETONESUR NEGATIVE 05/21/2023 0934   PROTEINUR NEGATIVE 05/21/2023 0934   UROBILINOGEN 0.2 09/12/2018 1534   NITRITE NEGATIVE 05/21/2023 0934   LEUKOCYTESUR LARGE (A) 05/21/2023 0934   Sepsis Labs: Invalid input(s): "PROCALCITONIN", "LACTICIDVEN"   SIGNED:  Almon Hercules, MD  [facility] 05/24/2023, 2:56 PM

## 2023-05-24 NOTE — TOC Initial Note (Signed)
Transition of Care [facility]) - Initial/Assessment Note    Patient Details  Name: [name] MRN: [id number] Date of Birth: Mar 07, 1961  Transition of Care [facility]) CM/SW Contact:    Janae Bridgeman, RN Phone Number: 05/24/2023, 11:51 AM  Clinical Narrative:                 Patient was admitted to the hospital with complicated UTI.  The patient lives with the wife, present at bedside who provides 24 care to the patient in the home.  The patient will discharge home with home health services - currently active with [facility] - I called [facility] and Bjorn Loser, plans to call the RN and have the follow up visit at the home tomorrow.    Bedside nursing to assist patient's wife on discharge back into his home wheelchair and wife to provide transportation home by car.  Expected Discharge Plan: Home w Home Health Services Barriers to Discharge: No Barriers Identified   Patient Goals and CMS Choice Patient states their goals for this hospitalization and ongoing recovery are:: To return home CMS Medicare.gov Compare Post Acute Care list provided to:: Patient Choice offered to / list presented to : Patient [place] ownership interest in [facility].provided to:: Patient    Expected Discharge Plan and Services   Discharge Planning Services: CM Consult Post Acute Care Choice: Home Health, Resumption of Svcs/PTA Provider Living arrangements for the past 2 months: Single Family Home Expected Discharge Date: 05/24/23                         HH Arranged: RN HH Agency: [facility] Date [facility] Agency Contacted: 05/24/23 Time HH Agency Contacted: [id number] Representative spoke with at [facility] Agency: Bjorn Loser, RNCM with [facility]  Prior Living Arrangements/Services Living arrangements for the past 2 months: Single Family Home   Patient language and need for interpreter reviewed:: Yes Do you feel safe going back to the place where you live?: Yes      Need for Family  Participation in Patient Care: Yes (Comment) Care giver support system in place?: Yes (comment) Current home services: DME, Home RN (Patient has all DME at home -) Criminal Activity/Legal Involvement Pertinent to Current Situation/Hospitalization: No - Comment as needed  Activities of Daily Living Home Assistive Devices/Equipment: Hospital bed, Sling (specify type) (ceiling lift) ADL Screening (condition at time of admission) Patient's cognitive ability adequate to safely complete daily activities?: Yes Is the patient deaf or have difficulty hearing?: No Does the patient have difficulty seeing, even when wearing glasses/contacts?: Yes (reading glasses) Does the patient have difficulty concentrating, remembering, or making decisions?: Yes Patient able to express need for assistance with ADLs?: Yes Does the patient have difficulty dressing or bathing?: Yes Independently performs ADLs?: No Communication: Needs assistance Is this a change from baseline?: Pre-admission baseline Dressing (OT): Dependent Is this a change from baseline?: Pre-admission baseline Grooming: Dependent Is this a change from baseline?: Change from baseline, expected to last <3 days Feeding: Dependent Is this a change from baseline?: Change from baseline, expected to last <3 days Bathing: Dependent Is this a change from baseline?: Pre-admission baseline Toileting: Dependent Is this a change from baseline?: Pre-admission baseline In/Out Bed: Dependent Is this a change from baseline?: Pre-admission baseline Walks in Home: Dependent Is this a change from baseline?: Pre-admission baseline Does the patient have difficulty walking or climbing stairs?: No Weakness of Legs: Both Weakness of Arms/Hands: Both  Permission  Sought/Granted Permission sought to share information with : Case Production designer, theatre/television/film, Oceanographer granted to share information with : Yes, Verbal Permission Granted     Permission  granted to share info w AGENCY: [facility] - active with [facility] - Joni Reining  Permission granted to share info w Relationship: spouse is at bedside     Emotional Assessment Appearance:: Appears stated age Attitude/Demeanor/Rapport: Gracious Affect (typically observed): Accepting Orientation: : Oriented to Self, Oriented to Place, Oriented to  Time, Oriented to Situation Alcohol / Substance Use: Not Applicable Psych Involvement: No (comment)  Admission diagnosis:  Lower urinary tract infectious disease [N39.0] Complicated UTI (urinary tract infection) [N39.0] Pressure ulcers of skin of multiple topographic sites [L89.90] Patient Active Problem List   Diagnosis Date Noted   Acute metabolic encephalopathy 05/23/2023   Vitamin B12 deficiency 05/23/2023   Complicated UTI (urinary tract infection) 05/21/2023   Leukocytosis 05/21/2023   Dysphagia 05/21/2023   Debility 05/21/2023   Severe malnutrition (HCC) 11/18/2022   Normocytic anemia 11/09/2022   Trigeminal neuralgia pain 11/07/2022   Protein-calorie malnutrition, severe 10/23/2022   Pneumonia of left lower lobe due to infectious organism 10/22/2022   Trigeminal neuralgia of right side of face 10/19/2022   Multiple sclerosis (HCC) 08/20/2022   Pressure ulcer 08/20/2022   Right trigeminal neuralgia 08/17/2022   Sepsis (HCC) 03/10/2022   Catheter-associated urinary tract infection (HCC) 03/09/2022   Urinary tract infection 10/13/2020   Lactic acidosis 02/23/2020   Sepsis due to Enterococcus (HCC) 02/23/2020   Enterococcus UTI 02/23/2020   Hyperkalemia 02/23/2020   Penile bleeding 02/23/2020   Acute respiratory failure with hypoxemia (HCC) 02/23/2020   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 [place] [address] Phone: [phone] Fax: [phone]  [facility] - [place], [place] - [address] [place] [address] Phone: [phone] Fax: [phone]  [facility] - [place], [place] - [address] Herbie D[place] [address] Phone: [phone] Fax: [phone]  [facility] [address] [place] [address] Phone: [phone] Fax: [phone]  [facility] - [place], [place] - [address] [place] [address] Phone: [phone] Fax: [phone]  [facility] [address] [place] [address] Phone: [phone] Fax: [phone]  [id number] - [place], [place] - [address] AT [address]NER OF [facility] [address] [place] [address] Phone: [phone] Fax: [phone]     Social Determinants of Health (SDOH) Social History: SDOH Screenings   Food Insecurity: No Food Insecurity (05/21/2023)  Housing: Low Risk  (05/21/2023)  Transportation Needs: No Transportation Needs (05/21/2023)  Utilities: Not At Risk (05/21/2023)  Depression (PHQ2-9): Low Risk  (07/04/2020)  Social Connections: Unknown (07/07/2022)   [facility] from [facility]  Tobacco Use: Medium Risk (05/21/2023)   SDOH Interventions:  Readmission Risk Interventions    05/24/2023   11:51 AM 08/21/2022    1:23 PM  Readmission Risk Prevention Plan  Post  Dischage Appt  Complete  Medication Screening  Complete  Transportation Screening Complete Complete  PCP or Specialist Appt within 5-7 Days Complete   Home Care Screening Complete   Medication Review (RN CM) Complete

## 2023-05-31 ENCOUNTER — Encounter (HOSPITAL_BASED_OUTPATIENT_CLINIC_OR_DEPARTMENT_OTHER): Payer: No Typology Code available for payment source | Admitting: Internal Medicine

## 2023-05-31 DIAGNOSIS — G35 Multiple sclerosis: Secondary | ICD-10-CM | POA: Diagnosis not present

## 2023-05-31 DIAGNOSIS — N319 Neuromuscular dysfunction of bladder, unspecified: Secondary | ICD-10-CM | POA: Diagnosis not present

## 2023-05-31 DIAGNOSIS — L89102 Pressure ulcer of unspecified part of back, stage 2: Secondary | ICD-10-CM | POA: Diagnosis not present

## 2023-05-31 DIAGNOSIS — L89314 Pressure ulcer of right buttock, stage 4: Secondary | ICD-10-CM

## 2023-05-31 DIAGNOSIS — L89312 Pressure ulcer of right buttock, stage 2: Secondary | ICD-10-CM | POA: Diagnosis not present

## 2023-05-31 DIAGNOSIS — L89103 Pressure ulcer of unspecified part of back, stage 3: Secondary | ICD-10-CM | POA: Diagnosis not present

## 2023-05-31 DIAGNOSIS — E440 Moderate protein-calorie malnutrition: Secondary | ICD-10-CM | POA: Diagnosis not present

## 2023-05-31 DIAGNOSIS — L89324 Pressure ulcer of left buttock, stage 4: Secondary | ICD-10-CM

## 2023-06-01 NOTE — Progress Notes (Addendum)
[name] ([id number]) [id number] Page 1 of 10 Visit Report for 05/31/2023 Chief Complaint Document Details Patient Name: Date of Service: [name]. 05/31/2023 12:30 PM Medical Record Number: [id number] Patient Account Number: [id number] Date of Birth/Sex: Treating RN: [birth year] (62 y.o. M) Primary Care Provider: [facility] Other Clinician: Referring Provider: Treating Provider/Extender: [name] in Treatment: 1 Information Obtained from: Patient Chief Complaint 05/18/2023; wounds to the left and right buttocks and upper back Electronic Signature(s) Signed: 05/31/2023 3:33:08 PM By: Geralyn Corwin DO Entered By: Geralyn Corwin on 05/31/2023 10:46:37 -------------------------------------------------------------------------------- Debridement Details Patient Name: Date of Service: [name], [name] 05/31/2023 12:30 PM Medical Record Number: [id number] Patient Account Number: [id number] Date of Birth/Sex: Treating RN: 1960/12/04 (62 y.o. Tammy Sours Primary Care Provider: [facility] Other Clinician: Referring Provider: Treating Provider/Extender: [name] in Treatment: 1 Debridement Performed for Assessment: Wound #2 Proximal,Medial,Posterior Back Performed By: Clinician Shawn Stall, RN Debridement Type: Chemical/Enzymatic/Mechanical Agent Used: Santyl Level of Consciousness (Pre-procedure): Awake and Alert Pre-procedure Verification/Time Out No Taken: Percent of Wound Bed Debrided: Bleeding: None Response to Treatment: Procedure was tolerated well Level of Consciousness (Post- Awake and Alert procedure): Post Debridement Measurements of Total Wound Length: (cm) 2.2 Stage: Category/Stage III Width: (cm) 1.3 Depth: (cm) 0.1 Volume: (cm) 0.225 Character of Wound/Ulcer Post Debridement: Requires Further Debridement Post Procedure Diagnosis Same as Pre-procedure Electronic  Signature(s) Signed: 05/31/2023 3:33:08 PM By: Geralyn Corwin DO Signed: 05/31/2023 5:55:06 PM By: Shawn Stall RN, BSN Entered By: Shawn Stall on 05/31/2023 10:45:26 Knerr, Kian F ([id number]) [id number] Page 2 of 10 -------------------------------------------------------------------------------- Debridement Details Patient Name: Date of Service: [name] F. 05/31/2023 12:30 PM Medical Record Number: [id number] Patient Account Number: [id number] Date of Birth/Sex: Treating RN: 04-May-1961 (62 y.o. Tammy Sours Primary Care Provider: [facility] Other Clinician: Referring Provider: Treating Provider/Extender: [name] in Treatment: 1 Debridement Performed for Assessment: Wound #5 Distal,Midline Back Performed By: Clinician Shawn Stall, RN Debridement Type: Chemical/Enzymatic/Mechanical Agent Used: Santyl Level of Consciousness (Pre-procedure): Awake and Alert Pre-procedure Verification/Time Out No Taken: Percent of Wound Bed Debrided: Bleeding: None Response to Treatment: Procedure was tolerated well Level of Consciousness (Post- Awake and Alert procedure): Post Debridement Measurements of Total Wound Length: (cm) 0.5 Stage: Category/Stage II Width: (cm) 0.3 Depth: (cm) 0.1 Volume: (cm) 0.012 Character of Wound/Ulcer Post Debridement: Requires Further Debridement Post Procedure Diagnosis Same as Pre-procedure Electronic Signature(s) Signed: 05/31/2023 3:33:08 PM By: Geralyn Corwin DO Signed: 05/31/2023 5:55:06 PM By: Shawn Stall RN, BSN Entered By: Shawn Stall on 05/31/2023 10:45:48 -------------------------------------------------------------------------------- Debridement Details Patient Name: Date of Service: [name], [name] 05/31/2023 12:30 PM Medical Record Number: [id number] Patient Account Number: [id number] Date of Birth/Sex: Treating RN: 1961-06-13 (62 y.o. Tammy Sours Primary  Care Provider: [facility] Other Clinician: Referring Provider: Treating Provider/Extender: [name] in Treatment: 1 Debridement Performed for Assessment: Wound #6 Right,Lateral Gluteus Performed By: Clinician Shawn Stall, RN Debridement Type: Chemical/Enzymatic/Mechanical Agent Used: Santyl Level of Consciousness (Pre-procedure): Awake and Alert Pre-procedure Verification/Time Out No Taken: Percent of Wound Bed Debrided: Bleeding: None Response to Treatment: Procedure was tolerated well Level of Consciousness (Post- Awake and Alert procedure): Post Debridement Measurements of Total Wound Length: (cm) 1.5 Stage: Category/Stage II Width: (cm) 3.2 Frizell, Daley F ([id number]) [id number] Page 3 of 10 Width: (cm) 3.2 Depth: (cm) 0.1 Volume: (cm) 0.377 Character of Wound/Ulcer Post Debridement: Requires Further Debridement Post Procedure  Diagnosis Same as Pre-procedure Electronic Signature(s) Signed: 05/31/2023 3:33:08 PM By: Geralyn Corwin DO Signed: 05/31/2023 5:55:06 PM By: Shawn Stall RN, BSN Entered By: Shawn Stall on 05/31/2023 10:46:08 -------------------------------------------------------------------------------- HPI Details Patient Name: Date of Service: [name], [name] 05/31/2023 12:30 PM Medical Record Number: [id number] Patient Account Number: [id number] Date of Birth/Sex: Treating RN: [birth year] (62 y.o. M) Primary Care Provider: [facility] Other Clinician: Referring Provider: Treating Provider/Extender: [name] in Treatment: 1 History of Present Illness HPI Description: 05/18/2023 [name] is a 62 year old male with a past medical history of multiple sclerosis and neurogenic bladder with chronic indwelling Foley catheter that presents to the clinic for a 1 year history of nonhealing ulcers to the bilateral ischium As well as upper back. He has an air mattress. He spends  about 50% of the time in the bed and 50% of the time in the wheelchair. He does not have a Roho cushion. He has been using Medihoney and Santyl to the wound beds. He currently denies systemic signs of infection. Of note he was hospitalized on 2 occasions in January 2024 for trigeminal neuralgia. He has lost a significant amount of weight due to this and his oral intake has declined. He was evaluated for PEG tube but was not a candidate. 8/19; patient presents for follow-up. Patient has home health. He has been using Dakin's wet-to-dry dressings to the ischial wounds and Santyl to the back wound. Patient was recently hospitalized for UTI and received IV Zosyn for 3 days followed by fosfomycin. He completed 6 days of antibiotics. He currently denies systemic signs of infection. He also had a portable pelvic x-ray that did not show any obvious erosive changes. Wife is presentAnd she states she is trying to aggressively offload the wound beds by repositioning him on his sides. Unfortunately he has developed two wounds 1 to the right buttocks and 1 to the midline back. Electronic Signature(s) Signed: 05/31/2023 3:33:08 PM By: Geralyn Corwin DO Entered By: Geralyn Corwin on 05/31/2023 11:03:42 -------------------------------------------------------------------------------- Physical Exam Details Patient Name: Date of Service: [name], [name] 05/31/2023 12:30 PM Medical Record Number: [id number] Patient Account Number: [id number] Date of Birth/Sex: Treating RN: [birth year] (62 y.o. M) Primary Care Provider: [facility] Other Clinician: Referring Provider: Treating Provider/Extender: [name] in Treatment: 1 Constitutional respirations regular, non-labored and within target range for patient.. Cardiovascular 2+ dorsalis pedis/posterior tibialis pulses. Psychiatric pleasant and cooperative. [name] ([id number]) [id number] Page 4 of  10 Notes Extensive wounds to the ischium's bilaterally that probes close to bone however there is a layer of granulation tissue over this. T the upper back there is an o open wound with nonviable tissue and scant granulation tissue present. Just below this is an area of skin breakdown. T the right Clute there is an abrasion o noted. No surrounding signs of soft tissue infection including increased warmth, erythema or purulent drainage. Electronic Signature(s) Signed: 05/31/2023 3:33:08 PM By: Geralyn Corwin DO Entered By: Geralyn Corwin on 05/31/2023 11:04:10 -------------------------------------------------------------------------------- Physician Orders Details Patient Name: Date of Service: [name], [name] 05/31/2023 12:30 PM Medical Record Number: [id number] Patient Account Number: [id number] Date of Birth/Sex: Treating RN: January 31, 1961 (62 y.o. Tammy Sours Primary Care Provider: [facility] Other Clinician: Referring Provider: Treating Provider/Extender: [name] in Treatment: 1 Verbal / Phone Orders: No Diagnosis Coding ICD-10 Coding Code Description L89.314 Pressure ulcer of right buttock, stage 4 L89.324 Pressure ulcer of  left buttock, stage 4 L89.103 Pressure ulcer of unspecified part of back, stage 3 E44.0 Moderate protein-calorie malnutrition G35 Multiple sclerosis N31.9 Neuromuscular dysfunction of bladder, unspecified L89.312 Pressure ulcer of right buttock, stage 2 L89.102 Pressure ulcer of unspecified part of back, stage 2 Follow-up Appointments ppointment in 2 weeks. - 06/17/2023 1015 room 9 Dr. Mikey Bussing Thursday (already scheduled) Return A HOYER extra time 60 minutes Anesthetic (In clinic) Topical Lidocaine 4% applied to wound bed Bathing/ Shower/ Hygiene May shower with protection but do not get wound dressing(s) wet. Protect dressing(s) with water repellant cover (for example, large plastic bag) or a cast cover and may then  take shower. Off-Loading Turn and reposition every 2 hours Other: - only an hour up to eat and back to bed. Turn every 2 hours. Home Health New wound care orders this week; continue Home Health for wound care. May utilize formulary equivalent dressing for wound treatment orders unless otherwise specified. - Dakin's solution wet to dry to wounds #4 and #6. Santyl and hydrofera blue bordered foam to both back wounds. Other Home Health Orders/Instructions: - [facility] Wound Treatment Wound #2 - Back Wound Laterality: Medial, Posterior, Proximal Cleanser: Vashe 5.8 (oz) (Home Health) 1 x Per Day/30 Days Discharge Instructions: Cleanse the wound with Vashe prior to applying a clean dressing using gauze sponges, not tissue or cotton balls. Peri-Wound Care: Skin Prep (Home Health) 1 x Per Day/30 Days Discharge Instructions: Use skin prep as directed Topical: Santyl Collagenase Ointment, 30 (gm), tube 1 x Per Day/30 Days Prim Dressing: Hydrofera Blue Ready Transfer Foam, 4x5 (in/in) (Generic) 1 x Per Day/30 Days ary Discharge Instructions: Apply to wound bed as instructed [name], Koleson F ([id number]) [id number] Page 5 of 10 Secondary Dressing: Zetuvit Plus Silicone Border Dressing 4x4 (in/in) (Home Health) 1 x Per Day/30 Days Discharge Instructions: Apply silicone border over primary dressing as directed. Wound #3 - Gluteus Wound Laterality: Left Cleanser: Vashe 5.8 (oz) (Home Health) (Generic) 1 x Per Day/30 Days Discharge Instructions: Cleanse the wound with Vashe prior to applying a clean dressing using gauze sponges, not tissue or cotton balls. Prim Dressing: Dakin's Solution 0.25%, 16 (oz) (Home Health) 1 x Per Day/30 Days ary Discharge Instructions: Moisten gauze with Dakin's solution Secondary Dressing: Zetuvit Plus Silicone Border Dressing 7x7(in/in) (Home Health) 1 x Per Day/30 Days Discharge Instructions: Apply silicone border over primary  dressing as directed. Wound #4 - Gluteus Wound Laterality: Right, Posterior Cleanser: Vashe 5.8 (oz) (Home Health) (Generic) 1 x Per Day/30 Days Discharge Instructions: Cleanse the wound with Vashe prior to applying a clean dressing using gauze sponges, not tissue or cotton balls. Prim Dressing: Dakin's Solution 0.25%, 16 (oz) (Home Health) 1 x Per Day/30 Days ary Discharge Instructions: Moisten gauze with Dakin's solution Secondary Dressing: Zetuvit Plus Silicone Border Dressing 7x7(in/in) (Home Health) 1 x Per Day/30 Days Discharge Instructions: Apply silicone border over primary dressing as directed. Wound #5 - Back Wound Laterality: Midline, Distal Cleanser: Vashe 5.8 (oz) (Home Health) 1 x Per Day/30 Days Discharge Instructions: Cleanse the wound with Vashe prior to applying a clean dressing using gauze sponges, not tissue or cotton balls. Peri-Wound Care: Skin Prep (Home Health) 1 x Per Day/30 Days Discharge Instructions: Use skin prep as directed Topical: Santyl Collagenase Ointment, 30 (gm), tube 1 x Per Day/30 Days Prim Dressing: Hydrofera Blue Ready Transfer Foam, 4x5 (in/in) (Generic) 1 x Per Day/30 Days ary Discharge Instructions: Apply to wound bed as instructed Secondary Dressing: Zetuvit Plus Silicone Border Dressing  4x4 (in/in) (Home Health) 1 x Per Day/30 Days Discharge Instructions: Apply silicone border over primary dressing as directed. Wound #6 - Gluteus Wound Laterality: Right, Lateral Cleanser: Vashe 5.8 (oz) (Home Health) 1 x Per Day/30 Days Discharge Instructions: Cleanse the wound with Vashe prior to applying a clean dressing using gauze sponges, not tissue or cotton balls. Peri-Wound Care: Skin Prep (Home Health) 1 x Per Day/30 Days Discharge Instructions: Use skin prep as directed Topical: Santyl Collagenase Ointment, 30 (gm), tube 1 x Per Day/30 Days Prim Dressing: Hydrofera Blue Ready Transfer Foam, 4x5 (in/in) (Generic) 1 x Per Day/30 Days ary Discharge  Instructions: Apply to wound bed as instructed Secondary Dressing: Zetuvit Plus Silicone Border Dressing 4x4 (in/in) (Home Health) 1 x Per Day/30 Days Discharge Instructions: Apply silicone border over primary dressing as directed. Electronic Signature(s) Signed: 05/31/2023 3:33:08 PM By: Geralyn Corwin DO Signed: 05/31/2023 5:55:06 PM By: Shawn Stall RN, BSN Entered By: Shawn Stall on 05/31/2023 11:06:35 -------------------------------------------------------------------------------- Problem List Details Patient Name: Date of Service: [name], [name] 05/31/2023 12:30 PM Medical Record Number: [id number] Patient Account Number: [id number] Date of Birth/Sex: Treating RN: 04-21-1961 (62 y.o. M) Primary Care Provider: [facility] Other Clinician: Philipp [name] ([id number]) [id number] Page 6 of 10 Referring Provider: Treating Provider/Extender: [name] in Treatment: 1 Active Problems ICD-10 Encounter Code Description Active Date MDM Diagnosis L89.314 Pressure ulcer of right buttock, stage 4 05/18/2023 No Yes L89.324 Pressure ulcer of left buttock, stage 4 05/18/2023 No Yes L89.103 Pressure ulcer of unspecified part of back, stage 3 05/18/2023 No Yes E44.0 Moderate protein-calorie malnutrition 05/18/2023 No Yes G35 Multiple sclerosis 05/18/2023 No Yes N31.9 Neuromuscular dysfunction of bladder, unspecified 05/18/2023 No Yes L89.312 Pressure ulcer of right buttock, stage 2 05/31/2023 No Yes L89.102 Pressure ulcer of unspecified part of back, stage 2 05/31/2023 No Yes Inactive Problems Resolved Problems Electronic Signature(s) Signed: 05/31/2023 3:33:08 PM By: Geralyn Corwin DO Entered By: Geralyn Corwin on 05/31/2023 10:46:20 -------------------------------------------------------------------------------- Progress Note Details Patient Name: Date of Service: [name], [name] 05/31/2023 12:30 PM Medical Record Number:  [id number] Patient Account Number: [id number] Date of Birth/Sex: Treating RN: [birth year] (62 y.o. M) Primary Care Provider: [facility] Other Clinician: Referring Provider: Treating Provider/Extender: [name] in Treatment: 1 Subjective Chief Complaint Information obtained from Patient 05/18/2023; wounds to the left and right buttocks and upper back History of Present Illness (HPI) [name], Ansh F ([id number]) [id number] Page 7 of 10 05/18/2023 Mr. Odarius Bauermeister is a 62 year old male with a past medical history of multiple sclerosis and neurogenic bladder with chronic indwelling Foley catheter that presents to the clinic for a 1 year history of nonhealing ulcers to the bilateral ischium As well as upper back. He has an air mattress. He spends about 50% of the time in the bed and 50% of the time in the wheelchair. He does not have a Roho cushion. He has been using Medihoney and Santyl to the wound beds. He currently denies systemic signs of infection. Of note he was hospitalized on 2 occasions in January 2024 for trigeminal neuralgia. He has lost a significant amount of weight due to this and his oral intake has declined. He was evaluated for PEG tube but was not a candidate. 8/19; patient presents for follow-up. Patient has home health. He has been using Dakin's wet-to-dry dressings to the ischial wounds and Santyl to the back wound. Patient was recently hospitalized for UTI and received IV Zosyn for 3  days followed by fosfomycin. He completed 6 days of antibiotics. He currently denies systemic signs of infection. He also had a portable pelvic x-ray that did not show any obvious erosive changes. Wife is presentAnd she states she is trying to aggressively offload the wound beds by repositioning him on his sides. Unfortunately he has developed two wounds 1 to the right buttocks and 1 to the midline back. Patient History Information obtained from  Chart. Medical History Respiratory Patient has history of Asthma Medical A Surgical History Notes nd Ear/Nose/Mouth/Throat Dysphagia, migraines Neurologic Trigeminal neuralgia Psychiatric Depression Objective Constitutional respirations regular, non-labored and within target range for patient.. Vitals Time Taken: 12:56 PM, Temperature: 98.6 F, Pulse: 61 bpm, Respiratory Rate: 18 breaths/min, Blood Pressure: 100/66 mmHg. Cardiovascular 2+ dorsalis pedis/posterior tibialis pulses. Psychiatric pleasant and cooperative. General Notes: Extensive wounds to the ischium's bilaterally that probes close to bone however there is a layer of granulation tissue over this. T the upper o back there is an open wound with nonviable tissue and scant granulation tissue present. Just below this is an area of skin breakdown. T the right Clute there is o an abrasion noted. No surrounding signs of soft tissue infection including increased warmth, erythema or purulent drainage. Integumentary (Hair, Skin) Wound #2 status is Open. Original cause of wound was Gradually Appeared. The date acquired was: 05/14/2022. The wound has been in treatment 1 weeks. The wound is located on the Proximal,Medial,Posterior Back. The wound measures 2.2cm length x 1.3cm width x 0.1cm depth; 2.246cm^2 area and 0.225cm^3 volume. There is no tunneling or undermining noted. There is a medium amount of serosanguineous drainage noted. The wound margin is distinct with the outline attached to the wound base. There is no granulation within the wound bed. There is a large (67-100%) amount of necrotic tissue within the wound bed including Adherent Slough. The periwound skin appearance did not exhibit: Callus, Crepitus, Excoriation, Induration, Rash, Scarring, Dry/Scaly, Maceration, Atrophie Blanche, Cyanosis, Ecchymosis, Hemosiderin Staining, Mottled, Pallor, Rubor, Erythema. Periwound temperature was noted as No Abnormality. Wound #3 status  is Open. Original cause of wound was Gradually Appeared. The date acquired was: 05/14/2022. The wound has been in treatment 1 weeks. The wound is located on the Left Gluteus. The wound measures 7.2cm length x 2.5cm width x 3cm depth; 14.137cm^2 area and 42.412cm^3 volume. There is bone, muscle, and Fat Layer (Subcutaneous Tissue) exposed. There is undermining starting at 9:00 and ending at 12:00 with a maximum distance of 6.5cm. There is a medium amount of serosanguineous drainage noted. The wound margin is distinct with the outline attached to the wound base. There is medium (34-66%) red, pink granulation within the wound bed. There is a medium (34-66%) amount of necrotic tissue within the wound bed including Adherent Slough. The periwound skin appearance exhibited: Scarring, Hemosiderin Staining. The periwound skin appearance did not exhibit: Dry/Scaly, Maceration. Periwound temperature was noted as No Abnormality. Wound #4 status is Open. Original cause of wound was Pressure Injury. The date acquired was: 05/14/2022. The wound has been in treatment 1 weeks. The wound is located on the Right,Posterior Gluteus. The wound measures 5.5cm length x 9cm width x 3cm depth; 38.877cm^2 area and 116.632cm^3 volume. There is bone, muscle, and Fat Layer (Subcutaneous Tissue) exposed. There is no tunneling noted, however, there is undermining starting at 9:00 and ending at 1:00 with a maximum distance of 5.9cm. There is a medium amount of serous drainage noted. The wound margin is distinct with the outline attached to the wound  base. There is medium (34-66%) red, pink, friable granulation within the wound bed. There is a small (1-33%) amount of necrotic tissue within the wound bed including Adherent Slough and Necrosis of Muscle. The periwound skin appearance had no abnormalities noted for texture. The periwound skin appearance had no abnormalities noted for moisture. The periwound skin appearance had no abnormalities  noted for color. Wound #5 status is Open. Original cause of wound was Pressure Injury. The date acquired was: 05/13/2023. The wound is located on the Distal,Midline Back. The wound measures 0.5cm length x 0.3cm width x 0.1cm depth; 0.118cm^2 area and 0.012cm^3 volume. There is no tunneling or undermining noted. There is a medium amount of serosanguineous drainage noted. There is medium (34-66%) red granulation within the wound bed. There is a medium (34-66%) amount of necrotic tissue within the wound bed including Adherent Slough. The periwound skin appearance did not exhibit: Callus, Crepitus, Excoriation, Induration, Rash, Scarring, Dry/Scaly, Maceration, Atrophie Blanche, Cyanosis, Ecchymosis, Hemosiderin Staining, Mottled, Pallor, Rubor, Erythema. Wound #6 status is Open. Original cause of wound was Pressure Injury. The date acquired was: 05/13/2023. The wound is located on the Right,Lateral Gluteus. The wound measures 1.5cm length x 3.2cm width x 0.1cm depth; 3.77cm^2 area and 0.377cm^3 volume. There is Fat Layer (Subcutaneous Tissue) exposed. There is no tunneling or undermining noted. There is a medium amount of serosanguineous drainage noted. The wound margin is distinct with the outline attached to the wound base. There is medium (34-66%) pink granulation within the wound bed. There is a medium (34-66%) amount of necrotic tissue within the [place], Collyn F ([id number]) [id number] Page 8 of 10 wound bed including Adherent Slough. The periwound skin appearance did not exhibit: Callus, Crepitus, Excoriation, Induration, Rash, Scarring, Dry/Scaly, Maceration, Atrophie Blanche, Cyanosis, Ecchymosis, Hemosiderin Staining, Mottled, Pallor, Rubor, Erythema. Assessment Active Problems ICD-10 Pressure ulcer of right buttock, stage 4 Pressure ulcer of left buttock, stage 4 Pressure ulcer of unspecified part of back, stage 3 Moderate protein-calorie malnutrition Multiple  sclerosis Neuromuscular dysfunction of bladder, unspecified Pressure ulcer of right buttock, stage 2 Pressure ulcer of unspecified part of back, stage 2 Patient's wounds are stable. Pelvic x-ray Did not show any obvious erosive changes. I recommended continuing Dakin's wet-to-dry dressings to the ischial wounds and Santyl and add Hydrofera Blue to the back wound. Unfortunately has developed 2 new wounds from pressure and shear. T these I also o recommended Santyl and Hydrofera Blue. Continue aggressive offloading. Follow-up in 2 weeks. Procedures Wound #2 Pre-procedure diagnosis of Wound #2 is a Pressure Ulcer located on the Proximal,Medial,Posterior Back . There was a Chemical/Enzymatic/Mechanical debridement performed by Shawn Stall, RN.Marland Kitchen Agent used was [employer]. There was no bleeding. The procedure was tolerated well. Post Debridement Measurements: 2.2cm length x 1.3cm width x 0.1cm depth; 0.225cm^3 volume. Post debridement Stage noted as Category/Stage III. Character of Wound/Ulcer Post Debridement requires further debridement. Post procedure Diagnosis Wound #2: Same as Pre-Procedure Wound #5 Pre-procedure diagnosis of Wound #5 is a Pressure Ulcer located on the Distal,Midline Back . There was a Chemical/Enzymatic/Mechanical debridement performed by Shawn Stall, RN.Marland Kitchen Agent used was [employer]. There was no bleeding. The procedure was tolerated well. Post Debridement Measurements: 0.5cm length x 0.3cm width x 0.1cm depth; 0.012cm^3 volume. Post debridement Stage noted as Category/Stage II. Character of Wound/Ulcer Post Debridement requires further debridement. 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 There was a Chemical/Enzymatic/Mechanical debridement performed by  Shawn Stall, RN.Marland Kitchen Agent used was [employer]. There was no bleeding. The procedure was tolerated well. Post  Debridement Measurements: 1.5cm length x 3.2cm width x 0.1cm depth; 0.377cm^3 volume. Post debridement Stage noted as Category/Stage II. Character of Wound/Ulcer Post Debridement requires further debridement. Post procedure Diagnosis Wound #6: Same as Pre-Procedure Plan Follow-up Appointments: Return Appointment in 2 weeks. - 06/17/2023 1015 room 9 Dr. Mikey Bussing Thursday (already scheduled) HOYER extra time 60 minutes Anesthetic: (In clinic) Topical Lidocaine 4% applied to wound bed Bathing/ Shower/ Hygiene: May shower with protection but do not get wound dressing(s) wet. Protect dressing(s) with water repellant cover (for example, large plastic bag) or a cast cover and may then take shower. Off-Loading: Turn and reposition every 2 hours Other: - only an hour up to eat and back to bed. Turn every 2 hours. Home Health: New wound care orders this week; continue Home Health for wound care. May utilize formulary equivalent dressing for wound treatment orders unless otherwise specified. - Dakin's solution wet to dry to wounds #4 and #6. Santyl and hydrofera blue bordered foam to both back wounds. Other Home Health Orders/Instructions: - [facility] WOUND #2: - Back Wound Laterality: Medial, Posterior, Proximal Cleanser: Vashe 5.8 (oz) (Home Health) 1 x Per Day/30 Days Discharge Instructions: Cleanse the wound with Vashe prior to applying a clean dressing using gauze sponges, not tissue or cotton balls. Peri-Wound Care: Skin Prep (Home Health) 1 x Per Day/30 Days Discharge Instructions: Use skin prep as directed Topical: Santyl Collagenase Ointment, 30 (gm), tube 1 x Per Day/30 Days Prim Dressing: Hydrofera Blue Ready Transfer Foam, 4x5 (in/in) (Generic) 1 x Per Day/30 Days ary Discharge Instructions: Apply to wound bed as instructed Secondary Dressing: Zetuvit Plus Silicone Border Dressing 4x4 (in/in) (Home Health) 1 x Per Day/30 Days Discharge Instructions: Apply silicone border over  primary dressing as directed. WOUND #3: - Gluteus Wound Laterality: Left Cleanser: Vashe 5.8 (oz) (Home Health) (Generic) 1 x Per Day/30 Days DAKAI, HASKILL ([id number]) [id number] Page 9 of 10 Discharge Instructions: Cleanse the wound with Vashe prior to applying a clean dressing using gauze sponges, not tissue or cotton balls. Prim Dressing: Dakin's Solution 0.25%, 16 (oz) (Home Health) 1 x Per Day/30 Days ary Discharge Instructions: Moisten gauze with Dakin's solution Secondary Dressing: Zetuvit Plus Silicone Border Dressing 7x7(in/in) (Home Health) 1 x Per Day/30 Days Discharge Instructions: Apply silicone border over primary dressing as directed. WOUND #4: - Gluteus Wound Laterality: Right, Posterior Cleanser: Vashe 5.8 (oz) (Home Health) (Generic) 1 x Per Day/30 Days Discharge Instructions: Cleanse the wound with Vashe prior to applying a clean dressing using gauze sponges, not tissue or cotton balls. Prim Dressing: Dakin's Solution 0.25%, 16 (oz) (Home Health) 1 x Per Day/30 Days ary Discharge Instructions: Moisten gauze with Dakin's solution Secondary Dressing: Zetuvit Plus Silicone Border Dressing 7x7(in/in) (Home Health) 1 x Per Day/30 Days Discharge Instructions: Apply silicone border over primary dressing as directed. WOUND #5: - Back Wound Laterality: Midline, Distal Cleanser: Vashe 5.8 (oz) (Home Health) 1 x Per Day/30 Days Discharge Instructions: Cleanse the wound with Vashe prior to applying a clean dressing using gauze sponges, not tissue or cotton balls. Peri-Wound Care: Skin Prep (Home Health) 1 x Per Day/30 Days Discharge Instructions: Use skin prep as directed Topical: Santyl Collagenase Ointment, 30 (gm), tube 1 x Per Day/30 Days Prim Dressing: Hydrofera Blue Ready Transfer Foam, 4x5 (in/in) (Generic) 1 x Per Day/30 Days ary Discharge Instructions: Apply to wound bed as instructed Secondary Dressing:  Zetuvit Plus Silicone Border Dressing  4x4 (in/in) (Home Health) 1 x Per Day/30 Days Discharge Instructions: Apply silicone border over primary dressing as directed. WOUND #6: - Gluteus Wound Laterality: Right, Lateral Cleanser: Vashe 5.8 (oz) (Home Health) 1 x Per Day/30 Days Discharge Instructions: Cleanse the wound with Vashe prior to applying a clean dressing using gauze sponges, not tissue or cotton balls. Peri-Wound Care: Skin Prep (Home Health) 1 x Per Day/30 Days Discharge Instructions: Use skin prep as directed Topical: Santyl Collagenase Ointment, 30 (gm), tube 1 x Per Day/30 Days Prim Dressing: Hydrofera Blue Ready Transfer Foam, 4x5 (in/in) (Generic) 1 x Per Day/30 Days ary Discharge Instructions: Apply to wound bed as instructed Secondary Dressing: Zetuvit Plus Silicone Border Dressing 4x4 (in/in) (Home Health) 1 x Per Day/30 Days Discharge Instructions: Apply silicone border over primary dressing as directed. 1. Dakin's wet-to-dry dressings 2. Aggressive offloading 3. Hydrofera Blue and Santyl 4. Follow-up in 2 weeks Electronic Signature(s) Signed: 06/02/2023 7:54:18 PM By: Shawn Stall RN, BSN Signed: 06/07/2023 1:35:31 PM By: Geralyn Corwin DO Previous Signature: 05/31/2023 3:33:08 PM Version By: Geralyn Corwin DO Entered By: Shawn Stall on 06/02/2023 16:49:55 -------------------------------------------------------------------------------- HxROS Details Patient Name: Date of Service: [name], [name] 05/31/2023 12:30 PM Medical Record Number: [id number] Patient Account Number: [id number] Date of Birth/Sex: Treating RN: January 22, 1961 (62 y.o. M) Primary Care Provider: [facility] Other Clinician: Referring Provider: Treating Provider/Extender: [name] in Treatment: 1 Information Obtained From Chart Ear/Nose/Mouth/Throat Medical History: Past Medical History Notes: Dysphagia, migraines Respiratory Medical History: Positive for: Asthma Neurologic Medical History: Past  Medical History Notes: Trigeminal neuralgia Dermody, Marl F ([id number]) [id number] Page 10 of 10 Psychiatric Medical History: Past Medical History Notes: Depression Immunizations Pneumococcal Vaccine: Received Pneumococcal Vaccination: No Implantable Devices No devices added Electronic Signature(s) Signed: 05/31/2023 3:33:08 PM By: Geralyn Corwin DO Entered By: Geralyn Corwin on 05/31/2023 11:04:05 -------------------------------------------------------------------------------- SuperBill Details Patient Name: Date of Service: [name], [name] 05/31/2023 Medical Record Number: [id number] Patient Account Number: [id number] Date of Birth/Sex: Treating RN: [birth year] (62 y.o. Tammy Sours Primary Care Provider: [facility] Other Clinician: Referring Provider: Treating Provider/Extender: [name] in Treatment: 1 Diagnosis Coding ICD-10 Codes Code Description L89.314 Pressure ulcer of right buttock, stage 4 L89.324 Pressure ulcer of left buttock, stage 4 L89.103 Pressure ulcer of unspecified part of back, stage 3 E44.0 Moderate protein-calorie malnutrition G35 Multiple sclerosis N31.9 Neuromuscular dysfunction of bladder, unspecified L89.312 Pressure ulcer of right buttock, stage 2 L89.102 Pressure ulcer of unspecified part of back, stage 2 Facility Procedures : CPT4 Code: 08657846 Description: 96295 - DEBRIDE W/O ANES NON SELECT Modifier: Quantity: 1 Physician Procedures : CPT4 Code Description Modifier [id number] - WC PHYS LEVEL 3 - EST PT ICD-10 Diagnosis Description L89.314 Pressure ulcer of right buttock, stage 4 L89.324 Pressure ulcer of left buttock, stage 4 L89.102 Pressure ulcer of unspecified part of back,  stage 2 L89.103 Pressure ulcer of unspecified part of back, stage 3 Quantity: 1 Electronic Signature(s) Signed: 05/31/2023 3:33:08 PM By: Geralyn Corwin DO Entered By: Geralyn Corwin on  05/31/2023 11:05:19

## 2023-06-03 NOTE — Progress Notes (Signed)
[name] ([id number]) [id number] Page 1 of 13 Visit Report for 05/31/2023 Arrival Information Details Patient Name: Date of Service: [name]. 05/31/2023 12:30 PM Medical Record Number: [id number] Patient Account Number: [id number] Date of Birth/Sex: Treating RN: 02-06-1961 (62 y.o. M) Primary Care Abbigal Radich: [facility] Other Clinician: Referring Javonna Balli: Treating Lavina Resor/Extender: [name] in Treatment: 1 Visit Information History Since Last Visit Added or deleted any medications: No Patient Arrived: Wheel Chair Any new allergies or adverse reactions: No Arrival Time: 12:51 Had Mark fall or experienced change in No Accompanied By: wife activities of daily living that may affect Transfer Assistance: Michiel Sites Lift risk of falls: Patient Identification Verified: Yes Signs or symptoms of abuse/neglect since last visito No Secondary Verification Process Completed: Yes Hospitalized since last visit: No Patient Requires Transmission-Based Precautions: No Implantable device outside of the clinic excluding No Patient Has Alerts: No cellular tissue based products placed in the center since last visit: Has Dressing in Place as Prescribed: Yes Pain Present Now: Yes Electronic Signature(s) Signed: 06/03/2023 5:02:38 PM By: Thayer Dallas Entered By: Thayer Dallas on 05/31/2023 09:56:18 -------------------------------------------------------------------------------- Encounter Discharge Information Details Patient Name: Date of Service: [name], [name]. 05/31/2023 12:30 PM Medical Record Number: [id number] Patient Account Number: [id number] Date of Birth/Sex: Treating RN: Nov 20, 1960 (62 y.o. Tammy Sours Primary Care Missy Baksh: [facility] Other Clinician: Referring Zyon Grout: Treating Danell Verno/Extender: [name] in Treatment: 1 Encounter Discharge Information Items Post Procedure Vitals Discharge  Condition: Stable Temperature ([name]): 98.6 Ambulatory Status: Wheelchair Pulse (bpm): 61 Discharge Destination: Home Respiratory Rate (breaths/min): 16 Transportation: Private Auto Blood Pressure (mmHg): 100/66 Accompanied By: wife Schedule Follow-up Appointment: Yes Clinical Summary of Care: Electronic Signature(s) Signed: 05/31/2023 5:55:06 PM By: Shawn Stall RN, BSN Entered By: Shawn Stall on 05/31/2023 11:08:29 [name], [name] ([id number]) [id number] Page 2 of 13 -------------------------------------------------------------------------------- Lower Extremity Assessment Details Patient Name: Date of Service: [name]. 05/31/2023 12:30 PM Medical Record Number: [id number] Patient Account Number: [id number] Date of Birth/Sex: Treating RN: 15-May-1961 (62 y.o. M) Primary Care Maddoxx Burkitt: [facility] Other Clinician: Referring Jemari Hallum: Treating Serrina Minogue/Extender: [name] in Treatment: 1 Electronic Signature(s) Signed: 06/03/2023 5:02:38 PM By: Thayer Dallas Entered By: Thayer Dallas on 05/31/2023 09:58:22 -------------------------------------------------------------------------------- Multi Wound Chart Details Patient Name: Date of Service: [name], [name]. 05/31/2023 12:30 PM Medical Record Number: [id number] Patient Account Number: [id number] Date of Birth/Sex: Treating RN: August 15, 1961 (62 y.o. M) Primary Care Cassell Voorhies: [facility] Other Clinician: Referring Jheri Mitter: Treating Leora Platt/Extender: [name] in Treatment: 1 Vital Signs Height(in): Pulse(bpm): 61 Weight(lbs): Blood Pressure(mmHg): 100/66 Body Mass Index(BMI): Temperature([name]): 98.6 Respiratory Rate(breaths/min): 18 [2:Photos:] Proximal, Medial, Posterior Back Left Gluteus Right, Posterior Gluteus Wound Location: Gradually Appeared Gradually Appeared Pressure Injury Wounding Event: Pressure Ulcer Pressure Ulcer Pressure  Ulcer Primary Etiology: Asthma Asthma Asthma Comorbid History: 05/14/2022 05/14/2022 05/14/2022 Date Acquired: 1 1 1  Weeks of Treatment: Open Open Open Wound Status: No No No Wound Recurrence: 2.2x1.3x0.1 7.2x2.5x3 5.5x9x3 Measurements L x W x D (cm) 2.246 14.137 38.877 [name] 0.225 42.412 116.632 Volume (cm) : -4.00% -9.10% -67.70% % Reduction in [name]: 47.90% 6.50% -43.70% % Reduction in Volume: 9 9 Starting Position 1 (o'clock): 12 1 Ending Position 1 (o'clock): 6.5 5.9 Maximum Distance 1 (cm): No Yes Yes Undermining: Category/Stage III Category/Stage IV Category/Stage IV Classification: Medium Medium Medium Exudate Mark mount: Serosanguineous Serosanguineous Serous Exudate Type: red, brown red, brown  amber Exudate Color: Distinct, outline attached Distinct, outline attached Distinct, outline attached Wound MarginMeyer [name], [name] ([id number]) [id number] Page 3 of 13 None Present (0%) Medium (34-66%) Medium (34-66%) Granulation Amount: N/Mark Red, Pink Red, Pink, Friable Granulation Quality: Large (67-100%) Medium (34-66%) Small (1-33%) Necrotic Amount: Fascia: No Fat Layer (Subcutaneous Tissue): Yes Fat Layer (Subcutaneous Tissue): Yes Exposed Structures: Fat Layer (Subcutaneous Tissue): No Muscle: Yes Muscle: Yes Tendon: No Bone: Yes Bone: Yes Muscle: No Fascia: No Joint: No Tendon: No Bone: No Joint: No Small (1-33%) N/Mark N/Mark Epithelialization: Chemical/Enzymatic/Mechanical N/Mark N/Mark Debridement: N/Mark N/Mark N/Mark Instrument: None N/Mark N/Mark Bleeding: Debridement Treatment Response: Procedure was tolerated well N/Mark N/Mark Post Debridement Measurements L x 2.2x1.3x0.1 N/Mark N/Mark W x D (cm) 0.225 N/Mark N/Mark Post Debridement Volume: (cm) Category/Stage III N/Mark N/Mark Post Debridement Stage: Excoriation: No Scarring: Yes No Abnormalities Noted Periwound Skin Texture: Induration: No Callus: No Crepitus: No Rash: No Scarring:  No Maceration: No Maceration: No No Abnormalities Noted Periwound Skin Moisture: Dry/Scaly: No Dry/Scaly: No Atrophie Blanche: No Hemosiderin Staining: Yes No Abnormalities Noted Periwound Skin Color: Cyanosis: No Ecchymosis: No Erythema: No Hemosiderin Staining: No Mottled: No Pallor: No Rubor: No No Abnormality No Abnormality N/Mark Temperature: Debridement N/Mark N/Mark Procedures Performed: Wound Number: 5 6 N/Mark Photos: N/Mark Distal, Midline Back Right, Lateral Gluteus N/Mark Wound Location: Pressure Injury Pressure Injury N/Mark Wounding Event: Pressure Ulcer Pressure Ulcer N/Mark Primary Etiology: Asthma Asthma N/Mark Comorbid History: 05/13/2023 05/13/2023 N/Mark Date Acquired: 0 0 N/Mark Weeks of Treatment: Open Open N/Mark Wound Status: No No N/Mark Wound Recurrence: 0.5x0.3x0.1 1.5x3.2x0.1 N/Mark Measurements L x W x D (cm) 0.118 3.77 N/Mark [name] 0.012 0.377 N/Mark Volume (cm) : N/Mark N/Mark N/Mark % Reduction in [name]: N/Mark N/Mark N/Mark % Reduction in Volume: No No N/Mark Undermining: Category/Stage II Category/Stage II N/Mark Classification: Medium Medium N/Mark Exudate Mark mount: Serosanguineous Serosanguineous N/Mark Exudate Type: red, brown red, brown N/Mark Exudate Color: N/Mark Distinct, outline attached N/Mark Wound Margin: Medium (34-66%) Medium (34-66%) N/Mark Granulation Mark mount: Red Pink N/Mark Granulation Quality: Medium (34-66%) Medium (34-66%) N/Mark Necrotic Mark mount: N/Mark Fat Layer (Subcutaneous Tissue): Yes N/Mark Exposed Structures: Fascia: No Tendon: No Muscle: No Joint: No Bone: No None None N/Mark Epithelialization: Chemical/Enzymatic/Mechanical Chemical/Enzymatic/Mechanical N/Mark Debridement: N/Mark N/Mark N/Mark Instrument: None None N/Mark Bleeding: Procedure was tolerated well Procedure was tolerated well N/Mark Debridement Treatment Response: 0.5x0.3x0.1 1.5x3.2x0.1 N/Mark Post Debridement Measurements L x W x D (cm) 0.012 0.377 N/Mark Post Debridement Volume: (cm) Category/Stage II Category/Stage II  N/Mark Post Debridement Stage: Excoriation: No Excoriation: No N/Mark Periwound Skin TexturePhilipp Deputy ([id number]) [id number] Page 4 of 13 Induration: No Induration: No Callus: No Callus: No Crepitus: No Crepitus: No Rash: No Rash: No Scarring: No Scarring: No Maceration: No Maceration: No N/Mark Periwound Skin Moisture: Dry/Scaly: No Dry/Scaly: No Atrophie Blanche: No Atrophie Blanche: No N/Mark Periwound Skin Color: Cyanosis: No Cyanosis: No Ecchymosis: No Ecchymosis: No Erythema: No Erythema: No Hemosiderin Staining: No Hemosiderin Staining: No Mottled: No Mottled: No Pallor: No Pallor: No Rubor: No Rubor: No N/Mark N/Mark N/Mark Temperature: Debridement Debridement N/Mark Procedures Performed: Treatment Notes Electronic Signature(s) Signed: 05/31/2023 3:33:08 PM By: Geralyn Corwin DO Entered By: Geralyn Corwin on 05/31/2023 10:46:27 -------------------------------------------------------------------------------- Multi-Disciplinary Care Plan Details Patient Name: Date of Service: [name], [name]. 05/31/2023 12:30 PM Medical Record Number: [id number] Patient Account Number: [id number] Date of Birth/Sex: Treating RN: 07-04-1961 (62 y.o. Tammy Sours Primary Care Emmitte Surgeon: [facility]  Other Clinician: Referring Derwood Becraft: Treating Gelsey Amyx/Extender: [name] in Treatment: 1 Active Inactive Wound/Skin Impairment Nursing Diagnoses: Knowledge deficit related to smoking impact on wound healing Goals: Patient/caregiver will verbalize understanding of skin care regimen Date Initiated: 05/18/2023 Target Resolution Date: 08/12/2023 Goal Status: Active Interventions: Assess patient/caregiver ability to obtain necessary supplies Assess patient/caregiver ability to perform ulcer/skin care regimen upon admission and as needed Assess ulceration(s) every visit Provide education on ulcer and skin care Screen for  HBO Treatment Activities: Skin care regimen initiated : 05/18/2023 Topical wound management initiated : 05/18/2023 Notes: Electronic Signature(s) Signed: 05/31/2023 5:55:06 PM By: Shawn Stall RN, BSN Entered By: Shawn Stall on 05/31/2023 10:44:14 [name], [name] ([id number]) [id number] Page 5 of 13 -------------------------------------------------------------------------------- Pain Assessment Details Patient Name: Date of Service: [name]. 05/31/2023 12:30 PM Medical Record Number: [id number] Patient Account Number: [id number] Date of Birth/Sex: Treating RN: 05/16/1961 (62 y.o. M) Primary Care Lilyanna Lunt: [facility] Other Clinician: Referring Annamarie Yamaguchi: Treating Graylyn Bunney/Extender: [name] in Treatment: 1 Active Problems Location of Pain Severity and Description of Pain Patient Has Paino Yes Site Locations Pain Location: Generalized Pain, Pain in Ulcers Rate the pain. Current Pain Level: 8 Pain Management and Medication Current Pain Management: Electronic Signature(s) Signed: 06/03/2023 5:02:38 PM By: Thayer Dallas Entered By: Thayer Dallas on 05/31/2023 09:57:03 -------------------------------------------------------------------------------- Patient/Caregiver Education Details Patient Name: Date of Service: [name] 8/19/2024andnbsp12:30 PM Medical Record Number: [id number] Patient Account Number: [id number] Date of Birth/Gender: Treating RN: 07/24/1961 (62 y.o. Tammy Sours Primary Care Physician: [facility] Other Clinician: Referring Physician: Treating Physician/Extender: [name] in Treatment: 1 Education Assessment Education Provided To: Patient and Caregiver Education Topics Provided Wound/Skin Impairment: Handouts: Caring for Your Ulcer Methods: Explain/Verbal Responses: Reinforcements needed [name] ([id number]) [id number]  Page 6 of 13 Electronic Signature(s) Signed: 05/31/2023 5:55:06 PM By: Shawn Stall RN, BSN Entered By: Shawn Stall on 05/31/2023 10:44:31 -------------------------------------------------------------------------------- Wound Assessment Details Patient Name: Date of Service: [name], [name]. 05/31/2023 12:30 PM Medical Record Number: [id number] Patient Account Number: [id number] Date of Birth/Sex: Treating RN: 1961-08-19 (62 y.o. M) Primary Care Ashley Bultema: [facility] Other Clinician: Referring Jyden Kromer: Treating Leigha Olberding/Extender: [name] in Treatment: 1 Wound Status Wound Number: 2 Primary Etiology: Pressure Ulcer Wound Location: Proximal, Medial, Posterior Back Wound Status: Open Wounding Event: Gradually Appeared Comorbid History: Asthma Date Acquired: 05/14/2022 Weeks Of Treatment: 1 Clustered Wound: No Photos Wound Measurements Length: (cm) 2.2 Width: (cm) 1.3 Depth: (cm) 0.1 Area: (cm) 2.246 Volume: (cm) 0.225 % Reduction in Area: -4% % Reduction in Volume: 47.9% Epithelialization: Small (1-33%) Tunneling: No Undermining: No Wound Description Classification: Category/Stage III Wound Margin: Distinct, outline attached Exudate Amount: Medium Exudate Type: Serosanguineous Exudate Color: red, brown Foul Odor After Cleansing: No Slough/Fibrino Yes Wound Bed Granulation Amount: None Present (0%) Exposed Structure Necrotic Amount: Large (67-100%) Fascia Exposed: No Necrotic Quality: Adherent Slough Fat Layer (Subcutaneous Tissue) Exposed: No Tendon Exposed: No Muscle Exposed: No Joint Exposed: No Bone Exposed: No Periwound Skin Texture Texture Color No Abnormalities Noted: No No Abnormalities Noted: No Callus: No Atrophie Blanche: No Crepitus: No Cyanosis: No Excoriation: No Ecchymosis: No Induration: No Erythema: No Rash: No Hemosiderin Staining: No [name], [name] ([id number]) [id number] Page 7  of 13 Scarring: No Mottled: No Pallor: No Moisture Rubor: No No Abnormalities Noted: No Dry / Scaly: No Temperature / Pain Maceration: No Temperature: No Abnormality Treatment Notes Wound #2 (Back) Wound Laterality:  Medial, Posterior, Proximal Cleanser Vashe 5.8 (oz) Discharge Instruction: Cleanse the wound with Vashe prior to applying Mark clean dressing using gauze sponges, not tissue or cotton balls. Peri-Wound Care Skin Prep Discharge Instruction: Use skin prep as directed Topical Santyl Collagenase Ointment, 30 (gm), tube Primary Dressing Hydrofera Blue Ready Transfer Foam, 4x5 (in/in) Discharge Instruction: Apply to wound bed as instructed Secondary Dressing Zetuvit Plus Silicone Border Dressing 4x4 (in/in) Discharge Instruction: Apply silicone border over primary dressing as directed. Secured With Compression Wrap Compression Stockings Facilities manager) Signed: 06/03/2023 5:02:38 PM By: Thayer Dallas Entered By: Thayer Dallas on 05/31/2023 10:18:26 -------------------------------------------------------------------------------- Wound Assessment Details Patient Name: Date of Service: [name], [name]. 05/31/2023 12:30 PM Medical Record Number: [id number] Patient Account Number: [id number] Date of Birth/Sex: Treating RN: [birth year] (62 y.o. M) Primary Care Shireen Rayburn: [facility] Other Clinician: Referring Kimala Horne: Treating Tameem Pullara/Extender: [name] in Treatment: 1 Wound Status Wound Number: 3 Primary Etiology: Pressure Ulcer Wound Location: Left Gluteus Wound Status: Open Wounding Event: Gradually Appeared Comorbid History: Asthma Date Acquired: 05/14/2022 Weeks Of Treatment: 1 Clustered Wound: No Photos [name] ([id number]) [id number] Page 8 of 13 Wound Measurements Length: (cm) 7.2 Width: (cm) 2.5 Depth: (cm) 3 Area: (cm) 14.137 Volume: (cm) 42.412 % Reduction in Area: -9.1% %  Reduction in Volume: 6.5% Undermining: Yes Starting Position (o'clock): 9 Ending Position (o'clock): 12 Maximum Distance: (cm) 6.5 Wound Description Classification: Category/Stage IV Wound Margin: Distinct, outline attached Exudate Amount: Medium Exudate Type: Serosanguineous Exudate Color: red, brown Foul Odor After Cleansing: No Slough/Fibrino No Wound Bed Granulation Amount: Medium (34-66%) Exposed Structure Granulation Quality: Red, Pink Fat Layer (Subcutaneous Tissue) Exposed: Yes Necrotic Amount: Medium (34-66%) Muscle Exposed: Yes Necrotic Quality: Adherent Slough Necrosis of Muscle: No Bone Exposed: Yes Periwound Skin Texture Texture Color No Abnormalities Noted: No No Abnormalities Noted: No Scarring: Yes Hemosiderin Staining: Yes Moisture Temperature / Pain No Abnormalities Noted: No Temperature: No Abnormality Dry / Scaly: No Maceration: No Treatment Notes Wound #3 (Gluteus) Wound Laterality: Left Cleanser Vashe 5.8 (oz) Discharge Instruction: Cleanse the wound with Vashe prior to applying Mark clean dressing using gauze sponges, not tissue or cotton balls. Peri-Wound Care Topical Primary Dressing Dakin's Solution 0.25%, 16 (oz) Discharge Instruction: Moisten gauze with Dakin's solution Secondary Dressing Zetuvit Plus Silicone Border Dressing 7x7(in/in) Discharge Instruction: Apply silicone border over primary dressing as directed. Secured With Compression Wrap Compression Stockings Facilities manager) Signed: 06/03/2023 5:02:38 PM By: Antonieta Iba, Kaelyn [name] ([id number]) PM By: Thayer Dallas [id number].pdf Page 9 of 13 Signed: 06/03/2023 5:02:38 Entered By: Thayer Dallas on 05/31/2023 10:18:58 -------------------------------------------------------------------------------- Wound Assessment Details Patient Name: Date of Service: [name]. 05/31/2023 12:30 PM Medical Record Number:  [id number] Patient Account Number: [id number] Date of Birth/Sex: Treating RN: Jun 01, 1961 (62 y.o. M) Primary Care Zynasia Burklow: [facility] Other Clinician: Referring Reese Stockman: Treating Jedrick Hutcherson/Extender: [name] in Treatment: 1 Wound Status Wound Number: 4 Primary Etiology: Pressure Ulcer Wound Location: Right, Posterior Gluteus Wound Status: Open Wounding Event: Pressure Injury Comorbid History: Asthma Date Acquired: 05/14/2022 Weeks Of Treatment: 1 Clustered Wound: No Photos Wound Measurements Length: (cm) 5.5 Width: (cm) 9 Depth: (cm) 3 Area: (cm) 38.877 Volume: (cm) 116.632 % Reduction in Area: -67.7% % Reduction in Volume: -43.7% Tunneling: No Undermining: Yes Starting Position (o'clock): 9 Ending Position (o'clock): 1 Maximum Distance: (cm) 5.9 Wound Description Classification: Category/Stage IV Wound Margin: Distinct, outline attached Exudate Amount: Medium Exudate Type: Serous Exudate Color: amber Foul  Odor After Cleansing: No Slough/Fibrino No Wound Bed Granulation Amount: Medium (34-66%) Exposed Structure Granulation Quality: Red, Pink, Friable Fascia Exposed: No Necrotic Amount: Small (1-33%) Fat Layer (Subcutaneous Tissue) Exposed: Yes Necrotic Quality: Adherent Slough Tendon Exposed: No Muscle Exposed: Yes Necrosis of Muscle: Yes Joint Exposed: No Bone Exposed: Yes Periwound Skin Texture Texture Color No Abnormalities Noted: Yes No Abnormalities Noted: Yes Moisture No Abnormalities NotedAllenmichael [name], [name] ([id number]) [id number] Page 10 of 13 Treatment Notes Wound #4 (Gluteus) Wound Laterality: Right, Posterior Cleanser Vashe 5.8 (oz) Discharge Instruction: Cleanse the wound with Vashe prior to applying Mark clean dressing using gauze sponges, not tissue or cotton balls. Peri-Wound Care Topical Primary Dressing Dakin's Solution 0.25%, 16 (oz) Discharge Instruction: Moisten gauze with Dakin's  solution Secondary Dressing Zetuvit Plus Silicone Border Dressing 7x7(in/in) Discharge Instruction: Apply silicone border over primary dressing as directed. Secured With Compression Wrap Compression Stockings Facilities manager) Signed: 06/03/2023 5:02:38 PM By: Thayer Dallas Entered By: Thayer Dallas on 05/31/2023 10:17:02 -------------------------------------------------------------------------------- Wound Assessment Details Patient Name: Date of Service: [name], [name]. 05/31/2023 12:30 PM Medical Record Number: [id number] Patient Account Number: [id number] Date of Birth/Sex: Treating RN: [birth year] (62 y.o. M) Primary Care Balin Vandegrift: [facility] Other Clinician: Referring Draxton Luu: Treating Chanta Bauers/Extender: [name] in Treatment: 1 Wound Status Wound Number: 5 Primary Etiology: Pressure Ulcer Wound Location: Distal, Midline Back Wound Status: Open Wounding Event: Pressure Injury Comorbid History: Asthma Date Acquired: 05/13/2023 Weeks Of Treatment: 0 Clustered Wound: No Photos Wound Measurements Length: (cm) 0.5 Width: (cm) 0.3 Depth: (cm) 0.1 [name], [name] ([id number]) Area: (cm) 0.118 Volume: (cm) 0.012 % Reduction in Area: % Reduction in Volume: Epithelialization: None [id number] Page 11 of 13 Tunneling: No Undermining: No Wound Description Classification: Category/Stage II Exudate Amount: Medium Exudate Type: Serosanguineous Exudate Color: red, brown Foul Odor After Cleansing: No Slough/Fibrino No Wound Bed Granulation Amount: Medium (34-66%) Granulation Quality: Red Necrotic Amount: Medium (34-66%) Necrotic Quality: Adherent Slough Periwound Skin Texture Texture Color No Abnormalities Noted: No No Abnormalities Noted: No Callus: No Atrophie Blanche: No Crepitus: No Cyanosis: No Excoriation: No Ecchymosis: No Induration: No Erythema: No Rash: No Hemosiderin Staining:  No Scarring: No Mottled: No Pallor: No Moisture Rubor: No No Abnormalities Noted: No Dry / Scaly: No Maceration: No Treatment Notes Wound #5 (Back) Wound Laterality: Midline, Distal Cleanser Vashe 5.8 (oz) Discharge Instruction: Cleanse the wound with Vashe prior to applying Mark clean dressing using gauze sponges, not tissue or cotton balls. Peri-Wound Care Skin Prep Discharge Instruction: Use skin prep as directed Topical Santyl Collagenase Ointment, 30 (gm), tube Primary Dressing Hydrofera Blue Ready Transfer Foam, 4x5 (in/in) Discharge Instruction: Apply to wound bed as instructed Secondary Dressing Zetuvit Plus Silicone Border Dressing 4x4 (in/in) Discharge Instruction: Apply silicone border over primary dressing as directed. Secured With Compression Wrap Compression Stockings Facilities manager) Signed: 06/03/2023 5:02:38 PM By: Thayer Dallas Entered By: Thayer Dallas on 05/31/2023 10:19:21 -------------------------------------------------------------------------------- Wound Assessment Details Patient Name: Date of Service: [name], [name]. 05/31/2023 12:30 PM [name], Welcome [name] ([id number]) [id number] Page 12 of 13 Medical Record Number: [id number] Patient Account Number: [id number] Date of Birth/Sex: Treating RN: 1961-07-01 (62 y.o. M) Primary Care Marjean Imperato: [facility] Other Clinician: Referring Khrystyne Arpin: Treating Caidence Higashi/Extender: [name] in Treatment: 1 Wound Status Wound Number: 6 Primary Etiology: Pressure Ulcer Wound Location: Right, Lateral Gluteus Wound Status: Open Wounding Event: Pressure Injury Comorbid History: Asthma Date Acquired: 05/13/2023 Weeks Of Treatment: 0 Clustered Wound:  No Photos Wound Measurements Length: (cm) 1.5 Width: (cm) 3.2 Depth: (cm) 0.1 Area: (cm) 3.77 Volume: (cm) 0.377 % Reduction in Area: % Reduction in Volume: Epithelialization: None Tunneling:  No Undermining: No Wound Description Classification: Category/Stage II Wound Margin: Distinct, outline attached Exudate Amount: Medium Exudate Type: Serosanguineous Exudate Color: red, brown Foul Odor After Cleansing: No Slough/Fibrino No Wound Bed Granulation Amount: Medium (34-66%) Exposed Structure Granulation Quality: Pink Fascia Exposed: No Necrotic Amount: Medium (34-66%) Fat Layer (Subcutaneous Tissue) Exposed: Yes Necrotic Quality: Adherent Slough Tendon Exposed: No Muscle Exposed: No Joint Exposed: No Bone Exposed: No Periwound Skin Texture Texture Color No Abnormalities Noted: No No Abnormalities Noted: No Callus: No Atrophie Blanche: No Crepitus: No Cyanosis: No Excoriation: No Ecchymosis: No Induration: No Erythema: No Rash: No Hemosiderin Staining: No Scarring: No Mottled: No Pallor: No Moisture Rubor: No No Abnormalities Noted: No Dry / Scaly: No Maceration: No Treatment Notes Wound #6 (Gluteus) Wound Laterality: Right, Lateral Cleanser Vashe 5.8 (oz) Discharge Instruction: Cleanse the wound with Vashe prior to applying Mark clean dressing using gauze sponges, not tissue or cotton balls. [name] ([id number]) [id number] Page 13 of 13 Peri-Wound Care Skin Prep Discharge Instruction: Use skin prep as directed Topical Santyl Collagenase Ointment, 30 (gm), tube Primary Dressing Hydrofera Blue Ready Transfer Foam, 4x5 (in/in) Discharge Instruction: Apply to wound bed as instructed Secondary Dressing Zetuvit Plus Silicone Border Dressing 4x4 (in/in) Discharge Instruction: Apply silicone border over primary dressing as directed. Secured With Compression Wrap Compression Stockings Facilities manager) Signed: 06/03/2023 5:02:38 PM By: Thayer Dallas Entered By: Thayer Dallas on 05/31/2023 10:20:11 -------------------------------------------------------------------------------- Vitals Details Patient  Name: Date of Service: [name], [name]. 05/31/2023 12:30 PM Medical Record Number: [id number] Patient Account Number: [id number] Date of Birth/Sex: Treating RN: 1961/06/22 (62 y.o. M) Primary Care Cincere Deprey: Clinic, [facility]per Other Clinician: Referring Christianjames Soule: Treating Gaspar Fowle/Extender: [name] in Treatment: 1 Vital Signs Time Taken: 12:56 Temperature ([name]): 98.6 Pulse (bpm): 61 Respiratory Rate (breaths/min): 18 Blood Pressure (mmHg): 100/66 Reference Range: 80 - 120 mg / dl Electronic Signature(s) Signed: 06/03/2023 5:02:38 PM By: Thayer Dallas Entered By: Thayer Dallas on 05/31/2023 09:56:46

## 2023-06-07 ENCOUNTER — Other Ambulatory Visit: Payer: Self-pay

## 2023-06-07 ENCOUNTER — Encounter: Payer: Self-pay | Admitting: Neurology

## 2023-06-07 MED ORDER — LAMOTRIGINE 100 MG PO TABS: 100.0000 mg | ORAL_TABLET | Freq: Four times a day (QID) | ORAL | 1 refills | Status: DC

## 2023-06-08 ENCOUNTER — Telehealth: Payer: Self-pay

## 2023-06-08 MED ORDER — LAMOTRIGINE 100 MG PO TABS: 100.0000 mg | ORAL_TABLET | Freq: Four times a day (QID) | ORAL | 1 refills | Status: DC

## 2023-06-08 NOTE — Progress Notes (Signed)
[name] ([id number]) [id number] Page 1 of 4 Visit Report for 05/18/2023 Abuse Risk Screen Details Patient Name: Date of Service: Mark Mirza [name]. 05/18/2023 8:00 [name] Medical Record Number: [id number] Patient Account Number: [id number] Date of Birth/Sex: Treating RN: [birth year] (62 y.o. [name] Primary Care [name]: Clinic, [name] Other Clinician: Referring [name]: Treating [name]/Extender: [name] in Treatment: 0 Abuse Risk Screen Items Answer ABUSE RISK SCREEN: Has anyone close to you tried to hurt or harm you recentlyo No Do you feel uncomfortable with anyone in your familyo No Has anyone forced you do things that you didnt want to doo No Electronic Signature(s) Signed: 06/08/2023 2:03:55 PM By: [name] Entered By: [name] on 05/18/2023 08:20:36 -------------------------------------------------------------------------------- Activities of Daily Living Details Patient Name: Date of Service: Mark Mirza [name]. 05/18/2023 8:00 [name] Medical Record Number: [id number] Patient Account Number: [id number] Date of Birth/Sex: Treating RN: 11/13/1960 (62 y.o. [name] Primary Care Kendi Defalco: Clinic, [name] Other Clinician: Referring Bridgid Printz: Treating Bryona Foxworthy/Extender: [name] in Treatment: 0 Activities of Daily Living Items Answer Activities of Daily Living (Please select one for each item) Drive Automobile Need Assistance T Medications ake Need Assistance Use T elephone Need Assistance Care for Appearance Need Assistance Use T oilet Need Assistance Bath / Shower Need Assistance Dress Self Need Assistance Feed Self Need Assistance Walk Need Assistance Get In / Out Bed Need Assistance Housework Need Assistance Prepare Meals Need Assistance Handle Money Need Assistance Shop for Self Need Assistance Electronic Signature(s) Signed: 06/08/2023 2:03:55 PM By:  [name] Entered By: [name] on 05/18/2023 08:21:05 [name] ([id number]) [id number] Page 2 of 4 -------------------------------------------------------------------------------- Education Screening Details Patient Name: Date of Service: Mark Mirza [name]. 05/18/2023 8:00 [name] Medical Record Number: [id number] Patient Account Number: [id number] Date of Birth/Sex: Treating RN: September 19, 1961 (62 y.o. [name] Primary Care Armelia Penton: Clinic, [name] Other Clinician: Referring Greenly Rarick: Treating Shanikka Wonders/Extender: [name] in Treatment: 0 Learning Preferences/Education Level/Primary Language Highest Education Level: College or Above Preferred Language: English Cognitive Barrier Language Barrier: No Translator Needed: No Memory Deficit: No Emotional Barrier: No Cultural/Religious Beliefs Affecting Medical Care: No Physical Barrier Impaired Vision: Yes Glasses Impaired Hearing: No Decreased Hand dexterity: No Knowledge/Comprehension Knowledge Level: High Comprehension Level: High Ability to understand written instructions: High Ability to understand verbal instructions: High Motivation Anxiety Level: Calm Cooperation: Cooperative Education Importance: Acknowledges Need Interest in Health Problems: Asks Questions Perception: Coherent Willingness to Engage in Self-Management High Activities: Readiness to Engage in Self-Management High Activities: Electronic Signature(s) Signed: 06/08/2023 2:03:55 PM By: [name] Entered By: [name] on 05/18/2023 08:22:11 -------------------------------------------------------------------------------- Fall Risk Assessment Details Patient Name: Date of Service: [name] DA [name] [name]. 05/18/2023 8:00 [name] Medical Record Number: [id number] Patient Account Number: [id number] Date of Birth/Sex: Treating RN: 01-05-1961 (62 y.o. [name] Primary Care  Kamoni Depree: Clinic, [name] Other Clinician: Referring Katelyne Galster: Treating Kynnadi Dicenso/Extender: [name] in Treatment: 0 Fall Risk Assessment Items Have you had 2 or more falls in the last 12 monthso 0 No Have you had any fall that resulted in injury in the last 12 monthso 0 No FALLS RISK SCREEN [name] ([id number]) [phone] [id number] Page 3 of 4 History of falling - immediate or within 3 months 0 No Secondary diagnosis (Do you have 2 or more medical diagnoseso) 0 No Ambulatory aid None/bed rest/wheelchair/nurse 0 No Crutches/cane/walker 0 No  Furniture 0 No Intravenous therapy Access/Saline/Heparin Lock 0 No Gait/Transferring Normal/ bed rest/ wheelchair 0 No Weak (short steps with or without shuffle, stooped but able to lift head while walking, may seek 0 No support from furniture) Impaired (short steps with shuffle, may have difficulty arising from chair, head down, impaired 0 No balance) Mental Status Oriented to own ability 0 No Electronic Signature(s) Signed: 06/08/2023 2:03:55 PM By: [name] Entered By: [name] on 05/18/2023 08:22:20 -------------------------------------------------------------------------------- Foot Assessment Details Patient Name: Date of Service: [name] DA [name] [name]. 05/18/2023 8:00 [name] Medical Record Number: [id number] Patient Account Number: [id number] Date of Birth/Sex: Treating RN: 1961/05/28 (62 y.o. [name] Primary Care Jorian Willhoite: Clinic, [name] Other Clinician: Referring Bertrand Vowels: Treating Mellonie Guess/Extender: [name] in Treatment: 0 Foot Assessment Items Site Locations + = Sensation present, - = Sensation absent, C = Callus, U = Ulcer R = Redness, W = Warmth, [name] = Maceration, PU = Pre-ulcerative lesion [name] = Fissure, S = Swelling, D = Dryness Assessment Right: Left: Other Deformity: No No Prior Foot Ulcer: No No Prior Amputation: No No Charcot Joint:  No No Ambulatory Status: Non-ambulatory Assistance Device: Wheelchair Gait: [name] [name] ([id number]) [id number] Page 4 of 4 Electronic Signature(s) Signed: 06/08/2023 2:03:55 PM By: [name] Entered By: [name] on 05/18/2023 08:25:35 -------------------------------------------------------------------------------- Nutrition Risk Screening Details Patient Name: Date of Service: Mark Mirza [name]. 05/18/2023 8:00 [name] Medical Record Number: [id number] Patient Account Number: [id number] Date of Birth/Sex: Treating RN: [birth year] (62 y.o. [name] Primary Care Linzie Boursiquot: Clinic, [name] Other Clinician: Referring Julitza Rickles: Treating Zaley Talley/Extender: [name] in Treatment: 0 Height (in): Weight (lbs): Body Mass Index (BMI): Nutrition Risk Screening Items Score Screening NUTRITION RISK SCREEN: I have an illness or condition that made me change the kind and/or amount of food I eat 0 No I eat fewer than two meals per day 0 No I eat few fruits and vegetables, or milk products 0 No I have three or more drinks of beer, liquor or wine almost every day 0 No I have tooth or mouth problems that make it hard for me to eat 0 No I don't always have enough money to buy the food I need 0 No I eat alone most of the time 0 No I take three or more different prescribed or over-the-counter drugs Mark day 0 No Without wanting to, I have lost or gained 10 pounds in the last six months 0 No I am not always physically able to shop, cook and/or feed myself 0 No Nutrition Protocols Good Risk Protocol Moderate Risk Protocol High Risk Proctocol Risk Level: Good Risk Score: 0 Electronic Signature(s) Signed: 06/08/2023 2:03:55 PM By: [name] Entered By: [name] on 05/18/2023 08:22:30

## 2023-06-08 NOTE — Progress Notes (Signed)
[name] ([id number]) [id number] Page 1 of 11 Visit Report for 05/18/2023 Allergy List Details Patient Name: Date of Service: [name]. 05/18/2023 8:00 Mark M Medical Record Number: [id number] Patient Account Number: [id number] Date of Birth/Sex: Treating RN: Nov 06, 1960 (62 y.o. [name] Primary Care Everson Sanjuan: [facility] Other Clinician: Referring Amanat Hackel: Treating Taedyn Glasscock/Extender: [name] in Treatment: 0 Allergies Active Allergies gadopiclenol Allergy Notes Electronic Signature(s) Signed: 06/08/2023 2:03:55 PM By: Brenton Grills Entered By: Brenton Grills on 05/18/2023 08:18:57 -------------------------------------------------------------------------------- Arrival Information Details Patient Name: Date of Service: [name], [name]. 05/18/2023 8:00 Mark M Medical Record Number: [id number] Patient Account Number: [id number] Date of Birth/Sex: Treating RN: [birth year] (62 y.o. [name] Primary Care Chance Munter: [facility] Other Clinician: Referring Gracelin Weisberg: Treating Cana Mignano/Extender: [name] in Treatment: 0 Visit Information Patient Arrived: Wheel Chair Arrival Time: 08:16 Accompanied By: wife Transfer Assistance: Nurse, adult Patient Identification Verified: Yes Secondary Verification Process Completed: Yes Patient Requires Transmission-Based Precautions: No Patient Has Alerts: No Electronic Signature(s) Signed: 06/08/2023 2:03:55 PM By: Brenton Grills Entered By: Brenton Grills on 05/18/2023 08:16:49 -------------------------------------------------------------------------------- Clinic Level of Care Assessment Details Patient Name: Date of Service: [name], [name]. 05/18/2023 8:00 Mark M Medical Record Number: [id number] Patient Account Number: [id number] CRUIZE, SHAULIS ([id number]) [id number].pdf Page 2 of 11 Date of Birth/Sex: Treating  RN: Dec 13, 1960 (62 y.o. [name] Primary Care Ethelmae Ringel: [facility] Other Clinician: Referring Kostantinos Tallman: Treating Yakima Kreitzer/Extender: [name] in Treatment: 0 Clinic Level of Care Assessment Items TOOL 2 Quantity Score X- 1 0 Use when only an EandM is performed on the INITIAL visit ASSESSMENTS - Nursing Assessment / Reassessment X- 1 20 General Physical Exam (combine w/ comprehensive assessment (listed just below) when performed on new pt. evals) X- 1 25 Comprehensive Assessment (HX, ROS, Risk Assessments, Wounds Hx, etc.) ASSESSMENTS - Wound and Skin Mark ssessment / Reassessment X - Simple Wound Assessment / Reassessment - one wound 1 5 X- 1 5 Complex Wound Assessment / Reassessment - multiple wounds X- 1 10 Dermatologic / Skin Assessment (not related to wound area) ASSESSMENTS - Ostomy and/or Continence Assessment and Care X- 1 10 Incontinence Assessment and Management []  - 0 Ostomy Care Assessment and Management (repouching, etc.) PROCESS - Coordination of Care []  - 0 Simple Patient / Family Education for ongoing care X- 1 20 Complex (extensive) Patient / Family Education for ongoing care X- 1 10 Staff obtains Chiropractor, Records, T Results / Process Orders est []  - 0 Staff telephones HHA, Nursing Homes / Clarify orders / etc []  - 0 Routine Transfer to another Facility (non-emergent condition) []  - 0 Routine Hospital Admission (non-emergent condition) []  - 0 New Admissions / Manufacturing engineer / Ordering NPWT Apligraf, etc. , []  - 0 Emergency Hospital Admission (emergent condition) []  - 0 Simple Discharge Coordination X- 1 15 Complex (extensive) Discharge Coordination PROCESS - Special Needs []  - 0 Pediatric / Minor Patient Management []  - 0 Isolation Patient Management []  - 0 Hearing / Language / Visual special needs []  - 0 Assessment of Community assistance (transportation, D/C planning, etc.) []  - 0 Additional assistance  / Altered mentation X- 1 15 Support Surface(s) Assessment (bed, cushion, seat, etc.) INTERVENTIONS - Wound Cleansing / Measurement X- 1 5 Wound Imaging (photographs - any number of wounds) []  - 0 Wound Tracing (instead of photographs) []  - 0 Simple Wound Measurement - one wound X- 1 5 Complex Wound Measurement -  multiple wounds []  - 0 Simple Wound Cleansing - one wound X- 1 5 Complex Wound Cleansing - multiple wounds INTERVENTIONS - Wound Dressings []  - 0 Small Wound Dressing one or multiple wounds X- 1 15 Medium Wound Dressing one or multiple wounds X- 2 20 Large Wound Dressing one or multiple wounds []  - 0 Application of Medications - injection [name], [name] ([id number]) [id number] Page 3 of 11 INTERVENTIONS - Miscellaneous []  - 0 External ear exam []  - 0 Specimen Collection (cultures, biopsies, blood, body fluids, etc.) []  - 0 Specimen(s) / Culture(s) sent or taken to Lab for analysis []  - 0 Patient Transfer (multiple staff / Michiel Sites Lift / Similar devices) []  - 0 Simple Staple / Suture removal (25 or less) []  - 0 Complex Staple / Suture removal (26 or more) []  - 0 Hypo / Hyperglycemic Management (close monitor of Blood Glucose) X- 1 15 Ankle / Brachial Index (ABI) - do not check if billed separately Has the patient been seen at the hospital within the last three years: Yes Total Score: 220 Level Of Care: New/Established - Level 5 Electronic Signature(s) Signed: 06/08/2023 2:03:55 PM By: Brenton Grills Entered By: Brenton Grills on 05/18/2023 10:07:44 -------------------------------------------------------------------------------- Encounter Discharge Information Details Patient Name: Date of Service: [name], [name]. 05/18/2023 8:00 Mark M Medical Record Number: [id number] Patient Account Number: [id number] Date of Birth/Sex: Treating RN: [birth year] (62 y.o. [name] Primary Care Philemon Riedesel: [facility] Other  Clinician: Referring Jannelle Notaro: Treating Ean Gettel/Extender: [name] in Treatment: 0 Encounter Discharge Information Items Discharge Condition: Stable Ambulatory Status: Wheelchair Discharge Destination: Home Transportation: Private Auto Accompanied By: wife Schedule Follow-up Appointment: Yes Clinical Summary of Care: Patient Declined Electronic Signature(s) Signed: 06/08/2023 2:03:55 PM By: Brenton Grills Entered By: Brenton Grills on 05/18/2023 10:08:47 -------------------------------------------------------------------------------- Lower Extremity Assessment Details Patient Name: Date of Service: [name], [name]. 05/18/2023 8:00 Mark M Medical Record Number: [id number] Patient Account Number: [id number] Date of Birth/Sex: Treating RN: [birth year] (62 y.o. [name] Primary Care Pierrette Scheu: [facility] Other Clinician: Referring Allisyn Kunz: Treating Delwin Raczkowski/Extender: [name] in Treatment: 0 Edema Assessment Assessed: [Left: No] [Right: No] [Left: Edema] Franne Forts: :] V[LeftGreig Right, Zayon [name] ([id number])] [Right: [id number] Page 4 of 11] Calf Left: Right: Point of Measurement: From Medial Instep 35 cm 35 cm Ankle Left: Right: Point of Measurement: From Medial Instep 29 cm 25 cm Vascular Assessment Pulses: Dorsalis Pedis Palpable: [Left:Yes] [Right:Yes] Extremity colors, hair growth, and conditions: Extremity Color: [Left:Normal] [Right:Normal] Hair Growth on Extremity: [Left:No] [Right:No] Temperature of Extremity: [Left:Warm] [Right:Warm] Capillary Refill: [Left:< 3 seconds] [Right:< 3 seconds] Dependent Rubor: [Left:No] [Right:No] Blanched when Elevated: [Left:No] [Right:No] Lipodermatosclerosis: [Left:No] [Right:No] Blood Pressure: Brachial: [Left:105] [Right:105] Ankle: [Left:Dorsalis Pedis: 110 1.05] [Right:Dorsalis Pedis: 108 1.03] Toe Nail Assessment Left: Right: Thick: Yes Yes Discolored: No  No Deformed: No No Improper Length and Hygiene: No No Electronic Signature(s) Signed: 06/08/2023 2:03:55 PM By: Brenton Grills Entered By: Brenton Grills on 05/18/2023 08:37:12 -------------------------------------------------------------------------------- Multi Wound Chart Details Patient Name: Date of Service: [name], [name]. 05/18/2023 8:00 Mark M Medical Record Number: [id number] Patient Account Number: [id number] Date of Birth/Sex: Treating RN: Mar 20, 1961 (62 y.o. M) Primary Care Hendrix Yurkovich: [facility] Other Clinician: Referring Aysa Larivee: Treating Liboria Putnam/Extender: [name] in Treatment: 0 Vital Signs Height(in): Pulse(bpm): 61 Weight(lbs): Blood Pressure(mmHg): 105/70 Body Mass Index(BMI): Temperature([name]): 98 Respiratory Rate(breaths/min): 18 [2:Photos:] [name], [name] ([id number]) [2:Proximal, Medial, Posterior Back Wound Location: Gradually Appeared Wounding  Event: Pressure Ulcer Primary Etiology: Asthma Comorbid History: 05/14/2022 Date Acquired: 0 Weeks of Treatment: Open Wound Status: No Wound Recurrence: 2.5x1.1x0.2  Measurements L x W x D (cm) 2.16 Mark (cm) : rea 0.432 Volume (cm) : Position 1 (o'clock): Maximum Distance 1 (cm): No Tunneling: Category/Stage III Classification: Medium Exudate Mark mount: Serosanguineous Exudate Type: red, brown Exudate Color: Distinct,  outline attached Wound Margin: Medium (34-66%) Granulation Mark mount: Red, Pink Granulation Quality: Medium (34-66%) Necrotic Mark mount: Fat Layer (Subcutaneous Tissue): Yes Fat Layer (Subcutaneous Tissue): Yes Fat Layer (Subcutaneous Tissue): Yes Exposed  Structures: Scarring: Yes Periwound Skin Texture: Maceration: No Periwound Skin Moisture: Dry/Scaly: No Hemosiderin Staining: Yes Periwound Skin Color: No Abnormality Temperature:] [3:Left Gluteus Gradually Appeared Pressure Ulcer Asthma 05/14/2022 0 Open  No 5x3.3x3.5 12.959 45.357 10 3.5 Yes Category/Stage IV Medium Serosanguineous red, brown  Distinct, outline attached Medium (34-66%) Red, Pink Medium (34-66%) Scarring: Yes Maceration: No Dry/Scaly: No Hemosiderin Staining: Yes No Abnormality]  [4:[id number] Page 5 of 11 Right, Posterior Gluteus Pressure Injury Pressure Ulcer Asthma 05/14/2022 0 Open No 8.2x3.6x3.5 23.185 81.147 11 5.1 Yes Category/Stage IV Medium Serous amber N/Mark Medium (34-66%) Red, Pink Medium  (34-66%) No Abnormalities Noted No Abnormalities Noted No Abnormalities Noted N/Mark] Treatment Notes Wound #2 (Back) Wound Laterality: Medial, Posterior, Proximal Cleanser Peri-Wound Care Topical Santyl Collagenase Ointment, 30 (gm), tube Primary Dressing Hydrofera Blue Ready Transfer Foam, 4x5 (in/in) Discharge Instruction: Apply to wound bed as instructed Secondary Dressing Secured With Compression Wrap Compression Stockings Add-Ons Wound #3 (Gluteus) Wound Laterality: Left Cleanser Vashe 5.8 (oz) Discharge Instruction: Cleanse the wound with Vashe prior to applying Mark clean dressing using gauze sponges, not tissue or cotton balls. Dakins Discharge Instruction: wet to dry packing wound Peri-Wound Care Topical Primary Dressing Dakins Discharge Instruction: wet to dry pack wound Secondary Dressing Woven Gauze Sponge, Non-Sterile 4x4 in Discharge Instruction: Apply over primary dressing as directed. Secured With [employer], 4.5x3.1 (in/yd) Discharge Instruction: Secure with Kerlix as directed. Compression [name], [name] ([id number]) [id number] Page 6 of 11 Compression Stockings Add-Ons Wound #4 (Gluteus) Wound Laterality: Right, Posterior Cleanser Vashe 5.8 (oz) Discharge Instruction: Cleanse the wound with Vashe prior to applying Mark clean dressing using gauze sponges, not tissue or cotton balls. Dakins Discharge Instruction: wet to dry packing wound Peri-Wound Care Topical Primary Dressing Dakins Discharge Instruction: wet to dry pack  wound Secondary Dressing Woven Gauze Sponge, Non-Sterile 4x4 in Discharge Instruction: Apply over primary dressing as directed. Secured With [employer], 4.5x3.1 (in/yd) Discharge Instruction: Secure with Kerlix as directed. Compression Wrap Compression Stockings Add-Ons Electronic Signature(s) Signed: 05/18/2023 5:13:05 PM By: Geralyn Corwin DO Entered By: Geralyn Corwin on 05/18/2023 16:51:21 -------------------------------------------------------------------------------- Multi-Disciplinary Care Plan Details Patient Name: Date of Service: [name], [name]. 05/18/2023 8:00 Mark M Medical Record Number: [id number] Patient Account Number: [id number] Date of Birth/Sex: Treating RN: [birth year] (62 y.o. [name] Primary Care Jassiel Flye: [facility] Other Clinician: Referring Keionte Swicegood: Treating Arienna Benegas/Extender: [name] in Treatment: 0 Active Inactive Wound/Skin Impairment Nursing Diagnoses: Knowledge deficit related to smoking impact on wound healing Goals: Patient/caregiver will verbalize understanding of skin care regimen Date Initiated: 05/18/2023 Target Resolution Date: 08/12/2023 Goal Status: Active Interventions: Assess patient/caregiver ability to obtain necessary supplies Assess patient/caregiver ability to perform ulcer/skin care regimen upon admission and as needed Assess ulceration(s) every visit Provide education on ulcer and skin care [place], Kellen [name] ([id number]) [id number] Page 7 of 11 Screen for HBO Treatment Activities: Skin care  regimen initiated : 05/18/2023 Topical wound management initiated : 05/18/2023 Notes: Electronic Signature(s) Signed: 06/08/2023 2:03:55 PM By: Brenton Grills Entered By: Brenton Grills on 05/18/2023 64:33:29 -------------------------------------------------------------------------------- Pain Assessment Details Patient Name: Date of Service: [name], [name].  05/18/2023 8:00 Mark M Medical Record Number: [id number] Patient Account Number: [id number] Date of Birth/Sex: Treating RN: [birth year] (62 y.o. [name] Primary Care Yifan Auker: [facility] Other Clinician: Referring Ulises Wolfinger: Treating Yarel Kilcrease/Extender: [name] in Treatment: 0 Active Problems Location of Pain Severity and Description of Pain Patient Has Paino No Site Locations Pain Management and Medication Current Pain Management: Electronic Signature(s) Signed: 06/08/2023 2:03:55 PM By: Brenton Grills Entered By: Brenton Grills on 05/18/2023 09:22:11 -------------------------------------------------------------------------------- Patient/Caregiver Education Details Patient Name: Date of Service: [name] 8/6/2024andnbsp8:00 Mark M Medical Record Number: [id number] Patient Account Number: [id number] Date of Birth/Gender: Treating RN: 02/03/1961 (62 y.o. [name] Primary Care Physician: [facility] Other Clinician: Referring Physician: Treating Physician/Extender: [name], [name] ([id number]) [id number] Page 8 of 11 Weeks in Treatment: 0 Education Assessment Education Provided To: Patient and Caregiver Education Topics Provided Wound/Skin Impairment: Methods: Explain/Verbal Responses: State content correctly Electronic Signature(s) Signed: 06/08/2023 2:03:55 PM By: Brenton Grills Entered By: Brenton Grills on 05/18/2023 09:08:37 -------------------------------------------------------------------------------- Wound Assessment Details Patient Name: Date of Service: [name], [name]. 05/18/2023 8:00 Mark M Medical Record Number: [id number] Patient Account Number: [id number] Date of Birth/Sex: Treating RN: [birth year] (62 y.o. [name] Primary Care Roderic Lammert: [facility] Other Clinician: Referring Rayola Everhart: Treating Donica Derouin/Extender: [name] in  Treatment: 0 Wound Status Wound Number: 2 Primary Etiology: Pressure Ulcer Wound Location: Proximal, Medial, Posterior Back Wound Status: Open Wounding Event: Gradually Appeared Comorbid History: Asthma Date Acquired: 05/14/2022 Weeks Of Treatment: 0 Clustered Wound: No Photos Wound Measurements Length: (cm) 2.5 Width: (cm) 1.1 Depth: (cm) 0.2 Area: (cm) 2.16 Volume: (cm) 0.432 % Reduction in Area: % Reduction in Volume: Tunneling: No Wound Description Classification: Category/Stage III Wound Margin: Distinct, outline attached Exudate Amount: Medium Exudate Type: Serosanguineous Exudate Color: red, brown Foul Odor After Cleansing: No Slough/Fibrino No Wound Bed [name], [name] ([id number]) [id number] Page 9 of 11 Granulation Amount: Medium (34-66%) Exposed Structure Granulation Quality: Red, Pink Fat Layer (Subcutaneous Tissue) Exposed: Yes Necrotic Amount: Medium (34-66%) Necrotic Quality: Adherent Slough Periwound Skin Texture Texture Color No Abnormalities Noted: No No Abnormalities Noted: No Scarring: Yes Hemosiderin Staining: Yes Moisture Temperature / Pain No Abnormalities Noted: No Temperature: No Abnormality Dry / Scaly: No Maceration: No Electronic Signature(s) Signed: 05/18/2023 5:13:05 PM By: Geralyn Corwin DO Signed: 06/08/2023 2:03:55 PM By: Brenton Grills Entered By: Geralyn Corwin on 05/18/2023 13:45:54 -------------------------------------------------------------------------------- Wound Assessment Details Patient Name: Date of Service: [name], [name]. 05/18/2023 8:00 Mark M Medical Record Number: [id number] Patient Account Number: [id number] Date of Birth/Sex: Treating RN: 02-22-1961 (62 y.o. [name] Primary Care Florette Thai: [facility] Other Clinician: Referring Atina Feeley: Treating Ephraim Reichel/Extender: [name] in Treatment: 0 Wound Status Wound Number: 3 Primary Etiology: Pressure  Ulcer Wound Location: Left Gluteus Wound Status: Open Wounding Event: Gradually Appeared Comorbid History: Asthma Date Acquired: 05/14/2022 Weeks Of Treatment: 0 Clustered Wound: No Photos Wound Measurements Length: (cm) 5 Width: (cm) 3.3 Depth: (cm) 3.5 Area: (cm) 12.959 Volume: (cm) 45.357 % Reduction in Area: % Reduction in Volume: Tunneling: Yes Position (o'clock): 10 Maximum Distance: (cm) 3.5 Wound Description Classification: Category/Stage IV Wound Margin: Distinct, outline attached Exudate Amount:  Medium Exudate Type: Serosanguineous Exudate Color: red, brown [name], [name] ([id number]) Wound Bed Granulation Amount: Medium (34-66%) Granulation Quality: Red, Pink Necrotic Amount: Medium (34-66%) Necrotic Quality: Adherent Slough Foul Odor After Cleansing: No Slough/Fibrino No [id number].pdf Page 10 of 11 Exposed Structure Fat Layer (Subcutaneous Tissue) Exposed: Yes Periwound Skin Texture Texture Color No Abnormalities Noted: No No Abnormalities Noted: No Scarring: Yes Hemosiderin Staining: Yes Moisture Temperature / Pain No Abnormalities Noted: No Temperature: No Abnormality Dry / Scaly: No Maceration: No Electronic Signature(s) Signed: 06/08/2023 2:03:55 PM By: Brenton Grills Entered By: Brenton Grills on 05/18/2023 09:21:53 -------------------------------------------------------------------------------- Wound Assessment Details Patient Name: Date of Service: [name], [name]. 05/18/2023 8:00 Mark M Medical Record Number: [id number] Patient Account Number: [id number] Date of Birth/Sex: Treating RN: [birth year] (62 y.o. [name] Primary Care Shelvia Fojtik: [facility] Other Clinician: Referring Samiksha Pellicano: Treating Colandra Ohanian/Extender: [name] in Treatment: 0 Wound Status Wound Number: 4 Primary Etiology: Pressure Ulcer Wound Location: Right, Posterior Gluteus Wound Status: Open Wounding Event:  Pressure Injury Comorbid History: Asthma Date Acquired: 05/14/2022 Weeks Of Treatment: 0 Clustered Wound: No Photos Wound Measurements Length: (cm) 8.2 Width: (cm) 3.6 Depth: (cm) 3.5 Area: (cm) 23.185 Volume: (cm) 81.147 % Reduction in Area: % Reduction in Volume: Tunneling: Yes Position (o'clock): 11 Maximum Distance: (cm) 5.1 Undermining: No Wound Description Classification: Category/Stage IV Exudate Amount: Medium Exudate Type: Serous Exudate Color: amber [name], [name] ([id number]) Wound Bed Granulation Amount: Medium (34-66%) Granulation Quality: Red, Pink Necrotic Amount: Medium (34-66%) Necrotic Quality: Adherent Slough Foul Odor After Cleansing: No Slough/Fibrino No [phone].pdf Page 11 of 11 Exposed Structure Fat Layer (Subcutaneous Tissue) Exposed: Yes Periwound Skin Texture Texture Color No Abnormalities Noted: Yes No Abnormalities Noted: Yes Moisture No Abnormalities Noted: Yes Electronic Signature(s) Signed: 06/08/2023 2:03:55 PM By: Brenton Grills Entered By: Brenton Grills on 05/18/2023 09:21:35 -------------------------------------------------------------------------------- Vitals Details Patient Name: Date of Service: [name], [name]. 05/18/2023 8:00 Mark M Medical Record Number: [id number] Patient Account Number: [id number] Date of Birth/Sex: Treating RN: November 14, 1960 (62 y.o. [name] Primary Care Tatayana Beshears: [facility] Other Clinician: Referring Evalisse Prajapati: Treating Mylee Falin/Extender: [name] in Treatment: 0 Vital Signs Time Taken: 08:17 Temperature ([name]): 98 Pulse (bpm): 61 Respiratory Rate (breaths/min): 18 Blood Pressure (mmHg): 105/70 Reference Range: 80 - 120 mg / dl Electronic Signature(s) Signed: 06/08/2023 2:03:55 PM By: Brenton Grills Entered By: Brenton Grills on 05/18/2023 08:18:50

## 2023-06-08 NOTE — Telephone Encounter (Signed)
Wife has called to report that pt is down to 10 days worth and it takes the [place] 10 days to fill medication, pt would like a call to discuss a Rx allowing pt to take the 4 a day of the pills now.

## 2023-06-08 NOTE — Telephone Encounter (Signed)
Faxed lamictal script to [place]

## 2023-06-08 NOTE — Progress Notes (Signed)
[name] ([id number]) [id number] Page 1 of 9 Visit Report for 05/18/2023 Chief Complaint Document Details Patient Name: Date of Service: [name] F. 05/18/2023 8:00 A M Medical Record Number: [id number] Patient Account Number: [id number] Date of Birth/Sex: Treating RN: 1961/08/01 (62 y.o. M) Primary Care Provider: [facility] Other Clinician: Referring Provider: Treating Provider/Extender: [name] in Treatment: 0 Information Obtained from: Patient Chief Complaint 05/18/2023; wounds to the left and right buttocks and upper back Electronic Signature(s) Signed: 05/18/2023 5:13:05 PM By: Geralyn Corwin DO Entered By: Geralyn Corwin on 05/18/2023 16:43:01 -------------------------------------------------------------------------------- HPI Details Patient Name: Date of Service: [name], [name] 05/18/2023 8:00 A M Medical Record Number: [id number] Patient Account Number: [id number] Date of Birth/Sex: Treating RN: 08/01/1961 (62 y.o. M) Primary Care Provider: [facility] Other Clinician: Referring Provider: Treating Provider/Extender: [name] in Treatment: 0 History of Present Illness HPI Description: 05/18/2023 [name] is a 62 year old male with a past medical history of multiple sclerosis and neurogenic bladder with chronic indwelling Foley catheter that presents to the clinic for a 1 year history of nonhealing ulcers to the bilateral ischium As well as upper back. He has an air mattress. He spends about 50% of the time in the bed and 50% of the time in the wheelchair. He does not have a Roho cushion. He has been using Medihoney and Santyl to the wound beds. He currently denies systemic signs of infection. Of note he was hospitalized on 2 occasions in January 2024 for trigeminal neuralgia. He has lost a significant amount of weight due to this and his oral intake has declined. He was  evaluated for PEG tube but was not a candidate. Electronic Signature(s) Signed: 05/18/2023 5:13:05 PM By: Geralyn Corwin DO Entered By: Geralyn Corwin on 05/18/2023 16:58:32 -------------------------------------------------------------------------------- Physical Exam Details Patient Name: Date of Service: [name], [name] 05/18/2023 8:00 A M Medical Record Number: [id number] Patient Account Number: [id number] Date of Birth/Sex: Treating RN: 1961-05-02 (62 y.o. M) Primary Care Provider: [facility] Other Clinician: ENMANUEL, RAUM ([id number]) [id number] Page 2 of 9 Referring Provider: Treating Provider/Extender: [name] in Treatment: 0 Constitutional respirations regular, non-labored and within target range for patient.Marland Kitchen Psychiatric pleasant and cooperative. Notes Extensive wounds to the ischium's bilaterally that probes close to bone however there is a layer of granulation tissue over this. Mild odor on exam. No surrounding signs of soft tissue infection including increased warmth, erythema or purulent drainage. T the upper back there is an open wound with nonviable o tissue and scant granulation tissue present. Again no signs of infection. Electronic Signature(s) Signed: 05/18/2023 5:13:05 PM By: Geralyn Corwin DO Entered By: Geralyn Corwin on 05/18/2023 16:52:32 -------------------------------------------------------------------------------- Physician Orders Details Patient Name: Date of Service: [name], [name] 05/18/2023 8:00 A M Medical Record Number: [id number] Patient Account Number: [id number] Date of Birth/Sex: Treating RN: 18-Dec-1960 (62 y.o. [name] Provider: [facility] Other Clinician: Referring Provider: Treating Provider/Extender: [name] in Treatment: 0 Verbal / Phone Orders: No Diagnosis Coding ICD-10 Coding Code Description G35 Multiple  sclerosis N31.9 Neuromuscular dysfunction of bladder, unspecified Follow-up Appointments Return Appointment in 2 weeks. Return appointment in 1 month. Anesthetic Wound #2 Proximal,Medial,Posterior Back (In clinic) Topical Lidocaine 4% applied to wound bed Wound #3 Left Gluteus (In clinic) Topical Lidocaine 4% applied to wound bed Wound #4 Right,Posterior Gluteus (In clinic) Topical Lidocaine 4% applied to wound bed  Bathing/ Shower/ Hygiene May shower with protection but do not get wound dressing(s) wet. Protect dressing(s) with water repellant cover (for example, large plastic bag) or a cast cover and may then take shower. Off-Loading Wound #2 Proximal,Medial,Posterior Back Turn and reposition every 2 hours Wound #3 Left Gluteus Turn and reposition every 2 hours Wound #4 Right,Posterior Gluteus Turn and reposition every 2 hours Home Health Wound #2 Proximal,Medial,Posterior Back Admit to Home Health for skilled nursing wound care. May utilize formulary equivalent dressing for wound treatment orders unless otherwise specified. Dressing changes to be completed by Home Health on Monday / Wednesday / Friday except when patient has scheduled visit at [facility], Rhoderick F ([id number]) [id number] Page 3 of 9 Care Center. Wound #3 Left Gluteus Admit to Home Health for skilled nursing wound care. May utilize formulary equivalent dressing for wound treatment orders unless otherwise specified. Dressing changes to be completed by Home Health on Monday / Wednesday / Friday except when patient has scheduled visit at [facility]. Wound #4 Right,Posterior Gluteus Admit to Home Health for skilled nursing wound care. May utilize formulary equivalent dressing for wound treatment orders unless otherwise specified. Dressing changes to be completed by Home Health on Monday / Wednesday / Friday except when patient has scheduled visit at [facility]. Wound  Treatment Wound #2 - Back Wound Laterality: Medial, Posterior, Proximal Topical: Santyl Collagenase Ointment, 30 (gm), tube 1 x Per Day/30 Days Prim Dressing: Hydrofera Blue Ready Transfer Foam, 4x5 (in/in) (DME) (Generic) 1 x Per Day/30 Days ary Discharge Instructions: Apply to wound bed as instructed Wound #3 - Gluteus Wound Laterality: Left Cleanser: Vashe 5.8 (oz) (DME) (Generic) 1 x Per Day/30 Days Discharge Instructions: Cleanse the wound with Vashe prior to applying a clean dressing using gauze sponges, not tissue or cotton balls. Cleanser: Dakins (DME) (Generic) 1 x Per Day/30 Days Discharge Instructions: wet to dry packing wound Prim Dressing: Dakins (DME) (Generic) 1 x Per Day/30 Days ary Discharge Instructions: wet to dry pack wound Secondary Dressing: Woven Gauze Sponge, Non-Sterile 4x4 in (DME) (Generic) 1 x Per Day/30 Days Discharge Instructions: Apply over primary dressing as directed. Secured With: [employer], 4.5x3.1 (in/yd) (DME) (Generic) 1 x Per Day/30 Days Discharge Instructions: Secure with Kerlix as directed. Wound #4 - Gluteus Wound Laterality: Right, Posterior Cleanser: Vashe 5.8 (oz) (DME) (Generic) 1 x Per Day/30 Days Discharge Instructions: Cleanse the wound with Vashe prior to applying a clean dressing using gauze sponges, not tissue or cotton balls. Cleanser: Dakins (DME) (Generic) 1 x Per Day/30 Days Discharge Instructions: wet to dry packing wound Prim Dressing: Dakins (DME) (Generic) 1 x Per Day/30 Days ary Discharge Instructions: wet to dry pack wound Secondary Dressing: Woven Gauze Sponge, Non-Sterile 4x4 in (DME) (Generic) 1 x Per Day/30 Days Discharge Instructions: Apply over primary dressing as directed. Secured With: [employer], 4.5x3.1 (in/yd) (DME) (Generic) 1 x Per Day/30 Days Discharge Instructions: Secure with Kerlix as directed. Radiology X-ray, other - Pelvic - 3 views - (ICD10 G35 - Multiple sclerosis) Electronic  Signature(s) Signed: 05/18/2023 5:13:05 PM By: Geralyn Corwin DO Entered By: Geralyn Corwin on 05/18/2023 16:52:45 Prescription 05/18/2023 -------------------------------------------------------------------------------- [name], Ibraheem F. Geralyn Corwin DO Patient Name: Provider: [birth year] [id number] Date of Birth: NPI#: Judie Petit [id number] Sex: DEA#: RYELEE, PEABODY ([id number]) [id number] Page 4 of 9 [phone] Phone #: License #: UPN: Patient Address: [address] Eligha Bridegroom [facility] [place], [place] [address] [address]  [place], [place] [address] [phone] Allergies gadopiclenol Provider's Orders X-ray, other - ICD10: G35 - Pelvic - 3 views Hand Signature: Date(s): Electronic Signature(s) Signed: 05/18/2023 5:13:05 PM By: Geralyn Corwin DO Entered By: Geralyn Corwin on 05/18/2023 16:52:45 -------------------------------------------------------------------------------- Problem List Details Patient Name: Date of Service: [name], [name] 05/18/2023 8:00 A M Medical Record Number: [id number] Patient Account Number: [id number] Date of Birth/Sex: Treating RN: 09/20/1961 (62 y.o. M) Primary Care Provider: [facility] Other Clinician: Referring Provider: Treating Provider/Extender: [name] in Treatment: 0 Active Problems ICD-10 Encounter Code Description Active Date MDM Diagnosis L89.314 Pressure ulcer of right buttock, stage 4 05/18/2023 No Yes L89.324 Pressure ulcer of left buttock, stage 4 05/18/2023 No Yes L89.103 Pressure ulcer of unspecified part of back, stage 3 05/18/2023 No Yes E44.0 Moderate protein-calorie malnutrition 05/18/2023 No Yes G35 Multiple sclerosis 05/18/2023 No Yes N31.9 Neuromuscular dysfunction of bladder, unspecified 05/18/2023 No Yes Inactive Problems Resolved Problems MENELEY, Trek F ([id number]) [id number] Page  5 of 9 Electronic Signature(s) Signed: 05/18/2023 5:13:05 PM By: Geralyn Corwin DO Entered By: Geralyn Corwin on 05/18/2023 16:51:16 -------------------------------------------------------------------------------- Progress Note Details Patient Name: Date of Service: [name], [name] 05/18/2023 8:00 A M Medical Record Number: [id number] Patient Account Number: [id number] Date of Birth/Sex: Treating RN: Nov 10, 1960 (62 y.o. M) Primary Care Provider: [facility] Other Clinician: Referring Provider: Treating Provider/Extender: [name] in Treatment: 0 Subjective Chief Complaint Information obtained from Patient 05/18/2023; wounds to the left and right buttocks and upper back History of Present Illness (HPI) 05/18/2023 Mr. Bun Rinne is a 62 year old male with a past medical history of multiple sclerosis and neurogenic bladder with chronic indwelling Foley catheter that presents to the clinic for a 1 year history of nonhealing ulcers to the bilateral ischium As well as upper back. He has an air mattress. He spends about 50% of the time in the bed and 50% of the time in the wheelchair. He does not have a Roho cushion. He has been using Medihoney and Santyl to the wound beds. He currently denies systemic signs of infection. Of note he was hospitalized on 2 occasions in January 2024 for trigeminal neuralgia. He has lost a significant amount of weight due to this and his oral intake has declined. He was evaluated for PEG tube but was not a candidate. Patient History Information obtained from Chart. Allergies gadopiclenol Medical History Respiratory Patient has history of Asthma Medical A Surgical History Notes nd Ear/Nose/Mouth/Throat Dysphagia, migraines Neurologic Trigeminal neuralgia Psychiatric Depression Review of Systems (ROS) Ear/Nose/Mouth/Throat Denies complaints or symptoms of Chronic sinus problems or rhinitis. Integumentary (Skin) Complains or  has symptoms of Wounds - back and legs. Musculoskeletal Complains or has symptoms of Muscle Weakness - MS. Objective Constitutional respirations regular, non-labored and within target range for patient.. Vitals Time Taken: 8:17 AM, Temperature: 98 F, Pulse: 61 bpm, Respiratory Rate: 18 breaths/min, Blood Pressure: 105/70 mmHg. Psychiatric pleasant and cooperative. General Notes: Extensive wounds to the ischium's bilaterally that probes close to bone however there is a layer of granulation tissue over this. Mild odor on exam. No surrounding signs of soft tissue infection including increased warmth, erythema or purulent drainage. T the upper back there is an open wound with Posey Boyer, Francis F ([id number]) [id number] Page 6 of 9 nonviable tissue and scant granulation tissue present. Again no signs of infection. Integumentary (Hair, Skin) Wound #2 status is Open. Original cause of wound was Gradually Appeared. The date acquired was: 05/14/2022. The wound  is located on the Proximal,Medial,Posterior Back. The wound measures 2.5cm length x 1.1cm width x 0.2cm depth; 2.16cm^2 area and 0.432cm^3 volume. There is Fat Layer (Subcutaneous Tissue) exposed. There is no tunneling noted. There is a medium amount of serosanguineous drainage noted. The wound margin is distinct with the outline attached to the wound base. There is medium (34-66%) red, pink granulation within the wound bed. There is a medium (34-66%) amount of necrotic tissue within the wound bed including Adherent Slough. The periwound skin appearance exhibited: Scarring, Hemosiderin Staining. The periwound skin appearance did not exhibit: Dry/Scaly, Maceration. Periwound temperature was noted as No Abnormality. Wound #3 status is Open. Original cause of wound was Gradually Appeared. The date acquired was: 05/14/2022. The wound is located on the Left Gluteus. The wound measures 5cm length x 3.3cm width x 3.5cm depth;  12.959cm^2 area and 45.357cm^3 volume. There is Fat Layer (Subcutaneous Tissue) exposed. There is tunneling at 10:00 with a maximum distance of 3.5cm. There is a medium amount of serosanguineous drainage noted. The wound margin is distinct with the outline attached to the wound base. There is medium (34-66%) red, pink granulation within the wound bed. There is a medium (34-66%) amount of necrotic tissue within the wound bed including Adherent Slough. The periwound skin appearance exhibited: Scarring, Hemosiderin Staining. The periwound skin appearance did not exhibit: Dry/Scaly, Maceration. Periwound temperature was noted as No Abnormality. Wound #4 status is Open. Original cause of wound was Pressure Injury. The date acquired was: 05/14/2022. The wound is located on the Right,Posterior Gluteus. The wound measures 8.2cm length x 3.6cm width x 3.5cm depth; 23.185cm^2 area and 81.147cm^3 volume. There is Fat Layer (Subcutaneous Tissue) exposed. There is no undermining noted, however, there is tunneling at 11:00 with a maximum distance of 5.1cm. There is a medium amount of serous drainage noted. There is medium (34-66%) red, pink granulation within the wound bed. There is a medium (34-66%) amount of necrotic tissue within the wound bed including Adherent Slough. The periwound skin appearance had no abnormalities noted for texture. The periwound skin appearance had no abnormalities noted for moisture. The periwound skin appearance had no abnormalities noted for color. 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 He has a failure to thrive picture  and was not a candidate for feeding tube when hospitalized in January 2024 in the setting of malnutrition from trigeminal neuralgia. Patient has MS and is bedbound or wheelchair-bound. He cannot offload the wound beds without help. We discussed the importance of aggressive offloading for his wound healing. His wife is his main caregiver and has trouble doing the wound care by herself. We will try and order home health to help with wound care. Nutrition is also of concern. I recommended increasing protein intake. His albumin is 2.7. For wound dressings I recommended Dakin's wet-to-dry dressings to the ischial wounds and Santyl with Hydrofera Blue to the back wound. Also due to the chronicity of the wounds I recommended obtaining an x-ray of the pelvis. Overall prognosis is poor. Follow-up in 2 weeks. Plan Follow-up Appointments: Return Appointment in 2 weeks. Return appointment in 1 month. Anesthetic: Wound #2 Proximal,Medial,Posterior Back: (In clinic) Topical Lidocaine 4% applied to wound bed Wound #  3 Left Gluteus: (In clinic) Topical Lidocaine 4% applied to wound bed Wound #4 Right,Posterior Gluteus: (In clinic) Topical Lidocaine 4% applied to wound bed Bathing/ Shower/ Hygiene: May shower with protection but do not get wound dressing(s) wet. Protect dressing(s) with water repellant cover (for example, large plastic bag) or a cast cover and may then take shower. Off-Loading: Wound #2 Proximal,Medial,Posterior Back: Turn and reposition every 2 hours Wound #3 Left Gluteus: Turn and reposition every 2 hours Wound #4 Right,Posterior Gluteus: Turn and reposition every 2 hours Home Health: Wound #2 Proximal,Medial,Posterior Back: Admit to Home Health for skilled nursing wound care. May utilize formulary equivalent dressing for wound treatment orders unless otherwise specified. Dressing changes to be completed by Home Health on Monday / Wednesday / Friday except when patient has scheduled  visit at [facility]. Wound #3 Left Gluteus: Admit to Home Health for skilled nursing wound care. May utilize formulary equivalent dressing for wound treatment orders unless otherwise specified. Dressing changes to be completed by Home Health on Monday / Wednesday / Friday except when patient has scheduled visit at [facility]. Wound #4 Right,Posterior Gluteus: Admit to Home Health for skilled nursing wound care. May utilize formulary equivalent dressing for wound treatment orders unless otherwise specified. Dressing changes to be completed by Home Health on Monday / Wednesday / Friday except when patient has scheduled visit at [facility]. Radiology ordered were: X-ray, other - Pelvic - 3 views WOUND #2: - Back Wound Laterality: Medial, Posterior, Proximal Topical: Santyl Collagenase Ointment, 30 (gm), tube 1 x Per Day/30 Days Prim Dressing: Hydrofera Blue Ready Transfer Foam, 4x5 (in/in) (DME) (Generic) 1 x Per Day/30 Days Gracy Bruins ([id number]) [id number] Page 7 of 9 Discharge Instructions: Apply to wound bed as instructed WOUND #3: - Gluteus Wound Laterality: Left Cleanser: Vashe 5.8 (oz) (DME) (Generic) 1 x Per Day/30 Days Discharge Instructions: Cleanse the wound with Vashe prior to applying a clean dressing using gauze sponges, not tissue or cotton balls. Cleanser: Dakins (DME) (Generic) 1 x Per Day/30 Days Discharge Instructions: wet to dry packing wound Prim Dressing: Dakins (DME) (Generic) 1 x Per Day/30 Days ary Discharge Instructions: wet to dry pack wound Secondary Dressing: Woven Gauze Sponge, Non-Sterile 4x4 in (DME) (Generic) 1 x Per Day/30 Days Discharge Instructions: Apply over primary dressing as directed. Secured With: [employer], 4.5x3.1 (in/yd) (DME) (Generic) 1 x Per Day/30 Days Discharge Instructions: Secure with Kerlix as directed. WOUND #4: - Gluteus Wound Laterality: Right, Posterior Cleanser:  Vashe 5.8 (oz) (DME) (Generic) 1 x Per Day/30 Days Discharge Instructions: Cleanse the wound with Vashe prior to applying a clean dressing using gauze sponges, not tissue or cotton balls. Cleanser: Dakins (DME) (Generic) 1 x Per Day/30 Days Discharge Instructions: wet to dry packing wound Prim Dressing: Dakins (DME) (Generic) 1 x Per Day/30 Days ary Discharge Instructions: wet to dry pack wound Secondary Dressing: Woven Gauze Sponge, Non-Sterile 4x4 in (DME) (Generic) 1 x Per Day/30 Days Discharge Instructions: Apply over primary dressing as directed. Secured With: [employer], 4.5x3.1 (in/yd) (DME) (Generic) 1 x Per Day/30 Days Discharge Instructions: Secure with Kerlix as directed. 1. Dakin's wet-to-dry dressings 2. Aggressive offloading 3. Order home health 4. Hydrofera Blue and Santyl 5. Pelvic x-ray 6. Follow-up in 2 weeks 7. Order Nature conservation officer) Signed: 05/18/2023 5:13:05 PM By: Geralyn Corwin DO Entered By: Geralyn Corwin on 05/18/2023 16:59:31 -------------------------------------------------------------------------------- HxROS Details Patient Name: Date of Service: [name], A DA M  F. 05/18/2023 8:00 A M Medical Record Number: [id number] Patient Account Number: [id number] Date of Birth/Sex: Treating RN: 1961/03/02 (62 y.o. [name] Provider: [facility] Other Clinician: Referring Provider: Treating Provider/Extender: [name] in Treatment: 0 Information Obtained From Chart Ear/Nose/Mouth/Throat Complaints and Symptoms: Negative for: Chronic sinus problems or rhinitis Medical History: Past Medical History Notes: Dysphagia, migraines Integumentary (Skin) Complaints and Symptoms: Positive for: Wounds - back and legs Musculoskeletal Complaints and Symptoms: Positive for: Muscle Weakness - MS Respiratory Medical HistoryHAEDYN, ODLE ([id number]) [id number]  Page 8 of 9 Positive for: Asthma Neurologic Medical History: Past Medical History Notes: Trigeminal neuralgia Psychiatric Medical History: Past Medical History Notes: Depression Immunizations Pneumococcal Vaccine: Received Pneumococcal Vaccination: No Implantable Devices No devices added Electronic Signature(s) Signed: 05/18/2023 5:13:05 PM By: Geralyn Corwin DO Signed: 06/08/2023 2:03:55 PM By: Brenton Grills Entered By: Brenton Grills on 05/18/2023 08:19:30 -------------------------------------------------------------------------------- SuperBill Details Patient Name: Date of Service: [name], [name] 05/18/2023 Medical Record Number: [id number] Patient Account Number: [id number] Date of Birth/Sex: Treating RN: Jan 07, 1961 (62 y.o. [name] Provider: [facility] Other Clinician: Referring Provider: Treating Provider/Extender: [name] in Treatment: 0 Diagnosis Coding ICD-10 Codes Code Description L89.314 Pressure ulcer of right buttock, stage 4 L89.324 Pressure ulcer of left buttock, stage 4 G35 Multiple sclerosis N31.9 Neuromuscular dysfunction of bladder, unspecified L89.103 Pressure ulcer of unspecified part of back, stage 3 Facility Procedures : CPT4 Code: [id number] Description: 99205 - WOUND CARE VISIT-LEV 5 NEW PT Modifier: Quantity: 1 Physician Procedures : CPT4 Code Description Modifier [id number] - WC PHYS LEVEL 4 - NEW PT ICD-10 Diagnosis Description L89.314 Pressure ulcer of right buttock, stage 4 L89.324 Pressure ulcer of left buttock, stage 4 G35 Multiple sclerosis L89.103 Pressure ulcer of  unspecified part of back, stage 3 Quantity: 1 Electronic Signature(s) Signed: 05/18/2023 5:13:05 PM By: Radene Gunning, Kionte F ([id number]) PM By: Geralyn Corwin DO [id number] Page 9 of 9 Signed: 05/18/2023 5:13:05 Entered By: Geralyn Corwin on 05/18/2023 16:59:37

## 2023-06-14 NOTE — Progress Notes (Unsigned)
[facility]  PATIENT: [name] DOB: 11-27-1960  REFERRING DOCTOR OR PCP: [name], MD SOURCE: Patient, notes from Dr. Anne Hahn, imaging and lab reports, MRI images personally reviewed.  _________________________________   HISTORICAL  CHIEF COMPLAINT:  No chief complaint on file.   HISTORY OF PRESENT ILLNESS:  [name] is a 62 y.o. man with a relapsing form of secondary progressive MS.  Update 06/15/2023:  Trigeminal neuralgia:   MRI of the brain with added attention to the trigeminal nerves showed a loop of the right superior cerebellar artery that effaces the cisternal segment of the right trigeminal nerve without causing definite compression.  There is no dorsal root entry zone or transitional zone compression.  There are multiple brainstem lesions but none near the dorsal root entry zone of the trigeminal nerve.  Currently he takes lamotrigine 100 mg po tid and pregabalin 150 mg po tid.     If pain is more severe he can go up to 200 mg po tid and Lyrica 150 mg up to 4 a day.     He sometimes takes an oxycodone at night.    He saw [facility] and had a block followed by an aboation for the right TN.    In the hospital fosphenytoin infusion may have given benefit.  When pain is severe he does not eat and lost 10 pounds before last hospitalization.  Due to anatomy, he is not a good candidate for a simple feeding tube and recovery from open surgery might be slow.     He had a recent hospitalization for TN exacerbation.   He has bed sores that are healing.     He saw wound care while in the hospital and wife was instructed in dressing.  A nurse also comes to the house intermittently.   The sores are a little better.   He uses Aquacel and Metahoney.   His wife feels they are improving.        MS:  He has no definite exacerbation but has slow progression.  He is on Ocrevus as his disease modifying therapy and tolerates it well.  He has not had  any exacerbations since starting it around 2018 or 2019 .  Next infusion is July 2024.  Lymphocyte count was 0.5 in the hospital.     He is wheelchair bound.    He last used a cane in 2009 and then [place] crutches..   He has needed a lift to transfer since a long hospital stay for a pseudomonas UTI (urosepsis) May 2021.     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   His spasticity is bothersome but baclofen makes him sleepy.  Therefore he usually just takes 1 a day..  Tizanidine caused hallucinations.  He has numbness waist down.  He notes more issues with word finding.   He has dysphagia and saw speech therapy in the hospital and diet consistency was changed.    He has noted more left arm weakness/spasticity.   Baclofen makes him sleepy so he just takes 1 as needed.   They have some exercise equipment from OT but does not use it    He has a strong FH of MS with two sisters, one niece.   He [place] many people with [place].     MS History: He was diagnosed in 2007 after presenting with gradual worsening of balance.  Around that time, he had  hurt his knee but even when knee improved, he flet off balance and would hold the wall or furniture at time.   In retrospect, he had some symptoms in 2001.  At that me an MRI was reportedly normal.  That year, he had fairly sudden onset of numbness below his waist that gradually improved over the next few weeks.     He was in a trial comparing Rebif and Betaseron n 2007 and was initially on Rebif and then changed to Betaseron.   He had injection site reactions.   He started taking GIlenya in 2011.    He switched to Ocrevus in 2019.     IMAGING: MRI of the face 11/09/2022 showed no vascular loop compressing the right trigeminal nerve though the superior cerebellar artery was adjacent to the cisternal segment of the nerve.  There were multiple brainstem lesions but none near the dorsal root entry zone on the right.  MRI  of the brain and cervical spine 07/07/2022 was unchanged compared to 2020.  MRI of the head 07/11/2019 shows T2 hyperintense foci within the left posterior medulla, cerebellar hemispheres, lower left central pons, right midbrain, left thalamus, and in the periventricular, juxtacortical and deep white matter of both hemispheres.  No definite changes compared to the MRI from 08/01/2014.  MRI cervical spine 07/11/2019 shows foci seen on the previous MRI as well as a focus at T2-T3.  MRI of the brain 08/01/2014 shows T2 hyperintense foci within the left posterior medulla, cerebellar hemispheres, lower left central pons, right midbrain, left thalamus, and in the periventricular, juxtacortical and deep white matter of both hemispheres.  No definite changes compared to the MRI from 08/01/2014.  The infratentorial lesions were not clearly present on the previous MRI from 2007 and there has also been some progression in the hemispheres.Marland Kitchen  MRI of the brain 01/11/2006 shows T2/FLAIR hyperintense foci in the hemispheres consistent with MS  MRI of the cervical spine 01/10/2006 shows T2 hyperintense foci within the spinal cord Adjacent to C5, centrally towards the left adjacent to T1 and at T2-T3  REVIEW OF SYSTEMS: Constitutional: No fevers, chills, sweats, or change in appetite Eyes: No visual changes, double vision, eye pain Ear, nose and throat: No hearing loss, ear pain, nasal congestion, sore throat Cardiovascular: No chest pain, palpitations Respiratory:  No shortness of breath at rest or with exertion.   No wheezes GastrointestinaI: No nausea, vomiting, diarrhea, abdominal pain, fecal incontinence Genitourinary:  No dysuria, urinary retention or frequency.  No nocturia. Musculoskeletal:  No neck pain, back pain Integumentary: No rash, pruritus, skin lesions Neurological: as above Psychiatric: No depression at this time.  No anxiety Endocrine: No palpitations, diaphoresis, change in appetite, change in  weigh or increased thirst Hematologic/Lymphatic:  No anemia, purpura, petechiae. Allergic/Immunologic: No itchy/runny eyes, nasal congestion, recent allergic reactions, rashes  ALLERGIES: Allergies  Allergen Reactions   Gadopiclenol Nausea And Vomiting   Zosyn [Piperacillin-Tazobactam In Dex]     Not allergic per family     HOME MEDICATIONS:  Current Outpatient Medications:    acetaminophen (TYLENOL) 500 MG tablet, Take 1,000 mg by mouth in the morning, at noon, and at bedtime., Disp: , Rfl:    albuterol (VENTOLIN HFA) 108 (90 Base) MCG/ACT inhaler, Inhale 2 puffs into the lungs every 6 (six) hours as needed., Disp: 1 each, Rfl: 3   ascorbic acid (VITAMIN C) 500 MG tablet, Take 500 mg by mouth 2 (two) times daily., Disp: , Rfl:    baclofen (LIORESAL) 10  MG tablet, Take 10 mg by mouth daily as needed for muscle spasms., Disp: , Rfl:    Carboxymethylcellulose Sodium (THERATEARS OP), Place 2-3 drops into both eyes as needed (dry eye)., Disp: , Rfl:    collagenase (SANTYL) 250 UNIT/GM ointment, Apply 1 Application topically daily. (Patient not taking: Reported on 05/21/2023), Disp: 90 g, Rfl: 3   cyanocobalamin 1000 MCG tablet, Take 1 tablet (1,000 mcg total) by mouth daily., Disp: , Rfl:    escitalopram (LEXAPRO) 10 MG tablet, Take 1 tablet (10 mg total) by mouth daily., Disp: 90 tablet, Rfl: 3   ferrous sulfate 325 (65 FE) MG EC tablet, Take 1 tablet (325 mg total) by mouth 2 (two) times daily., Disp: 180 tablet, Rfl: 1   ibuprofen (ADVIL) 200 MG tablet, Take 600 mg by mouth as needed for headache or moderate pain., Disp: , Rfl:    lamoTRIgine (LAMICTAL) 100 MG tablet, Take 1-2 tablets (100-200 mg total) by mouth QID. Take 100 mg by mouth in the morning and at lunch. Take 200 mg by mouth at bedtime., Disp: 360 tablet, Rfl: 1   leptospermum manuka honey (MEDIHONEY) PSTE paste, Apply 1 Application topically daily., Disp: 15 mL, Rfl: 0   modafinil (PROVIGIL) 100 MG tablet, Take 1 tablet (100 mg  total) by mouth in the morning., Disp: 90 tablet, Rfl: 1   ocrelizumab 600 mg in sodium chloride 0.9 % 500 mL, Inject 600 mg into the vein every 6 (six) months. , Disp: , Rfl:    oxyCODONE-acetaminophen (PERCOCET) 5-325 MG tablet, Take 1 tablet by mouth every 12 (twelve) hours as needed for severe pain. (Patient taking differently: Take 1 tablet by mouth as needed for severe pain.), Disp: 60 tablet, Rfl: 0   potassium chloride SA (KLOR-CON M) 20 MEQ tablet, Take 2 tablets (40 mEq total) by mouth daily. 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   Trigeminal neuralgia of right side of face     PAST SURGICAL HISTORY: Past Surgical History:  Procedure Laterality Date   eye surgeries     x 2; bilateral 74 and 76   EYE SURGERY      FAMILY HISTORY: Family History  Problem Relation Age of Onset   Cancer Father    Multiple sclerosis Sister    Seizures Maternal Uncle    Parkinsonism Maternal Uncle    Multiple sclerosis Sister    Multiple sclerosis Paternal Uncle    Multiple sclerosis Other    Lung cancer Other        parent   Uterine cancer Other        other    SOCIAL HISTORY:  Social History   Socioeconomic History   Marital status: Married    Spouse name: joy   Number of children: 2   Years of education: college   Highest education level: Not on file  Occupational History  Occupation: disabled  Tobacco Use   Smoking status: Former    Current packs/day: 0.50     Types: Cigarettes    Passive exposure: Past   Smokeless tobacco: Never  Vaping Use   Vaping status: Not on file  Substance and Sexual Activity   Alcohol use: No    Comment: rare    Drug use: Yes    Types: Oxycodone   Sexual activity: Not Currently    Comment: disease progress  Other Topics Concern   Not on file  Social History Narrative   Lives at home, married   Unable to use right hand   Patient drinks 1 cup caffeine daily.   Social Determinants of Health   Financial Resource Strain: Not on file  Food Insecurity: No Food Insecurity (05/21/2023)   Hunger Vital Sign    Worried About Running Out of Food in the Last Year: Never true    Ran Out of Food in the Last Year: Never true  Transportation Needs: No Transportation Needs (05/21/2023)   PRAPARE - Administrator, Civil Service (Medical): No    Lack of Transportation (Non-Medical): No  Physical Activity: Not on file  Stress: Not on file  Social Connections: Unknown (07/07/2022)   [facility]   Social Network    Social Network: Not on file  Intimate Partner Violence: Not At Risk (05/21/2023)   Humiliation, Afraid, Rape, and Kick questionnaire    Fear of Current or Ex-Partner: No    Emotionally Abused: No    Physically Abused: No    Sexually Abused: No     PHYSICAL EXAM  There were no vitals filed for this visit.   There is no height or weight on file to calculate BMI.   General: The patient is well-developed and well-nourished and in no acute distress  HEENT:  Head is [place]/AT.  Sclera are anicteric.    Skin: Extremities are without rash or  edema.  Musculoskeletal:  Back is nontender  Neurologic Exam  Mental status: The patient is alert and oriented x 3 at the time of the examination. The patient has apparent normal recent and remote memory, with an apparently normal attention span and concentration ability.   Speech is normal.  Cranial nerves: Extraocular movements show  an INO right greater than left and also some nystagmus on upgaze.  Reduced visual acuity OD.  Facial symmetry is present.  Facial strength appears symmetric.  However, he has allodynia with altered sensation in the V2 distribution more than the V3 distribution and to a lesser extent in the V1 distribution.  Trapezius and sternocleidomastoid strength is normal.  Mild dysarthria is noted.  The tongue is midline, and the patient has symmetric elevation of the soft palate. No obvious hearing deficits are noted.  Motor:  Muscle bulk is normal.   Tone is increased legs > arms; right > left . Strength is  3/5 in left arm grip but 2/5 in the shoulder, 1/5 right arm, 0/5 in legs.   Sensory: He is unable to feel touch or vibration in the legs.  Gait and station: Wheelchair bound  Reflexes: Deep tendon reflexes are symmetric and increased in legs with sustained ankle clonus.   Plantar responses are extensor .    DIAGNOSTIC DATA (LABS, IMAGING, TESTING) - I reviewed patient records, labs, notes, testing and imaging myself where available.  Lab Results  Component Value Date   WBC 8.8 05/24/2023   HGB 9.4 (L) 05/24/2023  HCT 30.9 (L) 05/24/2023   MCV 88.3 05/24/2023   PLT 272 05/24/2023      Component Value Date/Time   NA 140 05/24/2023 0203   NA 141 01/26/2022 1207   K 4.0 05/24/2023 0203   CL 107 05/24/2023 0203   CO2 24 05/24/2023 0203   GLUCOSE 119 (H) 05/24/2023 0203   BUN 18 05/24/2023 0203   BUN 13 01/26/2022 1207   CREATININE 0.69 05/24/2023 0203   CALCIUM 8.1 (L) 05/24/2023 0203   PROT 5.6 (L) 05/21/2023 0924   PROT 6.6 01/26/2022 1207   ALBUMIN 2.4 (L) 05/24/2023 0203   ALBUMIN 4.6 01/26/2022 1207   AST 10 (L) 05/21/2023 0924   ALT 9 05/21/2023 0924   ALKPHOS 108 05/21/2023 0924   BILITOT 0.6 05/21/2023 0924   BILITOT 0.5 01/26/2022 1207   GFRNONAA >60 05/24/2023 0203   GFRAA 84 07/29/2020 1429   Lab Results  Component Value Date   CHOL 234 (H) 08/14/2015   HDL 47.80  08/14/2015   LDLCALC 163 (H) 08/14/2015   LDLDIRECT 192.1 08/04/2012   TRIG 116.0 08/14/2015   CHOLHDL 5 08/14/2015   Lab Results  Component Value Date   HGBA1C 5.4 08/14/2015   Lab Results  Component Value Date   VITAMINB12 110 (L) 05/23/2023   Lab Results  Component Value Date   TSH 0.746 08/19/2022       ASSESSMENT AND PLAN  No diagnosis found.   He has trigeminal neuralgia right.  Continue lamotrigine up to 200 mg po tid   continue baclofen and try to go up to 3 times a day if tolerated (had stopped).   Continue Lyrica up to 600 mg/day He will have a procedure with anesthesiology pain  in 1-2 weeks.   I do not believe he is having an exacerbation as the strength improved after he received IV fluids.   COntinue with wound care (wife sends frequent photos to wound care nurse who advises and sees him intermittently) Ocrevus 600 mg q 6 months They will return to see me in 4 months or sooner if there are new or worsening neurologic symptoms.  42-minute office visit with the majority of the time spent face-to-face for history and physical, discussion/counseling and decision-making.  Additional time with record review (recent hospitalization and imaging) and documentation.  Jeremaih Klima A. Epimenio Foot, MD, [facility] 06/14/2023, 4:00 PM Certified in Neurology, Clinical Neurophysiology, Sleep Medicine and Neuroimaging  [facility] [address], [address] [place], [place] [address] [phone]

## 2023-06-15 ENCOUNTER — Encounter: Payer: Self-pay | Admitting: Neurology

## 2023-06-15 ENCOUNTER — Ambulatory Visit (INDEPENDENT_AMBULATORY_CARE_PROVIDER_SITE_OTHER): Payer: No Typology Code available for payment source | Admitting: Neurology

## 2023-06-15 VITALS — BP 100/64 | HR 75

## 2023-06-15 DIAGNOSIS — E43 Unspecified severe protein-calorie malnutrition: Secondary | ICD-10-CM

## 2023-06-15 DIAGNOSIS — G35 Multiple sclerosis: Secondary | ICD-10-CM

## 2023-06-15 DIAGNOSIS — N319 Neuromuscular dysfunction of bladder, unspecified: Secondary | ICD-10-CM

## 2023-06-15 DIAGNOSIS — Z79899 Other long term (current) drug therapy: Secondary | ICD-10-CM | POA: Diagnosis not present

## 2023-06-15 DIAGNOSIS — G8250 Quadriplegia, unspecified: Secondary | ICD-10-CM

## 2023-06-15 DIAGNOSIS — D649 Anemia, unspecified: Secondary | ICD-10-CM

## 2023-06-16 LAB — IGG, IGA, IGM
IgA/Immunoglobulin A, Serum: 126 mg/dL (ref 90–386)
IgG (Immunoglobin G), Serum: 483 mg/dL — ABNORMAL LOW (ref 603–1613)
IgM (Immunoglobulin M), Srm: 25 mg/dL (ref 20–172)

## 2023-06-16 LAB — COMPREHENSIVE METABOLIC PANEL
ALT: 6 IU/L (ref 0–44)
AST: 7 IU/L (ref 0–40)
Albumin: 3.9 g/dL (ref 3.8–4.9)
Alkaline Phosphatase: 144 IU/L — ABNORMAL HIGH (ref 44–121)
BUN/Creatinine Ratio: 25 — ABNORMAL HIGH (ref 10–24)
BUN: 15 mg/dL (ref 8–27)
Bilirubin Total: 0.3 mg/dL (ref 0.0–1.2)
CO2: 27 mmol/L (ref 20–29)
Calcium: 9.2 mg/dL (ref 8.6–10.2)
Chloride: 99 mmol/L (ref 96–106)
Creatinine, Ser: 0.6 mg/dL — ABNORMAL LOW (ref 0.76–1.27)
Globulin, Total: 1.9 g/dL (ref 1.5–4.5)
Glucose: 81 mg/dL (ref 70–99)
Potassium: 4 mmol/L (ref 3.5–5.2)
Sodium: 141 mmol/L (ref 134–144)
Total Protein: 5.8 g/dL — ABNORMAL LOW (ref 6.0–8.5)
eGFR: 111 mL/min/{1.73_m2} (ref >59)

## 2023-06-16 LAB — CBC WITH DIFFERENTIAL/PLATELET
Basophils Absolute: 0 10*3/uL (ref 0.0–0.2)
Basos: 0 %
EOS (ABSOLUTE): 0.1 10*3/uL (ref 0.0–0.4)
Eos: 1 %
Hematocrit: 32.5 % — ABNORMAL LOW (ref 37.5–51.0)
Hemoglobin: 10 g/dL — ABNORMAL LOW (ref 13.0–17.7)
Immature Grans (Abs): 0.1 10*3/uL (ref 0.0–0.1)
Immature Granulocytes: 1 %
Lymphocytes Absolute: 0.8 10*3/uL (ref 0.7–3.1)
Lymphs: 6 %
MCH: 26.4 pg — ABNORMAL LOW (ref 26.6–33.0)
MCHC: 30.8 g/dL — ABNORMAL LOW (ref 31.5–35.7)
MCV: 86 fL (ref 79–97)
Monocytes Absolute: 1.3 10*3/uL — ABNORMAL HIGH (ref 0.1–0.9)
Monocytes: 9 %
Neutrophils Absolute: 11.6 10*3/uL — ABNORMAL HIGH (ref 1.4–7.0)
Neutrophils: 83 %
Platelets: 359 10*3/uL (ref 150–450)
RBC: 3.79 x10E6/uL — ABNORMAL LOW (ref 4.14–5.80)
RDW: 15.7 % — ABNORMAL HIGH (ref 11.6–15.4)
WBC: 13.9 10*3/uL — ABNORMAL HIGH (ref 3.4–10.8)

## 2023-06-16 LAB — VITAMIN B12: Vitamin B-12: 603 pg/mL (ref 232–1245)

## 2023-06-17 ENCOUNTER — Encounter (HOSPITAL_BASED_OUTPATIENT_CLINIC_OR_DEPARTMENT_OTHER): Payer: No Typology Code available for payment source | Attending: Internal Medicine | Admitting: Internal Medicine

## 2023-06-17 DIAGNOSIS — G35 Multiple sclerosis: Secondary | ICD-10-CM | POA: Insufficient documentation

## 2023-06-17 DIAGNOSIS — L89103 Pressure ulcer of unspecified part of back, stage 3: Secondary | ICD-10-CM | POA: Diagnosis not present

## 2023-06-17 DIAGNOSIS — N319 Neuromuscular dysfunction of bladder, unspecified: Secondary | ICD-10-CM | POA: Insufficient documentation

## 2023-06-17 DIAGNOSIS — L89102 Pressure ulcer of unspecified part of back, stage 2: Secondary | ICD-10-CM | POA: Insufficient documentation

## 2023-06-17 DIAGNOSIS — L89314 Pressure ulcer of right buttock, stage 4: Secondary | ICD-10-CM | POA: Diagnosis not present

## 2023-06-17 DIAGNOSIS — L89324 Pressure ulcer of left buttock, stage 4: Secondary | ICD-10-CM | POA: Diagnosis not present

## 2023-06-17 DIAGNOSIS — E440 Moderate protein-calorie malnutrition: Secondary | ICD-10-CM | POA: Diagnosis not present

## 2023-06-17 DIAGNOSIS — L89312 Pressure ulcer of right buttock, stage 2: Secondary | ICD-10-CM | POA: Diagnosis not present

## 2023-06-18 NOTE — Progress Notes (Addendum)
applied to wound bed Bathing/ Shower/ Hygiene Other Bathing/Shower/Hygiene Orders/Instructions: - May bathe with soap and water Off-Loading Turn and reposition every 2 hours Other: - only an hour up to eat and back to bed. Turn every 2 hours. Home Health New wound care orders this week; continue Home Health for wound care. May utilize formulary equivalent dressing for wound treatment orders unless otherwise specified. - Dakin's solution wet to dry to wounds #4 and #6. Santyl and hydrofera blue bordered foam to both back wounds. Other Home Health Orders/Instructions: - Please see the patient at least once a week OR please see the patient's spouse regarding wound supplies ( to order supplies for the patient) [facility] Wound Treatment Wound #2 - Back Wound Laterality: Medial Cleanser: Vashe 5.8 (oz) (Home Health) 1 x Per Day/30 Days Discharge Instructions: Cleanse the wound with Vashe prior to applying a clean dressing using  gauze sponges, not tissue or cotton balls. Peri-Wound Care: Skin Prep (Home Health) 1 x Per Day/30 Days Discharge Instructions: Use skin prep as directed Topical: Santyl Collagenase Ointment, 30 (gm), tube 1 x Per Day/30 Days Prim Dressing: Hydrofera Blue Ready Transfer Foam, 4x5 (in/in) (Generic) 1 x Per Day/30 Days ary Discharge Instructions: Apply to wound bed as instructed Secondary Dressing: Zetuvit Plus Silicone Border Dressing 4x4 (in/in) (Home Health) 1 x Per Day/30 Days Discharge Instructions: Apply silicone border over primary dressing as directed. Wound #3 - Gluteus Wound Laterality: Left Cleanser: Vashe 5.8 (oz) (Home Health) (Generic) 1 x Per Day/30 Days Discharge Instructions: Cleanse the wound with Vashe prior to applying a clean dressing using gauze sponges, not tissue or cotton balls. Prim Dressing: Dakin's Solution 0.25%, 16 (oz) (Home Health) 1 x Per Day/30 Days ary Discharge Instructions: Moisten gauze with Dakin's solution Secondary Dressing: Zetuvit Plus Silicone Border Dressing 7x7(in/in) (Home Health) 1 x Per Day/30 Days Discharge Instructions: Apply silicone border over primary dressing as directed. Wound #4 - Gluteal fold Wound Laterality: Right Cleanser: Vashe 5.8 (oz) (Home Health) (Generic) 1 x Per Day/30 Days Discharge Instructions: Cleanse the wound with Vashe prior to applying a clean dressing using gauze sponges, not tissue or cotton balls. Prim Dressing: Dakin's Solution 0.25%, 16 (oz) (Home Health) 1 x Per Day/30 Days ary Discharge Instructions: Moisten gauze with Dakin's solution Secondary Dressing: Zetuvit Plus Silicone Border Dressing 7x7(in/in) (Home Health) 1 x Per Day/30 Days Discharge Instructions: Apply silicone border over primary dressing as directed. [name] ([id number]) [id number] Page 5 of 10 Wound #6 - Gluteus Wound Laterality: Right Cleanser: Vashe 5.8 (oz) (Home Health) 1 x Per Day/30 Days Discharge  Instructions: Cleanse the wound with Vashe prior to applying a clean dressing using gauze sponges, not tissue or cotton balls. Peri-Wound Care: Skin Prep (Home Health) 1 x Per Day/30 Days Discharge Instructions: Use skin prep as directed Topical: Santyl Collagenase Ointment, 30 (gm), tube 1 x Per Day/30 Days Prim Dressing: Hydrofera Blue Ready Transfer Foam, 4x5 (in/in) (Generic) 1 x Per Day/30 Days ary Discharge Instructions: Apply to wound bed as instructed Secondary Dressing: Zetuvit Plus Silicone Border Dressing 4x4 (in/in) (Home Health) 1 x Per Day/30 Days Discharge Instructions: Apply silicone border over primary dressing as directed. Electronic Signature(s) Signed: 06/17/2023 5:29:38 PM By: Geralyn Corwin DO Entered By: Geralyn Corwin on 06/17/2023 12:24:30 -------------------------------------------------------------------------------- Problem List Details Patient Name: Date of Service: [name], [name] 06/17/2023 10:15 A M Medical Record Number: [id number] Patient Account Number: [id number] Date of Birth/Sex: Treating RN: [birth year] (62 y.o. M)  [name] ([id number]) [id number] Page 1 of 10 Visit Report for 06/17/2023 Chief Complaint Document Details Patient Name: Date of Service: [name] 06/17/2023 10:15 A M Medical Record Number: [id number] Patient Account Number: [id number] Date of Birth/Sex: Treating RN: October 18, 1960 (62 y.o. M) Primary Care Provider: [facility] Other Clinician: Referring Provider: Treating Provider/Extender: [name] in Treatment: 4 Information Obtained from: Patient Chief Complaint 05/18/2023; wounds to the left and right buttocks and upper back Electronic Signature(s) Signed: 06/17/2023 5:29:38 PM By: Geralyn Corwin DO Entered By: Geralyn Corwin on 06/17/2023 12:08:36 -------------------------------------------------------------------------------- Debridement Details Patient Name: Date of Service: [name], [name] 06/17/2023 10:15 A M Medical Record Number: [id number] Patient Account Number: [id number] Date of Birth/Sex: Treating RN: [birth year] (62 y.o. [name] Primary Care Provider: [facility] Other Clinician: Referring Provider: Treating Provider/Extender: [name] in Treatment: 4 Debridement Performed for Assessment: Wound #3 Left Gluteus Performed By: Physician Geralyn Corwin, DO Debridement Type: Debridement Level of Consciousness (Pre-procedure): Awake and Alert Pre-procedure Verification/Time Out Yes - 11:00 Taken: Start Time: 11:00 Pain Control: Lidocaine 4% T opical Solution Percent of Wound Bed Debrided: 100% T Area Debrided (cm): otal 17.6 Tissue and other material debrided: Viable, Non-Viable, Slough, Subcutaneous, Slough, Other: Necrotic Tissue Level: Skin/Subcutaneous Tissue Debridement Description: Excisional Instrument: Forceps, Scissors Bleeding: Minimum Hemostasis Achieved: Pressure End Time: 11:03 Procedural Pain: 0 Post Procedural Pain: 0 Response to Treatment:  Procedure was tolerated well Level of Consciousness (Post- Awake and Alert procedure): Post Debridement Measurements of Total Wound Length: (cm) 5.9 Stage: Category/Stage IV Width: (cm) 3.8 Depth: (cm) 2.4 Volume: (cm) 42.261 Character of Wound/Ulcer Post Debridement: Improved [name] ([id number]) [id number] Page 2 of 10 Post Procedure Diagnosis Same as Pre-procedure Notes Scribed for Dr. Mikey Bussing by J.Scotton Electronic Signature(s) Signed: 06/17/2023 3:50:25 PM By: Karie Schwalbe RN Signed: 06/17/2023 5:29:38 PM By: Geralyn Corwin DO Entered By: Karie Schwalbe on 06/17/2023 11:23:58 -------------------------------------------------------------------------------- Debridement Details Patient Name: Date of Service: [name], [name] 06/17/2023 10:15 A M Medical Record Number: [id number] Patient Account Number: [id number] Date of Birth/Sex: Treating RN: 09/24/1961 (62 y.o. [name] Primary Care Provider: [facility] Other Clinician: Referring Provider: Treating Provider/Extender: [name] in Treatment: 4 Debridement Performed for Assessment: Wound #4 Right Gluteal fold Performed By: Physician Geralyn Corwin, DO Debridement Type: Debridement Level of Consciousness (Pre-procedure): Awake and Alert Pre-procedure Verification/Time Out Yes - 11:00 Taken: Start Time: 11:00 Pain Control: Lidocaine 4% T opical Solution Percent of Wound Bed Debrided: 100% T Area Debrided (cm): otal 35.4 Tissue and other material debrided: Viable, Non-Viable, Slough, Subcutaneous, Slough, Other: Necrotic Tissue Level: Skin/Subcutaneous Tissue Debridement Description: Excisional Instrument: Forceps, Scissors Bleeding: Minimum Hemostasis Achieved: Pressure End Time: 11:03 Procedural Pain: 0 Post Procedural Pain: 0 Response to Treatment: Procedure was tolerated well Level of Consciousness (Post- Awake and  Alert procedure): Post Debridement Measurements of Total Wound Length: (cm) 5.5 Stage: Category/Stage IV Width: (cm) 8.2 Depth: (cm) 2.6 Volume: (cm) 92.096 Character of Wound/Ulcer Post Debridement: Improved Post Procedure Diagnosis Same as Pre-procedure Notes Scribed for Dr. Mikey Bussing by J.Scotton Electronic Signature(s) Signed: 06/17/2023 3:50:25 PM By: Karie Schwalbe RN Signed: 06/17/2023 5:29:38 PM By: Geralyn Corwin DO Entered By: Karie Schwalbe on 06/17/2023 11:24:10 Belding, Sharad F ([id number]) [id number] Page 3 of 10 -------------------------------------------------------------------------------- HPI Details Patient Name: Date of Service: [name] 06/17/2023 10:15 A M Medical Record Number: [id number] Patient Account Number: [id number] Date  of Birth/Sex: Treating RN: 1961/07/17 (62 y.o. M) Primary Care Provider: [facility] Other Clinician: Referring Provider: Treating Provider/Extender: [name] in Treatment: 4 History of Present Illness HPI Description: 05/18/2023 Mr. Kru Novick is a 62 year old male with a past medical history of multiple sclerosis and neurogenic bladder with chronic indwelling Foley catheter that presents to the clinic for a 1 year history of nonhealing ulcers to the bilateral ischium As well as upper back. He has an air mattress. He spends about 50% of the time in the bed and 50% of the time in the wheelchair. He does not have a Roho cushion. He has been using Medihoney and Santyl to the wound beds. He currently denies systemic signs of infection. Of note he was hospitalized on 2 occasions in January 2024 for trigeminal neuralgia. He has lost a significant amount of weight due to this and his oral intake has declined. He was evaluated for PEG tube but was not a candidate. 8/19; patient presents for follow-up. Patient has home health. He has been using Dakin's wet-to-dry dressings to the  ischial wounds and Santyl to the back wound. Patient was recently hospitalized for UTI and received IV Zosyn for 3 days followed by fosfomycin. He completed 6 days of antibiotics. He currently denies systemic signs of infection. He also had a portable pelvic x-ray that did not show any obvious erosive changes. Wife is presentAnd she states she is trying to aggressively offload the wound beds by repositioning him on his sides. Unfortunately he has developed two wounds 1 to the right buttocks and 1 to the midline back. 9/5; patient presents for follow-up. Wife is present. She has been using Dakin's wet-to-dry dressings to the ischial wounds and Santyl to the back wounds and right buttocks wound. Overall wounds have improved in appearance and size since last clinic visit. Electronic Signature(s) Signed: 06/17/2023 5:29:38 PM By: Geralyn Corwin DO Entered By: Geralyn Corwin on 06/17/2023 12:22:56 -------------------------------------------------------------------------------- Physical Exam Details Patient Name: Date of Service: [name], [name] 06/17/2023 10:15 A M Medical Record Number: [id number] Patient Account Number: [id number] Date of Birth/Sex: Treating RN: [birth year] (62 y.o. M) Primary Care Provider: [facility] Other Clinician: Referring Provider: Treating Provider/Extender: [name] in Treatment: 4 Constitutional respirations regular, non-labored and within target range for patient.Marland Kitchen Psychiatric pleasant and cooperative. Notes Extensive wounds to the ischium's bilaterally That does not probe to bone. Granulation tissue with some nonviable tissue present. T the upper back there is an o open wound with nonviable tissue and granulation tissue present. Just below this There is epithelization to the previous skin breakdown. T the right Glute there o is an abrasion noted. No surrounding signs of soft tissue infection including increased warmth, erythema or  purulent drainage. Electronic Signature(s) Signed: 06/17/2023 5:29:38 PM By: Geralyn Corwin DO Entered By: Geralyn Corwin on 06/17/2023 12:24:19 Falck, Abrahan F ([id number]) [id number] Page 4 of 10 -------------------------------------------------------------------------------- Physician Orders Details Patient Name: Date of Service: [name] 06/17/2023 10:15 A M Medical Record Number: [id number] Patient Account Number: [id number] Date of Birth/Sex: Treating RN: [birth year] (62 y.o. [name] Primary Care Provider: [facility] Other Clinician: Referring Provider: Treating Provider/Extender: [name] in Treatment: 4 Verbal / Phone Orders: No Diagnosis Coding Follow-up Appointments ppointment in 2 weeks. - HOYER extra time 60 minutes Dr. Mikey Bussing Thursday Return A room 9 Return appointment in 1 month. - Pleas ask front desk for appointment Anesthetic (In clinic) Topical Lidocaine 4%  applied to wound bed Bathing/ Shower/ Hygiene Other Bathing/Shower/Hygiene Orders/Instructions: - May bathe with soap and water Off-Loading Turn and reposition every 2 hours Other: - only an hour up to eat and back to bed. Turn every 2 hours. Home Health New wound care orders this week; continue Home Health for wound care. May utilize formulary equivalent dressing for wound treatment orders unless otherwise specified. - Dakin's solution wet to dry to wounds #4 and #6. Santyl and hydrofera blue bordered foam to both back wounds. Other Home Health Orders/Instructions: - Please see the patient at least once a week OR please see the patient's spouse regarding wound supplies ( to order supplies for the patient) [facility] Wound Treatment Wound #2 - Back Wound Laterality: Medial Cleanser: Vashe 5.8 (oz) (Home Health) 1 x Per Day/30 Days Discharge Instructions: Cleanse the wound with Vashe prior to applying a clean dressing using  gauze sponges, not tissue or cotton balls. Peri-Wound Care: Skin Prep (Home Health) 1 x Per Day/30 Days Discharge Instructions: Use skin prep as directed Topical: Santyl Collagenase Ointment, 30 (gm), tube 1 x Per Day/30 Days Prim Dressing: Hydrofera Blue Ready Transfer Foam, 4x5 (in/in) (Generic) 1 x Per Day/30 Days ary Discharge Instructions: Apply to wound bed as instructed Secondary Dressing: Zetuvit Plus Silicone Border Dressing 4x4 (in/in) (Home Health) 1 x Per Day/30 Days Discharge Instructions: Apply silicone border over primary dressing as directed. Wound #3 - Gluteus Wound Laterality: Left Cleanser: Vashe 5.8 (oz) (Home Health) (Generic) 1 x Per Day/30 Days Discharge Instructions: Cleanse the wound with Vashe prior to applying a clean dressing using gauze sponges, not tissue or cotton balls. Prim Dressing: Dakin's Solution 0.25%, 16 (oz) (Home Health) 1 x Per Day/30 Days ary Discharge Instructions: Moisten gauze with Dakin's solution Secondary Dressing: Zetuvit Plus Silicone Border Dressing 7x7(in/in) (Home Health) 1 x Per Day/30 Days Discharge Instructions: Apply silicone border over primary dressing as directed. Wound #4 - Gluteal fold Wound Laterality: Right Cleanser: Vashe 5.8 (oz) (Home Health) (Generic) 1 x Per Day/30 Days Discharge Instructions: Cleanse the wound with Vashe prior to applying a clean dressing using gauze sponges, not tissue or cotton balls. Prim Dressing: Dakin's Solution 0.25%, 16 (oz) (Home Health) 1 x Per Day/30 Days ary Discharge Instructions: Moisten gauze with Dakin's solution Secondary Dressing: Zetuvit Plus Silicone Border Dressing 7x7(in/in) (Home Health) 1 x Per Day/30 Days Discharge Instructions: Apply silicone border over primary dressing as directed. [name] ([id number]) [id number] Page 5 of 10 Wound #6 - Gluteus Wound Laterality: Right Cleanser: Vashe 5.8 (oz) (Home Health) 1 x Per Day/30 Days Discharge  Instructions: Cleanse the wound with Vashe prior to applying a clean dressing using gauze sponges, not tissue or cotton balls. Peri-Wound Care: Skin Prep (Home Health) 1 x Per Day/30 Days Discharge Instructions: Use skin prep as directed Topical: Santyl Collagenase Ointment, 30 (gm), tube 1 x Per Day/30 Days Prim Dressing: Hydrofera Blue Ready Transfer Foam, 4x5 (in/in) (Generic) 1 x Per Day/30 Days ary Discharge Instructions: Apply to wound bed as instructed Secondary Dressing: Zetuvit Plus Silicone Border Dressing 4x4 (in/in) (Home Health) 1 x Per Day/30 Days Discharge Instructions: Apply silicone border over primary dressing as directed. Electronic Signature(s) Signed: 06/17/2023 5:29:38 PM By: Geralyn Corwin DO Entered By: Geralyn Corwin on 06/17/2023 12:24:30 -------------------------------------------------------------------------------- Problem List Details Patient Name: Date of Service: [name], [name] 06/17/2023 10:15 A M Medical Record Number: [id number] Patient Account Number: [id number] Date of Birth/Sex: Treating RN: [birth year] (62 y.o. M)  applied to wound bed Bathing/ Shower/ Hygiene Other Bathing/Shower/Hygiene Orders/Instructions: - May bathe with soap and water Off-Loading Turn and reposition every 2 hours Other: - only an hour up to eat and back to bed. Turn every 2 hours. Home Health New wound care orders this week; continue Home Health for wound care. May utilize formulary equivalent dressing for wound treatment orders unless otherwise specified. - Dakin's solution wet to dry to wounds #4 and #6. Santyl and hydrofera blue bordered foam to both back wounds. Other Home Health Orders/Instructions: - Please see the patient at least once a week OR please see the patient's spouse regarding wound supplies ( to order supplies for the patient) [facility] Wound Treatment Wound #2 - Back Wound Laterality: Medial Cleanser: Vashe 5.8 (oz) (Home Health) 1 x Per Day/30 Days Discharge Instructions: Cleanse the wound with Vashe prior to applying a clean dressing using  gauze sponges, not tissue or cotton balls. Peri-Wound Care: Skin Prep (Home Health) 1 x Per Day/30 Days Discharge Instructions: Use skin prep as directed Topical: Santyl Collagenase Ointment, 30 (gm), tube 1 x Per Day/30 Days Prim Dressing: Hydrofera Blue Ready Transfer Foam, 4x5 (in/in) (Generic) 1 x Per Day/30 Days ary Discharge Instructions: Apply to wound bed as instructed Secondary Dressing: Zetuvit Plus Silicone Border Dressing 4x4 (in/in) (Home Health) 1 x Per Day/30 Days Discharge Instructions: Apply silicone border over primary dressing as directed. Wound #3 - Gluteus Wound Laterality: Left Cleanser: Vashe 5.8 (oz) (Home Health) (Generic) 1 x Per Day/30 Days Discharge Instructions: Cleanse the wound with Vashe prior to applying a clean dressing using gauze sponges, not tissue or cotton balls. Prim Dressing: Dakin's Solution 0.25%, 16 (oz) (Home Health) 1 x Per Day/30 Days ary Discharge Instructions: Moisten gauze with Dakin's solution Secondary Dressing: Zetuvit Plus Silicone Border Dressing 7x7(in/in) (Home Health) 1 x Per Day/30 Days Discharge Instructions: Apply silicone border over primary dressing as directed. Wound #4 - Gluteal fold Wound Laterality: Right Cleanser: Vashe 5.8 (oz) (Home Health) (Generic) 1 x Per Day/30 Days Discharge Instructions: Cleanse the wound with Vashe prior to applying a clean dressing using gauze sponges, not tissue or cotton balls. Prim Dressing: Dakin's Solution 0.25%, 16 (oz) (Home Health) 1 x Per Day/30 Days ary Discharge Instructions: Moisten gauze with Dakin's solution Secondary Dressing: Zetuvit Plus Silicone Border Dressing 7x7(in/in) (Home Health) 1 x Per Day/30 Days Discharge Instructions: Apply silicone border over primary dressing as directed. [name] ([id number]) [id number] Page 5 of 10 Wound #6 - Gluteus Wound Laterality: Right Cleanser: Vashe 5.8 (oz) (Home Health) 1 x Per Day/30 Days Discharge  Instructions: Cleanse the wound with Vashe prior to applying a clean dressing using gauze sponges, not tissue or cotton balls. Peri-Wound Care: Skin Prep (Home Health) 1 x Per Day/30 Days Discharge Instructions: Use skin prep as directed Topical: Santyl Collagenase Ointment, 30 (gm), tube 1 x Per Day/30 Days Prim Dressing: Hydrofera Blue Ready Transfer Foam, 4x5 (in/in) (Generic) 1 x Per Day/30 Days ary Discharge Instructions: Apply to wound bed as instructed Secondary Dressing: Zetuvit Plus Silicone Border Dressing 4x4 (in/in) (Home Health) 1 x Per Day/30 Days Discharge Instructions: Apply silicone border over primary dressing as directed. Electronic Signature(s) Signed: 06/17/2023 5:29:38 PM By: Geralyn Corwin DO Entered By: Geralyn Corwin on 06/17/2023 12:24:30 -------------------------------------------------------------------------------- Problem List Details Patient Name: Date of Service: [name], [name] 06/17/2023 10:15 A M Medical Record Number: [id number] Patient Account Number: [id number] Date of Birth/Sex: Treating RN: [birth year] (62 y.o. M)  of Birth/Sex: Treating RN: 1961/07/17 (62 y.o. M) Primary Care Provider: [facility] Other Clinician: Referring Provider: Treating Provider/Extender: [name] in Treatment: 4 History of Present Illness HPI Description: 05/18/2023 Mr. Kru Novick is a 62 year old male with a past medical history of multiple sclerosis and neurogenic bladder with chronic indwelling Foley catheter that presents to the clinic for a 1 year history of nonhealing ulcers to the bilateral ischium As well as upper back. He has an air mattress. He spends about 50% of the time in the bed and 50% of the time in the wheelchair. He does not have a Roho cushion. He has been using Medihoney and Santyl to the wound beds. He currently denies systemic signs of infection. Of note he was hospitalized on 2 occasions in January 2024 for trigeminal neuralgia. He has lost a significant amount of weight due to this and his oral intake has declined. He was evaluated for PEG tube but was not a candidate. 8/19; patient presents for follow-up. Patient has home health. He has been using Dakin's wet-to-dry dressings to the  ischial wounds and Santyl to the back wound. Patient was recently hospitalized for UTI and received IV Zosyn for 3 days followed by fosfomycin. He completed 6 days of antibiotics. He currently denies systemic signs of infection. He also had a portable pelvic x-ray that did not show any obvious erosive changes. Wife is presentAnd she states she is trying to aggressively offload the wound beds by repositioning him on his sides. Unfortunately he has developed two wounds 1 to the right buttocks and 1 to the midline back. 9/5; patient presents for follow-up. Wife is present. She has been using Dakin's wet-to-dry dressings to the ischial wounds and Santyl to the back wounds and right buttocks wound. Overall wounds have improved in appearance and size since last clinic visit. Electronic Signature(s) Signed: 06/17/2023 5:29:38 PM By: Geralyn Corwin DO Entered By: Geralyn Corwin on 06/17/2023 12:22:56 -------------------------------------------------------------------------------- Physical Exam Details Patient Name: Date of Service: [name], [name] 06/17/2023 10:15 A M Medical Record Number: [id number] Patient Account Number: [id number] Date of Birth/Sex: Treating RN: [birth year] (62 y.o. M) Primary Care Provider: [facility] Other Clinician: Referring Provider: Treating Provider/Extender: [name] in Treatment: 4 Constitutional respirations regular, non-labored and within target range for patient.Marland Kitchen Psychiatric pleasant and cooperative. Notes Extensive wounds to the ischium's bilaterally That does not probe to bone. Granulation tissue with some nonviable tissue present. T the upper back there is an o open wound with nonviable tissue and granulation tissue present. Just below this There is epithelization to the previous skin breakdown. T the right Glute there o is an abrasion noted. No surrounding signs of soft tissue infection including increased warmth, erythema or  purulent drainage. Electronic Signature(s) Signed: 06/17/2023 5:29:38 PM By: Geralyn Corwin DO Entered By: Geralyn Corwin on 06/17/2023 12:24:19 Falck, Abrahan F ([id number]) [id number] Page 4 of 10 -------------------------------------------------------------------------------- Physician Orders Details Patient Name: Date of Service: [name] 06/17/2023 10:15 A M Medical Record Number: [id number] Patient Account Number: [id number] Date of Birth/Sex: Treating RN: [birth year] (62 y.o. [name] Primary Care Provider: [facility], Kathryne Sharper Other Clinician: Referring Provider: Treating Provider/Extender: [name] in Treatment: 4 Verbal / Phone Orders: No Diagnosis Coding Follow-up Appointments ppointment in 2 weeks. - HOYER extra time 60 minutes Dr. Mikey Bussing Thursday Return A room 9 Return appointment in 1 month. - Pleas ask front desk for appointment Anesthetic (In clinic) Topical Lidocaine 4%  applied to wound bed Bathing/ Shower/ Hygiene Other Bathing/Shower/Hygiene Orders/Instructions: - May bathe with soap and water Off-Loading Turn and reposition every 2 hours Other: - only an hour up to eat and back to bed. Turn every 2 hours. Home Health New wound care orders this week; continue Home Health for wound care. May utilize formulary equivalent dressing for wound treatment orders unless otherwise specified. - Dakin's solution wet to dry to wounds #4 and #6. Santyl and hydrofera blue bordered foam to both back wounds. Other Home Health Orders/Instructions: - Please see the patient at least once a week OR please see the patient's spouse regarding wound supplies ( to order supplies for the patient) [facility] Wound Treatment Wound #2 - Back Wound Laterality: Medial Cleanser: Vashe 5.8 (oz) (Home Health) 1 x Per Day/30 Days Discharge Instructions: Cleanse the wound with Vashe prior to applying a clean dressing using  gauze sponges, not tissue or cotton balls. Peri-Wound Care: Skin Prep (Home Health) 1 x Per Day/30 Days Discharge Instructions: Use skin prep as directed Topical: Santyl Collagenase Ointment, 30 (gm), tube 1 x Per Day/30 Days Prim Dressing: Hydrofera Blue Ready Transfer Foam, 4x5 (in/in) (Generic) 1 x Per Day/30 Days ary Discharge Instructions: Apply to wound bed as instructed Secondary Dressing: Zetuvit Plus Silicone Border Dressing 4x4 (in/in) (Home Health) 1 x Per Day/30 Days Discharge Instructions: Apply silicone border over primary dressing as directed. Wound #3 - Gluteus Wound Laterality: Left Cleanser: Vashe 5.8 (oz) (Home Health) (Generic) 1 x Per Day/30 Days Discharge Instructions: Cleanse the wound with Vashe prior to applying a clean dressing using gauze sponges, not tissue or cotton balls. Prim Dressing: Dakin's Solution 0.25%, 16 (oz) (Home Health) 1 x Per Day/30 Days ary Discharge Instructions: Moisten gauze with Dakin's solution Secondary Dressing: Zetuvit Plus Silicone Border Dressing 7x7(in/in) (Home Health) 1 x Per Day/30 Days Discharge Instructions: Apply silicone border over primary dressing as directed. Wound #4 - Gluteal fold Wound Laterality: Right Cleanser: Vashe 5.8 (oz) (Home Health) (Generic) 1 x Per Day/30 Days Discharge Instructions: Cleanse the wound with Vashe prior to applying a clean dressing using gauze sponges, not tissue or cotton balls. Prim Dressing: Dakin's Solution 0.25%, 16 (oz) (Home Health) 1 x Per Day/30 Days ary Discharge Instructions: Moisten gauze with Dakin's solution Secondary Dressing: Zetuvit Plus Silicone Border Dressing 7x7(in/in) (Home Health) 1 x Per Day/30 Days Discharge Instructions: Apply silicone border over primary dressing as directed. [name] ([id number]) [id number] Page 5 of 10 Wound #6 - Gluteus Wound Laterality: Right Cleanser: Vashe 5.8 (oz) (Home Health) 1 x Per Day/30 Days Discharge  Instructions: Cleanse the wound with Vashe prior to applying a clean dressing using gauze sponges, not tissue or cotton balls. Peri-Wound Care: Skin Prep (Home Health) 1 x Per Day/30 Days Discharge Instructions: Use skin prep as directed Topical: Santyl Collagenase Ointment, 30 (gm), tube 1 x Per Day/30 Days Prim Dressing: Hydrofera Blue Ready Transfer Foam, 4x5 (in/in) (Generic) 1 x Per Day/30 Days ary Discharge Instructions: Apply to wound bed as instructed Secondary Dressing: Zetuvit Plus Silicone Border Dressing 4x4 (in/in) (Home Health) 1 x Per Day/30 Days Discharge Instructions: Apply silicone border over primary dressing as directed. Electronic Signature(s) Signed: 06/17/2023 5:29:38 PM By: Geralyn Corwin DO Entered By: Geralyn Corwin on 06/17/2023 12:24:30 -------------------------------------------------------------------------------- Problem List Details Patient Name: Date of Service: [name], [name] 06/17/2023 10:15 A M Medical Record Number: [id number] Patient Account Number: [id number] Date of Birth/Sex: Treating RN: [birth year] (62 y.o. M)  applied to wound bed Bathing/ Shower/ Hygiene Other Bathing/Shower/Hygiene Orders/Instructions: - May bathe with soap and water Off-Loading Turn and reposition every 2 hours Other: - only an hour up to eat and back to bed. Turn every 2 hours. Home Health New wound care orders this week; continue Home Health for wound care. May utilize formulary equivalent dressing for wound treatment orders unless otherwise specified. - Dakin's solution wet to dry to wounds #4 and #6. Santyl and hydrofera blue bordered foam to both back wounds. Other Home Health Orders/Instructions: - Please see the patient at least once a week OR please see the patient's spouse regarding wound supplies ( to order supplies for the patient) [facility] Wound Treatment Wound #2 - Back Wound Laterality: Medial Cleanser: Vashe 5.8 (oz) (Home Health) 1 x Per Day/30 Days Discharge Instructions: Cleanse the wound with Vashe prior to applying a clean dressing using  gauze sponges, not tissue or cotton balls. Peri-Wound Care: Skin Prep (Home Health) 1 x Per Day/30 Days Discharge Instructions: Use skin prep as directed Topical: Santyl Collagenase Ointment, 30 (gm), tube 1 x Per Day/30 Days Prim Dressing: Hydrofera Blue Ready Transfer Foam, 4x5 (in/in) (Generic) 1 x Per Day/30 Days ary Discharge Instructions: Apply to wound bed as instructed Secondary Dressing: Zetuvit Plus Silicone Border Dressing 4x4 (in/in) (Home Health) 1 x Per Day/30 Days Discharge Instructions: Apply silicone border over primary dressing as directed. Wound #3 - Gluteus Wound Laterality: Left Cleanser: Vashe 5.8 (oz) (Home Health) (Generic) 1 x Per Day/30 Days Discharge Instructions: Cleanse the wound with Vashe prior to applying a clean dressing using gauze sponges, not tissue or cotton balls. Prim Dressing: Dakin's Solution 0.25%, 16 (oz) (Home Health) 1 x Per Day/30 Days ary Discharge Instructions: Moisten gauze with Dakin's solution Secondary Dressing: Zetuvit Plus Silicone Border Dressing 7x7(in/in) (Home Health) 1 x Per Day/30 Days Discharge Instructions: Apply silicone border over primary dressing as directed. Wound #4 - Gluteal fold Wound Laterality: Right Cleanser: Vashe 5.8 (oz) (Home Health) (Generic) 1 x Per Day/30 Days Discharge Instructions: Cleanse the wound with Vashe prior to applying a clean dressing using gauze sponges, not tissue or cotton balls. Prim Dressing: Dakin's Solution 0.25%, 16 (oz) (Home Health) 1 x Per Day/30 Days ary Discharge Instructions: Moisten gauze with Dakin's solution Secondary Dressing: Zetuvit Plus Silicone Border Dressing 7x7(in/in) (Home Health) 1 x Per Day/30 Days Discharge Instructions: Apply silicone border over primary dressing as directed. [name] ([id number]) [id number] Page 5 of 10 Wound #6 - Gluteus Wound Laterality: Right Cleanser: Vashe 5.8 (oz) (Home Health) 1 x Per Day/30 Days Discharge  Instructions: Cleanse the wound with Vashe prior to applying a clean dressing using gauze sponges, not tissue or cotton balls. Peri-Wound Care: Skin Prep (Home Health) 1 x Per Day/30 Days Discharge Instructions: Use skin prep as directed Topical: Santyl Collagenase Ointment, 30 (gm), tube 1 x Per Day/30 Days Prim Dressing: Hydrofera Blue Ready Transfer Foam, 4x5 (in/in) (Generic) 1 x Per Day/30 Days ary Discharge Instructions: Apply to wound bed as instructed Secondary Dressing: Zetuvit Plus Silicone Border Dressing 4x4 (in/in) (Home Health) 1 x Per Day/30 Days Discharge Instructions: Apply silicone border over primary dressing as directed. Electronic Signature(s) Signed: 06/17/2023 5:29:38 PM By: Geralyn Corwin DO Entered By: Geralyn Corwin on 06/17/2023 12:24:30 -------------------------------------------------------------------------------- Problem List Details Patient Name: Date of Service: [name], [name] 06/17/2023 10:15 A M Medical Record Number: [id number] Patient Account Number: [id number] Date of Birth/Sex: Treating RN: [birth year] (62 y.o. M)

## 2023-06-23 NOTE — Progress Notes (Signed)
[name] ([id number]) [id number] Page 1 of 13 Visit Report for 06/17/2023 Arrival Information Details Patient Name: Date of Service: [name]. 06/17/2023 10:15 Mark M Medical Record Number: [id number] Patient Account Number: [id number] Date of Birth/Sex: Treating RN: 1961-08-08 (62 y.o. [name] Primary Care Kaleb Sek: [facility] Other Clinician: Referring Thaniel Coluccio: Treating Leevi Cullars/Extender: [name] in Treatment: 4 Visit Information History Since Last Visit Added or deleted any medications: No Patient Arrived: Wheel Chair Any new allergies or adverse reactions: No Arrival Time: 10:06 Had Mark fall or experienced change in No Accompanied By: wife activities of daily living that may affect Transfer Assistance: Michiel Sites Lift risk of falls: Patient Identification Verified: Yes Signs or symptoms of abuse/neglect since last visito No Patient Requires Transmission-Based Precautions: No Hospitalized since last visit: No Patient Has Alerts: No Implantable device outside of the clinic excluding No cellular tissue based products placed in the center since last visit: Has Dressing in Place as Prescribed: Yes Pain Present Now: Yes Electronic Signature(s) Signed: 06/17/2023 3:50:25 PM By: Karie Schwalbe RN Entered By: Karie Schwalbe on 06/17/2023 10:06:44 -------------------------------------------------------------------------------- Encounter Discharge Information Details Patient Name: Date of Service: [name], [name]. 06/17/2023 10:15 Mark M Medical Record Number: [id number] Patient Account Number: [id number] Date of Birth/Sex: Treating RN: Oct 20, 1960 (62 y.o. [name] Primary Care Iris Hairston: [facility] Other Clinician: Referring Taron Conrey: Treating Dariona Postma/Extender: [name] in Treatment: 4 Encounter Discharge Information Items Post Procedure Vitals Discharge Condition:  Stable Temperature ([name]): 98.6 Ambulatory Status: Wheelchair Pulse (bpm): 73 Discharge Destination: Home Respiratory Rate (breaths/min): 16 Transportation: Private Auto Blood Pressure (mmHg): 96/65 Accompanied By: spouse Schedule Follow-up Appointment: Yes Clinical Summary of Care: Patient Declined Electronic Signature(s) Signed: 06/17/2023 3:50:25 PM By: Karie Schwalbe RN Entered By: Karie Schwalbe on 06/17/2023 15:47:52 [name], [name] ([id number]) [id number] Page 2 of 13 -------------------------------------------------------------------------------- Lower Extremity Assessment Details Patient Name: Date of Service: [name] 06/17/2023 10:15 Mark M Medical Record Number: [id number] Patient Account Number: [id number] Date of Birth/Sex: Treating RN: 1961-06-19 (62 y.o. [name] Primary Care Xolani Degracia: [facility] Other Clinician: Referring Alexes Menchaca: Treating Altariq Goodall/Extender: [name] in Treatment: 4 Electronic Signature(s) Signed: 06/17/2023 3:50:25 PM By: Karie Schwalbe RN Entered By: Karie Schwalbe on 06/17/2023 10:20:06 -------------------------------------------------------------------------------- Multi Wound Chart Details Patient Name: Date of Service: [name], [name]. 06/17/2023 10:15 Mark M Medical Record Number: [id number] Patient Account Number: [id number] Date of Birth/Sex: Treating RN: [birth year] (62 y.o. M) Primary Care Lesia Monica: [facility] Other Clinician: Referring Jamisen Hawes: Treating Shellia Hartl/Extender: [name] in Treatment: 4 Vital Signs Height(in): Pulse(bpm): 73 Weight(lbs): Blood Pressure(mmHg): 96/65 Body Mass Index(BMI): Temperature([name]): 98.6 Respiratory Rate(breaths/min): 16 [2:Photos:] Medial Back Left Gluteus Right Gluteal fold Wound Location: Gradually Appeared Gradually Appeared Pressure Injury Wounding Event: Pressure Ulcer Pressure Ulcer Pressure  Ulcer Primary Etiology: Asthma Asthma Asthma Comorbid History: 05/14/2022 05/14/2022 05/14/2022 Date Acquired: 4 4 4  Weeks of Treatment: Open Open Open Wound Status: No No No Wound Recurrence: 2x1x0.1 5.9x3.8x2.4 5.5x8.2x2.6 Measurements L x W x D (cm) 1.571 17.609 35.421 Mark (cm) : rea 0.157 42.261 92.096 Volume (cm) : 27.30% -35.90% -52.80% % Reduction in Mark rea: 63.70% 6.80% -13.50% % Reduction in Volume: 12 11 Position 1 (o'clock): 6 5.3 Maximum Distance 1 (cm): 1 Position 2 (o'clock): 5.7 Maximum Distance 2 (cm): 6 9 Starting Position 1 (o'clock): 8 2 Ending Position 1 (o'clock): 1.4 3.7 Maximum Distance 1 (cm): No Yes  Yes Tunneling: No Yes Yes UnderminingRUBENS, [name] ([id number]) [id number] Page 3 of 13 Category/Stage III Category/Stage IV Category/Stage IV Classification: Medium Medium Medium Exudate Mark mount: Serosanguineous Serosanguineous Serous Exudate Type: red, brown red, brown amber Exudate Color: Distinct, outline attached Distinct, outline attached Distinct, outline attached Wound Margin: Small (1-33%) Medium (34-66%) Medium (34-66%) Granulation Mark mount: Red Red, Pink Red, Pink, Friable Granulation Quality: Large (67-100%) Medium (34-66%) Small (1-33%) Necrotic Mark mount: Fat Layer (Subcutaneous Tissue): Yes Fat Layer (Subcutaneous Tissue): Yes Fat Layer (Subcutaneous Tissue): Yes Exposed Structures: Fascia: No Muscle: Yes Muscle: Yes Tendon: No Bone: Yes Bone: Yes Muscle: No Fascia: No Joint: No Tendon: No Bone: No Joint: No Small (1-33%) N/Mark N/Mark Epithelialization: N/Mark Debridement - Excisional Debridement - Excisional Debridement: Pre-procedure Verification/Time Out N/Mark 11:00 11:00 Taken: N/Mark Lidocaine 4% Topical Solution Lidocaine 4% Topical Solution Pain Control: N/Mark Other, Subcutaneous, Slough Other, Subcutaneous, Slough Tissue Debrided: N/Mark Skin/Subcutaneous Tissue Skin/Subcutaneous  Tissue Level: N/Mark 17.6 35.4 Debridement Mark (sq cm): rea N/Mark Forceps, Scissors Forceps, Scissors Instrument: N/Mark Minimum Minimum Bleeding: N/Mark Pressure Pressure Hemostasis Mark chieved: N/Mark 0 0 Procedural Pain: N/Mark 0 0 Post Procedural Pain: N/Mark Procedure was tolerated well Procedure was tolerated well Debridement Treatment Response: N/Mark 5.9x3.8x2.4 5.5x8.2x2.6 Post Debridement Measurements L x W x D (cm) N/Mark 42.261 92.096 Post Debridement Volume: (cm) N/Mark Category/Stage IV Category/Stage IV Post Debridement Stage: Excoriation: No Scarring: Yes No Abnormalities Noted Periwound Skin Texture: Induration: No Callus: No Crepitus: No Rash: No Scarring: No Maceration: No Maceration: No No Abnormalities Noted Periwound Skin Moisture: Dry/Scaly: No Dry/Scaly: No Atrophie Blanche: No Hemosiderin Staining: Yes No Abnormalities Noted Periwound Skin Color: Cyanosis: No Ecchymosis: No Erythema: No Hemosiderin Staining: No Mottled: No Pallor: No Rubor: No No Abnormality No Abnormality N/Mark Temperature: N/Mark Debridement Debridement Procedures Performed: Wound Number: 5 6 N/Mark Photos: N/Mark Distal, Midline Back Right Gluteus N/Mark Wound Location: Pressure Injury Pressure Injury N/Mark Wounding Event: Pressure Ulcer Pressure Ulcer N/Mark Primary Etiology: Asthma Asthma N/Mark Comorbid History: 05/13/2023 05/13/2023 N/Mark Date Acquired: 2 2 N/Mark Weeks of Treatment: Healed - Epithelialized Open N/Mark Wound Status: No No N/Mark Wound Recurrence: 0x0x0 0.6x2.1x0.2 N/Mark Measurements L x W x D (cm) 0 0.99 N/Mark Mark (cm) : rea 0 0.198 N/Mark Volume (cm) : 100.00% 73.70% N/Mark % Reduction in Mark rea: 100.00% 47.50% N/Mark % Reduction in Volume: No No N/Mark Tunneling: No No N/Mark Undermining: Category/Stage II Category/Stage II N/Mark Classification: None Present Medium N/Mark Exudate Mark mount: N/Mark Serosanguineous N/Mark Exudate Type: N/Mark red, brown N/Mark Exudate Color: N/Mark Distinct, outline attached N/Mark Wound  Margin: None Present (0%) Medium (34-66%) N/Mark Granulation Mark mount: N/Mark Pink N/Mark Granulation Quality: None Present (0%) Medium (34-66%) N/Mark Necrotic Mark mount: N/Mark Fat Layer (Subcutaneous Tissue): Yes N/Mark Exposed Structures: [name] ([id number]) [id number] Page 4 of 13 Fascia: No Tendon: No Muscle: No Joint: No Bone: No Large (67-100%) Small (1-33%) N/Mark Epithelialization: N/Mark N/Mark N/Mark Debridement: N/Mark N/Mark N/Mark Pain Control: N/Mark N/Mark N/Mark Tissue Debrided: N/Mark N/Mark N/Mark Level: N/Mark N/Mark N/Mark Debridement Mark (sq cm): rea N/Mark N/Mark N/Mark Instrument: N/Mark N/Mark N/Mark Bleeding: N/Mark N/Mark N/Mark Hemostasis Mark chieved: N/Mark N/Mark N/Mark Procedural Pain: N/Mark N/Mark N/Mark Post Procedural Pain: N/Mark N/Mark N/Mark Debridement Treatment Response: N/Mark N/Mark N/Mark Post Debridement Measurements L x W x D (cm) N/Mark N/Mark N/Mark Post Debridement Volume: (cm) N/Mark N/Mark N/Mark Post Debridement Stage: Excoriation: No Scarring: Yes N/Mark Periwound Skin Texture: Induration: No Excoriation: No Callus: No Induration:  No Crepitus: No Callus: No Rash: No Crepitus: No Scarring: No Rash: No Maceration: No Maceration: No N/Mark Periwound Skin Moisture: Dry/Scaly: No Dry/Scaly: No Atrophie Blanche: No Atrophie Blanche: No N/Mark Periwound Skin Color: Cyanosis: No Cyanosis: No Ecchymosis: No Ecchymosis: No Erythema: No Erythema: No Hemosiderin Staining: No Hemosiderin Staining: No Mottled: No Mottled: No Pallor: No Pallor: No Rubor: No Rubor: No N/Mark N/Mark N/Mark Temperature: N/Mark N/Mark N/Mark Procedures Performed: Treatment Notes Electronic Signature(s) Signed: 06/17/2023 5:29:38 PM By: Geralyn Corwin DO Entered By: Geralyn Corwin on 06/17/2023 12:08:30 -------------------------------------------------------------------------------- Multi-Disciplinary Care Plan Details Patient Name: Date of Service: [name], [name]. 06/17/2023 10:15 Mark M Medical Record Number: [id number] Patient Account Number:  [id number] Date of Birth/Sex: Treating RN: [birth year] (62 y.o. [name] Primary Care Lorelei Heikkila: [facility] Other Clinician: Referring Chauntelle Azpeitia: Treating Dann Ventress/Extender: [name] in Treatment: 4 Active Inactive Wound/Skin Impairment Nursing Diagnoses: Knowledge deficit related to smoking impact on wound healing Goals: Patient/caregiver will verbalize understanding of skin care regimen Date Initiated: 05/18/2023 Target Resolution Date: 08/12/2023 Goal Status: Active Interventions: Assess patient/caregiver ability to obtain necessary supplies Assess patient/caregiver ability to perform ulcer/skin care regimen upon admission and as needed Lincoln, Romualdo [name] ([id number]) [id number] Page 5 of 13 Assess ulceration(s) every visit Provide education on ulcer and skin care Screen for HBO Treatment Activities: Skin care regimen initiated : 05/18/2023 Topical wound management initiated : 05/18/2023 Notes: Electronic Signature(s) Signed: 06/17/2023 3:50:25 PM By: Karie Schwalbe RN Entered By: Karie Schwalbe on 06/17/2023 15:45:18 -------------------------------------------------------------------------------- Pain Assessment Details Patient Name: Date of Service: [name], [name]. 06/17/2023 10:15 Mark M Medical Record Number: [id number] Patient Account Number: [id number] Date of Birth/Sex: Treating RN: Feb 14, 1961 (62 y.o. [name] Primary Care Kody Brandl: [facility] Other Clinician: Referring Legrande Hao: Treating Tod Abrahamsen/Extender: [name] in Treatment: 4 Active Problems Location of Pain Severity and Description of Pain Patient Has Paino No Site Locations Pain Management and Medication Current Pain Management: Electronic Signature(s) Signed: 06/17/2023 3:50:25 PM By: Karie Schwalbe RN Entered By: Karie Schwalbe on 06/17/2023  10:19:46 -------------------------------------------------------------------------------- Patient/Caregiver Education Details Patient Name: Date of Service: [name], [name] 9/5/2024andnbsp10:15 Mark M Medical Record Number: [id number] Patient Account Number: [id number] JAQUAIL, KLUESNER ([id number]) [id number] Page 6 of 13 Date of Birth/Gender: Treating RN: 15-Aug-1961 (62 y.o. [name] Primary Care Physician: [facility] Other Clinician: Referring Physician: Treating Physician/Extender: [name] in Treatment: 4 Education Assessment Education Provided To: Patient Education Topics Provided Wound/Skin Impairment: Methods: Explain/Verbal Responses: See progress note Electronic Signature(s) Signed: 06/17/2023 3:50:25 PM By: Karie Schwalbe RN Entered By: Karie Schwalbe on 06/17/2023 15:45:30 -------------------------------------------------------------------------------- Wound Assessment Details Patient Name: Date of Service: [name], [name]. 06/17/2023 10:15 Mark M Medical Record Number: [id number] Patient Account Number: [id number] Date of Birth/Sex: Treating RN: 1961/03/15 (62 y.o. M) Primary Care Ralston Venus: [facility] Other Clinician: Referring Jamian Andujo: Treating Malkia Nippert/Extender: [name] in Treatment: 4 Wound Status Wound Number: 2 Primary Etiology: Pressure Ulcer Wound Location: Medial Back Wound Status: Open Wounding Event: Gradually Appeared Comorbid History: Asthma Date Acquired: 05/14/2022 Weeks Of Treatment: 4 Clustered Wound: No Photos Wound Measurements Length: (cm) 2 Width: (cm) 1 Depth: (cm) 0.1 Area: (cm) 1.571 Volume: (cm) 0.157 % Reduction in Area: 27.3% % Reduction in Volume: 63.7% Epithelialization: Small (1-33%) Tunneling: No Undermining: No Wound Description Classification: Category/Stage III Wound Margin: Distinct, outline attached Exudate Amount: Medium Exudate  Type: Serosanguineous Exudate Color: red, brown Pyeatt,  Colum [name] ([id number]) Foul Odor After Cleansing: No Slough/Fibrino Yes [id number] Page 7 of 13 Wound Bed Granulation Amount: Small (1-33%) Exposed Structure Granulation Quality: Red Fascia Exposed: No Necrotic Amount: Large (67-100%) Fat Layer (Subcutaneous Tissue) Exposed: Yes Necrotic Quality: Adherent Slough Tendon Exposed: No Muscle Exposed: No Joint Exposed: No Bone Exposed: No Periwound Skin Texture Texture Color No Abnormalities Noted: No No Abnormalities Noted: No Callus: No Atrophie Blanche: No Crepitus: No Cyanosis: No Excoriation: No Ecchymosis: No Induration: No Erythema: No Rash: No Hemosiderin Staining: No Scarring: No Mottled: No Pallor: No Moisture Rubor: No No Abnormalities Noted: No Dry / Scaly: No Temperature / Pain Maceration: No Temperature: No Abnormality Treatment Notes Wound #2 (Back) Wound Laterality: Medial Cleanser Vashe 5.8 (oz) Discharge Instruction: Cleanse the wound with Vashe prior to applying Mark clean dressing using gauze sponges, not tissue or cotton balls. Peri-Wound Care Skin Prep Discharge Instruction: Use skin prep as directed Topical Santyl Collagenase Ointment, 30 (gm), tube Primary Dressing Hydrofera Blue Ready Transfer Foam, 4x5 (in/in) Discharge Instruction: Apply to wound bed as instructed Secondary Dressing Zetuvit Plus Silicone Border Dressing 4x4 (in/in) Discharge Instruction: Apply silicone border over primary dressing as directed. Secured With Compression Wrap Compression Stockings Facilities manager) Signed: 06/17/2023 3:50:25 PM By: Karie Schwalbe RN Entered By: Karie Schwalbe on 06/17/2023 11:23:07 -------------------------------------------------------------------------------- Wound Assessment Details Patient Name: Date of Service: [name], [name]. 06/17/2023 10:15 Mark M Medical Record Number:  [id number] Patient Account Number: [id number] Date of Birth/Sex: Treating RN: [birth year] (62 y.o. M) Primary Care Mysty Kielty: [facility] Other Clinician: Referring Maddilynn Esperanza: Treating Zelphia Glover/Extender: [name] in Treatment: 4 Mignone, Quantavius [name] ([id number]) [id number] Page 8 of 13 Wound Status Wound Number: 3 Primary Etiology: Pressure Ulcer Wound Location: Left Gluteus Wound Status: Open Wounding Event: Gradually Appeared Comorbid History: Asthma Date Acquired: 05/14/2022 Weeks Of Treatment: 4 Clustered Wound: No Photos Wound Measurements Length: (cm) 5.9 Width: (cm) 3.8 Depth: (cm) 2.4 Area: (cm) 17.609 Volume: (cm) 42.261 % Reduction in Area: -35.9% % Reduction in Volume: 6.8% Tunneling: Yes Location 1 Position (o'clock): 12 Maximum Distance: (cm) 6 Location 2 Position (o'clock): 1 Maximum Distance: (cm) 5.7 Undermining: Yes Starting Position (o'clock): 6 Ending Position (o'clock): 8 Maximum Distance: (cm) 1.4 Wound Description Classification: Category/Stage IV Wound Margin: Distinct, outline attached Exudate Amount: Medium Exudate Type: Serosanguineous Exudate Color: red, brown Foul Odor After Cleansing: No Slough/Fibrino No Wound Bed Granulation Amount: Medium (34-66%) Exposed Structure Granulation Quality: Red, Pink Fat Layer (Subcutaneous Tissue) Exposed: Yes Necrotic Amount: Medium (34-66%) Muscle Exposed: Yes Necrotic Quality: Adherent Slough Necrosis of Muscle: No Bone Exposed: Yes Periwound Skin Texture Texture Color No Abnormalities Noted: No No Abnormalities Noted: No Scarring: Yes Hemosiderin Staining: Yes Moisture Temperature / Pain No Abnormalities Noted: No Temperature: No Abnormality Dry / Scaly: No Maceration: No Treatment Notes Wound #3 (Gluteus) Wound Laterality: Left Cleanser Vashe 5.8 (oz) Discharge Instruction: Cleanse the wound with Vashe prior to applying Mark clean dressing using  gauze sponges, not tissue or cotton balls. Peri-Wound Care Topical Diante, Saxon Rushton [name] ([id number]) [id number] Page 9 of 13 Primary Dressing Dakin's Solution 0.25%, 16 (oz) Discharge Instruction: Moisten gauze with Dakin's solution Secondary Dressing Zetuvit Plus Silicone Border Dressing 7x7(in/in) Discharge Instruction: Apply silicone border over primary dressing as directed. Secured With Compression Wrap Compression Stockings Facilities manager) Signed: 06/23/2023 4:43:35 PM By: Thayer Dallas Entered By: Thayer Dallas on 06/17/2023 10:31:22 -------------------------------------------------------------------------------- Wound Assessment Details Patient Name: Date of Service: [name], [name].  06/17/2023 10:15 Mark M Medical Record Number: [id number] Patient Account Number: [id number] Date of Birth/Sex: Treating RN: [birth year] (62 y.o. M) Primary Care Unique Sillas: [facility] Other Clinician: Referring Pollyann Roa: Treating Kendre Sires/Extender: [name] in Treatment: 4 Wound Status Wound Number: 4 Primary Etiology: Pressure Ulcer Wound Location: Right Gluteal fold Wound Status: Open Wounding Event: Pressure Injury Comorbid History: Asthma Date Acquired: 05/14/2022 Weeks Of Treatment: 4 Clustered Wound: No Photos Wound Measurements Length: (cm) 5.5 Width: (cm) 8.2 Depth: (cm) 2.6 Area: (cm) 35.421 Volume: (cm) 92.096 % Reduction in Area: -52.8% % Reduction in Volume: -13.5% Tunneling: Yes Position (o'clock): 11 Maximum Distance: (cm) 5.3 Undermining: Yes Starting Position (o'clock): 9 Ending Position (o'clock): 2 Maximum Distance: (cm) 3.7 Wound Description Classification: Category/Stage IV Wound Margin: Distinct, outline attached Fleig, Willam [name] ([id number]) Exudate Amount: Medium Exudate Type: Serous Exudate Color: amber Foul Odor After Cleansing: No Slough/Fibrino No [id number]  Page 10 of 13 Wound Bed Granulation Amount: Medium (34-66%) Exposed Structure Granulation Quality: Red, Pink, Friable Fascia Exposed: No Necrotic Amount: Small (1-33%) Fat Layer (Subcutaneous Tissue) Exposed: Yes Necrotic Quality: Adherent Slough Tendon Exposed: No Muscle Exposed: Yes Necrosis of Muscle: No Joint Exposed: No Bone Exposed: Yes Periwound Skin Texture Texture Color No Abnormalities Noted: Yes No Abnormalities Noted: Yes Moisture No Abnormalities Noted: Yes Treatment Notes Wound #4 (Gluteal fold) Wound Laterality: Right Cleanser Vashe 5.8 (oz) Discharge Instruction: Cleanse the wound with Vashe prior to applying Mark clean dressing using gauze sponges, not tissue or cotton balls. Peri-Wound Care Topical Primary Dressing Dakin's Solution 0.25%, 16 (oz) Discharge Instruction: Moisten gauze with Dakin's solution Secondary Dressing Zetuvit Plus Silicone Border Dressing 7x7(in/in) Discharge Instruction: Apply silicone border over primary dressing as directed. Secured With Compression Wrap Compression Stockings Facilities manager) Signed: 06/23/2023 4:43:35 PM By: Thayer Dallas Entered By: Thayer Dallas on 06/17/2023 10:41:17 -------------------------------------------------------------------------------- Wound Assessment Details Patient Name: Date of Service: [name], [name]. 06/17/2023 10:15 Mark M Medical Record Number: [id number] Patient Account Number: [id number] Date of Birth/Sex: Treating RN: 01-04-1961 (62 y.o. M) Primary Care Anushree Dorsi: [facility] Other Clinician: Referring Raihan Kimmel: Treating Abie Killian/Extender: [name] in Treatment: 4 Wound Status Wound Number: 5 Primary Etiology: Pressure Ulcer Wound Location: Distal, Midline Back Wound Status: Healed - Epithelialized Wounding Event: Pressure Injury Comorbid History: Asthma Date Acquired: 05/13/2023 Weeks Of Treatment: 2 Clustered Wound: No [name], Mouhamadou [name]  ([id number]) [id number] Page 11 of 13 Photos Wound Measurements Length: (cm) Width: (cm) Depth: (cm) Area: (cm) Volume: (cm) 0 % Reduction in Area: 100% 0 % Reduction in Volume: 100% 0 Epithelialization: Large (67-100%) 0 Tunneling: No 0 Undermining: No Wound Description Classification: Category/Stage II Exudate Amount: None Present Foul Odor After Cleansing: No Slough/Fibrino No Wound Bed Granulation Amount: None Present (0%) Necrotic Amount: None Present (0%) Periwound Skin Texture Texture Color No Abnormalities Noted: No No Abnormalities Noted: No Callus: No Atrophie Blanche: No Crepitus: No Cyanosis: No Excoriation: No Ecchymosis: No Induration: No Erythema: No Rash: No Hemosiderin Staining: No Scarring: No Mottled: No Pallor: No Moisture Rubor: No No Abnormalities Noted: No Dry / Scaly: No Maceration: No Electronic Signature(s) Signed: 06/17/2023 3:50:25 PM By: Karie Schwalbe RN Entered By: Karie Schwalbe on 06/17/2023 11:22:06 -------------------------------------------------------------------------------- Wound Assessment Details Patient Name: Date of Service: [name], [name]. 06/17/2023 10:15 Mark M Medical Record Number: [id number] Patient Account Number: [id number] Date of Birth/Sex: Treating RN: Mar 10, 1961 (62 y.o. M) Primary Care Kathe Wirick: [facility] Other Clinician: Referring Srihari Shellhammer: Treating Brynn Reznik/Extender: [name]  in Treatment: 4 Wound Status Wound Number: 6 Primary Etiology: Pressure Ulcer Wound Location: Right Gluteus Wound Status: Open Wounding Event: Pressure Injury Comorbid History: Asthma Date Acquired: 05/13/2023 Weeks Of Treatment: 2 Clustered Wound: No [name], Betzalel [name] ([id number]) [id number] Page 12 of 13 Photos Wound Measurements Length: (cm) 0.6 Width: (cm) 2.1 Depth: (cm) 0.2 Area: (cm) 0.99 Volume: (cm) 0.198 % Reduction in Area:  73.7% % Reduction in Volume: 47.5% Epithelialization: Small (1-33%) Tunneling: No Undermining: No Wound Description Classification: Category/Stage II Wound Margin: Distinct, outline attached Exudate Amount: Medium Exudate Type: Serosanguineous Exudate Color: red, brown Foul Odor After Cleansing: No Slough/Fibrino No Wound Bed Granulation Amount: Medium (34-66%) Exposed Structure Granulation Quality: Pink Fascia Exposed: No Necrotic Amount: Medium (34-66%) Fat Layer (Subcutaneous Tissue) Exposed: Yes Necrotic Quality: Adherent Slough Tendon Exposed: No Muscle Exposed: No Joint Exposed: No Bone Exposed: No Periwound Skin Texture Texture Color No Abnormalities Noted: No No Abnormalities Noted: No Callus: No Atrophie Blanche: No Crepitus: No Cyanosis: No Excoriation: No Ecchymosis: No Induration: No Erythema: No Rash: No Hemosiderin Staining: No Scarring: Yes Mottled: No Pallor: No Moisture Rubor: No No Abnormalities Noted: No Dry / Scaly: No Maceration: No Treatment Notes Wound #6 (Gluteus) Wound Laterality: Right Cleanser Vashe 5.8 (oz) Discharge Instruction: Cleanse the wound with Vashe prior to applying Mark clean dressing using gauze sponges, not tissue or cotton balls. Peri-Wound Care Skin Prep Discharge Instruction: Use skin prep as directed Topical Santyl Collagenase Ointment, 30 (gm), tube Primary Dressing Hydrofera Blue Ready Transfer Foam, 4x5 (in/in) Discharge Instruction: Apply to wound bed as instructed Secondary Dressing Zetuvit Plus Silicone Border Dressing 4x4 (in/in) Discharge Instruction: Apply silicone border over primary dressing as directed. [name] ([id number]) [id number] Page 13 of 13 Secured With Compression Wrap Compression Stockings Facilities manager) Signed: 06/23/2023 4:43:35 PM By: Thayer Dallas Entered By: Thayer Dallas on 06/17/2023  10:44:07 -------------------------------------------------------------------------------- Vitals Details Patient Name: Date of Service: [name], [name]. 06/17/2023 10:15 Mark M Medical Record Number: [id number] Patient Account Number: [id number] Date of Birth/Sex: Treating RN: 29-May-1961 (62 y.o. [name] Primary Care Tymber Stallings: [facility] Other Clinician: Referring Alonte Wulff: Treating Dietra Stokely/Extender: [name] in Treatment: 4 Vital Signs Time Taken: 10:15 Temperature ([name]): 98.6 Pulse (bpm): 73 Respiratory Rate (breaths/min): 16 Blood Pressure (mmHg): 96/65 Reference Range: 80 - 120 mg / dl Electronic Signature(s) Signed: 06/17/2023 3:50:25 PM By: Karie Schwalbe RN Entered By: Karie Schwalbe on 06/17/2023 10:15:43

## 2023-06-24 ENCOUNTER — Ambulatory Visit: Payer: No Typology Code available for payment source | Admitting: Neurology

## 2023-06-29 ENCOUNTER — Encounter: Payer: Self-pay | Admitting: Neurology

## 2023-06-29 ENCOUNTER — Other Ambulatory Visit: Payer: Self-pay

## 2023-06-29 MED ORDER — SANTYL 250 UNIT/GM EX OINT: 1.0000 | TOPICAL_OINTMENT | Freq: Every day | CUTANEOUS | 3 refills | Status: DC

## 2023-07-01 ENCOUNTER — Encounter (HOSPITAL_BASED_OUTPATIENT_CLINIC_OR_DEPARTMENT_OTHER): Payer: No Typology Code available for payment source | Admitting: Internal Medicine

## 2023-07-01 DIAGNOSIS — L89314 Pressure ulcer of right buttock, stage 4: Secondary | ICD-10-CM | POA: Diagnosis not present

## 2023-07-02 NOTE — Progress Notes (Signed)
Wound Status: Open Wounding Event: Gradually Appeared Comorbid History: Asthma Date Acquired: 05/14/2022 Weeks Of Treatment: 6 Clustered Wound: No Photos Wound Measurements Length: (cm) 6.5 Width: (cm) 2.5 Depth: (cm) 2.4 Area: (cm) 12.763 Volume: (cm) 30.631 % Reduction in Area: 1.5% % Reduction in Volume: 32.5% Tunneling: Yes Position (o'clock): 12 Maximum Distance: (cm) 5.8 Undermining: No Wound Description Classification: Category/Stage IV Wound Margin: Distinct, outline attached Exudate Amount: Medium Exudate Type: Serosanguineous Exudate Color: red, brown Foul Odor After Cleansing: No Slough/Fibrino No Wound Bed Granulation Amount: Medium (34-66%) Exposed Structure Granulation Quality: Red, Pink Fat Layer (Subcutaneous Tissue) Exposed: Yes Necrotic Amount: Medium (34-66%) Muscle Exposed: Yes Necrotic Quality: Adherent Slough Necrosis of Muscle: No Bone Exposed: Yes Periwound Skin Texture Texture Color No Abnormalities Noted: No No Abnormalities Noted: No Scarring: Yes Hemosiderin Staining: Yes Moisture Temperature / Pain No Abnormalities Noted: No Temperature: No Abnormality Dry / Scaly: No Maceration: No Electronic Signature(s) Signed: 07/02/2023 12:34:21 PM By: Mark Stall RN, BSN Entered By: [name] on 07/01/2023 10:37:54 Rynders, [name] ([id number]) [id number] Page 8 of 10 -------------------------------------------------------------------------------- Wound Assessment Details Patient Name: Date of Service: [name], [name]. 07/01/2023 1:30 PM Medical Record Number: [id number] Patient Account Number: [id number] Date of Birth/Sex: Treating RN: 10/26/1960 (62 y.o. [name] Primary Care [name]: [name], [name] Other Clinician: Referring [name]: Treating [name] [name], [name] in Treatment: 6 Wound Status Wound Number: 4 Primary Etiology: Pressure  Ulcer Wound Location: Right Gluteal fold Wound Status: Open Wounding Event: Pressure Injury Comorbid History: Asthma Date Acquired: 05/14/2022 Weeks Of Treatment: 6 Clustered Wound: No Photos Wound Measurements Length: (cm) 5.5 Width: (cm) 7.5 Depth: (cm) 2.6 Area: (cm) 32.398 Volume: (cm) 84.234 % Reduction in Area: -39.7% % Reduction in Volume: -3.8% Tunneling: No Undermining: Yes Starting Position (o'clock): 9 Ending Position (o'clock): 12 Maximum Distance: (cm) 5.5 Wound Description Classification: Category/Stage IV Wound Margin: Distinct, outline attached Exudate Amount: Medium Exudate Type: Serous Exudate Color: amber Foul Odor After Cleansing: No Slough/Fibrino No Wound Bed Granulation Amount: Medium (34-66%) Exposed Structure Granulation Quality: Red, Pink, Friable Fascia Exposed: No Necrotic Amount: Small (1-33%) Fat Layer (Subcutaneous Tissue) Exposed: Yes Necrotic Quality: Adherent Slough Tendon Exposed: No Muscle Exposed: Yes Necrosis of Muscle: No Joint Exposed: No Bone Exposed: No Periwound Skin Texture Texture Color No Abnormalities Noted: Yes No Abnormalities Noted: Yes Moisture No Abnormalities Noted: Yes Electronic Signature(s) [name], [name] ([id number]) [id number] Page 9 of 10 Signed: 07/02/2023 12:34:21 PM By: Mark Stall RN, BSN Entered By: [name] on 07/01/2023 10:40:29 -------------------------------------------------------------------------------- Wound Assessment Details Patient Name: Date of Service: [name], [name]. 07/01/2023 1:30 PM Medical Record Number: [id number] Patient Account Number: [id number] Date of Birth/Sex: Treating RN: 02-15-1961 (62 y.o. [name] Primary Care [name], [name] Other Clinician: Referring [name]: Treating [name] [name], [name] in Treatment: 6 Wound Status Wound Number: 6 Primary Etiology: Pressure  Ulcer Wound Location: Right Gluteus Wound Status: Open Wounding Event: Pressure Injury Comorbid History: Asthma Date Acquired: 05/13/2023 Weeks Of Treatment: 4 Clustered Wound: No Photos Wound Measurements Length: (cm) 0.2 Width: (cm) 0.2 Depth: (cm) 0.1 Area: (cm) 0.031 Volume: (cm) 0.003 % Reduction in Area: 99.2% % Reduction in Volume: 99.2% Epithelialization: Large (67-100%) Tunneling: No Undermining: No Wound Description Classification: Category/Stage II Wound Margin: Distinct, outline attached Exudate Amount: Small Exudate Type: Serosanguineous Exudate Color: red, brown Foul Odor After Cleansing: No Slough/Fibrino No Wound Bed Granulation Amount: Large (67-100%) Exposed Structure Granulation Quality:  care Screen for HBO Treatment Activities: Skin care regimen initiated : 05/18/2023 Topical wound management initiated : 05/18/2023 Notes: Electronic Signature(s) Signed: 07/02/2023 12:34:21 PM By: Mark Stall RN, BSN Entered By: [name] on 07/01/2023 10:22:52 -------------------------------------------------------------------------------- Pain Assessment Details Patient Name: Date of Service: [name], [name]. 07/01/2023 1:30 PM Medical Record Number: [id number] Patient Account Number: [id number] Date of Birth/Sex: Treating RN: 07/30/1961 (62 y.o. [name] Primary Care [name]: [name], [name] Other Clinician: Referring [name]: Treating [name]/Extender: [name] [name], [name] in Treatment: 6 Active Problems Location of Pain Severity and Description of Pain Patient Has Paino No Site Locations [place], [place] [name] ([id number])  [id number] Page 5 of 10 Pain Management and Medication Current Pain Management: Electronic Signature(s) Signed: 07/02/2023 12:34:21 PM By: Mark Stall RN, BSN Entered By: [name] on 07/01/2023 10:22:10 -------------------------------------------------------------------------------- Patient/Caregiver Education Details Patient Name: Date of Service: [name], [name] 9/19/2024andnbsp1:30 PM Medical Record Number: [id number] Patient Account Number: [id number] Date of Birth/Gender: Treating RN: [birth year] (62 y.o. [name] Primary Care Physician: [name], [name] Other Clinician: Referring Physician: Treating Physician/Extender: [name] [name], [name] in Treatment: 6 Education Assessment Education Provided To: Patient Education Topics Provided Wound/Skin Impairment: Handouts: Caring for Your Ulcer Methods: Explain/Verbal Responses: Reinforcements needed Electronic Signature(s) Signed: 07/02/2023 12:34:21 PM By: Mark Stall RN, BSN Entered By: [name] on 07/01/2023 10:23:05 -------------------------------------------------------------------------------- Wound Assessment Details Patient Name: Date of Service: [name], [name]. 07/01/2023 1:30 PM Medical Record Number: [id number] Patient Account Number: [id number] Date of Birth/Sex: Treating RN: [birth year] (62 y.o. [name] Primary Care Trichelle Lehan: [name], [name] Other Clinician: [name], Aiyden [name] ([id number]) [id number] Page 6 of 10 Referring Zyann Mabry: Treating Ladanian Kelter/Extender: [name] [name], [name] in Treatment: 6 Wound Status Wound Number: 2 Primary Etiology: Pressure Ulcer Wound Location: Medial Back Wound Status: Open Wounding Event: Gradually Appeared Comorbid History: Asthma Date Acquired: 05/14/2022 Weeks Of Treatment: 6 Clustered Wound: No Photos Wound Measurements Length: (cm)  1.9 Width: (cm) 0.8 Depth: (cm) 0.1 Area: (cm) 1.194 Volume: (cm) 0.119 % Reduction in Area: 44.7% % Reduction in Volume: 72.5% Epithelialization: Small (1-33%) Tunneling: No Undermining: No Wound Description Classification: Category/Stage III Wound Margin: Distinct, outline attached Exudate Amount: Medium Exudate Type: Serosanguineous Exudate Color: red, brown Foul Odor After Cleansing: No Slough/Fibrino Yes Wound Bed Granulation Amount: Small (1-33%) Exposed Structure Granulation Quality: Red Fascia Exposed: No Necrotic Amount: Large (67-100%) Fat Layer (Subcutaneous Tissue) Exposed: Yes Necrotic Quality: Adherent Slough Tendon Exposed: No Muscle Exposed: No Joint Exposed: No Bone Exposed: No Periwound Skin Texture Texture Color No Abnormalities Noted: No No Abnormalities Noted: No Callus: No Atrophie Blanche: No Crepitus: No Cyanosis: No Excoriation: No Ecchymosis: No Induration: No Erythema: No Rash: No Hemosiderin Staining: No Scarring: No Mottled: No Pallor: No Moisture Rubor: No No Abnormalities Noted: No Dry / Scaly: No Temperature / Pain Maceration: No Temperature: No Abnormality Electronic Signature(s) Signed: 07/02/2023 12:34:21 PM By: Mark Stall RN, BSN Entered By: [name] on 07/01/2023 10:35:39 Whitter, Breaker [name] ([id number]) [id number] Page 7 of 10 -------------------------------------------------------------------------------- Wound Assessment Details Patient Name: Date of Service: [name], [name]. 07/01/2023 1:30 PM Medical Record Number: [id number] Patient Account Number: [id number] Date of Birth/Sex: Treating RN: [birth year] (62 y.o. [name] Primary Care Sanjuan Sawa: [name], [name] Other Clinician: Referring Gola Bribiesca: Treating Layth Cerezo/Extender: Kathaleen Grinder in Treatment: 6 Wound Status Wound Number: 3 Primary Etiology: Pressure Ulcer Wound Location: Left  Gluteus  Wound Status: Open Wounding Event: Gradually Appeared Comorbid History: Asthma Date Acquired: 05/14/2022 Weeks Of Treatment: 6 Clustered Wound: No Photos Wound Measurements Length: (cm) 6.5 Width: (cm) 2.5 Depth: (cm) 2.4 Area: (cm) 12.763 Volume: (cm) 30.631 % Reduction in Area: 1.5% % Reduction in Volume: 32.5% Tunneling: Yes Position (o'clock): 12 Maximum Distance: (cm) 5.8 Undermining: No Wound Description Classification: Category/Stage IV Wound Margin: Distinct, outline attached Exudate Amount: Medium Exudate Type: Serosanguineous Exudate Color: red, brown Foul Odor After Cleansing: No Slough/Fibrino No Wound Bed Granulation Amount: Medium (34-66%) Exposed Structure Granulation Quality: Red, Pink Fat Layer (Subcutaneous Tissue) Exposed: Yes Necrotic Amount: Medium (34-66%) Muscle Exposed: Yes Necrotic Quality: Adherent Slough Necrosis of Muscle: No Bone Exposed: Yes Periwound Skin Texture Texture Color No Abnormalities Noted: No No Abnormalities Noted: No Scarring: Yes Hemosiderin Staining: Yes Moisture Temperature / Pain No Abnormalities Noted: No Temperature: No Abnormality Dry / Scaly: No Maceration: No Electronic Signature(s) Signed: 07/02/2023 12:34:21 PM By: Mark Stall RN, BSN Entered By: [name] on 07/01/2023 10:37:54 Rynders, [name] ([id number]) [id number] Page 8 of 10 -------------------------------------------------------------------------------- Wound Assessment Details Patient Name: Date of Service: [name], [name]. 07/01/2023 1:30 PM Medical Record Number: [id number] Patient Account Number: [id number] Date of Birth/Sex: Treating RN: 10/26/1960 (62 y.o. [name] Primary Care [name]: [name], [name] Other Clinician: Referring [name]: Treating [name] [name], [name] in Treatment: 6 Wound Status Wound Number: 4 Primary Etiology: Pressure  Ulcer Wound Location: Right Gluteal fold Wound Status: Open Wounding Event: Pressure Injury Comorbid History: Asthma Date Acquired: 05/14/2022 Weeks Of Treatment: 6 Clustered Wound: No Photos Wound Measurements Length: (cm) 5.5 Width: (cm) 7.5 Depth: (cm) 2.6 Area: (cm) 32.398 Volume: (cm) 84.234 % Reduction in Area: -39.7% % Reduction in Volume: -3.8% Tunneling: No Undermining: Yes Starting Position (o'clock): 9 Ending Position (o'clock): 12 Maximum Distance: (cm) 5.5 Wound Description Classification: Category/Stage IV Wound Margin: Distinct, outline attached Exudate Amount: Medium Exudate Type: Serous Exudate Color: amber Foul Odor After Cleansing: No Slough/Fibrino No Wound Bed Granulation Amount: Medium (34-66%) Exposed Structure Granulation Quality: Red, Pink, Friable Fascia Exposed: No Necrotic Amount: Small (1-33%) Fat Layer (Subcutaneous Tissue) Exposed: Yes Necrotic Quality: Adherent Slough Tendon Exposed: No Muscle Exposed: Yes Necrosis of Muscle: No Joint Exposed: No Bone Exposed: No Periwound Skin Texture Texture Color No Abnormalities Noted: Yes No Abnormalities Noted: Yes Moisture No Abnormalities Noted: Yes Electronic Signature(s) [name], [name] ([id number]) [id number] Page 9 of 10 Signed: 07/02/2023 12:34:21 PM By: Mark Stall RN, BSN Entered By: [name] on 07/01/2023 10:40:29 -------------------------------------------------------------------------------- Wound Assessment Details Patient Name: Date of Service: [name], [name]. 07/01/2023 1:30 PM Medical Record Number: [id number] Patient Account Number: [id number] Date of Birth/Sex: Treating RN: 02-15-1961 (62 y.o. [name] Primary Care [name], [name] Other Clinician: Referring [name]: Treating [name] [name], [name] in Treatment: 6 Wound Status Wound Number: 6 Primary Etiology: Pressure  Ulcer Wound Location: Right Gluteus Wound Status: Open Wounding Event: Pressure Injury Comorbid History: Asthma Date Acquired: 05/13/2023 Weeks Of Treatment: 4 Clustered Wound: No Photos Wound Measurements Length: (cm) 0.2 Width: (cm) 0.2 Depth: (cm) 0.1 Area: (cm) 0.031 Volume: (cm) 0.003 % Reduction in Area: 99.2% % Reduction in Volume: 99.2% Epithelialization: Large (67-100%) Tunneling: No Undermining: No Wound Description Classification: Category/Stage II Wound Margin: Distinct, outline attached Exudate Amount: Small Exudate Type: Serosanguineous Exudate Color: red, brown Foul Odor After Cleansing: No Slough/Fibrino No Wound Bed Granulation Amount: Large (67-100%) Exposed Structure Granulation Quality:  Wound Status: Open Wounding Event: Gradually Appeared Comorbid History: Asthma Date Acquired: 05/14/2022 Weeks Of Treatment: 6 Clustered Wound: No Photos Wound Measurements Length: (cm) 6.5 Width: (cm) 2.5 Depth: (cm) 2.4 Area: (cm) 12.763 Volume: (cm) 30.631 % Reduction in Area: 1.5% % Reduction in Volume: 32.5% Tunneling: Yes Position (o'clock): 12 Maximum Distance: (cm) 5.8 Undermining: No Wound Description Classification: Category/Stage IV Wound Margin: Distinct, outline attached Exudate Amount: Medium Exudate Type: Serosanguineous Exudate Color: red, brown Foul Odor After Cleansing: No Slough/Fibrino No Wound Bed Granulation Amount: Medium (34-66%) Exposed Structure Granulation Quality: Red, Pink Fat Layer (Subcutaneous Tissue) Exposed: Yes Necrotic Amount: Medium (34-66%) Muscle Exposed: Yes Necrotic Quality: Adherent Slough Necrosis of Muscle: No Bone Exposed: Yes Periwound Skin Texture Texture Color No Abnormalities Noted: No No Abnormalities Noted: No Scarring: Yes Hemosiderin Staining: Yes Moisture Temperature / Pain No Abnormalities Noted: No Temperature: No Abnormality Dry / Scaly: No Maceration: No Electronic Signature(s) Signed: 07/02/2023 12:34:21 PM By: Mark Stall RN, BSN Entered By: [name] on 07/01/2023 10:37:54 Rynders, [name] ([id number]) [id number] Page 8 of 10 -------------------------------------------------------------------------------- Wound Assessment Details Patient Name: Date of Service: [name], [name]. 07/01/2023 1:30 PM Medical Record Number: [id number] Patient Account Number: [id number] Date of Birth/Sex: Treating RN: 10/26/1960 (62 y.o. [name] Primary Care [name]: [name], [name] Other Clinician: Referring [name]: Treating [name] [name], [name] in Treatment: 6 Wound Status Wound Number: 4 Primary Etiology: Pressure  Ulcer Wound Location: Right Gluteal fold Wound Status: Open Wounding Event: Pressure Injury Comorbid History: Asthma Date Acquired: 05/14/2022 Weeks Of Treatment: 6 Clustered Wound: No Photos Wound Measurements Length: (cm) 5.5 Width: (cm) 7.5 Depth: (cm) 2.6 Area: (cm) 32.398 Volume: (cm) 84.234 % Reduction in Area: -39.7% % Reduction in Volume: -3.8% Tunneling: No Undermining: Yes Starting Position (o'clock): 9 Ending Position (o'clock): 12 Maximum Distance: (cm) 5.5 Wound Description Classification: Category/Stage IV Wound Margin: Distinct, outline attached Exudate Amount: Medium Exudate Type: Serous Exudate Color: amber Foul Odor After Cleansing: No Slough/Fibrino No Wound Bed Granulation Amount: Medium (34-66%) Exposed Structure Granulation Quality: Red, Pink, Friable Fascia Exposed: No Necrotic Amount: Small (1-33%) Fat Layer (Subcutaneous Tissue) Exposed: Yes Necrotic Quality: Adherent Slough Tendon Exposed: No Muscle Exposed: Yes Necrosis of Muscle: No Joint Exposed: No Bone Exposed: No Periwound Skin Texture Texture Color No Abnormalities Noted: Yes No Abnormalities Noted: Yes Moisture No Abnormalities Noted: Yes Electronic Signature(s) [name], [name] ([id number]) [id number] Page 9 of 10 Signed: 07/02/2023 12:34:21 PM By: Mark Stall RN, BSN Entered By: [name] on 07/01/2023 10:40:29 -------------------------------------------------------------------------------- Wound Assessment Details Patient Name: Date of Service: [name], [name]. 07/01/2023 1:30 PM Medical Record Number: [id number] Patient Account Number: [id number] Date of Birth/Sex: Treating RN: 02-15-1961 (62 y.o. [name] Primary Care [name], [name] Other Clinician: Referring [name]: Treating [name] [name], [name] in Treatment: 6 Wound Status Wound Number: 6 Primary Etiology: Pressure  Ulcer Wound Location: Right Gluteus Wound Status: Open Wounding Event: Pressure Injury Comorbid History: Asthma Date Acquired: 05/13/2023 Weeks Of Treatment: 4 Clustered Wound: No Photos Wound Measurements Length: (cm) 0.2 Width: (cm) 0.2 Depth: (cm) 0.1 Area: (cm) 0.031 Volume: (cm) 0.003 % Reduction in Area: 99.2% % Reduction in Volume: 99.2% Epithelialization: Large (67-100%) Tunneling: No Undermining: No Wound Description Classification: Category/Stage II Wound Margin: Distinct, outline attached Exudate Amount: Small Exudate Type: Serosanguineous Exudate Color: red, brown Foul Odor After Cleansing: No Slough/Fibrino No Wound Bed Granulation Amount: Large (67-100%) Exposed Structure Granulation Quality:  [name] ([id number]) [id number] Page 1 of 10 Visit Report for 07/01/2023 Arrival Information Details Patient Name: Date of Service: Danella Deis DA M [name]. 07/01/2023 1:30 PM Medical Record Number: [id number] Patient Account Number: [id number] Date of Birth/Sex: Treating RN: 1961/08/01 (62 y.o. [name] Primary Care Eliceo Gladu: [name], [name] Other Clinician: Referring Elisabel Hanover: Treating Danitza Schoenfeldt/Extender: [name] [name], [name] in Treatment: 6 Visit Information History Since Last Visit Added or deleted any medications: No Patient Arrived: Wheel Chair Any new allergies or adverse reactions: No Arrival Time: 13:19 Had Mark fall or experienced change in No Accompanied By: wife activities of daily living that may affect Transfer Assistance: Michiel Sites Lift risk of falls: Patient Identification Verified: Yes Signs or symptoms of abuse/neglect since last visito No Secondary Verification Process Completed: Yes Hospitalized since last visit: No Patient Requires Transmission-Based Precautions: No Implantable device outside of the [name] excluding No Patient Has Alerts: No cellular tissue based products placed in the center since last visit: Has Dressing in Place as Prescribed: Yes Pain Present Now: No Electronic Signature(s) Signed: 07/02/2023 12:34:21 PM By: Mark Stall RN, BSN Entered By: [name] on 07/01/2023 10:20:07 -------------------------------------------------------------------------------- Lower Extremity Assessment Details Patient Name: Date of Service: [name], [name]. 07/01/2023 1:30 PM Medical Record Number: [id number] Patient Account Number: [id number] Date of Birth/Sex: Treating RN: 04-08-1961 (62 y.o. [name] Primary Care Xzayvion Vaeth: [name], [name] Other Clinician: Referring Randeep Biondolillo: Treating Gwyneth Fernandez/Extender: Kathaleen Grinder in Treatment: 6 Electronic  Signature(s) Signed: 07/02/2023 12:34:21 PM By: Mark Stall RN, BSN Entered By: [name] on 07/01/2023 10:22:19 -------------------------------------------------------------------------------- Multi Wound Chart Details Patient Name: Date of Service: [name], [name]. 07/01/2023 1:30 PM Medical Record Number: [id number] Patient Account Number: [id number] Date of Birth/Sex: Treating RN: 1960-11-19 (62 y.o. M) Primary Care Tristine Langi: [name], [name] Other Clinician: Referring Mavi Un: Treating Charistopher Rumble/Extender: Kathaleen Grinder in Treatment: 6 [place], Tyran [name] ([id number]) [id number] Page 2 of 10 Vital Signs Height(in): Pulse(bpm): 72 Weight(lbs): Blood Pressure(mmHg): 97/64 Body Mass Index(BMI): Temperature([name]): 98.3 Respiratory Rate(breaths/min): 16 [2:Photos:] Medial Back Left Gluteus Right Gluteal fold Wound Location: Gradually Appeared Gradually Appeared Pressure Injury Wounding Event: Pressure Ulcer Pressure Ulcer Pressure Ulcer Primary Etiology: Asthma Asthma Asthma Comorbid History: 05/14/2022 05/14/2022 05/14/2022 Date Acquired: 6 6 6  Weeks of Treatment: Open Open Open Wound Status: No No No Wound Recurrence: 1.9x0.8x0.1 6.5x2.5x2.4 5.5x7.5x2.6 Measurements L x W x D (cm) 1.194 12.763 32.398 Mark (cm) : rea 0.119 30.631 84.234 Volume (cm) : 44.70% 1.50% -39.70% % Reduction in Mark rea: 72.50% 32.50% -3.80% % Reduction in Volume: 12 Position 1 (o'clock): 5.8 Maximum Distance 1 (cm): 9 Starting Position 1 (o'clock): 12 Ending Position 1 (o'clock): 5.5 Maximum Distance 1 (cm): No Yes No Tunneling: No No Yes Undermining: Category/Stage III Category/Stage IV Category/Stage IV Classification: Medium Medium Medium Exudate Mark mount: Serosanguineous Serosanguineous Serous Exudate Type: red, brown red, brown amber Exudate Color: Distinct, outline attached Distinct, outline attached Distinct,  outline attached Wound Margin: Small (1-33%) Medium (34-66%) Medium (34-66%) Granulation Mark mount: Red Red, Pink Red, Pink, Friable Granulation Quality: Large (67-100%) Medium (34-66%) Small (1-33%) Necrotic Mark mount: Fat Layer (Subcutaneous Tissue): Yes Fat Layer (Subcutaneous Tissue): Yes Fat Layer (Subcutaneous Tissue): Yes Exposed Structures: Fascia: No Muscle: Yes Muscle: Yes Tendon: No Bone: Yes Fascia: No Muscle: No Tendon: No Joint: No Joint: No Bone: No Bone: No Small (1-33%) N/Mark N/Mark Epithelialization: Debridement - Selective/Open Wound N/Mark N/Mark Debridement: Pre-procedure Verification/Time Out

## 2023-07-05 NOTE — Progress Notes (Signed)
[name] ([id number]) [id number] Page 1 of 8 Visit Report for 07/01/2023 Debridement Details Patient Name: Date of Service: [name] DA M [name]. 07/01/2023 1:30 PM Medical Record Number: [id number] Patient Account Number: [id number] Date of Birth/Sex: Treating RN: [birth year] (62 y.o. M) Primary Care Provider: [facility] Other Clinician: Referring Provider: Treating Provider/Extender: Baltazar Najjar [facility], [place] in Treatment: 6 Debridement Performed for Assessment: Wound #2 Medial Back Performed By: Physician Maxwell Caul., MD Debridement Type: Debridement Level of Consciousness (Pre-procedure): Awake and Alert Pre-procedure Verification/Time Out Yes - 14:05 Taken: Start Time: 14:09 Percent of Wound Bed Debrided: 100% T Area Debrided (cm): otal 1.19 Tissue and other material debrided: Non-Viable, Slough, Skin: Epidermis, Slough Level: Skin/Epidermis Debridement Description: Selective/Open Wound Instrument: Curette Bleeding: Minimum Hemostasis Achieved: Pressure Response to Treatment: Procedure was tolerated well Level of Consciousness (Post- Awake and Alert procedure): Post Debridement Measurements of Total Wound Length: (cm) 1.9 Stage: Category/Stage III Width: (cm) 0.8 Depth: (cm) 0.1 Volume: (cm) 0.119 Character of Wound/Ulcer Post Debridement: Improved Post Procedure Diagnosis Same as Pre-procedure Electronic Signature(s) Signed: 07/01/2023 4:42:34 PM By: Baltazar Najjar MD Entered By: Baltazar Najjar on 07/01/2023 11:53:53 -------------------------------------------------------------------------------- HPI Details Patient Name: Date of Service: [name], A DA M [name]. 07/01/2023 1:30 PM Medical Record Number: [id number] Patient Account Number: [id number] Date of Birth/Sex: Treating RN: 1961/07/03 (62 y.o. M) Primary Care Provider: [facility] Other Clinician: Referring Provider: Treating  Provider/Extender: Milas Hock, [place] in Treatment: 6 History of Present Illness HPI Description: 05/18/2023 [name] is a 62 year old male with a past medical history of multiple sclerosis and neurogenic bladder with chronic indwelling Foley catheter that presents to the clinic for a 1 year history of nonhealing ulcers to the bilateral ischium As well as upper back. He has an air mattress. He spends about 50% of the time in the bed and 50% of the time in the wheelchair. He does not have a Roho cushion. He has been using Medihoney and Santyl to the wound beds. He [name], [name] ([id number]) [id number] Page 2 of 8 currently denies systemic signs of infection. Of note he was hospitalized on 2 occasions in January 2024 for trigeminal neuralgia. He has lost a significant amount of weight due to this and his oral intake has declined. He was evaluated for PEG tube but was not a candidate. 8/19; patient presents for follow-up. Patient has home health. He has been using Dakin's wet-to-dry dressings to the ischial wounds and Santyl to the back wound. Patient was recently hospitalized for UTI and received IV Zosyn for 3 days followed by fosfomycin. He completed 6 days of antibiotics. He currently denies systemic signs of infection. He also had a portable pelvic x-ray that did not show any obvious erosive changes. Wife is presentAnd she states she is trying to aggressively offload the wound beds by repositioning him on his sides. Unfortunately he has developed two wounds 1 to the right buttocks and 1 to the midline back. 9/5; patient presents for follow-up. Wife is present. She has been using Dakin's wet-to-dry dressings to the ischial wounds and Santyl to the back wounds and right buttocks wound. Overall wounds have improved in appearance and size since last clinic visit. 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 He is here with his wife. 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 Peri-Wound Care: Skin Prep (Home Health) 1 x Per Day/30 Days Discharge Instructions: Use skin prep as directed Topical: Santyl Collagenase Ointment, 30 (gm), tube 1 x Per Day/30 Days Prim Dressing: Hydrofera Blue Ready Transfer Foam, 4x5 (in/in) (Generic) 1 x Per Day/30 Days ary Discharge Instructions: Apply to wound bed as instructed Secondary Dressing: Zetuvit Plus Silicone Border Dressing 4x4 (in/in) (Home Health) 1 x Per Day/30 Days Discharge Instructions: Apply silicone border over primary dressing as directed. Wound #3 - Gluteus Wound Laterality: Left Cleanser: Vashe 5.8 (oz) (Home Health) (Generic) 1 x Per Day/30 Days Discharge Instructions: Cleanse the wound with Vashe prior to applying a clean dressing using gauze sponges, not tissue or cotton balls. Prim Dressing: Dakin's Solution 0.25%, 16 (oz) (Home Health) 1 x Per Day/30 Days ary Discharge Instructions: Moisten gauze with Dakin's solution Secondary Dressing: Zetuvit Plus Silicone Border Dressing 7x7(in/in) (Home Health) 1 x Per Day/30 Days Discharge Instructions: Apply silicone border over primary dressing as directed. Wound #4 - Gluteal fold Wound Laterality: Right Cleanser: Vashe 5.8 (oz) (Home Health) (Generic) 1 x Per Day/30 Days Discharge Instructions: Cleanse the wound with Vashe prior to applying a clean dressing using gauze sponges, not tissue or cotton balls. Prim Dressing: Dakin's Solution 0.25%, 16 (oz) (Home Health) 1 x Per Day/30 Days ary Discharge Instructions: Moisten gauze with  Dakin's solution Secondary Dressing: Zetuvit Plus Silicone Border Dressing 7x7(in/in) (Home Health) 1 x Per Day/30 Days Discharge Instructions: Apply silicone border over primary dressing as directed. Wound #6 - Gluteus Wound Laterality: Right Cleanser: Vashe 5.8 (oz) (Home Health) 1 x Per Day/30 Days Discharge Instructions: Cleanse the wound with Vashe prior to applying a clean dressing using gauze sponges, not tissue or cotton balls. Peri-Wound Care: Skin Prep (Home Health) 1 x Per Day/30 Days Discharge Instructions: Use skin prep as directed Topical: Santyl Collagenase Ointment, 30 (gm), tube 1 x Per Day/30 Days Prim Dressing: Hydrofera Blue Ready Transfer Foam, 4x5 (in/in) (Generic) 1 x Per Day/30 Days Gwynneth Macleod, [name] ([id number]) [id number] Page 4 of 8 Discharge Instructions: Apply to wound bed as instructed Secondary Dressing: Zetuvit Plus Silicone Border Dressing 4x4 (in/in) (Home Health) 1 x Per Day/30 Days Discharge Instructions: Apply silicone border over primary dressing as directed. Patient Medications llergies: gadopiclenol A Notifications Medication Indication Start End 07/01/2023 lidocaine DOSE topical 4 % cream - cream topical once daily Electronic Signature(s) Signed: 07/01/2023 4:42:34 PM By: Baltazar Najjar MD Signed: 07/05/2023 3:06:38 PM By: Redmond Pulling RN, BSN Entered By: Redmond Pulling on 07/01/2023 11:21:06 -------------------------------------------------------------------------------- Problem List Details Patient Name: Date of Service: [name], A DA M [name]. 07/01/2023 1:30 PM Medical Record Number: [id number] Patient Account Number: [id number] Date of Birth/Sex: Treating RN: 20-Aug-1961 (62 y.o. [name] Primary Care Provider: [facility] Other Clinician: Referring Provider: Treating Provider/Extender: Baltazar Najjar [facility], [place] in Treatment: 6 Active Problems ICD-10 Encounter Code  Description Active Date MDM Diagnosis L89.314 Pressure ulcer of right buttock, stage 4 05/18/2023 No Yes L89.324 Pressure ulcer of left buttock, stage 4 05/18/2023 No Yes L89.312 Pressure ulcer of right buttock, stage 2 05/31/2023 No Yes L89.103 Pressure ulcer of unspecified part of back, stage 3 05/18/2023 No Yes L89.102 Pressure ulcer of unspecified part of back, stage 2 05/31/2023 No Yes E44.0 Moderate protein-calorie malnutrition 05/18/2023 No Yes G35 Multiple sclerosis 05/18/2023 No Yes N31.9 Neuromuscular dysfunction of bladder, unspecified 05/18/2023  superficial areas on his back. His wife tells me she gets him up in the wheelchair at about 7:00 in the morning and he is there till 5:00 at night when they put him to bed. He is apparently eating and drinking well. We have been using Dakin's wet to dry did the stage IV wounds on his ischial tuberosities and Santyl with Hydrofera Blue to a small area on his back at roughly T4 Electronic Signature(s) Signed: 07/01/2023 4:42:34 PM By: Baltazar Najjar MD Entered By: Baltazar Najjar on 07/01/2023 11:55:43 -------------------------------------------------------------------------------- Physical Exam Details Patient Name: Date of Service: [name], A DA M [name]. 07/01/2023 1:30 PM Medical Record Number: [id number] Patient Account Number: [id number] Date of Birth/Sex: Treating RN: March 13, 1961 (62 y.o. M) Primary Care Provider: [facility] Other Clinician: Referring Provider: Treating Provider/Extender: Kathaleen Grinder in Treatment: 6 Constitutional Patient is hypotensive.. Pulse regular and within target range for patient.Marland Kitchen Respirations regular, non-labored and within target range.. Temperature is normal and within the target range for the patient.Marland Kitchen Appears in no distress. Notes Wound exam; the patient has large stage IV wounds over the ischial tuberosity. The area on the right has palpable bone on the left it is not so impressive however there is tissue over the ischial tuberosities but not a lot. He has more superficial wound on his upper thoracic spine. I used a #3 curette to remove adherent slough from this area hemostasis with direct pressure. Electronic Signature(s) Signed: 07/01/2023 4:42:34 PM By: Baltazar Najjar MD Entered By: Baltazar Najjar on 07/01/2023  11:57:39 -------------------------------------------------------------------------------- Physician Orders Details Patient Name: Date of Service: [name], A DA M [name]. 07/01/2023 1:30 PM Medical Record Number: [id number] Patient Account Number: [id number] Date of Birth/Sex: Treating RN: [birth year] (62 y.o. [name] Primary Care Provider: [facility] Other Clinician: Referring Provider: Treating Provider/Extender: [facility]tazar [facility], [place] in Treatment: 6 Verbal / Phone Orders: No Diagnosis Coding ICD-10 Coding Code Description L89.314 Pressure ulcer of right buttock, stage 4 L89.324 Pressure ulcer of left buttock, stage 4 L89.312 Pressure ulcer of right buttock, stage 2 Muhlbauer, [name] ([id number]) [id number] Page 3 of 8 L89.103 Pressure ulcer of unspecified part of back, stage 3 L89.102 Pressure ulcer of unspecified part of back, stage 2 E44.0 Moderate protein-calorie malnutrition G35 Multiple sclerosis N31.9 Neuromuscular dysfunction of bladder, unspecified Follow-up Appointments ppointment in 2 weeks. - HOYER extra time 60 minutes Dr. Mikey Bussing Thursday 07/15/23 @ 1030 Return A room 9 Return appointment in 1 month. - Pleas ask front desk for appointment Anesthetic (In clinic) Topical Lidocaine 4% applied to wound bed Bathing/ Shower/ Hygiene Other Bathing/Shower/Hygiene Orders/Instructions: - May bathe with soap and water Off-Loading Turn and reposition every 2 hours Other: - only an hour up to eat and back to bed. Turn every 2 hours. Best to stay in bed turn side to side and back one postition for max of 2 hrs Home Health New wound care orders this week; continue Home Health for wound care. May utilize formulary equivalent dressing for wound treatment orders unless otherwise specified. - Dakin's solution wet to dry to wounds #4 and #6. Santyl and hydrofera blue bordered foam to both back wounds. Other Home Health  Orders/Instructions: - Please see the patient at least once a week OR please see the patient's spouse regarding wound supplies ( to order supplies for the patient) [facility] Wound Treatment Wound #2 - Back Wound Laterality: Medial Cleanser: Vashe 5.8 (oz) (Home Health) 1 x Per Day/30 Days  superficial areas on his back. His wife tells me she gets him up in the wheelchair at about 7:00 in the morning and he is there till 5:00 at night when they put him to bed. He is apparently eating and drinking well. We have been using Dakin's wet to dry did the stage IV wounds on his ischial tuberosities and Santyl with Hydrofera Blue to a small area on his back at roughly T4 Electronic Signature(s) Signed: 07/01/2023 4:42:34 PM By: Baltazar Najjar MD Entered By: Baltazar Najjar on 07/01/2023 11:55:43 -------------------------------------------------------------------------------- Physical Exam Details Patient Name: Date of Service: [name], A DA M [name]. 07/01/2023 1:30 PM Medical Record Number: [id number] Patient Account Number: [id number] Date of Birth/Sex: Treating RN: March 13, 1961 (62 y.o. M) Primary Care Provider: [facility] Other Clinician: Referring Provider: Treating Provider/Extender: Kathaleen Grinder in Treatment: 6 Constitutional Patient is hypotensive.. Pulse regular and within target range for patient.Marland Kitchen Respirations regular, non-labored and within target range.. Temperature is normal and within the target range for the patient.Marland Kitchen Appears in no distress. Notes Wound exam; the patient has large stage IV wounds over the ischial tuberosity. The area on the right has palpable bone on the left it is not so impressive however there is tissue over the ischial tuberosities but not a lot. He has more superficial wound on his upper thoracic spine. I used a #3 curette to remove adherent slough from this area hemostasis with direct pressure. Electronic Signature(s) Signed: 07/01/2023 4:42:34 PM By: Baltazar Najjar MD Entered By: Baltazar Najjar on 07/01/2023  11:57:39 -------------------------------------------------------------------------------- Physician Orders Details Patient Name: Date of Service: [name], A DA M [name]. 07/01/2023 1:30 PM Medical Record Number: [id number] Patient Account Number: [id number] Date of Birth/Sex: Treating RN: [birth year] (62 y.o. [name] Primary Care Provider: [facility] Other Clinician: Referring Provider: Treating Provider/Extender: [facility]tazar [facility], [place] in Treatment: 6 Verbal / Phone Orders: No Diagnosis Coding ICD-10 Coding Code Description L89.314 Pressure ulcer of right buttock, stage 4 L89.324 Pressure ulcer of left buttock, stage 4 L89.312 Pressure ulcer of right buttock, stage 2 Muhlbauer, [name] ([id number]) [id number] Page 3 of 8 L89.103 Pressure ulcer of unspecified part of back, stage 3 L89.102 Pressure ulcer of unspecified part of back, stage 2 E44.0 Moderate protein-calorie malnutrition G35 Multiple sclerosis N31.9 Neuromuscular dysfunction of bladder, unspecified Follow-up Appointments ppointment in 2 weeks. - HOYER extra time 60 minutes Dr. Mikey Bussing Thursday 07/15/23 @ 1030 Return A room 9 Return appointment in 1 month. - Pleas ask front desk for appointment Anesthetic (In clinic) Topical Lidocaine 4% applied to wound bed Bathing/ Shower/ Hygiene Other Bathing/Shower/Hygiene Orders/Instructions: - May bathe with soap and water Off-Loading Turn and reposition every 2 hours Other: - only an hour up to eat and back to bed. Turn every 2 hours. Best to stay in bed turn side to side and back one postition for max of 2 hrs Home Health New wound care orders this week; continue Home Health for wound care. May utilize formulary equivalent dressing for wound treatment orders unless otherwise specified. - Dakin's solution wet to dry to wounds #4 and #6. Santyl and hydrofera blue bordered foam to both back wounds. Other Home Health  Orders/Instructions: - Please see the patient at least once a week OR please see the patient's spouse regarding wound supplies ( to order supplies for the patient) [facility] Wound Treatment Wound #2 - Back Wound Laterality: Medial Cleanser: Vashe 5.8 (oz) (Home Health) 1 x Per Day/30 Days  Discharge Instructions: Cleanse the wound with Vashe prior to applying a clean dressing using gauze sponges, not tissue or cotton balls. Peri-Wound Care: Skin Prep (Home Health) 1 x Per Day/30 Days Discharge Instructions: Use skin prep as directed Topical: Santyl Collagenase Ointment, 30 (gm), tube 1 x Per Day/30 Days Prim Dressing: Hydrofera Blue Ready Transfer Foam, 4x5 (in/in) (Generic) 1 x Per Day/30 Days ary Discharge Instructions: Apply to wound bed as instructed Secondary Dressing: Zetuvit Plus Silicone Border Dressing 4x4 (in/in) (Home Health) 1 x Per Day/30 Days Discharge Instructions: Apply silicone border over primary dressing as directed. Wound #3 - Gluteus Wound Laterality: Left Cleanser: Vashe 5.8 (oz) (Home Health) (Generic) 1 x Per Day/30 Days Discharge Instructions: Cleanse the wound with Vashe prior to applying a clean dressing using gauze sponges, not tissue or cotton balls. Prim Dressing: Dakin's Solution 0.25%, 16 (oz) (Home Health) 1 x Per Day/30 Days ary Discharge Instructions: Moisten gauze with Dakin's solution Secondary Dressing: Zetuvit Plus Silicone Border Dressing 7x7(in/in) (Home Health) 1 x Per Day/30 Days Discharge Instructions: Apply silicone border over primary dressing as directed. Wound #4 - Gluteal fold Wound Laterality: Right Cleanser: Vashe 5.8 (oz) (Home Health) (Generic) 1 x Per Day/30 Days Discharge Instructions: Cleanse the wound with Vashe prior to applying a clean dressing using gauze sponges, not tissue or cotton balls. Prim Dressing: Dakin's Solution 0.25%, 16 (oz) (Home Health) 1 x Per Day/30 Days ary Discharge Instructions: Moisten gauze with  Dakin's solution Secondary Dressing: Zetuvit Plus Silicone Border Dressing 7x7(in/in) (Home Health) 1 x Per Day/30 Days Discharge Instructions: Apply silicone border over primary dressing as directed. Wound #6 - Gluteus Wound Laterality: Right Cleanser: Vashe 5.8 (oz) (Home Health) 1 x Per Day/30 Days Discharge Instructions: Cleanse the wound with Vashe prior to applying a clean dressing using gauze sponges, not tissue or cotton balls. Peri-Wound Care: Skin Prep (Home Health) 1 x Per Day/30 Days Discharge Instructions: Use skin prep as directed Topical: Santyl Collagenase Ointment, 30 (gm), tube 1 x Per Day/30 Days Prim Dressing: Hydrofera Blue Ready Transfer Foam, 4x5 (in/in) (Generic) 1 x Per Day/30 Days Gwynneth Macleod, [name] ([id number]) [id number] Page 4 of 8 Discharge Instructions: Apply to wound bed as instructed Secondary Dressing: Zetuvit Plus Silicone Border Dressing 4x4 (in/in) (Home Health) 1 x Per Day/30 Days Discharge Instructions: Apply silicone border over primary dressing as directed. Patient Medications llergies: gadopiclenol A Notifications Medication Indication Start End 07/01/2023 lidocaine DOSE topical 4 % cream - cream topical once daily Electronic Signature(s) Signed: 07/01/2023 4:42:34 PM By: Baltazar Najjar MD Signed: 07/05/2023 3:06:38 PM By: Redmond Pulling RN, BSN Entered By: Redmond Pulling on 07/01/2023 11:21:06 -------------------------------------------------------------------------------- Problem List Details Patient Name: Date of Service: [name], A DA M [name]. 07/01/2023 1:30 PM Medical Record Number: [id number] Patient Account Number: [id number] Date of Birth/Sex: Treating RN: 20-Aug-1961 (62 y.o. [name] Primary Care Provider: [facility] Other Clinician: Referring Provider: Treating Provider/Extender: [facility]zar [facility], [place] in Treatment: 6 Active Problems ICD-10 Encounter Code  Description Active Date MDM Diagnosis L89.314 Pressure ulcer of right buttock, stage 4 05/18/2023 No Yes L89.324 Pressure ulcer of left buttock, stage 4 05/18/2023 No Yes L89.312 Pressure ulcer of right buttock, stage 2 05/31/2023 No Yes L89.103 Pressure ulcer of unspecified part of back, stage 3 05/18/2023 No Yes L89.102 Pressure ulcer of unspecified part of back, stage 2 05/31/2023 No Yes E44.0 Moderate protein-calorie malnutrition 05/18/2023 No Yes G35 Multiple sclerosis 05/18/2023 No Yes N31.9 Neuromuscular dysfunction of bladder, unspecified 05/18/2023  Discharge Instructions: Cleanse the wound with Vashe prior to applying a clean dressing using gauze sponges, not tissue or cotton balls. Peri-Wound Care: Skin Prep (Home Health) 1 x Per Day/30 Days Discharge Instructions: Use skin prep as directed Topical: Santyl Collagenase Ointment, 30 (gm), tube 1 x Per Day/30 Days Prim Dressing: Hydrofera Blue Ready Transfer Foam, 4x5 (in/in) (Generic) 1 x Per Day/30 Days ary Discharge Instructions: Apply to wound bed as instructed Secondary Dressing: Zetuvit Plus Silicone Border Dressing 4x4 (in/in) (Home Health) 1 x Per Day/30 Days Discharge Instructions: Apply silicone border over primary dressing as directed. Wound #3 - Gluteus Wound Laterality: Left Cleanser: Vashe 5.8 (oz) (Home Health) (Generic) 1 x Per Day/30 Days Discharge Instructions: Cleanse the wound with Vashe prior to applying a clean dressing using gauze sponges, not tissue or cotton balls. Prim Dressing: Dakin's Solution 0.25%, 16 (oz) (Home Health) 1 x Per Day/30 Days ary Discharge Instructions: Moisten gauze with Dakin's solution Secondary Dressing: Zetuvit Plus Silicone Border Dressing 7x7(in/in) (Home Health) 1 x Per Day/30 Days Discharge Instructions: Apply silicone border over primary dressing as directed. Wound #4 - Gluteal fold Wound Laterality: Right Cleanser: Vashe 5.8 (oz) (Home Health) (Generic) 1 x Per Day/30 Days Discharge Instructions: Cleanse the wound with Vashe prior to applying a clean dressing using gauze sponges, not tissue or cotton balls. Prim Dressing: Dakin's Solution 0.25%, 16 (oz) (Home Health) 1 x Per Day/30 Days ary Discharge Instructions: Moisten gauze with  Dakin's solution Secondary Dressing: Zetuvit Plus Silicone Border Dressing 7x7(in/in) (Home Health) 1 x Per Day/30 Days Discharge Instructions: Apply silicone border over primary dressing as directed. Wound #6 - Gluteus Wound Laterality: Right Cleanser: Vashe 5.8 (oz) (Home Health) 1 x Per Day/30 Days Discharge Instructions: Cleanse the wound with Vashe prior to applying a clean dressing using gauze sponges, not tissue or cotton balls. Peri-Wound Care: Skin Prep (Home Health) 1 x Per Day/30 Days Discharge Instructions: Use skin prep as directed Topical: Santyl Collagenase Ointment, 30 (gm), tube 1 x Per Day/30 Days Prim Dressing: Hydrofera Blue Ready Transfer Foam, 4x5 (in/in) (Generic) 1 x Per Day/30 Days Gwynneth Macleod, [name] ([id number]) [id number] Page 4 of 8 Discharge Instructions: Apply to wound bed as instructed Secondary Dressing: Zetuvit Plus Silicone Border Dressing 4x4 (in/in) (Home Health) 1 x Per Day/30 Days Discharge Instructions: Apply silicone border over primary dressing as directed. Patient Medications llergies: gadopiclenol A Notifications Medication Indication Start End 07/01/2023 lidocaine DOSE topical 4 % cream - cream topical once daily Electronic Signature(s) Signed: 07/01/2023 4:42:34 PM By: Baltazar Najjar MD Signed: 07/05/2023 3:06:38 PM By: Redmond Pulling RN, BSN Entered By: Redmond Pulling on 07/01/2023 11:21:06 -------------------------------------------------------------------------------- Problem List Details Patient Name: Date of Service: [name], A DA M [name]. 07/01/2023 1:30 PM Medical Record Number: [id number] Patient Account Number: [id number] Date of Birth/Sex: Treating RN: 20-Aug-1961 (62 y.o. [name] Primary Care Provider: [facility] Other Clinician: Referring Provider: Treating Provider/Extender: Baltazar [facility], [place] in Treatment: 6 Active Problems ICD-10 Encounter Code  Description Active Date MDM Diagnosis L89.314 Pressure ulcer of right buttock, stage 4 05/18/2023 No Yes L89.324 Pressure ulcer of left buttock, stage 4 05/18/2023 No Yes L89.312 Pressure ulcer of right buttock, stage 2 05/31/2023 No Yes L89.103 Pressure ulcer of unspecified part of back, stage 3 05/18/2023 No Yes L89.102 Pressure ulcer of unspecified part of back, stage 2 05/31/2023 No Yes E44.0 Moderate protein-calorie malnutrition 05/18/2023 No Yes G35 Multiple sclerosis 05/18/2023 No Yes N31.9 Neuromuscular dysfunction of bladder, unspecified 05/18/2023  [name] ([id number]) [id number] Page 1 of 8 Visit Report for 07/01/2023 Debridement Details Patient Name: Date of Service: [name] DA M [name]. 07/01/2023 1:30 PM Medical Record Number: [id number] Patient Account Number: [id number] Date of Birth/Sex: Treating RN: [birth year] (62 y.o. M) Primary Care Provider: [facility] Other Clinician: Referring Provider: Treating Provider/Extender: [facility], [place] in Treatment: 6 Debridement Performed for Assessment: Wound #2 Medial Back Performed By: Physician Maxwell Caul., MD Debridement Type: Debridement Level of Consciousness (Pre-procedure): Awake and Alert Pre-procedure Verification/Time Out Yes - 14:05 Taken: Start Time: 14:09 Percent of Wound Bed Debrided: 100% T Area Debrided (cm): otal 1.19 Tissue and other material debrided: Non-Viable, Slough, Skin: Epidermis, Slough Level: Skin/Epidermis Debridement Description: Selective/Open Wound Instrument: Curette Bleeding: Minimum Hemostasis Achieved: Pressure Response to Treatment: Procedure was tolerated well Level of Consciousness (Post- Awake and Alert procedure): Post Debridement Measurements of Total Wound Length: (cm) 1.9 Stage: Category/Stage III Width: (cm) 0.8 Depth: (cm) 0.1 Volume: (cm) 0.119 Character of Wound/Ulcer Post Debridement: Improved Post Procedure Diagnosis Same as Pre-procedure Electronic Signature(s) Signed: 07/01/2023 4:42:34 PM By: Baltazar Najjar MD Entered By: Baltazar Najjar on 07/01/2023 11:53:53 -------------------------------------------------------------------------------- HPI Details Patient Name: Date of Service: [name], A DA M [name]. 07/01/2023 1:30 PM Medical Record Number: [id number] Patient Account Number: [id number] Date of Birth/Sex: Treating RN: 1961/07/03 (62 y.o. M) Primary Care Provider: [facility] Other Clinician: Referring Provider: Treating  Provider/Extender: Milas Hock, [place] in Treatment: 6 History of Present Illness HPI Description: 05/18/2023 [name] is a 62 year old male with a past medical history of multiple sclerosis and neurogenic bladder with chronic indwelling Foley catheter that presents to the clinic for a 1 year history of nonhealing ulcers to the bilateral ischium As well as upper back. He has an air mattress. He spends about 50% of the time in the bed and 50% of the time in the wheelchair. He does not have a Roho cushion. He has been using Medihoney and Santyl to the wound beds. He [name], [name] ([id number]) [id number] Page 2 of 8 currently denies systemic signs of infection. Of note he was hospitalized on 2 occasions in January 2024 for trigeminal neuralgia. He has lost a significant amount of weight due to this and his oral intake has declined. He was evaluated for PEG tube but was not a candidate. 8/19; patient presents for follow-up. Patient has home health. He has been using Dakin's wet-to-dry dressings to the ischial wounds and Santyl to the back wound. Patient was recently hospitalized for UTI and received IV Zosyn for 3 days followed by fosfomycin. He completed 6 days of antibiotics. He currently denies systemic signs of infection. He also had a portable pelvic x-ray that did not show any obvious erosive changes. Wife is presentAnd she states she is trying to aggressively offload the wound beds by repositioning him on his sides. Unfortunately he has developed two wounds 1 to the right buttocks and 1 to the midline back. 9/5; patient presents for follow-up. Wife is present. She has been using Dakin's wet-to-dry dressings to the ischial wounds and Santyl to the back wounds and right buttocks wound. Overall wounds have improved in appearance and size since last clinic visit. 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 He is here with his wife. 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

## 2023-07-08 ENCOUNTER — Encounter: Payer: Self-pay | Admitting: Neurology

## 2023-07-08 MED ORDER — MODAFINIL 100 MG PO TABS: 100.0000 mg | ORAL_TABLET | Freq: Every morning | ORAL | 0 refills | Status: DC

## 2023-07-08 NOTE — Telephone Encounter (Signed)
Last seen on 06/15/23 Follow up scheduled on 12/28/23 Rx pending to be signed

## 2023-07-15 ENCOUNTER — Encounter (HOSPITAL_BASED_OUTPATIENT_CLINIC_OR_DEPARTMENT_OTHER): Payer: No Typology Code available for payment source | Attending: Internal Medicine | Admitting: Internal Medicine

## 2023-07-15 DIAGNOSIS — L89103 Pressure ulcer of unspecified part of back, stage 3: Secondary | ICD-10-CM | POA: Diagnosis present

## 2023-07-15 DIAGNOSIS — J45909 Unspecified asthma, uncomplicated: Secondary | ICD-10-CM | POA: Insufficient documentation

## 2023-07-15 DIAGNOSIS — L89102 Pressure ulcer of unspecified part of back, stage 2: Secondary | ICD-10-CM | POA: Insufficient documentation

## 2023-07-15 DIAGNOSIS — L89312 Pressure ulcer of right buttock, stage 2: Secondary | ICD-10-CM | POA: Insufficient documentation

## 2023-07-15 DIAGNOSIS — E440 Moderate protein-calorie malnutrition: Secondary | ICD-10-CM | POA: Insufficient documentation

## 2023-07-15 DIAGNOSIS — Z87440 Personal history of urinary (tract) infections: Secondary | ICD-10-CM | POA: Diagnosis not present

## 2023-07-15 DIAGNOSIS — L89314 Pressure ulcer of right buttock, stage 4: Secondary | ICD-10-CM | POA: Diagnosis not present

## 2023-07-15 DIAGNOSIS — N319 Neuromuscular dysfunction of bladder, unspecified: Secondary | ICD-10-CM | POA: Insufficient documentation

## 2023-07-15 DIAGNOSIS — G35 Multiple sclerosis: Secondary | ICD-10-CM | POA: Insufficient documentation

## 2023-07-15 DIAGNOSIS — L89324 Pressure ulcer of left buttock, stage 4: Secondary | ICD-10-CM | POA: Diagnosis not present

## 2023-07-16 NOTE — Progress Notes (Signed)
Secondary Dressing: Zetuvit Plus Silicone Border Dressing 4x4 (in/in) (Home Health) 1 x Per Day/30 Days Discharge Instructions: Apply silicone border over primary dressing as directed. WOUND #3: - Gluteus Wound Laterality: Left Cleanser: Vashe 5.8 (oz) (Home Health) (Generic) 1 x Per Day/30 Days Discharge Instructions: Cleanse the wound with Vashe prior to applying Mark clean dressing using gauze sponges, not tissue or cotton balls. Prim Dressing: Dakin's Solution 0.25%, 16 (oz) (Home Health) 1 x Per Day/30 Days ary Discharge Instructions: Moisten gauze with Dakin's solution Secondary Dressing: Zetuvit Plus Silicone Border Dressing 7x7(in/in) (Home Health) 1 x Per Day/30 Days Discharge Instructions: Apply silicone border over primary dressing as directed. WOUND #4: - Gluteal fold Wound Laterality: Right Cleanser: Vashe 5.8 (oz) (Home Health) (Generic) 1 x Per Day/30 Days Discharge Instructions: Cleanse the wound with Vashe prior to applying Mark clean dressing using gauze sponges, not tissue or cotton balls. Prim Dressing: Dakin's Solution 0.25%, 16 (oz) (Home Health) 1 x Per Day/30 Days ary Discharge Instructions: Moisten gauze with Dakin's solution Secondary Dressing: Zetuvit Plus Silicone Border Dressing 7x7(in/in) (Home Health) 1 x Per Day/30 Days Discharge Instructions: Apply silicone border over primary dressing as directed. 1. I am continuing the Vashe wet-to-dry. His wife feels that this has been most effective. The all option would be Mark silver collagen with the backing wet to dry. I will leave this in reserve. I do not think wound vacs could be bridged to this area 2. The area in the thoracic spine area appears closer to epithelialized. He may require further debridement I did not do this today Electronic Signature(s) Signed: 07/15/2023 5:27:16 PM By: Baltazar Najjar MD Entered By: Baltazar Najjar on 07/15/2023 12:39:02 [name], [name] ([id number])  [id number] Page 6 of 6 -------------------------------------------------------------------------------- SuperBill Details Patient Name: Date of Service: [name] 07/15/2023 Medical Record Number: [id number] Patient Account Number: [id number] Date of Birth/Sex: Treating RN: 24-Jun-1961 (62 y.o. [name] Primary Care Provider: [facility] Other Clinician: Referring Provider: Treating Provider/Extender: [facility], [place] in Treatment: 8 Diagnosis Coding ICD-10 Codes Code Description L89.314 Pressure ulcer of right buttock, stage 4 L89.324 Pressure ulcer of left buttock, stage 4 L89.312 Pressure ulcer of right buttock, stage 2 L89.103 Pressure ulcer of unspecified part of back, stage 3 L89.102 Pressure ulcer of unspecified part of back, stage 2 E44.0 Moderate protein-calorie malnutrition G35 Multiple sclerosis N31.9 Neuromuscular dysfunction of bladder, unspecified Facility Procedures : CPT4 Code: 53664403 Description: 47425 - WOUND CARE VISIT-LEV 5 EST PT Modifier: Quantity: 1 Physician Procedures : CPT4 Code Description Modifier [id number] - WC PHYS LEVEL 3 - EST PT ICD-10 Diagnosis Description L89.314 Pressure ulcer of right buttock, stage 4 L89.324 Pressure ulcer of left buttock, stage 4 L89.102 Pressure ulcer of unspecified part of back,  stage 2 Quantity: 1 Electronic Signature(s) Signed: 07/15/2023 5:27:16 PM By: Baltazar Najjar MD Previous Signature: 07/15/2023 12:34:57 PM Version By: Karie Schwalbe RN Entered By: Baltazar Najjar on 07/15/2023 12:39:31  Secondary Dressing: Zetuvit Plus Silicone Border Dressing 4x4 (in/in) (Home Health) 1 x Per Day/30 Days Discharge Instructions: Apply silicone border over primary dressing as directed. WOUND #3: - Gluteus Wound Laterality: Left Cleanser: Vashe 5.8 (oz) (Home Health) (Generic) 1 x Per Day/30 Days Discharge Instructions: Cleanse the wound with Vashe prior to applying Mark clean dressing using gauze sponges, not tissue or cotton balls. Prim Dressing: Dakin's Solution 0.25%, 16 (oz) (Home Health) 1 x Per Day/30 Days ary Discharge Instructions: Moisten gauze with Dakin's solution Secondary Dressing: Zetuvit Plus Silicone Border Dressing 7x7(in/in) (Home Health) 1 x Per Day/30 Days Discharge Instructions: Apply silicone border over primary dressing as directed. WOUND #4: - Gluteal fold Wound Laterality: Right Cleanser: Vashe 5.8 (oz) (Home Health) (Generic) 1 x Per Day/30 Days Discharge Instructions: Cleanse the wound with Vashe prior to applying Mark clean dressing using gauze sponges, not tissue or cotton balls. Prim Dressing: Dakin's Solution 0.25%, 16 (oz) (Home Health) 1 x Per Day/30 Days ary Discharge Instructions: Moisten gauze with Dakin's solution Secondary Dressing: Zetuvit Plus Silicone Border Dressing 7x7(in/in) (Home Health) 1 x Per Day/30 Days Discharge Instructions: Apply silicone border over primary dressing as directed. 1. I am continuing the Vashe wet-to-dry. His wife feels that this has been most effective. The all option would be Mark silver collagen with the backing wet to dry. I will leave this in reserve. I do not think wound vacs could be bridged to this area 2. The area in the thoracic spine area appears closer to epithelialized. He may require further debridement I did not do this today Electronic Signature(s) Signed: 07/15/2023 5:27:16 PM By: Baltazar Najjar MD Entered By: Baltazar Najjar on 07/15/2023 12:39:02 [name], [name] ([id number])  [id number] Page 6 of 6 -------------------------------------------------------------------------------- SuperBill Details Patient Name: Date of Service: [name] 07/15/2023 Medical Record Number: [id number] Patient Account Number: [id number] Date of Birth/Sex: Treating RN: 24-Jun-1961 (62 y.o. [name] Primary Care Provider: [facility] Other Clinician: Referring Provider: Treating Provider/Extender: [facility], [place] in Treatment: 8 Diagnosis Coding ICD-10 Codes Code Description L89.314 Pressure ulcer of right buttock, stage 4 L89.324 Pressure ulcer of left buttock, stage 4 L89.312 Pressure ulcer of right buttock, stage 2 L89.103 Pressure ulcer of unspecified part of back, stage 3 L89.102 Pressure ulcer of unspecified part of back, stage 2 E44.0 Moderate protein-calorie malnutrition G35 Multiple sclerosis N31.9 Neuromuscular dysfunction of bladder, unspecified Facility Procedures : CPT4 Code: 53664403 Description: 47425 - WOUND CARE VISIT-LEV 5 EST PT Modifier: Quantity: 1 Physician Procedures : CPT4 Code Description Modifier [id number] - WC PHYS LEVEL 3 - EST PT ICD-10 Diagnosis Description L89.314 Pressure ulcer of right buttock, stage 4 L89.324 Pressure ulcer of left buttock, stage 4 L89.102 Pressure ulcer of unspecified part of back,  stage 2 Quantity: 1 Electronic Signature(s) Signed: 07/15/2023 5:27:16 PM By: Baltazar Najjar MD Previous Signature: 07/15/2023 12:34:57 PM Version By: Karie Schwalbe RN Entered By: Baltazar Najjar on 07/15/2023 12:39:31  Secondary Dressing: Zetuvit Plus Silicone Border Dressing 4x4 (in/in) (Home Health) 1 x Per Day/30 Days Discharge Instructions: Apply silicone border over primary dressing as directed. WOUND #3: - Gluteus Wound Laterality: Left Cleanser: Vashe 5.8 (oz) (Home Health) (Generic) 1 x Per Day/30 Days Discharge Instructions: Cleanse the wound with Vashe prior to applying Mark clean dressing using gauze sponges, not tissue or cotton balls. Prim Dressing: Dakin's Solution 0.25%, 16 (oz) (Home Health) 1 x Per Day/30 Days ary Discharge Instructions: Moisten gauze with Dakin's solution Secondary Dressing: Zetuvit Plus Silicone Border Dressing 7x7(in/in) (Home Health) 1 x Per Day/30 Days Discharge Instructions: Apply silicone border over primary dressing as directed. WOUND #4: - Gluteal fold Wound Laterality: Right Cleanser: Vashe 5.8 (oz) (Home Health) (Generic) 1 x Per Day/30 Days Discharge Instructions: Cleanse the wound with Vashe prior to applying Mark clean dressing using gauze sponges, not tissue or cotton balls. Prim Dressing: Dakin's Solution 0.25%, 16 (oz) (Home Health) 1 x Per Day/30 Days ary Discharge Instructions: Moisten gauze with Dakin's solution Secondary Dressing: Zetuvit Plus Silicone Border Dressing 7x7(in/in) (Home Health) 1 x Per Day/30 Days Discharge Instructions: Apply silicone border over primary dressing as directed. 1. I am continuing the Vashe wet-to-dry. His wife feels that this has been most effective. The all option would be Mark silver collagen with the backing wet to dry. I will leave this in reserve. I do not think wound vacs could be bridged to this area 2. The area in the thoracic spine area appears closer to epithelialized. He may require further debridement I did not do this today Electronic Signature(s) Signed: 07/15/2023 5:27:16 PM By: Baltazar Najjar MD Entered By: Baltazar Najjar on 07/15/2023 12:39:02 [name], [name] ([id number])  [id number] Page 6 of 6 -------------------------------------------------------------------------------- SuperBill Details Patient Name: Date of Service: [name] 07/15/2023 Medical Record Number: [id number] Patient Account Number: [id number] Date of Birth/Sex: Treating RN: 24-Jun-1961 (62 y.o. [name] Primary Care Provider: [facility] Other Clinician: Referring Provider: Treating Provider/Extender: [facility], [place] in Treatment: 8 Diagnosis Coding ICD-10 Codes Code Description L89.314 Pressure ulcer of right buttock, stage 4 L89.324 Pressure ulcer of left buttock, stage 4 L89.312 Pressure ulcer of right buttock, stage 2 L89.103 Pressure ulcer of unspecified part of back, stage 3 L89.102 Pressure ulcer of unspecified part of back, stage 2 E44.0 Moderate protein-calorie malnutrition G35 Multiple sclerosis N31.9 Neuromuscular dysfunction of bladder, unspecified Facility Procedures : CPT4 Code: 53664403 Description: 47425 - WOUND CARE VISIT-LEV 5 EST PT Modifier: Quantity: 1 Physician Procedures : CPT4 Code Description Modifier [id number] 99213 - WC PHYS LEVEL 3 - EST PT ICD-10 Diagnosis Description L89.314 Pressure ulcer of right buttock, stage 4 L89.324 Pressure ulcer of left buttock, stage 4 L89.102 Pressure ulcer of unspecified part of back,  stage 2 Quantity: 1 Electronic Signature(s) Signed: 07/15/2023 5:27:16 PM By: Baltazar Najjar MD Previous Signature: 07/15/2023 12:34:57 PM Version By: Karie Schwalbe RN Entered By: Baltazar Najjar on 07/15/2023 12:39:31  [name] ([id number]) [id number] Page 1 of 6 Visit Report for 07/15/2023 HPI Details Patient Name: Date of Service: [name]. 07/15/2023 10:30 Mark M Medical Record Number: [id number] Patient Account Number: [id number] Date of Birth/Sex: Treating RN: [birth year] (62 y.o. M) Primary Care Provider: [facility] Other Clinician: Referring Provider: Treating Provider/Extender: Milas Hock, [place] in Treatment: 8 History of Present Illness HPI Description: 05/18/2023 Mr. Avary Pitsenbarger is Mark 62 year old male with Mark past medical history of multiple sclerosis and neurogenic bladder with chronic indwelling Foley catheter that presents to the clinic for Mark 1 year history of nonhealing ulcers to the bilateral ischium As well as upper back. He has an air mattress. He spends about 50% of the time in the bed and 50% of the time in the wheelchair. He does not have Mark Roho cushion. He has been using Medihoney and Santyl to the wound beds. He currently denies systemic signs of infection. Of note he was hospitalized on 2 occasions in January 2024 for trigeminal neuralgia. He has lost Mark significant amount of weight due to this and his oral intake has declined. He was evaluated for PEG tube but was not Mark candidate. 8/19; patient presents for follow-up. Patient has home health. He has been using Dakin's wet-to-dry dressings to the ischial wounds and Santyl to the back wound. Patient was recently hospitalized for UTI and received IV Zosyn for 3 days followed by fosfomycin. He completed 6 days of antibiotics. He currently denies systemic signs of infection. He also had Mark portable pelvic x-ray that did not show any obvious erosive changes. Wife is presentAnd she states she is trying to aggressively offload the wound beds by repositioning him on his sides. Unfortunately he has developed two wounds 1 to the right buttocks and 1 to the midline back. 9/5;  patient presents for follow-up. Wife is present. She has been using Dakin's wet-to-dry dressings to the ischial wounds and Santyl to the back wounds and right buttocks wound. Overall wounds have improved in appearance and size since last clinic visit. 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 He is here with his wife. 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 His wife tells me she gets him up in the wheelchair at about 7:00 in the morning and he is there till 5:00 at night when they put him to bed. He is apparently eating and drinking well. We have been using Dakin's wet to dry did the stage IV wounds on his ischial tuberosities and Santyl with Hydrofera Blue to Mark small area on his back at roughly T4 10/3 patient with advanced multiple sclerosis. He has stage IV wounds over both ischial tuberosities and more superficial area on his back. He has been using Dakin's wet-to-dry on the ischial tuberosity wounds Santyl with Hydrofera Blue to the wound on his back Electronic Signature(s) Signed: 07/15/2023 5:27:16 PM By: Baltazar Najjar MD Entered By: Baltazar Najjar on 07/15/2023 12:36:57 -------------------------------------------------------------------------------- Physical Exam Details Patient Name: Date of Service: [name], [name]. 07/15/2023 10:30 Mark M Medical Record Number: [id number] Patient Account Number: [id number] Date of Birth/Sex: Treating RN: 1961-07-01 (63 y.o. M) Primary Care Provider: [facility] Other Clinician: Referring Provider: Treating Provider/Extender: Kathaleen Grinder in Treatment: 8 Constitutional Sitting or standing Blood Pressure is  0.25%, 16 (oz)  (Home Health) 1 x Per Day/30 Days ary Discharge Instructions: Moisten gauze with Dakin's solution [name], Myrick [name] ([id number]) [id number] Page 3 of 6 Secondary Dressing: Zetuvit Plus Silicone Border Dressing 7x7(in/in) (Home Health) 1 x Per Day/30 Days Discharge Instructions: Apply silicone border over primary dressing as directed. Electronic Signature(s) Signed: 07/15/2023 12:34:57 PM By: Karie Schwalbe RN Signed: 07/15/2023 5:27:16 PM By: Baltazar Najjar MD Entered By: Karie Schwalbe on 07/15/2023 08:37:54 -------------------------------------------------------------------------------- Problem List Details Patient Name: Date of Service: [name], [name]. 07/15/2023 10:30 Mark M Medical Record Number: [id number] Patient Account Number: [id number] Date of Birth/Sex: Treating RN: Mar 04, 1961 (62 y.o. M) Primary Care Provider: [facility] Other Clinician: Referring Provider: Treating Provider/Extender: [facility], [place] in Treatment: 8 Active Problems ICD-10 Encounter Code Description Active Date MDM Diagnosis L89.314 Pressure ulcer of right buttock, stage 4 05/18/2023 No Yes L89.324 Pressure ulcer of left buttock, stage 4 05/18/2023 No Yes L89.312 Pressure ulcer of right buttock, stage 2 05/31/2023 No Yes L89.103 Pressure ulcer of unspecified part of back, stage 3 05/18/2023 No Yes L89.102 Pressure ulcer of unspecified part of back, stage 2 05/31/2023 No Yes E44.0 Moderate protein-calorie malnutrition 05/18/2023 No Yes G35 Multiple sclerosis 05/18/2023 No Yes N31.9 Neuromuscular dysfunction of bladder, unspecified 05/18/2023 No Yes Inactive Problems Resolved Problems Electronic Signature(s) Signed: 07/15/2023 5:27:16 PM By: Baltazar Najjar MD Entered By: Baltazar Najjar on 07/15/2023 12:33:39 [name], [name] ([id number]) [id number] Page 4 of  6 -------------------------------------------------------------------------------- Progress Note Details Patient Name: Date of Service: Mark Mirza [name]. 07/15/2023 10:30 Mark M Medical Record Number: [id number] Patient Account Number: [id number] Date of Birth/Sex: Treating RN: [birth year] (62 y.o. M) Primary Care Provider: [facility] Other Clinician: Referring Provider: Treating Provider/Extender: [facility], [place] in Treatment: 8 Subjective History of Present Illness (HPI) 05/18/2023 Mr. Musab Wingard is Mark 62 year old male with Mark past medical history of multiple sclerosis and neurogenic bladder with chronic indwelling Foley catheter that presents to the clinic for Mark 1 year history of nonhealing ulcers to the bilateral ischium As well as upper back. He has an air mattress. He spends about 50% of the time in the bed and 50% of the time in the wheelchair. He does not have Mark Roho cushion. He has been using Medihoney and Santyl to the wound beds. He currently denies systemic signs of infection. Of note he was hospitalized on 2 occasions in January 2024 for trigeminal neuralgia. He has lost Mark significant amount of weight due to this and his oral intake has declined. He was evaluated for PEG tube but was not Mark candidate. 8/19; patient presents for follow-up. Patient has home health. He has been using Dakin's wet-to-dry dressings to the ischial wounds and Santyl to the back wound. Patient was recently hospitalized for UTI and received IV Zosyn for 3 days followed by fosfomycin. He completed 6 days of antibiotics. He currently denies systemic signs of infection. He also had Mark portable pelvic x-ray that did not show any obvious erosive changes. Wife is presentAnd she states she is trying to aggressively offload the wound beds by repositioning him on his sides. Unfortunately he has developed two wounds 1 to the right buttocks and 1 to the midline back. 9/5; patient  presents for follow-up. Wife is present. She has been using Dakin's wet-to-dry dressings to the ischial wounds and Santyl to the back wounds and right buttocks wound. Overall wounds have improved in appearance and size since  0.25%, 16 (oz)  (Home Health) 1 x Per Day/30 Days ary Discharge Instructions: Moisten gauze with Dakin's solution [name], Myrick [name] ([id number]) [id number] Page 3 of 6 Secondary Dressing: Zetuvit Plus Silicone Border Dressing 7x7(in/in) (Home Health) 1 x Per Day/30 Days Discharge Instructions: Apply silicone border over primary dressing as directed. Electronic Signature(s) Signed: 07/15/2023 12:34:57 PM By: Karie Schwalbe RN Signed: 07/15/2023 5:27:16 PM By: Baltazar Najjar MD Entered By: Karie Schwalbe on 07/15/2023 08:37:54 -------------------------------------------------------------------------------- Problem List Details Patient Name: Date of Service: [name], [name]. 07/15/2023 10:30 Mark M Medical Record Number: [id number] Patient Account Number: [id number] Date of Birth/Sex: Treating RN: Mar 04, 1961 (62 y.o. M) Primary Care Provider: [facility] Other Clinician: Referring Provider: Treating Provider/Extender: [facility], [place] in Treatment: 8 Active Problems ICD-10 Encounter Code Description Active Date MDM Diagnosis L89.314 Pressure ulcer of right buttock, stage 4 05/18/2023 No Yes L89.324 Pressure ulcer of left buttock, stage 4 05/18/2023 No Yes L89.312 Pressure ulcer of right buttock, stage 2 05/31/2023 No Yes L89.103 Pressure ulcer of unspecified part of back, stage 3 05/18/2023 No Yes L89.102 Pressure ulcer of unspecified part of back, stage 2 05/31/2023 No Yes E44.0 Moderate protein-calorie malnutrition 05/18/2023 No Yes G35 Multiple sclerosis 05/18/2023 No Yes N31.9 Neuromuscular dysfunction of bladder, unspecified 05/18/2023 No Yes Inactive Problems Resolved Problems Electronic Signature(s) Signed: 07/15/2023 5:27:16 PM By: Baltazar Najjar MD Entered By: Baltazar Najjar on 07/15/2023 12:33:39 [name], [name] ([id number]) [id number] Page 4 of  6 -------------------------------------------------------------------------------- Progress Note Details Patient Name: Date of Service: Mark Mirza [name]. 07/15/2023 10:30 Mark M Medical Record Number: [id number] Patient Account Number: [id number] Date of Birth/Sex: Treating RN: [birth year] (62 y.o. M) Primary Care Provider: [facility] Other Clinician: Referring Provider: Treating Provider/Extender: [facility], [place] in Treatment: 8 Subjective History of Present Illness (HPI) 05/18/2023 Mr. Musab Wingard is Mark 62 year old male with Mark past medical history of multiple sclerosis and neurogenic bladder with chronic indwelling Foley catheter that presents to the clinic for Mark 1 year history of nonhealing ulcers to the bilateral ischium As well as upper back. He has an air mattress. He spends about 50% of the time in the bed and 50% of the time in the wheelchair. He does not have Mark Roho cushion. He has been using Medihoney and Santyl to the wound beds. He currently denies systemic signs of infection. Of note he was hospitalized on 2 occasions in January 2024 for trigeminal neuralgia. He has lost Mark significant amount of weight due to this and his oral intake has declined. He was evaluated for PEG tube but was not Mark candidate. 8/19; patient presents for follow-up. Patient has home health. He has been using Dakin's wet-to-dry dressings to the ischial wounds and Santyl to the back wound. Patient was recently hospitalized for UTI and received IV Zosyn for 3 days followed by fosfomycin. He completed 6 days of antibiotics. He currently denies systemic signs of infection. He also had Mark portable pelvic x-ray that did not show any obvious erosive changes. Wife is presentAnd she states she is trying to aggressively offload the wound beds by repositioning him on his sides. Unfortunately he has developed two wounds 1 to the right buttocks and 1 to the midline back. 9/5; patient  presents for follow-up. Wife is present. She has been using Dakin's wet-to-dry dressings to the ischial wounds and Santyl to the back wounds and right buttocks wound. Overall wounds have improved in appearance and size since

## 2023-07-16 NOTE — Progress Notes (Signed)
[name] ([id number]) [id number] Page 1 of 14 Visit Report for 07/15/2023 Arrival Information Details Patient Name: Date of Service: [name]. 07/15/2023 10:30 Mark M Medical Record Number: [id number] Patient Account Number: [id number] Date of Birth/Sex: Treating RN: June 18, 1961 (62 y.o. [name] Primary Care Rodd Heft: [facility] Other Clinician: Referring Kobyn Kray: Treating Stacie Knutzen/Extender: [facility] [place] in Treatment: 8 Visit Information History Since Last Visit Added or deleted any medications: No Patient Arrived: Wheel Chair Any new allergies or adverse reactions: No Arrival Time: 10:52 Had Mark fall or experienced change in No Accompanied By: wife activities of daily living that may affect Transfer Assistance: Michiel Sites Lift risk of falls: Patient Identification Verified: Yes Signs or symptoms of abuse/neglect since last visito No Patient Requires Transmission-Based Precautions: No Hospitalized since last visit: No Patient Has Alerts: No Implantable device outside of the clinic excluding No cellular tissue based products placed in the center since last visit: Has Dressing in Place as Prescribed: Yes Pain Present Now: No Electronic Signature(s) Signed: 07/15/2023 12:34:57 PM By: Karie Schwalbe RN Entered By: Karie Schwalbe on 07/15/2023 08:32:18 -------------------------------------------------------------------------------- Clinic Level of Care Assessment Details Patient Name: Date of Service: [name]. 07/15/2023 10:30 Mark M Medical Record Number: [id number] Patient Account Number: [id number] Date of Birth/Sex: Treating RN: 11-13-1960 (62 y.o. [name] Primary Care Jola Critzer: [facility] Other Clinician: Referring Montia Haslip: Treating Sophi Calligan/Extender: [facility] [place] in Treatment: 8 Clinic Level of Care Assessment Items TOOL 4  Quantity Score X- 1 0 Use when only an EandM is performed on FOLLOW-UP visit ASSESSMENTS - Nursing Assessment / Reassessment X- 1 10 Reassessment of Co-morbidities (includes updates in patient status) X- 1 5 Reassessment of Adherence to Treatment Plan ASSESSMENTS - Wound and Skin Mark ssessment / Reassessment []  - 0 Simple Wound Assessment / Reassessment - one wound X- 4 5 Complex Wound Assessment / Reassessment - multiple wounds []  - 0 Dermatologic / Skin Assessment (not related to wound area) ASSESSMENTS - Focused Assessment []  - 0 Circumferential Edema Measurements - multi extremities []  - 0 Nutritional Assessment / Counseling / Intervention [name] ([id number]) [id number] Page 2 of 14 []  - 0 Lower Extremity Assessment (monofilament, tuning fork, pulses) []  - 0 Peripheral Arterial Disease Assessment (using hand held doppler) ASSESSMENTS - Ostomy and/or Continence Assessment and Care []  - 0 Incontinence Assessment and Management []  - 0 Ostomy Care Assessment and Management (repouching, etc.) PROCESS - Coordination of Care []  - 0 Simple Patient / Family Education for ongoing care X- 1 20 Complex (extensive) Patient / Family Education for ongoing care X- 1 10 Staff obtains Chiropractor, Records, T Results / Process Orders est X- 1 10 Staff telephones HHA, Nursing Homes / Clarify orders / etc []  - 0 Routine Transfer to another Facility (non-emergent condition) []  - 0 Routine Hospital Admission (non-emergent condition) []  - 0 New Admissions / Manufacturing engineer / Ordering NPWT Apligraf, etc. , []  - 0 Emergency Hospital Admission (emergent condition) []  - 0 Simple Discharge Coordination X- 1 15 Complex (extensive) Discharge Coordination PROCESS - Special Needs []  - 0 Pediatric / Minor Patient Management []  - 0 Isolation Patient Management []  - 0 Hearing / Language / Visual special needs []  - 0 Assessment of Community  assistance (transportation, D/C planning, etc.) []  - 0 Additional assistance / Altered mentation []  - 0 Support Surface(s) Assessment (bed, cushion, seat, etc.) INTERVENTIONS - Wound Cleansing / Measurement []  - 0  Page 9 of 14 Wound Measurements Length: (cm) 1.7 Width: (cm) 0.5 Depth: (cm) 0.1 Area: (cm) 0.668 Volume: (cm) 0.067 % Reduction in Area: 69.1% % Reduction in Volume: 84.5% Epithelialization: Small (1-33%) Tunneling: No Undermining: No Wound Description Classification: Category/Stage III Wound Margin: Distinct, outline attached Exudate Amount: Medium Exudate Type: Serosanguineous Exudate Color: red, brown Foul Odor After Cleansing: No Slough/Fibrino Yes Wound Bed Granulation Amount: Small (1-33%) Exposed Structure Granulation Quality: Red Fascia Exposed: No Necrotic Amount: Large (67-100%) Fat Layer (Subcutaneous Tissue) Exposed: Yes Necrotic Quality: Adherent Slough Tendon Exposed: No Muscle Exposed: No Joint Exposed: No Bone Exposed: No Periwound Skin Texture Texture Color No Abnormalities Noted: No No Abnormalities Noted: No Callus: No Atrophie Blanche: No Crepitus: No Cyanosis: No Excoriation: No Ecchymosis: No Induration: No Erythema: No Rash: No Hemosiderin Staining: No Scarring: No Mottled: No Pallor: No Moisture Rubor: No No Abnormalities Noted: No Dry / Scaly: No Temperature / Pain Maceration: No Temperature: No Abnormality Treatment Notes Wound #2 (Back) Wound Laterality: Medial Cleanser Vashe 5.8 (oz) Discharge Instruction: Cleanse the wound with Vashe prior to applying Mark clean dressing using gauze sponges, not tissue or cotton balls. Peri-Wound Care Skin Prep Discharge Instruction: Use skin prep as directed Topical Santyl Collagenase Ointment, 30 (gm), tube Primary Dressing Hydrofera Blue Ready Transfer Foam, 4x5 (in/in) Discharge Instruction: Apply to wound bed as instructed Secondary Dressing Zetuvit Plus Silicone Border Dressing 4x4 (in/in) Discharge Instruction: Apply silicone border over primary dressing as directed. Secured With Compression  Wrap Compression Stockings Facilities manager) Signed: 07/15/2023 3:10:19 PM By: Karie Schwalbe RN Signed: 07/15/2023 3:52:59 PM By: Thayer Dallas Entered By: Thayer Dallas on 07/15/2023 08:05:16 [name], [name] ([id number]) [id number] Page 10 of 14 -------------------------------------------------------------------------------- Wound Assessment Details Patient Name: Date of Service: [name]. 07/15/2023 10:30 Mark M Medical Record Number: [id number] Patient Account Number: [id number] Date of Birth/Sex: Treating RN: 07-22-1961 (62 y.o. M) Primary Care Hannan Hutmacher: [facility] Other Clinician: Referring Monroe Toure: Treating Avontae Burkhead/Extender: [facility] [place] in Treatment: 8 Wound Status Wound Number: 3 Primary Etiology: Pressure Ulcer Wound Location: Left Gluteus Wound Status: Open Wounding Event: Gradually Appeared Comorbid History: Asthma Date Acquired: 05/14/2022 Weeks Of Treatment: 8 Clustered Wound: No Photos Wound Measurements Length: (cm) 6 Width: (cm) 2.4 Depth: (cm) 1.5 Area: (cm) 11.31 Volume: (cm) 16.965 % Reduction in Area: 12.7% % Reduction in Volume: 62.6% Tunneling: Yes Position (o'clock): 10 Maximum Distance: (cm) 1.8 Undermining: Yes Starting Position (o'clock): 1 Ending Position (o'clock): 3 Maximum Distance: (cm) 3.4 Wound Description Classification: Category/Stage IV Wound Margin: Distinct, outline attached Exudate Amount: Medium Exudate Type: Serosanguineous Exudate Color: red, brown Foul Odor After Cleansing: No Slough/Fibrino No Wound Bed Granulation Amount: Medium (34-66%) Exposed Structure Granulation Quality: Red, Pink Fat Layer (Subcutaneous Tissue) Exposed: Yes Necrotic Amount: Medium (34-66%) Muscle Exposed: Yes Necrotic Quality: Adherent Slough Necrosis of Muscle: No Bone Exposed: Yes Periwound Skin Texture Texture Color No Abnormalities Noted:  No No Abnormalities Noted: No Scarring: Yes Hemosiderin Staining: Yes Moisture Temperature / Pain No Abnormalities Noted: No Temperature: No Abnormality Dry / Scaly: No Maceration: No [name], [name] ([id number]) [id number] Page 11 of 14 Treatment Notes Wound #3 (Gluteus) Wound Laterality: Left Cleanser Vashe 5.8 (oz) Discharge Instruction: Cleanse the wound with Vashe prior to applying Mark clean dressing using gauze sponges, not tissue or cotton balls. Peri-Wound Care Topical Primary Dressing Dakin's Solution 0.25%, 16 (oz) Discharge Instruction: Moisten gauze with Dakin's solution Secondary Dressing Zetuvit Plus Silicone Border Dressing 7x7(in/in) Discharge  [name] ([id number]) [id number] Page 1 of 14 Visit Report for 07/15/2023 Arrival Information Details Patient Name: Date of Service: [name]. 07/15/2023 10:30 Mark M Medical Record Number: [id number] Patient Account Number: [id number] Date of Birth/Sex: Treating RN: June 18, 1961 (62 y.o. [name] Primary Care Rodd Heft: [facility] Other Clinician: Referring Kobyn Kray: Treating Stacie Knutzen/Extender: [facility] [place] in Treatment: 8 Visit Information History Since Last Visit Added or deleted any medications: No Patient Arrived: Wheel Chair Any new allergies or adverse reactions: No Arrival Time: 10:52 Had Mark fall or experienced change in No Accompanied By: wife activities of daily living that may affect Transfer Assistance: Michiel Sites Lift risk of falls: Patient Identification Verified: Yes Signs or symptoms of abuse/neglect since last visito No Patient Requires Transmission-Based Precautions: No Hospitalized since last visit: No Patient Has Alerts: No Implantable device outside of the clinic excluding No cellular tissue based products placed in the center since last visit: Has Dressing in Place as Prescribed: Yes Pain Present Now: No Electronic Signature(s) Signed: 07/15/2023 12:34:57 PM By: Karie Schwalbe RN Entered By: Karie Schwalbe on 07/15/2023 08:32:18 -------------------------------------------------------------------------------- Clinic Level of Care Assessment Details Patient Name: Date of Service: [name]. 07/15/2023 10:30 Mark M Medical Record Number: [id number] Patient Account Number: [id number] Date of Birth/Sex: Treating RN: 11-13-1960 (62 y.o. [name] Primary Care Jola Critzer: [facility] Other Clinician: Referring Montia Haslip: Treating Sophi Calligan/Extender: [facility] [place] in Treatment: 8 Clinic Level of Care Assessment Items TOOL 4  Quantity Score X- 1 0 Use when only an EandM is performed on FOLLOW-UP visit ASSESSMENTS - Nursing Assessment / Reassessment X- 1 10 Reassessment of Co-morbidities (includes updates in patient status) X- 1 5 Reassessment of Adherence to Treatment Plan ASSESSMENTS - Wound and Skin Mark ssessment / Reassessment []  - 0 Simple Wound Assessment / Reassessment - one wound X- 4 5 Complex Wound Assessment / Reassessment - multiple wounds []  - 0 Dermatologic / Skin Assessment (not related to wound area) ASSESSMENTS - Focused Assessment []  - 0 Circumferential Edema Measurements - multi extremities []  - 0 Nutritional Assessment / Counseling / Intervention [name] ([id number]) [id number] Page 2 of 14 []  - 0 Lower Extremity Assessment (monofilament, tuning fork, pulses) []  - 0 Peripheral Arterial Disease Assessment (using hand held doppler) ASSESSMENTS - Ostomy and/or Continence Assessment and Care []  - 0 Incontinence Assessment and Management []  - 0 Ostomy Care Assessment and Management (repouching, etc.) PROCESS - Coordination of Care []  - 0 Simple Patient / Family Education for ongoing care X- 1 20 Complex (extensive) Patient / Family Education for ongoing care X- 1 10 Staff obtains Chiropractor, Records, T Results / Process Orders est X- 1 10 Staff telephones HHA, Nursing Homes / Clarify orders / etc []  - 0 Routine Transfer to another Facility (non-emergent condition) []  - 0 Routine Hospital Admission (non-emergent condition) []  - 0 New Admissions / Manufacturing engineer / Ordering NPWT Apligraf, etc. , []  - 0 Emergency Hospital Admission (emergent condition) []  - 0 Simple Discharge Coordination X- 1 15 Complex (extensive) Discharge Coordination PROCESS - Special Needs []  - 0 Pediatric / Minor Patient Management []  - 0 Isolation Patient Management []  - 0 Hearing / Language / Visual special needs []  - 0 Assessment of Community  assistance (transportation, D/C planning, etc.) []  - 0 Additional assistance / Altered mentation []  - 0 Support Surface(s) Assessment (bed, cushion, seat, etc.) INTERVENTIONS - Wound Cleansing / Measurement []  - 0  Simple Wound Cleansing - one wound X- 4 5 Complex Wound Cleansing - multiple wounds []  - 0 Wound Imaging (photographs - any number of wounds) []  - 0 Wound Tracing (instead of photographs) []  - 0 Simple Wound Measurement - one wound X- 4 5 Complex Wound Measurement - multiple wounds INTERVENTIONS - Wound Dressings []  - 0 Small Wound Dressing one or multiple wounds X- 4 15 Medium Wound Dressing one or multiple wounds []  - 0 Large Wound Dressing one or multiple wounds []  - 0 Application of Medications - topical []  - 0 Application of Medications - injection INTERVENTIONS - Miscellaneous []  - 0 External ear exam []  - 0 Specimen Collection (cultures, biopsies, blood, body fluids, etc.) []  - 0 Specimen(s) / Culture(s) sent or taken to Lab for analysis []  - 0 Patient Transfer (multiple staff / Nurse, adult / Similar devices) []  - 0 Simple Staple / Suture removal (25 or less) []  - 0 Complex Staple / Suture removal (26 or more) []  - 0 Hypo / Hyperglycemic Management (close monitor of Blood Glucose) [name], [name] ([id number]) [id number] Page 3 of 14 []  - 0 Ankle / Brachial Index (ABI) - do not check if billed separately X- 1 5 Vital Signs Has the patient been seen at the hospital within the last three years: Yes Total Score: 195 Level Of Care: New/Established - Level 5 Electronic Signature(s) Signed: 07/15/2023 12:34:57 PM By: Karie Schwalbe RN Entered By: Karie Schwalbe on 07/15/2023 09:33:43 -------------------------------------------------------------------------------- Encounter Discharge Information Details Patient Name: Date of Service: [name], [name]. 07/15/2023 10:30 Mark M Medical Record Number: [id number] Patient  Account Number: [id number] Date of Birth/Sex: Treating RN: 08/18/1961 (62 y.o. [name] Primary Care Ida Uppal: [facility] Other Clinician: Referring Kmya Placide: Treating Courtney Fenlon/Extender: [facility] [place] in Treatment: 8 Encounter Discharge Information Items Discharge Condition: Stable Ambulatory Status: Wheelchair Discharge Destination: Home Transportation: Private Auto Accompanied By: spouse Schedule Follow-up Appointment: Yes Clinical Summary of Care: Patient Declined Electronic Signature(s) Signed: 07/15/2023 12:34:57 PM By: Karie Schwalbe RN Entered By: Karie Schwalbe on 07/15/2023 09:34:23 -------------------------------------------------------------------------------- Lower Extremity Assessment Details Patient Name: Date of Service: [name], [name]. 07/15/2023 10:30 Mark M Medical Record Number: [id number] Patient Account Number: [id number] Date of Birth/Sex: Treating RN: 1961/08/29 (62 y.o. [name] Primary Care Aahana Elza: [facility] Other Clinician: Referring Donovin Kraemer: Treating Gotti Alwin/Extender: [facility] [place] in Treatment: 8 Electronic Signature(s) Signed: 07/15/2023 12:34:57 PM By: Karie Schwalbe RN Entered By: Karie Schwalbe on 07/15/2023 08:32:50 -------------------------------------------------------------------------------- Multi Wound Chart Details Patient Name: Date of Service: [name], [name]. 07/15/2023 10:30 Mark M Medical Record Number: [id number] Patient Account Number: [id number] [name], [name] ([id number]) [id number] Page 4 of 14 Date of Birth/Sex: Treating RN: July 02, 1961 (62 y.o. M) Primary Care Nochum Fenter: Other Clinician: [facility] Referring Jawad Wiacek: Treating Anikah Hogge/Extender: Kathale[facility] Grinder in Treatment: 8 Vital Signs Height(in): 70 Pulse(bpm): 52 Weight(lbs): 176 Blood  Pressure(mmHg): 108/67 Body Mass Index(BMI): 25.3 Temperature([name]): 97.7 Respiratory Rate(breaths/min): 16 [2:Photos:] Medial Back Left Gluteus Right Gluteal fold Wound Location: Gradually Appeared Gradually Appeared Pressure Injury Wounding Event: Pressure Ulcer Pressure Ulcer Pressure Ulcer Primary Etiology: Asthma Asthma Asthma Comorbid History: 05/14/2022 05/14/2022 05/14/2022 Date Acquired: 8 8 8  Weeks of Treatment: Open Open Open Wound Status: No No No Wound Recurrence: 1.7x0.5x0.1 6x2.4x1.5 3x8.2x1 Measurements L x W x D (cm) 0.668 11.31 19.321 Mark (cm) : rea 0.067 16.965 19.321 Volume (cm) : 69.10% 12.70% 16.70% %  Page 9 of 14 Wound Measurements Length: (cm) 1.7 Width: (cm) 0.5 Depth: (cm) 0.1 Area: (cm) 0.668 Volume: (cm) 0.067 % Reduction in Area: 69.1% % Reduction in Volume: 84.5% Epithelialization: Small (1-33%) Tunneling: No Undermining: No Wound Description Classification: Category/Stage III Wound Margin: Distinct, outline attached Exudate Amount: Medium Exudate Type: Serosanguineous Exudate Color: red, brown Foul Odor After Cleansing: No Slough/Fibrino Yes Wound Bed Granulation Amount: Small (1-33%) Exposed Structure Granulation Quality: Red Fascia Exposed: No Necrotic Amount: Large (67-100%) Fat Layer (Subcutaneous Tissue) Exposed: Yes Necrotic Quality: Adherent Slough Tendon Exposed: No Muscle Exposed: No Joint Exposed: No Bone Exposed: No Periwound Skin Texture Texture Color No Abnormalities Noted: No No Abnormalities Noted: No Callus: No Atrophie Blanche: No Crepitus: No Cyanosis: No Excoriation: No Ecchymosis: No Induration: No Erythema: No Rash: No Hemosiderin Staining: No Scarring: No Mottled: No Pallor: No Moisture Rubor: No No Abnormalities Noted: No Dry / Scaly: No Temperature / Pain Maceration: No Temperature: No Abnormality Treatment Notes Wound #2 (Back) Wound Laterality: Medial Cleanser Vashe 5.8 (oz) Discharge Instruction: Cleanse the wound with Vashe prior to applying Mark clean dressing using gauze sponges, not tissue or cotton balls. Peri-Wound Care Skin Prep Discharge Instruction: Use skin prep as directed Topical Santyl Collagenase Ointment, 30 (gm), tube Primary Dressing Hydrofera Blue Ready Transfer Foam, 4x5 (in/in) Discharge Instruction: Apply to wound bed as instructed Secondary Dressing Zetuvit Plus Silicone Border Dressing 4x4 (in/in) Discharge Instruction: Apply silicone border over primary dressing as directed. Secured With Compression  Wrap Compression Stockings Facilities manager) Signed: 07/15/2023 3:10:19 PM By: Karie Schwalbe RN Signed: 07/15/2023 3:52:59 PM By: Thayer Dallas Entered By: Thayer Dallas on 07/15/2023 08:05:16 [name], [name] ([id number]) [id number] Page 10 of 14 -------------------------------------------------------------------------------- Wound Assessment Details Patient Name: Date of Service: [name]. 07/15/2023 10:30 Mark M Medical Record Number: [id number] Patient Account Number: [id number] Date of Birth/Sex: Treating RN: 07-22-1961 (62 y.o. M) Primary Care Hannan Hutmacher: [facility] Other Clinician: Referring Monroe Toure: Treating Avontae Burkhead/Extender: [facility] [place] in Treatment: 8 Wound Status Wound Number: 3 Primary Etiology: Pressure Ulcer Wound Location: Left Gluteus Wound Status: Open Wounding Event: Gradually Appeared Comorbid History: Asthma Date Acquired: 05/14/2022 Weeks Of Treatment: 8 Clustered Wound: No Photos Wound Measurements Length: (cm) 6 Width: (cm) 2.4 Depth: (cm) 1.5 Area: (cm) 11.31 Volume: (cm) 16.965 % Reduction in Area: 12.7% % Reduction in Volume: 62.6% Tunneling: Yes Position (o'clock): 10 Maximum Distance: (cm) 1.8 Undermining: Yes Starting Position (o'clock): 1 Ending Position (o'clock): 3 Maximum Distance: (cm) 3.4 Wound Description Classification: Category/Stage IV Wound Margin: Distinct, outline attached Exudate Amount: Medium Exudate Type: Serosanguineous Exudate Color: red, brown Foul Odor After Cleansing: No Slough/Fibrino No Wound Bed Granulation Amount: Medium (34-66%) Exposed Structure Granulation Quality: Red, Pink Fat Layer (Subcutaneous Tissue) Exposed: Yes Necrotic Amount: Medium (34-66%) Muscle Exposed: Yes Necrotic Quality: Adherent Slough Necrosis of Muscle: No Bone Exposed: Yes Periwound Skin Texture Texture Color No Abnormalities Noted:  No No Abnormalities Noted: No Scarring: Yes Hemosiderin Staining: Yes Moisture Temperature / Pain No Abnormalities Noted: No Temperature: No Abnormality Dry / Scaly: No Maceration: No [name], [name] ([id number]) [id number] Page 11 of 14 Treatment Notes Wound #3 (Gluteus) Wound Laterality: Left Cleanser Vashe 5.8 (oz) Discharge Instruction: Cleanse the wound with Vashe prior to applying Mark clean dressing using gauze sponges, not tissue or cotton balls. Peri-Wound Care Topical Primary Dressing Dakin's Solution 0.25%, 16 (oz) Discharge Instruction: Moisten gauze with Dakin's solution Secondary Dressing Zetuvit Plus Silicone Border Dressing 7x7(in/in) Discharge  [name] ([id number]) [id number] Page 1 of 14 Visit Report for 07/15/2023 Arrival Information Details Patient Name: Date of Service: [name]. 07/15/2023 10:30 Mark M Medical Record Number: [id number] Patient Account Number: [id number] Date of Birth/Sex: Treating RN: June 18, 1961 (62 y.o. [name] Primary Care Rodd Heft: [facility] Other Clinician: Referring Kobyn Kray: Treating Stacie Knutzen/Extender: [facility] [place] in Treatment: 8 Visit Information History Since Last Visit Added or deleted any medications: No Patient Arrived: Wheel Chair Any new allergies or adverse reactions: No Arrival Time: 10:52 Had Mark fall or experienced change in No Accompanied By: wife activities of daily living that may affect Transfer Assistance: Michiel Sites Lift risk of falls: Patient Identification Verified: Yes Signs or symptoms of abuse/neglect since last visito No Patient Requires Transmission-Based Precautions: No Hospitalized since last visit: No Patient Has Alerts: No Implantable device outside of the clinic excluding No cellular tissue based products placed in the center since last visit: Has Dressing in Place as Prescribed: Yes Pain Present Now: No Electronic Signature(s) Signed: 07/15/2023 12:34:57 PM By: Karie Schwalbe RN Entered By: Karie Schwalbe on 07/15/2023 08:32:18 -------------------------------------------------------------------------------- Clinic Level of Care Assessment Details Patient Name: Date of Service: [name]. 07/15/2023 10:30 Mark M Medical Record Number: [id number] Patient Account Number: [id number] Date of Birth/Sex: Treating RN: 11-13-1960 (62 y.o. [name] Primary Care Jola Critzer: [facility] Other Clinician: Referring Montia Haslip: Treating Sophi Calligan/Extender: [facility] [place] in Treatment: 8 Clinic Level of Care Assessment Items TOOL 4  Quantity Score X- 1 0 Use when only an EandM is performed on FOLLOW-UP visit ASSESSMENTS - Nursing Assessment / Reassessment X- 1 10 Reassessment of Co-morbidities (includes updates in patient status) X- 1 5 Reassessment of Adherence to Treatment Plan ASSESSMENTS - Wound and Skin Mark ssessment / Reassessment []  - 0 Simple Wound Assessment / Reassessment - one wound X- 4 5 Complex Wound Assessment / Reassessment - multiple wounds []  - 0 Dermatologic / Skin Assessment (not related to wound area) ASSESSMENTS - Focused Assessment []  - 0 Circumferential Edema Measurements - multi extremities []  - 0 Nutritional Assessment / Counseling / Intervention [name] ([id number]) [id number] Page 2 of 14 []  - 0 Lower Extremity Assessment (monofilament, tuning fork, pulses) []  - 0 Peripheral Arterial Disease Assessment (using hand held doppler) ASSESSMENTS - Ostomy and/or Continence Assessment and Care []  - 0 Incontinence Assessment and Management []  - 0 Ostomy Care Assessment and Management (repouching, etc.) PROCESS - Coordination of Care []  - 0 Simple Patient / Family Education for ongoing care X- 1 20 Complex (extensive) Patient / Family Education for ongoing care X- 1 10 Staff obtains Chiropractor, Records, T Results / Process Orders est X- 1 10 Staff telephones HHA, Nursing Homes / Clarify orders / etc []  - 0 Routine Transfer to another Facility (non-emergent condition) []  - 0 Routine Hospital Admission (non-emergent condition) []  - 0 New Admissions / Manufacturing engineer / Ordering NPWT Apligraf, etc. , []  - 0 Emergency Hospital Admission (emergent condition) []  - 0 Simple Discharge Coordination X- 1 15 Complex (extensive) Discharge Coordination PROCESS - Special Needs []  - 0 Pediatric / Minor Patient Management []  - 0 Isolation Patient Management []  - 0 Hearing / Language / Visual special needs []  - 0 Assessment of Community  assistance (transportation, D/C planning, etc.) []  - 0 Additional assistance / Altered mentation []  - 0 Support Surface(s) Assessment (bed, cushion, seat, etc.) INTERVENTIONS - Wound Cleansing / Measurement []  - 0  Simple Wound Cleansing - one wound X- 4 5 Complex Wound Cleansing - multiple wounds []  - 0 Wound Imaging (photographs - any number of wounds) []  - 0 Wound Tracing (instead of photographs) []  - 0 Simple Wound Measurement - one wound X- 4 5 Complex Wound Measurement - multiple wounds INTERVENTIONS - Wound Dressings []  - 0 Small Wound Dressing one or multiple wounds X- 4 15 Medium Wound Dressing one or multiple wounds []  - 0 Large Wound Dressing one or multiple wounds []  - 0 Application of Medications - topical []  - 0 Application of Medications - injection INTERVENTIONS - Miscellaneous []  - 0 External ear exam []  - 0 Specimen Collection (cultures, biopsies, blood, body fluids, etc.) []  - 0 Specimen(s) / Culture(s) sent or taken to Lab for analysis []  - 0 Patient Transfer (multiple staff / Nurse, adult / Similar devices) []  - 0 Simple Staple / Suture removal (25 or less) []  - 0 Complex Staple / Suture removal (26 or more) []  - 0 Hypo / Hyperglycemic Management (close monitor of Blood Glucose) [name], [name] ([id number]) [id number] Page 3 of 14 []  - 0 Ankle / Brachial Index (ABI) - do not check if billed separately X- 1 5 Vital Signs Has the patient been seen at the hospital within the last three years: Yes Total Score: 195 Level Of Care: New/Established - Level 5 Electronic Signature(s) Signed: 07/15/2023 12:34:57 PM By: Karie Schwalbe RN Entered By: Karie Schwalbe on 07/15/2023 09:33:43 -------------------------------------------------------------------------------- Encounter Discharge Information Details Patient Name: Date of Service: [name], [name]. 07/15/2023 10:30 Mark M Medical Record Number: [id number] Patient  Account Number: [id number] Date of Birth/Sex: Treating RN: 08/18/1961 (62 y.o. [name] Primary Care Ida Uppal: [facility] Other Clinician: Referring Kmya Placide: Treating Courtney Fenlon/Extender: [facility], [place] Cerise in Treatment: 8 Encounter Discharge Information Items Discharge Condition: Stable Ambulatory Status: Wheelchair Discharge Destination: Home Transportation: Private Auto Accompanied By: spouse Schedule Follow-up Appointment: Yes Clinical Summary of Care: Patient Declined Electronic Signature(s) Signed: 07/15/2023 12:34:57 PM By: Karie Schwalbe RN Entered By: Karie Schwalbe on 07/15/2023 09:34:23 -------------------------------------------------------------------------------- Lower Extremity Assessment Details Patient Name: Date of Service: [name], [name]. 07/15/2023 10:30 Mark M Medical Record Number: [id number] Patient Account Number: [id number] Date of Birth/Sex: Treating RN: 1961/08/29 (62 y.o. [name] Primary Care Aahana Elza: [facility] Other Clinician: Referring Donovin Kraemer: Treating Gotti Alwin/Extender: [facility], [place] Cerise in Treatment: 8 Electronic Signature(s) Signed: 07/15/2023 12:34:57 PM By: Karie Schwalbe RN Entered By: Karie Schwalbe on 07/15/2023 08:32:50 -------------------------------------------------------------------------------- Multi Wound Chart Details Patient Name: Date of Service: [name], [name]. 07/15/2023 10:30 Mark M Medical Record Number: [id number] Patient Account Number: [id number] [name], [name] ([id number]) [id number] Page 4 of 14 Date of Birth/Sex: Treating RN: July 02, 1961 (62 y.o. M) Primary Care Nochum Fenter: Other Clinician: [facility] Referring Jawad Wiacek: Treating Anikah Hogge/Extender: Kathaleen Grinder in Treatment: 8 Vital Signs Height(in): 70 Pulse(bpm): 52 Weight(lbs): 176 Blood  Pressure(mmHg): 108/67 Body Mass Index(BMI): 25.3 Temperature([name]): 97.7 Respiratory Rate(breaths/min): 16 [2:Photos:] Medial Back Left Gluteus Right Gluteal fold Wound Location: Gradually Appeared Gradually Appeared Pressure Injury Wounding Event: Pressure Ulcer Pressure Ulcer Pressure Ulcer Primary Etiology: Asthma Asthma Asthma Comorbid History: 05/14/2022 05/14/2022 05/14/2022 Date Acquired: 8 8 8  Weeks of Treatment: Open Open Open Wound Status: No No No Wound Recurrence: 1.7x0.5x0.1 6x2.4x1.5 3x8.2x1 Measurements L x W x D (cm) 0.668 11.31 19.321 Mark (cm) : rea 0.067 16.965 19.321 Volume (cm) : 69.10% 12.70% 16.70% %

## 2023-07-20 ENCOUNTER — Encounter: Payer: Self-pay | Admitting: Neurology

## 2023-07-21 ENCOUNTER — Other Ambulatory Visit: Payer: Self-pay | Admitting: *Deleted

## 2023-07-21 ENCOUNTER — Other Ambulatory Visit: Payer: Self-pay

## 2023-07-21 MED ORDER — PREGABALIN 150 MG PO CAPS: 150.0000 mg | ORAL_CAPSULE | Freq: Three times a day (TID) | ORAL | 5 refills | Status: DC

## 2023-07-21 NOTE — Telephone Encounter (Signed)
Faxed printed/signed rx to pharmacy. Received fax confirmation.

## 2023-07-29 ENCOUNTER — Encounter (HOSPITAL_BASED_OUTPATIENT_CLINIC_OR_DEPARTMENT_OTHER): Payer: No Typology Code available for payment source | Admitting: Internal Medicine

## 2023-07-29 DIAGNOSIS — L89314 Pressure ulcer of right buttock, stage 4: Secondary | ICD-10-CM | POA: Diagnosis not present

## 2023-08-03 NOTE — Progress Notes (Signed)
[name] ([id number]) [id number] Page 1 of 7 Visit Report for 07/29/2023 Debridement Details Patient Name: Date of Service: [name]. 07/29/2023 10:30 [name] Medical Record Number: [id number] Patient Account Number: [id number] Date of Birth/Sex: Treating RN: 03-06-1961 (62 y.o. [name]) Primary Care Provider: [facility] Other Clinician: Referring Provider: Treating Provider/Extender: Baltazar Najjar [facility], [place] in Treatment: 10 Debridement Performed for Assessment: Wound #2 Medial Back Performed By: Physician Maxwell Caul., MD The following information was scribed by: Karie Schwalbe The information was scribed for: Baltazar Najjar Debridement Type: Debridement Level of Consciousness (Pre-procedure): Awake and Alert Pre-procedure Verification/Time Out Yes - 11:32 Taken: Start Time: 11:32 Pain Control: Lidocaine 4% T opical Solution Percent of Wound Bed Debrided: 100% T Area Debrided (cm): otal 1.65 Tissue and other material debrided: Non-Viable, Slough, Slough Level: Non-Viable Tissue Debridement Description: Selective/Open Wound Instrument: Curette Bleeding: Minimum Hemostasis Achieved: Pressure Response to Treatment: Procedure was tolerated well Level of Consciousness (Post- Awake and Alert procedure): Post Debridement Measurements of Total Wound Length: (cm) 2.1 Stage: Category/Stage III Width: (cm) 1 Depth: (cm) 0.1 Volume: (cm) 0.165 Character of Wound/Ulcer Post Debridement: Improved Post Procedure Diagnosis Same as Pre-procedure Electronic Signature(s) Signed: 08/02/2023 6:11:24 PM By: Baltazar Najjar MD Entered By: Baltazar Najjar on 07/29/2023 12:01:54 -------------------------------------------------------------------------------- HPI Details Patient Name: Date of Service: [name], [name]. 07/29/2023 10:30 [name] Medical Record Number: [id number] Patient Account Number: [id number] Date of  Birth/Sex: Treating RN: 1961-05-25 (62 y.o. [name]) Primary Care Provider: [facility] Other Clinician: Referring Provider: Treating Provider/Extender: Kathaleen Grinder in Treatment: 10 History of Present Illness [name], [name] ([id number]) [id number] Page 2 of 7 HPI Description: 05/18/2023 [name] is Mark 62 year old male with Mark past medical history of multiple sclerosis and neurogenic bladder with chronic indwelling Foley catheter that presents to the clinic for Mark 1 year history of nonhealing ulcers to the bilateral ischium As well as upper back. He has an air mattress. He spends about 50% of the time in the bed and 50% of the time in the wheelchair. He does not have Mark Roho cushion. He has been using Medihoney and Santyl to the wound beds. He currently denies systemic signs of infection. Of note he was hospitalized on 2 occasions in January 2024 for trigeminal neuralgia. He has lost Mark significant amount of weight due to this and his oral intake has declined. He was evaluated for PEG tube but was not Mark candidate. 8/19; patient presents for follow-up. Patient has home health. He has been using Dakin's wet-to-dry dressings to the ischial wounds and Santyl to the back wound. Patient was recently hospitalized for UTI and received IV Zosyn for 3 days followed by fosfomycin. He completed 6 days of antibiotics. He currently denies systemic signs of infection. He also had Mark portable pelvic x-ray that did not show any obvious erosive changes. Wife is presentAnd she states she is trying to aggressively offload the wound beds by repositioning him on his sides. Unfortunately he has developed two wounds 1 to the right buttocks and 1 to the midline back. 9/5; patient presents for follow-up. Wife is present. She has been using Dakin's wet-to-dry dressings to the ischial wounds and Santyl to the back wounds and right buttocks wound. Overall wounds  have improved in appearance and size since last clinic visit. 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  is convinced the wounds are smaller. The area on the right still has Mark small area of exposed bone. 2. Still with the Santyl Hydrofera Blue to the back wound. 3. The alternatives to the ischial tuberosity wounds would be limited by the area of wound dressing it would take. I think we could probably do Mark wound VAC on the left but the location on the right would make that virtually impossible. I do not believe we could bridge this Electronic Signature(s) Signed: 08/02/2023 6:11:24 PM By: Baltazar Najjar MD [name], [name] ([id number]) [phone].pdf Page 7 of 7 Entered By: Baltazar Najjar on 07/29/2023 12:23:28 -------------------------------------------------------------------------------- SuperBill Details Patient Name: Date of Service: [name], [name]. 07/29/2023 Medical Record Number: [id number] Patient Account Number: [id number] Date of Birth/Sex: Treating RN: [birth year] (62 y.o. [name]) Primary Care Provider: [facility] Other Clinician: Referring Provider: Treating Provider/Extender: [facility] [place] in Treatment: 10 Diagnosis Coding ICD-10 Codes Code Description L89.314 Pressure ulcer of right buttock, stage 4 L89.324 Pressure ulcer of left buttock, stage 4 L89.312 Pressure ulcer of right buttock, stage 2 L89.103 Pressure ulcer of unspecified part of back, stage 3 L89.102 Pressure ulcer of unspecified part of back, stage 2 E44.0 Moderate protein-calorie malnutrition G35 Multiple sclerosis N31.9 Neuromuscular dysfunction of bladder, unspecified Facility Procedures : CPT4 Code:  [id number] Description: 60630 - DEBRIDE WOUND 1ST 20 SQ CM OR < ICD-10 Diagnosis Description L89.102 Pressure ulcer of unspecified part of back, stage 2 Modifier: Quantity: 1 Physician Procedures : CPT4 Code Description Modifier [id number] - WC PHYS DEBR WO ANESTH 20 SQ CM ICD-10 Diagnosis Description L89.102 Pressure ulcer of unspecified part of back, stage 2 Quantity: 1 Electronic Signature(s) Signed: 08/02/2023 6:11:24 PM By: Baltazar Najjar MD Entered By: Baltazar Najjar on 07/29/2023 12:23:47  6. Santyl and hydrofera blue bordered foam to both back wounds. Other Home Health Orders/Instructions: - Please see the patient at least once Mark week OR please see the patient's spouse regarding wound supplies ( to order supplies for the patient) [facility] Wound Treatment Wound #2 - Back Wound Laterality: Medial Cleanser: Vashe 5.8 (oz) (Home Health) 1 x Per Day/30 Days Discharge Instructions: Cleanse the wound with Vashe prior to applying Mark clean dressing using gauze sponges, not tissue or cotton balls. Peri-Wound Care: Skin Prep (Home Health) 1 x Per Day/30 Days Discharge Instructions: Use skin prep as directed Topical: Santyl Collagenase Ointment, 30 (gm), tube 1 x Per Day/30 Days Prim Dressing: Hydrofera Blue Ready Transfer Foam, 4x5 (in/in) (Generic) 1 x Per Day/30 Days ary Discharge Instructions: Apply to wound bed as instructed Secondary Dressing: Zetuvit Plus Silicone Border Dressing 4x4 (in/in) (Home Health) 1 x Per Day/30 Days Discharge Instructions: Apply silicone border over primary dressing as directed. Wound #3 - Gluteus Wound Laterality: Left Cleanser: Vashe 5.8 (oz) (Home Health) (Generic) 1 x Per Day/30 Days Discharge Instructions: Cleanse the wound with Vashe prior to applying Mark clean dressing using gauze sponges, not tissue or cotton balls. Prim Dressing: Dakin's Solution 0.25%, 16 (oz) (Home Health) 1 x Per Day/30 Days ary Discharge Instructions: Moisten gauze with Dakin's solution Secondary Dressing: Zetuvit Plus Silicone Border Dressing 7x7(in/in) (Home Health) 1 x Per Day/30 Days Discharge Instructions: Apply silicone border over primary dressing as directed. Wound #4 - Gluteal  fold Wound Laterality: Right Cleanser: Vashe 5.8 (oz) (Home Health) (Generic) 1 x Per Day/30 Days Discharge Instructions: Cleanse the wound with Vashe prior to applying Mark clean dressing using gauze sponges, not tissue or cotton balls. Prim Dressing: Dakin's Solution 0.25%, 16 (oz) (Home Health) 1 x Per Day/30 Days ary Discharge Instructions: Moisten gauze with Dakin's solution Secondary Dressing: Zetuvit Plus Silicone Border Dressing 7x7(in/in) (Home Health) 1 x Per Day/30 Days Discharge Instructions: Apply silicone border over primary dressing as directed. Electronic Signature(s) Signed: 07/30/2023 7:32:31 AM By: Karie Schwalbe RN Signed: 08/02/2023 6:11:24 PM By: Baltazar Najjar MD Entered By: Karie Schwalbe on 07/29/2023 11:38:34 [name], [name] ([id number]) [id number] Page 4 of 7 -------------------------------------------------------------------------------- Problem List Details Patient Name: Date of Service: [name]. 07/29/2023 10:30 [name] Medical Record Number: [id number] Patient Account Number: [id number] Date of Birth/Sex: Treating RN: [birth year] (62 y.o. 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, stage 4 05/18/2023 No Yes L89.312 Pressure ulcer of right buttock, stage 2 05/31/2023 No Yes L89.103 Pressure ulcer of unspecified part of back, stage 3 05/18/2023 No Yes L89.102 Pressure ulcer of unspecified part of back, stage 2 05/31/2023 No Yes E44.0 Moderate protein-calorie malnutrition 05/18/2023 No Yes G35 Multiple sclerosis 05/18/2023 No Yes 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 07/29/2023 10:30 [name] Medical Record Number: [id number] Patient Account Number: [id number] Date of Birth/Sex: Treating RN: 18-Jan-1961 (62 y.o. Louellen Molder, [name] ([id number]) [id number] Page 5 of 7 Primary Care Provider: [facility], [place] Other Clinician: Referring Provider: Treating  He is here with his wife. 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 His wife tells me she gets him up in the wheelchair at about 7:00 in the morning and he is there till 5:00 at night when they put him to bed. He is apparently eating and drinking well. We have been using Dakin's wet to dry did the stage IV wounds on his ischial tuberosities and Santyl with Hydrofera Blue to Mark small area on his back at roughly T4 10/3 patient with advanced multiple sclerosis. He has stage IV wounds over both ischial tuberosities and more superficial area on his back. He has been using Dakin's wet-to-dry on the ischial tuberosity wounds Santyl with Hydrofera Blue to the wound on his back 10/7; patient with advanced multiple sclerosis he has stage IV wounds over [name] at the ischial tuberosities more superficial areas on his spinal process. He still has an open area on the back. He has been using Dakin's wet-to-dry on the ischial tuberosity wounds Santyl with Hydrofera Blue to the wound on his back that still remains Electronic Signature(s) Signed: 08/02/2023 6:11:24 PM By: Baltazar Najjar MD Entered By: Baltazar Najjar on 07/29/2023 12:03:00 -------------------------------------------------------------------------------- Physical Exam Details Patient Name: Date of Service: [name], [name]. 07/29/2023 10:30 [name] Medical Record Number: [id number] Patient Account Number: [id number] Date of Birth/Sex: Treating RN: [birth year] (62 y.o. [name]) Primary Care Provider: [facility] Other Clinician: Referring Provider: Treating Provider/Extender: Kathaleen Grinder in Treatment: 10 Constitutional Patient is  hypotensive.However the patient appears to be stable. Pulse regular and within target range for patient.Marland Kitchen Respirations regular, non-labored and within target range.. Temperature is normal and within the target range for the patient.Marland Kitchen Appears in no distress. Notes Wound exam; back wound still with some surface debris I scraped that off with Mark #3 curette. There are healed wounds below this surrounded epithelialization around the remaining wound Both ischial tuberosities exposed but covered with granulation tissue there is undermining around them. The wounds go precariously close to the scrotum. I am doubtful that we could manage the wound VAC here. Electronic Signature(s) Signed: 08/02/2023 6:11:24 PM By: Baltazar Najjar MD Entered By: Baltazar Najjar on 07/29/2023 12:21:59 -------------------------------------------------------------------------------- Physician Orders Details Patient Name: Date of Service: [name], [name]. 07/29/2023 10:30 [name] Medical Record Number: [id number] Patient Account Number: [id number] Date of Birth/Sex: Treating RN: 11/18/1960 (62 y.o. [name] Primary Care Provider: [facility] Other Clinician: Referring Provider: Treating Provider/Extender: Webb Laws, [name] ([id number]) [phone].pdf Page 3 of 7 Weeks in Treatment: 10 Verbal / Phone Orders: No Diagnosis Coding Follow-up Appointments ppointment in 2 weeks. - ****HOYER ***extra time 60 minutes Dr.Kysean Sweet Thursday 08/12/23 @ 10:30am Return Mark room 9 Return appointment in 1 month. - Pleas ask front desk for appointment Anesthetic (In clinic) Topical Lidocaine 4% applied to wound bed Bathing/ Shower/ Hygiene Other Bathing/Shower/Hygiene Orders/Instructions: - May bathe with soap and water Off-Loading Turn and reposition every 2 hours Other: - only an hour up to eat and back to bed. Turn every 2 hours. Best to stay in bed turn  side to side and back one postition for max of 2 hrs Home Health New wound care orders this week; continue Home Health for wound care. May utilize formulary equivalent dressing for wound treatment orders unless otherwise specified. - Dakin's solution wet to dry to wounds #4 and #  6. Santyl and hydrofera blue bordered foam to both back wounds. Other Home Health Orders/Instructions: - Please see the patient at least once Mark week OR please see the patient's spouse regarding wound supplies ( to order supplies for the patient) [facility] Wound Treatment Wound #2 - Back Wound Laterality: Medial Cleanser: Vashe 5.8 (oz) (Home Health) 1 x Per Day/30 Days Discharge Instructions: Cleanse the wound with Vashe prior to applying Mark clean dressing using gauze sponges, not tissue or cotton balls. Peri-Wound Care: Skin Prep (Home Health) 1 x Per Day/30 Days Discharge Instructions: Use skin prep as directed Topical: Santyl Collagenase Ointment, 30 (gm), tube 1 x Per Day/30 Days Prim Dressing: Hydrofera Blue Ready Transfer Foam, 4x5 (in/in) (Generic) 1 x Per Day/30 Days ary Discharge Instructions: Apply to wound bed as instructed Secondary Dressing: Zetuvit Plus Silicone Border Dressing 4x4 (in/in) (Home Health) 1 x Per Day/30 Days Discharge Instructions: Apply silicone border over primary dressing as directed. Wound #3 - Gluteus Wound Laterality: Left Cleanser: Vashe 5.8 (oz) (Home Health) (Generic) 1 x Per Day/30 Days Discharge Instructions: Cleanse the wound with Vashe prior to applying Mark clean dressing using gauze sponges, not tissue or cotton balls. Prim Dressing: Dakin's Solution 0.25%, 16 (oz) (Home Health) 1 x Per Day/30 Days ary Discharge Instructions: Moisten gauze with Dakin's solution Secondary Dressing: Zetuvit Plus Silicone Border Dressing 7x7(in/in) (Home Health) 1 x Per Day/30 Days Discharge Instructions: Apply silicone border over primary dressing as directed. Wound #4 - Gluteal  fold Wound Laterality: Right Cleanser: Vashe 5.8 (oz) (Home Health) (Generic) 1 x Per Day/30 Days Discharge Instructions: Cleanse the wound with Vashe prior to applying Mark clean dressing using gauze sponges, not tissue or cotton balls. Prim Dressing: Dakin's Solution 0.25%, 16 (oz) (Home Health) 1 x Per Day/30 Days ary Discharge Instructions: Moisten gauze with Dakin's solution Secondary Dressing: Zetuvit Plus Silicone Border Dressing 7x7(in/in) (Home Health) 1 x Per Day/30 Days Discharge Instructions: Apply silicone border over primary dressing as directed. Electronic Signature(s) Signed: 07/30/2023 7:32:31 AM By: Karie Schwalbe RN Signed: 08/02/2023 6:11:24 PM By: Baltazar Najjar MD Entered By: Karie Schwalbe on 07/29/2023 11:38:34 [name], [name] ([id number]) [id number] Page 4 of 7 -------------------------------------------------------------------------------- Problem List Details Patient Name: Date of Service: [name]. 07/29/2023 10:30 [name] Medical Record Number: [id number] Patient Account Number: [id number] Date of Birth/Sex: Treating RN: [birth year] (62 y.o. 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, stage 4 05/18/2023 No Yes L89.312 Pressure ulcer of right buttock, stage 2 05/31/2023 No Yes L89.103 Pressure ulcer of unspecified part of back, stage 3 05/18/2023 No Yes L89.102 Pressure ulcer of unspecified part of back, stage 2 05/31/2023 No Yes E44.0 Moderate protein-calorie malnutrition 05/18/2023 No Yes G35 Multiple sclerosis 05/18/2023 No Yes 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 07/29/2023 10:30 [name] Medical Record Number: [id number] Patient Account Number: [id number] Date of Birth/Sex: Treating RN: 18-Jan-1961 (62 y.o. Louellen Molder, [name] ([id number]) [id number] Page 5 of 7 Primary Care Provider: [facility], [place] Other Clinician: Referring Provider: Treating  6. Santyl and hydrofera blue bordered foam to both back wounds. Other Home Health Orders/Instructions: - Please see the patient at least once Mark week OR please see the patient's spouse regarding wound supplies ( to order supplies for the patient) [facility] Wound Treatment Wound #2 - Back Wound Laterality: Medial Cleanser: Vashe 5.8 (oz) (Home Health) 1 x Per Day/30 Days Discharge Instructions: Cleanse the wound with Vashe prior to applying Mark clean dressing using gauze sponges, not tissue or cotton balls. Peri-Wound Care: Skin Prep (Home Health) 1 x Per Day/30 Days Discharge Instructions: Use skin prep as directed Topical: Santyl Collagenase Ointment, 30 (gm), tube 1 x Per Day/30 Days Prim Dressing: Hydrofera Blue Ready Transfer Foam, 4x5 (in/in) (Generic) 1 x Per Day/30 Days ary Discharge Instructions: Apply to wound bed as instructed Secondary Dressing: Zetuvit Plus Silicone Border Dressing 4x4 (in/in) (Home Health) 1 x Per Day/30 Days Discharge Instructions: Apply silicone border over primary dressing as directed. Wound #3 - Gluteus Wound Laterality: Left Cleanser: Vashe 5.8 (oz) (Home Health) (Generic) 1 x Per Day/30 Days Discharge Instructions: Cleanse the wound with Vashe prior to applying Mark clean dressing using gauze sponges, not tissue or cotton balls. Prim Dressing: Dakin's Solution 0.25%, 16 (oz) (Home Health) 1 x Per Day/30 Days ary Discharge Instructions: Moisten gauze with Dakin's solution Secondary Dressing: Zetuvit Plus Silicone Border Dressing 7x7(in/in) (Home Health) 1 x Per Day/30 Days Discharge Instructions: Apply silicone border over primary dressing as directed. Wound #4 - Gluteal  fold Wound Laterality: Right Cleanser: Vashe 5.8 (oz) (Home Health) (Generic) 1 x Per Day/30 Days Discharge Instructions: Cleanse the wound with Vashe prior to applying Mark clean dressing using gauze sponges, not tissue or cotton balls. Prim Dressing: Dakin's Solution 0.25%, 16 (oz) (Home Health) 1 x Per Day/30 Days ary Discharge Instructions: Moisten gauze with Dakin's solution Secondary Dressing: Zetuvit Plus Silicone Border Dressing 7x7(in/in) (Home Health) 1 x Per Day/30 Days Discharge Instructions: Apply silicone border over primary dressing as directed. Electronic Signature(s) Signed: 07/30/2023 7:32:31 AM By: Karie Schwalbe RN Signed: 08/02/2023 6:11:24 PM By: Baltazar Najjar MD Entered By: Karie Schwalbe on 07/29/2023 11:38:34 [name], [name] ([id number]) [id number] Page 4 of 7 -------------------------------------------------------------------------------- Problem List Details Patient Name: Date of Service: [name]. 07/29/2023 10:30 [name] Medical Record Number: [id number] Patient Account Number: [id number] Date of Birth/Sex: Treating RN: [birth year] (62 y.o. 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, stage 4 05/18/2023 No Yes L89.312 Pressure ulcer of right buttock, stage 2 05/31/2023 No Yes L89.103 Pressure ulcer of unspecified part of back, stage 3 05/18/2023 No Yes L89.102 Pressure ulcer of unspecified part of back, stage 2 05/31/2023 No Yes E44.0 Moderate protein-calorie malnutrition 05/18/2023 No Yes G35 Multiple sclerosis 05/18/2023 No Yes 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 07/29/2023 10:30 [name] Medical Record Number: [id number] Patient Account Number: [id number] Date of Birth/Sex: Treating RN: 18-Jan-1961 (62 y.o. Louellen Molder, [name] ([id number]) [id number] Page 5 of 7 Primary Care Provider: [facility] [place] Other Clinician: Referring Provider: Treating  6. Santyl and hydrofera blue bordered foam to both back wounds. Other Home Health Orders/Instructions: - Please see the patient at least once Mark week OR please see the patient's spouse regarding wound supplies ( to order supplies for the patient) [facility] Wound Treatment Wound #2 - Back Wound Laterality: Medial Cleanser: Vashe 5.8 (oz) (Home Health) 1 x Per Day/30 Days Discharge Instructions: Cleanse the wound with Vashe prior to applying Mark clean dressing using gauze sponges, not tissue or cotton balls. Peri-Wound Care: Skin Prep (Home Health) 1 x Per Day/30 Days Discharge Instructions: Use skin prep as directed Topical: Santyl Collagenase Ointment, 30 (gm), tube 1 x Per Day/30 Days Prim Dressing: Hydrofera Blue Ready Transfer Foam, 4x5 (in/in) (Generic) 1 x Per Day/30 Days ary Discharge Instructions: Apply to wound bed as instructed Secondary Dressing: Zetuvit Plus Silicone Border Dressing 4x4 (in/in) (Home Health) 1 x Per Day/30 Days Discharge Instructions: Apply silicone border over primary dressing as directed. Wound #3 - Gluteus Wound Laterality: Left Cleanser: Vashe 5.8 (oz) (Home Health) (Generic) 1 x Per Day/30 Days Discharge Instructions: Cleanse the wound with Vashe prior to applying Mark clean dressing using gauze sponges, not tissue or cotton balls. Prim Dressing: Dakin's Solution 0.25%, 16 (oz) (Home Health) 1 x Per Day/30 Days ary Discharge Instructions: Moisten gauze with Dakin's solution Secondary Dressing: Zetuvit Plus Silicone Border Dressing 7x7(in/in) (Home Health) 1 x Per Day/30 Days Discharge Instructions: Apply silicone border over primary dressing as directed. Wound #4 - Gluteal  fold Wound Laterality: Right Cleanser: Vashe 5.8 (oz) (Home Health) (Generic) 1 x Per Day/30 Days Discharge Instructions: Cleanse the wound with Vashe prior to applying Mark clean dressing using gauze sponges, not tissue or cotton balls. Prim Dressing: Dakin's Solution 0.25%, 16 (oz) (Home Health) 1 x Per Day/30 Days ary Discharge Instructions: Moisten gauze with Dakin's solution Secondary Dressing: Zetuvit Plus Silicone Border Dressing 7x7(in/in) (Home Health) 1 x Per Day/30 Days Discharge Instructions: Apply silicone border over primary dressing as directed. Electronic Signature(s) Signed: 07/30/2023 7:32:31 AM By: Karie Schwalbe RN Signed: 08/02/2023 6:11:24 PM By: Baltazar Najjar MD Entered By: Karie Schwalbe on 07/29/2023 11:38:34 [name], [name] ([id number]) [id number] Page 4 of 7 -------------------------------------------------------------------------------- Problem List Details Patient Name: Date of Service: [name]. 07/29/2023 10:30 [name] Medical Record Number: [id number] Patient Account Number: [id number] Date of Birth/Sex: Treating RN: [birth year] (62 y.o. 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, stage 4 05/18/2023 No Yes L89.312 Pressure ulcer of right buttock, stage 2 05/31/2023 No Yes L89.103 Pressure ulcer of unspecified part of back, stage 3 05/18/2023 No Yes L89.102 Pressure ulcer of unspecified part of back, stage 2 05/31/2023 No Yes E44.0 Moderate protein-calorie malnutrition 05/18/2023 No Yes G35 Multiple sclerosis 05/18/2023 No Yes 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 07/29/2023 10:30 [name] Medical Record Number: [id number] Patient Account Number: [id number] Date of Birth/Sex: Treating RN: 18-Jan-1961 (62 y.o. Louellen Molder, [name] ([id number]) [id number] Page 5 of 7 Primary Care Provider: [facility], [place] Other Clinician: Referring Provider: Treating

## 2023-08-03 NOTE — Progress Notes (Signed)
[birth year] (62 y.o. [name] Primary Care Gamaliel Charney: [facility] Other Clinician: Referring Shaida Route: Treating Ephram Kornegay/Extender: [facility] [place] in Treatment: 10 Wound Status Wound Number: 3 Primary Etiology: Pressure Ulcer Wound Location: Left Gluteus Wound Status: Open Wounding Event: Gradually Appeared Comorbid History: Asthma Date Acquired: 05/14/2022 Weeks Of Treatment: 10 Clustered Wound: No Photos Wound Measurements Length: (cm) 4.4 Width: (cm) 3.1 Depth: (cm) 1.8 Area: (cm) 10.713 Volume: (cm) 19.283 % Reduction in Area: 17.3% % Reduction in Volume: 57.5% Tunneling: Yes Position (o'clock): 1 Maximum Distance: (cm) 3.8 Undermining: Yes Starting Position (o'clock): 2 Ending Position (o'clock): 4 Maximum Distance: (cm) 3.5 Wound Description Classification: Category/Stage IV Wound Margin: Distinct, outline attached Exudate Amount: Medium Exudate Type: Serosanguineous Exudate Color: red, brown Foul Odor After Cleansing: No Slough/Fibrino No Wound Bed Granulation Amount: Medium (34-66%) Exposed Structure Granulation Quality: Red, Pink Fat Layer (Subcutaneous Tissue) Exposed: Yes Necrotic Amount: Medium (34-66%) Muscle Exposed: Yes Necrotic Quality: Adherent Slough Necrosis of Muscle: No Bone Exposed: Yes [name], Haley F ([id number]) [id number].pdf Page 8 of 10 Periwound Skin Texture Texture Color No Abnormalities Noted: No No Abnormalities Noted: No Scarring: Yes Hemosiderin Staining: Yes Moisture Temperature / Pain No Abnormalities Noted: No Temperature: No Abnormality Dry / Scaly: No Maceration:  No Treatment Notes Wound #3 (Gluteus) Wound Laterality: Left Cleanser Vashe 5.8 (oz) Discharge Instruction: Cleanse the wound with Vashe prior to applying a clean dressing using gauze sponges, not tissue or cotton balls. Peri-Wound Care Topical Primary Dressing Dakin's Solution 0.25%, 16 (oz) Discharge Instruction: Moisten gauze with Dakin's solution Secondary Dressing Zetuvit Plus Silicone Border Dressing 7x7(in/in) Discharge Instruction: Apply silicone border over primary dressing as directed. Secured With Compression Wrap Compression Stockings Facilities manager) Signed: 07/30/2023 7:32:31 AM By: Karie Schwalbe RN Entered By: Karie Schwalbe on 07/29/2023 11:27:53 -------------------------------------------------------------------------------- Wound Assessment Details Patient Name: Date of Service: [name], [name] 07/29/2023 10:30 A M Medical Record Number: [id number] Patient Account Number: [id number] Date of Birth/Sex: Treating RN: Aug 03, 1961 (62 y.o. [name] Primary Care Jordayn Mink: [facility] Other Clinician: Referring Awilda Covin: Treating Blessings Inglett/Extender: [facility] [place] in Treatment: 10 Wound Status Wound Number: 4 Primary Etiology: Pressure Ulcer Wound Location: Right Gluteal fold Wound Status: Open Wounding Event: Pressure Injury Comorbid History: Asthma Date Acquired: 05/14/2022 Weeks Of Treatment: 10 Clustered Wound: No Photos [name], Zaid F ([id number]) [phone].pdf Page 9 of 10 Wound Measurements Length: (cm) 2.7 Width: (cm) 6.2 Depth: (cm) 1 Area: (cm) 13.148 Volume: (cm) 13.148 % Reduction in Area: 43.3% % Reduction in Volume: 83.8% Tunneling: No Undermining: Yes Starting Position (o'clock): 9 Ending Position (o'clock): 3 Maximum Distance: (cm) 2.5 Wound Description Classification: Category/Stage IV Wound Margin: Distinct, outline attached Exudate  Amount: Medium Exudate Type: Serous Exudate Color: amber Foul Odor After Cleansing: No Slough/Fibrino No Wound Bed Granulation Amount: Medium (34-66%) Exposed Structure Granulation Quality: Red, Pink Fascia Exposed: No Necrotic Amount: Small (1-33%) Fat Layer (Subcutaneous Tissue) Exposed: Yes Necrotic Quality: Adherent Slough Tendon Exposed: No Muscle Exposed: Yes Necrosis of Muscle: No Joint Exposed: No Bone Exposed: No Periwound Skin Texture Texture Color No Abnormalities Noted: Yes No Abnormalities Noted: Yes Moisture No Abnormalities Noted: Yes Treatment Notes Wound #4 (Gluteal fold) Wound Laterality: Right Cleanser Vashe 5.8 (oz) Discharge Instruction: Cleanse the wound with Vashe prior to applying a clean dressing using gauze sponges, not tissue or cotton balls. Peri-Wound Care Topical Primary Dressing Dakin's Solution 0.25%, 16 (oz) Discharge Instruction: Moisten gauze with Dakin's solution Secondary Dressing Zetuvit Plus Silicone  [name] ([id number]) [id number] Page 1 of 10 Visit Report for 07/29/2023 Arrival Information Details Patient Name: Date of Service: [name]. 07/29/2023 10:30 A M Medical Record Number: [id number] Patient Account Number: [id number] Date of Birth/Sex: Treating RN: 1961/01/15 (62 y.o. [name] Primary Care Alantra Popoca: [facility] Other Clinician: Referring Mate Alegria: Treating Melesio Madara/Extender: [facility] [place] in Treatment: 10 Visit Information History Since Last Visit Added or deleted any medications: No Patient Arrived: Wheel Chair Any new allergies or adverse reactions: No Arrival Time: 10:38 Had a fall or experienced change in No Accompanied By: wife activities of daily living that may affect Transfer Assistance: Michiel Sites Lift risk of falls: Patient Identification Verified: Yes Signs or symptoms of abuse/neglect since last visito No Patient Requires Transmission-Based Precautions: No Hospitalized since last visit: No Patient Has Alerts: No Implantable device outside of the clinic excluding No cellular tissue based products placed in the center since last visit: Has Dressing in Place as Prescribed: Yes Pain Present Now: Yes Electronic Signature(s) Signed: 07/30/2023 7:32:31 AM By: Karie Schwalbe RN Entered By: Karie Schwalbe on 07/29/2023 10:39:16 -------------------------------------------------------------------------------- Encounter Discharge Information Details Patient Name: Date of Service: [name], [name] 07/29/2023 10:30 A M Medical Record Number: [id number] Patient Account Number: [id number] Date of Birth/Sex: Treating RN: 1960/11/23 (62 y.o. [name] Primary Care Birney Belshe: [facility] Other Clinician: Referring Truth Wolaver: Treating Lachanda Buczek/Extender: Kathaleen Grinder in Treatment: 10 Encounter Discharge Information Items Post  Procedure Vitals Discharge Condition: Stable Temperature (F): 97.8 Ambulatory Status: Wheelchair Pulse (bpm): 64 Discharge Destination: Home Respiratory Rate (breaths/min): 18 Transportation: Private Auto Blood Pressure (mmHg): 92/59 Accompanied By: spouse Schedule Follow-up Appointment: No Clinical Summary of Care: Patient Declined Electronic Signature(s) Signed: 07/30/2023 7:32:31 AM By: Karie Schwalbe RN Entered By: Karie Schwalbe on 07/30/2023 07:31:58 Beadnell, Pauline F ([id number]) [id number] Page 2 of 10 -------------------------------------------------------------------------------- Lower Extremity Assessment Details Patient Name: Date of Service: [name] 07/29/2023 10:30 A M Medical Record Number: [id number] Patient Account Number: [id number] Date of Birth/Sex: Treating RN: 01-Aug-1961 (62 y.o. [name] Primary Care Shir Bergman: [facility] Other Clinician: Referring Dvid Pendry: Treating Ren Grasse/Extender: [facility] [place] in Treatment: 10 Electronic Signature(s) Signed: 07/30/2023 7:32:31 AM By: Karie Schwalbe RN Entered By: Karie Schwalbe on 07/29/2023 10:41:02 -------------------------------------------------------------------------------- Multi Wound Chart Details Patient Name: Date of Service: [name], [name] 07/29/2023 10:30 A M Medical Record Number: [id number] Patient Account Number: [id number] Date of Birth/Sex: Treating RN: [birth year] (62 y.o. M) Primary Care Kimani Bedoya: [facility] Other Clinician: Referring Jashira Cotugno: Treating Jaslynn Thome/Extender: [facility] [place] in Treatment: 10 Vital Signs Height(in): 70 Pulse(bpm): 64 Weight(lbs): 176 Blood Pressure(mmHg): 92/59 Body Mass Index(BMI): 25.3 Temperature(F): 97.8 Respiratory Rate(breaths/min): 18 [2:Photos:] Medial Back Left Gluteus Right Gluteal fold Wound Location: Gradually  Appeared Gradually Appeared Pressure Injury Wounding Event: Pressure Ulcer Pressure Ulcer Pressure Ulcer Primary Etiology: Asthma Asthma Asthma Comorbid History: 05/14/2022 05/14/2022 05/14/2022 Date Acquired: 10 10 10  Weeks of Treatment: Open Open Open Wound Status: No No No Wound Recurrence: 2.1x1x0.1 4.4x3.1x1.8 2.7x6.2x1 Measurements L x W x D (cm) 1.649 10.713 13.148 A (cm) : rea 0.165 19.283 13.148 Volume (cm) : 23.70% 17.30% 43.30% % Reduction in A rea: 61.80% 57.50% 83.80% % Reduction in Volume: 1 Position 1 (o'clock): 3.8 Maximum Distance 1 (cm): 2 9 Starting Position 1 (o'clock): 4 3 Ending Position 1 (o'clock): 3.5 2.5 Maximum Distance 1 (cm): No Yes  [name] ([id number]) [id number] Page 1 of 10 Visit Report for 07/29/2023 Arrival Information Details Patient Name: Date of Service: [name]. 07/29/2023 10:30 A M Medical Record Number: [id number] Patient Account Number: [id number] Date of Birth/Sex: Treating RN: 1961/01/15 (62 y.o. [name] Primary Care Alantra Popoca: [facility] Other Clinician: Referring Mate Alegria: Treating Melesio Madara/Extender: [facility] [place] in Treatment: 10 Visit Information History Since Last Visit Added or deleted any medications: No Patient Arrived: Wheel Chair Any new allergies or adverse reactions: No Arrival Time: 10:38 Had a fall or experienced change in No Accompanied By: wife activities of daily living that may affect Transfer Assistance: Michiel Sites Lift risk of falls: Patient Identification Verified: Yes Signs or symptoms of abuse/neglect since last visito No Patient Requires Transmission-Based Precautions: No Hospitalized since last visit: No Patient Has Alerts: No Implantable device outside of the clinic excluding No cellular tissue based products placed in the center since last visit: Has Dressing in Place as Prescribed: Yes Pain Present Now: Yes Electronic Signature(s) Signed: 07/30/2023 7:32:31 AM By: Karie Schwalbe RN Entered By: Karie Schwalbe on 07/29/2023 10:39:16 -------------------------------------------------------------------------------- Encounter Discharge Information Details Patient Name: Date of Service: [name], [name] 07/29/2023 10:30 A M Medical Record Number: [id number] Patient Account Number: [id number] Date of Birth/Sex: Treating RN: 1960/11/23 (62 y.o. [name] Primary Care Birney Belshe: [facility] Other Clinician: Referring Truth Wolaver: Treating Lachanda Buczek/Extender: Kathaleen Grinder in Treatment: 10 Encounter Discharge Information Items Post  Procedure Vitals Discharge Condition: Stable Temperature (F): 97.8 Ambulatory Status: Wheelchair Pulse (bpm): 64 Discharge Destination: Home Respiratory Rate (breaths/min): 18 Transportation: Private Auto Blood Pressure (mmHg): 92/59 Accompanied By: spouse Schedule Follow-up Appointment: No Clinical Summary of Care: Patient Declined Electronic Signature(s) Signed: 07/30/2023 7:32:31 AM By: Karie Schwalbe RN Entered By: Karie Schwalbe on 07/30/2023 07:31:58 Beadnell, Pauline F ([id number]) [id number] Page 2 of 10 -------------------------------------------------------------------------------- Lower Extremity Assessment Details Patient Name: Date of Service: [name] 07/29/2023 10:30 A M Medical Record Number: [id number] Patient Account Number: [id number] Date of Birth/Sex: Treating RN: 01-Aug-1961 (62 y.o. [name] Primary Care Shir Bergman: [facility] Other Clinician: Referring Dvid Pendry: Treating Ren Grasse/Extender: [facility] [place] in Treatment: 10 Electronic Signature(s) Signed: 07/30/2023 7:32:31 AM By: Karie Schwalbe RN Entered By: Karie Schwalbe on 07/29/2023 10:41:02 -------------------------------------------------------------------------------- Multi Wound Chart Details Patient Name: Date of Service: [name], [name] 07/29/2023 10:30 A M Medical Record Number: [id number] Patient Account Number: [id number] Date of Birth/Sex: Treating RN: [birth year] (62 y.o. M) Primary Care Kimani Bedoya: [facility] Other Clinician: Referring Jashira Cotugno: Treating Jaslynn Thome/Extender: [facility] [place] in Treatment: 10 Vital Signs Height(in): 70 Pulse(bpm): 64 Weight(lbs): 176 Blood Pressure(mmHg): 92/59 Body Mass Index(BMI): 25.3 Temperature(F): 97.8 Respiratory Rate(breaths/min): 18 [2:Photos:] Medial Back Left Gluteus Right Gluteal fold Wound Location: Gradually  Appeared Gradually Appeared Pressure Injury Wounding Event: Pressure Ulcer Pressure Ulcer Pressure Ulcer Primary Etiology: Asthma Asthma Asthma Comorbid History: 05/14/2022 05/14/2022 05/14/2022 Date Acquired: 10 10 10  Weeks of Treatment: Open Open Open Wound Status: No No No Wound Recurrence: 2.1x1x0.1 4.4x3.1x1.8 2.7x6.2x1 Measurements L x W x D (cm) 1.649 10.713 13.148 A (cm) : rea 0.165 19.283 13.148 Volume (cm) : 23.70% 17.30% 43.30% % Reduction in A rea: 61.80% 57.50% 83.80% % Reduction in Volume: 1 Position 1 (o'clock): 3.8 Maximum Distance 1 (cm): 2 9 Starting Position 1 (o'clock): 4 3 Ending Position 1 (o'clock): 3.5 2.5 Maximum Distance 1 (cm): No Yes  [birth year] (62 y.o. [name] Primary Care Gamaliel Charney: [facility] Other Clinician: Referring Shaida Route: Treating Ephram Kornegay/Extender: [facility] [place] in Treatment: 10 Wound Status Wound Number: 3 Primary Etiology: Pressure Ulcer Wound Location: Left Gluteus Wound Status: Open Wounding Event: Gradually Appeared Comorbid History: Asthma Date Acquired: 05/14/2022 Weeks Of Treatment: 10 Clustered Wound: No Photos Wound Measurements Length: (cm) 4.4 Width: (cm) 3.1 Depth: (cm) 1.8 Area: (cm) 10.713 Volume: (cm) 19.283 % Reduction in Area: 17.3% % Reduction in Volume: 57.5% Tunneling: Yes Position (o'clock): 1 Maximum Distance: (cm) 3.8 Undermining: Yes Starting Position (o'clock): 2 Ending Position (o'clock): 4 Maximum Distance: (cm) 3.5 Wound Description Classification: Category/Stage IV Wound Margin: Distinct, outline attached Exudate Amount: Medium Exudate Type: Serosanguineous Exudate Color: red, brown Foul Odor After Cleansing: No Slough/Fibrino No Wound Bed Granulation Amount: Medium (34-66%) Exposed Structure Granulation Quality: Red, Pink Fat Layer (Subcutaneous Tissue) Exposed: Yes Necrotic Amount: Medium (34-66%) Muscle Exposed: Yes Necrotic Quality: Adherent Slough Necrosis of Muscle: No Bone Exposed: Yes [name], Haley F ([id number]) [id number] Page 8 of 10 Periwound Skin Texture Texture Color No Abnormalities Noted: No No Abnormalities Noted: No Scarring: Yes Hemosiderin Staining: Yes Moisture Temperature / Pain No Abnormalities Noted: No Temperature: No Abnormality Dry / Scaly: No Maceration:  No Treatment Notes Wound #3 (Gluteus) Wound Laterality: Left Cleanser Vashe 5.8 (oz) Discharge Instruction: Cleanse the wound with Vashe prior to applying a clean dressing using gauze sponges, not tissue or cotton balls. Peri-Wound Care Topical Primary Dressing Dakin's Solution 0.25%, 16 (oz) Discharge Instruction: Moisten gauze with Dakin's solution Secondary Dressing Zetuvit Plus Silicone Border Dressing 7x7(in/in) Discharge Instruction: Apply silicone border over primary dressing as directed. Secured With Compression Wrap Compression Stockings Facilities manager) Signed: 07/30/2023 7:32:31 AM By: Karie Schwalbe RN Entered By: Karie Schwalbe on 07/29/2023 11:27:53 -------------------------------------------------------------------------------- Wound Assessment Details Patient Name: Date of Service: [name], [name] 07/29/2023 10:30 A M Medical Record Number: [id number] Patient Account Number: [id number] Date of Birth/Sex: Treating RN: Aug 03, 1961 (62 y.o. [name] Primary Care Jordayn Mink: [facility] Other Clinician: Referring Awilda Covin: Treating Blessings Inglett/Extender: [facility] [place] in Treatment: 10 Wound Status Wound Number: 4 Primary Etiology: Pressure Ulcer Wound Location: Right Gluteal fold Wound Status: Open Wounding Event: Pressure Injury Comorbid History: Asthma Date Acquired: 05/14/2022 Weeks Of Treatment: 10 Clustered Wound: No Photos [name], Zaid F ([id number]) [phone].pdf Page 9 of 10 Wound Measurements Length: (cm) 2.7 Width: (cm) 6.2 Depth: (cm) 1 Area: (cm) 13.148 Volume: (cm) 13.148 % Reduction in Area: 43.3% % Reduction in Volume: 83.8% Tunneling: No Undermining: Yes Starting Position (o'clock): 9 Ending Position (o'clock): 3 Maximum Distance: (cm) 2.5 Wound Description Classification: Category/Stage IV Wound Margin: Distinct, outline attached Exudate  Amount: Medium Exudate Type: Serous Exudate Color: amber Foul Odor After Cleansing: No Slough/Fibrino No Wound Bed Granulation Amount: Medium (34-66%) Exposed Structure Granulation Quality: Red, Pink Fascia Exposed: No Necrotic Amount: Small (1-33%) Fat Layer (Subcutaneous Tissue) Exposed: Yes Necrotic Quality: Adherent Slough Tendon Exposed: No Muscle Exposed: Yes Necrosis of Muscle: No Joint Exposed: No Bone Exposed: No Periwound Skin Texture Texture Color No Abnormalities Noted: Yes No Abnormalities Noted: Yes Moisture No Abnormalities Noted: Yes Treatment Notes Wound #4 (Gluteal fold) Wound Laterality: Right Cleanser Vashe 5.8 (oz) Discharge Instruction: Cleanse the wound with Vashe prior to applying a clean dressing using gauze sponges, not tissue or cotton balls. Peri-Wound Care Topical Primary Dressing Dakin's Solution 0.25%, 16 (oz) Discharge Instruction: Moisten gauze with Dakin's solution Secondary Dressing Zetuvit Plus Silicone  No Rest: Yes Massage: No Activity: No T.E.N.S.: No Heat Application: No Leg drop or elevation: No Is the Current Pain Management Adequate: Adequate How does your wound impact your activities of daily livingo Sleep: No Bathing: No Appetite: No Relationship With Others: No [name], Zerick F ([id number]) [id number].pdf Page 5 of 10 Bladder Continence: No Emotions: No Bowel Continence: No Work: No Toileting: No Drive: No Dressing: No Hobbies: No Electronic  Signature(s) Signed: 07/30/2023 7:32:31 AM By: Karie Schwalbe RN Entered By: Karie Schwalbe on 07/29/2023 10:44:05 -------------------------------------------------------------------------------- Patient/Caregiver Education Details Patient Name: Date of Service: [name] 10/17/2024andnbsp10:30 A M Medical Record Number: [id number] Patient Account Number: [id number] Date of Birth/Gender: Treating RN: 1961-06-07 (62 y.o. [name] Primary Care Physician: [facility] Other Clinician: Referring Physician: Treating Physician/Extender: [facility] [place] in Treatment: 10 Education Assessment Education Provided To: Patient and Caregiver Education Topics Provided Wound/Skin Impairment: Methods: Demonstration, Explain/Verbal Responses: State content correctly Electronic Signature(s) Signed: 07/30/2023 7:32:31 AM By: Karie Schwalbe RN Entered By: Karie Schwalbe on 07/30/2023 07:30:55 -------------------------------------------------------------------------------- Wound Assessment Details Patient Name: Date of Service: [name], [name] 07/29/2023 10:30 A M Medical Record Number: [id number] Patient Account Number: [id number] Date of Birth/Sex: Treating RN: [birth year] (62 y.o. [name] Primary Care Atavia Poppe: [facility] Other Clinician: Referring Linn Goetze: Treating Hendrix Console/Extender: [facility] [place] in Treatment: 10 Wound Status Wound Number: 2 Primary Etiology: Pressure Ulcer Wound Location: Medial Back Wound Status: Open Wounding Event: Gradually Appeared Comorbid History: Asthma Date Acquired: 05/14/2022 Weeks Of Treatment: 10 Clustered Wound: No Photos [name], Christin F ([id number]) [phone].pdf Page 6 of 10 Wound Measurements Length: (cm) 2.1 Width: (cm) 1 Depth: (cm) 0.1 Area: (cm) 1.649 Volume: (cm) 0.165 % Reduction in Area: 23.7% %  Reduction in Volume: 61.8% Epithelialization: Medium (34-66%) Tunneling: No Undermining: No Wound Description Classification: Category/Stage III Wound Margin: Distinct, outline attached Exudate Amount: Medium Exudate Type: Serosanguineous Exudate Color: red, brown Foul Odor After Cleansing: No Slough/Fibrino Yes Wound Bed Granulation Amount: Small (1-33%) Exposed Structure Granulation Quality: Red Fascia Exposed: No Necrotic Amount: Large (67-100%) Fat Layer (Subcutaneous Tissue) Exposed: Yes Necrotic Quality: Adherent Slough Tendon Exposed: No Muscle Exposed: No Joint Exposed: No Bone Exposed: No Periwound Skin Texture Texture Color No Abnormalities Noted: No No Abnormalities Noted: No Callus: No Atrophie Blanche: No Crepitus: No Cyanosis: No Excoriation: No Ecchymosis: No Induration: No Erythema: No Rash: No Hemosiderin Staining: No Scarring: No Mottled: No Pallor: No Moisture Rubor: No No Abnormalities Noted: No Dry / Scaly: No Temperature / Pain Maceration: No Temperature: No Abnormality Treatment Notes Wound #2 (Back) Wound Laterality: Medial Cleanser Vashe 5.8 (oz) Discharge Instruction: Cleanse the wound with Vashe prior to applying a clean dressing using gauze sponges, not tissue or cotton balls. Peri-Wound Care Skin Prep Discharge Instruction: Use skin prep as directed Topical Santyl Collagenase Ointment, 30 (gm), tube Primary Dressing Hydrofera Blue Ready Transfer Foam, 4x5 (in/in) Discharge Instruction: Apply to wound bed as instructed Secondary Dressing Zetuvit Plus Silicone Border Dressing 4x4 (in/in) Discharge Instruction: Apply silicone border over primary dressing as directed. Secured With [place], Dace F ([id number]) [id number] Page 7 of 10 Compression Wrap Compression Stockings Add-Ons Electronic Signature(s) Signed: 07/30/2023 7:32:31 AM By: Karie Schwalbe RN Entered By: Karie Schwalbe on 07/29/2023  11:27:36 -------------------------------------------------------------------------------- Wound Assessment Details Patient Name: Date of Service: [name], [name] 07/29/2023 10:30 A M Medical Record Number: [id number] Patient Account Number: [id number] Date of Birth/Sex: Treating RN:

## 2023-08-12 ENCOUNTER — Encounter (HOSPITAL_BASED_OUTPATIENT_CLINIC_OR_DEPARTMENT_OTHER): Payer: No Typology Code available for payment source | Admitting: Internal Medicine

## 2023-08-12 DIAGNOSIS — L89314 Pressure ulcer of right buttock, stage 4: Secondary | ICD-10-CM | POA: Diagnosis not present

## 2023-08-13 NOTE — Progress Notes (Signed)
[name] ([id number]) [id number] Page 1 of 13 Visit Report for 08/12/2023 Arrival Information Details Patient Name: Date of Service: [name]. 08/12/2023 10:30 A M Medical Record Number: [id number] Patient Account Number: [id number] Date of Birth/Sex: Treating RN: August 25, 1961 (62 y.o. M) Primary Care [name]: Clinic, [name] Other Clinician: Referring [name]: Treating [name]/Extender: [name] Clinic, [name] in Treatment: 12 Visit Information History Since Last Visit Added or deleted any medications: No Patient Arrived: Wheel Chair Any new allergies or adverse reactions: No Arrival Time: 10:48 Had a fall or experienced change in No Accompanied By: self activities of daily living that may affect Transfer Assistance: Michiel Sites Lift risk of falls: Patient Identification Verified: Yes Signs or symptoms of abuse/neglect since last visito No Secondary Verification Process Completed: Yes Hospitalized since last visit: No Patient Requires Transmission-Based Precautions: No Implantable device outside of the clinic excluding No Patient Has Alerts: No cellular tissue based products placed in the center since last visit: Has Dressing in Place as Prescribed: Yes Pain Present Now: No Electronic Signature(s) Signed: 08/12/2023 4:45:23 PM By: Thayer Dallas Entered By: Thayer Dallas on 08/12/2023 10:48:33 -------------------------------------------------------------------------------- Clinic Level of Care Assessment Details Patient Name: Date of Service: [name], [name] 08/12/2023 10:30 A M Medical Record Number: [id number] Patient Account Number: [id number] Date of Birth/Sex: Treating RN: April 11, 1961 (62 y.o. [name] Primary Care [name]: Clinic, [name] Other Clinician: Referring [name]: Treating [name]/Extender: [name] Clinic, [name] in Treatment: 12 Clinic Level of  Care Assessment Items TOOL 4 Quantity Score X- 1 0 Use when only an EandM is performed on FOLLOW-UP visit ASSESSMENTS - Nursing Assessment / Reassessment X- 1 10 Reassessment of Co-morbidities (includes updates in patient status) X- 1 5 Reassessment of Adherence to Treatment Plan ASSESSMENTS - Wound and Skin A ssessment / Reassessment []  - 0 Simple Wound Assessment / Reassessment - one wound X- 4 5 Complex Wound Assessment / Reassessment - multiple wounds []  - 0 Dermatologic / Skin Assessment (not related to wound area) ASSESSMENTS - Focused Assessment []  - 0 Circumferential Edema Measurements - multi extremities []  - 0 Nutritional Assessment / Counseling / Intervention [name] ([id number]) [id number] Page 2 of 13 []  - 0 Lower Extremity Assessment (monofilament, tuning fork, pulses) []  - 0 Peripheral Arterial Disease Assessment (using hand held doppler) ASSESSMENTS - Ostomy and/or Continence Assessment and Care []  - 0 Incontinence Assessment and Management []  - 0 Ostomy Care Assessment and Management (repouching, etc.) PROCESS - Coordination of Care []  - 0 Simple Patient / Family Education for ongoing care X- 1 20 Complex (extensive) Patient / Family Education for ongoing care X- 1 10 Staff obtains Chiropractor, Records, T Results / Process Orders est X- 1 10 Staff telephones HHA, Nursing Homes / Clarify orders / etc []  - 0 Routine Transfer to another Facility (non-emergent condition) []  - 0 Routine Hospital Admission (non-emergent condition) []  - 0 New Admissions / Manufacturing engineer / Ordering NPWT Apligraf, etc. , []  - 0 Emergency Hospital Admission (emergent condition) []  - 0 Simple Discharge Coordination X- 1 15 Complex (extensive) Discharge Coordination PROCESS - Special Needs []  - 0 Pediatric / Minor Patient Management []  - 0 Isolation Patient Management []  - 0 Hearing / Language / Visual special needs []  -  0 Assessment of Community assistance (transportation, D/C planning, etc.) []  - 0 Additional assistance / Altered mentation []  - 0 Support Surface(s) Assessment (bed, cushion, seat, etc.) INTERVENTIONS - Wound Cleansing / Measurement []  -  0 Simple Wound Cleansing - one wound X- 4 5 Complex Wound Cleansing - multiple wounds X- 1 5 Wound Imaging (photographs - any number of wounds) []  - 0 Wound Tracing (instead of photographs) []  - 0 Simple Wound Measurement - one wound X- 4 5 Complex Wound Measurement - multiple wounds INTERVENTIONS - Wound Dressings []  - 0 Small Wound Dressing one or multiple wounds X- 4 15 Medium Wound Dressing one or multiple wounds []  - 0 Large Wound Dressing one or multiple wounds []  - 0 Application of Medications - topical []  - 0 Application of Medications - injection INTERVENTIONS - Miscellaneous []  - 0 External ear exam []  - 0 Specimen Collection (cultures, biopsies, blood, body fluids, etc.) []  - 0 Specimen(s) / Culture(s) sent or taken to Lab for analysis []  - 0 Patient Transfer (multiple staff / Nurse, adult / Similar devices) []  - 0 Simple Staple / Suture removal (25 or less) []  - 0 Complex Staple / Suture removal (26 or more) []  - 0 Hypo / Hyperglycemic Management (close monitor of Blood Glucose) [name], [name] ([id number]) [id number] Page 3 of 13 []  - 0 Ankle / Brachial Index (ABI) - do not check if billed separately X- 1 5 Vital Signs Has the patient been seen at the hospital within the last three years: Yes Total Score: 200 Level Of Care: New/Established - Level 5 Electronic Signature(s) Signed: 08/13/2023 12:02:50 PM By: Mark Schwalbe RN Entered By: [name] on 08/12/2023 14:07:04 -------------------------------------------------------------------------------- Encounter Discharge Information Details Patient Name: Date of Service: [name], [name] 08/12/2023 10:30 A M Medical Record  Number: [id number] Patient Account Number: [id number] Date of Birth/Sex: Treating RN: 1961/03/14 (62 y.o. [name] Primary Care [name]: Clinic, [name] Other Clinician: Referring [name]: Treating Zarion Oliff/Extender: [name] Clinic, [name] in Treatment: 12 Encounter Discharge Information Items Discharge Condition: Stable Ambulatory Status: Wheelchair Discharge Destination: Home Transportation: Private Auto Accompanied By: spouse Schedule Follow-up Appointment: Yes Clinical Summary of Care: Patient Declined Electronic Signature(s) Signed: 08/13/2023 12:02:50 PM By: Mark Schwalbe RN Entered By: [name] on 08/12/2023 14:07:51 -------------------------------------------------------------------------------- Lower Extremity Assessment Details Patient Name: Date of Service: [name], Felipa Emory F. 08/12/2023 10:30 A M Medical Record Number: [id number] Patient Account Number: [id number] Date of Birth/Sex: Treating RN: May 19, 1961 (62 y.o. [name] Primary Care Elspeth Blucher: Clinic, [name] Other Clinician: Referring Rayven Rettig: Treating Jahziel Sinn/Extender: Kathaleen Grinder in Treatment: 12 Electronic Signature(s) Signed: 08/13/2023 12:02:50 PM By: Mark Schwalbe RN Entered By: [name] on 08/12/2023 11:37:41 -------------------------------------------------------------------------------- Multi Wound Chart Details Patient Name: Date of Service: [name], [name] 08/12/2023 10:30 A M Medical Record Number: [id number] Patient Account Number: [id number] GRAYCEN, SADLON ([id number]) [id number].pdf Page 4 of 13 Date of Birth/Sex: Treating RN: Nov 29, 1960 (62 y.o. M) Primary Care Samual Beals: Other Clinician: Clinic, [name] Referring Cheikh Bramble: Treating Celesta Funderburk/Extender: Kathaleen Grinder in Treatment: 12 Vital Signs Height(in): 70 Pulse(bpm):  57 Weight(lbs): 176 Blood Pressure(mmHg): 111/71 Body Mass Index(BMI): 25.3 Temperature(F): 97.8 Respiratory Rate(breaths/min): 18 [2:Photos:] Medial Back Left Gluteus Right Gluteal fold Wound Location: Gradually Appeared Gradually Appeared Pressure Injury Wounding Event: Pressure Ulcer Pressure Ulcer Pressure Ulcer Primary Etiology: Asthma Asthma Asthma Comorbid History: 05/14/2022 05/14/2022 05/14/2022 Date Acquired: 12 12 12  Weeks of Treatment: Open Open Open Wound Status: No No No Wound Recurrence: 1.4x0.5x0.1 4.2x1.5x1.5 2.5x4.3x2 Measurements L x W x D (cm) 0.55 4.948 8.443 A (cm) : rea 0.055 7.422 16.886 Volume (cm) : 74.50% 61.80% 63.60% %  Reduction in A rea: 87.30% 83.60% 79.20% % Reduction in Volume: 1 Position 1 (o'clock): 3.8 Maximum Distance 1 (cm): 2 9 Starting Position 1 (o'clock): 4 3 Ending Position 1 (o'clock): 6 2.3 Maximum Distance 1 (cm): No Yes N/A Tunneling: No Yes Yes Undermining: Category/Stage III Category/Stage IV Category/Stage IV Classification: Medium Medium Medium Exudate A mount: Serosanguineous Serosanguineous Serous Exudate Type: red, brown red, brown amber Exudate Color: Distinct, outline attached Distinct, outline attached Distinct, outline attached Wound Margin: Small (1-33%) Medium (34-66%) Medium (34-66%) Granulation A mount: Red Red, Pink Red, Pink Granulation Quality: Large (67-100%) Medium (34-66%) Small (1-33%) Necrotic A mount: Fat Layer (Subcutaneous Tissue): Yes Fat Layer (Subcutaneous Tissue): Yes Fat Layer (Subcutaneous Tissue): Yes Exposed Structures: Fascia: No Muscle: Yes Muscle: Yes Tendon: No Bone: Yes Fascia: No Muscle: No Tendon: No Joint: No Joint: No Bone: No Bone: No Medium (34-66%) N/A N/A Epithelialization: Scarring: Yes Scarring: Yes No Abnormalities Noted Periwound Skin Texture: Excoriation: No Induration: No Callus: No Crepitus: No Rash: No Maceration: No Maceration: No No  Abnormalities Noted Periwound Skin Moisture: Dry/Scaly: No Dry/Scaly: No Atrophie Blanche: No Hemosiderin Staining: Yes No Abnormalities Noted Periwound Skin Color: Cyanosis: No Ecchymosis: No Erythema: No Hemosiderin Staining: No Mottled: No Pallor: No Rubor: No No Abnormality No Abnormality N/A Temperature: Wound Number: 5 N/A N/A Photos: N/A N/A [name] ([id number]) [id number] Page 5 of 13 Distal, Midline Back N/A N/A Wound Location: Pressure Injury N/A N/A Wounding Event: Pressure Ulcer N/A N/A Primary Etiology: Asthma N/A N/A Comorbid History: 05/13/2023 N/A N/A Date Acquired: 10 N/A N/A Weeks of Treatment: Open N/A N/A Wound Status: No N/A N/A Wound Recurrence: 0.8x0.5x0.1 N/A N/A Measurements L x W x D (cm) 0.314 N/A N/A A (cm) : rea 0.031 N/A N/A Volume (cm) : -166.10% N/A N/A % Reduction in A rea: -158.30% N/A N/A % Reduction in Volume: No N/A N/A Tunneling: No N/A N/A Undermining: Category/Stage II N/A N/A Classification: Medium N/A N/A Exudate A mount: Serosanguineous N/A N/A Exudate Type: red, brown N/A N/A Exudate Color: Distinct, outline attached N/A N/A Wound Margin: Medium (34-66%) N/A N/A Granulation A mount: Red N/A N/A Granulation Quality: Medium (34-66%) N/A N/A Necrotic A mount: Fat Layer (Subcutaneous Tissue): Yes N/A N/A Exposed Structures: Fascia: No Tendon: No Muscle: No Joint: No Bone: No Large (67-100%) N/A N/A Epithelialization: Scarring: Yes N/A N/A Periwound Skin Texture: Excoriation: No Induration: No Callus: No Crepitus: No Rash: No Maceration: No N/A N/A Periwound Skin Moisture: Dry/Scaly: No Atrophie Blanche: No N/A N/A Periwound Skin Color: Cyanosis: No Ecchymosis: No Erythema: No Hemosiderin Staining: No Mottled: No Pallor: No Rubor: No No Abnormality N/A N/A Temperature: Treatment Notes Electronic Signature(s) Signed: 08/12/2023 5:00:10 PM By: Mark Najjar MD Entered By: [name] on 08/12/2023 11:45:46 -------------------------------------------------------------------------------- Multi-Disciplinary Care Plan Details Patient Name: Date of Service: [name], [name] 08/12/2023 10:30 A M Medical Record Number: [id number] Patient Account Number: [id number] Date of Birth/Sex: Treating RN: Mar 04, 1961 (62 y.o. [name] Primary Care Danie Diehl: Clinic, [name] Other Clinician: Referring Jakel Alphin: Treating Court Gracia/Extender: Kathaleen Grinder in Treatment: [address] Inactive Wound/Skin Impairment [place], [place] F ([id number]) [id number] Page 6 of 13 Nursing Diagnoses: Knowledge deficit related to smoking impact on wound healing Goals: Patient/caregiver will verbalize understanding of skin care regimen Date Initiated: 05/18/2023 Target Resolution Date: 10/12/2023 Goal Status: Active Interventions: Assess patient/caregiver ability to obtain necessary supplies Assess patient/caregiver ability to perform ulcer/skin care regimen upon admission and as needed Assess ulceration(s)  every visit Provide education on ulcer and skin care Screen for HBO Treatment Activities: Skin care regimen initiated : 05/18/2023 Topical wound management initiated : 05/18/2023 Notes: Electronic Signature(s) Signed: 08/13/2023 12:02:50 PM By: Mark Schwalbe RN Entered By: [name] on 08/12/2023 14:11:21 -------------------------------------------------------------------------------- Pain Assessment Details Patient Name: Date of Service: [name], [name] 08/12/2023 10:30 A M Medical Record Number: [id number] Patient Account Number: [id number] Date of Birth/Sex: Treating RN: 1961-05-02 (62 y.o. [name] Primary Care Patricio Popwell: Clinic, [name] Other Clinician: Referring Betsi Crespi: Treating Dorisann Schwanke/Extender: Kathaleen Grinder in Treatment:  12 Active Problems Location of Pain Severity and Description of Pain Patient Has Paino Yes Site Locations Pain Location: Generalized Pain With Dressing Change: Yes Duration of the Pain. Constant / Intermittento Constant Rate the pain. Current Pain Level: 5 Worst Pain Level: 10 Least Pain Level: 3 Tolerable Pain Level: 3 Character of Pain Describe the Pain: Difficult to Pinpoint Pain Management and Medication Current Pain Management: Medication: Yes Cold Application: No Rest: Yes Massage: No Activity: No T.E.N.S.: No Heat Application: No Leg drop or elevation: No [name], [name] ([id number]) [id number] Page 7 of 13 Is the Current Pain Management Adequate: Adequate How does your wound impact your activities of daily livingo Sleep: No Bathing: No Appetite: No Relationship With Others: No Bladder Continence: No Emotions: No Bowel Continence: No Work: No Toileting: No Drive: No Dressing: No Hobbies: No Electronic Signature(s) Signed: 08/13/2023 12:02:50 PM By: Mark Schwalbe RN Entered By: [name] on 08/12/2023 11:37:33 -------------------------------------------------------------------------------- Patient/Caregiver Education Details Patient Name: Date of Service: [name] 10/31/2024andnbsp10:30 A M Medical Record Number: [id number] Patient Account Number: [id number] Date of Birth/Gender: Treating RN: 1961/05/31 (62 y.o. [name] Primary Care Physician: Clinic, [name] Other Clinician: Referring Physician: Treating Physician/Extender: [name] Clinic, [name] in Treatment: 12 Education Assessment Education Provided To: Patient Education Topics Provided Wound/Skin Impairment: Methods: Explain/Verbal Responses: State content correctly [employer]) Signed: 08/13/2023 12:02:50 PM By: Mark Schwalbe RN Entered By: [name] on 08/12/2023  14:05:52 -------------------------------------------------------------------------------- Wound Assessment Details Patient Name: Date of Service: [name], [name] 08/12/2023 10:30 A M Medical Record Number: [id number] Patient Account Number: [id number] Date of Birth/Sex: Treating RN: May 07, 1961 (62 y.o. [name] Primary Care Aber[place] Hafen: Clinic, [name] Other Clinician: Referring Stuart Mirabile: Treating Maverick Patman/Extender: [name] Clinic, [name] in Treatment: 12 Wound Status Wound Number: 2 Primary Etiology: Pressure Ulcer Wound Location: Medial Back Wound Status: Open Wounding Event: Gradually Appeared Comorbid History: Asthma Date Acquired: 05/14/2022 Weeks Of Treatment: 12 Clustered Wound: No Photos [name], [name] ([id number]) [id number] Page 8 of 13 Wound Measurements Length: (cm) 1.4 Width: (cm) 0.5 Depth: (cm) 0.1 Area: (cm) 0.55 Volume: (cm) 0.055 % Reduction in Area: 74.5% % Reduction in Volume: 87.3% Epithelialization: Medium (34-66%) Tunneling: No Undermining: No Wound Description Classification: Category/Stage III Wound Margin: Distinct, outline attached Exudate Amount: Medium Exudate Type: Serosanguineous Exudate Color: red, brown Foul Odor After Cleansing: No Slough/Fibrino Yes Wound Bed Granulation Amount: Small (1-33%) Exposed Structure Granulation Quality: Red Fascia Exposed: No Necrotic Amount: Large (67-100%) Fat Layer (Subcutaneous Tissue) Exposed: Yes Necrotic Quality: Adherent Slough Tendon Exposed: No Muscle Exposed: No Joint Exposed: No Bone Exposed: No Periwound Skin Texture Texture Color No Abnormalities Noted: No No Abnormalities Noted: No Callus: No Atrophie Blanche: No Crepitus: No Cyanosis: No Excoriation: No Ecchymosis: No Induration: No Erythema: No Rash: No Hemosiderin Staining: No Scarring: Yes Mottled: No Pallor: No Moisture Rubor: No  No Abnormalities  Noted: No Dry / Scaly: No Temperature / Pain Maceration: No Temperature: No Abnormality Treatment Notes Wound #2 (Back) Wound Laterality: Medial Cleanser Soap and Water Discharge Instruction: May shower and wash wound with dial antibacterial soap and water prior to dressing change. Vashe 5.8 (oz) Discharge Instruction: or Cleanse the wound with Vashe prior to applying a clean dressing using gauze sponges, not tissue or cotton balls. Peri-Wound Care Topical Primary Dressing Hydrofera Blue Ready Transfer Foam, 2.5x2.5 (in/in) Discharge Instruction: Apply directly to wound bed as directed Secondary Dressing Zetuvit Plus Silicone Border Dressing 4x4 (in/in) Discharge Instruction: Apply silicone border over primary dressing as directed. Secured With [name], [name] ([id number]) [id number] Page 9 of 13 Compression Wrap Compression Stockings Add-Ons Electronic Signature(s) Signed: 08/13/2023 12:02:50 PM By: Mark Schwalbe RN Entered By: [name] on 08/12/2023 11:20:01 -------------------------------------------------------------------------------- Wound Assessment Details Patient Name: Date of Service: [name], [name] 08/12/2023 10:30 A M Medical Record Number: [id number] Patient Account Number: [id number] Date of Birth/Sex: Treating RN: [birth year] (62 y.o. [name] Primary Care Luismiguel Lamere: Clinic, [name] Other Clinician: Referring Camerin Ladouceur: Treating Deklyn Trachtenberg/Extender: [name] Clinic, [name] in Treatment: 12 Wound Status Wound Number: 3 Primary Etiology: Pressure Ulcer Wound Location: Left Gluteus Wound Status: Open Wounding Event: Gradually Appeared Comorbid History: Asthma Date Acquired: 05/14/2022 Weeks Of Treatment: 12 Clustered Wound: No Photos Wound Measurements Length: (cm) 4.2 Width: (cm) 1.5 Depth: (cm) 1.5 Area: (cm) 4.948 Volume: (cm) 7.422 % Reduction in Area: 61.8% % Reduction in  Volume: 83.6% Tunneling: Yes Position (o'clock): 1 Maximum Distance: (cm) 3.8 Undermining: Yes Starting Position (o'clock): 2 Ending Position (o'clock): 4 Maximum Distance: (cm) 6 Wound Description Classification: Category/Stage IV Wound Margin: Distinct, outline attached Exudate Amount: Medium Exudate Type: Serosanguineous Exudate Color: red, brown Foul Odor After Cleansing: No Slough/Fibrino No Wound Bed Granulation Amount: Medium (34-66%) Exposed Structure Granulation Quality: Red, Pink Fat Layer (Subcutaneous Tissue) Exposed: Yes Necrotic Amount: Medium (34-66%) Muscle Exposed: Yes Necrotic Quality: Adherent Slough Necrosis of Muscle: No Bone Exposed: Yes [name], [name] ([id number]) [id number] Page 10 of 13 Periwound Skin Texture Texture Color No Abnormalities Noted: No No Abnormalities Noted: No Scarring: Yes Hemosiderin Staining: Yes Moisture Temperature / Pain No Abnormalities Noted: No Temperature: No Abnormality Dry / Scaly: No Maceration: No Treatment Notes Wound #3 (Gluteus) Wound Laterality: Left Cleanser Vashe 5.8 (oz) Discharge Instruction: Cleanse the wound with Vashe prior to applying a clean dressing using gauze sponges, not tissue or cotton balls. Peri-Wound Care Topical Primary Dressing Dakin's Solution 0.25%, 16 (oz) Discharge Instruction: Moisten gauze with Dakin's solution Secondary Dressing Zetuvit Plus Silicone Border Dressing 7x7(in/in) Discharge Instruction: Apply silicone border over primary dressing as directed. Secured With Compression Wrap Compression Stockings Facilities manager) Signed: 08/13/2023 12:02:50 PM By: Mark Schwalbe RN Entered By: [name] on 08/12/2023 11:20:46 -------------------------------------------------------------------------------- Wound Assessment Details Patient Name: Date of Service: [name], [name] 08/12/2023 10:30 A M Medical Record Number:  [id number] Patient Account Number: [id number] Date of Birth/Sex: Treating RN: 1961/03/19 (62 y.o. [name] Primary Care Rahmir Beever: Clinic, [name] Other Clinician: Referring Kimbree Casanas: Treating Rahi Chandonnet/Extender: [name] Clinic, [name] in Treatment: 12 Wound Status Wound Number: 4 Primary Etiology: Pressure Ulcer Wound Location: Right Gluteal fold Wound Status: Open Wounding Event: Pressure Injury Comorbid History: Asthma Date Acquired: 05/14/2022 Weeks Of Treatment: 12 Clustered Wound: No Photos [name], [name] ([id number]) [phone].pdf Page 11 of 13 Wound Measurements Length: (cm) 2.5 Width: (cm) 4.3 Depth: (cm) 2  Area: (cm) 8.443 Volume: (cm) 16.886 % Reduction in Area: 63.6% % Reduction in Volume: 79.2% Undermining: Yes Starting Position (o'clock): 9 Ending Position (o'clock): 3 Maximum Distance: (cm) 2.3 Wound Description Classification: Category/Stage IV Wound Margin: Distinct, outline attached Exudate Amount: Medium Exudate Type: Serous Exudate Color: amber Foul Odor After Cleansing: No Slough/Fibrino No Wound Bed Granulation Amount: Medium (34-66%) Exposed Structure Granulation Quality: Red, Pink Fascia Exposed: No Necrotic Amount: Small (1-33%) Fat Layer (Subcutaneous Tissue) Exposed: Yes Necrotic Quality: Adherent Slough Tendon Exposed: No Muscle Exposed: Yes Necrosis of Muscle: No Joint Exposed: No Bone Exposed: No Periwound Skin Texture Texture Color No Abnormalities Noted: Yes No Abnormalities Noted: Yes Moisture No Abnormalities Noted: Yes Treatment Notes Wound #4 (Gluteal fold) Wound Laterality: Right Cleanser Vashe 5.8 (oz) Discharge Instruction: Cleanse the wound with Vashe prior to applying a clean dressing using gauze sponges, not tissue or cotton balls. Peri-Wound Care Topical Primary Dressing Dakin's Solution 0.25%, 16 (oz) Discharge Instruction: Moisten gauze with Dakin's  solution Secondary Dressing Zetuvit Plus Silicone Border Dressing 7x7(in/in) Discharge Instruction: Apply silicone border over primary dressing as directed. Secured With Compression Wrap Compression Stockings Facilities manager) Signed: 08/13/2023 12:02:50 PM By: Mark Schwalbe RN [name], [name] ([id number]) PM By: Mark Schwalbe RN [phone].pdf Page 12 of 13 Signed: 08/13/2023 12:02:50 Entered By: [name] on 08/12/2023 11:21:35 -------------------------------------------------------------------------------- Wound Assessment Details Patient Name: Date of Service: [name]. 08/12/2023 10:30 A M Medical Record Number: [id number] Patient Account Number: [id number] Date of Birth/Sex: Treating RN: 24-Aug-1961 (62 y.o. [name] Primary Care Law Corsino: Clinic, [name] Other Clinician: Referring Tyneshia Stivers: Treating Oshae Simmering/Extender: [name] Clinic, [name] in Treatment: 12 Wound Status Wound Number: 5 Primary Etiology: Pressure Ulcer Wound Location: Distal, Midline Back Wound Status: Open Wounding Event: Pressure Injury Comorbid History: Asthma Date Acquired: 05/13/2023 Weeks Of Treatment: 10 Clustered Wound: No Photos Wound Measurements Length: (cm) 0.8 Width: (cm) 0.5 Depth: (cm) 0.1 Area: (cm) 0.314 Volume: (cm) 0.031 % Reduction in Area: -166.1% % Reduction in Volume: -158.3% Epithelialization: Large (67-100%) Tunneling: No Undermining: No Wound Description Classification: Category/Stage II Wound Margin: Distinct, outline attached Exudate Amount: Medium Exudate Type: Serosanguineous Exudate Color: red, brown Foul Odor After Cleansing: No Slough/Fibrino Yes Wound Bed Granulation Amount: Medium (34-66%) Exposed Structure Granulation Quality: Red Fascia Exposed: No Necrotic Amount: Medium (34-66%) Fat Layer (Subcutaneous Tissue) Exposed: Yes Necrotic Quality: Adherent  Slough Tendon Exposed: No Muscle Exposed: No Joint Exposed: No Bone Exposed: No Periwound Skin Texture Texture Color No Abnormalities Noted: No No Abnormalities Noted: No Callus: No Atrophie Blanche: No Crepitus: No Cyanosis: No Excoriation: No Ecchymosis: No Induration: No Erythema: No Rash: No Hemosiderin Staining: No Scarring: Yes Mottled: No Pallor: No Moisture [name], [name] ([id number]) [id number] Page 13 of 13 Rubor: No No Abnormalities Noted: Yes Temperature / Pain Temperature: No Abnormality Treatment Notes Wound #5 (Back) Wound Laterality: Midline, Distal Cleanser Soap and Water Discharge Instruction: May shower and wash wound with dial antibacterial soap and water prior to dressing change. Vashe 5.8 (oz) Discharge Instruction: or Cleanse the wound with Vashe prior to applying a clean dressing using gauze sponges, not tissue or cotton balls. Peri-Wound Care Topical Primary Dressing Hydrofera Blue Ready Transfer Foam, 2.5x2.5 (in/in) Discharge Instruction: Apply directly to wound bed as directed Secondary Dressing Zetuvit Plus Silicone Border Dressing 4x4 (in/in) Discharge Instruction: Apply silicone border over primary dressing as directed. Secured With Compression Wrap Compression Stockings Facilities manager) Signed: 08/13/2023 12:02:50 PM By: Mark Schwalbe RN  Entered By: [name] on 08/12/2023 11:22:12 -------------------------------------------------------------------------------- Vitals Details Patient Name: Date of Service: [name], [name] 08/12/2023 10:30 A M Medical Record Number: [id number] Patient Account Number: [id number] Date of Birth/Sex: Treating RN: 08/20/1961 (62 y.o. M) Primary Care Shalaya Swailes: [facility], [name] Other Clinician: Referring Dyllan Hughett: Treating Linn Goetze/Extender: Kathaleen Grinder in Treatment: 12 Vital Signs Time Taken:  10:49 Temperature (F): 97.8 Height (in): 70 Pulse (bpm): 57 Weight (lbs): 176 Respiratory Rate (breaths/min): 18 Body Mass Index (BMI): 25.3 Blood Pressure (mmHg): 111/71 Reference Range: 80 - 120 mg / dl Electronic Signature(s) Signed: 08/12/2023 4:45:23 PM By: Thayer Dallas Entered By: Thayer Dallas on 08/12/2023 10:49:49

## 2023-08-13 NOTE — Progress Notes (Signed)
[name] ([id number]) [id number] Page 1 of 7 Visit Report for 08/12/2023 HPI Details Patient Name: Date of Service: [name]. 08/12/2023 10:30 [name] Medical Record Number: [id number] Patient Account Number: [id number] Date of Birth/Sex: Treating RN: 27-Jun-1961 (62 y.o. [name]) Primary Care Provider: Clinic, [name] Other Clinician: Referring Provider: Treating Provider/Extender: [name] in Treatment: 12 History of Present Illness HPI Description: 05/18/2023 [name] is Mark 62 year old male with Mark past medical history of multiple sclerosis and neurogenic bladder with chronic indwelling Foley catheter that presents to the clinic for Mark 1 year history of nonhealing ulcers to the bilateral ischium As well as upper back. He has an air mattress. He spends about 50% of the time in the bed and 50% of the time in the wheelchair. He does not have Mark Roho cushion. He has been using Medihoney and Santyl to the wound beds. He currently denies systemic signs of infection. Of note he was hospitalized on 2 occasions in January 2024 for trigeminal neuralgia. He has lost Mark significant amount of weight due to this and his oral intake has declined. He was evaluated for PEG tube but was not Mark candidate. 8/19; patient presents for follow-up. Patient has home health. He has been using Dakin's wet-to-dry dressings to the ischial wounds and Santyl to the back wound. Patient was recently hospitalized for UTI and received IV Zosyn for 3 days followed by fosfomycin. He completed 6 days of antibiotics. He currently denies systemic signs of infection. He also had Mark portable pelvic x-ray that did not show any obvious erosive changes. Wife is presentAnd she states she is trying to aggressively offload the wound beds by repositioning him on his sides. Unfortunately he has developed two wounds 1 to the right buttocks and 1 to the midline  back. 9/5; patient presents for follow-up. Wife is present. She has been using Dakin's wet-to-dry dressings to the ischial wounds and Santyl to the back wounds and right buttocks wound. Overall wounds have improved in appearance and size since last clinic visit. 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 He is here with his wife. 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 His wife tells me she gets him up in the wheelchair at about 7:00 in the morning and he is there till 5:00 at night when they put him to bed. He is apparently eating and drinking well. We have been using Dakin's wet to dry did the stage IV wounds on his ischial tuberosities and Santyl with Hydrofera Blue to Mark small area on his back at roughly T4 10/3 patient with advanced multiple sclerosis. He has stage IV wounds over both ischial tuberosities and more superficial area on his back. He has been using Dakin's wet-to-dry on the ischial tuberosity wounds Santyl with Hydrofera Blue to the wound on his back 10/7; patient with advanced multiple sclerosis he has stage IV wounds over [name] at the ischial tuberosities more superficial areas on his spinal process. He still has an open area on the back. He has been using Dakin's wet-to-dry on the ischial tuberosity wounds Santyl with Hydrofera Blue to the wound on his back that still remains 10/31; advanced multiple sclerosis with bilateral stage IV wounds over the ischial tuberosities bilaterally. He has 2 areas on his back 1 was  Mark new reopening this time. Been using Hydrofera Blue and Santyl to the areas on his back and Dakin's wet-to-dry to the ischial tuberosity wounds. Electronic Signature(s) Signed: 08/12/2023 5:00:10 PM By: Baltazar Najjar MD Entered By: Baltazar Najjar on 08/12/2023  11:46:29 -------------------------------------------------------------------------------- Physical Exam Details Patient Name: Date of Service: [name], [name]. 08/12/2023 10:30 [name] Medical Record Number: [id number] Patient Account Number: [id number] Date of Birth/Sex: Treating RN: 1961-04-08 (62 y.o. [name]) Primary Care Provider: Clinic, [name] Other Clinician: Referring Provider: Treating Provider/Extender: [name] in Treatment: 12 Constitutional Sitting or standing Blood Pressure is within target range for patient.. Pulse regular and within target range for patient.Marland Kitchen Respirations regular, non-labored and within target range.. Temperature is normal and within the target range for the patient.Marland Kitchen Appears in no distress. Notes ARLEE, SANTOSUOSSO ([id number]) [id number] Page 2 of 7 Wound exam; back wound had some nonviable surface and some surrounding loss of epithelium. I think this was Mark little worse this time. Lower down there is been Mark reopening. Both ischial tuberosity wounds appear to be healthy with healthy granulation there is still some exposed bone on the left but not the right these may be closing in someone as there is no circumferential loss of tissue around the ischial tuberosities on either side and no evidence of infection. I do not believe that these areas could have Mark wound VAC because their proximity to his scrotum is just to small the distant Electronic Signature(s) Signed: 08/12/2023 5:00:10 PM By: Baltazar Najjar MD Entered By: Baltazar Najjar on 08/12/2023 11:48:05 -------------------------------------------------------------------------------- Physician Orders Details Patient Name: Date of Service: [name], [name]. 08/12/2023 10:30 [name] Medical Record Number: [id number] Patient Account Number: [id number] Date of Birth/Sex: Treating RN: 11-12-1960 (62 y.o. [name] Primary Care Provider:  Clinic, [name] Other Clinician: Referring Provider: Treating Provider/Extender: Baltazar Najjar [facility], [place] [facility] in Treatment: 12 Verbal / Phone Orders: No Diagnosis Coding Follow-up Appointments ppointment in 2 weeks. - ****HOYER ***extra time 60 minutes Dr.Jeyla Bulger Thursday 08/26/23 at 10:30am Return Mark room 9 Return appointment in 1 month. - Pleas ask front desk for appointment Anesthetic (In clinic) Topical Lidocaine 4% applied to wound bed Bathing/ Shower/ Hygiene Other Bathing/Shower/Hygiene Orders/Instructions: - May bathe with soap and water Off-Loading Turn and reposition every 2 hours Other: - only an hour up to eat and back to bed. Turn every 2 hours. Best to stay in bed turn side to side and back one postition for max of 2 hrs Home Health New wound care orders this week; continue Home Health for wound care. May utilize formulary equivalent dressing for wound treatment orders unless otherwise specified. - Dakin's solution wet to dry to wounds - Left Gluteus and Right Gluteal Fold and Hydrafera Blue Ready bordered foam to both back wounds. Other Home Health Orders/Instructions: - Please see the patient at least once Mark week OR please see the patient's spouse regarding wound supplies ( to order supplies for the patient) [facility] Wound Treatment Wound #2 - Back Wound Laterality: Medial Cleanser: Soap and Water 1 x Per Day/30 Days Discharge Instructions: May shower and wash wound with dial antibacterial soap and water prior to dressing change. Cleanser: Vashe 5.8 (oz) 1 x Per Day/30 Days Discharge Instructions: or Cleanse the wound with Vashe prior to applying Mark clean dressing using gauze sponges, not tissue or cotton balls. Prim Dressing: Hydrofera Blue Ready Transfer Foam, 2.5x2.5 (in/in) 1 x Per Day/30 Days ary  Discharge Instructions: Apply directly to wound bed as directed Secondary Dressing: Zetuvit Plus Silicone Border Dressing 4x4 (in/in) 1 x  Per Day/30 Days Discharge Instructions: Apply silicone border over primary dressing as directed. Wound #3 - Gluteus Wound Laterality: Left Cleanser: Vashe 5.8 (oz) (Home Health) (Generic) 1 x Per Day/30 Days Discharge Instructions: Cleanse the wound with Vashe prior to applying Mark clean dressing using gauze sponges, not tissue or cotton balls. Prim Dressing: Dakin's Solution 0.25%, 16 (oz) (Home Health) 1 x Per Day/30 Days ary Discharge Instructions: Moisten gauze with Dakin's solution Secondary Dressing: Zetuvit Plus Silicone Border Dressing 7x7(in/in) (Home Health) 1 x Per Day/30 Days Discharge Instructions: Apply silicone border over primary dressing as directed. [name] ([id number]) [id number] Page 3 of 7 Wound #4 - Gluteal fold Wound Laterality: Right Cleanser: Vashe 5.8 (oz) (Home Health) (Generic) 1 x Per Day/30 Days Discharge Instructions: Cleanse the wound with Vashe prior to applying Mark clean dressing using gauze sponges, not tissue or cotton balls. Prim Dressing: Dakin's Solution 0.25%, 16 (oz) (Home Health) 1 x Per Day/30 Days ary Discharge Instructions: Moisten gauze with Dakin's solution Secondary Dressing: Zetuvit Plus Silicone Border Dressing 7x7(in/in) (Home Health) 1 x Per Day/30 Days Discharge Instructions: Apply silicone border over primary dressing as directed. Wound #5 - Back Wound Laterality: Midline, Distal Cleanser: Soap and Water 1 x Per Day/30 Days Discharge Instructions: May shower and wash wound with dial antibacterial soap and water prior to dressing change. Cleanser: Vashe 5.8 (oz) 1 x Per Day/30 Days Discharge Instructions: or Cleanse the wound with Vashe prior to applying Mark clean dressing using gauze sponges, not tissue or cotton balls. Prim Dressing: Hydrofera Blue Ready Transfer Foam, 2.5x2.5 (in/in) 1 x Per Day/30 Days ary Discharge Instructions: Apply directly to wound bed as directed Secondary Dressing: Zetuvit Plus  Silicone Border Dressing 4x4 (in/in) 1 x Per Day/30 Days Discharge Instructions: Apply silicone border over primary dressing as directed. Electronic Signature(s) Signed: 08/12/2023 5:00:10 PM By: Baltazar Najjar MD Signed: 08/13/2023 12:02:50 PM By: Karie Schwalbe RN Entered By: Karie Schwalbe on 08/12/2023 14:11:14 -------------------------------------------------------------------------------- Problem List Details Patient Name: Date of Service: [name], [name]. 08/12/2023 10:30 [name] Medical Record Number: [id number] Patient Account Number: [id number] Date of Birth/Sex: Treating RN: 13-Nov-1960 (62 y.o. [name]) Primary Care Provider: Clinic, [name] Other Clinician: Referring Provider: Treating Provider/Extender: Baltazar Najjar [facility], [place] [facility] in Treatment: 12 Active Problems ICD-10 Encounter Code Description Active Date MDM Diagnosis L89.314 Pressure ulcer of right buttock, stage 4 05/18/2023 No Yes L89.324 Pressure ulcer of left buttock, stage 4 05/18/2023 No Yes L89.312 Pressure ulcer of right buttock, stage 2 05/31/2023 No Yes L89.103 Pressure ulcer of unspecified part of back, stage 3 05/18/2023 No Yes L89.102 Pressure ulcer of unspecified part of back, stage 2 05/31/2023 No Yes E44.0 Moderate protein-calorie malnutrition 05/18/2023 No Yes [name], [name] ([id number]) [id number] Page 4 of 7 G35 Multiple sclerosis 05/18/2023 No Yes N31.9 Neuromuscular dysfunction of bladder, unspecified 05/18/2023 No Yes Inactive Problems Resolved Problems Electronic Signature(s) Signed: 08/12/2023 5:00:10 PM By: Baltazar Najjar MD Entered By: Baltazar Najjar on 08/12/2023 11:45:40 -------------------------------------------------------------------------------- Progress Note Details Patient Name: Date of Service: [name], [name]. 08/12/2023 10:30 [name] Medical Record Number: [id number] Patient Account Number: [id number] Date of Birth/Sex: Treating  RN: 1960-10-16 (62 y.o. [name]) Primary Care Provider: Clinic, [name] Other Clinician: Referring Provider: Treating Provider/Extender: Baltazar Najjar [facility], [place] [facility] in Treatment: 12 Subjective History of Present Illness (HPI) 05/18/2023 Mr. Madelaine Bhat  Margaretmary Bayley is Mark 62 year old male with Mark past medical history of multiple sclerosis and neurogenic bladder with chronic indwelling Foley catheter that presents to the clinic for Mark 1 year history of nonhealing ulcers to the bilateral ischium As well as upper back. He has an air mattress. He spends about 50% of the time in the bed and 50% of the time in the wheelchair. He does not have Mark Roho cushion. He has been using Medihoney and Santyl to the wound beds. He currently denies systemic signs of infection. Of note he was hospitalized on 2 occasions in January 2024 for trigeminal neuralgia. He has lost Mark significant amount of weight due to this and his oral intake has declined. He was evaluated for PEG tube but was not Mark candidate. 8/19; patient presents for follow-up. Patient has home health. He has been using Dakin's wet-to-dry dressings to the ischial wounds and Santyl to the back wound. Patient was recently hospitalized for UTI and received IV Zosyn for 3 days followed by fosfomycin. He completed 6 days of antibiotics. He currently denies systemic signs of infection. He also had Mark portable pelvic x-ray that did not show any obvious erosive changes. Wife is presentAnd she states she is trying to aggressively offload the wound beds by repositioning him on his sides. Unfortunately he has developed two wounds 1 to the right buttocks and 1 to the midline back. 9/5; patient presents for follow-up. Wife is present. She has been using Dakin's wet-to-dry dressings to the ischial wounds and Santyl to the back wounds and right buttocks wound. Overall wounds have improved in appearance and size since last clinic visit. 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 He is here with his wife. 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 His wife tells me she gets him up in the wheelchair at about 7:00 in the morning and he is there till 5:00 at night when they put him to bed. He is apparently eating and drinking well. We have been using Dakin's wet to dry did the stage IV wounds on his ischial tuberosities and Santyl with Hydrofera Blue to Mark small area on his back at roughly T4 10/3 patient with advanced multiple sclerosis. He has stage IV wounds over both ischial tuberosities and more superficial area on his back. He has been using Dakin's wet-to-dry on the ischial tuberosity wounds Santyl with Hydrofera Blue to the wound on his back 10/7; patient with advanced multiple sclerosis he has stage IV wounds over [name] at the ischial tuberosities more superficial areas on his spinal process. He still has an open area on the back. He has been using Dakin's wet-to-dry on the ischial tuberosity wounds Santyl with Hydrofera Blue to the wound on his back that still remains 10/31; advanced multiple sclerosis with bilateral stage IV wounds over the ischial tuberosities bilaterally. He has 2 areas on his back 1 was Mark new reopening this time. Been using Hydrofera Blue and Santyl to the areas on his back and Dakin's wet-to-dry to the ischial tuberosity wounds. Vinson [name], [name] ([id number]) [id number] Page 5 of 7 Constitutional Sitting or standing Blood Pressure is within target range for patient.. Pulse regular and within target range for patient.Marland Kitchen Respirations regular, non-labored and within target range.. Temperature is normal and within the target range for the patient.Marland Kitchen Appears in no  distress. Vitals Time Taken: 10:49 AM, Height: 70 in, Weight: 176 lbs,  BMI: 25.3, Temperature: 97.8 [name], Pulse: 57 bpm, Respiratory Rate: 18 breaths/min, Blood Pressure: 111/71 mmHg. General Notes: Wound exam; back wound had some nonviable surface and some surrounding loss of epithelium. I think this was Mark little worse this time. Lower down there is been Mark reopening. Both ischial tuberosity wounds appear to be healthy with healthy granulation there is still some exposed bone on the left but not the right these may be closing in someone as there is no circumferential loss of tissue around the ischial tuberosities on either side and no evidence of infection. I do not believe that these areas could have Mark wound VAC because their proximity to his scrotum is just to small the distant Integumentary (Hair, Skin) Wound #2 status is Open. Original cause of wound was Gradually Appeared. The date acquired was: 05/14/2022. The wound has been in treatment 12 weeks. The wound is located on the Medial Back. The wound measures 1.4cm length x 0.5cm width x 0.1cm depth; 0.55cm^2 area and 0.055cm^3 volume. There is Fat Layer (Subcutaneous Tissue) exposed. There is no tunneling or undermining noted. There is Mark medium amount of serosanguineous drainage noted. The wound margin is distinct with the outline attached to the wound base. There is small (1-33%) red granulation within the wound bed. There is Mark large (67-100%) amount of necrotic tissue within the wound bed including Adherent Slough. The periwound skin appearance exhibited: Scarring. The periwound skin appearance did not exhibit: Callus, Crepitus, Excoriation, Induration, Rash, Dry/Scaly, Maceration, Atrophie Blanche, Cyanosis, Ecchymosis, Hemosiderin Staining, Mottled, Pallor, Rubor, Erythema. Periwound temperature was noted as No Abnormality. Wound #3 status is Open. Original cause of wound was Gradually Appeared. The date acquired was: 05/14/2022. The wound has been in treatment 12 weeks. The wound is located on the Left Gluteus. The  wound measures 4.2cm length x 1.5cm width x 1.5cm depth; 4.948cm^2 area and 7.422cm^3 volume. There is bone, muscle, and Fat Layer (Subcutaneous Tissue) exposed. Tunneling has been noted at 1:00 with Mark maximum distance of 3.8cm. Undermining begins at 2:00 and ends at 4:00 with Mark maximum distance of 6cm. There is Mark medium amount of serosanguineous drainage noted. The wound margin is distinct with the outline attached to the wound base. There is medium (34-66%) red, pink granulation within the wound bed. There is Mark medium (34-66%) amount of necrotic tissue within the wound bed including Adherent Slough. The periwound skin appearance exhibited: Scarring, Hemosiderin Staining. The periwound skin appearance did not exhibit: Dry/Scaly, Maceration. Periwound temperature was noted as No Abnormality. Wound #4 status is Open. Original cause of wound was Pressure Injury. The date acquired was: 05/14/2022. The wound has been in treatment 12 weeks. The wound is located on the Right Gluteal fold. The wound measures 2.5cm length x 4.3cm width x 2cm depth; 8.443cm^2 area and 16.886cm^3 volume. There is muscle and Fat Layer (Subcutaneous Tissue) exposed. There is undermining starting at 9:00 and ending at 3:00 with Mark maximum distance of 2.3cm. There is Mark medium amount of serous drainage noted. The wound margin is distinct with the outline attached to the wound base. There is medium (34-66%) red, pink granulation within the wound bed. There is Mark small (1-33%) amount of necrotic tissue within the wound bed including Adherent Slough. The periwound skin appearance had no abnormalities noted for texture. The periwound skin appearance had no abnormalities noted for moisture. The periwound skin appearance had no abnormalities noted for color. Wound #  5 status is Open. Original cause of wound was Pressure Injury. The date acquired was: 05/13/2023. The wound has been in treatment 10 weeks. The wound is located on the Distal,Midline  Back. The wound measures 0.8cm length x 0.5cm width x 0.1cm depth; 0.314cm^2 area and 0.031cm^3 volume. There is Fat Layer (Subcutaneous Tissue) exposed. There is no tunneling or undermining noted. There is Mark medium amount of serosanguineous drainage noted. The wound margin is distinct with the outline attached to the wound base. There is medium (34-66%) red granulation within the wound bed. There is Mark medium (34- 66%) amount of necrotic tissue within the wound bed including Adherent Slough. The periwound skin appearance had no abnormalities noted for moisture. The periwound skin appearance exhibited: Scarring. The periwound skin appearance did not exhibit: Callus, Crepitus, Excoriation, Induration, Rash, Atrophie Blanche, Cyanosis, Ecchymosis, Hemosiderin Staining, Mottled, Pallor, Rubor, Erythema. Periwound temperature was noted as No Abnormality. Assessment Active Problems ICD-10 Pressure ulcer of right buttock, stage 4 Pressure ulcer of left buttock, stage 4 Pressure ulcer of right buttock, stage 2 Pressure ulcer of unspecified part of back, stage 3 Pressure ulcer of unspecified part of back, stage 2 Moderate protein-calorie malnutrition Multiple sclerosis Neuromuscular dysfunction of bladder, unspecified Plan Follow-up Appointments: Return Appointment in 2 weeks. - ****HOYER ***extra time 60 minutes Dr.Kalina Morabito Thursday 08/26/23 at 10:30am room 9 Return appointment in 1 month. - Pleas ask front desk for appointment Anesthetic: (In clinic) Topical Lidocaine 4% applied to wound bed Bathing/ Shower/ Hygiene: Other Bathing/Shower/Hygiene Orders/Instructions: - May bathe with soap and water Off-Loading: Turn and reposition every 2 hours Other: - only an hour up to eat and back to bed. Turn every 2 hours. Best to stay in bed turn side to side and back one postition for max of 2 hrs Home Health: New wound care orders this week; continue Home Health for wound care. May utilize formulary  equivalent dressing for wound treatment orders unless otherwise specified. - Dakin's solution wet to dry to wounds #4 and #6. Santyl and hydrofera blue bordered foam to both back wounds. Other Home Health Orders/Instructions: - Please see the patient at least once Mark week OR please see the patient's spouse regarding wound supplies ( to order supplies for the patient) [facility] WOUND #2: - Back Wound Laterality: Medial Cleanser: Soap and Water 1 x Per Day/30 Days Discharge Instructions: May shower and wash wound with dial antibacterial soap and water prior to dressing change. Cleanser: Vashe 5.8 (oz) 1 x Per Day/30 Days [name], [name] ([id number]) [phone].pdf Page 6 of 7 Discharge Instructions: or Cleanse the wound with Vashe prior to applying Mark clean dressing using gauze sponges, not tissue or cotton balls. Prim Dressing: Hydrofera Blue Ready Transfer Foam, 2.5x2.5 (in/in) 1 x Per Day/30 Days ary Discharge Instructions: Apply directly to wound bed as directed Secondary Dressing: Zetuvit Plus Silicone Border Dressing 4x4 (in/in) 1 x Per Day/30 Days Discharge Instructions: Apply silicone border over primary dressing as directed. WOUND #3: - Gluteus Wound Laterality: Left Cleanser: Vashe 5.8 (oz) (Home Health) (Generic) 1 x Per Day/30 Days Discharge Instructions: Cleanse the wound with Vashe prior to applying Mark clean dressing using gauze sponges, not tissue or cotton balls. Prim Dressing: Dakin's Solution 0.25%, 16 (oz) (Home Health) 1 x Per Day/30 Days ary Discharge Instructions: Moisten gauze with Dakin's solution Secondary Dressing: Zetuvit Plus Silicone Border Dressing 7x7(in/in) (Home Health) 1 x Per Day/30 Days Discharge Instructions: Apply silicone border over primary dressing as directed. WOUND #4: - Gluteal  fold Wound Laterality: Right Cleanser: Vashe 5.8 (oz) (Home Health) (Generic) 1 x Per Day/30 Days Discharge Instructions: Cleanse the wound  with Vashe prior to applying Mark clean dressing using gauze sponges, not tissue or cotton balls. Prim Dressing: Dakin's Solution 0.25%, 16 (oz) (Home Health) 1 x Per Day/30 Days ary Discharge Instructions: Moisten gauze with Dakin's solution Secondary Dressing: Zetuvit Plus Silicone Border Dressing 7x7(in/in) (Home Health) 1 x Per Day/30 Days Discharge Instructions: Apply silicone border over primary dressing as directed. WOUND #5: - Back Wound Laterality: Midline, Distal Cleanser: Soap and Water 1 x Per Day/30 Days Discharge Instructions: May shower and wash wound with dial antibacterial soap and water prior to dressing change. Cleanser: Vashe 5.8 (oz) 1 x Per Day/30 Days Discharge Instructions: or Cleanse the wound with Vashe prior to applying Mark clean dressing using gauze sponges, not tissue or cotton balls. Prim Dressing: Hydrofera Blue Ready Transfer Foam, 2.5x2.5 (in/in) 1 x Per Day/30 Days ary Discharge Instructions: Apply directly to wound bed as directed Secondary Dressing: Zetuvit Plus Silicone Border Dressing 4x4 (in/in) 1 x Per Day/30 Days Discharge Instructions: Apply silicone border over primary dressing as directed. 1. At this point we are continuing with the Dakin's wet-to-dry dressing to the ischial tuberosity new none #2 Hydrofera Blue not the Santyl to the areas on his back. Polymen would be an alternative in this area. 2. I do not believe we could consider Mark wound VAC for either one of the ischial tuberosities at this point because the proximity to his scrotum is just to close 3. No evidence of infection in either wound 4. Once again I emphasized the amount of time that he is up in his wheelchair on the ischial tuberosity wounds has to be no more than 2 hours at Mark time Electronic Signature(s) Signed: 08/12/2023 5:00:10 PM By: Baltazar Najjar MD Entered By: Baltazar Najjar on 08/12/2023  11:49:21 -------------------------------------------------------------------------------- SuperBill Details Patient Name: Date of Service: [name], [name]. 08/12/2023 Medical Record Number: [id number] Patient Account Number: [id number] Date of Birth/Sex: Treating RN: 08/31/1961 (62 y.o. [name]) Primary Care Provider: [facility], [name] Other Clinician: Referring Provider: Treating Provider/Extender: [facility] [place] in Treatment: 12 Diagnosis Coding ICD-10 Codes Code Description L89.314 Pressure ulcer of right buttock, stage 4 L89.324 Pressure ulcer of left buttock, stage 4 L89.312 Pressure ulcer of right buttock, stage 2 L89.103 Pressure ulcer of unspecified part of back, stage 3 L89.102 Pressure ulcer of unspecified part of back, stage 2 E44.0 Moderate protein-calorie malnutrition G35 Multiple sclerosis N31.9 Neuromuscular dysfunction of bladder, unspecified Facility Procedures Electronic Signature(s) Signed: 08/12/2023 5:00:10 PM By: Baltazar Najjar MD Signed: 08/13/2023 12:02:50 PM By: Karie Schwalbe RN Entered By: Karie Schwalbe on 08/12/2023 14:07:15

## 2023-08-26 ENCOUNTER — Telehealth: Payer: Self-pay | Admitting: Neurology

## 2023-08-26 ENCOUNTER — Ambulatory Visit (HOSPITAL_BASED_OUTPATIENT_CLINIC_OR_DEPARTMENT_OTHER): Payer: No Typology Code available for payment source | Admitting: Internal Medicine

## 2023-08-26 NOTE — Telephone Encounter (Signed)
Dr Maurice March @ [facility]  is an Infectious disease specialist and see's this pt for his UTI's .  He is wanting to discuss pt's UTI's and MS aspect of it, his cell # is [phone] please call .

## 2023-09-13 ENCOUNTER — Encounter (HOSPITAL_BASED_OUTPATIENT_CLINIC_OR_DEPARTMENT_OTHER): Payer: No Typology Code available for payment source | Attending: Internal Medicine | Admitting: Internal Medicine

## 2023-09-13 DIAGNOSIS — G35 Multiple sclerosis: Secondary | ICD-10-CM | POA: Insufficient documentation

## 2023-09-13 DIAGNOSIS — L89102 Pressure ulcer of unspecified part of back, stage 2: Secondary | ICD-10-CM | POA: Diagnosis present

## 2023-09-13 DIAGNOSIS — L89312 Pressure ulcer of right buttock, stage 2: Secondary | ICD-10-CM | POA: Diagnosis not present

## 2023-09-13 DIAGNOSIS — N319 Neuromuscular dysfunction of bladder, unspecified: Secondary | ICD-10-CM | POA: Insufficient documentation

## 2023-09-13 DIAGNOSIS — L89103 Pressure ulcer of unspecified part of back, stage 3: Secondary | ICD-10-CM | POA: Diagnosis not present

## 2023-09-13 DIAGNOSIS — E440 Moderate protein-calorie malnutrition: Secondary | ICD-10-CM | POA: Diagnosis not present

## 2023-09-13 DIAGNOSIS — J45909 Unspecified asthma, uncomplicated: Secondary | ICD-10-CM | POA: Insufficient documentation

## 2023-09-13 DIAGNOSIS — L89324 Pressure ulcer of left buttock, stage 4: Secondary | ICD-10-CM | POA: Insufficient documentation

## 2023-09-13 DIAGNOSIS — Z87440 Personal history of urinary (tract) infections: Secondary | ICD-10-CM | POA: Insufficient documentation

## 2023-09-13 DIAGNOSIS — L89314 Pressure ulcer of right buttock, stage 4: Secondary | ICD-10-CM | POA: Diagnosis not present

## 2023-09-13 NOTE — Progress Notes (Addendum)
[name] ([id number]) [id number] Page 1 of 14 Visit Report for 09/13/2023 Arrival Information Details Patient Name: Date of Service: [name]. 09/13/2023 10:30 Mark M Medical Record Number: [id number] Patient Account Number: [id number] Date of Birth/Sex: Treating RN: 12-06-1960 (62 y.o. [name] Primary Care Kyuss Hale: [facility] Other Clinician: Referring Jori Thrall: Treating Adisen Bennion/Extender: [facility], [place] in Treatment: 16 Visit Information History Since Last Visit Added or deleted any medications: No Patient Arrived: Wheel Chair Any new allergies or adverse reactions: No Arrival Time: 10:54 Had Mark fall or experienced change in No Accompanied By: spouse activities of daily living that may affect Transfer Assistance: Michiel Sites Lift risk of falls: Patient Identification Verified: Yes Signs or symptoms of abuse/neglect since last visito No Patient Requires Transmission-Based Precautions: No Hospitalized since last visit: No Patient Has Alerts: No Implantable device outside of the clinic excluding No cellular tissue based products placed in the center since last visit: Has Dressing in Place as Prescribed: Yes Pain Present Now: No Electronic Signature(s) Signed: 09/13/2023 12:28:15 PM By: Karie Schwalbe RN Entered By: Karie Schwalbe on 09/13/2023 08:33:29 -------------------------------------------------------------------------------- Clinic Level of Care Assessment Details Patient Name: Date of Service: [name], [name]. 09/13/2023 10:30 Mark M Medical Record Number: [id number] Patient Account Number: [id number] Date of Birth/Sex: Treating RN: 1960/10/20 (62 y.o. [name] Primary Care Evola Hollis: [facility] Other Clinician: Referring Leonardo Plaia: Treating Aysia Lowder/Extender: [facility], [place] in Treatment: 16 Clinic Level of Care Assessment Items TOOL 4  Quantity Score X- 1 0 Use when only an EandM is performed on FOLLOW-UP visit ASSESSMENTS - Nursing Assessment / Reassessment X- 1 10 Reassessment of Co-morbidities (includes updates in patient status) X- 1 5 Reassessment of Adherence to Treatment Plan ASSESSMENTS - Wound and Skin Mark ssessment / Reassessment []  - 0 Simple Wound Assessment / Reassessment - one wound X- 4 5 Complex Wound Assessment / Reassessment - multiple wounds []  - 0 Dermatologic / Skin Assessment (not related to wound area) ASSESSMENTS - Focused Assessment []  - 0 Circumferential Edema Measurements - multi extremities []  - 0 Nutritional Assessment / Counseling / Intervention [name] ([id number]) [id number] Page 2 of 14 []  - 0 Lower Extremity Assessment (monofilament, tuning fork, pulses) []  - 0 Peripheral Arterial Disease Assessment (using hand held doppler) ASSESSMENTS - Ostomy and/or Continence Assessment and Care []  - 0 Incontinence Assessment and Management []  - 0 Ostomy Care Assessment and Management (repouching, etc.) PROCESS - Coordination of Care []  - 0 Simple Patient / Family Education for ongoing care X- 1 20 Complex (extensive) Patient / Family Education for ongoing care X- 1 10 Staff obtains Chiropractor, Records, T Results / Process Orders est X- 1 10 Staff telephones HHA, Nursing Homes / Clarify orders / etc []  - 0 Routine Transfer to another Facility (non-emergent condition) []  - 0 Routine Hospital Admission (non-emergent condition) []  - 0 New Admissions / Manufacturing engineer / Ordering NPWT Apligraf, etc. , []  - 0 Emergency Hospital Admission (emergent condition) X- 1 10 Simple Discharge Coordination []  - 0 Complex (extensive) Discharge Coordination PROCESS - Special Needs []  - 0 Pediatric / Minor Patient Management []  - 0 Isolation Patient Management []  - 0 Hearing / Language / Visual special needs []  - 0 Assessment of Community  assistance (transportation, D/C planning, etc.) []  - 0 Additional assistance / Altered mentation []  - 0 Support Surface(s) Assessment (bed, cushion, seat, etc.) INTERVENTIONS - Wound Cleansing / Measurement []  - 0  Simple Wound Cleansing - one wound X- 4 5 Complex Wound Cleansing - multiple wounds X- 1 5 Wound Imaging (photographs - any number of wounds) []  - 0 Wound Tracing (instead of photographs) []  - 0 Simple Wound Measurement - one wound X- 4 5 Complex Wound Measurement - multiple wounds INTERVENTIONS - Wound Dressings []  - 0 Small Wound Dressing one or multiple wounds X- 3 15 Medium Wound Dressing one or multiple wounds []  - 0 Large Wound Dressing one or multiple wounds []  - 0 Application of Medications - topical []  - 0 Application of Medications - injection INTERVENTIONS - Miscellaneous []  - 0 External ear exam []  - 0 Specimen Collection (cultures, biopsies, blood, body fluids, etc.) []  - 0 Specimen(s) / Culture(s) sent or taken to Lab for analysis []  - 0 Patient Transfer (multiple staff / Nurse, adult / Similar devices) []  - 0 Simple Staple / Suture removal (25 or less) []  - 0 Complex Staple / Suture removal (26 or more) []  - 0 Hypo / Hyperglycemic Management (close monitor of Blood Glucose) [name], [name] ([id number]) [id number] Page 3 of 14 []  - 0 Ankle / Brachial Index (ABI) - do not check if billed separately X- 1 5 Vital Signs Has the patient been seen at the hospital within the last three years: Yes Total Score: 180 Level Of Care: New/Established - Level 5 Electronic Signature(s) Signed: 09/13/2023 12:28:15 PM By: Karie Schwalbe RN Entered By: Karie Schwalbe on 09/13/2023 09:26:08 -------------------------------------------------------------------------------- Complex / Palliative Patient Assessment Details Patient Name: Date of Service: [name], [name]. 09/13/2023 10:30 Mark M Medical Record Number:  [id number] Patient Account Number: [id number] Date of Birth/Sex: Treating RN: 11/11/1960 (62 y.o. Tammy Sours Primary Care Antonyo Hinderer: [facility] Other Clinician: Referring Jaiden Wahab: Treating Aishani Kalis/Extender: Kathaleen Grinder in Treatment: 16 Complex Wound Management Criteria Patient has remarkable or complex co-morbidities requiring medications or treatments that extend wound healing times. Examples: Diabetes mellitus with chronic renal failure or end stage renal disease requiring dialysis Advanced or poorly controlled rheumatoid arthritis Diabetes mellitus and end stage chronic obstructive pulmonary disease Active cancer with current chemo- or radiation therapy MS, Dysphagia, asthma, trigeminal neuroalgia, depression, asthma, stage 4 pressure ulcers Palliative Wound Management Criteria Care Approach Wound Care Plan: Complex Wound Management Electronic Signature(s) Signed: 09/28/2023 2:23:06 PM By: Shawn Stall RN, BSN Signed: 09/28/2023 4:32:13 PM By: Baltazar Najjar MD Previous Signature: 09/28/2023 12:13:02 PM Version By: Shawn Stall RN, BSN Entered By: Shawn Stall on 09/28/2023 11:23:05 -------------------------------------------------------------------------------- Encounter Discharge Information Details Patient Name: Date of Service: [name], [name]. 09/13/2023 10:30 Mark M Medical Record Number: [id number] Patient Account Number: [id number] Date of Birth/Sex: Treating RN: 07-16-1961 (62 y.o. [name] Primary Care Hyder Deman: [facility] Other Clinician: Referring Bettyann Birchler: Treating Iden Stripling/Extender: Kathaleen Grinder in Treatment: 16 Encounter Discharge Information Items Discharge Condition: Stable Ambulatory Status: Wheelchair Discharge Destination: Home Transportation: Private Auto Accompanied By: spouse Schedule Follow-up Appointment: Yes Clinical Summary of Care: Patient  Declined Electronic Signature(s) [place], Shaheem [name] ([id number]) [id number] Page 4 of 14 Signed: 09/13/2023 12:28:15 PM By: Karie Schwalbe RN Entered By: Karie Schwalbe on 09/13/2023 09:26:53 -------------------------------------------------------------------------------- Lower Extremity Assessment Details Patient Name: Date of Service: [name]. 09/13/2023 10:30 Mark M Medical Record Number: [id number] Patient Account Number: [id number] Date of Birth/Sex: Treating RN: 05/21/1961 (63 y.o. [name] Primary Care Riley Papin: [facility] Other Clinician: Referring Desaree Downen: Treating Kamaria Lucia/Extender: Leota Jacobsen  Weeks in Treatment: 16 Electronic Signature(s) Signed: 09/13/2023 12:28:15 PM By: Karie Schwalbe RN Entered By: Karie Schwalbe on 09/13/2023 08:33:54 -------------------------------------------------------------------------------- Multi Wound Chart Details Patient Name: Date of Service: [name], [name]. 09/13/2023 10:30 Mark M Medical Record Number: [id number] Patient Account Number: [id number] Date of Birth/Sex: Treating RN: July 22, 1961 (62 y.o. M) Primary Care Reace Breshears: [facility] Other Clinician: Referring Clarece Drzewiecki: Treating Kentrell Guettler/Extender: [facility] [place] in Treatment: 16 Vital Signs Height(in): 70 Pulse(bpm): 59 Weight(lbs): 176 Blood Pressure(mmHg): 110/73 Body Mass Index(BMI): 25.3 Temperature([name]): 97.9 Respiratory Rate(breaths/min): 18 [2:Photos:] Medial Back Left Gluteus Right Gluteal fold Wound Location: Gradually Appeared Gradually Appeared Pressure Injury Wounding Event: Pressure Ulcer Pressure Ulcer Pressure Ulcer Primary Etiology: Asthma Asthma Asthma Comorbid History: 05/14/2022 05/14/2022 05/14/2022 Date Acquired: 16 16 16  Weeks of Treatment: Open Open Open Wound Status: No No No Wound Recurrence: 1.3x0.5x0.1 4x1.5x1.5  3.8x5.4x1.4 Measurements L x W x D (cm) 0.511 4.712 16.116 Mark (cm) : rea 0.051 7.069 22.563 Volume (cm) : 76.30% 63.60% 30.50% % Reduction in Mark rea: 88.20% 84.40% 72.20% % Reduction in Volume: 1 Position 1 (o'clock): 4.2 Maximum Distance 1 (cm): 2 9 Starting Position 1 (o'clock): 10 3 Ending Position 1 (o'clock): [name], [name] ([id number]) [id number] Page 5 of 14 4.5 2.5 Maximum Distance 1 (cm): No Yes No Tunneling: No Yes Yes Undermining: Category/Stage III Category/Stage IV Category/Stage IV Classification: Medium Medium Medium Exudate Mark mount: Serosanguineous Serosanguineous Serous Exudate Type: red, brown red, brown amber Exudate Color: Distinct, outline attached Distinct, outline attached Distinct, outline attached Wound Margin: Small (1-33%) Medium (34-66%) Medium (34-66%) Granulation Mark mount: Red Red, Pink Red, Pink Granulation Quality: Large (67-100%) Medium (34-66%) Small (1-33%) Necrotic Mark mount: Fat Layer (Subcutaneous Tissue): Yes Fat Layer (Subcutaneous Tissue): Yes Fat Layer (Subcutaneous Tissue): Yes Exposed Structures: Fascia: No Muscle: Yes Muscle: Yes Tendon: No Bone: Yes Fascia: No Muscle: No Tendon: No Joint: No Joint: No Bone: No Bone: No Medium (34-66%) N/Mark Small (1-33%) Epithelialization: Scarring: Yes Scarring: Yes No Abnormalities Noted Periwound Skin Texture: Excoriation: No Induration: No Callus: No Crepitus: No Rash: No Maceration: No Maceration: No No Abnormalities Noted Periwound Skin Moisture: Dry/Scaly: No Dry/Scaly: No Atrophie Blanche: No Hemosiderin Staining: Yes No Abnormalities Noted Periwound Skin Color: Cyanosis: No Ecchymosis: No Erythema: No Hemosiderin Staining: No Mottled: No Pallor: No Rubor: No No Abnormality No Abnormality N/Mark Temperature: Wound Number: 5 N/Mark N/Mark Photos: N/Mark N/Mark Distal, Midline Back N/Mark N/Mark Wound Location: Pressure Injury N/Mark N/Mark Wounding  Event: Pressure Ulcer N/Mark N/Mark Primary Etiology: Asthma N/Mark N/Mark Comorbid History: 05/13/2023 N/Mark N/Mark Date Acquired: 15 N/Mark N/Mark Weeks of Treatment: Healed - Epithelialized N/Mark N/Mark Wound Status: No N/Mark N/Mark Wound Recurrence: 0x0x0 N/Mark N/Mark Measurements L x W x D (cm) 0 N/Mark N/Mark Mark (cm) : rea 0 N/Mark N/Mark Volume (cm) : 100.00% N/Mark N/Mark % Reduction in Mark rea: 100.00% N/Mark N/Mark % Reduction in Volume: No N/Mark N/Mark Tunneling: No N/Mark N/Mark Undermining: Category/Stage II N/Mark N/Mark Classification: None Present N/Mark N/Mark Exudate Mark mount: N/Mark N/Mark N/Mark Exudate Type: N/Mark N/Mark N/Mark Exudate Color: Distinct, outline attached N/Mark N/Mark Wound Margin: None Present (0%) N/Mark N/Mark Granulation Mark mount: N/Mark N/Mark N/Mark Granulation Quality: None Present (0%) N/Mark N/Mark Necrotic Mark mount: Fascia: No N/Mark N/Mark Exposed Structures: Fat Layer (Subcutaneous Tissue): No Tendon: No Muscle: No Joint: No Bone: No Large (67-100%) N/Mark N/Mark Epithelialization: Excoriation: No N/Mark N/Mark Periwound Skin Texture: Induration: No Callus: No Crepitus: No Rash: No Scarring:  No Maceration: No N/Mark N/Mark Periwound Skin Moisture: Dry/Scaly: No Atrophie Blanche: No N/Mark N/Mark Periwound Skin Color: Cyanosis: No [name], [name] ([id number]) [id number] Page 6 of 14 Ecchymosis: No Erythema: No Hemosiderin Staining: No Mottled: No Pallor: No Rubor: No No Abnormality N/Mark N/Mark Temperature: Treatment Notes Wound #2 (Back) Wound Laterality: Medial Cleanser Soap and Water Discharge Instruction: May shower and wash wound with dial antibacterial soap and water prior to dressing change. Vashe 5.8 (oz) Discharge Instruction: or Cleanse the wound with Vashe prior to applying Mark clean dressing using gauze sponges, not tissue or cotton balls. Peri-Wound Care Topical Primary Dressing Hydrofera Blue Ready Transfer Foam, 2.5x2.5 (in/in) Discharge Instruction: Apply directly to wound bed as directed Secondary  Dressing Zetuvit Plus Silicone Border Dressing 4x4 (in/in) Discharge Instruction: Apply silicone border over primary dressing as directed. Secured With Compression Wrap Compression Stockings Add-Ons Wound #3 (Gluteus) Wound Laterality: Left Cleanser Vashe 5.8 (oz) Discharge Instruction: Cleanse the wound with Vashe prior to applying Mark clean dressing using gauze sponges, not tissue or cotton balls. Peri-Wound Care Topical Primary Dressing Dakin's Solution 0.25%, 16 (oz) Discharge Instruction: Moisten gauze with Dakin's solution Secondary Dressing Zetuvit Plus Silicone Border Dressing 7x7(in/in) Discharge Instruction: Apply silicone border over primary dressing as directed. Secured With Compression Wrap Compression Stockings Add-Ons Wound #4 (Gluteal fold) Wound Laterality: Right Cleanser Vashe 5.8 (oz) Discharge Instruction: Cleanse the wound with Vashe prior to applying Mark clean dressing using gauze sponges, not tissue or cotton balls. Peri-Wound Care Topical Primary Dressing Dakin's Solution 0.25%, 16 (oz) Discharge Instruction: Moisten gauze with Dakin's solution Secondary Dressing [name], [name] ([id number]) [id number] Page 7 of 14 Zetuvit Plus Silicone Border Dressing 7x7(in/in) Discharge Instruction: Apply silicone border over primary dressing as directed. Secured With Compression Wrap Compression Stockings Facilities manager) Signed: 09/13/2023 5:24:09 PM By: Baltazar Najjar MD Entered By: Baltazar Najjar on 09/13/2023 09:31:10 -------------------------------------------------------------------------------- Multi-Disciplinary Care Plan Details Patient Name: Date of Service: [name], [name]. 09/13/2023 10:30 Mark M Medical Record Number: [id number] Patient Account Number: [id number] Date of Birth/Sex: Treating RN: 11-14-1960 (62 y.o. [name] Primary Care Doniel Maiello: [facility] Other Clinician: Referring  Kaikoa Magro: Treating Keyly Baldonado/Extender: [facility] [place] in Treatment: 16 Active Inactive Wound/Skin Impairment Nursing Diagnoses: Knowledge deficit related to smoking impact on wound healing Goals: Patient/caregiver will verbalize understanding of skin care regimen Date Initiated: 05/18/2023 Target Resolution Date: 11/12/2023 Goal Status: Active Interventions: Assess patient/caregiver ability to obtain necessary supplies Assess patient/caregiver ability to perform ulcer/skin care regimen upon admission and as needed Assess ulceration(s) every visit Provide education on ulcer and skin care Screen for HBO Treatment Activities: Skin care regimen initiated : 05/18/2023 Topical wound management initiated : 05/18/2023 Notes: Electronic Signature(s) Signed: 09/13/2023 12:28:15 PM By: Karie Schwalbe RN Entered By: Karie Schwalbe on 09/13/2023 09:24:55 -------------------------------------------------------------------------------- Pain Assessment Details Patient Name: Date of Service: [name], [name]. 09/13/2023 10:30 Mark M Medical Record Number: [id number] Patient Account Number: [id number] [name], [name] ([id number]) [id number] Page 8 of 14 Date of Birth/Sex: Treating RN: May 24, 1961 (62 y.o. [name] Primary Care Edelmira Gallogly: Other Clinician: [facility] Referring Zully Frane: Treating Josceline Chenard/Extender: Kathaleen Grinder in Treatment: 16 Active Problems Location of Pain Severity and Description of Pain Patient Has Paino No Site Locations Pain Management and Medication Current Pain Management: Electronic Signature(s) Signed: 09/13/2023 12:28:15 PM By: Karie Schwalbe RN Entered By: Karie Schwalbe on 09/13/2023 08:33:44 -------------------------------------------------------------------------------- Patient/Caregiver Education Details Patient Name: Date of Service: [name]  12/2/2024andnbsp10:30 Mark M Medical Record Number: [id number] Patient Account Number: [id number] Date of Birth/Gender: Treating RN: Jan 09, 1961 (62 y.o. [name] Primary Care Physician: [facility] Other Clinician: Referring Physician: Treating Physician/Extender: Kathaleen Grinder in Treatment: 16 Education Assessment Education Provided To: Patient Education Topics Provided Wound/Skin Impairment: Methods: Explain/Verbal Responses: State content correctly [employer]) Signed: 09/13/2023 12:28:15 PM By: Karie Schwalbe RN Entered By: Karie Schwalbe on 09/13/2023 09:25:08 [name], Gildo [name] ([id number]) [id number] Page 9 of 14 -------------------------------------------------------------------------------- Wound Assessment Details Patient Name: Date of Service: [name]. 09/13/2023 10:30 Mark M Medical Record Number: [id number] Patient Account Number: [id number] Date of Birth/Sex: Treating RN: Sep 15, 1961 (62 y.o. [name] Primary Care Benett Swoyer: [facility] Other Clinician: Referring Kesha Hurrell: Treating Dshaun Reppucci/Extender: [facility] [place] in Treatment: 16 Wound Status Wound Number: 2 Primary Etiology: Pressure Ulcer Wound Location: Medial Back Wound Status: Open Wounding Event: Gradually Appeared Comorbid History: Asthma Date Acquired: 05/14/2022 Weeks Of Treatment: 16 Clustered Wound: No Photos Wound Measurements Length: (cm) 1 Width: (cm) 0 Depth: (cm) 0 Area: (cm) Volume: (cm) .3 % Reduction in Area: 76.3% .5 % Reduction in Volume: 88.2% .1 Epithelialization: Medium (34-66%) 0.511 Tunneling: No 0.051 Undermining: No Wound Description Classification: Category/Stage III Wound Margin: Distinct, outline attached Exudate Amount: Medium Exudate Type: Serosanguineous Exudate Color: red, brown Foul Odor After Cleansing: No Slough/Fibrino  Yes Wound Bed Granulation Amount: Small (1-33%) Exposed Structure Granulation Quality: Red Fascia Exposed: No Necrotic Amount: Large (67-100%) Fat Layer (Subcutaneous Tissue) Exposed: Yes Necrotic Quality: Adherent Slough Tendon Exposed: No Muscle Exposed: No Joint Exposed: No Bone Exposed: No Periwound Skin Texture Texture Color No Abnormalities Noted: No No Abnormalities Noted: No Callus: No Atrophie Blanche: No Crepitus: No Cyanosis: No Excoriation: No Ecchymosis: No Induration: No Erythema: No Rash: No Hemosiderin Staining: No Scarring: Yes Mottled: No Pallor: No Moisture Rubor: No No Abnormalities Noted: No Dry / Scaly: No Temperature / Pain Maceration: No Temperature: No Abnormality [name], [name] ([id number]) [id number] Page 10 of 14 Treatment Notes Wound #2 (Back) Wound Laterality: Medial Cleanser Soap and Water Discharge Instruction: May shower and wash wound with dial antibacterial soap and water prior to dressing change. Vashe 5.8 (oz) Discharge Instruction: or Cleanse the wound with Vashe prior to applying Mark clean dressing using gauze sponges, not tissue or cotton balls. Peri-Wound Care Topical Primary Dressing Hydrofera Blue Ready Transfer Foam, 2.5x2.5 (in/in) Discharge Instruction: Apply directly to wound bed as directed Secondary Dressing Zetuvit Plus Silicone Border Dressing 4x4 (in/in) Discharge Instruction: Apply silicone border over primary dressing as directed. Secured With Compression Wrap Compression Stockings Facilities manager) Signed: 09/13/2023 12:28:15 PM By: Karie Schwalbe RN Entered By: Karie Schwalbe on 09/13/2023 08:42:53 -------------------------------------------------------------------------------- Wound Assessment Details Patient Name: Date of Service: [name], [name]. 09/13/2023 10:30 Mark M Medical Record Number: [id number] Patient Account Number: [id number] Date of Birth/Sex:  Treating RN: 06/13/1961 (62 y.o. [name] Primary Care Tayvin Preslar: Clinic, [place] Other Clinician: Referring Cassara Nida: Treating Deandrea Rion/Extender: [facility] [place] in Treatment: 16 Wound Status Wound Number: 3 Primary Etiology: Pressure Ulcer Wound Location: Left Gluteus Wound Status: Open Wounding Event: Gradually Appeared Comorbid History: Asthma Date Acquired: 05/14/2022 Weeks Of Treatment: 16 Clustered Wound: No Photos Wound Measurements Length: (cm) 4 [name], [name] ([id number]) Width: (cm) 1.5 Depth: (cm) 1.5 Area: (cm) 4.712 Volume: (cm) 7.069 % Reduction in Area: 63.6% [id number] Page 11 of 14 %  Reduction in Volume: 84.4% Tunneling: Yes Position (o'clock): 1 Maximum Distance: (cm) 4.2 Undermining: Yes Starting Position (o'clock): 2 Ending Position (o'clock): 10 Maximum Distance: (cm) 4.5 Wound Description Classification: Category/Stage IV Wound Margin: Distinct, outline attached Exudate Amount: Medium Exudate Type: Serosanguineous Exudate Color: red, brown Foul Odor After Cleansing: No Slough/Fibrino No Wound Bed Granulation Amount: Medium (34-66%) Exposed Structure Granulation Quality: Red, Pink Fat Layer (Subcutaneous Tissue) Exposed: Yes Necrotic Amount: Medium (34-66%) Muscle Exposed: Yes Necrotic Quality: Adherent Slough Necrosis of Muscle: No Bone Exposed: Yes Periwound Skin Texture Texture Color No Abnormalities Noted: No No Abnormalities Noted: No Scarring: Yes Hemosiderin Staining: Yes Moisture Temperature / Pain No Abnormalities Noted: No Temperature: No Abnormality Dry / Scaly: No Maceration: No Electronic Signature(s) Signed: 09/13/2023 12:28:15 PM By: Karie Schwalbe RN Entered By: Karie Schwalbe on 09/13/2023 08:19:28 -------------------------------------------------------------------------------- Wound Assessment Details Patient Name: Date of Service: [name], [name]. 09/13/2023 10:30 Mark M Medical Record Number: [id number] Patient Account Number: [id number] Date of Birth/Sex: Treating RN: 01/25/1961 (62 y.o. [name] Primary Care Pennelope Basque: [facility] Other Clinician: Referring Ellee Wawrzyniak: Treating Waris Rodger/Extender: [facility] [place] in Treatment: 16 Wound Status Wound Number: 4 Primary Etiology: Pressure Ulcer Wound Location: Right Gluteal fold Wound Status: Open Wounding Event: Pressure Injury Comorbid History: Asthma Date Acquired: 05/14/2022 Weeks Of Treatment: 16 Clustered Wound: No Photos [name], [name] ([id number]) [id number] Page 12 of 14 Wound Measurements Length: (cm) 3.8 Width: (cm) 5.4 Depth: (cm) 1.4 Area: (cm) 16.116 Volume: (cm) 22.563 % Reduction in Area: 30.5% % Reduction in Volume: 72.2% Epithelialization: Small (1-33%) Tunneling: No Undermining: Yes Starting Position (o'clock): 9 Ending Position (o'clock): 3 Maximum Distance: (cm) 2.5 Wound Description Classification: Category/Stage IV Wound Margin: Distinct, outline attached Exudate Amount: Medium Exudate Type: Serous Exudate Color: amber Foul Odor After Cleansing: No Slough/Fibrino No Wound Bed Granulation Amount: Medium (34-66%) Exposed Structure Granulation Quality: Red, Pink Fascia Exposed: No Necrotic Amount: Small (1-33%) Fat Layer (Subcutaneous Tissue) Exposed: Yes Necrotic Quality: Adherent Slough Tendon Exposed: No Muscle Exposed: Yes Necrosis of Muscle: No Joint Exposed: No Bone Exposed: No Periwound Skin Texture Texture Color No Abnormalities Noted: Yes No Abnormalities Noted: Yes Moisture No Abnormalities Noted: Yes Treatment Notes Wound #4 (Gluteal fold) Wound Laterality: Right Cleanser Vashe 5.8 (oz) Discharge Instruction: Cleanse the wound with Vashe prior to applying Mark clean dressing using gauze sponges, not tissue or cotton balls. Peri-Wound  Care Topical Primary Dressing Dakin's Solution 0.25%, 16 (oz) Discharge Instruction: Moisten gauze with Dakin's solution Secondary Dressing Zetuvit Plus Silicone Border Dressing 7x7(in/in) Discharge Instruction: Apply silicone border over primary dressing as directed. Secured With Compression Wrap Compression Stockings Add-Ons [name], [name] ([id number]) [id number] Page 13 of 14 Electronic Signature(s) Signed: 09/13/2023 12:28:15 PM By: Karie Schwalbe RN Entered By: Karie Schwalbe on 09/13/2023 08:42:41 -------------------------------------------------------------------------------- Wound Assessment Details Patient Name: Date of Service: [name], [name]. 09/13/2023 10:30 Mark M Medical Record Number: [id number] Patient Account Number: [id number] Date of Birth/Sex: Treating RN: 03/14/1961 (62 y.o. [name] Primary Care Kabrea Seeney: [facility] Other Clinician: Referring Treson Laura: Treating Joquan Lotz/Extender: [facility] [place] in Treatment: 16 Wound Status Wound Number: 5 Primary Etiology: Pressure Ulcer Wound Location: Distal, Midline Back Wound Status: Healed - Epithelialized Wounding Event: Pressure Injury Comorbid History: Asthma Date Acquired: 05/13/2023 Weeks Of Treatment: 15 Clustered Wound: No Photos Wound Measurements Length: (cm) Width: (cm) Depth: (cm) Area: (cm) Volume: (cm) 0 % Reduction in Area: 100% 0 % Reduction in  Volume: 100% 0 Epithelialization: Large (67-100%) 0 Tunneling: No 0 Undermining: No Wound Description Classification: Category/Stage II Wound Margin: Distinct, outline attached Exudate Amount: None Present Foul Odor After Cleansing: No Slough/Fibrino No Wound Bed Granulation Amount: None Present (0%) Exposed Structure Necrotic Amount: None Present (0%) Fascia Exposed: No Fat Layer (Subcutaneous Tissue) Exposed: No Tendon Exposed: No Muscle Exposed: No Joint  Exposed: No Bone Exposed: No Periwound Skin Texture Texture Color No Abnormalities Noted: No No Abnormalities Noted: No Callus: No Atrophie Blanche: No Crepitus: No Cyanosis: No Excoriation: No Ecchymosis: No Induration: No Erythema: No Rash: No Hemosiderin Staining: No Scarring: No Mottled: No Pallor: No Moisture Gentzler, Dellis [name] ([id number]) [id number] Page 14 of 14 Rubor: No No Abnormalities Noted: Yes Temperature / Pain Temperature: No Abnormality Electronic Signature(s) Signed: 09/13/2023 12:28:15 PM By: Karie Schwalbe RN Entered By: Karie Schwalbe on 09/13/2023 08:54:23 -------------------------------------------------------------------------------- Vitals Details Patient Name: Date of Service: [name], [name]. 09/13/2023 10:30 Mark M Medical Record Number: [id number] Patient Account Number: [id number] Date of Birth/Sex: Treating RN: [birth year] (62 y.o. [name] Primary Care Elad Macphail: [facility] Other Clinician: Referring Chardae Mulkern: Treating Massiel Stipp/Extender: [facility] [place] in Treatment: 16 Vital Signs Time Taken: 10:55 Temperature ([name]): 97.9 Height (in): 70 Pulse (bpm): 59 Weight (lbs): 176 Respiratory Rate (breaths/min): 18 Body Mass Index (BMI): 25.3 Blood Pressure (mmHg): 110/73 Reference Range: 80 - 120 mg / dl Electronic Signature(s) Signed: 09/13/2023 12:28:15 PM By: Karie Schwalbe RN Entered By: Karie Schwalbe on 09/13/2023 09:24:27

## 2023-09-14 NOTE — Progress Notes (Signed)
[name] ([id number]) [id number] Page 1 of 7 Visit Report for 09/13/2023 HPI Details Patient Name: Date of Service: [name]. 09/13/2023 10:30 A M Medical Record Number: [id number] Patient Account Number: [id number] Date of Birth/Sex: Treating RN: [birth year] (62 y.o. M) Primary Care Provider: [facility] Other Clinician: Referring Provider: Treating Provider/Extender: Kathaleen Grinder in Treatment: 16 History of Present Illness HPI Description: 05/18/2023 [name] is a 62 year old male with a past medical history of multiple sclerosis and neurogenic bladder with chronic indwelling Foley catheter that presents to the clinic for a 1 year history of nonhealing ulcers to the bilateral ischium As well as upper back. He has an air mattress. He spends about 50% of the time in the bed and 50% of the time in the wheelchair. He does not have a Roho cushion. He has been using Medihoney and Santyl to the wound beds. He currently denies systemic signs of infection. Of note he was hospitalized on 2 occasions in January 2024 for trigeminal neuralgia. He has lost a significant amount of weight due to this and his oral intake has declined. He was evaluated for PEG tube but was not a candidate. 8/19; patient presents for follow-up. Patient has home health. He has been using Dakin's wet-to-dry dressings to the ischial wounds and Santyl to the back wound. Patient was recently hospitalized for UTI and received IV Zosyn for 3 days followed by fosfomycin. He completed 6 days of antibiotics. He currently denies systemic signs of infection. He also had a portable pelvic x-ray that did not show any obvious erosive changes. Wife is presentAnd she states she is trying to aggressively offload the wound beds by repositioning him on his sides. Unfortunately he has developed two wounds 1 to the right buttocks and 1 to the midline  back. 9/5; patient presents for follow-up. Wife is present. She has been using Dakin's wet-to-dry dressings to the ischial wounds and Santyl to the back wounds and right buttocks wound. Overall wounds have improved in appearance and size since last clinic visit. 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 He is here with his wife. 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 His wife tells me she gets him up in the wheelchair at about 7:00 in the morning and he is there till 5:00 at night when they put him to bed. He is apparently eating and drinking well. We have been using Dakin's wet to dry did the stage IV wounds on his ischial tuberosities and Santyl with Hydrofera Blue to a small area on his back at roughly T4 10/3 patient with advanced multiple sclerosis. He has stage IV wounds over both ischial tuberosities and more superficial area on his back. He has been using Dakin's wet-to-dry on the ischial tuberosity wounds Santyl with Hydrofera Blue to the wound on his back 10/7; patient with advanced multiple sclerosis he has stage IV wounds over [name] at the ischial tuberosities more superficial areas on his spinal process. He still has an open area on the back. He has been using Dakin's wet-to-dry on the ischial tuberosity wounds Santyl with Hydrofera Blue to the wound on his back that still remains 10/31; advanced multiple sclerosis with bilateral stage IV wounds over the ischial tuberosities bilaterally. He has 2 areas on his back 1 was  a new reopening this time. Been using Hydrofera Blue and Santyl to the areas on his back and Dakin's wet-to-dry to the ischial tuberosity wounds. 12/2; stage IV wounds over both ischial tuberosities. There is still exposed bone but otherwise generally healthy granulation. No major changes in  wound dimensions. He has 2 areas on his back 1 has closed over distally the more proximal 1 appears better. Based on the length of time he is in a wheelchair I would view the ischial tuberosity wounds is palliative. [See my note of 07/01/2023]. He uses Dakin's wet to dry on these areas that have a large wound area. Electronic Signature(s) Signed: 09/13/2023 5:24:09 PM By: Baltazar Najjar MD Entered By: Baltazar Najjar on 09/13/2023 12:33:02 -------------------------------------------------------------------------------- Physical Exam Details Patient Name: Date of Service: [name], [name] 09/13/2023 10:30 A M Medical Record Number: [id number] Patient Account Number: [id number] Date of Birth/Sex: Treating RN: Dec 22, 1960 (62 y.o. M) Primary Care Provider: [facility] Other Clinician: Referring Provider: Treating Provider/Extender: Kathaleen Grinder in Treatment: [id number] Elisandro F ([id number]) [phone].pdf Page 2 of 7 Constitutional Sitting or standing Blood Pressure is within target range for patient.. Pulse regular and within target range for patient.Marland Kitchen Respirations regular, non-labored and within target range.. Temperature is normal and within the target range for the patient.Marland Kitchen Appears in no distress. Cardiovascular . Notes Wound exam; large open areas over both ischial tuberosities there is bone exposed in both of these but otherwise generally healthy granulation no other and surrounding soft tissue infection is suspected. The area on the left has a tunnel at 4:00. In the upper back the more distal wound is fully epithelialized superior to this the wound is still open but appears smaller Electronic Signature(s) Signed: 09/13/2023 5:24:09 PM By: Baltazar Najjar MD Entered By: Baltazar Najjar on 09/13/2023 12:36:42 -------------------------------------------------------------------------------- Physician Orders  Details Patient Name: Date of Service: [name], [name] 09/13/2023 10:30 A M Medical Record Number: [id number] Patient Account Number: [id number] Date of Birth/Sex: Treating RN: [birth year] (62 y.o. [name] Primary Care Provider: [facility] Other Clinician: Referring Provider: Treating Provider/Extender: Kathaleen Grinder in Treatment: 16 Verbal / Phone Orders: No Diagnosis Coding Follow-up Appointments Return appointment in 1 month. - ****HOYER ***extra time 60 minutes Dr. Leanord Hawking Please ask front desk for appointment Anesthetic (In clinic) Topical Lidocaine 4% applied to wound bed Bathing/ Shower/ Hygiene Other Bathing/Shower/Hygiene Orders/Instructions: - May bathe with soap and water Off-Loading Turn and reposition every 2 hours Other: - only an hour up to eat and back to bed. Turn every 2 hours. Best to stay in bed turn side to side and back one postition for max of 2 hrs Home Health New wound care orders this week; continue Home Health for wound care. May utilize formulary equivalent dressing for wound treatment orders unless otherwise specified. - Dakin's solution wet to dry to wounds - Left Gluteus and Right Gluteal Fold and Hydrafera Blue Ready bordered foam to medial back wound (x1) Other Home Health Orders/Instructions: - Please see the patient at least once a week OR please see the patient's spouse regarding wound supplies ( to order supplies for the patient) [facility] Wound Treatment Wound #2 - Back Wound Laterality: Medial Cleanser: Soap and Water 1 x Per Day/30 Days Discharge Instructions: May shower and wash wound with dial antibacterial soap and water prior to dressing change. Cleanser: Vashe 5.8 (oz) 1 x Per Day/30 Days Discharge Instructions: or Cleanse the  wound with Vashe prior to applying a clean dressing using gauze sponges, not tissue or cotton balls. Prim Dressing: Hydrofera Blue Ready Transfer Foam,  2.5x2.5 (in/in) 1 x Per Day/30 Days ary Discharge Instructions: Apply directly to wound bed as directed Secondary Dressing: Zetuvit Plus Silicone Border Dressing 4x4 (in/in) 1 x Per Day/30 Days Discharge Instructions: Apply silicone border over primary dressing as directed. Wound #3 - Gluteus Wound Laterality: Left Cleanser: Vashe 5.8 (oz) (Home Health) (Generic) 1 x Per Day/30 Days Discharge Instructions: Cleanse the wound with Vashe prior to applying a clean dressing using gauze sponges, not tissue or cotton balls. [name] ([id number]) [id number] Page 3 of 7 Prim Dressing: Dakin's Solution 0.25%, 16 (oz) (Home Health) 1 x Per Day/30 Days ary Discharge Instructions: Moisten gauze with Dakin's solution Secondary Dressing: Zetuvit Plus Silicone Border Dressing 7x7(in/in) (Home Health) 1 x Per Day/30 Days Discharge Instructions: Apply silicone border over primary dressing as directed. Wound #4 - Gluteal fold Wound Laterality: Right Cleanser: Vashe 5.8 (oz) (Home Health) (Generic) 1 x Per Day/30 Days Discharge Instructions: Cleanse the wound with Vashe prior to applying a clean dressing using gauze sponges, not tissue or cotton balls. Prim Dressing: Dakin's Solution 0.25%, 16 (oz) (Home Health) 1 x Per Day/30 Days ary Discharge Instructions: Moisten gauze with Dakin's solution Secondary Dressing: Zetuvit Plus Silicone Border Dressing 7x7(in/in) (Home Health) 1 x Per Day/30 Days Discharge Instructions: Apply silicone border over primary dressing as directed. Electronic Signature(s) Signed: 09/13/2023 12:28:15 PM By: Karie Schwalbe RN Signed: 09/13/2023 5:24:09 PM By: Baltazar Najjar MD Entered By: Karie Schwalbe on 09/13/2023 11:57:19 -------------------------------------------------------------------------------- Problem List Details Patient Name: Date of Service: [name], [name] 09/13/2023 10:30 A M Medical Record Number: [id number] Patient  Account Number: [id number] Date of Birth/Sex: Treating RN: Apr 24, 1961 (62 y.o. M) Primary Care Provider: [facility] Other Clinician: Referring Provider: Treating Provider/Extender: Baltazar Najjar [facility] [place] in Treatment: 16 Active Problems ICD-10 Encounter Code Description Active Date MDM Diagnosis L89.314 Pressure ulcer of right buttock, stage 4 05/18/2023 No Yes L89.324 Pressure ulcer of left buttock, stage 4 05/18/2023 No Yes L89.312 Pressure ulcer of right buttock, stage 2 05/31/2023 No Yes L89.103 Pressure ulcer of unspecified part of back, stage 3 05/18/2023 No Yes L89.102 Pressure ulcer of unspecified part of back, stage 2 05/31/2023 No Yes E44.0 Moderate protein-calorie malnutrition 05/18/2023 No Yes G35 Multiple sclerosis 05/18/2023 No Yes N31.9 Neuromuscular dysfunction of bladder, unspecified 05/18/2023 No Yes Mapp, Donevan F ([id number]) [id number] Page 4 of 7 Inactive Problems Resolved Problems Electronic Signature(s) Signed: 09/13/2023 5:24:09 PM By: Baltazar Najjar MD Entered By: Baltazar Najjar on 09/13/2023 12:31:02 -------------------------------------------------------------------------------- Progress Note Details Patient Name: Date of Service: [name], [name] 09/13/2023 10:30 A M Medical Record Number: [id number] Patient Account Number: [id number] Date of Birth/Sex: Treating RN: 1960-11-02 (62 y.o. M) Primary Care Provider: [facility] Other Clinician: Referring Provider: Treating Provider/Extender: Kathaleen Grinder in Treatment: 16 Subjective History of Present Illness (HPI) 05/18/2023 Mr. Desire Pedder is a 62 year old male with a past medical history of multiple sclerosis and neurogenic bladder with chronic indwelling Foley catheter that presents to the clinic for a 1 year history of nonhealing ulcers to the bilateral ischium As well as upper back. He has an air mattress. He  spends about 50% of the time in the bed and 50% of the time in the wheelchair. He does not have a Roho cushion. He has been using Medihoney and Santyl to  the wound beds. He currently denies systemic signs of infection. Of note he was hospitalized on 2 occasions in January 2024 for trigeminal neuralgia. He has lost a significant amount of weight due to this and his oral intake has declined. He was evaluated for PEG tube but was not a candidate. 8/19; patient presents for follow-up. Patient has home health. He has been using Dakin's wet-to-dry dressings to the ischial wounds and Santyl to the back wound. Patient was recently hospitalized for UTI and received IV Zosyn for 3 days followed by fosfomycin. He completed 6 days of antibiotics. He currently denies systemic signs of infection. He also had a portable pelvic x-ray that did not show any obvious erosive changes. Wife is presentAnd she states she is trying to aggressively offload the wound beds by repositioning him on his sides. Unfortunately he has developed two wounds 1 to the right buttocks and 1 to the midline back. 9/5; patient presents for follow-up. Wife is present. She has been using Dakin's wet-to-dry dressings to the ischial wounds and Santyl to the back wounds and right buttocks wound. Overall wounds have improved in appearance and size since last clinic visit. 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 He is here with his wife. 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 His wife tells me she gets him up in the wheelchair at about 7:00 in the morning and he is there till 5:00 at night when they put him to bed. He is apparently eating and drinking well. We have been using Dakin's wet to dry did the stage IV wounds on his ischial tuberosities and Santyl with Hydrofera  Blue to a small area on his back at roughly T4 10/3 patient with advanced multiple sclerosis. He has stage IV wounds over both ischial tuberosities and more superficial area on his back. He has been using Dakin's wet-to-dry on the ischial tuberosity wounds Santyl with Hydrofera Blue to the wound on his back 10/7; patient with advanced multiple sclerosis he has stage IV wounds over [name] at the ischial tuberosities more superficial areas on his spinal process. He still has an open area on the back. He has been using Dakin's wet-to-dry on the ischial tuberosity wounds Santyl with Hydrofera Blue to the wound on his back that still remains 10/31; advanced multiple sclerosis with bilateral stage IV wounds over the ischial tuberosities bilaterally. He has 2 areas on his back 1 was a new reopening this time. Been using Hydrofera Blue and Santyl to the areas on his back and Dakin's wet-to-dry to the ischial tuberosity wounds. 12/2; stage IV wounds over both ischial tuberosities. There is still exposed bone but otherwise generally healthy granulation. No major changes in wound dimensions. He has 2 areas on his back 1 has closed over distally the more proximal 1 appears better. Based on the length of time he is in a wheelchair I would view the ischial tuberosity wounds is palliative. [See my note of 07/01/2023]. He uses Dakin's wet to dry on these areas that have a large wound area. Objective Constitutional Sitting or standing Blood Pressure is within target range for patient.. Pulse regular and within target range for patient.Marland Kitchen Respirations regular, non-labored and within target range.. Temperature is normal and within the target range for the patient.Marland Kitchen Appears in no distress. [name] ([id number]) [id number] Page 5  of 7 Vitals Time Taken: 10:55 AM, Height: 70 in, Weight: 176 lbs, BMI: 25.3, Temperature: 97.9 F, Pulse: 59 bpm, Respiratory Rate: 18 breaths/min,  Blood Pressure: 110/73 mmHg. General Notes: Wound exam; large open areas over both ischial tuberosities there is bone exposed in both of these but otherwise generally healthy granulation no other and surrounding soft tissue infection is suspected. The area on the left has a tunnel at 4:00. In the upper back the more distal wound is fully epithelialized superior to this the wound is still open but appears smaller Integumentary (Hair, Skin) Wound #2 status is Open. Original cause of wound was Gradually Appeared. The date acquired was: 05/14/2022. The wound has been in treatment 16 weeks. The wound is located on the Medial Back. The wound measures 1.3cm length x 0.5cm width x 0.1cm depth; 0.511cm^2 area and 0.051cm^3 volume. There is Fat Layer (Subcutaneous Tissue) exposed. There is no tunneling or undermining noted. There is a medium amount of serosanguineous drainage noted. The wound margin is distinct with the outline attached to the wound base. There is small (1-33%) red granulation within the wound bed. There is a large (67-100%) amount of necrotic tissue within the wound bed including Adherent Slough. The periwound skin appearance exhibited: Scarring. The periwound skin appearance did not exhibit: Callus, Crepitus, Excoriation, Induration, Rash, Dry/Scaly, Maceration, Atrophie Blanche, Cyanosis, Ecchymosis, Hemosiderin Staining, Mottled, Pallor, Rubor, Erythema. Periwound temperature was noted as No Abnormality. Wound #3 status is Open. Original cause of wound was Gradually Appeared. The date acquired was: 05/14/2022. The wound has been in treatment 16 weeks. The wound is located on the Left Gluteus. The wound measures 4cm length x 1.5cm width x 1.5cm depth; 4.712cm^2 area and 7.069cm^3 volume. There is bone, muscle, and Fat Layer (Subcutaneous Tissue) exposed. Tunneling has been noted at 1:00 with a maximum distance of 4.2cm. Undermining begins at 2:00 and ends at 10:00 with a maximum distance of  4.5cm. There is a medium amount of serosanguineous drainage noted. The wound margin is distinct with the outline attached to the wound base. There is medium (34-66%) red, pink granulation within the wound bed. There is a medium (34-66%) amount of necrotic tissue within the wound bed including Adherent Slough. The periwound skin appearance exhibited: Scarring, Hemosiderin Staining. The periwound skin appearance did not exhibit: Dry/Scaly, Maceration. Periwound temperature was noted as No Abnormality. Wound #4 status is Open. Original cause of wound was Pressure Injury. The date acquired was: 05/14/2022. The wound has been in treatment 16 weeks. The wound is located on the Right Gluteal fold. The wound measures 3.8cm length x 5.4cm width x 1.4cm depth; 16.116cm^2 area and 22.563cm^3 volume. There is muscle and Fat Layer (Subcutaneous Tissue) exposed. There is no tunneling noted, however, there is undermining starting at 9:00 and ending at 3:00 with a maximum distance of 2.5cm. There is a medium amount of serous drainage noted. The wound margin is distinct with the outline attached to the wound base. There is medium (34-66%) red, pink granulation within the wound bed. There is a small (1-33%) amount of necrotic tissue within the wound bed including Adherent Slough. The periwound skin appearance had no abnormalities noted for texture. The periwound skin appearance had no abnormalities noted for moisture. The periwound skin appearance had no abnormalities noted for color. Wound #5 status is Healed - Epithelialized. Original cause of wound was Pressure Injury. The date acquired was: 05/13/2023. The wound has been in treatment 15 weeks. The wound is located on the Distal,Midline Back.  The wound measures 0cm length x 0cm width x 0cm depth; 0cm^2 area and 0cm^3 volume. There is no tunneling or undermining noted. There is a none present amount of drainage noted. The wound margin is distinct with the outline attached  to the wound base. There is no granulation within the wound bed. There is no necrotic tissue within the wound bed. The periwound skin appearance had no abnormalities noted for moisture. The periwound skin appearance did not exhibit: Callus, Crepitus, Excoriation, Induration, Rash, Scarring, Atrophie Blanche, Cyanosis, Ecchymosis, Hemosiderin Staining, Mottled, Pallor, Rubor, Erythema. Periwound temperature was noted as No Abnormality. Assessment Active Problems ICD-10 Pressure ulcer of right buttock, stage 4 Pressure ulcer of left buttock, stage 4 Pressure ulcer of right buttock, stage 2 Pressure ulcer of unspecified part of back, stage 3 Pressure ulcer of unspecified part of back, stage 2 Moderate protein-calorie malnutrition Multiple sclerosis Neuromuscular dysfunction of bladder, unspecified Plan Follow-up Appointments: Return appointment in 1 month. - ****HOYER ***extra time 60 minutes Dr. Leanord Hawking Please ask front desk for appointment Anesthetic: (In clinic) Topical Lidocaine 4% applied to wound bed Bathing/ Shower/ Hygiene: Other Bathing/Shower/Hygiene Orders/Instructions: - May bathe with soap and water Off-Loading: Turn and reposition every 2 hours Other: - only an hour up to eat and back to bed. Turn every 2 hours. Best to stay in bed turn side to side and back one postition for max of 2 hrs Home Health: New wound care orders this week; continue Home Health for wound care. May utilize formulary equivalent dressing for wound treatment orders unless otherwise specified. - Dakin's solution wet to dry to wounds - Left Gluteus and Right Gluteal Fold and Hydrafera Blue Ready bordered foam to medial back wound (x1) Other Home Health Orders/Instructions: - Please see the patient at least once a week OR please see the patient's spouse regarding wound supplies ( to order supplies for the patient) [facility] WOUND #2: - Back Wound Laterality: Medial Cleanser: Soap and Water 1 x  Per Day/30 Days Discharge Instructions: May shower and wash wound with dial antibacterial soap and water prior to dressing change. Cleanser: Vashe 5.8 (oz) 1 x Per Day/30 Days Discharge Instructions: or Cleanse the wound with Vashe prior to applying a clean dressing using gauze sponges, not tissue or cotton balls. Prim Dressing: Hydrofera Blue Ready Transfer Foam, 2.5x2.5 (in/in) 1 x Per Day/30 Days ary Discharge Instructions: Apply directly to wound bed as directed Secondary Dressing: Zetuvit Plus Silicone Border Dressing 4x4 (in/in) 1 x Per Day/30 Days Discharge Instructions: Apply silicone border over primary dressing as directed. WOUND #3: - Gluteus Wound Laterality: Left [name], Yoon F ([id number]) [id number] Page 6 of 7 Cleanser: Vashe 5.8 (oz) (Home Health) (Generic) 1 x Per Day/30 Days Discharge Instructions: Cleanse the wound with Vashe prior to applying a clean dressing using gauze sponges, not tissue or cotton balls. Prim Dressing: Dakin's Solution 0.25%, 16 (oz) (Home Health) 1 x Per Day/30 Days ary Discharge Instructions: Moisten gauze with Dakin's solution Secondary Dressing: Zetuvit Plus Silicone Border Dressing 7x7(in/in) (Home Health) 1 x Per Day/30 Days Discharge Instructions: Apply silicone border over primary dressing as directed. WOUND #4: - Gluteal fold Wound Laterality: Right Cleanser: Vashe 5.8 (oz) (Home Health) (Generic) 1 x Per Day/30 Days Discharge Instructions: Cleanse the wound with Vashe prior to applying a clean dressing using gauze sponges, not tissue or cotton balls. Prim Dressing: Dakin's Solution 0.25%, 16 (oz) (Home Health) 1 x Per Day/30 Days ary Discharge Instructions: Moisten gauze with Dakin's solution Secondary  Dressing: Zetuvit Plus Silicone Border Dressing 7x7(in/in) (Home Health) 1 x Per Day/30 Days Discharge Instructions: Apply silicone border over primary dressing as directed. 1. We are going to use Dakin's  wet-to-dry to both the ischial tuberosity wounds 2. There is simply too much wound volume here for an obvious alternative. Prisma with backing wet to dry with the my preference although I do not know that we be able to get enough Prisma to cover the base of these wounds. 3. Still Hydrofera Blue on the upper back wounds. 1 of which is healed 4. I am not sure that wound vacs have been tried over the ischial tuberosities although he is spending most of the day up in a wheelchair. I am not confident about the utility in the setting Electronic Signature(s) Signed: 09/13/2023 5:24:09 PM By: Baltazar Najjar MD Entered By: Baltazar Najjar on 09/13/2023 12:38:09 -------------------------------------------------------------------------------- SuperBill Details Patient Name: Date of Service: [name], [name] 09/13/2023 Medical Record Number: [id number] Patient Account Number: [id number] Date of Birth/Sex: Treating RN: Mar 16, 1961 (62 y.o. 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.9 Neuromuscular dysfunction of bladder, unspecified Facility Procedures : CPT4 Code: [id number] Description: 06269 - WOUND CARE VISIT-LEV 5 EST PT Modifier: Quantity: 1 Physician Procedures : CPT4 Code Description Modifier [id number] - WC PHYS LEVEL 3 - EST PT ICD-10 Diagnosis Description L89.314 Pressure ulcer of right buttock, stage 4 L89.324 Pressure ulcer of left buttock, stage 4 L89.103  Pressure ulcer of unspecified part of back,  stage 3 L89.102 Pressure ulcer of unspecified part of back, stage 2 Quantity: 1 Electronic Signature(s) [name], Chucky F ([id number]) [id number] Page 7 of 7 Signed: 09/13/2023 5:24:09 PM By: Baltazar Najjar MD Previous Signature: 09/13/2023 12:28:15 PM Version By: Karie Schwalbe RN Entered By: Baltazar Najjar on 09/13/2023 12:38:34

## 2023-09-18 ENCOUNTER — Encounter: Payer: Self-pay | Admitting: Neurology

## 2023-09-20 ENCOUNTER — Other Ambulatory Visit: Payer: Self-pay | Admitting: *Deleted

## 2023-09-20 MED ORDER — MODAFINIL 100 MG PO TABS: 100.0000 mg | ORAL_TABLET | Freq: Every morning | ORAL | 0 refills | Status: DC

## 2023-09-20 NOTE — Telephone Encounter (Signed)
Pt wife sent mychart asking for refill. Pt last seen 06/15/23 and next f/u 12/28/23. Last refilled 07/08/23 #90.

## 2023-09-21 ENCOUNTER — Telehealth: Payer: Self-pay | Admitting: *Deleted

## 2023-09-21 NOTE — Telephone Encounter (Signed)
Faxed back completed [facility] form [id number] to [place] at [phone].  Received fax confirmation.

## 2023-09-27 ENCOUNTER — Ambulatory Visit (HOSPITAL_BASED_OUTPATIENT_CLINIC_OR_DEPARTMENT_OTHER): Payer: No Typology Code available for payment source | Admitting: Internal Medicine

## 2023-10-14 ENCOUNTER — Encounter (HOSPITAL_BASED_OUTPATIENT_CLINIC_OR_DEPARTMENT_OTHER): Payer: No Typology Code available for payment source | Attending: Internal Medicine | Admitting: Internal Medicine

## 2023-10-14 DIAGNOSIS — N319 Neuromuscular dysfunction of bladder, unspecified: Secondary | ICD-10-CM | POA: Insufficient documentation

## 2023-10-14 DIAGNOSIS — E43 Unspecified severe protein-calorie malnutrition: Secondary | ICD-10-CM | POA: Insufficient documentation

## 2023-10-14 DIAGNOSIS — G35 Multiple sclerosis: Secondary | ICD-10-CM | POA: Diagnosis not present

## 2023-10-14 DIAGNOSIS — L89324 Pressure ulcer of left buttock, stage 4: Secondary | ICD-10-CM | POA: Diagnosis not present

## 2023-10-14 DIAGNOSIS — L89103 Pressure ulcer of unspecified part of back, stage 3: Secondary | ICD-10-CM | POA: Insufficient documentation

## 2023-10-14 DIAGNOSIS — L89314 Pressure ulcer of right buttock, stage 4: Secondary | ICD-10-CM | POA: Diagnosis present

## 2023-10-14 DIAGNOSIS — Z6825 Body mass index (BMI) 25.0-25.9, adult: Secondary | ICD-10-CM | POA: Insufficient documentation

## 2023-10-14 DIAGNOSIS — L89312 Pressure ulcer of right buttock, stage 2: Secondary | ICD-10-CM | POA: Insufficient documentation

## 2023-10-14 DIAGNOSIS — L89102 Pressure ulcer of unspecified part of back, stage 2: Secondary | ICD-10-CM | POA: Diagnosis not present

## 2023-10-15 NOTE — Progress Notes (Signed)
 Dunlap, Kodee [name] ([id number]) [id number] Page 1 of 11 Visit Report for 10/14/2023 Arrival Information Details Patient Name: Date of Service: [name] Mark ARLINE [name] [name]. 10/14/2023 10:00 A M Medical Record Number: [id number] Patient Account Number: [id number] Date of Birth/Sex: Treating RN: May 07, 1961 (63 y.o. M) Primary Care Cooper Stamp: [facility] Other Clinician: Referring Jamiah Homeyer: Treating Kim Oki/Extender: Rufus Sharper [facility], [facility] in Treatment: 21 Visit Information History Since Last Visit Added or deleted any medications: No Patient Arrived: Wheel Chair Any new allergies or adverse reactions: No Arrival Time: 09:43 Had a fall or experienced change in No Accompanied By: wife activities of daily living that may affect Transfer Assistance: Deitra Lift risk of falls: Patient Identification Verified: Yes Signs or symptoms of abuse/neglect since last visito No Secondary Verification Process Completed: Yes Hospitalized since last visit: No Patient Requires Transmission-Based Precautions: No Implantable device outside of the clinic excluding No Patient Has Alerts: No cellular tissue based products placed in the center since last visit: Has Dressing in Place as Prescribed: Yes Pain Present Now: Yes Electronic Signature(s) Signed: 10/14/2023 3:57:02 PM By: Wyn Iha Entered By: Wyn Iha on 10/14/2023 09:43:59 -------------------------------------------------------------------------------- Clinic Level of Care Assessment Details Patient Name: Date of Service: [name], A DA M [name]. 10/14/2023 10:00 A M Medical Record Number: [id number] Patient Account Number: [id number] Date of Birth/Sex: Treating RN: [birth year] (63 y.o. [name] Primary Care Treson Laura: [facility] Other Clinician: Referring Hagan Vanauken: Treating Nyshaun Standage/Extender: Rufus Sharper Nicoletta [facility] in Treatment: 21 Clinic Level of Care  Assessment Items TOOL 4 Quantity Score X- 1 0 Use when only an EandM is performed on FOLLOW-UP visit ASSESSMENTS - Nursing Assessment / Reassessment X- 1 10 Reassessment of Co-morbidities (includes updates in patient status) X- 1 5 Reassessment of Adherence to Treatment Plan ASSESSMENTS - Wound and Skin A ssessment / Reassessment []  - 0 Simple Wound Assessment / Reassessment - one wound X- 1 5 Complex Wound Assessment / Reassessment - multiple wounds X- 1 10 Dermatologic / Skin Assessment (not related to wound area) ASSESSMENTS - Focused Assessment []  - 0 Circumferential Edema Measurements - multi extremities X- 1 10 Nutritional Assessment / Counseling / Intervention [name] ([id number]) [id number] Page 2 of 11 []  - 0 Lower Extremity Assessment (monofilament, tuning fork, pulses) []  - 0 Peripheral Arterial Disease Assessment (using hand held doppler) ASSESSMENTS - Ostomy and/or Continence Assessment and Care []  - 0 Incontinence Assessment and Management []  - 0 Ostomy Care Assessment and Management (repouching, etc.) PROCESS - Coordination of Care []  - 0 Simple Patient / Family Education for ongoing care X- 1 20 Complex (extensive) Patient / Family Education for ongoing care X- 1 10 Staff obtains Chiropractor, Records, T Results / Process Orders est []  - 0 Staff telephones HHA, Nursing Homes / Clarify orders / etc []  - 0 Routine Transfer to another Facility (non-emergent condition) []  - 0 Routine Hospital Admission (non-emergent condition) []  - 0 New Admissions / Manufacturing Engineer / Ordering NPWT Apligraf, etc. , []  - 0 Emergency Hospital Admission (emergent condition) []  - 0 Simple Discharge Coordination X- 1 15 Complex (extensive) Discharge Coordination PROCESS - Special Needs []  - 0 Pediatric / Minor Patient Management []  - 0 Isolation Patient Management []  - 0 Hearing / Language / Visual special needs []  -  0 Assessment of Community assistance (transportation, D/C planning, etc.) []  - 0 Additional assistance / Altered mentation []  - 0 Support Surface(s) Assessment (bed, cushion, seat, etc.) INTERVENTIONS - Wound Cleansing / Measurement []  -  0 Simple Wound Cleansing - one wound X- 3 5 Complex Wound Cleansing - multiple wounds X- 1 5 Wound Imaging (photographs - any number of wounds) []  - 0 Wound Tracing (instead of photographs) []  - 0 Simple Wound Measurement - one wound X- 3 5 Complex Wound Measurement - multiple wounds INTERVENTIONS - Wound Dressings []  - 0 Small Wound Dressing one or multiple wounds X- 2 15 Medium Wound Dressing one or multiple wounds []  - 0 Large Wound Dressing one or multiple wounds []  - 0 Application of Medications - topical []  - 0 Application of Medications - injection INTERVENTIONS - Miscellaneous []  - 0 External ear exam []  - 0 Specimen Collection (cultures, biopsies, blood, body fluids, etc.) []  - 0 Specimen(s) / Culture(s) sent or taken to Lab for analysis []  - 0 Patient Transfer (multiple staff / Nurse, Adult / Similar devices) []  - 0 Simple Staple / Suture removal (25 or less) []  - 0 Complex Staple / Suture removal (26 or more) []  - 0 Hypo / Hyperglycemic Management (close monitor of Blood Glucose) [name], [name] ([id number]) [id number] Page 3 of 11 []  - 0 Ankle / Brachial Index (ABI) - do not check if billed separately X- 1 5 Vital Signs Has the patient been seen at the hospital within the last three years: Yes Total Score: 155 Level Of Care: New/Established - Level 4 Electronic Signature(s) Signed: 10/15/2023 12:56:04 PM By: Drury Nestle RN, BSN Entered By: Drury [name] on 10/14/2023 10:14:40 -------------------------------------------------------------------------------- Encounter Discharge Information Details Patient Name: Date of Service: Mark Mark LAN, A DA M [name]. 10/14/2023 10:00 A M Medical Record Number:  [id number] Patient Account Number: [id number] Date of Birth/Sex: Treating RN: [birth year] (62 y.o. [name] Primary Care Jeani Fassnacht: [facility] Other Clinician: Referring Nickson Middlesworth: Treating Terasa Orsini/Extender: Rufus Ozell Nicoletta [facility] in Treatment: 21 Encounter Discharge Information Items Discharge Condition: Stable Ambulatory Status: Wheelchair Discharge Destination: Home Transportation: Private Auto Accompanied By: wife Schedule Follow-up Appointment: Yes Clinical Summary of Care: Electronic Signature(s) Signed: 10/15/2023 12:56:04 PM By: Drury Nestle RN, BSN Entered By: Drury [name] on 10/14/2023 11:05:23 -------------------------------------------------------------------------------- Lower Extremity Assessment Details Patient Name: Date of Service: Mark Mark LAN, A DA M [name]. 10/14/2023 10:00 A M Medical Record Number: [id number] Patient Account Number: [id number] Date of Birth/Sex: Treating RN: 1960-11-24 (62 y.o. M) Primary Care Reika Callanan: [facility] Other Clinician: Referring Vash Quezada: Treating Melanee Cordial/Extender: Rufus Ozell Nicoletta [facility] in Treatment: 21 Electronic Signature(s) Signed: 10/14/2023 3:57:02 PM By: Wyn Iha Entered By: Wyn Iha on 10/14/2023 09:45:42 -------------------------------------------------------------------------------- Multi Wound Chart Details Patient Name: Date of Service: Mark Mark LAN, A DA M [name]. 10/14/2023 10:00 A M Medical Record Number: [id number] Patient Account Number: [id number] [name], [name] ([id number]) [id number] Page 4 of 11 Date of Birth/Sex: Treating RN: 11-11-1960 (63 y.o. M) Primary Care Trenise Turay: Other Clinician: [facility] Referring Ezechiel Stooksbury: Treating Raymir Frommelt/Extender: Rufus Ozell Nicoletta [facility] in Treatment: 21 Vital Signs Height(in): 70 Pulse(bpm): 57 Weight(lbs): 176 Blood Pressure(mmHg): 109/73 Body Mass  Index(BMI): 25.3 Temperature([name]): 97.9 Respiratory Rate(breaths/min): 16 [2:Photos:] Medial Back Left Gluteus Right Gluteal fold Wound Location: Gradually Appeared Gradually Appeared Pressure Injury Wounding Event: Pressure Ulcer Pressure Ulcer Pressure Ulcer Primary Etiology: Asthma Asthma Asthma Comorbid History: 05/14/2022 05/14/2022 05/14/2022 Date Acquired: 21 21 21  Weeks of Treatment: Open Open Open Wound Status: No No No Wound Recurrence: 0x0x0 5.5x3.3x1.7 6.3x4.2x1.8 Measurements L x W x D (cm) 0 14.255 20.782 A (cm) : rea 0 24.233 37.407 Volume (cm) : 100.00% -10.00% 10.40% % Reduction in  A rea: 100.00% 46.60% 53.90% % Reduction in Volume: 12 7 Position 1 (o'clock): 3.8 1.8 Maximum Distance 1 (cm): No Yes Yes Tunneling: Category/Stage III Category/Stage IV Category/Stage IV Classification: Medium Medium Medium Exudate A mount: Serosanguineous Serosanguineous Serous Exudate Type: red, brown red, brown amber Exudate Color: Distinct, outline attached Distinct, outline attached Distinct, outline attached Wound Margin: None Present (0%) Large (67-100%) Medium (34-66%) Granulation A mount: N/A Red, Pink Red, Pink Granulation Quality: None Present (0%) Small (1-33%) Small (1-33%) Necrotic A mount: Fascia: No Fat Layer (Subcutaneous Tissue): Yes Fat Layer (Subcutaneous Tissue): Yes Exposed Structures: Fat Layer (Subcutaneous Tissue): No Muscle: Yes Muscle: Yes Tendon: No Bone: Yes Fascia: No Muscle: No Tendon: No Joint: No Joint: No Bone: No Bone: No Large (67-100%) Small (1-33%) Small (1-33%) Epithelialization: Scarring: Yes Scarring: Yes Periwound Skin Texture: Excoriation: No Induration: No Callus: No Crepitus: No Rash: No Maceration: No Maceration: No Maceration: Yes Periwound Skin Moisture: Dry/Scaly: No Dry/Scaly: No Atrophie Blanche: No Hemosiderin Staining: Yes No Abnormalities Noted Periwound Skin Color: Cyanosis: No Ecchymosis:  No Erythema: No Hemosiderin Staining: No Mottled: No Pallor: No Rubor: No No Abnormality No Abnormality N/A Temperature: Treatment Notes Electronic Signature(s) Signed: 10/14/2023 4:03:36 PM By: Rufus Sharper MD Entered By: Rufus Sharper on 10/14/2023 10:22:00 Mark, [name] ([id number]) [id number] Page 5 of 11 -------------------------------------------------------------------------------- Multi-Disciplinary Care Plan Details Patient Name: Date of Service: [name] Mark ARLINE [name] [name]. 10/14/2023 10:00 A M Medical Record Number: [id number] Patient Account Number: [id number] Date of Birth/Sex: Treating RN: 1961/05/06 (63 y.o. [name] Primary Care Dellie Piasecki: [facility] Other Clinician: Referring Paschal Blanton: Treating Sabatino Williard/Extender: [facility], [facility] in Treatment: 21 Active Inactive Wound/Skin Impairment Nursing Diagnoses: Knowledge deficit related to smoking impact on wound healing Goals: Patient/caregiver will verbalize understanding of skin care regimen Date Initiated: 05/18/2023 Target Resolution Date: 11/12/2023 Goal Status: Active Interventions: Assess patient/caregiver ability to obtain necessary supplies Assess patient/caregiver ability to perform ulcer/skin care regimen upon admission and as needed Assess ulceration(s) every visit Provide education on ulcer and skin care Screen for HBO Treatment Activities: Skin care regimen initiated : 05/18/2023 Topical wound management initiated : 05/18/2023 Notes: Electronic Signature(s) Signed: 10/15/2023 12:56:04 PM By: Drury Nestle RN, BSN Entered By: Drury [name] on 10/14/2023 10:03:06 -------------------------------------------------------------------------------- Pain Assessment Details Patient Name: Date of Service: [name], A DA M [name]. 10/14/2023 10:00 A M Medical Record Number: [id number] Patient Account Number: [id number] Date of Birth/Sex: Treating  RN: Oct 11, 1961 (62 y.o. M) Primary Care Kesa Birky: [facility] Other Clinician: Referring Lovada Barwick: Treating Chas Axel/Extender: Rufus Sharper Nicoletta [facility] in Treatment: 21 Active Problems Location of Pain Severity and Description of Pain Patient Has Paino Yes Site Locations Pain Location: MUAAZ, BRAU ([id number]) [id number] Page 6 of 11 Pain Location: Generalized Pain Rate the pain. Current Pain Level: 4 Pain Management and Medication Current Pain Management: Medication: No Cold Application: No Rest: No Massage: No Activity: No T.E.N.S.: No Heat Application: No Leg drop or elevation: No Is the Current Pain Management Adequate: Adequate How does your wound impact your activities of daily livingo Sleep: No Bathing: No Appetite: No Relationship With Others: No Bladder Continence: No Emotions: No Bowel Continence: No Work: No Toileting: No Drive: No Dressing: No Hobbies: No Electronic Signature(s) Signed: 10/14/2023 3:57:02 PM By: Wyn Iha Entered By: Wyn Iha on 10/14/2023 09:45:32 -------------------------------------------------------------------------------- Patient/Caregiver Education Details Patient Name: Date of Service: [name] Mark ARLINE [name] PHEBE 1/2/2025andnbsp10:00 A M Medical Record Number: [id number] Patient Account Number: [id number] Date  of Birth/Gender: Treating RN: 1960-12-01 (62 y.o. [name] Primary Care Physician: [facility] Other Clinician: Referring Physician: Treating Physician/Extender: Rufus Ozell Nicoletta [facility] in Treatment: 21 Education Assessment Education Provided To: Patient Education Topics Provided Wound/Skin Impairment: Handouts: Caring for Your Ulcer Methods: Explain/Verbal Responses: Reinforcements needed Electronic Signature(s) Signed: 10/15/2023 12:56:04 PM By: Drury Nestle RN, BSN Entered By: Drury [name] on 10/14/2023  10:03:19 [name], [name] ([id number]) [id number] Page 7 of 11 -------------------------------------------------------------------------------- Wound Assessment Details Patient Name: Date of Service: [name] Mark ARLINE [name] [name]. 10/14/2023 10:00 A M Medical Record Number: [id number] Patient Account Number: [id number] Date of Birth/Sex: Treating RN: 09/09/1961 (63 y.o. Mark Cammie Sailors Primary Care Theadore Blunck: [facility], [place] Other Clinician: Referring Abrey Bradway: Treating Rashaun Curl/Extender: [facility]ufus [facility]ell [facility] in Treatment: 21 Wound Status Wound Number: 2 Primary Etiology: Pressure Ulcer Wound Location: Medial Back Wound Status: Open Wounding Event: Gradually Appeared Comorbid History: Asthma Date Acquired: 05/14/2022 Weeks Of Treatment: 21 Clustered Wound: No Photos Wound Measurements Length: (cm) Width: (cm) Depth: (cm) Area: (cm) Volume: (cm) 0 % Reduction in Area: 100% 0 % Reduction in Volume: 100% 0 Epithelialization: Large (67-100%) 0 Tunneling: No 0 Undermining: No Wound Description Classification: Category/Stage III Wound Margin: Distinct, outline attached Exudate Amount: Medium Exudate Type: Serosanguineous Exudate Color: red, brown Foul Odor After Cleansing: No Slough/Fibrino Yes Wound Bed Granulation Amount: None Present (0%) Exposed Structure Necrotic Amount: None Present (0%) Fascia Exposed: No Fat Layer (Subcutaneous Tissue) Exposed: No Tendon Exposed: No Muscle Exposed: No Joint Exposed: No Bone Exposed: No Periwound Skin Texture Texture Color No Abnormalities Noted: No No Abnormalities Noted: No Callus: No Atrophie Blanche: No Crepitus: No Cyanosis: No Excoriation: No Ecchymosis: No Induration: No Erythema: No Rash: No Hemosiderin Staining: No Scarring: Yes Mottled: No Pallor: No Moisture Rubor: No No Abnormalities Noted: No Dry / Scaly: No Temperature / Pain Maceration: No Temperature: No  Abnormality [name], [name] ([id number]) [id number] Page 8 of 11 Electronic Signature(s) Signed: 10/14/2023 4:27:30 PM By: Cammie Sailors RN, BSN Entered By: Cammie Sailors on 10/14/2023 10:02:57 -------------------------------------------------------------------------------- Wound Assessment Details Patient Name: Date of Service: [name], A DA M [name]. 10/14/2023 10:00 A M Medical Record Number: [id number] Patient Account Number: [id number] Date of Birth/Sex: Treating RN: 01-14-1961 (62 y.o. Mark Cammie Sailors Primary Care Vicky Schleich: [facility], [place] Other Clinician: Referring Demtrius Rounds: Treating Maleik Vanderzee/Extender: [facility]uf[facility]ell [facility] in Treatment: 21 Wound Status Wound Number: 3 Primary Etiology: Pressure Ulcer Wound Location: Left Gluteus Wound Status: Open Wounding Event: Gradually Appeared Comorbid History: Asthma Date Acquired: 05/14/2022 Weeks Of Treatment: 21 Clustered Wound: No Photos Wound Measurements Length: (cm) 5.5 Width: (cm) 3.3 Depth: (cm) 1.7 Area: (cm) 14.255 Volume: (cm) 24.233 % Reduction in Area: -10% % Reduction in Volume: 46.6% Epithelialization: Small (1-33%) Tunneling: Yes Position (o'clock): 12 Maximum Distance: (cm) 3.8 Undermining: No Wound Description Classification: Category/Stage IV Wound Margin: Distinct, outline attached Exudate Amount: Medium Exudate Type: Serosanguineous Exudate Color: red, brown Foul Odor After Cleansing: No Slough/Fibrino No Wound Bed Granulation Amount: Large (67-100%) Exposed Structure Granulation Quality: Red, Pink Fat Layer (Subcutaneous Tissue) Exposed: Yes Necrotic Amount: Small (1-33%) Muscle Exposed: Yes Necrotic Quality: Adherent Slough Necrosis of Muscle: No Bone Exposed: Yes Periwound Skin Texture Texture Color No Abnormalities Noted: No No Abnormalities Noted: No Scarring: Yes Hemosiderin Staining: Yes Moisture Temperature / Pain [name], Mark  [name] ([id number]) [id number] Page 9 of 11 No Abnormalities Noted: No Temperature: No Abnormality Dry / Scaly: No Maceration: No Treatment Notes Wound #3 (Gluteus)  Wound Laterality: Left Cleanser Vashe 5.8 (oz) Discharge Instruction: Cleanse the wound with Vashe prior to applying a clean dressing using gauze sponges, not tissue or cotton balls. Peri-Wound Care Topical Primary Dressing Dakin's Solution 0.25%, 16 (oz) Discharge Instruction: Moisten gauze with Dakin's solution Secondary Dressing Zetuvit Plus Silicone Border Dressing 7x7(in/in) Discharge Instruction: Apply silicone border over primary dressing as directed. Secured With Compression Wrap Compression Stockings Facilities Manager) Signed: 10/14/2023 4:27:30 PM By: Cammie Sailors RN, BSN Entered By: Cammie Sailors on 10/14/2023 10:05:03 -------------------------------------------------------------------------------- Wound Assessment Details Patient Name: Date of Service: Mark Mark LAN, A DA M [name]. 10/14/2023 10:00 A M Medical Record Number: [id number] Patient Account Number: [id number] Date of Birth/Sex: Treating RN: [birth year] (62 y.o. Mark Cammie Sailors Primary Care Rosealyn Little: [facility] Other Clinician: Referring Joenathan Sakuma: Treating Livvy Spilman/Extender: Rufus Sharper [facility], [facility] in Treatment: 21 Wound Status Wound Number: 4 Primary Etiology: Pressure Ulcer Wound Location: Right Gluteal fold Wound Status: Open Wounding Event: Pressure Injury Comorbid History: Asthma Date Acquired: 05/14/2022 Weeks Of Treatment: 21 Clustered Wound: No Photos Wound Measurements [place], Mikail [name] ([id number]) Length: (cm) 6.3 Width: (cm) 4.2 Depth: (cm) 1.8 Area: (cm) 20.782 Volume: (cm) 37.407 [id number] Page 10 of 11 % Reduction in Area: 10.4% % Reduction in Volume: 53.9% Epithelialization: Small (1-33%) Tunneling: Yes Position (o'clock):  7 Maximum Distance: (cm) 1.8 Undermining: No Wound Description Classification: Category/Stage IV Wound Margin: Distinct, outline attached Exudate Amount: Medium Exudate Type: Serous Exudate Color: amber Foul Odor After Cleansing: No Slough/Fibrino No Wound Bed Granulation Amount: Medium (34-66%) Exposed Structure Granulation Quality: Red, Pink Fascia Exposed: No Necrotic Amount: Small (1-33%) Fat Layer (Subcutaneous Tissue) Exposed: Yes Necrotic Quality: Adherent Slough Tendon Exposed: No Muscle Exposed: Yes Necrosis of Muscle: No Joint Exposed: No Bone Exposed: No Periwound Skin Texture Texture Color No Abnormalities Noted: No No Abnormalities Noted: Yes Moisture No Abnormalities Noted: No Maceration: Yes Treatment Notes Wound #4 (Gluteal fold) Wound Laterality: Right Cleanser Vashe 5.8 (oz) Discharge Instruction: Cleanse the wound with Vashe prior to applying a clean dressing using gauze sponges, not tissue or cotton balls. Peri-Wound Care Topical Primary Dressing Dakin's Solution 0.25%, 16 (oz) Discharge Instruction: Moisten gauze with Dakin's solution Secondary Dressing Zetuvit Plus Silicone Border Dressing 7x7(in/in) Discharge Instruction: Apply silicone border over primary dressing as directed. Secured With Compression Wrap Compression Stockings Facilities Manager) Signed: 10/14/2023 4:27:30 PM By: Cammie Sailors RN, BSN Entered By: Cammie Sailors on 10/14/2023 10:06:33 Vitals Details -------------------------------------------------------------------------------- Nuncio, Aarya [name] ([id number]) [id number] Page 11 of 11 Patient Name: Date of Service: [name] [name]. 10/14/2023 10:00 A M Medical Record Number: [id number] Patient Account Number: [id number] Date of Birth/Sex: Treating RN: May 14, 1961 (63 y.o. M) Primary Care Shylah Dossantos: [facility] Other Clinician: Referring Agape Hardiman: Treating Keenya Matera/Extender:  Rufus Sharper Nicoletta [facility] in Treatment: 21 Vital Signs Time Taken: 09:44 Temperature ([name]): 97.9 Height (in): 70 Pulse (bpm): 57 Weight (lbs): 176 Respiratory Rate (breaths/min): 16 Body Mass Index (BMI): 25.3 Blood Pressure (mmHg): 109/73 Reference Range: 80 - 120 mg / dl Electronic Signature(s) Signed: 10/14/2023 3:57:02 PM By: Wyn Iha Entered By: Wyn Iha on 10/14/2023 09:45:07

## 2023-10-15 NOTE — Progress Notes (Signed)
 Bartling, Winton F ([id number]) [id number] Page 1 of 6 Visit Report for 10/14/2023 HPI Details Patient Name: Date of Service: [name] DELENA ARLINE CHRISTELLA F. 10/14/2023 10:00 Mark M Medical Record Number: [id number] Patient Account Number: [id number] Date of Birth/Sex: Treating RN: [birth year] (63 y.o. M) Primary Care Provider: [facility] Other Clinician: Referring Provider: Treating Provider/Extender: Rufus Ozell Nicoletta [facility] in Treatment: 21 History of Present Illness HPI Description: 05/18/2023 Mr. Varick Keys is Mark 63 year old male with Mark past medical history of multiple sclerosis and neurogenic bladder with chronic indwelling Foley catheter that presents to the clinic for Mark 1 year history of nonhealing ulcers to the bilateral ischium As well as upper back. He has an air mattress. He spends about 50% of the time in the bed and 50% of the time in the wheelchair. He does not have Mark Roho cushion. He has been using Medihoney and Santyl  to the wound beds. He currently denies systemic signs of infection. Of note he was hospitalized on 2 occasions in January 2024 for trigeminal neuralgia. He has lost Mark significant amount of weight due to this and his oral intake has declined. He was evaluated for PEG tube but was not Mark candidate. 8/19; patient presents for follow-up. Patient has home health. He has been using Dakin's wet-to-dry dressings to the ischial wounds and Santyl  to the back wound. Patient was recently hospitalized for UTI and received IV Zosyn  for 3 days followed by fosfomycin. He completed 6 days of antibiotics. He currently denies systemic signs of infection. He also had Mark portable pelvic x-ray that did not show any obvious erosive changes. Wife is presentAnd she states she is trying to aggressively offload the wound beds by repositioning him on his sides. Unfortunately he has developed two wounds 1 to the right buttocks and 1 to the midline back. 9/5;  patient presents for follow-up. Wife is present. She has been using Dakin's wet-to-dry dressings to the ischial wounds and Santyl  to the back wounds and right buttocks wound. Overall wounds have improved in appearance and size since last clinic visit. 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 He is here with his wife. 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 His wife tells me she gets him up in the wheelchair at about 7:00 in the morning and he is there till 5:00 at night when they put him to bed. He is apparently eating and drinking well. We have been using Dakin's wet to dry did the stage IV wounds on his ischial tuberosities and Santyl  with Hydrofera Blue to Mark small area on his back at roughly T4 10/3 patient with advanced multiple sclerosis. He has stage IV wounds over both ischial tuberosities and more superficial area on his back. He has been using Dakin's wet-to-dry on the ischial tuberosity wounds Santyl  with Hydrofera Blue to the wound on his back 10/7; patient with advanced multiple sclerosis he has stage IV wounds over [name] at the ischial tuberosities more superficial areas on his spinal process. He still has an open area on the back. He has been using Dakin's wet-to-dry on the ischial tuberosity wounds Santyl  with Hydrofera Blue to the wound on his back that still remains 10/31; advanced multiple sclerosis with bilateral stage IV wounds over the ischial tuberosities bilaterally. He has 2 areas on his back 1 was  Mark new reopening this time. Been using Hydrofera Blue and Santyl  to the areas on his back and Dakin's wet-to-dry to the ischial tuberosity wounds. 12/2; stage IV wounds over both ischial tuberosities. There is still exposed bone but otherwise generally healthy granulation. No major changes in  wound dimensions. He has 2 areas on his back 1 has closed over distally the more proximal 1 appears better. Based on the length of time he is in Mark wheelchair I would view the ischial tuberosity wounds is palliative. [See my note of 07/01/2023]. He uses Dakin's wet to dry on these areas that have Mark large wound area. 10/14/2023. Patient arrives in clinic. The 2 smaller areas on his thoracic spine are both closed. He has the 2 areas on his bilateral ischial tuberosities. He has been using Dakin's wet-to-dry on these areas and although the granulation looks healthy there is still areas of exposed bone. In discussing things with his wife I think she has the balance the amount of time he is in bed offloading his wounds with functional decline when he spends too much time in bed and quality of life issues. Electronic Signature(s) Signed: 10/14/2023 4:03:36 PM By: Rufus Sharper MD Entered By: Rufus Sharper on 10/14/2023 10:24:44 -------------------------------------------------------------------------------- Physical Exam Details Patient Name: Date of Service: Mark DELENA [name], Mark DA M F. 10/14/2023 10:00 Mark M Medical Record Number: [id number] Patient Account Number: [id number] Date of Birth/Sex: Treating RN: 02/21/1961 (62 y.o. NETTY IDE, Kolbee F ([id number]) [id number] Page 2 of 6 Primary Care Provider: [facility] Other Clinician: Referring Provider: Treating Provider/Extender: Rufus Sharper [facility] in Treatment: 21 Constitutional Sitting or standing Blood Pressure is within target range for patient.. Pulse regular and within target range for patient.SABRA Respirations regular, non-labored and within target range.. Temperature is normal and within the target range for the patient.SABRA Appears in no distress. Notes Wound exam; the areas on his upper thoracic spine are all healed. He still has Mark stage IV wounds or over his ischial tuberosities both of which  are large and have some degree of exposed bone. Fortunately no evidence of surrounding soft tissue infection. Electronic Signature(s) Signed: 10/14/2023 4:03:36 PM By: Rufus Sharper MD Entered By: Rufus Sharper on 10/14/2023 10:25:52 -------------------------------------------------------------------------------- Physician Orders Details Patient Name: Date of Service: Mark DELENA [name], Mark DA M F. 10/14/2023 10:00 Mark M Medical Record Number: [id number] Patient Account Number: [id number] Date of Birth/Sex: Treating RN: [birth year] (62 y.o. NETTY Drury Nestle Primary Care Provider: [facility] Other Clinician: Referring Provider: Treating Provider/Extender: Rufus Sharper [facility] in Treatment: 21 The following information was scribed by: Drury Nestle The information was scribed for: Rufus Sharper Verbal / Phone Orders: No Diagnosis Coding ICD-10 Coding Code Description L89.314 Pressure ulcer of right buttock, stage 4 L89.324 Pressure ulcer of left buttock, stage 4 L89.312 Pressure ulcer of right buttock, stage 2 L89.103 Pressure ulcer of unspecified part of back, stage 3 L89.102 Pressure ulcer of unspecified part of back, stage 2 E44.0 Moderate protein-calorie malnutrition G35 Multiple sclerosis N31.9 Neuromuscular dysfunction of bladder, unspecified Follow-up Appointments Return appointment in 1 month. - ****HOYER ***extra time 60 minutes Dr. Rosan Riggs office to schedule) Anesthetic (In clinic) Topical Lidocaine  4% applied to wound bed Bathing/ Shower/ Hygiene Other Bathing/Shower/Hygiene Orders/Instructions: - May bathe with soap and water Off-Loading Low air-loss mattress (Group 2) Turn and reposition every 2 hours Other: - only an hour up to eat and back to bed. Turn every 2 hours. Best to stay in bed turn side  to side and back one postition for max of 2 hrs Additional Orders / Instructions Follow Nutritious Diet - increase protein Other: - protect  the back with gauze or pads 1-3 weeks. Juven Shake 1-2 times daily. Home Health No change in wound care orders this week; continue Home Health for wound care. May utilize formulary equivalent dressing for wound treatment orders unless otherwise specified. - Dakin's solution wet to dry to wounds - Left Gluteus and Right Gluteal Fold Other Home Health Orders/Instructions: - Please see the patient at least once Mark week OR please see the patient's spouse regarding wound supplies ( to order supplies for the patient) Medford, Rishit F ([id number]) [id number] Page 3 of 6 [facility] Wound Treatment Wound #3 - Gluteus Wound Laterality: Left Cleanser: Vashe 5.8 (oz) (Home Health) (Generic) 1 x Per Day/30 Days Discharge Instructions: Cleanse the wound with Vashe prior to applying Mark clean dressing using gauze sponges, not tissue or cotton balls. Prim Dressing: Dakin's Solution 0.25%, 16 (oz) (Home Health) 1 x Per Day/30 Days ary Discharge Instructions: Moisten gauze with Dakin's solution Secondary Dressing: Zetuvit Plus Silicone Border Dressing 7x7(in/in) (Home Health) 1 x Per Day/30 Days Discharge Instructions: Apply silicone border over primary dressing as directed. Wound #4 - Gluteal fold Wound Laterality: Right Cleanser: Vashe 5.8 (oz) (Home Health) (Generic) 1 x Per Day/30 Days Discharge Instructions: Cleanse the wound with Vashe prior to applying Mark clean dressing using gauze sponges, not tissue or cotton balls. Prim Dressing: Dakin's Solution 0.25%, 16 (oz) (Home Health) 1 x Per Day/30 Days ary Discharge Instructions: Moisten gauze with Dakin's solution Secondary Dressing: Zetuvit Plus Silicone Border Dressing 7x7(in/in) (Home Health) 1 x Per Day/30 Days Discharge Instructions: Apply silicone border over primary dressing as directed. Electronic Signature(s) Signed: 10/14/2023 4:03:36 PM By: Rufus Sharper MD Signed: 10/15/2023 12:56:04 PM By: Drury Nestle RN,  BSN Entered By: Drury Nestle on 10/14/2023 10:14:00 -------------------------------------------------------------------------------- Problem List Details Patient Name: Date of Service: Mark DELENA [name], Mark DA M F. 10/14/2023 10:00 Mark M Medical Record Number: [id number] Patient Account Number: [id number] Date of Birth/Sex: Treating RN: 12-10-1960 (62 y.o. NETTY Drury Nestle Primary Care Provider: [facility] Other Clinician: Referring Provider: Treating Provider/Extender: Rufus Sharper [facility] in Treatment: 21 Active Problems ICD-10 Encounter Code Description Active Date MDM Diagnosis L89.314 Pressure ulcer of right buttock, stage 4 05/18/2023 No Yes L89.324 Pressure ulcer of left buttock, stage 4 05/18/2023 No Yes E44.0 Moderate protein-calorie malnutrition 05/18/2023 No Yes G35 Multiple sclerosis 05/18/2023 No Yes N31.9 Neuromuscular dysfunction of bladder, unspecified 05/18/2023 No Yes Inactive Problems Bagg, Traquan F ([id number]) [id number] Page 4 of 6 ICD-10 Code Description Active Date Inactive Date L89.103 Pressure ulcer of unspecified part of back, stage 3 05/18/2023 05/18/2023 L89.102 Pressure ulcer of unspecified part of back, stage 2 05/31/2023 05/31/2023 L89.312 Pressure ulcer of right buttock, stage 2 05/31/2023 05/31/2023 Resolved Problems Electronic Signature(s) Signed: 10/14/2023 4:03:36 PM By: Rufus Sharper MD Entered By: Rufus Sharper on 10/14/2023 10:21:14 -------------------------------------------------------------------------------- Progress Note Details Patient Name: Date of Service: Mark DELENA [name], Mark DA M F. 10/14/2023 10:00 Mark M Medical Record Number: [id number] Patient Account Number: [id number] Date of Birth/Sex: Treating RN: May 29, 1961 (62 y.o. M) Primary Care Provider: [facility] Other Clinician: Referring Provider: Treating Provider/Extender: Rufus Sharper [facility] in Treatment:  21 Subjective History of Present Illness (HPI) 05/18/2023 Mr. Aiden Helzer is Mark 63 year old male with Mark past medical history of multiple sclerosis and neurogenic bladder with chronic indwelling Foley catheter that presents to  the clinic for Mark 1 year history of nonhealing ulcers to the bilateral ischium As well as upper back. He has an air mattress. He spends about 50% of the time in the bed and 50% of the time in the wheelchair. He does not have Mark Roho cushion. He has been using Medihoney and Santyl  to the wound beds. He currently denies systemic signs of infection. Of note he was hospitalized on 2 occasions in January 2024 for trigeminal neuralgia. He has lost Mark significant amount of weight due to this and his oral intake has declined. He was evaluated for PEG tube but was not Mark candidate. 8/19; patient presents for follow-up. Patient has home health. He has been using Dakin's wet-to-dry dressings to the ischial wounds and Santyl  to the back wound. Patient was recently hospitalized for UTI and received IV Zosyn  for 3 days followed by fosfomycin. He completed 6 days of antibiotics. He currently denies systemic signs of infection. He also had Mark portable pelvic x-ray that did not show any obvious erosive changes. Wife is presentAnd she states she is trying to aggressively offload the wound beds by repositioning him on his sides. Unfortunately he has developed two wounds 1 to the right buttocks and 1 to the midline back. 9/5; patient presents for follow-up. Wife is present. She has been using Dakin's wet-to-dry dressings to the ischial wounds and Santyl  to the back wounds and right buttocks wound. Overall wounds have improved in appearance and size since last clinic visit. 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 He is here with his wife. He has pressure ulcers over the left and right ischial tuberosities both  stage IV wounds and more superficial areas on his back. His wife tells me she gets him up in the wheelchair at about 7:00 in the morning and he is there till 5:00 at night when they put him to bed. He is apparently eating and drinking well. We have been using Dakin's wet to dry did the stage IV wounds on his ischial tuberosities and Santyl  with Hydrofera Blue to Mark small area on his back at roughly T4 10/3 patient with advanced multiple sclerosis. He has stage IV wounds over both ischial tuberosities and more superficial area on his back. He has been using Dakin's wet-to-dry on the ischial tuberosity wounds Santyl  with Hydrofera Blue to the wound on his back 10/7; patient with advanced multiple sclerosis he has stage IV wounds over [name] at the ischial tuberosities more superficial areas on his spinal process. He still has an open area on the back. He has been using Dakin's wet-to-dry on the ischial tuberosity wounds Santyl  with Hydrofera Blue to the wound on his back that still remains 10/31; advanced multiple sclerosis with bilateral stage IV wounds over the ischial tuberosities bilaterally. He has 2 areas on his back 1 was Mark new reopening this time. Been using Hydrofera Blue and Santyl  to the areas on his back and Dakin's wet-to-dry to the ischial tuberosity wounds. 12/2; stage IV wounds over both ischial tuberosities. There is still exposed bone but otherwise generally healthy granulation. No major changes in wound dimensions. He has 2 areas on his back 1 has closed over distally the more proximal 1 appears better. Based on the length of time he is in Mark wheelchair I would view the ischial tuberosity wounds is palliative. [See my note of 07/01/2023]. He uses Dakin's wet to dry on these  areas that have Mark large wound area. 10/14/2023. Patient arrives in clinic. The 2 smaller areas on his thoracic spine are both closed. He has the 2 areas on his bilateral ischial tuberosities. He has been using Dakin's  wet-to-dry on these areas and although the granulation looks healthy there is still areas of exposed bone. In discussing things with his wife I think she has the balance the amount of time he is in bed offloading his wounds with functional decline when he spends too much time in bed and quality of life issues. Berdan, Duquan F ([id number]) [id number] Page 5 of 6 Objective Constitutional Sitting or standing Blood Pressure is within target range for patient.. Pulse regular and within target range for patient.SABRA Respirations regular, non-labored and within target range.. Temperature is normal and within the target range for the patient.SABRA Appears in no distress. Vitals Time Taken: 9:44 AM, Height: 70 in, Weight: 176 lbs, BMI: 25.3, Temperature: 97.9 F, Pulse: 57 bpm, Respiratory Rate: 16 breaths/min, Blood Pressure: 109/73 mmHg. General Notes: Wound exam; the areas on his upper thoracic spine are all healed. He still has Mark stage IV wounds or over his ischial tuberosities both of which are large and have some degree of exposed bone. Fortunately no evidence of surrounding soft tissue infection. Integumentary (Hair, Skin) Wound #2 status is Open. Original cause of wound was Gradually Appeared. The date acquired was: 05/14/2022. The wound has been in treatment 21 weeks. The wound is located on the Medial Back. The wound measures 0cm length x 0cm width x 0cm depth; 0cm^2 area and 0cm^3 volume. There is no tunneling or undermining noted. There is Mark medium amount of serosanguineous drainage noted. The wound margin is distinct with the outline attached to the wound base. There is no granulation within the wound bed. There is no necrotic tissue within the wound bed. The periwound skin appearance exhibited: Scarring. The periwound skin appearance did not exhibit: Callus, Crepitus, Excoriation, Induration, Rash, Dry/Scaly, Maceration, Atrophie Blanche, Cyanosis, Ecchymosis, Hemosiderin  Staining, Mottled, Pallor, Rubor, Erythema. Periwound temperature was noted as No Abnormality. Wound #3 status is Open. Original cause of wound was Gradually Appeared. The date acquired was: 05/14/2022. The wound has been in treatment 21 weeks. The wound is located on the Left Gluteus. The wound measures 5.5cm length x 3.3cm width x 1.7cm depth; 14.255cm^2 area and 24.233cm^3 volume. There is bone, muscle, and Fat Layer (Subcutaneous Tissue) exposed. There is no undermining noted, however, there is tunneling at 12:00 with Mark maximum distance of 3.8cm. There is Mark medium amount of serosanguineous drainage noted. The wound margin is distinct with the outline attached to the wound base. There is large (67-100%) red, pink granulation within the wound bed. There is Mark small (1-33%) amount of necrotic tissue within the wound bed including Adherent Slough. The periwound skin appearance exhibited: Scarring, Hemosiderin Staining. The periwound skin appearance did not exhibit: Dry/Scaly, Maceration. Periwound temperature was noted as No Abnormality. Wound #4 status is Open. Original cause of wound was Pressure Injury. The date acquired was: 05/14/2022. The wound has been in treatment 21 weeks. The wound is located on the Right Gluteal fold. The wound measures 6.3cm length x 4.2cm width x 1.8cm depth; 20.782cm^2 area and 37.407cm^3 volume. There is muscle and Fat Layer (Subcutaneous Tissue) exposed. There is no undermining noted, however, there is tunneling at 7:00 with Mark maximum distance of 1.8cm. There is Mark medium amount of serous drainage noted. The wound margin is distinct with the outline attached to  the wound base. There is medium (34-66%) red, pink granulation within the wound bed. There is Mark small (1-33%) amount of necrotic tissue within the wound bed including Adherent Slough. The periwound skin appearance had no abnormalities noted for color. The periwound skin appearance exhibited:  Maceration. Assessment Active Problems ICD-10 Pressure ulcer of right buttock, stage 4 Pressure ulcer of left buttock, stage 4 Moderate protein-calorie malnutrition Multiple sclerosis Neuromuscular dysfunction of bladder, unspecified Plan Follow-up Appointments: Return appointment in 1 month. - ****HOYER ***extra time 60 minutes Dr. Rosan Riggs office to schedule) Anesthetic: (In clinic) Topical Lidocaine  4% applied to wound bed Bathing/ Shower/ Hygiene: Other Bathing/Shower/Hygiene Orders/Instructions: - May bathe with soap and water Off-Loading: Low air-loss mattress (Group 2) Turn and reposition every 2 hours Other: - only an hour up to eat and back to bed. Turn every 2 hours. Best to stay in bed turn side to side and back one postition for max of 2 hrs Additional Orders / Instructions: Follow Nutritious Diet - increase protein Other: - protect the back with gauze or pads 1-3 weeks. Juven Shake 1-2 times daily. Home Health: No change in wound care orders this week; continue Home Health for wound care. May utilize formulary equivalent dressing for wound treatment orders unless otherwise specified. - Dakin's solution wet to dry to wounds - Left Gluteus and Right Gluteal Fold Other Home Health Orders/Instructions: - Please see the patient at least once Mark week OR please see the patient's spouse regarding wound supplies ( to order supplies for the patient) [facility] WOUND #3: - Gluteus Wound Laterality: Left Cleanser: Vashe 5.8 (oz) (Home Health) (Generic) 1 x Per Day/30 Days Discharge Instructions: Cleanse the wound with Vashe prior to applying Mark clean dressing using gauze sponges, not tissue or cotton balls. Prim Dressing: Dakin's Solution 0.25%, 16 (oz) (Home Health) 1 x Per Day/30 Days bobette IDE, Franck F ([id number]) [id number] Page 6 of 6 Discharge Instructions: Moisten gauze with Dakin's solution Secondary Dressing: Zetuvit Plus  Silicone Border Dressing 7x7(in/in) (Home Health) 1 x Per Day/30 Days Discharge Instructions: Apply silicone border over primary dressing as directed. WOUND #4: - Gluteal fold Wound Laterality: Right Cleanser: Vashe 5.8 (oz) (Home Health) (Generic) 1 x Per Day/30 Days Discharge Instructions: Cleanse the wound with Vashe prior to applying Mark clean dressing using gauze sponges, not tissue or cotton balls. Prim Dressing: Dakin's Solution 0.25%, 16 (oz) (Home Health) 1 x Per Day/30 Days ary Discharge Instructions: Moisten gauze with Dakin's solution Secondary Dressing: Zetuvit Plus Silicone Border Dressing 7x7(in/in) (Home Health) 1 x Per Day/30 Days Discharge Instructions: Apply silicone border over primary dressing as directed. 1. I did not change the primary dressing which is Dakin's wet to dry. Truthfully I could not think of an obvious alternative here given the overall wound volume. 2. In discussing things with his wife she makes Mark value assessment about the amount of time he spends in bed off the wounds. She finds his function deteriorates rapidly if he is immobilized for too much time. 3. The amount of time he still spends in the wheelchair from Mark pure wound point of view is probably going to preclude any chance of healing these wounds. They do have Mark alternating air loss mattress Electronic Signature(s) Signed: 10/14/2023 4:03:36 PM By: Rufus Sharper MD Entered By: Rufus Sharper on 10/14/2023 10:28:06 -------------------------------------------------------------------------------- SuperBill Details Patient Name: Date of Service: Mark DELENA [name], Mark DA M F. 10/14/2023 Medical Record Number: [id number] Patient Account Number: [id number] Date of Birth/Sex: Treating  RN: 1961-09-14 (62 y.o. NETTY Drury Nestle Primary Care Provider: [facility] Other Clinician: Referring Provider: Treating Provider/Extender: Rufus Sharper [facility] in Treatment: 21 Diagnosis  Coding ICD-10 Codes Code Description L89.314 Pressure ulcer of right buttock, stage 4 L89.324 Pressure ulcer of left buttock, stage 4 L89.312 Pressure ulcer of right buttock, stage 2 L89.103 Pressure ulcer of unspecified part of back, stage 3 L89.102 Pressure ulcer of unspecified part of back, stage 2 E44.0 Moderate protein-calorie malnutrition G35 Multiple sclerosis N31.9 Neuromuscular dysfunction of bladder, unspecified Facility Procedures : CPT4 Code: [id number] Description: 00785 - WOUND CARE VISIT-LEV 4 EST PT Modifier: Quantity: 1 Physician Procedures : CPT4 Code Description Modifier [id number] - WC PHYS LEVEL 3 - EST PT ICD-10 Diagnosis Description L89.314 Pressure ulcer of right buttock, stage 4 L89.324 Pressure ulcer of left buttock, stage 4 G35 Multiple sclerosis Quantity: 1 Electronic Signature(s) Signed: 10/14/2023 4:03:36 PM By: Rufus Sharper MD Entered By: Rufus Sharper on 10/14/2023 89:71:71

## 2023-10-29 ENCOUNTER — Encounter: Payer: Self-pay | Admitting: Neurology

## 2023-11-01 ENCOUNTER — Other Ambulatory Visit: Payer: Self-pay | Admitting: *Deleted

## 2023-11-01 MED ORDER — OXYCODONE-ACETAMINOPHEN 5-325 MG PO TABS: 1.0000 | ORAL_TABLET | Freq: Two times a day (BID) | ORAL | 0 refills | Status: AC | PRN

## 2023-11-01 NOTE — Telephone Encounter (Signed)
Mychart sent:  "Hi Dr Epimenio Foot, Regarding [name] [phone] Can you please fax in a prescription for  Oxycodone HCL 5/Acetaminophen 325MG  [facility]? [facility] lasts prescription was filled on April 28, 2023.  He only takes these when his Trigeminal neuralgia is flaring to get it under control.  We have 7 tablets left from his previous prescription.  Thank you, Dannette Silversmith "   Last refilled 03/29/23 #60.

## 2023-11-02 ENCOUNTER — Telehealth: Payer: Self-pay | Admitting: Neurology

## 2023-11-02 NOTE — Telephone Encounter (Signed)
See phone note from 11/02/2023

## 2023-11-02 NOTE — Telephone Encounter (Addendum)
Called wife back. She reached out to Harley Alto who was on previous approval from 2023. She is told we need to send request that he needs to see Dr. Epimenio Foot for continued care/infusions. I relayed we had [facility] form and this was completed. I faxed to [phone]. Received fax confirmation. Marked urgent. Asked Berkley Harvey letter be faxed to [phone].

## 2023-11-02 NOTE — Telephone Encounter (Signed)
Wife reports that [facility]  needs a letter that pt needs continuous care here (Infusion therapy for 01-23) Wife states Holly from Infusion informed her a letter is needed in order for pt to have appointment on the 23rd, please call wife to discuss.

## 2023-11-03 NOTE — Telephone Encounter (Signed)
Called and LVM for [place] [phone], ext [phone]. Wanting to confirm they received paperwork faxed and see if things can be expedited since pt scheduled for infusion tomorrow. Asked they call before 5pm today.

## 2023-11-04 NOTE — Telephone Encounter (Signed)
Spoke w/ Bonita Quin at [facility] pharmacy (Her contact if we need to f/u is [phone] ext [phone]). She handles medication auth's. Her position is fairly new to help make sure meds get approved from outside offices. She instructed me to call [facility] office at [phone] ext [phone] to verify they received form we faxed on 11/02/23. I called this number and spoke w/ Tammy. They have not received form yet but can take 3-4 days to receive. They will treat as urgent review once they receive.   I spoke w/ Holly/[facility]fusion and she will call wife and provide this information.

## 2023-11-04 NOTE — Telephone Encounter (Signed)
Kathryne Sharper  [facility] returned phone call, transferred call to POD 1

## 2023-11-18 ENCOUNTER — Encounter (HOSPITAL_BASED_OUTPATIENT_CLINIC_OR_DEPARTMENT_OTHER): Payer: No Typology Code available for payment source | Attending: Internal Medicine | Admitting: Internal Medicine

## 2023-11-18 DIAGNOSIS — G35 Multiple sclerosis: Secondary | ICD-10-CM | POA: Diagnosis not present

## 2023-11-18 DIAGNOSIS — L89314 Pressure ulcer of right buttock, stage 4: Secondary | ICD-10-CM | POA: Diagnosis not present

## 2023-11-18 DIAGNOSIS — L89324 Pressure ulcer of left buttock, stage 4: Secondary | ICD-10-CM | POA: Diagnosis not present

## 2023-11-18 DIAGNOSIS — N319 Neuromuscular dysfunction of bladder, unspecified: Secondary | ICD-10-CM | POA: Diagnosis not present

## 2023-11-18 DIAGNOSIS — E440 Moderate protein-calorie malnutrition: Secondary | ICD-10-CM | POA: Diagnosis not present

## 2023-12-01 ENCOUNTER — Encounter: Payer: Self-pay | Admitting: Neurology

## 2023-12-01 ENCOUNTER — Other Ambulatory Visit: Payer: Self-pay | Admitting: Neurology

## 2023-12-01 MED ORDER — LAMOTRIGINE 100 MG PO TABS: ORAL_TABLET | ORAL | 3 refills | Status: AC

## 2023-12-15 ENCOUNTER — Other Ambulatory Visit: Payer: Self-pay | Admitting: Neurology

## 2023-12-16 ENCOUNTER — Encounter (HOSPITAL_BASED_OUTPATIENT_CLINIC_OR_DEPARTMENT_OTHER): Payer: No Typology Code available for payment source | Attending: Internal Medicine | Admitting: Internal Medicine

## 2023-12-16 DIAGNOSIS — E440 Moderate protein-calorie malnutrition: Secondary | ICD-10-CM | POA: Diagnosis not present

## 2023-12-16 DIAGNOSIS — G35 Multiple sclerosis: Secondary | ICD-10-CM | POA: Diagnosis not present

## 2023-12-16 DIAGNOSIS — L89314 Pressure ulcer of right buttock, stage 4: Secondary | ICD-10-CM | POA: Diagnosis not present

## 2023-12-16 DIAGNOSIS — L89324 Pressure ulcer of left buttock, stage 4: Secondary | ICD-10-CM | POA: Diagnosis not present

## 2023-12-16 DIAGNOSIS — N319 Neuromuscular dysfunction of bladder, unspecified: Secondary | ICD-10-CM | POA: Insufficient documentation

## 2023-12-28 ENCOUNTER — Ambulatory Visit (INDEPENDENT_AMBULATORY_CARE_PROVIDER_SITE_OTHER): Payer: No Typology Code available for payment source | Admitting: Neurology

## 2023-12-28 ENCOUNTER — Encounter: Payer: Self-pay | Admitting: Neurology

## 2023-12-28 VITALS — BP 102/69 | HR 64 | Resp 15 | Ht 70.0 in

## 2023-12-28 DIAGNOSIS — G35 Multiple sclerosis: Secondary | ICD-10-CM

## 2023-12-28 DIAGNOSIS — N319 Neuromuscular dysfunction of bladder, unspecified: Secondary | ICD-10-CM | POA: Diagnosis not present

## 2023-12-28 DIAGNOSIS — G8250 Quadriplegia, unspecified: Secondary | ICD-10-CM | POA: Diagnosis not present

## 2023-12-28 DIAGNOSIS — Z79899 Other long term (current) drug therapy: Secondary | ICD-10-CM

## 2023-12-28 DIAGNOSIS — N3000 Acute cystitis without hematuria: Secondary | ICD-10-CM

## 2023-12-28 MED ORDER — MODAFINIL 200 MG PO TABS: 100.0000 mg | ORAL_TABLET | Freq: Every morning | ORAL | 1 refills | Status: DC

## 2023-12-28 NOTE — Progress Notes (Signed)
 [facility]  PATIENT: [name] DOB: 08/26/1961  REFERRING DOCTOR OR PCP: [name], MD SOURCE: Patient, notes from Dr. Anne Hahn, imaging and lab reports, MRI images personally reviewed.  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 man with a relapsing form of secondary progressive MS.  Update 12/28/2023:  He is on Ocrevus - last infusion July 2024.  We discussed that it is strongest MS medication but that it could increased infection risk.   Would need to consider stopping it wounds worsen   Trigeminal neuralgia:   Pain is much better.   MRI of the brain with added attention to the trigeminal nerves showed the right superior cerebellar artery effaces the cisternal segment of the right trigeminal nerve without causing definite compression.  There is no dorsal root entry zone or transitional zone compression.  There are multiple brainstem lesions but none near the dorsal root entry zone of the trigeminal nerve.   Neurosurgery dd not think he was a good candidate for decompression.  Currently he takes lamotrigine 100 mg po bid and 200 mg po qHS and pregabalin 150 mg po tid.     If pain is more severe he can go up to 200 mg po tid and Lyrica 150 mg up to 4 a day.     He sometimes takes an oxycodone at night.    He saw [name] and had a block followed by an aboation for the right TN.  Unfortunately it did not help.   Oxcarbazepine had not helped.   In the hospital fosphenytoin infusion may have given short term benefit.  When pain is severe he does not eat and has ost weight.   Due to anatomy, he is not a good candidate  for a simple feeding tube and recovery from open surgery might be slow.     He is wheelchair bound.    He last used a cane in 2009 and then [place] crutches..   He has needed a lift to transfer since a long hospital stay for a pseudomonas UTI (urosepsis) May 2021.     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   His spasticity is bothersome but baclofen makes him sleepy.  Therefore he usually just takes 1 a day..  Tizanidine caused hallucinations.  He has numbness waist down.  He has bilateral INO.    He notes more issues with word finding.   He has dysphagia .  Due to anatomy, a Feeding tube would be difficult without open surgery so they have opted to wait.    He has noted more left arm weakness/spasticity.   Baclofen makes him sleepy so he just takes 1 as needed.   They have some exercise equipment from OT but does not use it    He is chair/bed bound and does have a couple sores under the buttock.  He sees Wound care and is improving.   He has had skin breakdown on several occasions.  He is currently seeing wound care.  His wife packs the current wound as directed by wound care.     MS History: He was diagnosed in 2007  after presenting with gradual worsening of balance.  Around that time, he had hurt his knee but even when knee improved, he flet off balance and would hold the wall or furniture at time.   In retrospect, he had some symptoms in 2001.  At that time an MRI was reportedly normal.  That year, he had fairly sudden onset of numbness below his waist that gradually improved over the next few weeks.     He was in a trial comparing Rebif and Betaseron n 2007 and was initially on Rebif and then changed to Betaseron.   He had injection site reactions.   He started taking GIlenya in 2011.    He switched to Ocrevus in 2019.    He has a strong FH of MS with two sisters, one niece.   IMAGING: MRI of the face 11/09/2022 showed no vascular loop compressing the right trigeminal  nerve though the superior cerebellar artery was adjacent to the cisternal segment of the nerve.  There were multiple brainstem lesions but none near the dorsal root entry zone on the right.  MRI of the brain and cervical spine 07/07/2022 was unchanged compared to 2020.  MRI of the head 07/11/2019 shows T2 hyperintense foci within the left posterior medulla, cerebellar hemispheres, lower left central pons, right midbrain, left thalamus, and in the periventricular, juxtacortical and deep white matter of both hemispheres.  No definite changes compared to the MRI from 08/01/2014.  MRI cervical spine 07/11/2019 shows foci seen on the previous MRI as well as a focus at T2-T3.  MRI of the brain 08/01/2014 shows T2 hyperintense foci within the left posterior medulla, cerebellar hemispheres, lower left central pons, right midbrain, left thalamus, and in the periventricular, juxtacortical and deep white matter of both hemispheres.  No definite changes compared to the MRI from 08/01/2014.  The infratentorial lesions were not clearly present on the previous MRI from 2007 and there has also been some progression in the hemispheres.Marland Kitchen  MRI of the brain 01/11/2006 shows T2/FLAIR hyperintense foci in the hemispheres consistent with MS  MRI of the cervical spine 01/10/2006 shows T2 hyperintense foci within the spinal cord Adjacent to C5, centrally towards the left adjacent to T1 and at T2-T3  REVIEW OF SYSTEMS: Constitutional: No fevers, chills, sweats, or change in appetite Eyes: No visual changes, double vision, eye pain Ear, nose and throat: No hearing loss, ear pain, nasal congestion, sore throat Cardiovascular: No chest pain, palpitations Respiratory:  No shortness of breath at rest or with exertion.   No wheezes GastrointestinaI: No nausea, vomiting, diarrhea, abdominal pain, fecal incontinence Genitourinary:  No dysuria, urinary retention or frequency.  No nocturia. Musculoskeletal:  No neck pain, back  pain Integumentary: No rash, pruritus, skin lesions Neurological: as above Psychiatric: No depression at this time.  No anxiety Endocrine: No palpitations, diaphoresis, change in appetite, change in weigh or increased thirst Hematologic/Lymphatic:  No anemia, purpura, petechiae. Allergic/Immunologic: No itchy/runny eyes, nasal congestion, recent allergic reactions, rashes  ALLERGIES: Allergies  Allergen Reactions   Gadopiclenol Nausea And Vomiting   Iodinated Contrast Media Nausea And Vomiting   Zosyn [Piperacillin-Tazobactam In Dex]     Not allergic per family     HOME MEDICATIONS:  Current Outpatient Medications:    acetaminophen (TYLENOL) 500 MG tablet, Take 1,000 mg by mouth in the morning, at noon, and at bedtime., Disp: , Rfl:    albuterol (VENTOLIN HFA) 108 (90 Base) MCG/ACT inhaler, TAKE 2 PUFFS BY MOUTH EVERY 6 HOURS AS NEEDED,  Disp: 1 each, Rfl: 3   ascorbic acid (VITAMIN C) 500 MG tablet, Take 500 mg by mouth 2 (two) times daily., Disp: , Rfl:    baclofen (LIORESAL) 10 MG tablet, Take 10 mg by mouth daily as needed for muscle spasms., Disp: , Rfl:    Carboxymethylcellulose Sodium (THERATEARS OP), Place 2-3 drops into both eyes as needed (dry eye)., Disp: , Rfl:    collagenase (SANTYL) 250 UNIT/GM ointment, Apply 1 Application topically daily., Disp: 90 g, Rfl: 3   cyanocobalamin 1000 MCG tablet, Take 1 tablet (1,000 mcg total) by mouth daily., Disp: , Rfl:    escitalopram (LEXAPRO) 10 MG tablet, Take 1 tablet (10 mg total) by mouth daily., Disp: 90 tablet, Rfl: 3   ferrous sulfate 325 (65 FE) MG EC tablet, Take 1 tablet (325 mg total) by mouth 2 (two) times daily., Disp: 180 tablet, Rfl: 1   ibuprofen (ADVIL) 200 MG tablet, Take 600 mg by mouth as needed for headache or moderate pain., Disp: , Rfl:    lamoTRIgine (LAMICTAL) 100 MG tablet, Take 100 mg by mouth in the morning, 100 mg at lunch and 200 mg by mouth at bedtime., Disp: 360 tablet, Rfl: 3   leptospermum manuka honey  (MEDIHONEY) PSTE paste, Apply 1 Application topically daily., Disp: 15 mL, Rfl: 0   ocrelizumab 600 mg in sodium chloride 0.9 % 500 mL, Inject 600 mg into the vein every 6 (six) months. , Disp: , Rfl:    oxyCODONE-acetaminophen (PERCOCET) 5-325 MG tablet, Take 1 tablet by mouth every 12 (twelve) hours as needed for severe pain (pain score 7-10)., Disp: 60 tablet, Rfl: 0   potassium chloride SA (KLOR-CON M) 20 MEQ tablet, Take 2 tablets (40 mEq total) by mouth daily., Disp: 60 tablet, Rfl: 0   pregabalin (LYRICA) 150 MG capsule, Take 1 capsule (150 mg total) by mouth 3 (three) times daily., Disp: 90 capsule, Rfl: 5   zinc gluconate 50 MG tablet, Take 50 mg by mouth at bedtime., Disp: , Rfl:    modafinil (PROVIGIL) 200 MG tablet, Take 0.5 tablets (100 mg total) by mouth in the morning., Disp: 90 tablet, Rfl: 1  PAST MEDICAL HISTORY: Past Medical History:  Diagnosis Date   Abnormality of gait 11/21/2015   Asthma    childhood asthma   Classic migraine    Depression    Dysphagia    Hay fever    Headache syndrome 12/22/2018   MS (multiple sclerosis) (HCC)    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   Trigeminal neuralgia of right side of face     PAST SURGICAL HISTORY: Past Surgical History:  Procedure Laterality Date   eye surgeries     x 2; bilateral 74 and 76   EYE SURGERY      FAMILY HISTORY: Family History  Problem Relation Age of Onset   Cancer Father    Multiple sclerosis Sister    Seizures Maternal Uncle    Parkinsonism Maternal Uncle    Multiple sclerosis Sister    Multiple sclerosis Paternal Uncle    Multiple sclerosis Other    Lung cancer Other        parent   Uterine cancer Other        other    SOCIAL HISTORY:  Social History   Socioeconomic History   Marital status: Married    Spouse name: joy   Number of children: 2   Years of education: college   Highest education level: Not on file  Occupational History  Occupation: disabled  Tobacco Use   Smoking  status: Former    Current packs/day: 0.50    Types: Cigarettes    Passive exposure: Past   Smokeless tobacco: Never  Vaping Use   Vaping status: Not on file  Substance and Sexual Activity   Alcohol use: No    Comment: rare    Drug use: Yes    Types: Oxycodone   Sexual activity: Not Currently    Comment: disease progress  Other Topics Concern   Not on file  Social History Narrative   Lives at home, married   Unable to use right hand   Patient drinks 1 cup caffeine daily.   Social Drivers of Corporate investment banker Strain: Not on file  Food Insecurity: No Food Insecurity (05/21/2023)   Hunger Vital Sign    Worried About Running Out of Food in the Last Year: Never true    Ran Out of Food in the Last Year: Never true  Transportation Needs: No Transportation Needs (05/21/2023)   PRAPARE - Administrator, Civil Service (Medical): No    Lack of Transportation (Non-Medical): No  Physical Activity: Not on file  Stress: Not on file  Social Connections: Unknown (07/07/2022)   Received from [facility]   Social Network    Social Network: Not on file  Intimate Partner Violence: Not At Risk (05/21/2023)   Humiliation, Afraid, Rape, and Kick questionnaire    Fear of Current or Ex-Partner: No    Emotionally Abused: No    Physically Abused: No    Sexually Abused: No     PHYSICAL EXAM  Vitals:   12/28/23 1129  BP: 102/69  Pulse: 64  Resp: 15  Height: 5\' 10"  (1.778 m)     Body mass index is 24.36 kg/m.   General: The patient is well-developed and well-nourished and in no acute distress.  He is in a wheelchair.  HEENT:  Head is [place]/AT.  Sclera are anicteric.    Skin: Extremities are without rash or  edema.  Musculoskeletal:  Back is nontender  Neurologic Exam  Mental status: The patient is alert and oriented x 3 at the time of the examination. The patient has apparent normal recent and remote memory, with an apparently normal attention span  and concentration ability.   Speech is soft.  Cranial nerves: Extraocular movements show bilateral INO right greater than left and also some nystagmus on upgaze.  No nystagmus with primary gaze.  Reduced visual acuity OD.  Facial symmetry is present.  Facial strength appears symmetric.  Facial sensation seem more symmetric today.  Trapezius and sternocleidomastoid strength is normal.  Mild dysarthria is noted.   No obvious hearing deficits are noted.  Motor:  Muscle bulk is normal.   Tone is increased legs > arms; right > left . Strength is  2/5 in left arm grip but 2-/5 in the shoulder, 1/5 right arm, 0/5 in legs.   Sensory: He is unable to feel touch or vibration in the legs.  Gait and station: Wheelchair bound  Reflexes: Deep tendon reflexes are symmetric and increased in legs with sustained ankle clonus.   Plantar responses are extensor .    DIAGNOSTIC DATA (LABS, IMAGING, TESTING) - I reviewed patient records, labs, notes, testing and imaging myself where available.  Lab Results  Component Value Date   WBC 13.9 (H) 06/15/2023   HGB 10.0 (L) 06/15/2023   HCT 32.5 (L) 06/15/2023   MCV 86  06/15/2023   PLT 359 06/15/2023      Component Value Date/Time   NA 141 06/15/2023 1526   K 4.0 06/15/2023 1526   CL 99 06/15/2023 1526   CO2 27 06/15/2023 1526   GLUCOSE 81 06/15/2023 1526   GLUCOSE 119 (H) 05/24/2023 0203   BUN 15 06/15/2023 1526   CREATININE 0.60 (L) 06/15/2023 1526   CALCIUM 9.2 06/15/2023 1526   PROT 5.8 (L) 06/15/2023 1526   ALBUMIN 3.9 06/15/2023 1526   AST 7 06/15/2023 1526   ALT 6 06/15/2023 1526   ALKPHOS 144 (H) 06/15/2023 1526   BILITOT 0.3 06/15/2023 1526   GFRNONAA >60 05/24/2023 0203   GFRAA 84 07/29/2020 1429   Lab Results  Component Value Date   CHOL 234 (H) 08/14/2015   HDL 47.80 08/14/2015   LDLCALC 163 (H) 08/14/2015   LDLDIRECT 192.1 08/04/2012   TRIG 116.0 08/14/2015   CHOLHDL 5 08/14/2015   Lab Results  Component Value Date   HGBA1C  5.4 08/14/2015   Lab Results  Component Value Date   VITAMINB12 603 06/15/2023   Lab Results  Component Value Date   TSH 0.746 08/19/2022       ASSESSMENT AND PLAN  MS (multiple sclerosis) (HCC) - Plan: Urinalysis, Culture, Urine, IgG, IgA, IgM, CD20 B Cells, Culture, Urine, Urinalysis  Spastic quadriparesis (HCC)  Neurogenic bladder - Plan: Urinalysis, Culture, Urine, Culture, Urine, Urinalysis  Acute cystitis without hematuria - Plan: Culture, Urine, Culture, Urine  High risk medication use - Plan: IgG, IgA, IgM, CD20 B Cells   He has right trigeminal neuralgia that is currently well-controlled on lamotrigine, baclofen and Lyrica.  Continue lamotrigine 100/100/200 but can increase to 200 mg po tid   continue baclofen and Lyrica 150 mg twice a day but can go up to 600 mg/day Ocrevus 600 mg q 6 months,  check labs.  We discussed that Ocrevus is excellent at reducing relapses and new lesions on MRI but that secondary progressive clinical changes and MS are not helped as much.  I did discuss that there are some medications in clinical trials and hopefully 1 more then will get approved.  If so, I would consider switching him from Ocrevus to that medication (as an example tolebrutinib, other BTKi or frexalimab) Continue iron and B12.    Continue Wound care - sees again later in week They will return to see me in 6 months or sooner if there are new or worsening neurologic symptoms.  42-minute office visit with the majority of the time spent face-to-face for history and physical, discussion/counseling and decision-making.  Additional time with record review and documentation.  This visit is part of a comprehensive longitudinal care medical relationship regarding the patients primary diagnosis of MS and related concerns.   Remington Skalsky A. Epimenio Foot, MD, [facility] 12/28/2023, 3:00 PM Certified in Neurology, Clinical Neurophysiology, Sleep Medicine and Neuroimaging  [facility] [address] [place], [place] [address] [phone]

## 2023-12-29 ENCOUNTER — Encounter: Payer: Self-pay | Admitting: Neurology

## 2023-12-30 ENCOUNTER — Encounter (HOSPITAL_COMMUNITY): Payer: Self-pay | Admitting: Student

## 2023-12-30 ENCOUNTER — Inpatient Hospital Stay (HOSPITAL_COMMUNITY)

## 2023-12-30 ENCOUNTER — Inpatient Hospital Stay (HOSPITAL_COMMUNITY)
Admission: EM | Admit: 2023-12-30 | Discharge: 2024-01-03 | DRG: 698 | Disposition: A | Discharge status: Home / Self Care | Attending: Family Medicine | Admitting: Family Medicine

## 2023-12-30 ENCOUNTER — Emergency Department (HOSPITAL_COMMUNITY)

## 2023-12-30 ENCOUNTER — Other Ambulatory Visit: Payer: Self-pay

## 2023-12-30 DIAGNOSIS — Z888 Allergy status to other drugs, medicaments and biological substances status: Secondary | ICD-10-CM

## 2023-12-30 DIAGNOSIS — G35 Multiple sclerosis: Secondary | ICD-10-CM | POA: Diagnosis present

## 2023-12-30 DIAGNOSIS — L89323 Pressure ulcer of left buttock, stage 3: Secondary | ICD-10-CM | POA: Diagnosis present

## 2023-12-30 DIAGNOSIS — G500 Trigeminal neuralgia: Secondary | ICD-10-CM | POA: Diagnosis present

## 2023-12-30 DIAGNOSIS — D84821 Immunodeficiency due to drugs: Secondary | ICD-10-CM | POA: Diagnosis present

## 2023-12-30 DIAGNOSIS — R531 Weakness: Secondary | ICD-10-CM | POA: Diagnosis not present

## 2023-12-30 DIAGNOSIS — L8990 Pressure ulcer of unspecified site, unspecified stage: Secondary | ICD-10-CM | POA: Diagnosis not present

## 2023-12-30 DIAGNOSIS — M869 Osteomyelitis, unspecified: Secondary | ICD-10-CM | POA: Diagnosis present

## 2023-12-30 DIAGNOSIS — R6520 Severe sepsis without septic shock: Principal | ICD-10-CM | POA: Diagnosis present

## 2023-12-30 DIAGNOSIS — B961 Klebsiella pneumoniae [K. pneumoniae] as the cause of diseases classified elsewhere: Secondary | ICD-10-CM | POA: Diagnosis present

## 2023-12-30 DIAGNOSIS — L89313 Pressure ulcer of right buttock, stage 3: Secondary | ICD-10-CM | POA: Diagnosis present

## 2023-12-30 DIAGNOSIS — R059 Cough, unspecified: Secondary | ICD-10-CM | POA: Diagnosis present

## 2023-12-30 DIAGNOSIS — B965 Pseudomonas (aeruginosa) (mallei) (pseudomallei) as the cause of diseases classified elsewhere: Secondary | ICD-10-CM | POA: Diagnosis present

## 2023-12-30 DIAGNOSIS — Z1623 Resistance to quinolones and fluoroquinolones: Secondary | ICD-10-CM | POA: Diagnosis present

## 2023-12-30 DIAGNOSIS — N319 Neuromuscular dysfunction of bladder, unspecified: Secondary | ICD-10-CM | POA: Diagnosis present

## 2023-12-30 DIAGNOSIS — A419 Sepsis, unspecified organism: Secondary | ICD-10-CM | POA: Diagnosis present

## 2023-12-30 DIAGNOSIS — Z66 Do not resuscitate: Secondary | ICD-10-CM | POA: Diagnosis present

## 2023-12-30 DIAGNOSIS — F32A Depression, unspecified: Secondary | ICD-10-CM | POA: Diagnosis present

## 2023-12-30 DIAGNOSIS — L8993 Pressure ulcer of unspecified site, stage 3: Secondary | ICD-10-CM | POA: Diagnosis not present

## 2023-12-30 DIAGNOSIS — Z87891 Personal history of nicotine dependence: Secondary | ICD-10-CM

## 2023-12-30 DIAGNOSIS — E876 Hypokalemia: Secondary | ICD-10-CM | POA: Diagnosis present

## 2023-12-30 DIAGNOSIS — Z79899 Other long term (current) drug therapy: Secondary | ICD-10-CM | POA: Diagnosis not present

## 2023-12-30 DIAGNOSIS — Y846 Urinary catheterization as the cause of abnormal reaction of the patient, or of later complication, without mention of misadventure at the time of the procedure: Secondary | ICD-10-CM | POA: Diagnosis present

## 2023-12-30 DIAGNOSIS — Z789 Other specified health status: Secondary | ICD-10-CM

## 2023-12-30 DIAGNOSIS — T83511A Infection and inflammatory reaction due to indwelling urethral catheter, initial encounter: Principal | ICD-10-CM | POA: Diagnosis present

## 2023-12-30 DIAGNOSIS — I959 Hypotension, unspecified: Secondary | ICD-10-CM | POA: Insufficient documentation

## 2023-12-30 DIAGNOSIS — Z7401 Bed confinement status: Secondary | ICD-10-CM | POA: Diagnosis not present

## 2023-12-30 DIAGNOSIS — Z91041 Radiographic dye allergy status: Secondary | ICD-10-CM | POA: Diagnosis not present

## 2023-12-30 DIAGNOSIS — N390 Urinary tract infection, site not specified: Secondary | ICD-10-CM | POA: Diagnosis present

## 2023-12-30 DIAGNOSIS — R197 Diarrhea, unspecified: Secondary | ICD-10-CM | POA: Diagnosis not present

## 2023-12-30 DIAGNOSIS — R000 Tachycardia, unspecified: Secondary | ICD-10-CM | POA: Diagnosis not present

## 2023-12-30 DIAGNOSIS — G9341 Metabolic encephalopathy: Secondary | ICD-10-CM | POA: Diagnosis present

## 2023-12-30 LAB — CBC WITH DIFFERENTIAL/PLATELET
Abs Immature Granulocytes: 0.17 10*3/uL — ABNORMAL HIGH (ref 0.00–0.07)
Basophils Absolute: 0.1 10*3/uL (ref 0.0–0.1)
Basophils Relative: 0 %
Eosinophils Absolute: 0.1 10*3/uL (ref 0.0–0.5)
Eosinophils Relative: 0 %
HCT: 44.3 % (ref 39.0–52.0)
Hemoglobin: 14.1 g/dL (ref 13.0–17.0)
Immature Granulocytes: 1 %
Lymphocytes Relative: 3 %
Lymphs Abs: 0.7 10*3/uL (ref 0.7–4.0)
MCH: 29.6 pg (ref 26.0–34.0)
MCHC: 31.8 g/dL (ref 30.0–36.0)
MCV: 93.1 fL (ref 80.0–100.0)
Monocytes Absolute: 2.5 10*3/uL — ABNORMAL HIGH (ref 0.1–1.0)
Monocytes Relative: 11 %
Neutro Abs: 20.1 10*3/uL — ABNORMAL HIGH (ref 1.7–7.7)
Neutrophils Relative %: 85 %
Platelets: 262 10*3/uL (ref 150–400)
RBC: 4.76 MIL/uL (ref 4.22–5.81)
RDW: 14.6 % (ref 11.5–15.5)
WBC: 23.6 10*3/uL — ABNORMAL HIGH (ref 4.0–10.5)
nRBC: 0 % (ref 0.0–0.2)

## 2023-12-30 LAB — COMPREHENSIVE METABOLIC PANEL
ALT: 11 U/L (ref 0–44)
AST: 15 U/L (ref 15–41)
Albumin: 3.3 g/dL — ABNORMAL LOW (ref 3.5–5.0)
Alkaline Phosphatase: 97 U/L (ref 38–126)
Anion gap: 10 (ref 5–15)
BUN: 17 mg/dL (ref 6–20)
CO2: 26 mmol/L (ref 22–32)
Calcium: 8.7 mg/dL — ABNORMAL LOW (ref 8.9–10.3)
Chloride: 105 mmol/L (ref 98–111)
Creatinine, Ser: 0.9 mg/dL (ref 0.61–1.24)
GFR, Estimated: >60 mL/min (ref >60)
Glucose, Bld: 119 mg/dL — ABNORMAL HIGH (ref 70–99)
Potassium: 3.7 mmol/L (ref 3.5–5.1)
Sodium: 141 mmol/L (ref 135–145)
Total Bilirubin: 1 mg/dL (ref 0.0–1.2)
Total Protein: 6 g/dL — ABNORMAL LOW (ref 6.5–8.1)

## 2023-12-30 LAB — MRSA NEXT GEN BY PCR, NASAL: MRSA by PCR Next Gen: DETECTED — AB

## 2023-12-30 LAB — RESP PANEL BY RT-PCR (RSV, FLU A&B, COVID)  RVPGX2
Influenza A by PCR: NEGATIVE
Influenza B by PCR: NEGATIVE
Resp Syncytial Virus by PCR: NEGATIVE
SARS Coronavirus 2 by RT PCR: NEGATIVE

## 2023-12-30 LAB — URINALYSIS, W/ REFLEX TO CULTURE (INFECTION SUSPECTED)
Bilirubin Urine: NEGATIVE
Glucose, UA: NEGATIVE mg/dL
Ketones, ur: 5 mg/dL — AB
Nitrite: NEGATIVE
Protein, ur: 30 mg/dL — AB
Specific Gravity, Urine: 1.026 (ref 1.005–1.030)
WBC, UA: >50 WBC/hpf (ref 0–5)
pH: 5 (ref 5.0–8.0)

## 2023-12-30 LAB — I-STAT CG4 LACTIC ACID, ED
Lactic Acid, Venous: 0.9 mmol/L (ref 0.5–1.9)
Lactic Acid, Venous: 1.2 mmol/L (ref 0.5–1.9)

## 2023-12-30 LAB — PROTIME-INR
INR: 1.1 (ref 0.8–1.2)
Prothrombin Time: 14.5 s (ref 11.4–15.2)

## 2023-12-30 LAB — CD20 B CELLS
% CD19-B Cells: 0 % — ABNORMAL LOW (ref 4.6–22.1)
% CD20-B Cells: 0 % — ABNORMAL LOW (ref 5.0–22.3)

## 2023-12-30 LAB — IGG, IGA, IGM
IgA/Immunoglobulin A, Serum: 120 mg/dL (ref 90–386)
IgG (Immunoglobin G), Serum: 548 mg/dL — ABNORMAL LOW (ref 603–1613)
IgM (Immunoglobulin M), Srm: 26 mg/dL (ref 20–172)

## 2023-12-30 LAB — APTT: aPTT: 36 s (ref 24–36)

## 2023-12-30 MED ORDER — GADOBUTROL 1 MMOL/ML IV SOLN
7.0000 mL | Freq: Once | INTRAVENOUS | Status: AC | PRN
Start: 2023-12-30 — End: 2023-12-30
  Administered 2023-12-30: 7 mL via INTRAVENOUS

## 2023-12-30 MED ORDER — LAMOTRIGINE 25 MG PO TABS: 100.0000 mg | ORAL_TABLET | Freq: Two times a day (BID) | ORAL | Status: DC

## 2023-12-30 MED ORDER — LAMOTRIGINE 100 MG PO TABS
200.0000 mg | ORAL_TABLET | Freq: Every day | ORAL | Status: DC
  Administered 2023-12-30 – 2024-01-02 (×4): 200 mg via ORAL
  Filled 2023-12-30 (×4): qty 2

## 2023-12-30 MED ORDER — PREGABALIN 25 MG PO CAPS
150.0000 mg | ORAL_CAPSULE | Freq: Three times a day (TID) | ORAL | Status: DC
  Administered 2023-12-30 – 2024-01-03 (×12): 150 mg via ORAL
  Filled 2023-12-30 (×12): qty 2

## 2023-12-30 MED ORDER — LACTATED RINGERS IV BOLUS (SEPSIS)
1000.0000 mL | Freq: Once | INTRAVENOUS | Status: AC
  Administered 2023-12-30: 1000 mL via INTRAVENOUS

## 2023-12-30 MED ORDER — VANCOMYCIN HCL 1500 MG/300ML IV SOLN
1500.0000 mg | Freq: Once | INTRAVENOUS | Status: AC
  Administered 2023-12-30: 1500 mg via INTRAVENOUS
  Filled 2023-12-30: qty 300

## 2023-12-30 MED ORDER — ENOXAPARIN SODIUM 40 MG/0.4ML IJ SOSY
40.0000 mg | PREFILLED_SYRINGE | Freq: Every day | INTRAMUSCULAR | Status: DC
  Administered 2023-12-30 – 2024-01-03 (×5): 40 mg via SUBCUTANEOUS
  Filled 2023-12-30 (×5): qty 0.4

## 2023-12-30 MED ORDER — OXYCODONE-ACETAMINOPHEN 5-325 MG PO TABS
1.0000 | ORAL_TABLET | Freq: Two times a day (BID) | ORAL | Status: DC | PRN
  Administered 2024-01-01 – 2024-01-02 (×2): 1 via ORAL
  Filled 2023-12-30 (×2): qty 1

## 2023-12-30 MED ORDER — DAKINS (1/4 STRENGTH) 0.125 % EX SOLN
Freq: Two times a day (BID) | CUTANEOUS | Status: AC
  Filled 2023-12-30 (×3): qty 473

## 2023-12-30 MED ORDER — SODIUM CHLORIDE 0.9 % IV SOLN: 2.0000 g | INTRAVENOUS | Status: DC

## 2023-12-30 MED ORDER — LACTATED RINGERS IV BOLUS
500.0000 mL | Freq: Once | INTRAVENOUS | Status: AC
  Administered 2023-12-30: 500 mL via INTRAVENOUS

## 2023-12-30 MED ORDER — VANCOMYCIN HCL 750 MG/150ML IV SOLN
750.0000 mg | Freq: Three times a day (TID) | INTRAVENOUS | Status: DC
  Administered 2023-12-30 – 2024-01-01 (×6): 750 mg via INTRAVENOUS
  Filled 2023-12-30 (×7): qty 150

## 2023-12-30 MED ORDER — LAMOTRIGINE 100 MG PO TABS
100.0000 mg | ORAL_TABLET | Freq: Two times a day (BID) | ORAL | Status: DC
  Administered 2023-12-30 – 2024-01-03 (×8): 100 mg via ORAL
  Filled 2023-12-30 (×5): qty 1
  Filled 2023-12-30: qty 4
  Filled 2023-12-30 (×2): qty 1

## 2023-12-30 MED ORDER — LACTATED RINGERS IV SOLN: INTRAVENOUS | Status: AC

## 2023-12-30 MED ORDER — PREGABALIN 25 MG PO CAPS: 150.0000 mg | ORAL_CAPSULE | Freq: Three times a day (TID) | ORAL | Status: DC

## 2023-12-30 MED ORDER — ACETAMINOPHEN 325 MG PO TABS: 650.0000 mg | ORAL_TABLET | Freq: Once | ORAL | Status: AC

## 2023-12-30 MED ORDER — PIPERACILLIN-TAZOBACTAM 3.375 G IVPB
3.3750 g | Freq: Three times a day (TID) | INTRAVENOUS | Status: DC
  Administered 2023-12-30 – 2024-01-02 (×10): 3.375 g via INTRAVENOUS
  Filled 2023-12-30 (×10): qty 50

## 2023-12-30 MED ORDER — SODIUM CHLORIDE 0.9 % IV SOLN
2.0000 g | Freq: Once | INTRAVENOUS | Status: AC
  Administered 2023-12-30: 2 g via INTRAVENOUS
  Filled 2023-12-30: qty 10

## 2023-12-30 MED ORDER — ALBUTEROL SULFATE (2.5 MG/3ML) 0.083% IN NEBU: 3.0000 mL | INHALATION_SOLUTION | RESPIRATORY_TRACT | Status: DC | PRN

## 2023-12-30 MED ORDER — MODAFINIL 100 MG PO TABS
100.0000 mg | ORAL_TABLET | Freq: Every morning | ORAL | Status: DC
  Administered 2023-12-31 – 2024-01-03 (×4): 100 mg via ORAL
  Filled 2023-12-30 (×4): qty 1

## 2023-12-30 MED ORDER — ESCITALOPRAM OXALATE 10 MG PO TABS
10.0000 mg | ORAL_TABLET | Freq: Every day | ORAL | Status: DC
  Administered 2023-12-31 – 2024-01-03 (×4): 10 mg via ORAL
  Filled 2023-12-30 (×4): qty 1

## 2023-12-30 MED ORDER — ACETAMINOPHEN 325 MG PO TABS
ORAL_TABLET | ORAL | Status: AC
  Administered 2023-12-30: 650 mg via ORAL
  Filled 2023-12-30: qty 2

## 2023-12-30 MED ORDER — LAMOTRIGINE 25 MG PO TABS: 200.0000 mg | ORAL_TABLET | Freq: Every day | ORAL | Status: DC

## 2023-12-30 MED ORDER — MEDIHONEY WOUND/BURN DRESSING EX PSTE
1.0000 | PASTE | Freq: Every day | CUTANEOUS | Status: DC
  Administered 2023-12-30: 1 via TOPICAL
  Filled 2023-12-30 (×2): qty 44

## 2023-12-30 MED ORDER — ACETAMINOPHEN 160 MG/5ML PO SOLN
650.0000 mg | Freq: Once | ORAL | Status: DC
  Filled 2023-12-30: qty 20.3

## 2023-12-30 MED ORDER — VANCOMYCIN HCL 750 MG/150ML IV SOLN
750.0000 mg | Freq: Three times a day (TID) | INTRAVENOUS | Status: DC
  Filled 2023-12-30 (×2): qty 150

## 2023-12-30 NOTE — ED Notes (Signed)
 Pt in MRI.

## 2023-12-30 NOTE — Assessment & Plan Note (Addendum)
 Currently stable.  Status post two 1 L boluses.  Currently on 150 mL/h maintenance IV fluids.  No history of cardiovascular disease/heart failure.  Can continue aggressive fluid resuscitation.  Multiple sources: Indwelling urinary catheter, bedsores, unlikely to be aspiration pneumonia with pt on RA breathing comfortably with normal exam.  - Admit to FMTS, attending [place]  - Progressive, Vital signs per floor - Regular thick liquids diet  - PT/SLP to treat - VTE prophylaxis: Lovenox - Fluids: Continue 150 mL/hr LR - Continue antibiotics: S/p aztreonam (3/20), broadening antibiotics to Zosyn and vancomycin - AM CBC/BMP  - Fall precautions - Delirium precautions - Follow urine cultures  - Follow blood cultures

## 2023-12-30 NOTE — Consult Note (Signed)
 WOC Nurse Consult Note: Reason for Consult: Requested to assess bilateral buttock. Wound type: Pressure injury stage 3 on bottom buttock. The wife is doing his dressing changes, follow by a wound care clinic. Pressure Injury POA: Yes Measurement: left buttock - 5 cm x 4 cm x 3 cm Right side -  3 x 4.5 cm x 2 cm. Wound bed: both 100% red. The pt is using Dakin once/twice a day when soiling. Drainage (amount, consistency, odor) moderated amount, no odor. Periwound: 20% of roller edges, 80% healthy tissue. Intact, no discoloration or redness. Dressing procedure/placement/frequency: Apply Dakin twice a day or PRN soiling. Fill the wound bed with moisture gauze. Cover with foam dressing, change daily.  WOC team will not plan to follow further.  Please reconsult if further assistance is needed. Thank-you,  Denyse Amass BSN, RN, [employer], WOC  (Pager: [phone])

## 2023-12-30 NOTE — Evaluation (Signed)
 Clinical/Bedside Swallow Evaluation Patient Details  Name: [name] MRN: [id number] Date of Birth: [birth year]  Today's Date: 12/30/2023 Time: SLP Start Time (ACUTE ONLY): 1238 SLP Stop Time (ACUTE ONLY): 1253 SLP Time Calculation (min) (ACUTE ONLY): 15 min  Past Medical History:  Past Medical History:  Diagnosis Date   Abnormality of gait 11/21/2015   Asthma    childhood asthma   Classic migraine    Depression    Dysphagia    Hay fever    Headache syndrome 12/22/2018   MS (multiple sclerosis) (HCC)    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   Trigeminal neuralgia of right side of face    Past Surgical History:  Past Surgical History:  Procedure Laterality Date   eye surgeries     x 2; bilateral 74 and 76   EYE SURGERY     HPI:  [name] is a 63 yo male presenting to ED 3/20 with AMS and concern for sepsis suspected to be secondary to UTI. CXR shows diffuse interstitial opacity, likely chronic in nature. Previously seen by SLP who recommended pt continue regular diet with thin liquids to allow full range of choices. PMH includes MS, trigeminal neuralgia, chronic indwelling Foley catheter    Assessment / Plan / Recommendation  Clinical Impression  Pt and his wife report better controlled pain from TN with improving oral intake. They state that MD offered him thickened liquids during previous admission, which have been preferable to thin liquids for oral deficits secondary to MS and TN pain rather than concern for aspiration. His wife describes thickening all of his drinks to a consistency between thin and nectar thick liquids (slightly thick) PTA. Observed pt with trials of nectar thick liquids and purees without overt s/s of dysphagia or aspiration. He understandably declined trials of graham crackers and no softer solids were available at the time of the evaluation. Recommend continuing baseline diet of regular solids with nectar thick liquids to allow pt and his wife a full  range of options from the menu that are easily masticated and avoid triggering TN pain. Discussed plan with pt and his wife, who are in agreement. No further SLP f/u is needed, will sign off at this time. SLP Visit Diagnosis: Dysphagia, unspecified (R13.10)    Aspiration Risk  Mild aspiration risk    Diet Recommendation Regular;Nectar-thick liquid    Liquid Administration via: Cup;Straw Medication Administration: Whole meds with puree Supervision: Staff to assist with self feeding;Full supervision/cueing for compensatory strategies Compensations: Minimize environmental distractions;Slow rate;Small sips/bites Postural Changes: Seated upright at 90 degrees    Other  Recommendations Oral Care Recommendations: Oral care BID    Recommendations for follow up therapy are one component of a multi-disciplinary discharge planning process, led by the attending physician.  Recommendations may be updated based on patient status, additional functional criteria and insurance authorization.  Follow up Recommendations No SLP follow up      Assistance Recommended at Discharge    Functional Status Assessment Patient has not had a recent decline in their functional status  Frequency and Duration            Prognosis Prognosis for improved oropharyngeal function: Good Barriers to Reach Goals: Time post onset      Swallow Study   General HPI: [name] is a 63 yo male presenting to ED 3/20 with AMS and concern for sepsis suspected to be secondary to UTI. CXR shows diffuse interstitial opacity, likely chronic in nature. Previously seen by SLP  who recommended pt continue regular diet with thin liquids to allow full range of choices. PMH includes MS, trigeminal neuralgia, chronic indwelling Foley catheter Type of Study: Bedside Swallow Evaluation Previous Swallow Assessment: see HPI Diet Prior to this Study: Regular;Mildly thick liquids (Level 2, nectar thick) Temperature Spikes Noted:  No Respiratory Status: Nasal cannula History of Recent Intubation: No Behavior/Cognition: Alert;Cooperative;Pleasant mood Oral Cavity Assessment: Within Functional Limits Oral Care Completed by SLP: No Oral Cavity - Dentition: Adequate natural dentition Vision: Functional for self-feeding Self-Feeding Abilities: Total assist Patient Positioning: Upright in bed Baseline Vocal Quality: Low vocal intensity Volitional Cough: Weak Volitional Swallow: Able to elicit    Oral/Motor/Sensory Function Overall Oral Motor/Sensory Function: Within functional limits   Ice Chips Ice chips: Not tested   Thin Liquid Thin Liquid: Not tested    Nectar Thick Nectar Thick Liquid: Within functional limits Presentation: Straw   Honey Thick Honey Thick Liquid: Not tested   Puree Puree: Within functional limits Presentation: Spoon   Solid     Solid: Not tested      Gwynneth Aliment, M.A., CF-SLP Speech Language Pathology, Acute Rehabilitation Services  Secure Chat preferred [phone]  12/30/2023,1:14 PM

## 2023-12-30 NOTE — ED Notes (Signed)
 Patient transported to MRI

## 2023-12-30 NOTE — ED Provider Notes (Signed)
 Rew EMERGENCY DEPARTMENT AT [facility] Provider Note   CSN: [id number] Arrival date & time: 12/30/23  0272     History  No chief complaint on file.   [name] is a 63 y.o. male.  HPI Adult male arrives via EMS with concern for listlessness.  History provided by EMS, patient is not speaking.  He does have a history of MS.  However, typically, the patient is interactive.  Over the past day the patient has been less interactive, subjectively febrile.  History is notable for chronic indwelling Foley catheter and the patient recently completed a course of antibiotics.  EMS reports the patient was only able to answer yes no to questioning, or as he is typically verbal.  Patient also typically moves his arms, but has decreased mobility as well as a new cough.  EMS reports no hemodynamic instability in transport.    Home Medications Prior to Admission medications   Medication Sig Start Date End Date Taking? Authorizing Provider  acetaminophen (TYLENOL) 500 MG tablet Take 1,000 mg by mouth in the morning, at noon, and at bedtime.    [provider]  albuterol (VENTOLIN HFA) 108 (90 Base) MCG/ACT inhaler TAKE 2 PUFFS BY MOUTH EVERY 6 HOURS AS NEEDED 12/15/23   Sater, Pearletha Furl, MD  ascorbic acid (VITAMIN C) 500 MG tablet Take 500 mg by mouth 2 (two) times daily.    [provider]  baclofen (LIORESAL) 10 MG tablet Take 10 mg by mouth daily as needed for muscle spasms. 07/30/22   [provider]  Carboxymethylcellulose Sodium (THERATEARS OP) Place 2-3 drops into both eyes as needed (dry eye).    [provider]  collagenase (SANTYL) 250 UNIT/GM ointment Apply 1 Application topically daily. 06/29/23   Sater, Pearletha Furl, MD  cyanocobalamin 1000 MCG tablet Take 1 tablet (1,000 mcg total) by mouth daily. 05/25/23   Almon Hercules, MD  escitalopram (LEXAPRO) 10 MG tablet Take 1 tablet (10 mg total) by mouth daily. 02/25/23   Sater, Pearletha Furl, MD   ferrous sulfate 325 (65 FE) MG EC tablet Take 1 tablet (325 mg total) by mouth 2 (two) times daily. 05/24/23 12/27/24  Almon Hercules, MD  ibuprofen (ADVIL) 200 MG tablet Take 600 mg by mouth as needed for headache or moderate pain.    [provider]  lamoTRIgine (LAMICTAL) 100 MG tablet Take 100 mg by mouth in the morning, 100 mg at lunch and 200 mg by mouth at bedtime. 12/01/23   Sater, Pearletha Furl, MD  leptospermum manuka honey (MEDIHONEY) PSTE paste Apply 1 Application topically daily. 10/24/22   Mapp, Gaylyn Cheers, MD  modafinil (PROVIGIL) 200 MG tablet Take 0.5 tablets (100 mg total) by mouth in the morning. 12/28/23   Sater, Pearletha Furl, MD  ocrelizumab 600 mg in sodium chloride 0.9 % 500 mL Inject 600 mg into the vein every 6 (six) months.     [provider]  oxyCODONE-acetaminophen (PERCOCET) 5-325 MG tablet Take 1 tablet by mouth every 12 (twelve) hours as needed for severe pain (pain score 7-10). 11/01/23 10/31/24  Sater, Pearletha Furl, MD  potassium chloride SA (KLOR-CON M) 20 MEQ tablet Take 2 tablets (40 mEq total) by mouth daily. 10/24/22 12/27/24  Mapp, Gaylyn Cheers, MD  pregabalin (LYRICA) 150 MG capsule Take 1 capsule (150 mg total) by mouth 3 (three) times daily. 07/21/23   Sater, Pearletha Furl, MD  zinc gluconate 50 MG tablet Take 50 mg by mouth at bedtime.  [provider]      Allergies    Gadopiclenol, Iodinated contrast media, and Zosyn [piperacillin-tazobactam in dex]    Review of Systems   Review of Systems  Physical Exam Updated Vital Signs BP 110/69   Pulse 87   Temp (!) 100.5 F (38.1 C) (Rectal)   Resp 20   Ht 5\' 10"  (1.778 m)   Wt 77 kg   SpO2 94%   BMI 24.36 kg/m  Physical Exam Vitals and nursing note reviewed.  Constitutional:      Appearance: He is well-developed. He is ill-appearing.  HENT:     Head: Normocephalic and atraumatic.  Eyes:     Conjunctiva/sclera: Conjunctivae normal.  Cardiovascular:     Rate and Rhythm: Normal rate and regular  rhythm.  Pulmonary:     Effort: Pulmonary effort is normal. No respiratory distress.     Breath sounds: No stridor.  Abdominal:     General: There is no distension.  Skin:    General: Skin is warm and dry.  Neurological:     Mental Status: He is alert.     Comments: Patient offers minimal responses, yes no, with nodding, follows conversations around him, moves his extremities minimally.     ED Results / Procedures / Treatments   Labs (all labs ordered are listed, but only abnormal results are displayed) Labs Reviewed  CULTURE, BLOOD (ROUTINE X 2)  CULTURE, BLOOD (ROUTINE X 2)  RESP PANEL BY RT-PCR (RSV, FLU A&B, COVID)  RVPGX2  COMPREHENSIVE METABOLIC PANEL  CBC WITH DIFFERENTIAL/PLATELET  PROTIME-INR  APTT  URINALYSIS, W/ REFLEX TO CULTURE (INFECTION SUSPECTED)  I-STAT CG4 LACTIC ACID, ED    EKG None  Radiology DG Chest Port 1 View Result Date: 12/30/2023 CLINICAL DATA:  Questionable sepsis. EXAM: PORTABLE CHEST 1 VIEW COMPARISON:  10/19/2022 FINDINGS: Markedly low volume film. The cardio pericardial silhouette is enlarged. Diffuse interstitial opacities likely chronic although component of superimposed interstitial edema not excluded. No pleural effusion. Diffuse gaseous distention of the colon noted in the visualized upper abdomen. Bones are diffusely demineralized. Heterogeneity in the proximal humerus is felt to be related to the marked osteopenia. IMPRESSION: Low volume film with diffuse interstitial opacity, likely chronic although component of associated edema not excluded. No focal consolidation or substantial pleural effusion. Electronically Signed   By: Kennith Center M.D.   On: 12/30/2023 07:49    Procedures Procedures    Medications Ordered in ED Medications  lactated ringers bolus 1,000 mL (1,000 mLs Intravenous New Bag/Given 12/30/23 0755)  acetaminophen (TYLENOL) 160 MG/5ML solution 650 mg (has no administration in time range)    ED Course/ Medical  Decision Making/ A&P                                 Medical Decision Making Adult male presents after being designated his code sepsis by EMS with concern for subjective fever, diarrhea, cough.  Patient's MS is limiting, but he seems to be follow commands and conversation appropriately.  With concern for fever, possible change in mental status, patient had labs x-ray urinalysis Tylenol monitoring Cardiac 90 sinus normal pulse ox 95% room air borderline  Amount and/or Complexity of Data Reviewed Independent Historian: EMS External Data Reviewed: notes. Labs: ordered. Decision-making details documented in ED Course. Radiology: ordered and independent interpretation performed. Decision-making details documented in ED Course. ECG/medicine tests: ordered and independent interpretation performed. Decision-making details documented in ED Course.  Risk OTC drugs. Prescription drug management. Decision regarding hospitalization. Diagnosis or treatment significantly limited by social determinants of health.  Update: Patient accompanied by his wife.  We discussed the patient's history, illness over the past 2 days, and images of the patient's decubitus ulcers.  Patient denies any and it is a code sepsis given his fever, leukocytosis, suspected source of infection, and mild hypotension.  Fluids started, though MAP remains above 65.  Patient's bedbound status, mild anasarca, pulmonary congestion, requires judicious provision of fluids. Patient's findings thus far notable for leukocytosis, leukocytes in his urine as well as a substantial bacteria.  Patient will receive aztreonam based on his allergy profile.  11:30 AM Patient being seen and evaluated by our admitting medicine team.  In essence this adult male with chronic indwelling Foley catheter, MS presents with fever, fatigue, decreased interactivity.  Patient does respond to questions, is not overtly encephalopathic, is found to have findings  concerning for urinary tract infection complicated by SIRS, numerically meeting sepsis criteria as well.  Patient received fluid resuscitation, antibiotics in the ED, required admission, stepdown.  Final Clinical Impression(s) / ED Diagnoses Final diagnoses:  Severe sepsis (HCC)    CRITICAL CARE Performed by: Gerhard Munch Total critical care time: 35 minutes Critical care time was exclusive of separately billable procedures and treating other patients. Critical care was necessary to treat or prevent imminent or life-threatening deterioration. Critical care was time spent personally by me on the following activities: development of treatment plan with patient and/or surrogate as well as nursing, discussions with consultants, evaluation of patient's response to treatment, examination of patient, obtaining history from patient or surrogate, ordering and performing treatments and interventions, ordering and review of laboratory studies, ordering and review of radiographic studies, pulse oximetry and re-evaluation of patient's condition.    Gerhard Munch, MD 12/30/23 [phone]

## 2023-12-30 NOTE — Assessment & Plan Note (Addendum)
 MS: Stable.  Received of Ocrevus 600 mg in January.  Called neurologist Dr. Epimenio Foot who reported that a crevice can cause immunological suppression worsening sepsis.  He will follow-up with him after he is discharged and consider changing therapy.  He reports Ocrevus will stay in his system for 8 months. Trigeminal neuralgia: Continue Lamotrigine 100 mg morning and at lunch, 200 mg at night.  Continue Lyrica 150 mg 3 times a day.  Depression: Continue Lexapro 10 mg daily

## 2023-12-30 NOTE — ED Notes (Addendum)
 Pending bed, dirty, 4E03.

## 2023-12-30 NOTE — ED Triage Notes (Addendum)
 Patient arrives via [facility] for possible sepsis due to suspected UTI. Patient has hx of MS, more stiff than usual. Patient able to answer yes and no questions, usually verbal. Usually mobile with left arm, now decreased mobility. Patient has two bedsores on left and right buttock. Chronic foley in place. Patient has rhoncorous cough, clear in upper lobes, diminished in lower. Sinus rhythm.   CBG 126 HR 90 BP 122/63 O2 97 on room air  20 L FA

## 2023-12-30 NOTE — Assessment & Plan Note (Addendum)
 Wounds visualized on admission.  Photos in chart.  With depth of wounds will order MRI to rule out osteomyelitis. - Wound care consult placed  - MRI ordered  - continue antibiotics as above and broaden

## 2023-12-30 NOTE — Plan of Care (Signed)
 FMTS Brief Progress Note  S: Evaluated patient at bedside for nighttime rounds.  Speaks in one-word sentences and with nodding of head, is able to let me know that he is not in any pain and that he is able to access the nursing call light as needed.   O: BP (!) 98/50 (BP Location: Left Arm)   Pulse 80   Temp 98.6 F (37 C) (Axillary)   Resp 17   Ht 5\' 10"  (1.778 m)   Wt 77 kg   SpO2 99%   BMI 24.36 kg/m   GEN: Chronically ill-appearing, no acute distress CV: Regular rate and rhythm no murmur Respiratory: Breathing comfortably on 3LNC  A/P: Sepsis Thought to be in the setting of indwelling catheter, sacral wounds.  Soft blood pressures but maps >65.  Now afebrile.  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 - Continue Zosyn, vancomycin - Continue to follow urine and blood cultures - Will reach out to Ortho in the morning regarding MRI findings - Rest of plan per day team  - Orders reviewed. Labs for AM ordered, which was adjusted as needed.   Para March, DO 12/30/2023, 11:04 PM PGY-1, [facility] Night Resident  Please page [phone] with questions.

## 2023-12-30 NOTE — ED Notes (Signed)
 EDP Dr. Jeraldine Loots at [facility] on arrival

## 2023-12-30 NOTE — ED Notes (Signed)
 MRI notified bed assigned.

## 2023-12-30 NOTE — H&P (Signed)
 Hospital Admission History and Physical Service Pager: [phone]  Patient name: [name] Medical record number: [id number] Date of Birth: 1961/02/24 Age: 63 y.o. Gender: male  Primary Care Provider: [facility] Consultants: none  Code Status: DNR which was confirmed with wife [name] and with patient who is oriented and has capacity. Preferred Emergency Contact:  Contact Information     Name Relation Home Work Mobile   [place] Spouse [phone]  [phone]   Slaton, Reaser Daughter   [phone]      Other Contacts   None on File      Chief Complaint: AMS, Fever  Assessment and Plan: RENARDO CHEATUM is a 63 y.o. male presenting with AMS and fever in the setting of chronic indwelling foley exchanged 2 days ago.  Patient meets SIRS criteria.  Multiple possible sources including indwelling urinary catheter, bedsores, and possible aspiration pneumonia.  Most likely source is urinary catheter as this has been an ongoing problem for him and with recent failure of outpatient oral antibiotic therapy.   Bedsores also possible as they are stage 4 but does not appear to have purulent drainage from the site.  Patient is able to self feed but routinely coughs and chokes with feeding.  He is not currently on oxygen and his chest x-ray was inconclusive for consolidative pneumonia.   Assessment & Plan Sepsis (HCC) Currently stable.  Status post two 1 L boluses.  Currently on 150 mL/h maintenance IV fluids.  No history of cardiovascular disease/heart failure.  Can continue aggressive fluid resuscitation.  Multiple sources: Indwelling urinary catheter, bedsores, unlikely to be aspiration pneumonia with pt on RA breathing comfortably with normal exam.  - Admit to FMTS, attending [place]  - Progressive, Vital signs per floor - Regular thick liquids diet  - PT/SLP to treat - VTE prophylaxis: Lovenox - Fluids: Continue 150 mL/hr LR - Continue antibiotics: S/p aztreonam  (3/20), broadening antibiotics to Zosyn and vancomycin - AM CBC/BMP  - Fall precautions - Delirium precautions - Follow urine cultures  - Follow blood cultures  Pressure ulcer Wounds visualized on admission.  Photos in chart.  With depth of wounds will order MRI to rule out osteomyelitis. - Wound care consult placed  - MRI ordered  - continue antibiotics as above and broaden  Chronic health problem MS: Stable.  Received of Ocrevus 600 mg in January.  Called neurologist Dr. Epimenio Foot who reported that a crevice can cause immunological suppression worsening sepsis.  He will follow-up with him after he is discharged and consider changing therapy.  He reports Ocrevus will stay in his system for 8 months. Trigeminal neuralgia: Continue Lamotrigine 100 mg morning and at lunch, 200 mg at night.  Continue Lyrica 150 mg 3 times a day.  Depression: Continue Lexapro 10 mg daily  FEN/GI: Regular diet, thick fluids VTE Prophylaxis: Lovenox  Disposition: Progressive  History of Present Illness:  [name] is a 63 y.o. male presenting with worsening mental confusion, speech, neurological symptoms related to his MS.  Wife at bedside provides majority of history.  She reports he has been dealing with a chronic infection in his indwelling Foley catheter since December.  He has been following ID recommendations at the [place].  She reports they just completed a 14-day course of cefadroxil on March 16th.  He has not had any improvement on this antibiotic outpatient.  He began to acutely decline yesterday morning into yesterday evening.  He became less communicative and more sleepy.  Neurological  baseline: Chronic left-sided weakness.  He is able to transfer to wheelchair using a Hoyer lift.  He is able to speak in short sentences.  He is oriented to person place and time.  He is able to feed himself with his right hand and hold a cup.  He is able to eat independently with occasional coughing and choking.  He is on a  thick liquid diet.  In the ED, met sepsis criteria with leukocytosis to 23.6, hypotension, and fever.  Chest x-ray inconclusive for pneumonia.  He was not on oxygen on presentation but when they changed his bed linens they noticed a desat and he was placed on 2 L.  He was given 2 L LR boluses and placed on 150 mL/h maintenance fluids.  He was given a dose of aztreonam in the ED.  Called for admission due to sepsis.  Review Of Systems: Per HPI with the following additions: As above  Pertinent Past Medical History: MS Trigeminal neuralgia Remainder reviewed in history tab.   Pertinent Past Surgical History: No prior surgeries Remainder reviewed in history tab.   Pertinent Social History: Tobacco use: Former, 2007 - pack per day Alcohol use: never  Other Substance use: never Lives with wife.  Pertinent Family History: Twin sister with MS, sister with MS  Remainder reviewed in history tab.   Important Outpatient Medications: Lexapro 10 mg Baclofen 10 mg as needed Modafinil 100 mg in the morning Percocet 5-325 mg as needed Lyrica 150 mg 3 times daily Albuterol 2 puffs every 4 hours as needed Remainder reviewed in medication history.   Objective: BP (!) 90/59   Pulse 78   Temp (!) 100.5 F (38.1 C) (Rectal)   Resp 17   Ht 5\' 10"  (1.778 m)   Wt 77 kg   SpO2 90%   BMI 24.36 kg/m  Exam: General: Chronically ill-appearing, no acute distress Eyes: Pupils equal and reactive to light Cardiovascular: Regular rate, regular rhythm, no murmur on exam Respiratory: Clear, no increased work of breathing Gastrointestinal: Mildly distended, no pain to palpation, bowel sounds present MSK: Diffuse atrophy on the left extremities, grip strength intact on the left but not right side.  Unable to move lower extremities.  Can minutely wiggle toes bilaterally Derm: To severe stage IV pressure injuries on the sacrum present.  Images in the chart packing in place Neuro: Follows commands,  oriented to person place and time.  Speaks in one-word sentences.  No facial drooping or asymmetry. Psych: Pleasant and appropriate  Labs:  CBC BMET  Recent Labs  Lab 12/30/23 0723  WBC 23.6*  HGB 14.1  HCT 44.3  PLT 262   Recent Labs  Lab 12/30/23 0723  NA 141  K 3.7  CL 105  CO2 26  BUN 17  CREATININE 0.90  GLUCOSE 119*  CALCIUM 8.7*    Pertinent additional labs Quad screen negative, UA significant for leukocytosis.  Lactic acid 0.9.  Blood cultures collected.  Urine cultures collected.  EKG: Sinus rhythm, normal axis without deviation, no ST changes.  Normal intervals.  Consistent with prior EKG on file   Imaging Studies Performed:  CXR: Low volume film with diffuse interstitial opacity, likely chronic although component of associated edema not excluded. No focal consolidation or substantial pleural effusion.   Glendale Chard, DO 12/30/2023, 9:43 AM PGY-2, [facility]  FPTS Intern pager: [phone], text pages welcome Secure chat group [facility] Teaching Service

## 2023-12-30 NOTE — Sepsis Progress Note (Signed)
 Elink monitoring for the code sepsis protocol.

## 2023-12-30 NOTE — ED Notes (Signed)
Lab at [facility]. Wife at [facility].  ?

## 2023-12-30 NOTE — ED Notes (Signed)
 MRI delayed d/t critical pts in department. MRI called and updated. Pt ready. MRI will call ED when they send for him. Pending MRI. SLP continuing home diet nectar thick liquids.

## 2023-12-30 NOTE — ED Notes (Signed)
 MRSA + will notify MD.

## 2023-12-30 NOTE — ED Notes (Signed)
 Wife at [facility]. Reports new catheter was placed 2d ago.

## 2023-12-30 NOTE — Progress Notes (Signed)
 Pharmacy Antibiotic Note  [name] is a 63 y.o. male admitted on 12/30/2023 with bacteremia.  Pharmacy has been consulted for vancomycin and Zosyn dosing.  Patient presented with AMS and fever in setting of chronic indwelling foley that was exchanged 2 days ago. He has multiple sources for infection including urinary catheter, bedsores, and possible aspiration pneumonia. WBC elevated to 23.8, febrile to 100.5, Scr 0.90. He received aztreonam 2g x 1 dose in the ED.  Plan: Zosyn 3.375g IV q8h (4 hour infusion). Vancomycin 1500 mg IV x 1 dose followed by 750 mg IV Q8h (eAUC 512, Vd 0.72, Scr 0.9) Monitor cultures, renal function, and vancomycin levels as indicated  Height: 5\' 10"  (177.8 cm) Weight: 77 kg (169 lb 12.1 oz) IBW/kg (Calculated) : 73  Temp (24hrs), Avg:99.1 F (37.3 C), Min:97.6 F (36.4 C), Max:100.5 F (38.1 C)  Recent Labs  Lab 12/30/23 0723 12/30/23 0747  WBC 23.6*  --   CREATININE 0.90  --   LATICACIDVEN  --  0.9    Estimated Creatinine Clearance: 90.1 mL/min (by C-G formula based on SCr of 0.9 mg/dL).    Allergies  Allergen Reactions   Gadopiclenol Nausea And Vomiting   Iodinated Contrast Media Nausea And Vomiting    Antimicrobials this admission: Aztreonam 3/20  Vancomycin 3/20 >>  Zosyn 3/20 >>  Dose adjustments this admission: N/A  Microbiology results: 3/20 BCx: pending 3/20 UCx: pending 3/20 MRSA PCR: pending  Thank you for allowing pharmacy to be a part of this patient's care.  Lennie Muckle, PharmD PGY1 Pharmacy Resident 12/30/2023 1:23 PM

## 2023-12-30 NOTE — Hospital Course (Addendum)
 [name] is a 63 year old male with past medical history significant for multiple sclerosis, spastic quadriparesis, depression, trigeminal neuralgia, neurogenic bladder, chronic indwelling Foley with recurrent CAUTI, sacral pressure ulcers, and normocytic anemia.  Presented with encephalopathy and met SIRS criteria.  His hospital course is outlined below.  Sepsis  Urinary Tract Infection Patient initially presented with encephalopathy, sepsis thought to be secondary to urinary source (indwelling foley) vs sacral wounds. U/A with moderate leukocytes and many bacteria, but was collected without foley catheter change. Pt received aggressive fluid resuscitation initially. He received Azetreonam (3/20-), Zosyn (3/20-), and Vancomycin (3/20-). 1/2 blood cultures grew Staph epidermidis with methicillin resistance, MRSA swab positive, and the above antibiotics were continued. Foley catheter was exchanged 3/21.   Urine culture grew pseudomonas. Transitioned to PO Ciprofloxacin, but culture not sensitive. Discussed case with infectious disease, recommended Fosfomycin x1, which patient received on day of discharge.  Stage IV decubitus ulcers Given sepsis, skin exam revealed stage IV decubitus ulcers on sacrum.  MRI was obtained, which showed periostitis and trace endosteal edema and enhancement along the ischial tuberosities at the apex of the large stage IV decubitus ulcers, thought to be reactive or subtle osteomyelitis.  On 3/21 primary team spoke with general surgery and orthopedic surgery, who both declined consult as no surgical intervention was deemed necessary.  Wound care consult was placed, and we followed their recommendations. Did not pursue chronic antibiotics for concern for osteomyelitis.  Other chronic conditions were medically managed with home medications and formulary alternatives as necessary (multiple sclerosis, trigeminal neuralgia, depression)  Follow-up recommendations Needs  follow-up with neurology, Dr. Epimenio Foot

## 2023-12-30 NOTE — ED Notes (Signed)
 MD at [facility]. Wife at [facility]. Pt sleeping, NAD, calm.

## 2023-12-30 NOTE — Care Plan (Signed)
 Patient with MAP 64 on my chart review and soft BP throughout the day.  Messaged RN to ask if IVF are running, she stated: "It was not . NOt sure why he came up from ER with no Fluids . Day shift said nothing about it. When I Iassess him I check order and got Fluids going again around 21:15."  Will continue to monitor BP. May have degree of dysautonomia as well with MS and spastic quadraparesis.   Darral Dash, DO PGY-3 [facility]

## 2023-12-31 DIAGNOSIS — G35 Multiple sclerosis: Secondary | ICD-10-CM | POA: Diagnosis not present

## 2023-12-31 DIAGNOSIS — L8990 Pressure ulcer of unspecified site, unspecified stage: Secondary | ICD-10-CM | POA: Diagnosis not present

## 2023-12-31 DIAGNOSIS — A419 Sepsis, unspecified organism: Secondary | ICD-10-CM | POA: Diagnosis not present

## 2023-12-31 DIAGNOSIS — R6520 Severe sepsis without septic shock: Secondary | ICD-10-CM | POA: Diagnosis not present

## 2023-12-31 LAB — BLOOD CULTURE ID PANEL (REFLEXED) - BCID2

## 2023-12-31 LAB — CBC
HCT: 41.3 % (ref 39.0–52.0)
Hemoglobin: 13.5 g/dL (ref 13.0–17.0)
MCH: 30.3 pg (ref 26.0–34.0)
MCHC: 32.7 g/dL (ref 30.0–36.0)
MCV: 92.6 fL (ref 80.0–100.0)
Platelets: 180 10*3/uL (ref 150–400)
RBC: 4.46 MIL/uL (ref 4.22–5.81)
RDW: 14.2 % (ref 11.5–15.5)
WBC: 23.5 10*3/uL — ABNORMAL HIGH (ref 4.0–10.5)
nRBC: 0 % (ref 0.0–0.2)

## 2023-12-31 LAB — BASIC METABOLIC PANEL
Anion gap: 13 (ref 5–15)
BUN: 14 mg/dL (ref 6–20)
CO2: 22 mmol/L (ref 22–32)
Calcium: 8.1 mg/dL — ABNORMAL LOW (ref 8.9–10.3)
Chloride: 105 mmol/L (ref 98–111)
Creatinine, Ser: 0.77 mg/dL (ref 0.61–1.24)
GFR, Estimated: >60 mL/min (ref >60)
Glucose, Bld: 78 mg/dL (ref 70–99)
Potassium: 3.9 mmol/L (ref 3.5–5.1)
Sodium: 140 mmol/L (ref 135–145)

## 2023-12-31 LAB — VANCOMYCIN, TROUGH: Vancomycin Tr: 17 ug/mL (ref 15–20)

## 2023-12-31 MED ORDER — CHLORHEXIDINE GLUCONATE CLOTH 2 % EX PADS
6.0000 | MEDICATED_PAD | Freq: Every day | CUTANEOUS | Status: DC
  Administered 2023-12-31 – 2024-01-02 (×3): 6 via TOPICAL

## 2023-12-31 MED ORDER — MUPIROCIN 2 % EX OINT
1.0000 | TOPICAL_OINTMENT | Freq: Two times a day (BID) | CUTANEOUS | Status: DC
  Administered 2023-12-31 – 2024-01-03 (×7): 1 via NASAL
  Filled 2023-12-31: qty 22

## 2023-12-31 MED ORDER — LACTATED RINGERS IV SOLN: INTRAVENOUS | Status: AC

## 2023-12-31 NOTE — Progress Notes (Signed)
 Spoke with Dr. Linwood Dibbles, patient and patient's wife at bedside regarding foley catheter. Pt had catheter exchange at home per wife on 12/27/23. Per policy, catheter should be exchanged in hospital setting since being treated for a UTI. Pt's wife has sterile clear catheter from home that she prefers him use over the hospital rubber catheters due to past issues. Will exchange using 2 person sterile technique and attach to a foley bag.

## 2023-12-31 NOTE — Care Plan (Addendum)
 MR sacrum, SI joints W and WO: Bilateral stage IV decubitus ulcers along buttocks extending to ischial tuberosities, no drainable abscess.  Periostitis w/ edema and trace enhancement which could be reactive or subtle osteomyelitis.  Absent coccyx.  Severe regional muscular atrophy.  Consulted Ortho PA on call: Given soft tissue ulcers, must consult General Surgery for intervention.  Consulted Gen Surg on call physician: Based on no surgical consult recommended by WOC and no drainable abscess, no intervention indicated for soft tissue ulcers unless primary team explicitly needs debridement.  If biopsy for ?osteo desired, must consult Ortho.

## 2023-12-31 NOTE — Progress Notes (Signed)
 Daily Progress Note Intern Pager: [phone]  Patient name: [name] Medical record number: [id number] Date of birth: July 17, 1961 Age: 63 y.o. Gender: male  Primary Care Provider: [facility] Consultants: Gen Surg and Ortho contacted Code Status: DNR limited  Pt Overview and Major Events to Date:  3/20 - Admitted, aztreonam broadened to vancomycin and Zosyn 3/21 - Ortho consult for possible osteomyelitis   Assessment and Plan: [name] is a 63 y.o. male with a pertinent PMH of multiple sclerosis, trigeminal neuralgia, and spastic quadriparesis who presented with encephalopathy and meeting SIRS criteria, concern for indwelling Foley UTI versus infection of stage IV decubitus ulcers on buttocks and ischial spines. MRI sacrum showed concern for possible osteomyelitis though no drainable abscess.  Currently undergoing antibiotic treatment.  Ortho recommending General Surgery consult and General Surgery noted that Ortho does osteomyelitis biopsies, no intervention needed for soft tissue unless there is abscess or WOC cannot provide adequate care.  When Orthopedic surgery was reconsulted, attending reviewed case and stated that they do not manage in this situation, may need IR if desiring biopsy or other treatment. Assessment & Plan Sepsis (HCC) Stable on IVF, though required repeat bolus overnight and BPs soft. Sources in consideration: Indwelling urinary catheter (leukocytes on UA), stage IV decubitus buttock ulcers, MRSA PCR positive. -Nectar thick liquids per SLP eval -PT eval and treat -Fluids: Continue 150 mL/hr LR -Continue antibiotics: Zosyn and vancomycin -Fall precautions -Follow UCx and BCx; gram negative rods in urine, Staph epidermidis in 1 bottle, likely contaminant. -AM CBC/BMP Pressure ulcer Stage IV decubitus ulcers with some concern for osteomyelitis on MRI.  With depth of wounds will order MRI to rule out osteomyelitis. -Wound care per WOC  consult (Dakin solution, moisture gauze packing, daily dressing changes) -Air mattress for offloading -Can consider ID consult or IR, as Gen Surg and Ortho see no room for intervention on their part Chronic health problem MS: Stable.  Received of Ocrevus 600 mg in January.  Called neurologist Dr. Epimenio Foot who reported that a crevice can cause immunological suppression worsening sepsis.  He will follow-up with him after he is discharged and consider changing therapy.  He reports Ocrevus will stay in his system for 8 months. Trigeminal neuralgia: Continue Lamotrigine 100 mg morning and at lunch, 200 mg at night.  Continue pregabalin 150 mg TID. Depression: Continue escitalopram 10 mg daily.  FEN/GI: Regular diet with nectar thickened liquids PPx: Lovenox Dispo: Pending PT recommendations and clinical stabilization. Barriers include sepsis workup and wound treatment.  Subjective:  No acute events overnight.  This morning, patient is present with wife at bedside.  Doing well, somewhat drowsy, but alert and oriented.  Wife notes that they have a pressure-offloading mattress at home and that they follow monthly with wound care outpatient.  Objective: Temp:  [97.6 F (36.4 C)-100.1 F (37.8 C)] 98.3 F (36.8 C) (03/21 0359) Pulse Rate:  [68-90] 77 (03/21 0529) Resp:  [15-23] 18 (03/21 0529) BP: (79-111)/(49-79) 100/55 (03/21 0529) SpO2:  [89 %-100 %] 100 % (03/21 0529)  Physical Exam: General: Chronically ill, resting comfortably in bed, NAD, drowsy but rousable and alert and oriented x4. Cardiovascular: Regular rate and rhythm. Normal S1/S2. No murmurs, rubs, or gallops appreciated. 2+ radial pulses. Pulmonary: Clear bilaterally to ascultation. Normal WOB on room air. Abdominal: Normoactive bowel sounds, mild distension. No tenderness to deep or light palpation. No rebound or guarding. Skin: Warm and dry.  Stage IV sacral ulcers dressed and without drainage.  Extremities: Significantly atrophied  BLE.  No peripheral edema bilaterally. Capillary refill <2 seconds.  Laboratory: Most recent CBC Lab Results  Component Value Date   WBC 23.5 (H) 12/31/2023   HGB 13.5 12/31/2023   HCT 41.3 12/31/2023   MCV 92.6 12/31/2023   PLT 180 12/31/2023   Most recent BMP    Latest Ref Rng & Units 12/31/2023    4:06 AM  BMP  Glucose 70 - 99 mg/dL 78   BUN 6 - 20 mg/dL 14   Creatinine 2.95 - 1.24 mg/dL 2.84   Sodium 132 - 440 mmol/L 140   Potassium 3.5 - 5.1 mmol/L 3.9   Chloride 98 - 111 mmol/L 105   CO2 22 - 32 mmol/L 22   Calcium 8.9 - 10.3 mg/dL 8.1     Other pertinent labs: -BCx with Staph epidermidis in 1 bottle, possible contaminant. -MRSA PCR positive  New Imaging/Diagnostic Tests: -MR sacrum, SI joints W and WO: Bilateral stage IV decubitus ulcers along buttocks extending to ischial tuberosities, no drainable abscess.  Periostitis w/ edema and trace enhancement which could be reactive or subtle osteomyelitis.  Absent coccyx.  Severe regional muscular atrophy.  Sherrie Marsan Sharion Dove, MD 12/31/2023, 8:08 AM  PGY-1, [facility] FPTS Intern pager: [phone], text pages welcome Secure chat group [facility] Teaching Service

## 2023-12-31 NOTE — Progress Notes (Signed)
 PHARMACY - PHYSICIAN COMMUNICATION CRITICAL VALUE ALERT - BLOOD CULTURE IDENTIFICATION (BCID)  [name] is an 63 y.o. male with MS And who presented to [facility] on 12/30/2023 with a chief complaint of urosepsis and sacral decubitus ulcers   Assessment:   1/2 blood cultures growing Staphylococcus epidermidis  Name of physician (or Provider) Contacted:  Dr. Sharion Dove  Current antibiotics:  Vancomycin and Zosyn   Changes to prescribed antibiotics recommended:  No changes needed at this time   Results for orders placed or performed during the hospital encounter of 12/30/23  Blood Culture ID Panel (Reflexed) (Collected: 12/30/2023  8:23 AM)  Result Value Ref Range   Enterococcus faecalis NOT DETECTED NOT DETECTED   Enterococcus Faecium NOT DETECTED NOT DETECTED   Listeria monocytogenes NOT DETECTED NOT DETECTED   Staphylococcus species DETECTED (A) NOT DETECTED   Staphylococcus aureus (BCID) NOT DETECTED NOT DETECTED   Staphylococcus epidermidis DETECTED (A) NOT DETECTED   Staphylococcus lugdunensis NOT DETECTED NOT DETECTED   Streptococcus species NOT DETECTED NOT DETECTED   Streptococcus agalactiae NOT DETECTED NOT DETECTED   Streptococcus pneumoniae NOT DETECTED NOT DETECTED   Streptococcus pyogenes NOT DETECTED NOT DETECTED   A.calcoaceticus-baumannii NOT DETECTED NOT DETECTED   Bacteroides fragilis NOT DETECTED NOT DETECTED   Enterobacterales NOT DETECTED NOT DETECTED   Enterobacter cloacae complex NOT DETECTED NOT DETECTED   Escherichia coli NOT DETECTED NOT DETECTED   Klebsiella aerogenes NOT DETECTED NOT DETECTED   Klebsiella oxytoca NOT DETECTED NOT DETECTED   Klebsiella pneumoniae NOT DETECTED NOT DETECTED   Proteus species NOT DETECTED NOT DETECTED   Salmonella species NOT DETECTED NOT DETECTED   Serratia marcescens NOT DETECTED NOT DETECTED   Haemophilus influenzae NOT DETECTED NOT DETECTED   Neisseria meningitidis NOT DETECTED NOT DETECTED   Pseudomonas  aeruginosa NOT DETECTED NOT DETECTED   Stenotrophomonas maltophilia NOT DETECTED NOT DETECTED   Candida albicans NOT DETECTED NOT DETECTED   Candida auris NOT DETECTED NOT DETECTED   Candida glabrata NOT DETECTED NOT DETECTED   Candida krusei NOT DETECTED NOT DETECTED   Candida parapsilosis NOT DETECTED NOT DETECTED   Candida tropicalis NOT DETECTED NOT DETECTED   Cryptococcus neoformans/gattii NOT DETECTED NOT DETECTED   Methicillin resistance mecA/C DETECTED (A) NOT DETECTED    [name] 12/31/2023  6:40 AM

## 2023-12-31 NOTE — Progress Notes (Signed)
 PT Cancellation Note  Patient Details Name: [name] MRN: [id number] DOB: 11-19-1960   Cancelled Treatment:    Reason Eval/Treat Not Completed: PT screened, no needs identified. Discussed with wife that pt has been using a hoyer lift for mobility for multiple years. Pt has necessary DME with wife feeling comfortable returning home providing 24/7 care upon d/c from hospital. Wife has no further questions or concerns. Acute PT signing off. Please re-consult with any changes to plan of care.  Hilton Cork, PT, DPT Secure Chat [facility] [phone]   Arturo Morton Brion Aliment 12/31/2023, 4:27 PM

## 2023-12-31 NOTE — Assessment & Plan Note (Signed)
 MS: Stable.  Received of Ocrevus 600 mg in January.  Called neurologist Dr. Epimenio Foot who reported that a crevice can cause immunological suppression worsening sepsis.  He will follow-up with him after he is discharged and consider changing therapy.  He reports Ocrevus will stay in his system for 8 months. Trigeminal neuralgia: Continue Lamotrigine 100 mg morning and at lunch, 200 mg at night.  Continue pregabalin 150 mg TID. Depression: Continue escitalopram 10 mg daily.

## 2023-12-31 NOTE — Assessment & Plan Note (Addendum)
 Stage IV decubitus ulcers with some concern for osteomyelitis on MRI.  With depth of wounds will order MRI to rule out osteomyelitis. -Wound care per WOC consult (Dakin solution, moisture gauze packing, daily dressing changes) -Air mattress for offloading -Can consider ID consult or IR, as Gen Surg and Ortho see no room for intervention on their part

## 2023-12-31 NOTE — Assessment & Plan Note (Addendum)
 Stable on IVF, though required repeat bolus overnight and BPs soft. Sources in consideration: Indwelling urinary catheter (leukocytes on UA), stage IV decubitus buttock ulcers, MRSA PCR positive. -Nectar thick liquids per SLP eval -PT eval and treat -Fluids: Continue 150 mL/hr LR -Continue antibiotics: Zosyn and vancomycin -Fall precautions -Follow UCx and BCx; gram negative rods in urine, Staph epidermidis in 1 bottle, likely contaminant. -AM CBC/BMP

## 2024-01-01 LAB — BASIC METABOLIC PANEL
Anion gap: 7 (ref 5–15)
Anion gap: 8 (ref 5–15)
BUN: 11 mg/dL (ref 6–20)
BUN: 11 mg/dL (ref 6–20)
CO2: 23 mmol/L (ref 22–32)
CO2: 24 mmol/L (ref 22–32)
Calcium: 7.6 mg/dL — ABNORMAL LOW (ref 8.9–10.3)
Calcium: 8 mg/dL — ABNORMAL LOW (ref 8.9–10.3)
Chloride: 109 mmol/L (ref 98–111)
Chloride: 110 mmol/L (ref 98–111)
Creatinine, Ser: 0.81 mg/dL (ref 0.61–1.24)
Creatinine, Ser: 0.81 mg/dL (ref 0.61–1.24)
GFR, Estimated: >60 mL/min (ref >60)
GFR, Estimated: >60 mL/min (ref >60)
Glucose, Bld: 83 mg/dL (ref 70–99)
Glucose, Bld: 108 mg/dL — ABNORMAL HIGH (ref 70–99)
Potassium: 2.9 mmol/L — ABNORMAL LOW (ref 3.5–5.1)
Potassium: 3.4 mmol/L — ABNORMAL LOW (ref 3.5–5.1)
Sodium: 139 mmol/L (ref 135–145)
Sodium: 142 mmol/L (ref 135–145)

## 2024-01-01 LAB — CBC
HCT: 35.7 % — ABNORMAL LOW (ref 39.0–52.0)
Hemoglobin: 11.6 g/dL — ABNORMAL LOW (ref 13.0–17.0)
MCH: 29.7 pg (ref 26.0–34.0)
MCHC: 32.5 g/dL (ref 30.0–36.0)
MCV: 91.3 fL (ref 80.0–100.0)
Platelets: 219 10*3/uL (ref 150–400)
RBC: 3.91 MIL/uL — ABNORMAL LOW (ref 4.22–5.81)
RDW: 14.2 % (ref 11.5–15.5)
WBC: 17 10*3/uL — ABNORMAL HIGH (ref 4.0–10.5)
nRBC: 0 % (ref 0.0–0.2)

## 2024-01-01 LAB — MAGNESIUM: Magnesium: 1.7 mg/dL (ref 1.7–2.4)

## 2024-01-01 MED ORDER — POTASSIUM CHLORIDE CRYS ER 20 MEQ PO TBCR
20.0000 meq | EXTENDED_RELEASE_TABLET | Freq: Once | ORAL | Status: AC
  Administered 2024-01-01: 20 meq via ORAL
  Filled 2024-01-01: qty 1

## 2024-01-01 MED ORDER — ENSURE ENLIVE PO LIQD
237.0000 mL | Freq: Two times a day (BID) | ORAL | Status: DC
  Administered 2024-01-01 – 2024-01-03 (×4): 237 mL via ORAL
  Filled 2024-01-01: qty 237

## 2024-01-01 MED ORDER — POTASSIUM CHLORIDE CRYS ER 20 MEQ PO TBCR
60.0000 meq | EXTENDED_RELEASE_TABLET | Freq: Once | ORAL | Status: AC
  Administered 2024-01-01: 60 meq via ORAL
  Filled 2024-01-01: qty 3

## 2024-01-01 NOTE — Assessment & Plan Note (Signed)
 MS: Stable.  Received of Ocrevus 600 mg in January.  Called neurologist Dr. Epimenio Foot who reported that a crevice can cause immunological suppression worsening sepsis.  He will follow-up with him after he is discharged and consider changing therapy.  He reports Ocrevus will stay in his system for 8 months. Trigeminal neuralgia: Continue Lamotrigine 100 mg morning and at lunch, 200 mg at night.  Continue pregabalin 150 mg TID. Depression: Continue escitalopram 10 mg daily.

## 2024-01-01 NOTE — Assessment & Plan Note (Addendum)
 Stage IV decubitus ulcers with some concern for osteomyelitis on MRI.  Will discuss case with infectious disease.  General surgery and orthopedics stated no intervention from them at this time. -Wound care per [facility] consult (Dakin solution, moisture gauze packing, daily dressing changes) -Air mattress for offloading -Consider ID consult or IR  Hypokalema K 2.9, repleted with K supplement 60 mg p.o.  Hypotension BP borderline low with appropriate MAP.  Likely in the setting of ongoing infection vs dysautonomia given MS -Monitor with routine vitals

## 2024-01-01 NOTE — Plan of Care (Signed)
 ?  Problem: Clinical Measurements: ?Goal: Will remain free from infection ?Outcome: Progressing ?  ?

## 2024-01-01 NOTE — Progress Notes (Signed)
 Daily Progress Note Intern Pager: [phone]  Patient name: [name] Medical record number: [id number] Date of birth: [birth year] Age: 63 y.o. Gender: male  Primary Care Provider: [facility] Consultants: General surgeon and  Ortho contacted Code Status: DNR Limited  Pt Overview and Major Events to Date:  3/20-admitted, azithromycin now broadened to vancomycin and Zosyn 3/21-Ortho and GEN surge consult contacted   Assessment and Plan: [name] is a 63 year old with past medical history of multiple sclerosis, trigeminal neuralgia and spastic quadriplegic admitted for encephalopathy a suspected to be in the setting of ongoing sepsis secondary to UTI infection. Assessment & Plan Sepsis (HCC) Suspected to be secondary to UTI however other possible sources include MRSA swab or stage IV decubitus ulcer with possible osteomyelitis.  Still afebrile, with some soft BPs likely dysautonomia  with MS. leukocytosis improving patient, currently on IV antibiotics. -PT eval and treat -Continue antibiotics: Zosyn and vancomycin -Follow UCx and BCx; gram negative rods in urine, Staph epidermidis in 1 bottle, likely contaminant. -Soft Bps (MAP goal: >60)  -AM CBC/BMP Pressure ulcer Stage IV decubitus ulcers with some concern for osteomyelitis on MRI.  Will discuss case with infectious disease.  General surgery and orthopedics stated no intervention from them at this time. -Wound care per [facility] consult (Dakin solution, moisture gauze packing, daily dressing changes) -Air mattress for offloading -Consider ID consult or IR  Hypokalema K 2.9, repleted with K supplement 60 mg p.o.  Hypotension BP borderline low with appropriate MAP.  Likely in the setting of ongoing infection vs dysautonomia given MS -Monitor with routine vitals Chronic health problem MS: Stable.  Received of Ocrevus 600 mg in January.  Called neurologist Dr. Epimenio Foot who reported that a crevice can cause  immunological suppression worsening sepsis.  He will follow-up with him after he is discharged and consider changing therapy.  He reports Ocrevus will stay in his system for 8 months. Trigeminal neuralgia: Continue Lamotrigine 100 mg morning and at lunch, 200 mg at night.  Continue pregabalin 150 mg TID. Depression: Continue escitalopram 10 mg daily.   FEN/GI: Regular diet with nectar thickened liquids PPx: Lovenox Dispo:Home pending clinical improvement .   Subjective:  Patient laying in bed with wife at bedside. Reports improvement but not at baseline yet. No concerns currently other than still being fatigued.  Objective: Temp:  [98 F (36.7 C)-99.1 F (37.3 C)] 98.5 F (36.9 C) (03/22 0804) Pulse Rate:  [77-93] 93 (03/22 0035) Resp:  [16-18] 17 (03/22 0035) BP: (84-95)/(48-55) 84/49 (03/22 0804) SpO2:  [92 %-100 %] 92 % (03/22 0804) Physical Exam: General: Alert, non-toxic appearing NAD CV: RRR, no murmurs, normal S1/S2 Pulm: CTAB, good WOB on RA Abd: Soft, no distension, no tenderness Ext: No BLE edema   Laboratory: Most recent CBC Lab Results  Component Value Date   WBC 17.0 (H) 01/01/2024   HGB 11.6 (L) 01/01/2024   HCT 35.7 (L) 01/01/2024   MCV 91.3 01/01/2024   PLT 219 01/01/2024   Most recent BMP    Latest Ref Rng & Units 01/01/2024    3:04 AM  BMP  Glucose 70 - 99 mg/dL 83   BUN 6 - 20 mg/dL 11   Creatinine 2.44 - 1.24 mg/dL 0.10   Sodium 272 - 536 mmol/L 139   Potassium 3.5 - 5.1 mmol/L 2.9   Chloride 98 - 111 mmol/L 109   CO2 22 - 32 mmol/L 23   Calcium 8.9 - 10.3 mg/dL 7.6  Imaging/Diagnostic Tests: No new images  Jerre Simon, MD 01/01/2024, 8:09 AM  PGY-3, [facility] FPTS Intern pager: [phone], text pages welcome Secure chat group [facility] Teaching Service

## 2024-01-01 NOTE — Assessment & Plan Note (Addendum)
 Suspected to be secondary to UTI however other possible sources include MRSA swab or stage IV decubitus ulcer with possible osteomyelitis.  Still afebrile, with some soft BPs likely dysautonomia  with MS. leukocytosis improving patient, currently on IV antibiotics. -PT eval and treat -Continue antibiotics: Zosyn and vancomycin -Follow UCx and BCx; gram negative rods in urine, Staph epidermidis in 1 bottle, likely contaminant. -Soft Bps (MAP goal: >60)  -AM CBC/BMP

## 2024-01-01 NOTE — Progress Notes (Signed)
 Patient experienced multiple bowel movements today.  Patient's wife mentioned that she feels it is due to the antibiotics.  Wound dressings changed 3x due to repeated bowel movements.

## 2024-01-01 NOTE — Plan of Care (Signed)
  Problem: Activity: Goal: Risk for activity intolerance will decrease Outcome: Progressing   Problem: Nutrition: Goal: Adequate nutrition will be maintained Outcome: Progressing   Problem: Coping: Goal: Level of anxiety will decrease Outcome: Progressing   Problem: Elimination: Goal: Will not experience complications related to bowel motility Outcome: Progressing   Problem: Elimination: Goal: Will not experience complications related to urinary retention Outcome: Progressing   Problem: Pain Managment: Goal: General experience of comfort will improve and/or be controlled Outcome: Progressing   Problem: Safety: Goal: Ability to remain free from injury will improve Outcome: Progressing   Problem: Skin Integrity: Goal: Risk for impaired skin integrity will decrease Outcome: Progressing

## 2024-01-01 NOTE — Progress Notes (Signed)
 Pharmacy Antibiotic Note  [name] is a 63 y.o. male admitted on 12/30/2023 with bacteremia.  Pharmacy has been consulted for vancomycin and Zosyn dosing.  Patient presented with AMS and fever in setting of chronic indwelling foley that was exchanged 2 days ago. He has multiple sources for infection including urinary catheter, bedsores, and possible aspiration pneumonia. WBC elevated to 23.8, febrile to 100.5, Scr 0.90. He received aztreonam 2g x 1 dose in the ED.  Vancomycin trough on 3/21 shows therapeutic value at 17. Will plan to continue current dosing regimen.   Plan: Continue Zosyn 3.375g IV q8h (4 hour infusion). Continue Vancomycin 1500 mg IV x 1 dose followed by 750 mg IV Q8h  Monitor cultures, renal function, and vancomycin levels as indicated  Height: 5\' 10"  (177.8 cm) Weight: 77 kg (169 lb 12.1 oz) IBW/kg (Calculated) : 73  Temp (24hrs), Avg:98.5 F (36.9 C), Min:98 F (36.7 C), Max:98.9 F (37.2 C)  Recent Labs  Lab 12/30/23 0723 12/30/23 0747 12/30/23 1443 12/31/23 0406 12/31/23 1247 01/01/24 0304  WBC 23.6*  --   --  23.5*  --  17.0*  CREATININE 0.90  --   --  0.77  --  0.81  LATICACIDVEN  --  0.9 1.2  --   --   --   VANCOTROUGH  --   --   --   --  17  --     Estimated Creatinine Clearance: 100.1 mL/min (by C-G formula based on SCr of 0.81 mg/dL).    Allergies  Allergen Reactions   Gadopiclenol Nausea And Vomiting   Iodinated Contrast Media Nausea And Vomiting    Antimicrobials this admission: Aztreonam 3/20  Vancomycin 3/20 >>  Zosyn 3/20 >>  Dose adjustments this admission: N/A  Microbiology results: 3/20 BCx: pending 3/20 UCx: pending 3/20 MRSA PCR: pending  Thank you for allowing pharmacy to be a part of this patient's care.  Blane Ohara, PharmD, BCPS PGY2 Pharmacy Resident

## 2024-01-02 DIAGNOSIS — I959 Hypotension, unspecified: Secondary | ICD-10-CM | POA: Insufficient documentation

## 2024-01-02 DIAGNOSIS — G35 Multiple sclerosis: Secondary | ICD-10-CM | POA: Diagnosis not present

## 2024-01-02 DIAGNOSIS — L8993 Pressure ulcer of unspecified site, stage 3: Secondary | ICD-10-CM

## 2024-01-02 DIAGNOSIS — A419 Sepsis, unspecified organism: Secondary | ICD-10-CM | POA: Diagnosis not present

## 2024-01-02 DIAGNOSIS — N390 Urinary tract infection, site not specified: Secondary | ICD-10-CM

## 2024-01-02 LAB — BASIC METABOLIC PANEL
Anion gap: 7 (ref 5–15)
BUN: 12 mg/dL (ref 6–20)
CO2: 24 mmol/L (ref 22–32)
Calcium: 8 mg/dL — ABNORMAL LOW (ref 8.9–10.3)
Chloride: 111 mmol/L (ref 98–111)
Creatinine, Ser: 0.76 mg/dL (ref 0.61–1.24)
GFR, Estimated: >60 mL/min (ref >60)
Glucose, Bld: 90 mg/dL (ref 70–99)
Potassium: 3.5 mmol/L (ref 3.5–5.1)
Sodium: 142 mmol/L (ref 135–145)

## 2024-01-02 LAB — CBC
HCT: 36.1 % — ABNORMAL LOW (ref 39.0–52.0)
Hemoglobin: 11.7 g/dL — ABNORMAL LOW (ref 13.0–17.0)
MCH: 29.3 pg (ref 26.0–34.0)
MCHC: 32.4 g/dL (ref 30.0–36.0)
MCV: 90.5 fL (ref 80.0–100.0)
Platelets: 231 10*3/uL (ref 150–400)
RBC: 3.99 MIL/uL — ABNORMAL LOW (ref 4.22–5.81)
RDW: 14.3 % (ref 11.5–15.5)
WBC: 14.2 10*3/uL — ABNORMAL HIGH (ref 4.0–10.5)
nRBC: 0 % (ref 0.0–0.2)

## 2024-01-02 LAB — CULTURE, BLOOD (ROUTINE X 2)

## 2024-01-02 MED ORDER — DAKINS (1/4 STRENGTH) 0.125 % EX SOLN
Freq: Two times a day (BID) | CUTANEOUS | Status: DC
  Filled 2024-01-02: qty 473

## 2024-01-02 NOTE — Assessment & Plan Note (Addendum)
 Remains afebrile, leukocytosis progressively improving.  Sepsis now resolved, patient does have underlying dysautonomia contributing to hypotension.  Per ID, suspect UTI as primary source.  Urine growing pseudomonas resistant to ciprofloxacin.  Notably, sample obtained before Foley was replaced. -Discontinue Zosyn and vancomycin per ID, plan for 1 dose fosfomycin tomorrow -Follow UCx and BCx; gram negative rods in urine, Staph epidermidis in 1 bottle, likely contaminant. -MAP goal: >60 -AM CBC/BMP -Appreciate ID recs: -Can DC vancomycin, keep Zosyn for UTI coverage, complete acute treatment course  -No chronic antibiotics for ?osteo given chronic wound  -Monitor fever curve on new abx course

## 2024-01-02 NOTE — Discharge Instructions (Addendum)
 Dear [name],   Thank you for letting us participate in your care! In this section, you will find a brief hospital admission summary of why you were admitted to the hospital, what happened during your admission, your diagnosis/diagnoses, and recommended follow up. You were admitted because you were experiencing fever and confusion. Your testing revealed urinary tract infection. You were treated with antibiotics. You were also seen by orthopedic surgery and wound care. They recommended general wound care (using medical honey, moisture gauze packing with daily dressing changes.    POST-HOSPITAL & CARE INSTRUCTIONS Please change your wound dressings daily. Please let PCP/Specialists know of any changes in medications that were made.  Please see medications section of this packet for any medication changes.   DOCTOR'S APPOINTMENTS & FOLLOW UP Future Appointments  Date Time Provider Department Center  01/13/2024  9:30 AM Camelia Phenes, DO [facility]  07/27/2024  8:30 AM Sater, Pearletha Furl, MD [facility] None     Thank you for choosing [facility]! Take care and be well!  Family Medicine Teaching Service Inpatient Team [place]  [facility]  [address] [place], [place] [address] [phone]

## 2024-01-02 NOTE — Progress Notes (Signed)
 Daily Progress Note Intern Pager: [phone]  Patient name: [name] Medical record number: [id number] Date of birth: 08-14-1961 Age: 63 y.o. Gender: male  Primary Care Provider: [facility] Consultants: Infectious Disease Code Status: DNR limited  Pt Overview and Major Events to Date:  3/20 - Admitted, azithromycin started and then broadened to vancomycin and Zosyn 3/21 - Ortho and Gen Surg contacted, no formal consult indicated 3/22 - Urine Cx growing pseudomonas 3/23 - Pseudomonas resistant to cipro; ID consulted, vancomycin and Zosyn discontinued   Assessment and Plan: [name] is a 63 y.o. male with a pertinent PMH of multiple sclerosis, trigeminal neuralgia, spastic quadriparesis admitted with encephalopathy in the setting of sepsis suspected 2/2 pressure ulcer infection versus UTI, currently undergoing IV antibiotic treatment.  Consulted Dr. Daiva Eves with ID concerning source control and antibiotics course, appreciate recommendations: No need for cellulitis coverage for wound unless there is profound purulence.  Given positive MRSA PCR, will add contact precautions.  Discontinue vancomycin, keep Zosyn and monitor leukocytosis, fever curve. Treat with acute course of abx for pseudomonas UTI, but will not pursue chronic antibiotics for possible osteomyelitis concern given that wounds are chronic without improvement.  Later clarified duration of Abx with ID, who stated that Zosyn can be discontinued now given 3 days of successful treatment.  Fosfomycin can be given once per primary team at discretion.  Will plan for fosfomycin x1 tomorrow, then discharge home. Assessment & Plan UTI (urinary tract infection) Remains afebrile, leukocytosis progressively improving.  Sepsis now resolved, patient does have underlying dysautonomia contributing to hypotension.  Per ID, suspect UTI as primary source.  Urine growing pseudomonas resistant to ciprofloxacin.  Notably, sample  obtained before Foley was replaced. -Discontinue Zosyn and vancomycin per ID, plan for 1 dose fosfomycin tomorrow -Follow UCx and BCx; gram negative rods in urine, Staph epidermidis in 1 bottle, likely contaminant. -MAP goal: >60 -AM CBC/BMP -Appreciate ID recs: -Can DC vancomycin, keep Zosyn for UTI coverage, complete acute treatment course  -No chronic antibiotics for ?osteo given chronic wound  -Monitor fever curve on new abx course Pressure ulcer Stage IV decubitus ulcers with some concern for osteomyelitis on MRI.  Will discuss case with infectious disease.  General surgery and orthopedics stated no intervention from them at this time. -Wound care per [facility] consult (Dakin solution, moisture gauze packing, daily dressing changes) -Air mattress for offloading -Consider ID consult or IR Hypotension BP borderline low with appropriate MAP.  Likely has underlying dysautonomia given MS, however infection may be contributing. -Monitor with routine vitals Hypokalemia (Resolved: 01/02/2024) Resolved. Chronic health problem MS: Stable.  Received of Ocrevus 600 mg in January.  Called neurologist Dr. Epimenio Foot who reported that a crevice can cause immunological suppression worsening sepsis.  He will follow-up with him after he is discharged and consider changing therapy.  He reports Ocrevus will stay in his system for 8 months. Trigeminal neuralgia: Continue Lamotrigine 100 mg morning and at lunch, 200 mg at night.  Continue pregabalin 150 mg TID. Depression: Continue escitalopram 10 mg daily.  FEN/GI: Regular diet with nectar thick liquids PPx: Lovenox Dispo: Home with home health PT/OT pending cellulitis treatment and infectious disease plan.  Subjective:  This morning, patient is more alert and able to answer questions appropriately.  Wife is at bedside and also reports that he is nearing his baseline with strengthening of voice, better movement, better cognition, "more like himself."  She notes  that the back of his neck was bumped during  transfer from stretcher to bed and it has been hurting a bit, patient states pain is minimal.  Objective: Temp:  [98.1 F (36.7 C)-98.7 F (37.1 C)] 98.7 F (37.1 C) (03/23 0400) Pulse Rate:  [70-84] 70 (03/23 0400) Resp:  [14-19] 14 (03/23 0400) BP: (84-96)/(48-52) 85/52 (03/23 0400) SpO2:  [90 %-99 %] 96 % (03/23 0400)  Physical Exam: General: Age-appropriate, resting comfortably in bed, NAD, alert and at baseline.  Moved up in bed with nurse tech assistance. HEENT: Head: Normocephalic, atraumatic. Mild TTP over occiput. Eyes: PERRLA. Cardiovascular: Regular rate and rhythm. Normal S1/S2. No murmurs, rubs, or gallops appreciated. 2+ radial pulses. Pulmonary: Clear bilaterally to ascultation. No wheezes, crackles, or rhonchi. Normal WOB on room air. Abdominal: Normoactive bowel sounds, nondistended. No tenderness to deep or light palpation. No rebound or guarding. Skin: Sacral ulcers with dressings, no drainage or purulence. Extremities: Trace peripheral edema bilaterally. Capillary refill <2 seconds.  Laboratory: Most recent CBC Lab Results  Component Value Date   WBC 14.2 (H) 01/02/2024   HGB 11.7 (L) 01/02/2024   HCT 36.1 (L) 01/02/2024   MCV 90.5 01/02/2024   PLT 231 01/02/2024   Most recent BMP    Latest Ref Rng & Units 01/02/2024    3:01 AM  BMP  Glucose 70 - 99 mg/dL 90   BUN 6 - 20 mg/dL 12   Creatinine 1.61 - 1.24 mg/dL 0.96   Sodium 045 - 409 mmol/L 142   Potassium 3.5 - 5.1 mmol/L 3.5   Chloride 98 - 111 mmol/L 111   CO2 22 - 32 mmol/L 24   Calcium 8.9 - 10.3 mg/dL 8.0     Other pertinent labs: -None  New Imaging/Diagnostic Tests: -None  Nahiem Dredge, MD 01/02/2024, 7:03 AM  PGY-1, [place] [facility] FPTS Intern pager: [phone], text pages welcome Secure chat group [facility] Teaching Service

## 2024-01-02 NOTE — Assessment & Plan Note (Signed)
 BP borderline low with appropriate MAP.  Likely has underlying dysautonomia given MS, however infection may be contributing. -Monitor with routine vitals

## 2024-01-02 NOTE — Assessment & Plan Note (Addendum)
 Stage IV decubitus ulcers with some concern for osteomyelitis on MRI.  Will discuss case with infectious disease.  General surgery and orthopedics stated no intervention from them at this time. -Wound care per [facility] consult (Dakin solution, moisture gauze packing, daily dressing changes) -Air mattress for offloading -Consider ID consult or IR

## 2024-01-02 NOTE — Assessment & Plan Note (Signed)
 Resolved

## 2024-01-02 NOTE — Assessment & Plan Note (Signed)
 MS: Stable.  Received of Ocrevus 600 mg in January.  Called neurologist Dr. Epimenio Foot who reported that a crevice can cause immunological suppression worsening sepsis.  He will follow-up with him after he is discharged and consider changing therapy.  He reports Ocrevus will stay in his system for 8 months. Trigeminal neuralgia: Continue Lamotrigine 100 mg morning and at lunch, 200 mg at night.  Continue pregabalin 150 mg TID. Depression: Continue escitalopram 10 mg daily.

## 2024-01-03 LAB — URINE CULTURE: Culture: >=100000 — AB

## 2024-01-03 LAB — CBC
HCT: 37.9 % — ABNORMAL LOW (ref 39.0–52.0)
Hemoglobin: 12.2 g/dL — ABNORMAL LOW (ref 13.0–17.0)
MCH: 29.6 pg (ref 26.0–34.0)
MCHC: 32.2 g/dL (ref 30.0–36.0)
MCV: 92 fL (ref 80.0–100.0)
Platelets: 233 10*3/uL (ref 150–400)
RBC: 4.12 MIL/uL — ABNORMAL LOW (ref 4.22–5.81)
RDW: 14.4 % (ref 11.5–15.5)
WBC: 11.6 10*3/uL — ABNORMAL HIGH (ref 4.0–10.5)
nRBC: 0 % (ref 0.0–0.2)

## 2024-01-03 LAB — BASIC METABOLIC PANEL
Anion gap: 7 (ref 5–15)
BUN: 10 mg/dL (ref 6–20)
CO2: 23 mmol/L (ref 22–32)
Calcium: 7.9 mg/dL — ABNORMAL LOW (ref 8.9–10.3)
Chloride: 113 mmol/L — ABNORMAL HIGH (ref 98–111)
Creatinine, Ser: 0.74 mg/dL (ref 0.61–1.24)
GFR, Estimated: >60 mL/min (ref >60)
Glucose, Bld: 83 mg/dL (ref 70–99)
Potassium: 3.4 mmol/L — ABNORMAL LOW (ref 3.5–5.1)
Sodium: 143 mmol/L (ref 135–145)

## 2024-01-03 MED ORDER — FOSFOMYCIN TROMETHAMINE 3 G PO PACK
3.0000 g | PACK | Freq: Once | ORAL | Status: AC
  Administered 2024-01-03: 3 g via ORAL
  Filled 2024-01-03: qty 3

## 2024-01-03 MED ORDER — ENSURE ENLIVE PO LIQD: 237.0000 mL | Freq: Two times a day (BID) | ORAL | 12 refills | Status: DC

## 2024-01-03 MED ORDER — GERHARDT'S BUTT CREAM
TOPICAL_CREAM | Freq: Two times a day (BID) | CUTANEOUS | Status: DC
  Filled 2024-01-03: qty 60

## 2024-01-03 MED ORDER — POTASSIUM CHLORIDE CRYS ER 20 MEQ PO TBCR
20.0000 meq | EXTENDED_RELEASE_TABLET | Freq: Once | ORAL | Status: AC
  Administered 2024-01-03: 20 meq via ORAL
  Filled 2024-01-03: qty 1

## 2024-01-03 NOTE — TOC Transition Note (Signed)
 Transition of Care [facility]) - Discharge Note Donn Pierini RN, BSN Transitions of Care Unit 4E- RN Case Manager See Treatment Team for direct phone #   Patient Details  Name: [name] MRN: [id number] Date of Birth: 1961/09/01  Transition of Care [facility]) CM/SW Contact:  Darrold Span, RN Phone Number: 01/03/2024, 12:10 PM   Clinical Narrative:    Pt stable for transition home today. Noted orders for HHPT/OT.  CM in to speak with pt and wife also present at bedside.  Per pt/wife pt does not want any HH services- declines referral for HHPT/OT at this time.. wife voiced that pt does not do the therapy when they have come in past.  Pt has all needed DME at home per wife. Wife has brought pt's electric w/c to transport home- staff to assist in getting pt into w/c for wife to transport pt in.   No further TOC needs noted.    Final next level of care: Home/Self Care Barriers to Discharge: No Barriers Identified   Patient Goals and CMS Choice Patient states their goals for this hospitalization and ongoing recovery are:: return home CMS Medicare.gov Compare Post Acute Care list provided to:: Patient Choice offered to / list presented to : Spouse, Patient      Discharge Placement               Home        Discharge Plan and Services Additional resources added to the After Visit Summary for     Discharge Planning Services: CM Consult Post Acute Care Choice: Home Health          DME Arranged: N/A DME Agency: NA       HH Arranged: PT, OT, Patient Refused HH HH Agency: NA        Social Drivers of Health (SDOH) Interventions SDOH Screenings   Food Insecurity: No Food Insecurity (12/30/2023)  Housing: Low Risk  (12/30/2023)  Transportation Needs: No Transportation Needs (12/30/2023)  Utilities: Not At Risk (12/30/2023)  Depression (PHQ2-9): Low Risk  (07/04/2020)  Social Connections: Unknown (07/07/2022)   [facility]  Tobacco  Use: Medium Risk (12/30/2023)     Readmission Risk Interventions    01/03/2024   12:10 PM 05/24/2023   11:51 AM 08/21/2022    1:23 PM  Readmission Risk Prevention Plan  Post Dischage Appt Complete  Complete  Medication Screening Complete  Complete  Transportation Screening Complete Complete Complete  PCP or Specialist Appt within 5-7 Days  Complete   Home Care Screening  Complete   Medication Review (RN CM)  Complete

## 2024-01-03 NOTE — Progress Notes (Signed)
 Discharge instructions reviewed with pt and his wife.  Copy of instructions given to pt/wife. Pt is returning home with his foley catheter, wife has changed catheter bag to a leg bag as she manages this at home for the pt.  Wife has gone to get pt's personal wheelchair from their [facility] Niece and will return and will assist in getting pt dressed. Pt's nurse provided wound care already this am, wife cares for pt's wounds at home and at wound clinic per wife.  Pt will be d/c'd via his wheelchair with belongings, with his wife and will be       escorted by staff.   Princessa Lesmeister,RN SWOT

## 2024-01-03 NOTE — Discharge Summary (Signed)
 Family Medicine Teaching [facility] Discharge Summary  Patient name: [name] Medical record number: [id number] Date of birth: 19-Nov-1960 Age: 63 y.o. Gender: male Date of Admission: 12/30/2023  Date of Discharge: 01/03/2024 Admitting Physician: Glendale Chard, DO  Primary Care Provider: [facility] Consultants: Infectious disease (Gen Surg, Ortho curbsided)  Indication for Hospitalization: Sepsis  Discharge Diagnoses/Problem List:  Principal Problem for Admission: UTI Other Problems addressed during stay:  Principal Problem:   UTI (urinary tract infection) Active Problems:   Pressure ulcer   Chronic health problem   Hypotension   Severe sepsis [facility])  Brief Hospital Course:  [name] is a 63 year old male with past medical history significant for multiple sclerosis, spastic quadriparesis, depression, trigeminal neuralgia, neurogenic bladder, chronic indwelling Foley with recurrent CAUTI, sacral pressure ulcers, and normocytic anemia.  Presented with encephalopathy and met SIRS criteria.  His hospital course is outlined below.  Sepsis  Urinary Tract Infection Patient initially presented with encephalopathy, sepsis thought to be secondary to urinary source (indwelling foley) vs sacral wounds.  U/A with moderate leukocytes and many bacteria, but was collected without foley catheter change.  Pt received aggressive fluid resuscitation initially. He received Azetreonam (3/20-3/21), Zosyn (3/20-3/23), and Vancomycin (3/20-3/23).  1/2 blood cultures grew Staph epidermidis with methicillin resistance (suspected contaminant), MRSA swab positive, and the above antibiotics were continued. Foley catheter was exchanged 3/21.  Urine culture grew pseudomonas and klebsiella.  Discussed case with infectious disease, who agreed with discontinuing vancomycin (no need for MRSA coverage) and Zosyn given 3 days of treatment.  Also agreed with fosfomycin x1 at discretion of primary  team, which patient received on day of discharge.  He returned home in stable condition.  Stage IV decubitus ulcers Skin exam revealed stage IV decubitus ulcers on sacrum.  MRI was obtained, which showed periostitis and trace endosteal edema and enhancement along the ischial tuberosities at the apex of the large stage IV decubitus ulcers, thought to be reactive or subtle osteomyelitis.  On 3/21 primary team spoke with general surgery and orthopedic surgery, who both declined consult as no surgical intervention was deemed necessary.  Wound care consult was placed, and recommendations were followed.  Did not pursue chronic antibiotics for ?osteomyelitis per ID recommendations.  Other chronic conditions were medically managed with home medications and formulary alternatives as necessary (multiple sclerosis, trigeminal neuralgia, depression)  Follow-up recommendations Please follow up on wound care plans and ensure continuing outpatient follow up. Consider Urology follow up for chronic indwelling Foley, not replaced this admission. Needs follow-up with neurology, Dr. Epimenio Foot.  Disposition: Home with [facility] PT/OT  Discharge Condition: Stable, doing well  Discharge Exam:  Vitals:   01/03/24 0800 01/03/24 0935  BP:  99/66  Pulse: 67 62  Resp: 18 17  Temp:  98.3 F (36.8 C)  SpO2:  98%   Physical Exam: General: Chronically ill appearing, deconditioned.  Resting in bed in no acute distress. Eyes: PERRLA. ENTM: MMM. Neck: No JVD. Cardiovascular: RRR.  No murmurs/rubs gallops. Respiratory: CTAB.  Normal work of breathing on room air. Gastrointestinal: No TTP in all quadrants. MSK: Decreased muscle mass, deconditioned, but equal strength all extremities. Extremities: No peripheral edema bilaterally.  2+ peripheral pulses. Derm: Stage IV decubitus ulcer dressed and without purulent drainage along lateral ischial spines. GU: Chronic indwelling Foley.  Mild erythema of perineum, no evidence of  intertrigo. Neuro: Alert and oriented x 4.  Quiet voice, but does respond appropriately questions.  Follows simple instructions. Psych: Pleasant, appropriate.  Full range affect.  Significant Procedures: None  Significant Labs and Imaging:  Recent Labs  Lab 01/02/24 0301 01/03/24 0327  WBC 14.2* 11.6*  HGB 11.7* 12.2*  HCT 36.1* 37.9*  PLT 231 233   Recent Labs  Lab 01/02/24 0301 01/03/24 0327  NA 142 143  K 3.5 3.4*  CL 111 113*  CO2 24 23  GLUCOSE 90 83  BUN 12 10  CREATININE 0.76 0.74  CALCIUM 8.0* 7.9*    - Urine culture 3/20: Pseudomonas aeruginosa, resistant to ciprofloxacin.  Klebsiella resistant to nitrofurantoin.  Results/Tests Pending at Time of Discharge: None  Discharge Medications:  Allergies as of 01/03/2024       Reactions   Gadopiclenol Nausea And Vomiting   Iodinated Contrast Media Nausea And Vomiting        Medication List     TAKE these medications    acetaminophen 500 MG tablet Commonly known as: TYLENOL Take 1,000 mg by mouth as needed for mild pain (pain score 1-3) or moderate pain (pain score 4-6).   albuterol 108 (90 Base) MCG/ACT inhaler Commonly known as: VENTOLIN HFA TAKE 2 PUFFS BY MOUTH EVERY 6 HOURS AS NEEDED What changed: See the new instructions.   ascorbic acid 500 MG tablet Commonly known as: VITAMIN C Take 1,000 mg by mouth daily.   baclofen 10 MG tablet Commonly known as: LIORESAL Take 10 mg by mouth daily as needed for muscle spasms.   CALCIUM 600 PO Take 1 capsule by mouth at bedtime.   cholecalciferol 25 MCG (1000 UNIT) tablet Commonly known as: VITAMIN D3 Take 1,000 Units by mouth at bedtime.   cyanocobalamin 500 MCG tablet Commonly known as: VITAMIN B12 Take 500 mcg by mouth daily.   escitalopram 10 MG tablet Commonly known as: LEXAPRO Take 1 tablet (10 mg total) by mouth daily.   feeding supplement Liqd Take 237 mLs by mouth 2 (two) times daily between meals.   ferrous sulfate 325 (65 FE) MG  EC tablet Take 1 tablet (325 mg total) by mouth 2 (two) times daily. What changed:  when to take this additional instructions   ibuprofen 200 MG tablet Commonly known as: ADVIL Take 600 mg by mouth as needed for headache or moderate pain.   ketoconazole 2 % shampoo Commonly known as: NIZORAL Apply 1 Application topically See admin instructions. SHAMPOO AS DIRECTED AFFECTED AREA THREE TIMES A WEEK TO THE SCALP IN THE SHOWER LEAVE ON FOR 5 MINUTES THEN RINSE OFF. TO THE SCALP IN THE SHOWER LEAVE ON FOR 5 MINUTES THEN RINSE OFF. Every 4 days   lamoTRIgine 100 MG tablet Commonly known as: LAMICTAL Take 100 mg by mouth in the morning, 100 mg at lunch and 200 mg by mouth at bedtime.   methenamine 1 g tablet Commonly known as: MANDELAMINE Take 1,000 mg by mouth 2 (two) times daily. Only takes this medication when not on an antibiotic.   modafinil 100 MG tablet Commonly known as: PROVIGIL Take 1 tablet by mouth every morning.   nystatin cream Commonly known as: MYCOSTATIN Apply 1 Application topically daily as needed for dry skin.   ocrelizumab 600 mg in sodium chloride 0.9 % 500 mL Inject 600 mg into the vein every 6 (six) months.   oxyCODONE-acetaminophen 5-325 MG tablet Commonly known as: Percocet Take 1 tablet by mouth every 12 (twelve) hours as needed for severe pain (pain score 7-10).   potassium chloride SA 20 MEQ tablet Commonly known as: KLOR-CON M Take 2 tablets (40  mEq total) by mouth daily.   pregabalin 150 MG capsule Commonly known as: LYRICA Take 1 capsule (150 mg total) by mouth 3 (three) times daily.   Santyl 250 UNIT/GM ointment Generic drug: collagenase Apply 1 Application topically daily.   THERATEARS OP Place 1 drop into both eyes 4 (four) times daily as needed (dry eye).        Discharge Instructions: Please refer to Patient Instructions section of EMR for full details.  Patient was counseled important signs and symptoms that should prompt return  to medical care, changes in medications, dietary instructions, activity restrictions, and follow up appointments.   Follow-Up Appointments: Future Appointments  Date Time Provider Department Center  01/13/2024  9:30 AM Camelia Phenes, DO [facility]  07/27/2024  8:30 AM Sater, Pearletha Furl, MD [facility] None    Sharion Dove, Karema Tocci, MD 01/03/2024, 2:53 PM PGY-1, [facility]

## 2024-01-04 LAB — CULTURE, BLOOD (ROUTINE X 2)
Culture: NO GROWTH
Special Requests: ADEQUATE

## 2024-01-13 ENCOUNTER — Encounter (HOSPITAL_BASED_OUTPATIENT_CLINIC_OR_DEPARTMENT_OTHER): Attending: Internal Medicine | Admitting: Internal Medicine

## 2024-01-13 DIAGNOSIS — G35 Multiple sclerosis: Secondary | ICD-10-CM | POA: Diagnosis not present

## 2024-01-13 DIAGNOSIS — Z6825 Body mass index (BMI) 25.0-25.9, adult: Secondary | ICD-10-CM | POA: Diagnosis not present

## 2024-01-13 DIAGNOSIS — L89324 Pressure ulcer of left buttock, stage 4: Secondary | ICD-10-CM | POA: Diagnosis not present

## 2024-01-13 DIAGNOSIS — L89314 Pressure ulcer of right buttock, stage 4: Secondary | ICD-10-CM | POA: Diagnosis present

## 2024-01-13 DIAGNOSIS — E440 Moderate protein-calorie malnutrition: Secondary | ICD-10-CM | POA: Insufficient documentation

## 2024-01-13 DIAGNOSIS — Z993 Dependence on wheelchair: Secondary | ICD-10-CM | POA: Insufficient documentation

## 2024-01-13 DIAGNOSIS — N319 Neuromuscular dysfunction of bladder, unspecified: Secondary | ICD-10-CM | POA: Diagnosis not present

## 2024-01-19 ENCOUNTER — Other Ambulatory Visit: Payer: Self-pay | Admitting: *Deleted

## 2024-01-19 ENCOUNTER — Encounter: Payer: Self-pay | Admitting: Neurology

## 2024-01-19 MED ORDER — PREGABALIN 150 MG PO CAPS: 150.0000 mg | ORAL_CAPSULE | Freq: Three times a day (TID) | ORAL | 5 refills | Status: DC

## 2024-01-19 NOTE — Telephone Encounter (Signed)
 Prescription signed by provider and faxed to [facility]

## 2024-02-14 ENCOUNTER — Encounter (HOSPITAL_BASED_OUTPATIENT_CLINIC_OR_DEPARTMENT_OTHER): Attending: Internal Medicine | Admitting: Internal Medicine

## 2024-02-14 ENCOUNTER — Encounter: Payer: Self-pay | Admitting: Neurology

## 2024-02-14 DIAGNOSIS — G35 Multiple sclerosis: Secondary | ICD-10-CM | POA: Insufficient documentation

## 2024-02-14 DIAGNOSIS — E440 Moderate protein-calorie malnutrition: Secondary | ICD-10-CM | POA: Insufficient documentation

## 2024-02-14 DIAGNOSIS — L89314 Pressure ulcer of right buttock, stage 4: Secondary | ICD-10-CM | POA: Insufficient documentation

## 2024-02-14 DIAGNOSIS — L89324 Pressure ulcer of left buttock, stage 4: Secondary | ICD-10-CM | POA: Diagnosis not present

## 2024-02-14 DIAGNOSIS — N319 Neuromuscular dysfunction of bladder, unspecified: Secondary | ICD-10-CM | POA: Diagnosis not present

## 2024-02-14 MED ORDER — ESCITALOPRAM OXALATE 10 MG PO TABS: 10.0000 mg | ORAL_TABLET | Freq: Every day | ORAL | 1 refills | Status: DC

## 2024-02-14 NOTE — Telephone Encounter (Signed)
 Last seen on 12/28/23 Follow up scheduled on 07/27/24

## 2024-03-20 ENCOUNTER — Encounter (HOSPITAL_BASED_OUTPATIENT_CLINIC_OR_DEPARTMENT_OTHER): Attending: Internal Medicine | Admitting: Internal Medicine

## 2024-03-20 DIAGNOSIS — L89314 Pressure ulcer of right buttock, stage 4: Secondary | ICD-10-CM | POA: Insufficient documentation

## 2024-03-20 DIAGNOSIS — G35 Multiple sclerosis: Secondary | ICD-10-CM | POA: Diagnosis not present

## 2024-03-20 DIAGNOSIS — L89324 Pressure ulcer of left buttock, stage 4: Secondary | ICD-10-CM | POA: Diagnosis not present

## 2024-03-20 DIAGNOSIS — E440 Moderate protein-calorie malnutrition: Secondary | ICD-10-CM | POA: Insufficient documentation

## 2024-03-20 DIAGNOSIS — N319 Neuromuscular dysfunction of bladder, unspecified: Secondary | ICD-10-CM | POA: Insufficient documentation

## 2024-04-02 ENCOUNTER — Emergency Department (HOSPITAL_COMMUNITY)

## 2024-04-02 ENCOUNTER — Inpatient Hospital Stay (HOSPITAL_COMMUNITY)
Admission: EM | Admit: 2024-04-02 | Discharge: 2024-04-04 | DRG: 698 | Disposition: A | Discharge status: Home Health | Attending: Internal Medicine | Admitting: Internal Medicine

## 2024-04-02 DIAGNOSIS — A419 Sepsis, unspecified organism: Principal | ICD-10-CM

## 2024-04-02 DIAGNOSIS — F32A Depression, unspecified: Secondary | ICD-10-CM

## 2024-04-02 DIAGNOSIS — Z809 Family history of malignant neoplasm, unspecified: Secondary | ICD-10-CM

## 2024-04-02 DIAGNOSIS — R1319 Other dysphagia: Secondary | ICD-10-CM | POA: Diagnosis present

## 2024-04-02 DIAGNOSIS — R509 Fever, unspecified: Secondary | ICD-10-CM | POA: Diagnosis not present

## 2024-04-02 DIAGNOSIS — Z820 Family history of epilepsy and other diseases of the nervous system: Secondary | ICD-10-CM | POA: Diagnosis not present

## 2024-04-02 DIAGNOSIS — Z66 Do not resuscitate: Secondary | ICD-10-CM | POA: Diagnosis present

## 2024-04-02 DIAGNOSIS — N390 Urinary tract infection, site not specified: Secondary | ICD-10-CM

## 2024-04-02 DIAGNOSIS — T83511A Infection and inflammatory reaction due to indwelling urethral catheter, initial encounter: Secondary | ICD-10-CM | POA: Diagnosis present

## 2024-04-02 DIAGNOSIS — G500 Trigeminal neuralgia: Secondary | ICD-10-CM

## 2024-04-02 DIAGNOSIS — Y846 Urinary catheterization as the cause of abnormal reaction of the patient, or of later complication, without mention of misadventure at the time of the procedure: Secondary | ICD-10-CM | POA: Diagnosis present

## 2024-04-02 DIAGNOSIS — L8993 Pressure ulcer of unspecified site, stage 3: Secondary | ICD-10-CM

## 2024-04-02 DIAGNOSIS — G35C1 Active secondary progressive multiple sclerosis: Secondary | ICD-10-CM | POA: Diagnosis present

## 2024-04-02 DIAGNOSIS — G9341 Metabolic encephalopathy: Secondary | ICD-10-CM

## 2024-04-02 DIAGNOSIS — M160 Bilateral primary osteoarthritis of hip: Secondary | ICD-10-CM | POA: Diagnosis not present

## 2024-04-02 DIAGNOSIS — Z87891 Personal history of nicotine dependence: Secondary | ICD-10-CM | POA: Diagnosis not present

## 2024-04-02 DIAGNOSIS — Z888 Allergy status to other drugs, medicaments and biological substances status: Secondary | ICD-10-CM

## 2024-04-02 DIAGNOSIS — D84821 Immunodeficiency due to drugs: Secondary | ICD-10-CM | POA: Diagnosis present

## 2024-04-02 DIAGNOSIS — R0989 Other specified symptoms and signs involving the circulatory and respiratory systems: Secondary | ICD-10-CM | POA: Diagnosis not present

## 2024-04-02 DIAGNOSIS — Z87440 Personal history of urinary (tract) infections: Secondary | ICD-10-CM | POA: Diagnosis not present

## 2024-04-02 DIAGNOSIS — N319 Neuromuscular dysfunction of bladder, unspecified: Secondary | ICD-10-CM

## 2024-04-02 DIAGNOSIS — D72829 Elevated white blood cell count, unspecified: Secondary | ICD-10-CM | POA: Diagnosis not present

## 2024-04-02 DIAGNOSIS — L89314 Pressure ulcer of right buttock, stage 4: Secondary | ICD-10-CM | POA: Diagnosis present

## 2024-04-02 DIAGNOSIS — L89324 Pressure ulcer of left buttock, stage 4: Secondary | ICD-10-CM | POA: Diagnosis present

## 2024-04-02 DIAGNOSIS — K5900 Constipation, unspecified: Secondary | ICD-10-CM | POA: Diagnosis not present

## 2024-04-02 DIAGNOSIS — Z91041 Radiographic dye allergy status: Secondary | ICD-10-CM

## 2024-04-02 DIAGNOSIS — E876 Hypokalemia: Secondary | ICD-10-CM

## 2024-04-02 DIAGNOSIS — Z7401 Bed confinement status: Secondary | ICD-10-CM

## 2024-04-02 DIAGNOSIS — R1310 Dysphagia, unspecified: Secondary | ICD-10-CM | POA: Diagnosis not present

## 2024-04-02 DIAGNOSIS — A4159 Other Gram-negative sepsis: Secondary | ICD-10-CM | POA: Diagnosis present

## 2024-04-02 DIAGNOSIS — Z79899 Other long term (current) drug therapy: Secondary | ICD-10-CM | POA: Diagnosis not present

## 2024-04-02 DIAGNOSIS — L98429 Non-pressure chronic ulcer of back with unspecified severity: Secondary | ICD-10-CM

## 2024-04-02 DIAGNOSIS — G35 Multiple sclerosis: Secondary | ICD-10-CM

## 2024-04-02 DIAGNOSIS — Z1152 Encounter for screening for COVID-19: Secondary | ICD-10-CM | POA: Diagnosis not present

## 2024-04-02 DIAGNOSIS — R5381 Other malaise: Secondary | ICD-10-CM | POA: Diagnosis not present

## 2024-04-02 DIAGNOSIS — Z801 Family history of malignant neoplasm of trachea, bronchus and lung: Secondary | ICD-10-CM

## 2024-04-02 DIAGNOSIS — Z7960 Long term (current) use of unspecified immunomodulators and immunosuppressants: Secondary | ICD-10-CM | POA: Diagnosis not present

## 2024-04-02 DIAGNOSIS — J9811 Atelectasis: Secondary | ICD-10-CM | POA: Diagnosis not present

## 2024-04-02 DIAGNOSIS — L8990 Pressure ulcer of unspecified site, unspecified stage: Secondary | ICD-10-CM | POA: Diagnosis present

## 2024-04-02 LAB — URINALYSIS, W/ REFLEX TO CULTURE (INFECTION SUSPECTED)
Bilirubin Urine: NEGATIVE
Glucose, UA: NEGATIVE mg/dL
Hgb urine dipstick: NEGATIVE
Ketones, ur: 5 mg/dL — AB
Nitrite: POSITIVE — AB
Protein, ur: 100 mg/dL — AB
Specific Gravity, Urine: 1.02 (ref 1.005–1.030)
WBC, UA: >50 WBC/hpf (ref 0–5)
pH: 5 (ref 5.0–8.0)

## 2024-04-02 LAB — I-STAT CG4 LACTIC ACID, ED: Lactic Acid, Venous: 0.7 mmol/L (ref 0.5–1.9)

## 2024-04-02 LAB — CBC WITH DIFFERENTIAL/PLATELET
Abs Immature Granulocytes: 0.04 10*3/uL (ref 0.00–0.07)
Basophils Absolute: 0 10*3/uL (ref 0.0–0.1)
Basophils Relative: 0 %
Eosinophils Absolute: 0.1 10*3/uL (ref 0.0–0.5)
Eosinophils Relative: 1 %
HCT: 44.6 % (ref 39.0–52.0)
Hemoglobin: 14.4 g/dL (ref 13.0–17.0)
Immature Granulocytes: 0 %
Lymphocytes Relative: 5 %
Lymphs Abs: 0.5 10*3/uL — ABNORMAL LOW (ref 0.7–4.0)
MCH: 29.4 pg (ref 26.0–34.0)
MCHC: 32.3 g/dL (ref 30.0–36.0)
MCV: 91 fL (ref 80.0–100.0)
Monocytes Absolute: 0.7 10*3/uL (ref 0.1–1.0)
Monocytes Relative: 7 %
Neutro Abs: 9.3 10*3/uL — ABNORMAL HIGH (ref 1.7–7.7)
Neutrophils Relative %: 87 %
Platelets: 230 10*3/uL (ref 150–400)
RBC: 4.9 MIL/uL (ref 4.22–5.81)
RDW: 14.9 % (ref 11.5–15.5)
WBC: 10.7 10*3/uL — ABNORMAL HIGH (ref 4.0–10.5)
nRBC: 0 % (ref 0.0–0.2)

## 2024-04-02 LAB — COMPREHENSIVE METABOLIC PANEL WITH GFR
ALT: 11 U/L (ref 0–44)
AST: 12 U/L — ABNORMAL LOW (ref 15–41)
Albumin: 3.8 g/dL (ref 3.5–5.0)
Alkaline Phosphatase: 92 U/L (ref 38–126)
Anion gap: 10 (ref 5–15)
BUN: 17 mg/dL (ref 6–20)
CO2: 25 mmol/L (ref 22–32)
Calcium: 9.3 mg/dL (ref 8.9–10.3)
Chloride: 106 mmol/L (ref 98–111)
Creatinine, Ser: 0.7 mg/dL (ref 0.61–1.24)
GFR, Estimated: >60 mL/min (ref >60)
Glucose, Bld: 96 mg/dL (ref 70–99)
Potassium: 3.4 mmol/L — ABNORMAL LOW (ref 3.5–5.1)
Sodium: 141 mmol/L (ref 135–145)
Total Bilirubin: 0.5 mg/dL (ref 0.0–1.2)
Total Protein: 5.6 g/dL — ABNORMAL LOW (ref 6.5–8.1)

## 2024-04-02 LAB — C-REACTIVE PROTEIN: CRP: 5.1 mg/dL — ABNORMAL HIGH (ref <1.0)

## 2024-04-02 LAB — RESP PANEL BY RT-PCR (RSV, FLU A&B, COVID)  RVPGX2
Influenza A by PCR: NEGATIVE
Influenza B by PCR: NEGATIVE
Resp Syncytial Virus by PCR: NEGATIVE
SARS Coronavirus 2 by RT PCR: NEGATIVE

## 2024-04-02 LAB — SEDIMENTATION RATE: Sed Rate: 7 mm/h (ref 0–16)

## 2024-04-02 MED ORDER — IBUPROFEN 200 MG PO TABS: 600.0000 mg | ORAL_TABLET | Freq: Four times a day (QID) | ORAL | Status: DC | PRN

## 2024-04-02 MED ORDER — SODIUM CHLORIDE 0.9 % IV BOLUS
500.0000 mL | Freq: Once | INTRAVENOUS | Status: AC
  Administered 2024-04-02: 500 mL via INTRAVENOUS

## 2024-04-02 MED ORDER — OXYCODONE-ACETAMINOPHEN 5-325 MG PO TABS
1.0000 | ORAL_TABLET | Freq: Two times a day (BID) | ORAL | Status: DC | PRN
  Administered 2024-04-02: 1 via ORAL
  Filled 2024-04-02: qty 1

## 2024-04-02 MED ORDER — PREGABALIN 25 MG PO CAPS: 150.0000 mg | ORAL_CAPSULE | Freq: Three times a day (TID) | ORAL | Status: DC

## 2024-04-02 MED ORDER — ENOXAPARIN SODIUM 40 MG/0.4ML IJ SOSY
40.0000 mg | PREFILLED_SYRINGE | INTRAMUSCULAR | Status: DC
  Administered 2024-04-02 – 2024-04-03 (×2): 40 mg via SUBCUTANEOUS
  Filled 2024-04-02 (×2): qty 0.4

## 2024-04-02 MED ORDER — LAMOTRIGINE 25 MG PO TABS: 100.0000 mg | ORAL_TABLET | ORAL | Status: DC

## 2024-04-02 MED ORDER — SODIUM CHLORIDE 0.9 % IV SOLN
2.0000 g | Freq: Once | INTRAVENOUS | Status: AC
  Administered 2024-04-02: 2 g via INTRAVENOUS
  Filled 2024-04-02: qty 20

## 2024-04-02 MED ORDER — ACETAMINOPHEN 650 MG RE SUPP: 650.0000 mg | Freq: Four times a day (QID) | RECTAL | Status: DC | PRN

## 2024-04-02 MED ORDER — VITAMIN C 500 MG PO TABS
1000.0000 mg | ORAL_TABLET | Freq: Every day | ORAL | Status: DC
  Administered 2024-04-02 – 2024-04-04 (×3): 1000 mg via ORAL
  Filled 2024-04-02 (×3): qty 2

## 2024-04-02 MED ORDER — ONDANSETRON HCL 4 MG PO TABS: 4.0000 mg | ORAL_TABLET | Freq: Four times a day (QID) | ORAL | Status: DC | PRN

## 2024-04-02 MED ORDER — CHLORHEXIDINE GLUCONATE CLOTH 2 % EX PADS
6.0000 | MEDICATED_PAD | Freq: Every day | CUTANEOUS | Status: DC
  Administered 2024-04-03 – 2024-04-04 (×2): 6 via TOPICAL

## 2024-04-02 MED ORDER — ACETAMINOPHEN 650 MG RE SUPP
650.0000 mg | RECTAL | Status: AC
  Administered 2024-04-02: 650 mg via RECTAL
  Filled 2024-04-02: qty 1

## 2024-04-02 MED ORDER — FOOD THICKENER (SIMPLYTHICK)
1.0000 | ORAL | Status: DC | PRN
  Filled 2024-04-02: qty 1

## 2024-04-02 MED ORDER — VANCOMYCIN HCL IN DEXTROSE 1-5 GM/200ML-% IV SOLN
1000.0000 mg | Freq: Once | INTRAVENOUS | Status: DC
  Filled 2024-04-02: qty 200

## 2024-04-02 MED ORDER — LAMOTRIGINE 100 MG PO TABS
100.0000 mg | ORAL_TABLET | Freq: Two times a day (BID) | ORAL | Status: DC
  Administered 2024-04-02 – 2024-04-04 (×5): 100 mg via ORAL
  Filled 2024-04-02: qty 4
  Filled 2024-04-02 (×4): qty 1

## 2024-04-02 MED ORDER — BACLOFEN 10 MG PO TABS: 10.0000 mg | ORAL_TABLET | Freq: Every day | ORAL | Status: DC | PRN

## 2024-04-02 MED ORDER — SODIUM CHLORIDE 0.9 % IV SOLN
2.0000 g | Freq: Two times a day (BID) | INTRAVENOUS | Status: DC
  Administered 2024-04-02 – 2024-04-04 (×5): 2 g via INTRAVENOUS
  Filled 2024-04-02 (×5): qty 12.5

## 2024-04-02 MED ORDER — ALBUTEROL SULFATE (2.5 MG/3ML) 0.083% IN NEBU: 2.5000 mg | INHALATION_SOLUTION | Freq: Four times a day (QID) | RESPIRATORY_TRACT | Status: DC | PRN

## 2024-04-02 MED ORDER — LAMOTRIGINE 100 MG PO TABS
200.0000 mg | ORAL_TABLET | Freq: Every day | ORAL | Status: DC
  Administered 2024-04-02 – 2024-04-03 (×2): 200 mg via ORAL
  Filled 2024-04-02 (×2): qty 2

## 2024-04-02 MED ORDER — SODIUM CHLORIDE 0.9% FLUSH
3.0000 mL | Freq: Two times a day (BID) | INTRAVENOUS | Status: DC
  Administered 2024-04-02 – 2024-04-04 (×5): 3 mL via INTRAVENOUS

## 2024-04-02 MED ORDER — DAKINS (1/4 STRENGTH) 0.125 % EX SOLN
Freq: Every day | CUTANEOUS | Status: DC
  Filled 2024-04-02: qty 473

## 2024-04-02 MED ORDER — POTASSIUM CHLORIDE CRYS ER 20 MEQ PO TBCR
20.0000 meq | EXTENDED_RELEASE_TABLET | ORAL | Status: AC
  Administered 2024-04-02: 20 meq via ORAL
  Filled 2024-04-02: qty 1

## 2024-04-02 MED ORDER — MODAFINIL 100 MG PO TABS
100.0000 mg | ORAL_TABLET | Freq: Every morning | ORAL | Status: DC
  Administered 2024-04-02 – 2024-04-04 (×3): 100 mg via ORAL
  Filled 2024-04-02 (×3): qty 1

## 2024-04-02 MED ORDER — ONDANSETRON HCL 4 MG/2ML IJ SOLN: 4.0000 mg | Freq: Four times a day (QID) | INTRAMUSCULAR | Status: DC | PRN

## 2024-04-02 MED ORDER — VANCOMYCIN HCL 1500 MG/300ML IV SOLN
1500.0000 mg | Freq: Once | INTRAVENOUS | Status: AC
  Administered 2024-04-02: 1500 mg via INTRAVENOUS
  Filled 2024-04-02: qty 300

## 2024-04-02 MED ORDER — ESCITALOPRAM OXALATE 10 MG PO TABS
10.0000 mg | ORAL_TABLET | Freq: Every day | ORAL | Status: DC
  Administered 2024-04-03 – 2024-04-04 (×2): 10 mg via ORAL
  Filled 2024-04-02 (×2): qty 1

## 2024-04-02 MED ORDER — ACETAMINOPHEN 325 MG PO TABS: 650.0000 mg | ORAL_TABLET | Freq: Four times a day (QID) | ORAL | Status: DC | PRN

## 2024-04-02 MED ORDER — SODIUM CHLORIDE 0.9 % IV SOLN: INTRAVENOUS | Status: DC

## 2024-04-02 MED ORDER — PREGABALIN 75 MG PO CAPS
150.0000 mg | ORAL_CAPSULE | Freq: Three times a day (TID) | ORAL | Status: DC
  Administered 2024-04-02 – 2024-04-04 (×7): 150 mg via ORAL
  Filled 2024-04-02 (×7): qty 2

## 2024-04-02 NOTE — Plan of Care (Signed)

## 2024-04-02 NOTE — Sepsis Progress Note (Addendum)
 Elink following code sepsis  0826 messaged MD to notify only single blood culture ordered, MD will order second set, bedside RN included in message

## 2024-04-02 NOTE — H&P (Signed)
 History and Physical    Patient: [name] FMW:[id number] DOB: Jan 24, 1961 DOA: 04/02/2024 DOS: the patient was seen and examined on 04/02/2024 PCP: [facility]  Patient coming from: Home via EMS  Chief Complaint:  Chief Complaint  Patient presents with   Fatigue   Fever   HPI: [name] is a 63 y.o. 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  He is accompanied by his wife who helps provide most of history.  He experiences lethargy, difficulty carrying on a conversation, and neck stiffness and rigidity which were telltale signs that he was ill.  She reports similar symptoms when he has had a urinary tract infection previously in the past.  He has a history of multiple sclerosis and is on a modified diet of regular food with nectar-thick liquids due to swallowing difficulties. He cannot feed himself or perform activities of daily living independently for which his wife is his primary caregiver.  He has a history of trigeminal neuralgia, for which he takes pregabalin  150 mg three times daily and lamotrigine  with varying doses throughout the day which she wants to make sure he keeps on schedule for.  He has been dealing with bed sores for over a year, which are being managed at the wound care center. The sores have improved significantly with treatment, which includes packing with Dakin's solution. He takes vitamin C  to aid in healing the sores.   He has had no nausea or vomiting. He had a bowel movement yesterday afternoon.   Patient was noted to be febrile up to 100.4 F rectally with soft blood pressures 95/61 until 109/62, and all other vital signs maintained.  Labs noted WBC 10.7, potassium 3.4, CRP 5.1, and lactic acid 0.7.  Chest x-ray  noted mild bibasilar atelectasis.  X-rays of the pelvis did not reveal any acute abnormality.  CT scan of the abdomen pelvis was obtained due to concern for cellulitis with possible deep infection of right buttock pressure ulcer which revealed soft tissue deficits deep to the bilateral ischium with possible associated osteomyelitis.  Urinalysis was positive moderate leukocytes, positive nitrites, many bacteria, and greater than 50 WBCs.  He has been given Tylenol  650 mg rectally, vancomycin , cefepime  due to concern for cellulitis initially.  After urinalysis that resulted cefepime  was added on due to history of resistant bacteria.  Foley catheter have been exchanged while in the ED.   Review of Systems: As mentioned in the history of present illness. All other systems reviewed and are negative. 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 His smoking use included cigarettes. He has been exposed to tobacco smoke. He has never used smokeless tobacco. He reports current drug use. Drug: Oxycodone . He reports that he does not drink alcohol .  Allergies  Allergen Reactions   Gadopiclenol  Nausea And Vomiting   Iodinated Contrast Media Nausea And Vomiting    Family History  Problem Relation Age of Onset   Cancer Father    Multiple sclerosis Sister    Seizures Maternal Uncle    Parkinsonism Maternal Uncle    Multiple sclerosis Sister    Multiple sclerosis Paternal Uncle    Multiple sclerosis Other    Lung cancer Other        parent   Uterine cancer Other        other     Prior to Admission medications   Medication Sig Start Date End Date Taking? Authorizing Provider  acetaminophen  (TYLENOL ) 500 MG tablet Take 1,000 mg by mouth as needed for mild pain (pain score 1-3) or moderate pain (pain score 4-6).    [provider]  albuterol  (VENTOLIN  HFA) 108 (90 Base) MCG/ACT inhaler TAKE 2 PUFFS BY MOUTH EVERY 6 HOURS AS NEEDED Patient taking differently: Inhale 2 puffs into the lungs every 6 (six) hours as needed for wheezing or shortness of breath. 12/15/23   Sater, Charlie LABOR, MD  ascorbic acid  (VITAMIN C ) 500 MG tablet Take 1,000 mg by mouth daily.    [provider]  baclofen  (LIORESAL ) 10 MG tablet Take 10 mg by mouth daily as needed for muscle spasms. 07/30/22   [provider]  Calcium Carbonate (CALCIUM 600 PO) Take 1 capsule by mouth at bedtime.    [provider]  Carboxymethylcellulose Sodium (THERATEARS OP) Place 1 drop into both eyes 4 (four) times daily as needed (dry eye).    [provider]  cholecalciferol  (VITAMIN D3) 25 MCG (1000 UNIT) tablet Take 1,000 Units by mouth at bedtime.    [provider]  collagenase  (SANTYL ) 250 UNIT/GM ointment Apply 1 Application topically daily. 06/29/23   Sater, Charlie LABOR, MD  cyanocobalamin  (VITAMIN B12) 500 MCG tablet Take 500 mcg by mouth daily.    [provider]  escitalopram  (LEXAPRO ) 10 MG tablet Take 1 tablet (10 mg total) by mouth daily. 02/14/24   Sater, Charlie LABOR, MD  feeding supplement (ENSURE ENLIVE / ENSURE PLUS) LIQD Take 237 mLs by mouth 2 (two) times daily between meals. 01/03/24   Rosendo Rush, MD  ferrous sulfate  325 (65 FE) MG EC tablet Take 1 tablet (325 mg total) by mouth 2 (two) times daily. Patient taking differently: Take 325 mg by mouth once a week. On Fridays 05/24/23 12/27/24  Gonfa, Taye T, MD  ibuprofen  (ADVIL ) 200 MG tablet Take 600 mg by mouth as needed for headache or moderate pain.    [provider]  ketoconazole  (NIZORAL) 2 % shampoo Apply 1 Application topically See admin instructions. SHAMPOO AS DIRECTED AFFECTED AREA THREE TIMES A WEEK TO THE SCALP IN THE SHOWER LEAVE ON FOR 5 MINUTES THEN RINSE OFF. TO THE SCALP IN THE SHOWER LEAVE ON FOR 5 MINUTES THEN RINSE OFF. Every 4 days    [provider]  lamoTRIgine  (LAMICTAL ) 100 MG tablet Take 100 mg by mouth in the morning, 100 mg at lunch and 200 mg by mouth at bedtime. 12/01/23   Sater, Charlie LABOR, MD  methenamine (MANDELAMINE) 1 g tablet Take 1,000 mg by mouth 2 (two) times daily. Only takes this medication when not on an antibiotic.    [provider]  modafinil  (PROVIGIL ) 100 MG tablet Take 1 tablet by mouth every morning. 12/28/23   [provider]  nystatin cream (MYCOSTATIN) Apply 1  Application topically daily as needed for dry skin.    [provider]  ocrelizumab 600 mg in sodium chloride  0.9 % 500 mL Inject 600 mg into the vein every 6 (six) months.     [provider]  oxyCODONE -acetaminophen  (PERCOCET) 5-325 MG tablet Take 1 tablet by mouth every 12 (twelve) hours as needed for severe pain (pain score 7-10). 11/01/23 10/31/24  Sater, Charlie LABOR, MD  potassium chloride  SA (KLOR-CON  M) 20 MEQ tablet Take 2 tablets (40 mEq total) by mouth daily. Patient not taking: Reported on 12/30/2023 10/24/22 12/27/24  Mapp, Tavien, MD  pregabalin  (LYRICA ) 150 MG capsule Take 1 capsule (150 mg total) by mouth 3 (three) times daily. 01/19/24   Vear Charlie LABOR, MD    Physical Exam: Vitals:   04/02/24 0815 04/02/24 0830 04/02/24 0845 04/02/24 0900  BP: 104/61 102/60 95/61 (!) 101/58  Pulse: 64 68 69 69  Resp: 19 18 19 18   Temp:      TempSrc:      SpO2: 100% 99% 91% 93%  Weight:      Height:        Constitutional: [name] ill-appearing male currently in no acute distress Eyes: PERRL, lids and conjunctivae normal ENMT: Mucous membranes are moist. Posterior pharynx clear of any exudate or lesions.Normal dentition.  Neck:  normal, supple, no masses, no thyromegaly Respiratory: clear to auscultation bilaterally, no wheezing, no crackles. Normal respiratory effort. No accessory muscle use.  Cardiovascular: Regular rate and rhythm, no murmurs / rubs / gallops. No extremity edema. 2+ pedal pulses.  Abdomen: Protuberant abdomen with bowel sounds appreciated.    GU: Foley catheter in place Musculoskeletal: no clubbing / cyanosis. No joint deformity upper and lower extremities.   Skin: Wounds noted of the right and left buttock.  Right wound appears to be stage III but Neurologic: CN 2-12 grossly intact.  Limited use of upper and lower extremities. Psychiatric: Normal judgment and insight. Alert and oriented x 3.  Flat affect.  Data Reviewed:   reviewed labs, imaging, and pertinent records as documented.  Assessment and Plan:  Suspected catheter associated urinary tract infection Neurogenic bladder Acute.  Patient reports that patient was more lethargic with increased rigidity concerning her for infection.  Urinalysis was positive moderate leukocytes, positive nitrites, many bacteria, and greater than 50 WBCs.  Patient was given empiric antibiotics of Rocephin , vancomycin , and was later switched to cefepime  due to prior history of resistant bacteria.  Foley catheter has been changed out in the ED. - Admit to medical telemetry bed - Follow-up urine culture - Continue empiric antibiotics with cefepime .  Adjust as deemed medically appropriate. - Tylenol  as needed for fever  Leukocytosis Acute.  WBC elevated at 10.7 and CRP was elevated at 5.1.  Patient was reported to have a rectal temperature of 100.4, but did not meet sepsis criteria. - Recheck CBC tomorrow morning.  Acute metabolic encephalopathy Patient was reported to be more lethargic and less responsive with increased rigidity.  At baseline does not have those symptoms which wife thought this was likely related to urinary tract infection. -  Neurochecks  Hypokalemia Acute.  Initial potassium noted to be 3.4. - Give potassium chloride  20 mill equivalents p.o. x 1 dose - Continue to monitor and replace as needed  Chronic pressure ulcers Patient has chronic pressure ulcers of the buttock for which wife notes that he has been going to wound care and they have been improving.  Initial concern was for possible cellulitis or deep  tissue infection.  However CT imaging noted concern for soft tissue deficits deep to the bilateral ischium with possible associated osteomyelitis.  ESR was within normal limits at 7 which seems less likely. - Low-air-loss mattress replaced - Wound care consulted  Multiple sclerosis Dysphagia Debility Patient has significant multiple sclerosis that is debilitating with dysphagia needing assistance with feeding and is bedbound.  He is on nectar thick liquids.  He receives Ocrevus injections  - Aspiration precautions with elevation head of bed - Orders placed for home meds with applesauce - Continue baclofen  as needed  Trigeminal neuralgia - Continue Lyrica , Lamictal   Depression - Continue Lexapro   DVT prophylaxis:  Advance Care Planning:   Code Status: Limited: Do not attempt resuscitation (DNR) -DNR-LIMITED -Do Not Intubate/DNI    Consults: Wound care  Family Communication: Patient's wife updated at bedside  Severity of Illness: The appropriate patient status for this patient is INPATIENT. Inpatient status is judged to be reasonable and necessary in order to provide the required intensity of service to ensure the patient's safety. The patient's presenting symptoms, physical exam findings, and initial radiographic and laboratory data in the context of their chronic comorbidities is felt to place them at high risk for further clinical deterioration. Furthermore, it is not anticipated that the patient will be medically stable for discharge from the hospital within 2 midnights of admission.   * I certify  that at the point of admission it is my clinical judgment that the patient will require inpatient hospital care spanning beyond 2 midnights from the point of admission due to high intensity of service, high risk for further deterioration and high frequency of surveillance required.*  Author: Maximino DELENA Sharps, MD 04/02/2024 10:15 AM  For on call review [email or web address].

## 2024-04-02 NOTE — ED Notes (Signed)
 First set of blood cultures obtained and sent to main lab from left forearm

## 2024-04-02 NOTE — ED Provider Notes (Signed)
 [facility] AT [facility] Provider Note   CSN: [id number] Arrival date & time: 04/02/24  9356     Patient presents with: Fatigue and Fever   JEBEDIAH MACRAE is a 63 y.o. male.   63 year old male with a history of MS who is bedbound and nonverbal who presents to the emergency department with fever and weakness.  History obtained per EMS who reports that patient has been less responsive and weaker than usual over the past 2 days.  Patient is able to communicate through nodding and shaking his head but says that he is also had a runny nose and a cough along with his fever.  Has had nausea but no vomiting.  Per EMS is at his mental baseline otherwise.  Does have a history of UTIs from his indwelling Foley catheter from his neurogenic bladder.  Also does have some chronic sacral wounds.       Prior to Admission medications   Medication Sig Start Date End Date Taking? Authorizing Provider  acetaminophen  (TYLENOL ) 500 MG tablet Take 1,000 mg by mouth as needed for mild pain (pain score 1-3) or moderate pain (pain score 4-6).    [provider]  albuterol  (VENTOLIN  HFA) 108 (90 Base) MCG/ACT inhaler TAKE 2 PUFFS BY MOUTH EVERY 6 HOURS AS NEEDED Patient taking differently: Inhale 2 puffs into the lungs every 6 (six) hours as needed for wheezing or shortness of breath. 12/15/23   Sater, Charlie LABOR, MD  ascorbic acid  (VITAMIN C ) 500 MG tablet Take 1,000 mg by mouth daily.    [provider]  baclofen  (LIORESAL ) 10 MG tablet Take 10 mg by mouth daily as needed for muscle spasms. 07/30/22   [provider]  Calcium Carbonate (CALCIUM 600 PO) Take 1 capsule by mouth at bedtime.    [provider]  Carboxymethylcellulose Sodium (THERATEARS OP) Place 1 drop into both eyes 4 (four) times daily as needed (dry eye).    [provider]  cholecalciferol  (VITAMIN D3) 25 MCG (1000 UNIT) tablet Take 1,000 Units by mouth at bedtime.     [provider]  collagenase  (SANTYL ) 250 UNIT/GM ointment Apply 1 Application topically daily. 06/29/23   Sater, Charlie LABOR, MD  cyanocobalamin  (VITAMIN B12) 500 MCG tablet Take 500 mcg by mouth daily.    [provider]  escitalopram  (LEXAPRO ) 10 MG tablet Take 1 tablet (10 mg total) by mouth daily. 02/14/24   Sater, Charlie LABOR, MD  feeding supplement (ENSURE ENLIVE / ENSURE PLUS) LIQD Take 237 mLs by mouth 2 (two) times daily between meals. 01/03/24   Rosendo Rush, MD  ferrous sulfate  325 (65 FE) MG EC tablet Take 1 tablet (325 mg total) by mouth 2 (two) times daily. Patient taking differently: Take 325 mg by mouth once a week. On Fridays 05/24/23 12/27/24  Gonfa, Taye T, MD  ibuprofen  (ADVIL ) 200 MG tablet Take 600 mg by mouth as needed for headache or moderate pain.    [provider]  ketoconazole (NIZORAL) 2 % shampoo Apply 1 Application topically See admin instructions. SHAMPOO AS DIRECTED AFFECTED AREA THREE TIMES A WEEK TO THE SCALP IN THE SHOWER LEAVE ON FOR 5 MINUTES THEN RINSE OFF. TO THE SCALP IN THE SHOWER LEAVE ON FOR 5 MINUTES THEN RINSE OFF. Every 4 days    [provider]  lamoTRIgine  (LAMICTAL ) 100 MG tablet Take 100 mg by mouth in the morning, 100 mg at lunch and 200 mg by mouth at bedtime. 12/01/23  Sater, Charlie LABOR, MD  methenamine (MANDELAMINE) 1 g tablet Take 1,000 mg by mouth 2 (two) times daily. Only takes this medication when not on an antibiotic.    [provider]  modafinil  (PROVIGIL ) 100 MG tablet Take 1 tablet by mouth every morning. 12/28/23   [provider]  nystatin cream (MYCOSTATIN) Apply 1 Application topically daily as needed for dry skin.    [provider]  ocrelizumab 600 mg in sodium chloride  0.9 % 500 mL Inject 600 mg into the vein every 6 (six) months.     [provider]  oxyCODONE -acetaminophen  (PERCOCET) 5-325 MG tablet Take 1 tablet by mouth every 12 (twelve) hours as needed for severe  pain (pain score 7-10). 11/01/23 10/31/24  Sater, Charlie LABOR, MD  potassium chloride  SA (KLOR-CON  M) 20 MEQ tablet Take 2 tablets (40 mEq total) by mouth daily. Patient not taking: Reported on 12/30/2023 10/24/22 12/27/24  Mapp, Tavien, MD  pregabalin  (LYRICA ) 150 MG capsule Take 1 capsule (150 mg total) by mouth 3 (three) times daily. 01/19/24   Sater, Charlie LABOR, MD    Allergies: Gadopiclenol  and Iodinated contrast media    Review of Systems  Updated Vital Signs BP (!) 101/58   Pulse 69   Temp (S) (!) 100.4 F (38 C) (Rectal)   Resp 18   Ht 5' 10 (1.778 m)   Wt 77 kg   SpO2 93%   BMI 24.36 kg/m   Physical Exam Vitals and nursing note reviewed.  Constitutional:      General: He is not in acute distress.    Appearance: He is well-developed.     Comments: Nonverbal.  Communicates to nodding his head and shaking his head.  Does respond appropriately to questions.  HENT:     Head: Normocephalic and atraumatic.     Right Ear: External ear normal.     Left Ear: External ear normal.     Nose: Nose normal.   Eyes:     Extraocular Movements: Extraocular movements intact.     Conjunctiva/sclera: Conjunctivae normal.     Pupils: Pupils are equal, round, and reactive to light.   Neck:     Comments: No meningismus Cardiovascular:     Rate and Rhythm: Normal rate and regular rhythm.     Heart sounds: Normal heart sounds.  Pulmonary:     Effort: Pulmonary effort is normal. No respiratory distress.     Breath sounds: Normal breath sounds.  Abdominal:     General: There is no distension.     Palpations: Abdomen is soft. There is no mass.     Tenderness: There is no abdominal tenderness. There is no guarding.  Genitourinary:    Comments: Chaperoned by RN. 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 Able to probe to bone. No purulence. R ulcer does have some surrounding erythema.   Musculoskeletal:     Cervical back: Normal range of motion and neck supple.   Skin:    General: Skin is  warm and dry.     Findings: Rash present.   Neurological:     Mental Status: He is alert. Mental status is at baseline.   Psychiatric:        Mood and Affect: Mood normal.        Behavior: Behavior normal.     (all labs ordered are listed, but only abnormal results are displayed) Labs Reviewed  COMPREHENSIVE METABOLIC PANEL WITH GFR - Abnormal; Notable for the following components:  Result Value   Potassium 3.4 (*)    Total Protein 5.6 (*)    AST 12 (*)    All other components within normal limits  CBC WITH DIFFERENTIAL/PLATELET - Abnormal; Notable for the following components:   WBC 10.7 (*)    Neutro Abs 9.3 (*)    Lymphs Abs 0.5 (*)    All other components within normal limits  URINALYSIS, W/ REFLEX TO CULTURE (INFECTION SUSPECTED) - Abnormal; Notable for the following components:   Color, Urine AMBER (*)    APPearance TURBID (*)    Ketones, ur 5 (*)    Protein, ur 100 (*)    Nitrite POSITIVE (*)    Leukocytes,Ua MODERATE (*)    Bacteria, UA MANY (*)    Non Squamous Epithelial 0-5 (*)    All other components within normal limits  C-REACTIVE PROTEIN - Abnormal; Notable for the following components:   CRP 5.1 (*)    All other components within normal limits  RESP PANEL BY RT-PCR (RSV, FLU A&B, COVID)  [id number]  CULTURE, BLOOD (SINGLE)  URINE CULTURE  CULTURE, BLOOD (SINGLE)  URINE CULTURE  SEDIMENTATION RATE  I-STAT CG4 LACTIC ACID, ED  I-STAT CG4 LACTIC ACID, ED    EKG: None  Radiology: CT ABDOMEN PELVIS WO CONTRAST Result Date: 04/02/2024 CLINICAL DATA:  Sacral wounds EXAM: CT ABDOMEN AND PELVIS WITHOUT CONTRAST TECHNIQUE: Multidetector CT imaging of the abdomen and pelvis was performed following the standard protocol without IV contrast. RADIATION DOSE REDUCTION: This exam was performed according to the departmental dose-optimization program which includes automated exposure control, adjustment of the mA and/or kV according to patient size and/or use of  iterative reconstruction technique. COMPARISON:  10/19/2022. FINDINGS: Lower chest: Bibasilar consolidation or volume loss more severe on the left than the right. Hepatobiliary: No focal liver abnormality is seen. No gallstones, gallbladder wall thickening, or biliary dilatation. Pancreas: Unremarkable. No pancreatic ductal dilatation or surrounding inflammatory changes. Spleen: Normal in size without focal abnormality. Adrenals/Urinary Tract: Adrenal glands are unremarkable. Kidneys are normal, without renal calculi, focal lesion, or hydronephrosis. Bladder is empty with a Foley. Stomach/Bowel: Increased stool consistent with constipation. Narrowing of the sigmoid colon may represent normal peristalsis versus stricture and represents a stable finding compared to the previous examination. No small bowel dilatation to suggest obstruction. Normal appendix. Vascular/Lymphatic: Aortic atherosclerosis. No enlarged abdominal or pelvic lymph nodes. Reproductive: Prostate is unremarkable. Other: No abdominal wall hernias.  No free air or fluid. Musculoskeletal: No acute osseous abnormalities. Mild compression deformity L1 is unchanged. Soft tissue defect deep to the bilateral ischia. The defects approach the ischia. Cortical changes in the bilateral ischia at the site of the open wounds could indicate associated osteomyelitis. This could be further assessed with MRI. IMPRESSION: 1. Bibasilar consolidation or volume loss. 2. Constipation. 3. Soft tissue defects deep to the bilateral ischia with possible associated osteomyelitis. This could be further assessed with MRI. 4. Aortic atherosclerosis 6 (ICD10-I70.0). Electronically Signed   By: Fonda Field M.D.   On: 04/02/2024 09:29   DG Chest Portable 1 View Result Date: 04/02/2024 EXAM: 1 VIEW XRAY OF THE CHEST 04/02/2024 07:33:00 AM COMPARISON: 12/30/2023 CLINICAL HISTORY: Fever. sacral wounds, possible osteo FINDINGS: LUNGS AND PLEURA: Low lung volumes exaggerate the  heart size. Mild bibasilar atelectasis is present. The lungs are otherwise clear. No pleural effusion. No pneumothorax. HEART AND MEDIASTINUM: No acute abnormality of the cardiac and mediastinal silhouettes. BONES AND SOFT TISSUES: No acute osseous abnormality. IMPRESSION: 1. No acute findings.  2. Mild bibasilar atelectasis. Electronically signed by: Lonni Necessary MD 04/02/2024 07:43 AM EDT RP Workstation: [id number]   DG Pelvis Portable Result Date: 04/02/2024 EXAM: 1 or 2 VIEW(S) XRAY OF THE PELVIS 04/02/2024 07:32:00 AM COMPARISON: 1-view pelvis 05/21/2023 CLINICAL HISTORY: Sacral wounds, possible osteomyelitis. FINDINGS: BONES AND JOINTS: No acute fracture. No focal osseous lesion. No joint dislocation. Degenerative changes are again noted in both hips. SOFT TISSUES: The soft tissues are unremarkable. IMPRESSION: 1. No acute findings. 2. Degenerative changes in both hips. Electronically signed by: Lonni Necessary MD 04/02/2024 07:42 AM EDT RP Workstation: [id number]     Procedures   Medications Ordered in the ED  vancomycin  (VANCOREADY) IVPB 1500 mg/300 mL (1,500 mg Intravenous New Bag/Given 04/02/24 0916)  ceFEPIme  (MAXIPIME ) 2 g in sodium chloride  0.9 % 100 mL IVPB (has no administration in time range)  acetaminophen  (TYLENOL ) suppository 650 mg (650 mg Rectal Given 04/02/24 0742)  cefTRIAXone  (ROCEPHIN ) 2 g in sodium chloride  0.9 % 100 mL IVPB (0 g Intravenous Stopped 04/02/24 0833)    Clinical Course as of 04/02/24 1028  Sun Apr 02, 2024  1022 Dr Claudene from hospitalist consulted regarding admission. [RP]    Clinical Course User Index [RP] Yolande Lamar BROCKS, MD                                 Medical Decision Making Amount and/or Complexity of Data Reviewed Labs: ordered. Radiology: ordered.  Risk OTC drugs. Prescription drug management. Decision regarding hospitalization.   63 year old male with a history of MS who is bedbound and nonverbal who presents to the  emergency department with fever and weakness.  Initial Ddx:  UTI, URI, cellulitis, osteomyelitis  MDM/Course:  Patient presents emergency department generalized weakness.  Also has a fever.  Does have an indwelling Foley catheter and has had UTIs in the past.  Peers to be at his mental baseline.  Does have pressure wounds on his sacrum these do appear to go to bone.  May have some surrounding cellulitis on the right 1.  Was septic due to his elevated temperature and white blood cell count so was started on antibiotics to cover him for cellulitis/osteomyelitis.  Urine came back and did show that he has a UTI and has grown Pseudomonas in the past so eventually was broadened to cefepime .  Did have a CT scan of his abdomen and pelvis that showed possible osteomyelitis but no other acute abnormalities.  COVID and flu negative.  Chest x-ray without pneumonia.  Upon reevaluation was stable.  Admitted to hospitalist for further management.  This patient presents to the ED for concern of complaints listed in HPI, this involves an extensive number of treatment options, and is a complaint that carries with it a high risk of complications and morbidity. Disposition including potential need for admission considered.   Dispo: Admit  Additional history obtained from spouse Records reviewed Admission Notes The following labs were independently interpreted: Urinalysis and show urinary tract infection I independently reviewed the following imaging with scope of interpretation limited to determining acute life threatening conditions related to emergency care: Chest x-ray and agree with the radiologist interpretation with the following exceptions: none I personally reviewed and interpreted cardiac monitoring: normal sinus rhythm  I personally reviewed and interpreted the pt's EKG: see above for interpretation  I have reviewed the patients home medications and made adjustments as needed Consults:  Hospitalist  Portions of this note were  generated with Scientist, clinical (histocompatibility and immunogenetics). Dictation errors may occur despite best attempts at proofreading.     Final diagnoses:  Sepsis, due to unspecified organism, unspecified whether acute organ dysfunction present [facility])  Urinary tract infection without hematuria, site unspecified  Skin ulcer of sacrum, unspecified ulcer stage [facility])    ED Discharge Orders     None          Yolande Lamar BROCKS, MD 04/02/24 1028

## 2024-04-02 NOTE — ED Notes (Signed)
 Phlebotomy attempted to get second set of blood cultures and second lactic acid but was unable to obtain any blood

## 2024-04-02 NOTE — Consult Note (Signed)
 WOC Nurse Consult Note: Reason for Consult: pressure injuries 63 year old male with a history of MS who is bedbound and nonverbal who presents to the emergency department with fever and weakness.  Wound type: Left gluteal; Stage 4 Pressure Injury Right gluteal; Stage 4 Pressure Injury Pressure Injury POA: Yes Measurement: See nursing flow sheets;  last seen in [facility] 6/9  Left; 5cm x 3.6cm x 0.4cm  Right; 7.2cm x 2cm x 1.6cm  Wound bed:  see nursing flow sheets; packing left in wounds for images; reviewed images from the [facility]; clean; pink Drainage (amount, consistency, odor) see nursing flow sheets Periwound: intact  Dressing procedure/placement/frequency: Cleanse bilateral buttock wounds with Marzella Soila # [phone]), pat dry. Pack with 1/4% Dakins moist (not soaked) gauze, top with ABD pad and secure with tape. Change daily  Air mattress ordered; for moisture management and pressure redistribution  FU with WCC at the time of DC.   Re consult if needed, will not follow at this time. Thanks  Rana Adorno [employer], RN,CWOCN, CNS, CWON-AP [phone])

## 2024-04-02 NOTE — ED Triage Notes (Signed)
 Pt BIB [facility] from home with wife as caregiver. He is bed bound with a his of MS with a chronic foley. EMS reports pt has had poor po intake over the past two days and has not been his usually self. No confusion, just more fatigued that normal. Worsening s/s this morning promoting caregiver to call EMS. Pt also has several sacral wounds that are healing. On initial assessment, pt gestures appropriately, voice incomprehensible.

## 2024-04-03 DIAGNOSIS — N390 Urinary tract infection, site not specified: Secondary | ICD-10-CM | POA: Diagnosis not present

## 2024-04-03 DIAGNOSIS — T83511A Infection and inflammatory reaction due to indwelling urethral catheter, initial encounter: Secondary | ICD-10-CM | POA: Diagnosis not present

## 2024-04-03 LAB — BASIC METABOLIC PANEL WITH GFR
Anion gap: 9 (ref 5–15)
BUN: 16 mg/dL (ref 6–20)
CO2: 22 mmol/L (ref 22–32)
Calcium: 8.6 mg/dL — ABNORMAL LOW (ref 8.9–10.3)
Chloride: 111 mmol/L (ref 98–111)
Creatinine, Ser: 0.79 mg/dL (ref 0.61–1.24)
GFR, Estimated: >60 mL/min (ref >60)
Glucose, Bld: 79 mg/dL (ref 70–99)
Potassium: 3.5 mmol/L (ref 3.5–5.1)
Sodium: 142 mmol/L (ref 135–145)

## 2024-04-03 LAB — CBC
HCT: 40.1 % (ref 39.0–52.0)
Hemoglobin: 12.7 g/dL — ABNORMAL LOW (ref 13.0–17.0)
MCH: 29.1 pg (ref 26.0–34.0)
MCHC: 31.7 g/dL (ref 30.0–36.0)
MCV: 92 fL (ref 80.0–100.0)
Platelets: 201 10*3/uL (ref 150–400)
RBC: 4.36 MIL/uL (ref 4.22–5.81)
RDW: 14.9 % (ref 11.5–15.5)
WBC: 4.6 10*3/uL (ref 4.0–10.5)
nRBC: 0 % (ref 0.0–0.2)

## 2024-04-03 NOTE — Plan of Care (Signed)
 Assumed care at 1900. Pt has been resting comfortably in bed overnight. Pt has been turned and repositioned. Pt has been offered fluids and often declined. Pt became diaphoretic and clammy but no fever noted. Pt expressed neck pain which resolved with medication.   Problem: Education: Goal: Knowledge of General Education information will improve Description: Including pain rating scale, medication(s)/side effects and non-pharmacologic comfort measures Outcome: Progressing   Problem: Health Behavior/Discharge Planning: Goal: Ability to manage health-related needs will improve Outcome: Progressing   Problem: Clinical Measurements: Goal: Ability to maintain clinical measurements within normal limits will improve Outcome: Progressing Goal: Will remain free from infection Outcome: Progressing Goal: Diagnostic test results will improve Outcome: Progressing Goal: Respiratory complications will improve Outcome: Progressing Goal: Cardiovascular complication will be avoided Outcome: Progressing   Problem: Activity: Goal: Risk for activity intolerance will decrease Outcome: Progressing   Problem: Elimination: Goal: Will not experience complications related to bowel motility Outcome: Progressing Goal: Will not experience complications related to urinary retention Outcome: Progressing   Problem: Safety: Goal: Ability to remain free from injury will improve Outcome: Progressing

## 2024-04-03 NOTE — Progress Notes (Signed)
 PROGRESS NOTE  [name] FMW:[id number] DOB: 25-Jun-1961 DOA: 04/02/2024 PCP: [facility]   LOS: 1 day   Brief narrative:  [name] is a 63 y.o. 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  He was having difficulty having a conversation neck stiffness.  On a modified diet at home and wife is his primary caregiver.  He has a history of trigeminal neuralgia, for which he takes pregabalin  150 mg three times daily and lamotrigine  with varying doses throughout the day which she wants to make sure he keeps on schedule for.  Has been having breakthroughs for over 2 years and was being seen at wound care center.  In the ED, patient was noted to be febrile with temperature of 100.4 F with hypotension.  Initial labs showed hypokalemia with potassium of 3.4 with CRP at 5.1.  Chest x-ray noted mild bibasilar atelectasis.  X-rays of the pelvis did not reveal any acute abnormality.  CT scan of the abdomen pelvis  revealed soft tissue deficits deep to the bilateral ischium with possible associated osteomyelitis.  Urinalysis was positive moderate leukocytes, positive nitrites, many bacteria, and greater than 50 WBCs.  Foley catheter have been exchanged while in the ED. patient was then admitted hospital for further evaluation and treatment  Assessment/Plan: Principal Problem:   Catheter-associated urinary tract infection (HCC) Active Problems:   Neurogenic bladder   Leukocytosis   Acute metabolic encephalopathy   Hypokalemia   Pressure ulcer   Multiple sclerosis, secondary progressive (HCC)   Dysphagia   Debility   Trigeminal neuralgia of right side of face   Depression   Suspected catheter associated urinary tract infection Neurogenic  bladder Acute metabolic encephalopathy. Presenting with increasing lethargy and fever. Urinalysis with more than 50 white cells.  Status post Foley catheter exchange in the ED.  Continue with cefepime .  History of resistant bacteria.  Blood cultures pending urine cultures pending.  More alert and awake today as per the patient's family at bedside.   Leukocytosis Improved at this time.  WBC elevated at 10.7 and CRP was elevated at 5.1.    Latest WBC at 4.6.   Hypokalemia Improved.  Latest potassium of 3.5.  Will continue to replenish.   Chronic pressure ulcers CT scan with soft tissue defect and possible osteomyelitis.  Spoke with the patient's wife about it.  She did have similar situation in the past and was ruled out.  She states that her bedsores have been healing pretty well  Unlikely osteomyelitis..  Will continue wound care.  Has been doing well with wound care as outpatient.  Multiple sclerosis Dysphagia Debility/bedbound status Has significant debility with dysphagia on nectar thick liquids and needs assistance.   He receives Ocrevus injections.  Continue baclofen .  Aspiration precautions.   Trigeminal neuralgia - Continue Lyrica , Lamictal    Depression - Continue Lexapro     DVT prophylaxis: enoxaparin  (LOVENOX ) injection 40 mg Start: 04/02/24 2000   Disposition: Likely home with home health in 1 to 2 days  Status is: Inpatient Remains inpatient appropriate because: Pending clinical improvement,    Code Status:     Code Status: Limited: Do not attempt resuscitation (DNR) -DNR-LIMITED -Do Not Intubate/DNI   Family Communication: Spoke with the patient's spouse at bedside  Consultants: None  Procedures: None  Anti-infectives:  Cefepime   Anti-infectives (From admission, onward)    Start     Dose/Rate Route Frequency Ordered Stop   04/02/24 1015  ceFEPIme  (MAXIPIME ) 2 g in sodium chloride  0.9 % 100 mL IVPB        2 g 200 mL/hr over 30 Minutes Intravenous Every  12 hours 04/02/24 1000     04/02/24 0800  vancomycin  (VANCOREADY) IVPB 1500 mg/300 mL        1,500 mg 150 mL/hr over 120 Minutes Intravenous  Once 04/02/24 0754 04/02/24 1122   04/02/24 0745  cefTRIAXone  (ROCEPHIN ) 2 g in sodium chloride  0.9 % 100 mL IVPB        2 g 200 mL/hr over 30 Minutes Intravenous Once 04/02/24 0732 04/02/24 0833   04/02/24 0745  vancomycin  (VANCOCIN ) IVPB 1000 mg/200 mL premix  Status:  Discontinued        1,000 mg 200 mL/hr over 60 Minutes Intravenous  Once 04/02/24 0732 04/02/24 0754        Subjective: Today, patient was seen and examined at bedside.  Patient denies any nausea, vomiting, fever, chills or rigor.  Reports that he is eating better and had improved swallowing and more alertness.  Still follows up some neck pain which is common while having infection.  Objective: Vitals:   04/03/24 0300 04/03/24 0715  BP: 107/66 101/61  Pulse: 60 (!) 51  Resp:  18  Temp: (!) 97.5 F (36.4 C) (!) 97.4 F (36.3 C)  SpO2: 95% 95%    Intake/Output Summary (Last 24 hours) at 04/03/2024 0955 Last data filed at 04/02/2024 1124 Gross per 24 hour  Intake 800 ml  Output --  Net 800 ml   Filed Weights   04/02/24 0731  Weight: 77 kg   Body mass index is 24.36 kg/m.   Physical Exam: GENERAL: Patient is alert awake and mildly Communicative,. Not in obvious distress.  Appears chronically ill and deconditioned. HENT: No scleral pallor or icterus. Pupils equally reactive to light. Oral mucosa is moist NECK: is supple, no gross swelling noted. CHEST: Clear to auscultation. No crackles or wheezes.  Diminished breath sounds bilaterally. CVS: S1 and S2 heard, no murmur. Regular rate and rhythm.  ABDOMEN: Soft, non-tender, bowel sounds are present.  Foley catheter noted EXTREMITIES: No edema.  Rigidity noted. CNS: Alert awake and Communicative.  Moves extremities. SKIN: warm and dry, ulceration on the back noted.  Data Review: I have personally reviewed the  following laboratory data and studies,  CBC: Recent Labs  Lab 04/02/24 0656 04/03/24 0321  WBC 10.7* 4.6  NEUTROABS 9.3*  --   HGB 14.4 12.7*  HCT 44.6 40.1  MCV 91.0 92.0  PLT 230 201   Basic Metabolic Panel: Recent Labs  Lab 04/02/24 0656 04/03/24 0321  NA 141 142  K 3.4* 3.5  CL 106 111  CO2 25 22  GLUCOSE 96 79  BUN 17 16  CREATININE 0.70 0.79  CALCIUM 9.3 8.6*   Liver Function Tests: Recent Labs  Lab 04/02/24 0656  AST 12*  ALT 11  ALKPHOS 92  BILITOT 0.5  PROT 5.6*  ALBUMIN 3.8   No results for input(s): LIPASE, AMYLASE in the last 168 hours. No results for input(s): AMMONIA in the last 168 hours. Cardiac Enzymes: No results for input(s): CKTOTAL, CKMB, CKMBINDEX, TROPONINI in the last 168 hours. BNP (last 3 results) No results for input(s): BNP in the last 8760 hours.  ProBNP (last 3 results) No results for input(s): PROBNP in the last 8760 hours.  CBG: No results for input(s): GLUCAP in the last 168 hours. Recent Results (from the past 240 hours)  Resp panel by RT-PCR (RSV, Flu A&B, Covid) Anterior Nasal Swab     Status: None   Collection Time: 04/02/24  7:18 AM   Specimen: Anterior Nasal Swab  Result Value Ref Range Status   SARS Coronavirus 2 by RT PCR NEGATIVE NEGATIVE Final   Influenza A by PCR NEGATIVE NEGATIVE Final   Influenza B by PCR NEGATIVE NEGATIVE Final    Comment: (NOTE) The Xpert Xpress SARS-CoV-2/FLU/RSV plus assay is intended as an aid in the diagnosis of influenza from Nasopharyngeal swab specimens and should not be used as a sole basis for treatment. Nasal washings and aspirates are unacceptable for Xpert Xpress SARS-CoV-2/FLU/RSV testing.  Fact Sheet for Patients: BloggerCourse.com  Fact Sheet for Healthcare Providers: SeriousBroker.it  This test is not yet approved or cleared by the [place]  FDA and has been authorized for detection and/or  diagnosis of SARS-CoV-2 by FDA under an Emergency Use Authorization (EUA). This EUA will remain in effect (meaning this test can be used) for the duration of the COVID-19 declaration under Section 564(b)(1) of the Act, 21 U.S.C. section 360bbb-3(b)(1), unless the authorization is terminated or revoked.     Resp Syncytial Virus by PCR NEGATIVE NEGATIVE Final    Comment: (NOTE) Fact Sheet for Patients: BloggerCourse.com  Fact Sheet for Healthcare Providers: SeriousBroker.it  This test is not yet approved or cleared by the [place]  FDA and has been authorized for detection and/or diagnosis of SARS-CoV-2 by FDA under an Emergency Use Authorization (EUA). This EUA will remain in effect (meaning this test can be used) for the duration of the COVID-19 declaration under Section 564(b)(1) of the Act, 21 U.S.C. section 360bbb-3(b)(1), unless the authorization is terminated or revoked.  Performed at [facility], [address] [place], [place] [address]   Urine Culture     Status: None (Preliminary result)   Collection Time: 04/02/24  7:55 AM   Specimen: Urine, Random  Result Value Ref Range Status   Specimen Description URINE, RANDOM  Final   Special Requests NONE Reflexed from [id number]  Final   Culture   Final    CULTURE REINCUBATED FOR BETTER GROWTH Performed at [facility], [address], [place], [place] [address]    Report Status PENDING  Incomplete  Culture, blood (single)     Status: None (Preliminary result)   Collection Time: 04/02/24  6:18 PM   Specimen: BLOOD  Result Value Ref Range Status   Specimen Description BLOOD BLOOD LEFT HAND  Final   Special Requests   Final    AEROBIC BOTTLE ONLY Blood Culture results may not be optimal due to an inadequate volume of blood received in culture bottles   Culture   Final    NO GROWTH < 24 HOURS Performed at [facility], [address] [place],  [place] [address]    Report Status PENDING  Incomplete  Culture, blood (single)     Status: None (Preliminary result)   Collection Time: 04/02/24  6:25 PM   Specimen: BLOOD  Result Value Ref Range Status   Specimen Description BLOOD BLOOD RIGHT HAND  Final   Special Requests   Final    BOTTLES DRAWN AEROBIC AND ANAEROBIC Blood Culture adequate volume   Culture   Final    NO GROWTH < 24 HOURS Performed at [facility], [address], [place],  [place] [address]    Report Status PENDING  Incomplete     Studies: CT ABDOMEN PELVIS WO CONTRAST Result Date: 04/02/2024 CLINICAL DATA:  Sacral wounds EXAM: CT ABDOMEN AND PELVIS WITHOUT CONTRAST TECHNIQUE: Multidetector CT imaging of the abdomen and pelvis was performed following the standard protocol without IV contrast. RADIATION DOSE REDUCTION: This exam was performed according to the departmental dose-optimization program which includes automated exposure control, adjustment of the mA and/or kV according to patient size and/or use of iterative reconstruction technique. COMPARISON:  10/19/2022. FINDINGS: Lower chest: Bibasilar consolidation or volume loss more severe on the left than the right. Hepatobiliary: No focal liver abnormality is seen. No gallstones, gallbladder wall thickening, or biliary dilatation. Pancreas: Unremarkable. No pancreatic ductal dilatation or surrounding inflammatory changes. Spleen: Normal in size without focal abnormality. Adrenals/Urinary Tract: Adrenal glands are unremarkable. Kidneys are normal, without renal calculi, focal lesion, or hydronephrosis. Bladder is empty with a Foley. Stomach/Bowel: Increased stool consistent with constipation. Narrowing of the sigmoid colon may represent normal peristalsis versus stricture and represents a stable finding compared to the previous examination. No small bowel dilatation to suggest obstruction. Normal appendix. Vascular/Lymphatic: Aortic atherosclerosis. No enlarged abdominal or  pelvic lymph nodes. Reproductive: Prostate is unremarkable. Other: No abdominal wall hernias.  No free air or fluid. Musculoskeletal: No acute osseous abnormalities. Mild compression deformity L1 is unchanged. Soft tissue defect deep to the bilateral ischia. The defects approach the ischia. Cortical changes in the bilateral ischia at the site of the open wounds could indicate associated osteomyelitis. This could be further assessed with MRI. IMPRESSION: 1. Bibasilar consolidation or volume loss. 2. Constipation. 3. Soft tissue defects deep to the bilateral ischia with possible associated osteomyelitis. This could be further assessed with MRI. 4. Aortic atherosclerosis 6 (ICD10-I70.0). Electronically Signed   By: Fonda Field M.D.   On: 04/02/2024 09:29   DG Chest Portable 1 View Result Date: 04/02/2024 EXAM: 1 VIEW XRAY OF THE CHEST 04/02/2024 07:33:00 AM COMPARISON: 12/30/2023 CLINICAL HISTORY: Fever. sacral wounds, possible osteo FINDINGS: LUNGS AND PLEURA: Low lung volumes exaggerate the heart size. Mild bibasilar atelectasis is present. The lungs are otherwise clear. No pleural effusion. No pneumothorax. HEART AND MEDIASTINUM: No acute abnormality of the cardiac and mediastinal silhouettes. BONES AND SOFT TISSUES: No acute osseous abnormality. IMPRESSION: 1. No acute findings. 2. Mild bibasilar atelectasis. Electronically signed by: Lonni Necessary MD 04/02/2024 07:43 AM EDT RP Workstation: [id number]   DG Pelvis Portable Result Date: 04/02/2024 EXAM: 1 or 2 VIEW(S) XRAY OF THE PELVIS 04/02/2024 07:32:00 AM COMPARISON: 1-view pelvis 05/21/2023 CLINICAL HISTORY: Sacral wounds, possible osteomyelitis. FINDINGS: BONES AND JOINTS: No acute fracture. No focal osseous lesion. No joint dislocation. Degenerative changes are again noted in both hips. SOFT TISSUES: The soft tissues are unremarkable. IMPRESSION: 1. No acute findings. 2. Degenerative changes in both hips. Electronically signed by: Lonni Necessary MD 04/02/2024 07:42 AM EDT RP Workstation: [id number]      Vernal Alstrom, MD  [facility] 04/03/2024  If 7PM-7AM, please contact night-coverage

## 2024-04-03 NOTE — Plan of Care (Signed)

## 2024-04-04 DIAGNOSIS — T83511A Infection and inflammatory reaction due to indwelling urethral catheter, initial encounter: Secondary | ICD-10-CM | POA: Diagnosis not present

## 2024-04-04 DIAGNOSIS — N390 Urinary tract infection, site not specified: Secondary | ICD-10-CM | POA: Diagnosis not present

## 2024-04-04 MED ORDER — BISACODYL 10 MG RE SUPP
10.0000 mg | Freq: Every day | RECTAL | Status: DC
  Administered 2024-04-04: 10 mg via RECTAL
  Filled 2024-04-04: qty 1

## 2024-04-04 MED ORDER — CIPROFLOXACIN HCL 500 MG PO TABS: 500.0000 mg | ORAL_TABLET | Freq: Two times a day (BID) | ORAL | 0 refills | Status: AC

## 2024-04-04 NOTE — Discharge Summary (Signed)
 Physician Discharge Summary  HAGOP MCCOLLAM FMW:[id number] DOB: 11-Sep-1961 DOA: 04/02/2024  PCP: [facility]  Admit date: 04/02/2024 Discharge date: 04/04/2024  Admitted From: Home  Discharge disposition: Home    Recommendations for Outpatient Follow-Up:   Follow up with your primary care provider in one week.  Check CBC, BMP, magnesium  in the next visit   Discharge Diagnosis:   Principal Problem:   Catheter-associated urinary tract infection (HCC) Active Problems:   Neurogenic bladder   Leukocytosis   Acute metabolic encephalopathy   Hypokalemia   Pressure ulcer   Multiple sclerosis, secondary progressive (HCC)   Dysphagia   Debility   Trigeminal neuralgia of right side of face   Depression    Discharge Condition: Improved.  Diet recommendation:  Regular.  Wound care: None.  Code status: Full.   History of Present Illness:   DAMIER DISANO is a 63 y.o. 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  He was having difficulty having a  neck stiffness.  On a modified diet at home and wife is his primary caregiver.  He has a history of trigeminal neuralgia, for which he takes pregabalin  150 mg three times daily and lamotrigine  with varying doses throughout the day which she wants to make sure he keeps on schedule for.  Has been having breakthroughs for over 2 years and was being seen at wound care center.  In the ED, patient was noted to be febrile with temperature of 100.4 F with hypotension.  Initial labs showed hypokalemia with potassium of 3.4 with CRP at 5.1.  Chest x-ray noted mild bibasilar atelectasis.  X-rays of the pelvis did not reveal any acute abnormality.  CT scan of the abdomen pelvis  revealed soft  tissue deficits deep to the bilateral ischium with possible associated osteomyelitis.  Urinalysis was positive moderate leukocytes, positive nitrites, many bacteria, and greater than 50 WBCs.  Foley catheter was exchanged while in the ED and patient was then admitted hospital for further evaluation and treatment    Hospital Course:   Following conditions were addressed during hospitalization as listed below,  catheter associated urinary tract infection Neurogenic bladder Acute metabolic encephalopathy secondary to infection. Presenting with increasing lethargy and fever. Urinalysis with more than 50 white cells.  Status post Foley catheter exchange in the ED. initially received cefepime ..  History of resistant bacteria.  Blood cultures was negative in 2 days.  Urine cultures showing Klebsiella Enterococcus and Pseudomonas.  Discussed with pharmacy and ciprofloxacin  will be prescribed for next 5 days on discharge. Encephalopathy has resolved at this time and is at baseline   Leukocytosis Resolved at this time   Hypokalemia Improved.  Latest potassium of 3.5.     Chronic pressure ulcers CT scan with soft tissue defect and possible osteomyelitis.  Spoke with the patient's wife about it.  She did have similar situation in the recent past and osteomyelitis was ruled out.  She states that her bedsores have been healing pretty well  Unlikely osteomyelitis and would not want to proceed with further investigation...  Will continue wound care.  Has been doing well with wound care as outpatient.   Multiple sclerosis Dysphagia Debility/bedbound status Has significant debility with dysphagia on nectar thick liquids and needs assistance.   He receives Ocrevus injections.  Continue baclofen .  Aspiration precautions.   Trigeminal neuralgia - Continue Lyrica , Lamictal    Depression - Continue Lexapro   Disposition.  At this time, patient is stable for disposition home with outpatient PCP follow-up.   Spoke with the patient's spouse prior to discharge  Medical Consultants:   None.  Procedures:    None Subjective:   Today, patient was seen and examined at bedside.  Patient is more alert awake and has less dysphagia and is able to eat okay.  No fevers or chills.  Spouse at bedside  Discharge Exam:   Vitals:   04/04/24 0348 04/04/24 0740  BP: 104/68 107/71  Pulse: 63 (!) 56  Resp:  16  Temp: 97.7 F (36.5 C) 97.6 F (36.4 C)  SpO2: 93% 96%   Vitals:   04/03/24 1946 04/03/24 2324 04/04/24 0348 04/04/24 0740  BP: 109/70 (!) 100/58 104/68 107/71  Pulse: 64 63 63 (!) 56  Resp: 18 18  16   Temp: 98.6 F (37 C) 98.1 F (36.7 C) 97.7 F (36.5 C) 97.6 F (36.4 C)  TempSrc:   Oral   SpO2: 92% 92% 93% 96%  Weight:      Height:        General: Alert awake, not in obvious distress, mildly Communicative, appears chronically ill and deconditioned. HENT: pupils equally reacting to light,  No scleral pallor or icterus noted. Oral mucosa is moist.  Chest:  Clear breath sounds.  Diminished breath sounds bilaterally. No crackles or wheezes.  CVS: S1 &S2 heard. No murmur.  Regular rate and rhythm. Abdomen: Soft, nontender, nondistended.  Bowel sounds are heard.  Foley catheter in place. Extremities: Mild rigidity noted. Psych: Alert, awake and oriented, normal mood CNS:  No cranial nerve deficits.  Moves all extremities.  Mild rigidity. Skin: Warm and dry.  No rashes noted.  The results of significant diagnostics from this hospitalization (including imaging, microbiology, ancillary and laboratory) are listed below for reference.     Diagnostic Studies:   CT ABDOMEN PELVIS WO CONTRAST Result Date: 04/02/2024 CLINICAL DATA:  Sacral wounds EXAM: CT ABDOMEN AND PELVIS WITHOUT CONTRAST TECHNIQUE: Multidetector CT imaging of the abdomen and pelvis was performed following the standard protocol without IV contrast. RADIATION DOSE REDUCTION: This exam was performed according to the  departmental dose-optimization program which includes automated exposure control, adjustment of the mA and/or kV according to patient size and/or use of iterative reconstruction technique. COMPARISON:  10/19/2022. FINDINGS: Lower chest: Bibasilar consolidation or volume loss more severe on the left than the right. Hepatobiliary: No focal liver abnormality is seen. No gallstones, gallbladder wall thickening, or biliary dilatation. Pancreas: Unremarkable. No pancreatic ductal dilatation or surrounding inflammatory changes. Spleen: Normal in size without focal abnormality. Adrenals/Urinary Tract: Adrenal glands are unremarkable. Kidneys are normal, without renal calculi, focal lesion, or hydronephrosis. Bladder is empty with a Foley. Stomach/Bowel: Increased stool consistent with constipation. Narrowing of the sigmoid colon may represent normal peristalsis versus stricture and represents a stable finding compared to the previous examination. No small bowel dilatation to suggest obstruction. Normal appendix. Vascular/Lymphatic: Aortic atherosclerosis. No enlarged abdominal or pelvic lymph nodes. Reproductive: Prostate is unremarkable. Other: No abdominal wall hernias.  No free air or fluid. Musculoskeletal: No acute osseous abnormalities. Mild compression deformity L1 is unchanged. Soft tissue defect deep to the bilateral ischia. The defects approach the ischia. Cortical changes in the bilateral ischia at the site of the open wounds could indicate associated osteomyelitis. This could be further assessed with MRI. IMPRESSION: 1. Bibasilar consolidation or volume  loss. 2. Constipation. 3. Soft tissue defects deep to the bilateral ischia with possible associated osteomyelitis. This could be further assessed with MRI. 4. Aortic atherosclerosis 6 (ICD10-I70.0). Electronically Signed   By: Fonda Field M.D.   On: 04/02/2024 09:29   DG Chest Portable 1 View Result Date: 04/02/2024 EXAM: 1 VIEW XRAY OF THE CHEST  04/02/2024 07:33:00 AM COMPARISON: 12/30/2023 CLINICAL HISTORY: Fever. sacral wounds, possible osteo FINDINGS: LUNGS AND PLEURA: Low lung volumes exaggerate the heart size. Mild bibasilar atelectasis is present. The lungs are otherwise clear. No pleural effusion. No pneumothorax. HEART AND MEDIASTINUM: No acute abnormality of the cardiac and mediastinal silhouettes. BONES AND SOFT TISSUES: No acute osseous abnormality. IMPRESSION: 1. No acute findings. 2. Mild bibasilar atelectasis. Electronically signed by: Lonni Necessary MD 04/02/2024 07:43 AM EDT RP Workstation: [id number]   DG Pelvis Portable Result Date: 04/02/2024 EXAM: 1 or 2 VIEW(S) XRAY OF THE PELVIS 04/02/2024 07:32:00 AM COMPARISON: 1-view pelvis 05/21/2023 CLINICAL HISTORY: Sacral wounds, possible osteomyelitis. FINDINGS: BONES AND JOINTS: No acute fracture. No focal osseous lesion. No joint dislocation. Degenerative changes are again noted in both hips. SOFT TISSUES: The soft tissues are unremarkable. IMPRESSION: 1. No acute findings. 2. Degenerative changes in both hips. Electronically signed by: Lonni Necessary MD 04/02/2024 07:42 AM EDT RP Workstation: [id number]     Labs:   Basic Metabolic Panel: Recent Labs  Lab 04/02/24 0656 04/03/24 0321  NA 141 142  K 3.4* 3.5  CL 106 111  CO2 25 22  GLUCOSE 96 79  BUN 17 16  CREATININE 0.70 0.79  CALCIUM 9.3 8.6*   GFR Estimated Creatinine Clearance: 101.4 mL/min (by C-G formula based on SCr of 0.79 mg/dL). Liver Function Tests: Recent Labs  Lab 04/02/24 0656  AST 12*  ALT 11  ALKPHOS 92  BILITOT 0.5  PROT 5.6*  ALBUMIN 3.8   No results for input(s): LIPASE, AMYLASE in the last 168 hours. No results for input(s): AMMONIA in the last 168 hours. Coagulation profile No results for input(s): INR, PROTIME in the last 168 hours.  CBC: Recent Labs  Lab 04/02/24 0656 04/03/24 0321  WBC 10.7* 4.6  NEUTROABS 9.3*  --   HGB 14.4 12.7*  HCT 44.6 40.1  MCV  91.0 92.0  PLT 230 201   Cardiac Enzymes: No results for input(s): CKTOTAL, CKMB, CKMBINDEX, TROPONINI in the last 168 hours. BNP: Invalid input(s): POCBNP CBG: No results for input(s): GLUCAP in the last 168 hours. D-Dimer No results for input(s): DDIMER in the last 72 hours. Hgb A1c No results for input(s): HGBA1C in the last 72 hours. Lipid Profile No results for input(s): CHOL, HDL, LDLCALC, TRIG, CHOLHDL, LDLDIRECT in the last 72 hours. Thyroid  function studies No results for input(s): TSH, T4TOTAL, T3FREE, THYROIDAB in the last 72 hours.  Invalid input(s): FREET3 Anemia work up No results for input(s): VITAMINB12, FOLATE, FERRITIN, TIBC, IRON, RETICCTPCT in the last 72 hours. Microbiology Recent Results (from the past 240 hours)  Resp panel by RT-PCR (RSV, Flu A&B, Covid) Anterior Nasal Swab     Status: None   Collection Time: 04/02/24  7:18 AM   Specimen: Anterior Nasal Swab  Result Value Ref Range Status   SARS Coronavirus 2 by RT PCR NEGATIVE NEGATIVE Final   Influenza A by PCR NEGATIVE NEGATIVE Final   Influenza B by PCR NEGATIVE NEGATIVE Final    Comment: (NOTE) The Xpert Xpress SARS-CoV-2/FLU/RSV plus assay is intended as an aid in the diagnosis of influenza from Nasopharyngeal swab specimens  and should not be used as a sole basis for treatment. Nasal washings and aspirates are unacceptable for Xpert Xpress SARS-CoV-2/FLU/RSV testing.  Fact Sheet for Patients: BloggerCourse.com  Fact Sheet for Healthcare Providers: SeriousBroker.it  This test is not yet approved or cleared by the [place]  FDA and has been authorized for detection and/or diagnosis of SARS-CoV-2 by FDA under an Emergency Use Authorization (EUA). This EUA will remain in effect (meaning this test can be used) for the duration of the COVID-19 declaration under Section 564(b)(1) of the Act, 21  U.S.C. section 360bbb-3(b)(1), unless the authorization is terminated or revoked.     Resp Syncytial Virus by PCR NEGATIVE NEGATIVE Final    Comment: (NOTE) Fact Sheet for Patients: BloggerCourse.com  Fact Sheet for Healthcare Providers: SeriousBroker.it  This test is not yet approved or cleared by the [place]  FDA and has been authorized for detection and/or diagnosis of SARS-CoV-2 by FDA under an Emergency Use Authorization (EUA). This EUA will remain in effect (meaning this test can be used) for the duration of the COVID-19 declaration under Section 564(b)(1) of the Act, 21 U.S.C. section 360bbb-3(b)(1), unless the authorization is terminated or revoked.  Performed at [facility], [address] [place], [place] [address]   Urine Culture     Status: Abnormal   Collection Time: 04/02/24  7:55 AM   Specimen: Urine, Random  Result Value Ref Range Status   Specimen Description URINE, RANDOM  Final   Special Requests   Final    NONE Reflexed from [id number] Performed at [facility], [address], [place], [place] [address]    Culture (A)  Final    >=100,000 COLONIES/mL KLEBSIELLA AEROGENES 50,000 COLONIES/mL ENTEROCOCCUS FAECALIS 50,000 COLONIES/mL PSEUDOMONAS AERUGINOSA    Report Status 04/05/2024 FINAL  Final   Organism ID, Bacteria KLEBSIELLA AEROGENES (A)  Final   Organism ID, Bacteria ENTEROCOCCUS FAECALIS (A)  Final   Organism ID, Bacteria PSEUDOMONAS AERUGINOSA (A)  Final      Susceptibility   Klebsiella aerogenes - MIC*    CEFEPIME  <=0.12 SENSITIVE Sensitive     CEFTRIAXONE  <=0.25 SENSITIVE Sensitive     CIPROFLOXACIN  <=0.25 SENSITIVE Sensitive     GENTAMICIN <=1 SENSITIVE Sensitive     IMIPENEM 1 SENSITIVE Sensitive     NITROFURANTOIN 64 INTERMEDIATE Intermediate     TRIMETH /SULFA  <=20 SENSITIVE Sensitive     PIP/TAZO <=4 SENSITIVE Sensitive ug/mL    * >=100,000 COLONIES/mL KLEBSIELLA  AEROGENES   Enterococcus faecalis - MIC*    AMPICILLIN  <=2 SENSITIVE Sensitive     NITROFURANTOIN <=16 SENSITIVE Sensitive     VANCOMYCIN  1 SENSITIVE Sensitive     * 50,000 COLONIES/mL ENTEROCOCCUS FAECALIS   Pseudomonas aeruginosa - MIC*    CEFTAZIDIME 4 SENSITIVE Sensitive     CIPROFLOXACIN  >=4 RESISTANT Resistant     GENTAMICIN 4 SENSITIVE Sensitive     IMIPENEM <=0.25 SENSITIVE Sensitive     PIP/TAZO <=4 SENSITIVE Sensitive ug/mL    CEFEPIME  4 SENSITIVE Sensitive     * 50,000 COLONIES/mL PSEUDOMONAS AERUGINOSA  Culture, blood (single)     Status: None (Preliminary result)   Collection Time: 04/02/24  6:18 PM   Specimen: BLOOD  Result Value Ref Range Status   Specimen Description BLOOD BLOOD LEFT HAND  Final   Special Requests   Final    AEROBIC BOTTLE ONLY Blood Culture results may not be optimal due to an inadequate volume of blood received in culture bottles   Culture  Final    NO GROWTH 3 DAYS Performed at [facility], [address] [place], [place] [address]    Report Status PENDING  Incomplete  Culture, blood (single)     Status: None (Preliminary result)   Collection Time: 04/02/24  6:25 PM   Specimen: BLOOD  Result Value Ref Range Status   Specimen Description BLOOD BLOOD RIGHT HAND  Final   Special Requests   Final    BOTTLES DRAWN AEROBIC AND ANAEROBIC Blood Culture adequate volume   Culture   Final    NO GROWTH 3 DAYS Performed at [facility], [address], [place], [place] [address]    Report Status PENDING  Incomplete     Discharge Instructions:   Discharge Instructions     Call MD for:  persistant nausea and vomiting   Complete by: As directed    Call MD for:  severe uncontrolled pain   Complete by: As directed    Call MD for:  temperature >100.4   Complete by: As directed    Diet general   Complete by: As directed    Discharge instructions   Complete by: As directed    Follow-up with your primary care provider in 1 week.   Complete the course of antibiotic.  Seek medical attention for worsening symptoms. Increase fluid intake.   Discharge wound care:   Complete by: As directed    Cleanse bilateral buttock wounds with Marzella Soila # [phone]), pat dry. Pack with 1/4% Dakins moist (not soaked) gauze, top with ABD pad and secure with tape. Change daily   Increase activity slowly   Complete by: As directed       Allergies as of 04/04/2024       Reactions   Gadopiclenol  Nausea And Vomiting   Iodinated Contrast Media Nausea And Vomiting        Medication List     STOP taking these medications    ferrous sulfate  325 (65 FE) MG EC tablet       TAKE these medications    acetaminophen  500 MG tablet Commonly known as: TYLENOL  Take 1,000 mg by mouth as needed for mild pain (pain score 1-3) or moderate pain (pain score 4-6).   albuterol  108 (90 Base) MCG/ACT inhaler Commonly known as: VENTOLIN  HFA TAKE 2 PUFFS BY MOUTH EVERY 6 HOURS AS NEEDED What changed: See the new instructions.   ascorbic acid  500 MG tablet Commonly known as: VITAMIN C  Take 1,000 mg by mouth daily.   baclofen  10 MG tablet Commonly known as: LIORESAL  Take 10 mg by mouth daily as needed for muscle spasms.   CALCIUM 600 PO Take 1 capsule by mouth at bedtime.   cholecalciferol  25 MCG (1000 UNIT) tablet Commonly known as: VITAMIN D3 Take 1,000 Units by mouth at bedtime.   ciprofloxacin  500 MG tablet Commonly known as: Cipro  Take 1 tablet (500 mg total) by mouth 2 (two) times daily for 5 days.   cyanocobalamin  500 MCG tablet Commonly known as: VITAMIN B12 Take 500 mcg by mouth daily.   escitalopram  10 MG tablet Commonly known as: LEXAPRO  Take 1 tablet (10 mg total) by mouth daily.   feeding supplement Liqd Take 237 mLs by mouth 2 (two) times daily between meals.   ibuprofen  200 MG tablet Commonly known as: ADVIL  Take 600 mg by mouth as needed for headache or moderate pain.   ketoconazole 2 % shampoo Commonly  known as: NIZORAL Apply 1 Application topically See admin  instructions. SHAMPOO AS DIRECTED AFFECTED AREA THREE TIMES A WEEK TO THE SCALP IN THE SHOWER LEAVE ON FOR 5 MINUTES THEN RINSE OFF. TO THE SCALP IN THE SHOWER LEAVE ON FOR 5 MINUTES THEN RINSE OFF. Every 4 days   lamoTRIgine  100 MG tablet Commonly known as: LAMICTAL  Take 100 mg by mouth in the morning, 100 mg at lunch and 200 mg by mouth at bedtime.   methenamine 1 g tablet Commonly known as: MANDELAMINE Take 1,000 mg by mouth 2 (two) times daily. Only takes this medication when not on an antibiotic.   modafinil  100 MG tablet Commonly known as: PROVIGIL  Take 1 tablet by mouth every morning.   nystatin cream Commonly known as: MYCOSTATIN Apply 1 Application topically daily as needed for dry skin.   ocrelizumab 600 mg in sodium chloride  0.9 % 500 mL Inject 600 mg into the vein every 6 (six) months.   oxyCODONE -acetaminophen  5-325 MG tablet Commonly known as: Percocet Take 1 tablet by mouth every 12 (twelve) hours as needed for severe pain (pain score 7-10).   potassium chloride  SA 20 MEQ tablet Commonly known as: KLOR-CON  M Take 2 tablets (40 mEq total) by mouth daily.   pregabalin  150 MG capsule Commonly known as: LYRICA  Take 1 capsule (150 mg total) by mouth 3 (three) times daily.   Santyl  250 UNIT/GM ointment Generic drug: collagenase  Apply 1 Application topically daily.   THERATEARS OP Place 1 drop into both eyes 4 (four) times daily as needed (dry eye).               Discharge Care Instructions  (From admission, onward)           Start     Ordered   04/04/24 0000  Discharge wound care:       Comments: Cleanse bilateral buttock wounds with Marzella Soila # [phone]), pat dry. Pack with 1/4% Dakins moist (not soaked) gauze, top with ABD pad and secure with tape. Change daily   04/04/24 1053            Follow-up Information     [facility] up in 1 week(s).   Contact  information: [address] [facility] [place] [address] [phone]                  Time coordinating discharge: 39 minutes  Signed:  Sundeep Destin  [facility] 04/05/2024, 9:09 AM

## 2024-04-04 NOTE — Plan of Care (Signed)
 Assumed care at 1900. Pt has been resting comfortably in bed overnight. Pt has no complaints of pain or discomfort. See MAR. Pt has been repositioned overnight. No BM noted. Continue IV Abx at this time. No significant events,    Problem: Education: Goal: Knowledge of General Education information will improve Description: Including pain rating scale, medication(s)/side effects and non-pharmacologic comfort measures Outcome: Progressing   Problem: Health Behavior/Discharge Planning: Goal: Ability to manage health-related needs will improve Outcome: Progressing   Problem: Clinical Measurements: Goal: Ability to maintain clinical measurements within normal limits will improve Outcome: Progressing Goal: Will remain free from infection Outcome: Progressing Goal: Diagnostic test results will improve Outcome: Progressing Goal: Respiratory complications will improve Outcome: Progressing Goal: Cardiovascular complication will be avoided Outcome: Progressing   Problem: Activity: Goal: Risk for activity intolerance will decrease Outcome: Progressing   Problem: Pain Managment: Goal: General experience of comfort will improve and/or be controlled Outcome: Progressing   Problem: Safety: Goal: Ability to remain free from injury will improve Outcome: Progressing

## 2024-04-05 LAB — URINE CULTURE: Culture: >=100000 — AB

## 2024-04-07 LAB — CULTURE, BLOOD (SINGLE)
Culture: NO GROWTH
Culture: NO GROWTH
Special Requests: ADEQUATE

## 2024-04-12 NOTE — Telephone Encounter (Signed)
 Called the [facility] at [phone], ext [phone] and spoke w/ [name]. Relayed fax# not working. She asked we send form/notes to email: [email or web address]. Put specialty in subject line. I sent email, waiting on response.

## 2024-04-17 ENCOUNTER — Encounter (HOSPITAL_BASED_OUTPATIENT_CLINIC_OR_DEPARTMENT_OTHER): Attending: Internal Medicine | Admitting: Internal Medicine

## 2024-04-17 DIAGNOSIS — G35 Multiple sclerosis: Secondary | ICD-10-CM | POA: Diagnosis not present

## 2024-04-17 DIAGNOSIS — L89314 Pressure ulcer of right buttock, stage 4: Secondary | ICD-10-CM | POA: Diagnosis present

## 2024-04-17 DIAGNOSIS — N319 Neuromuscular dysfunction of bladder, unspecified: Secondary | ICD-10-CM | POA: Insufficient documentation

## 2024-04-17 DIAGNOSIS — L89324 Pressure ulcer of left buttock, stage 4: Secondary | ICD-10-CM | POA: Diagnosis not present

## 2024-04-17 DIAGNOSIS — E440 Moderate protein-calorie malnutrition: Secondary | ICD-10-CM | POA: Insufficient documentation

## 2024-05-15 ENCOUNTER — Encounter (HOSPITAL_BASED_OUTPATIENT_CLINIC_OR_DEPARTMENT_OTHER): Attending: Internal Medicine | Admitting: Internal Medicine

## 2024-05-15 DIAGNOSIS — L89324 Pressure ulcer of left buttock, stage 4: Secondary | ICD-10-CM | POA: Diagnosis not present

## 2024-05-15 DIAGNOSIS — L89314 Pressure ulcer of right buttock, stage 4: Secondary | ICD-10-CM

## 2024-05-15 DIAGNOSIS — G35 Multiple sclerosis: Secondary | ICD-10-CM

## 2024-05-15 DIAGNOSIS — E440 Moderate protein-calorie malnutrition: Secondary | ICD-10-CM

## 2024-06-19 ENCOUNTER — Encounter (HOSPITAL_BASED_OUTPATIENT_CLINIC_OR_DEPARTMENT_OTHER): Attending: Internal Medicine | Admitting: Internal Medicine

## 2024-06-19 DIAGNOSIS — N319 Neuromuscular dysfunction of bladder, unspecified: Secondary | ICD-10-CM | POA: Diagnosis not present

## 2024-06-19 DIAGNOSIS — L89324 Pressure ulcer of left buttock, stage 4: Secondary | ICD-10-CM | POA: Insufficient documentation

## 2024-06-19 DIAGNOSIS — G35 Multiple sclerosis: Secondary | ICD-10-CM | POA: Insufficient documentation

## 2024-06-19 DIAGNOSIS — L89314 Pressure ulcer of right buttock, stage 4: Secondary | ICD-10-CM | POA: Insufficient documentation

## 2024-06-19 DIAGNOSIS — E440 Moderate protein-calorie malnutrition: Secondary | ICD-10-CM | POA: Insufficient documentation

## 2024-06-25 ENCOUNTER — Emergency Department (HOSPITAL_COMMUNITY)

## 2024-06-25 ENCOUNTER — Inpatient Hospital Stay (HOSPITAL_COMMUNITY)
Admission: EM | Admit: 2024-06-25 | Discharge: 2024-06-30 | DRG: 193 | Disposition: A | Discharge status: Home Health | Attending: Internal Medicine | Admitting: Internal Medicine

## 2024-06-25 ENCOUNTER — Other Ambulatory Visit: Payer: Self-pay

## 2024-06-25 ENCOUNTER — Encounter (HOSPITAL_COMMUNITY): Payer: Self-pay | Admitting: Emergency Medicine

## 2024-06-25 DIAGNOSIS — Z820 Family history of epilepsy and other diseases of the nervous system: Secondary | ICD-10-CM

## 2024-06-25 DIAGNOSIS — J9601 Acute respiratory failure with hypoxia: Secondary | ICD-10-CM | POA: Diagnosis present

## 2024-06-25 DIAGNOSIS — L89314 Pressure ulcer of right buttock, stage 4: Secondary | ICD-10-CM | POA: Diagnosis present

## 2024-06-25 DIAGNOSIS — Z87891 Personal history of nicotine dependence: Secondary | ICD-10-CM

## 2024-06-25 DIAGNOSIS — J189 Pneumonia, unspecified organism: Secondary | ICD-10-CM | POA: Diagnosis not present

## 2024-06-25 DIAGNOSIS — L89159 Pressure ulcer of sacral region, unspecified stage: Secondary | ICD-10-CM | POA: Diagnosis not present

## 2024-06-25 DIAGNOSIS — Z888 Allergy status to other drugs, medicaments and biological substances status: Secondary | ICD-10-CM

## 2024-06-25 DIAGNOSIS — Z91041 Radiographic dye allergy status: Secondary | ICD-10-CM

## 2024-06-25 DIAGNOSIS — Z7401 Bed confinement status: Secondary | ICD-10-CM

## 2024-06-25 DIAGNOSIS — N319 Neuromuscular dysfunction of bladder, unspecified: Secondary | ICD-10-CM | POA: Diagnosis present

## 2024-06-25 DIAGNOSIS — E860 Dehydration: Secondary | ICD-10-CM | POA: Diagnosis present

## 2024-06-25 DIAGNOSIS — Z79899 Other long term (current) drug therapy: Secondary | ICD-10-CM

## 2024-06-25 DIAGNOSIS — F32A Depression, unspecified: Secondary | ICD-10-CM | POA: Diagnosis present

## 2024-06-25 DIAGNOSIS — G500 Trigeminal neuralgia: Secondary | ICD-10-CM | POA: Diagnosis not present

## 2024-06-25 DIAGNOSIS — K5900 Constipation, unspecified: Secondary | ICD-10-CM | POA: Diagnosis present

## 2024-06-25 DIAGNOSIS — G35 Multiple sclerosis: Secondary | ICD-10-CM | POA: Diagnosis present

## 2024-06-25 DIAGNOSIS — S31000A Unspecified open wound of lower back and pelvis without penetration into retroperitoneum, initial encounter: Secondary | ICD-10-CM | POA: Insufficient documentation

## 2024-06-25 DIAGNOSIS — L89324 Pressure ulcer of left buttock, stage 4: Secondary | ICD-10-CM | POA: Diagnosis present

## 2024-06-25 DIAGNOSIS — Z978 Presence of other specified devices: Secondary | ICD-10-CM

## 2024-06-25 DIAGNOSIS — G8250 Quadriplegia, unspecified: Secondary | ICD-10-CM | POA: Diagnosis present

## 2024-06-25 DIAGNOSIS — Z66 Do not resuscitate: Secondary | ICD-10-CM | POA: Diagnosis present

## 2024-06-25 DIAGNOSIS — J90 Pleural effusion, not elsewhere classified: Secondary | ICD-10-CM | POA: Diagnosis present

## 2024-06-25 DIAGNOSIS — Z87440 Personal history of urinary (tract) infections: Secondary | ICD-10-CM

## 2024-06-25 DIAGNOSIS — G35C1 Active secondary progressive multiple sclerosis: Secondary | ICD-10-CM | POA: Diagnosis present

## 2024-06-25 LAB — COMPREHENSIVE METABOLIC PANEL WITH GFR
ALT: 8 U/L (ref 0–44)
AST: 11 U/L — ABNORMAL LOW (ref 15–41)
Albumin: 3.7 g/dL (ref 3.5–5.0)
Alkaline Phosphatase: 98 U/L (ref 38–126)
Anion gap: 8 (ref 5–15)
BUN: 12 mg/dL (ref 8–23)
CO2: 25 mmol/L (ref 22–32)
Calcium: 8.9 mg/dL (ref 8.9–10.3)
Chloride: 105 mmol/L (ref 98–111)
Creatinine, Ser: 0.74 mg/dL (ref 0.61–1.24)
GFR, Estimated: >60 mL/min (ref >60)
Glucose, Bld: 90 mg/dL (ref 70–99)
Potassium: 3.6 mmol/L (ref 3.5–5.1)
Sodium: 138 mmol/L (ref 135–145)
Total Bilirubin: 1.2 mg/dL (ref 0.0–1.2)
Total Protein: 6.1 g/dL — ABNORMAL LOW (ref 6.5–8.1)

## 2024-06-25 LAB — RESPIRATORY PANEL BY PCR

## 2024-06-25 LAB — I-STAT VENOUS BLOOD GAS, ED
Acid-Base Excess: 4 mmol/L — ABNORMAL HIGH (ref 0.0–2.0)
Bicarbonate: 28.7 mmol/L — ABNORMAL HIGH (ref 20.0–28.0)
Calcium, Ion: 1.14 mmol/L — ABNORMAL LOW (ref 1.15–1.40)
HCT: 44 % (ref 39.0–52.0)
Hemoglobin: 15 g/dL (ref 13.0–17.0)
O2 Saturation: 74 %
Potassium: 3.7 mmol/L (ref 3.5–5.1)
Sodium: 141 mmol/L (ref 135–145)
TCO2: 30 mmol/L (ref 22–32)
pCO2, Ven: 43.7 mmHg — ABNORMAL LOW (ref 44–60)
pH, Ven: 7.425 (ref 7.25–7.43)
pO2, Ven: 38 mmHg (ref 32–45)

## 2024-06-25 LAB — URINALYSIS, ROUTINE W REFLEX MICROSCOPIC
Bilirubin Urine: NEGATIVE
Bilirubin Urine: NEGATIVE
Glucose, UA: NEGATIVE mg/dL
Glucose, UA: NEGATIVE mg/dL
Hgb urine dipstick: NEGATIVE
Ketones, ur: NEGATIVE mg/dL
Ketones, ur: NEGATIVE mg/dL
Nitrite: NEGATIVE
Nitrite: NEGATIVE
Protein, ur: NEGATIVE mg/dL
Protein, ur: NEGATIVE mg/dL
Specific Gravity, Urine: 1.01 (ref 1.005–1.030)
Specific Gravity, Urine: 1.005 (ref 1.005–1.030)
pH: 7 (ref 5.0–8.0)
pH: 6 (ref 5.0–8.0)

## 2024-06-25 LAB — CBC WITH DIFFERENTIAL/PLATELET
Abs Immature Granulocytes: 0.06 K/uL (ref 0.00–0.07)
Basophils Absolute: 0 K/uL (ref 0.0–0.1)
Basophils Relative: 0 %
Eosinophils Absolute: 0.2 K/uL (ref 0.0–0.5)
Eosinophils Relative: 1 %
HCT: 45.4 % (ref 39.0–52.0)
Hemoglobin: 14.6 g/dL (ref 13.0–17.0)
Immature Granulocytes: 0 %
Lymphocytes Relative: 4 %
Lymphs Abs: 0.6 K/uL — ABNORMAL LOW (ref 0.7–4.0)
MCH: 29.5 pg (ref 26.0–34.0)
MCHC: 32.2 g/dL (ref 30.0–36.0)
MCV: 91.7 fL (ref 80.0–100.0)
Monocytes Absolute: 1 K/uL (ref 0.1–1.0)
Monocytes Relative: 6 %
Neutro Abs: 14.7 K/uL — ABNORMAL HIGH (ref 1.7–7.7)
Neutrophils Relative %: 89 %
Platelets: 180 K/uL (ref 150–400)
RBC: 4.95 MIL/uL (ref 4.22–5.81)
RDW: 15.8 % — ABNORMAL HIGH (ref 11.5–15.5)
WBC: 16.6 K/uL — ABNORMAL HIGH (ref 4.0–10.5)
nRBC: 0 % (ref 0.0–0.2)

## 2024-06-25 LAB — MAGNESIUM
Magnesium: 1.7 mg/dL (ref 1.7–2.4)
Magnesium: 1.8 mg/dL (ref 1.7–2.4)

## 2024-06-25 LAB — LIPASE, BLOOD: Lipase: 24 U/L (ref 11–51)

## 2024-06-25 LAB — PROCALCITONIN: Procalcitonin: 0.37 ng/mL

## 2024-06-25 LAB — LACTIC ACID, PLASMA: Lactic Acid, Venous: 1.1 mmol/L (ref 0.5–1.9)

## 2024-06-25 LAB — BRAIN NATRIURETIC PEPTIDE: B Natriuretic Peptide: 38.8 pg/mL (ref 0.0–100.0)

## 2024-06-25 LAB — TSH: TSH: 0.797 u[IU]/mL (ref 0.350–4.500)

## 2024-06-25 LAB — RESP PANEL BY RT-PCR (RSV, FLU A&B, COVID)  RVPGX2
Influenza A by PCR: NEGATIVE
Influenza B by PCR: NEGATIVE
Resp Syncytial Virus by PCR: NEGATIVE
SARS Coronavirus 2 by RT PCR: NEGATIVE

## 2024-06-25 LAB — HIV ANTIBODY (ROUTINE TESTING W REFLEX): HIV Screen 4th Generation wRfx: NONREACTIVE

## 2024-06-25 MED ORDER — ACETAMINOPHEN 650 MG RE SUPP: 650.0000 mg | Freq: Four times a day (QID) | RECTAL | Status: DC | PRN

## 2024-06-25 MED ORDER — LAMOTRIGINE 100 MG PO TABS
100.0000 mg | ORAL_TABLET | Freq: Every day | ORAL | Status: DC
  Administered 2024-06-25 – 2024-06-30 (×6): 100 mg via ORAL
  Filled 2024-06-25 (×7): qty 1

## 2024-06-25 MED ORDER — LAMOTRIGINE 100 MG PO TABS: 100.0000 mg | ORAL_TABLET | Freq: Every day | ORAL | Status: DC

## 2024-06-25 MED ORDER — ACETAMINOPHEN 325 MG PO TABS: 650.0000 mg | ORAL_TABLET | Freq: Four times a day (QID) | ORAL | Status: DC | PRN

## 2024-06-25 MED ORDER — FUROSEMIDE 10 MG/ML IJ SOLN
40.0000 mg | Freq: Once | INTRAMUSCULAR | Status: AC
  Administered 2024-06-25: 40 mg via INTRAVENOUS
  Filled 2024-06-25: qty 4

## 2024-06-25 MED ORDER — VITAMIN C 500 MG PO TABS
1000.0000 mg | ORAL_TABLET | Freq: Every day | ORAL | Status: DC
  Administered 2024-06-26 – 2024-06-30 (×5): 1000 mg via ORAL
  Filled 2024-06-25 (×5): qty 2

## 2024-06-25 MED ORDER — CHLORHEXIDINE GLUCONATE CLOTH 2 % EX PADS
6.0000 | MEDICATED_PAD | Freq: Every day | CUTANEOUS | Status: DC
  Administered 2024-06-26 – 2024-06-30 (×5): 6 via TOPICAL

## 2024-06-25 MED ORDER — PREGABALIN 75 MG PO CAPS
150.0000 mg | ORAL_CAPSULE | Freq: Three times a day (TID) | ORAL | Status: DC
  Administered 2024-06-25 – 2024-06-30 (×16): 150 mg via ORAL
  Filled 2024-06-25 (×16): qty 2

## 2024-06-25 MED ORDER — VITAMIN D 25 MCG (1000 UNIT) PO TABS
1000.0000 [IU] | ORAL_TABLET | Freq: Every day | ORAL | Status: DC
  Administered 2024-06-25 – 2024-06-29 (×5): 1000 [IU] via ORAL
  Filled 2024-06-25 (×5): qty 1

## 2024-06-25 MED ORDER — ESCITALOPRAM OXALATE 10 MG PO TABS
10.0000 mg | ORAL_TABLET | Freq: Every day | ORAL | Status: DC
  Administered 2024-06-25 – 2024-06-30 (×6): 10 mg via ORAL
  Filled 2024-06-25 (×6): qty 1

## 2024-06-25 MED ORDER — POLYETHYLENE GLYCOL 3350 17 G PO PACK: 17.0000 g | PACK | Freq: Every day | ORAL | Status: DC | PRN

## 2024-06-25 MED ORDER — BACLOFEN 10 MG PO TABS: 10.0000 mg | ORAL_TABLET | Freq: Every day | ORAL | Status: DC | PRN

## 2024-06-25 MED ORDER — OXYCODONE-ACETAMINOPHEN 5-325 MG PO TABS
1.0000 | ORAL_TABLET | Freq: Two times a day (BID) | ORAL | Status: DC | PRN
  Administered 2024-06-25 – 2024-06-30 (×4): 1 via ORAL
  Filled 2024-06-25 (×4): qty 1

## 2024-06-25 MED ORDER — LAMOTRIGINE 25 MG PO TABS
100.0000 mg | ORAL_TABLET | Freq: Once | ORAL | Status: AC
Start: 2024-06-25 — End: 2024-06-25
  Administered 2024-06-25: 100 mg via ORAL
  Filled 2024-06-25: qty 4

## 2024-06-25 MED ORDER — MODAFINIL 100 MG PO TABS
100.0000 mg | ORAL_TABLET | Freq: Every morning | ORAL | Status: DC
  Administered 2024-06-25 – 2024-06-30 (×6): 100 mg via ORAL
  Filled 2024-06-25 (×6): qty 1

## 2024-06-25 MED ORDER — ENOXAPARIN SODIUM 40 MG/0.4ML IJ SOSY
40.0000 mg | PREFILLED_SYRINGE | INTRAMUSCULAR | Status: DC
  Administered 2024-06-25 – 2024-06-29 (×5): 40 mg via SUBCUTANEOUS
  Filled 2024-06-25 (×5): qty 0.4

## 2024-06-25 MED ORDER — LAMOTRIGINE 100 MG PO TABS
200.0000 mg | ORAL_TABLET | Freq: Every day | ORAL | Status: DC
  Administered 2024-06-25 – 2024-06-29 (×5): 200 mg via ORAL
  Filled 2024-06-25 (×5): qty 2

## 2024-06-25 MED ORDER — SODIUM CHLORIDE 0.9 % IV SOLN
1.0000 g | Freq: Once | INTRAVENOUS | Status: AC
  Administered 2024-06-25: 1 g via INTRAVENOUS
  Filled 2024-06-25: qty 10

## 2024-06-25 MED ORDER — ACETAMINOPHEN 500 MG PO TABS: 1000.0000 mg | ORAL_TABLET | ORAL | Status: DC | PRN

## 2024-06-25 MED ORDER — KETOCONAZOLE 2 % EX CREA
TOPICAL_CREAM | Freq: Every day | CUTANEOUS | Status: DC
  Filled 2024-06-25: qty 15

## 2024-06-25 MED ORDER — LAMOTRIGINE 100 MG PO TABS
100.0000 mg | ORAL_TABLET | Freq: Every day | ORAL | Status: DC
  Administered 2024-06-26 – 2024-06-30 (×5): 100 mg via ORAL
  Filled 2024-06-25 (×5): qty 1

## 2024-06-25 MED ORDER — TRIAMCINOLONE ACETONIDE 0.1 % EX CREA
TOPICAL_CREAM | Freq: Every day | CUTANEOUS | Status: DC
  Filled 2024-06-25: qty 15

## 2024-06-25 MED ORDER — SODIUM CHLORIDE 0.9 % IV SOLN
500.0000 mg | Freq: Once | INTRAVENOUS | Status: AC
  Administered 2024-06-25: 500 mg via INTRAVENOUS
  Filled 2024-06-25: qty 5

## 2024-06-25 MED ORDER — DAKINS (1/4 STRENGTH) 0.125 % EX SOLN
Freq: Every day | CUTANEOUS | Status: AC
Start: 2024-06-25 — End: 2024-06-28
  Filled 2024-06-25: qty 473

## 2024-06-25 MED ORDER — ALBUTEROL SULFATE (2.5 MG/3ML) 0.083% IN NEBU: 2.5000 mg | INHALATION_SOLUTION | Freq: Four times a day (QID) | RESPIRATORY_TRACT | Status: DC | PRN

## 2024-06-25 MED ORDER — VITAMIN B-12 1000 MCG PO TABS
500.0000 ug | ORAL_TABLET | Freq: Every day | ORAL | Status: DC
  Administered 2024-06-26 – 2024-06-30 (×5): 500 ug via ORAL
  Filled 2024-06-25 (×5): qty 1

## 2024-06-25 NOTE — ED Notes (Signed)
 Repeat lactic d/c by physician.

## 2024-06-25 NOTE — Plan of Care (Signed)
  Problem: Education: Goal: Knowledge of General Education information will improve Description: Including pain rating scale, medication(s)/side effects and non-pharmacologic comfort measures 06/25/2024 2020 by Burnard Almarie BROCKS, RN Outcome: Progressing 06/25/2024 2019 by Burnard Almarie BROCKS, RN Outcome: Progressing   Problem: Health Behavior/Discharge Planning: Goal: Ability to manage health-related needs will improve 06/25/2024 2020 by Burnard Almarie BROCKS, RN Outcome: Progressing 06/25/2024 2019 by Burnard Almarie BROCKS, RN Outcome: Progressing   Problem: Clinical Measurements: Goal: Ability to maintain clinical measurements within normal limits will improve 06/25/2024 2020 by Burnard Almarie BROCKS, RN Outcome: Progressing 06/25/2024 2019 by Burnard Almarie BROCKS, RN Outcome: Progressing Goal: Will remain free from infection 06/25/2024 2020 by Burnard Almarie BROCKS, RN Outcome: Progressing 06/25/2024 2019 by Burnard Almarie BROCKS, RN Outcome: Progressing Goal: Diagnostic test results will improve 06/25/2024 2020 by Burnard Almarie BROCKS, RN Outcome: Progressing 06/25/2024 2019 by Burnard Almarie BROCKS, RN Outcome: Progressing Goal: Respiratory complications will improve 06/25/2024 2020 by Burnard Almarie BROCKS, RN Outcome: Progressing 06/25/2024 2019 by Burnard Almarie BROCKS, RN Outcome: Progressing Goal: Cardiovascular complication will be avoided 06/25/2024 2020 by Burnard Almarie BROCKS, RN Outcome: Progressing 06/25/2024 2019 by Burnard Almarie BROCKS, RN Outcome: Progressing   Problem: Activity: Goal: Risk for activity intolerance will decrease 06/25/2024 2020 by Burnard Almarie BROCKS, RN Outcome: Progressing 06/25/2024 2019 by Burnard Almarie BROCKS, RN Outcome: Progressing   Problem: Nutrition: Goal: Adequate nutrition will be maintained 06/25/2024 2020 by Burnard Almarie BROCKS, RN Outcome: Progressing 06/25/2024 2019 by Burnard Almarie BROCKS, RN Outcome: Progressing   Problem: Coping: Goal: Level of anxiety will  decrease 06/25/2024 2020 by Burnard Almarie BROCKS, RN Outcome: Progressing 06/25/2024 2019 by Burnard Almarie BROCKS, RN Outcome: Progressing   Problem: Elimination: Goal: Will not experience complications related to bowel motility 06/25/2024 2020 by Burnard Almarie BROCKS, RN Outcome: Progressing 06/25/2024 2019 by Burnard Almarie BROCKS, RN Outcome: Progressing Goal: Will not experience complications related to urinary retention 06/25/2024 2020 by Burnard Almarie BROCKS, RN Outcome: Progressing 06/25/2024 2019 by Burnard Almarie BROCKS, RN Outcome: Progressing   Problem: Pain Managment: Goal: General experience of comfort will improve and/or be controlled 06/25/2024 2020 by Burnard Almarie BROCKS, RN Outcome: Progressing 06/25/2024 2019 by Burnard Almarie BROCKS, RN Outcome: Progressing   Problem: Safety: Goal: Ability to remain free from injury will improve 06/25/2024 2020 by Burnard Almarie BROCKS, RN Outcome: Progressing 06/25/2024 2019 by Burnard Almarie BROCKS, RN Outcome: Progressing   Problem: Skin Integrity: Goal: Risk for impaired skin integrity will decrease 06/25/2024 2020 by Burnard Almarie BROCKS, RN Outcome: Progressing 06/25/2024 2019 by Burnard Almarie BROCKS, RN Outcome: Progressing

## 2024-06-25 NOTE — H&P (Signed)
 Date: 06/25/2024               Patient Name:  NYSIR FERGUSSON MRN: [id number]  DOB: [birth year] Age / Sex: 63 y.o., male   PCP: [facility]         Medical Service: Internal Medicine Teaching Service         Attending Physician: Dr. Reyes Fenton      First Contact: Rebecka Pion, DO    Second Contact: Dr. Roetta Chars, MD          Pager Information: First Contact Pager: [phone]   Second Contact Pager: [phone]   SUBJECTIVE   Chief Complaint: Shocks in Left Side of the Head  History of Present Illness: CLINE DRAHEIM is a 63 y.o. male with PMH of MS, Trigeminal neuralgia, spastic quadriparesis, depression who presents with shocks on the left side of head. Pt is bed bound and is accompanied by wife who contributes to majority of the hx. Wife mentioned that pt is usually able to speak--though it is not clear but he is able to speak in full sentences. Wife mentioned that pt complained about shocks on the left side of his head that made him rigid last night. She said pt wasn't able to pin point the exact part of the head but just said the left side. Pt has never complained about these types of shocks before. Wife said pt has trigeminal neuralgia (TN) but that flare occurs always on the right jaw. Wife said that he took his lamotrigine  and baclofen  yesterday night which seemed to tone down the pain but pt was still in discomfort last night even after he went to sleep. Wife said that pt has become less responsive and even though has been eating his meals, she noticed that his appetite had gone down. Wife mentioned that pt usually acts like this when he has a UTI and believed he had one as he has had episodes of UTIs in the past. He was recently admitted in June from 06/22-06/24 for suspected urinary tract infection. At present, wife denied pt having any fevers, chills. Pt has the chronic issue of what the wife called a rattle--a sound in his chest which goes away when pt coughs. Pt  has done this in the past few days, which is not new for him, and wife does not notice any new cough or sputum production. On asking the pt, he denies any cough, or sputum production which is confirmed by the wife. Pt does endorse SOB and said it is shocks in his head that is bothering him the most. Wife denies any changes in diet or sick contacts in the past few days. He endorses loose stools which the wife said is normal to him and that he hasnt had any changes in his stool frequency. Wife does mention that he has occasional swallowing difficulties--not with food--but sometimes his saliva. She believed he might have aspirated on his food which has likely caused his PNA, if that's what he is diagnosed with at this time. He eats solids and needs thick fluids to water down his meals. Wife brought pictures of his BL bed sores for which he is seeing Dr. Rosan.   ED Course: Labs significant for  WBC: 16.6 UA: +ve leukocytes, few bacteria  Imaging: CXR: Bibasilar opacities compatible with atelectasis or airspace disease. Possible small bilateral pleural effusions. Received : IV rocephin , azithromycin  Consulted IMTS, Neurology  Meds:  Wife reported:  Acetaminophen  1000 mg as needed for  pain Albuterol  inhaler 2 puffs every 6 hours as needed  Ascorbic acid  1000 mg daily Baclofen  10 mg daily as needed Calcium 600 mg p.o. Vitamin D  1000 units nightly Vitamin B12 500 mcg daily Lexapro  10 mg daily- Yes Feeding supplement twice daily Ibuprofen  600 mg as needed Ketoconazole  shampoo every 4 days Lamotrigine  100 mg in the morning, 100 mg at lunch, 200 mg nightly- Yes Methenamine 1000 mg twice daily Modafinil  100 mg daily- Yes Nystatin cream Ocrelizumab 600 mg every 6 months- Next dose July 31st Oxycodone -acetaminophen  (Percocet) 5-325 mg 1 tablet every 12 hours as needed Potassium 40 meq daily Pregabalin  150 mg 3 times daily- Yes  Santyl  ointment daily Theratear drops 4 times daily as  needed  Past Medical History MS, Trigeminal neuralgia, spastic quadriparesis, depression  Past Surgical History Past Surgical History:  Procedure Laterality Date   eye surgeries     x 2; bilateral 74 and 76   EYE SURGERY       Social:  Lives With: Wife who takes great care of him Occupation: Disabled  Support: Great support in his wife, nurse once a month  Level of Function: Dependent in mostly all ADLs and iADLs. Wheelchar bound, and had ceiling lifts everywhere  PCP: [facility], [place]  Substances: History of smoking tobacco and quit in 2008 was smoking about 1/2 to 1ppd for almost 19 yrs. Does not drink alcohol , used to drink beer, his last one being in 2008. No recreational drug use   Family History:  Family History  Problem Relation Age of Onset   Cancer Father    Multiple sclerosis Sister    Seizures Maternal Uncle    Parkinsonism Maternal Uncle    Multiple sclerosis Sister    Multiple sclerosis Paternal Uncle    Multiple sclerosis Other    Lung cancer Other        parent   Uterine cancer Other        other     Allergies: Allergies as of 06/25/2024 - Review Complete 06/25/2024  Allergen Reaction Noted   Gadopiclenol  Nausea And Vomiting 07/21/2022   Iodinated contrast media Nausea And Vomiting 07/19/2023    Review of Systems: A complete ROS was negative except as per HPI.   OBJECTIVE:   Physical Exam: Blood pressure 99/68, pulse 77, temperature 97.6 F (36.4 C), temperature source Oral, resp. rate 19, height 5' 10 (1.778 m), weight 77 kg, SpO2 (!) 89%.  Constitutional: Pt appeared tired in bed,  HENT: normocephalic atraumatic, mucous membranes moist Eyes: conjunctiva non-erythematous Neck: supple Cardiovascular: regular rate and rhythm, no m/r/g Pulmonary/Chest: normal work of breathing on room air, crackles in posterior lung bases Abdominal: Distended MSK: BL LE swelling Neurological: alert, answered questions, Skin: Wounds on BL buttocks--see images  below for more information. Images down below taken from today's PE.  Right Buttock   Left Buttock  Labs: CBC    Component Value Date/Time   WBC 16.6 (H) 06/25/2024 0918   RBC 4.95 06/25/2024 0918   HGB 15.0 06/25/2024 0925   HGB 10.0 (L) 06/15/2023 1526   HCT 44.0 06/25/2024 0925   HCT 32.5 (L) 06/15/2023 1526   PLT 180 06/25/2024 0918   PLT 359 06/15/2023 1526   MCV 91.7 06/25/2024 0918   MCV 86 06/15/2023 1526   MCH 29.5 06/25/2024 0918   MCHC 32.2 06/25/2024 0918   RDW 15.8 (H) 06/25/2024 0918   RDW 15.7 (H) 06/15/2023 1526   LYMPHSABS 0.6 (L) 06/25/2024 0918   LYMPHSABS 0.8  06/15/2023 1526   MONOABS 1.0 06/25/2024 0918   EOSABS 0.2 06/25/2024 0918   EOSABS 0.1 06/15/2023 1526   BASOSABS 0.0 06/25/2024 0918   BASOSABS 0.0 06/15/2023 1526     CMP     Component Value Date/Time   NA 141 06/25/2024 0925   NA 141 06/15/2023 1526   K 3.7 06/25/2024 0925   CL 105 06/25/2024 0918   CO2 25 06/25/2024 0918   GLUCOSE 90 06/25/2024 0918   BUN 12 06/25/2024 0918   BUN 15 06/15/2023 1526   CREATININE 0.74 06/25/2024 0918   CALCIUM 8.9 06/25/2024 0918   PROT 6.1 (L) 06/25/2024 0918   PROT 5.8 (L) 06/15/2023 1526   ALBUMIN 3.7 06/25/2024 0918   ALBUMIN 3.9 06/15/2023 1526   AST 11 (L) 06/25/2024 0918   ALT 8 06/25/2024 0918   ALKPHOS 98 06/25/2024 0918   BILITOT 1.2 06/25/2024 0918   BILITOT 0.3 06/15/2023 1526   GFRNONAA >60 06/25/2024 0918   GFRAA 84 07/29/2020 1429    Imaging:  DG Chest Port 1 View Result Date: 06/25/2024 EXAM: 1 VIEW XRAY OF THE CHEST 06/25/2024 09:02:00 AM COMPARISON: 04/02/2024 CLINICAL HISTORY: Cough. Reason for exam: cough; Triage notes: Patient presents to ed via [facility] from home c/o generalized bodyaches, speaking is difficult for patient he has MS and is wheelchair bound, patient is alert, per ems patient was c/o headache and having body spasms. FINDINGS: LUNGS AND PLEURA: Low lung volumes. Possible small bilateral pleural effusions.  Bibasilar opacities identified compatible with atelectasis or airspace disease. HEART AND MEDIASTINUM: No acute abnormality of the cardiac and mediastinal silhouettes. BONES AND SOFT TISSUES: No acute osseous abnormality. UPPER ABDOMEN: Large stool burden noted within the visualized colon within the upper abdomen. IMPRESSION: 1. Bibasilar opacities compatible with atelectasis or airspace disease. 2. Possible small bilateral pleural effusions. 3. Large stool burden in the visualized colon within the upper abdomen. Electronically signed by: Waddell Calk MD 06/25/2024 09:35 AM EDT RP Workstation: [id number]   CT Head Wo Contrast Result Date: 06/25/2024 CLINICAL DATA:  63 year old male with headache, body ache, wheelchair-bound. EXAM: CT HEAD WITHOUT CONTRAST TECHNIQUE: Contiguous axial images were obtained from the base of the skull through the vertex without intravenous contrast. RADIATION DOSE REDUCTION: This exam was performed according to the departmental dose-optimization program which includes automated exposure control, adjustment of the mA and/or kV according to patient size and/or use of iterative reconstruction technique. COMPARISON:  Brain MRI 11/09/2022. Head CT 10/19/2022. FINDINGS: Brain: Stable cerebral volume. No midline shift, ventriculomegaly, mass effect, evidence of mass lesion, intracranial hemorrhage or evidence of cortically based acute infarction. Gray-white matter differentiation is within normal limits throughout the brain. Vascular: No suspicious intracranial vascular hyperdensity. Faint Calcified atherosclerosis at the skull base. Skull: Intact.  No acute osseous abnormality identified. Sinuses/Orbits: Mild bilateral ethmoid sinus mucosal thickening is new. Other Visualized paranasal sinuses and mastoids are clear. Tympanic cavities appear clear. Other: Visualized orbit soft tissues are within normal limits. Visualized scalp soft tissues are within normal limits. IMPRESSION: 1. Stable  and normal for age noncontrast CT appearance of the brain. 2. Mild new bilateral ethmoid sinus inflammation. Electronically Signed   By: VEAR Hurst M.D.   On: 06/25/2024 08:59     EKG: personally reviewed my interpretation is NSM with normal axis along with artifacts. Prior EKG showed NSM with artifacts  ASSESSMENT & PLAN:   Assessment & Plan by Problem: Principal Problem:   Acute hypoxic respiratory failure (HCC) Active Problems:   Multiple  sclerosis, secondary progressive (HCC)   Neurogenic bladder   Spastic quadriparesis (HCC)   Depression   Trigeminal neuralgia   Sacral wound   Chronic indwelling Foley catheter   DONAVON KIMREY is a 63 y.o. person living with a history of MS, TN who presented with shocks on left side of his head and admitted for acute hypoxic respiratory failure 0  Principal Problem:   Acute hypoxic respiratory failure (HCC): Pt mentioned SOB that started recently. Pt appeared volume overloaded with crackles in posterior lung fields along with his BL LE swelling. His abdomen was distended which the wife mentioned was normal to him, however, it was slightly more distended this time. Although, CXR showed signs of bibasilar opacities and pt was started on abx, will check Pro-Cal to see if we need to continue antibiotics as we have low suspicion for bacterial pneumonia. Pt's O2 sats were dropping to 88% on minimal exertion. His sats improved with supplemental O2. We highly suspect HF to be causing pt's symptoms as he appeared volume overloaded on his exam, had BL pleural effusions, and with increased O2 needs. Last echo from 2021 showed EF of 55-60% with normal LV function. Will get an echo and check BNP levels. Will give pt a dose of lasix  to see if it improves his breathing. Pt's symptoms of low response could also be from infection other than PNA such as a UTI. However, suspicion is low as his UA was collected from his foley bag that had not been changed for the past month.  Although he did receive a dose of abx, will repeat UA and urine Cx after foley is changed.  -Received a dose of IV rocephin  and azithromycin  -gave a dose of IV lasix  40 mg  -F/u UA, BNP, TSH, procalcitonin - f/u echo -Strict I's and O's - Daily weights - Monitor on telemetry - Follow-up magnesium  levels -If Pro-Cal elevated, can continue antibiotics, if Pro-Cal normal, can discontinue antibiotics  Multiple sclerosis, secondary progressive (HCC) Pt has a hx of MS but wife said his flares are hard to describe. They don't cause him any active pain and his episode yesterday was not similar to his MS flares. Pt receives Ocrelizumab 600 mg every 6 months - Next dose is July 31 - No acute concern for flare at this time  - Neurology following, appreciate recommendations  Trigeminal neuralgia Pt has trigeminal neuralgia with pains that occur on his right jaw. Wife said that pt has never had TN on the left side of his face. We do think with his hx of TN, his recent sharp left-sided shocks are likely due to TN. Takes baclofen , lamotrigine .  Likely has trigeminal neuralgia flare in the setting of exacerbation of potential heart failure -Continue home baclofen  10 mg  -Continue home lamotrigine  100mg   -Continue percocet PRN  -Lyrica  150 mg  -Neurology following, appreciate recommendations  Bilateral Sacral wound (Present on admission) Pt has sacral wounds in his bilateral gluteal region. Wife showed images from march and on comparison at present (see images in PE), his wounds look a lot more healed with less bloody discharge. Pt sees Dr. Rosan for wound care OP. Will consult IP wound care. -Consulted wound care -Triamcinolone  cream in skin around buttocks   Spastic quadriparesis (HCC) Pt is bedbound and completely dependant for ADLs and iADLs. Wife mentioned that he has lower extremity paresis but has some movement in BL UE.   Chronic indwelling Foley catheter Pt has foly catheter in place.  That  is changed every month by a nurse - Order change placed to change Foley - Nurse to obtain UA after Foley exchange    Depression -Continue home lexapro  10 mg   Best practice: Diet: Normal VTE: Enoxaparin  IVF: None,None Code: DNR  Disposition planning: Prior to Admission Living Arrangement: Home  Anticipated Discharge Location: Home  Dispo: Admit patient to Observation with expected length of stay less than 2 midnights.  Signed: Edgardo Pontiff, DO Internal Medicine Resident  06/25/2024, 5:47 PM  On Call pager: [phone]

## 2024-06-25 NOTE — Plan of Care (Signed)

## 2024-06-25 NOTE — ED Provider Notes (Signed)
 [facility] [facility] AT [facility] Provider Note   CSN: [id number] Arrival date & time: 06/25/24  0700     Patient presents with: Generalized Body Aches   [name] is a 63 y.o. male.   HPI   63 year old male with complicated past medical history including MS, trigeminal neuralgia presenting with multiple complaints.  Patient is a difficult historian due to speech difficulty which waxes and wanes.  History obtained from wife at bedside.  She states over the past couple days he has been more fatigued, having generalized bodyaches, less interactive.  These are common symptoms that he gets when he gets a urinary tract infection.  Patient has also been experiencing new shocklike headaches and body spasms.  This has been going on for the past couple days, improved with his oral medication.  Last night around 6 PM was the most severe shocklike headache.  The wife describes that his whole body tensed up and that he did have an episode of diarrhea following this.  No ongoing body shaking or other findings of seizure.  He is able to confirm that he is still having generalized bodyaches and headache.  Wife is concerned that his abdomen is more distended than normal but patient denies any belly pain.  There has been no noted fever or vomiting, p.o. intake and appetite has been baseline.  She also notes that sometimes he has a difficult time clearing his throat and had concerned that he had gurgling like cough earlier which resolved.  Prior to Admission medications   Medication Sig Start Date End Date Taking? Authorizing Provider  acetaminophen  (TYLENOL ) 500 MG tablet Take 1,000 mg by mouth as needed for mild pain (pain score 1-3) or moderate pain (pain score 4-6).    [provider]  albuterol  (VENTOLIN  HFA) 108 (90 Base) MCG/ACT inhaler TAKE 2 PUFFS BY MOUTH EVERY 6 HOURS AS NEEDED Patient taking differently: Inhale 2 puffs into the lungs every 6 (six) hours as  needed for wheezing or shortness of breath. 12/15/23   Sater, Charlie LABOR, MD  ascorbic acid  (VITAMIN C ) 500 MG tablet Take 1,000 mg by mouth daily.    [provider]  baclofen  (LIORESAL ) 10 MG tablet Take 10 mg by mouth daily as needed for muscle spasms. 07/30/22   [provider]  Calcium Carbonate (CALCIUM 600 PO) Take 1 capsule by mouth at bedtime.    [provider]  Carboxymethylcellulose Sodium (THERATEARS OP) Place 1 drop into both eyes 4 (four) times daily as needed (dry eye).    [provider]  cholecalciferol  (VITAMIN D3) 25 MCG (1000 UNIT) tablet Take 1,000 Units by mouth at bedtime.    [provider]  collagenase  (SANTYL ) 250 UNIT/GM ointment Apply 1 Application topically daily. Patient not taking: Reported on 04/02/2024 06/29/23   Vear Charlie LABOR, MD  cyanocobalamin  (VITAMIN B12) 500 MCG tablet Take 500 mcg by mouth daily.    [provider]  escitalopram  (LEXAPRO ) 10 MG tablet Take 1 tablet (10 mg total) by mouth daily. 02/14/24   Sater, Charlie LABOR, MD  feeding supplement (ENSURE ENLIVE / ENSURE PLUS) LIQD Take 237 mLs by mouth 2 (two) times daily between meals. 01/03/24   Rosendo Rush, MD  ibuprofen  (ADVIL ) 200 MG tablet Take 600 mg by mouth as needed for headache or moderate pain.    [provider]  ketoconazole  (NIZORAL ) 2 % shampoo Apply 1 Application topically See admin instructions. SHAMPOO AS DIRECTED AFFECTED AREA THREE TIMES  A WEEK TO THE SCALP IN THE SHOWER LEAVE ON FOR 5 MINUTES THEN RINSE OFF. TO THE SCALP IN THE SHOWER LEAVE ON FOR 5 MINUTES THEN RINSE OFF. Every 4 days    [provider]  lamoTRIgine  (LAMICTAL ) 100 MG tablet Take 100 mg by mouth in the morning, 100 mg at lunch and 200 mg by mouth at bedtime. 12/01/23   Sater, Charlie LABOR, MD  methenamine (MANDELAMINE) 1 g tablet Take 1,000 mg by mouth 2 (two) times daily. Only takes this medication when not on an antibiotic.    [provider]   modafinil  (PROVIGIL ) 100 MG tablet Take 1 tablet by mouth every morning. 12/28/23   [provider]  nystatin cream (MYCOSTATIN) Apply 1 Application topically daily as needed for dry skin.    [provider]  ocrelizumab 600 mg in sodium chloride  0.9 % 500 mL Inject 600 mg into the vein every 6 (six) months.     [provider]  oxyCODONE -acetaminophen  (PERCOCET) 5-325 MG tablet Take 1 tablet by mouth every 12 (twelve) hours as needed for severe pain (pain score 7-10). 11/01/23 10/31/24  Sater, Charlie LABOR, MD  potassium chloride  SA (KLOR-CON  M) 20 MEQ tablet Take 2 tablets (40 mEq total) by mouth daily. Patient not taking: Reported on 04/02/2024 10/24/22 12/27/24  Mapp, Tavien, MD  pregabalin  (LYRICA ) 150 MG capsule Take 1 capsule (150 mg total) by mouth 3 (three) times daily. 01/19/24   Sater, Charlie LABOR, MD    Allergies: Gadopiclenol  and Iodinated contrast media    Review of Systems  Unable to perform ROS: Other  Constitutional:  Negative for fever.  Respiratory:  Positive for cough.   Gastrointestinal:  Positive for abdominal distention and diarrhea. Negative for abdominal pain, nausea and vomiting.  Genitourinary:        + foley  Skin:        + chronic sacral decubitus ulcers  Neurological:  Positive for headaches.    Updated Vital Signs BP (!) 109/55   Pulse 71   Temp 98 F (36.7 C) (Oral)   Resp 19   Ht 5' 10 (1.778 m)   Wt 77 kg   SpO2 97%   BMI 24.36 kg/m   Physical Exam Vitals and nursing note reviewed.  HENT:     Head: Normocephalic.     Mouth/Throat:     Mouth: Mucous membranes are moist.  Eyes:     Pupils: Pupils are equal, round, and reactive to light.  Cardiovascular:     Rate and Rhythm: Normal rate.  Pulmonary:     Effort: Pulmonary effort is normal. No respiratory distress.     Comments: Diminished at bases, scattered rales Abdominal:     General: There is distension.     Palpations: Abdomen is soft.     Tenderness: There is no  abdominal tenderness. There is no guarding.  Genitourinary:    Comments: Foley in place, yellow urine Skin:    General: Skin is warm.  Neurological:     Mental Status: He is alert.     Comments: Able to answer one-word answers   Psychiatric:        Mood and Affect: Mood normal.     (all labs ordered are listed, but only abnormal results are displayed) Labs Reviewed  RESP PANEL BY RT-PCR (RSV, FLU A&B, COVID)  [id number]  CBC WITH DIFFERENTIAL/PLATELET  COMPREHENSIVE METABOLIC PANEL WITH GFR  LIPASE, BLOOD  URINALYSIS, ROUTINE W REFLEX MICROSCOPIC  LACTIC ACID, PLASMA  LACTIC ACID, PLASMA  MAGNESIUM   I-STAT VENOUS BLOOD GAS, ED    EKG: EKG Interpretation Date/Time:  Sunday June 25 2024 07:20:22 EDT Ventricular Rate:  73 PR Interval:  172 QRS Duration:  90 QT Interval:  365 QTC Calculation: 403 R Axis:   10  Text Interpretation: Sinus rhythm Abnormal R-wave progression, early transition Confirmed by Bari Flank [phone]) on 06/25/2024 7:24:56 AM  Radiology: CT Head Wo Contrast Result Date: 06/25/2024 CLINICAL DATA:  63 year old male with headache, body ache, wheelchair-bound. EXAM: CT HEAD WITHOUT CONTRAST TECHNIQUE: Contiguous axial images were obtained from the base of the skull through the vertex without intravenous contrast. RADIATION DOSE REDUCTION: This exam was performed according to the departmental dose-optimization program which includes automated exposure control, adjustment of the mA and/or kV according to patient size and/or use of iterative reconstruction technique. COMPARISON:  Brain MRI 11/09/2022. Head CT 10/19/2022. FINDINGS: Brain: Stable cerebral volume. No midline shift, ventriculomegaly, mass effect, evidence of mass lesion, intracranial hemorrhage or evidence of cortically based acute infarction. Gray-white matter differentiation is within normal limits throughout the brain. Vascular: No suspicious intracranial vascular hyperdensity. Faint Calcified  atherosclerosis at the skull base. Skull: Intact.  No acute osseous abnormality identified. Sinuses/Orbits: Mild bilateral ethmoid sinus mucosal thickening is new. Other Visualized paranasal sinuses and mastoids are clear. Tympanic cavities appear clear. Other: Visualized orbit soft tissues are within normal limits. Visualized scalp soft tissues are within normal limits. IMPRESSION: 1. Stable and normal for age noncontrast CT appearance of the brain. 2. Mild new bilateral ethmoid sinus inflammation. Electronically Signed   By: VEAR Hurst M.D.   On: 06/25/2024 08:59     Procedures   Medications Ordered in the ED - No data to display                                  Medical Decision Making Amount and/or Complexity of Data Reviewed Labs: ordered. Radiology: ordered.  Risk Prescription drug management. Decision regarding hospitalization.   63 year old male presents emergency department with concern for body aches, increased weakness, difficulty speaking as well as shocklike headaches on the left side with body spasms.  History of MS and trigeminal neuralgia.  Patient feels like his symptoms are different than the right sided trigeminal larger that he has experienced before.  Vitals are stable.  He does have difficulty with even 1 word answers as the patient's spouse states is a change for him.  She has a concern for urinary tract infection.  Blood work shows a mild leukocytosis, lactic acid is normal, chemistry is relatively unremarkable.  Urinalysis has a large amount of leukocytes but otherwise does not seem convincing for UTI.  Respiratory panel is negative but chest x-ray showing findings of lower lung changes consistent with possible pneumonia.  This with the patient's new cough and white count, we will assume a pneumonia.  In regards to his neurologic decline and new onset left-sided headaches, consulted neurology.  Dr. Vanessa will review the patient's chart.  We feel like these  shocklike headache and symptoms may be related to progressing trigeminal neuralgia.  We feel his neurologic change/speech change and decline is probably secondary to infection/pneumonia.  Will send the urine for culture.  Will treat the patient with antibiotics for pneumonia.  Neurology will evaluate for further treatment of headaches/trigeminal neuralgia.  No acute neuroimaging recommended at this time.  Patients evaluation and results requires admission for further treatment and  care.  Spoke with hospitalist, reviewed patient's ED course and they accept admission.  Patient agrees with admission plan, offers no new complaints and is stable/unchanged at time of admit.     Final diagnoses:  None    ED Discharge Orders     None          Bari Roxie HERO, DO 06/25/24 1427

## 2024-06-25 NOTE — Hospital Course (Addendum)
 [name] was discharged from [facility] in Good condition.  At the hospital follow up visit please address:   1.  SOB, Worsening of shocks on the left side of the head 2.  Ask about BM as his imaging and abdominal distension throughout admission were concerning for constipation.  3. Home O2 supply can be discontinued if pt is satting well on RA and there is no further indication for it.    2.  Labs / imaging needed at time of follow-up: KUB for constipation   3.  Pending labs/ test needing follow-up: none   Gen: A&Ox3, in NAD Abdomen: Distended but mildly soft on the upper quadrants Cardiovascular: RRR-no m/r/g Pulmonary: CTA-B Psych: Normal Affect    [name] is a 63 y.o. person living with a history of MS, TN who presented with shocks on left side of his head and admitted for acute hypoxic respiratory failure.    Principal Problem: Acute hypoxic respiratory failure (HCC): On admission, the pt said it was his SOB and shocks on the left side of the head that bothered him the most. His SOB had started recently. He was given a dose of IV rocephin  and azithromycin  in the ED as his CXR showed concerns of PNA with bibasilar opacities and bilateral pleural effusions. On PE, pt appeared volume overloaded with swelling in BL LE and crackles in posterior lung fields. Pt also required supplemental O2 as hos O2 sats were dropping to 88% on RA with minimal exertion. Pt denied any fevers, new cough with sputum production. However, he had elevated WBC count. Initially we suspected HF to be the cause of his symptoms, however, his BNP levels came back normal and his echo showed an EF of 55-60% with normal systolic and diastolic function of LV and RV. Procal levels indicated restarting pt back on abx. Pt received 3 days of IV rocephin  and azithromycin . His symptoms improved with each day and leukocytosis resolved, however, his O2 requirements went up to 3L as his O2 saturation  levels were dropping to 88% on 2L. On discharge evaluation, pt denied any complaints of SOB. He denied any fevers, chill, or acute complaints. We transitioned him to PO Augmentin  and azithromycin . He was stable to be discharged. Pt to take a 2 day course of Augmentin  875-125 mg BID and Azithromycin  250 mg once daily upon discharge to complete 7-day course of abx. Pt also discharged with a supply of supplemental O2.   Trigeminal neuralgia Pt came in primarily for shock like pain on the left side of the head that had started the night before his admission. He was accompanied by his wife who did most of the talking as pt was very weak. Wife mentioned that pt has trigeminal neuralgia with pains that occur on his right jaw. Wife said that pt has never had TN on the left side of his face. Due to his hx of TN, his recent sharp left-sided shocks were likely due to a TN flare on a different location of his face. Per wife, pt took baclofen , lamotrigine , lyrica  at home. We continued treating him with home lamotrigine  100mg , percocet PRN, baclofen  10 mg, Lyrica  150 mg. He only had one more episode of shocks on the left side of his head during the beginning of his admission which subsided later. Pt did not complain about shocks on the left side of the head on d/c evaluation.   Multiple sclerosis, secondary progressive (HCC) Pt has a hx of  MS but wife said his flares are hard to describe. They don't cause him any active pain and his shocks on the head were not similar to his MS flares. Pt receives Ocrelizumab 600 mg every 6 months and his next dose is July 31. We were not concerned for an acute flare that was causing his symptoms.  Large Stool Burden CXR on admission showed large stool burden in the visualized colon of upper abdomen. Wife mentioned pt had no changes in BM--normally has 2 BM per day and receives enemas when he is not able to which helps him. Explained to wife that enemas don't have much effect on upper  abdomen. Pt started on senna, increased dose of miralax , and Mg citrate to help with complete BM. He also received sorbitol , dulcolax to help with his constipation. Pt's abdomen was distended when he came in and appeared a bit soft on discharge day evaluation. However, we were still concerned about his distension and recommended him to continue with sorbitol  at home along with linzess  as he had tried linzess  before.    Concerns of UTI Pt has a hx of UTI, so we were concerned about him having a potential UTI initially. This was because his wife mentioned his low responsiveness after the shock-episodes was similar to his behavior during episodes of UTIs in the past. His initial UA was drawn from a foley bag that hadn't been changed in a month. Repeat UA after foley was changed showed no concern for a UTI with the pt having no symtoms. However, cx from initial urine sample came back positive for pseudomonas. We do think urine cx results were a reflection of him being colonized by P. Aeruginosa and not a true infection as pt had no systemic symtoms of fevers or leukocytosis.     Bilateral Sacral wound (Present on admission) Pt has sacral wounds in his bilateral gluteal region. Wife showed images from march and on comparison during admission (see images in H&P PE), his wounds looked a lot more healed with less bloody discharge on admission. Pt sees Dr. Rosan for wound care OP. Consulted WC once. Pt received Triamcinolone  and ketoconazole  cream in skin around buttocks. Pt to f/u with Dr. Rosan for [facility].    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 Wife mentioned that he has lower extremity paresis but has some movement in BL UE.   Chronic indwelling Foley catheter Pt has foly catheter in place. That is changed every month by a nurse. His catheter was changed on admission.      Depression Continued home lexapro  10 mg

## 2024-06-25 NOTE — Consult Note (Addendum)
 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 Rosan ordered Triamcinolone  cream Dressing procedure/placement/frequency:  Cleanse L glute wound with Vashe wound cleanser, do not rinse and allow to air dry. Using a Q tip applicator apply Dakins moistened gauze to wound bed daily making sure to cover area of tunneling at 12 o'clock and any area of undermining.  Cover with dry gauze and ABD pad/tape or silicone foam whichever is preferred.  Cleanse R glute wound with Vashe, do not rinse and allow to air dry. Using a Q tip applicator insert silver hydrofiber (Aquacel ANDRIA Collum [id number]) into wound bed daily and cover with silicone foam or ABD pad whichever is preferred.  Will write for Triamcinolone  and Ketoconazole  creams to be applied to surrounding skin daily as per Dr. Deleta instructions.   Patient should be placed on a low air loss mattress for pressure redistribution and moisture management.   POC discussed with bedside nurse. WOC team will not follow. Re-consult if further needs arise.   Thank you,    Powell Bar MSN, RN-BC, [employer]

## 2024-06-25 NOTE — ED Triage Notes (Signed)
 Patient presents to ed via [facility] from home c/o generalized bodyaches ,  speaking is difficult for patient he has MS and is wheelchair bound , patient is alert , per ems patient was c/o headache and having body spasms

## 2024-06-26 ENCOUNTER — Inpatient Hospital Stay (HOSPITAL_COMMUNITY)

## 2024-06-26 ENCOUNTER — Observation Stay (HOSPITAL_COMMUNITY)

## 2024-06-26 DIAGNOSIS — G35 Multiple sclerosis: Secondary | ICD-10-CM | POA: Diagnosis present

## 2024-06-26 DIAGNOSIS — Z66 Do not resuscitate: Secondary | ICD-10-CM | POA: Diagnosis present

## 2024-06-26 DIAGNOSIS — Z888 Allergy status to other drugs, medicaments and biological substances status: Secondary | ICD-10-CM | POA: Diagnosis not present

## 2024-06-26 DIAGNOSIS — I509 Heart failure, unspecified: Secondary | ICD-10-CM | POA: Diagnosis not present

## 2024-06-26 DIAGNOSIS — K5900 Constipation, unspecified: Secondary | ICD-10-CM | POA: Diagnosis present

## 2024-06-26 DIAGNOSIS — L89314 Pressure ulcer of right buttock, stage 4: Secondary | ICD-10-CM | POA: Diagnosis present

## 2024-06-26 DIAGNOSIS — I7090 Unspecified atherosclerosis: Secondary | ICD-10-CM | POA: Diagnosis not present

## 2024-06-26 DIAGNOSIS — L89324 Pressure ulcer of left buttock, stage 4: Secondary | ICD-10-CM | POA: Diagnosis present

## 2024-06-26 DIAGNOSIS — L89159 Pressure ulcer of sacral region, unspecified stage: Secondary | ICD-10-CM | POA: Diagnosis not present

## 2024-06-26 DIAGNOSIS — Z91041 Radiographic dye allergy status: Secondary | ICD-10-CM | POA: Diagnosis not present

## 2024-06-26 DIAGNOSIS — Z87891 Personal history of nicotine dependence: Secondary | ICD-10-CM | POA: Diagnosis not present

## 2024-06-26 DIAGNOSIS — J189 Pneumonia, unspecified organism: Secondary | ICD-10-CM

## 2024-06-26 DIAGNOSIS — G8250 Quadriplegia, unspecified: Secondary | ICD-10-CM | POA: Diagnosis present

## 2024-06-26 DIAGNOSIS — Z87440 Personal history of urinary (tract) infections: Secondary | ICD-10-CM | POA: Diagnosis not present

## 2024-06-26 DIAGNOSIS — Z820 Family history of epilepsy and other diseases of the nervous system: Secondary | ICD-10-CM | POA: Diagnosis not present

## 2024-06-26 DIAGNOSIS — F32A Depression, unspecified: Secondary | ICD-10-CM | POA: Diagnosis present

## 2024-06-26 DIAGNOSIS — E860 Dehydration: Secondary | ICD-10-CM | POA: Diagnosis present

## 2024-06-26 DIAGNOSIS — Z7401 Bed confinement status: Secondary | ICD-10-CM | POA: Diagnosis not present

## 2024-06-26 DIAGNOSIS — J9601 Acute respiratory failure with hypoxia: Secondary | ICD-10-CM | POA: Diagnosis present

## 2024-06-26 DIAGNOSIS — G500 Trigeminal neuralgia: Secondary | ICD-10-CM | POA: Diagnosis present

## 2024-06-26 DIAGNOSIS — Z79899 Other long term (current) drug therapy: Secondary | ICD-10-CM | POA: Diagnosis not present

## 2024-06-26 DIAGNOSIS — N319 Neuromuscular dysfunction of bladder, unspecified: Secondary | ICD-10-CM | POA: Diagnosis present

## 2024-06-26 DIAGNOSIS — J90 Pleural effusion, not elsewhere classified: Secondary | ICD-10-CM | POA: Diagnosis present

## 2024-06-26 DIAGNOSIS — K5641 Fecal impaction: Secondary | ICD-10-CM | POA: Diagnosis not present

## 2024-06-26 LAB — VAS US ABI WITH/WO TBI
Left ABI: 1.16
Right ABI: 1.26

## 2024-06-26 LAB — CBC
HCT: 43.6 % (ref 39.0–52.0)
Hemoglobin: 14 g/dL (ref 13.0–17.0)
MCH: 29.4 pg (ref 26.0–34.0)
MCHC: 32.1 g/dL (ref 30.0–36.0)
MCV: 91.4 fL (ref 80.0–100.0)
Platelets: 181 K/uL (ref 150–400)
RBC: 4.77 MIL/uL (ref 4.22–5.81)
RDW: 15.8 % — ABNORMAL HIGH (ref 11.5–15.5)
WBC: 8.4 K/uL (ref 4.0–10.5)
nRBC: 0 % (ref 0.0–0.2)

## 2024-06-26 LAB — ECHOCARDIOGRAM COMPLETE
Area-P 1/2: 4.02 cm2
Calc EF: 66.3 %
Height: 70 in
S' Lateral: 2.4 cm
Single Plane A2C EF: 66 %
Single Plane A4C EF: 66.8 %
Weight: 3054.69 [oz_av]

## 2024-06-26 LAB — BASIC METABOLIC PANEL WITH GFR
Anion gap: 12 (ref 5–15)
BUN: 14 mg/dL (ref 8–23)
CO2: 29 mmol/L (ref 22–32)
Calcium: 8.8 mg/dL — ABNORMAL LOW (ref 8.9–10.3)
Chloride: 101 mmol/L (ref 98–111)
Creatinine, Ser: 0.87 mg/dL (ref 0.61–1.24)
GFR, Estimated: >60 mL/min (ref >60)
Glucose, Bld: 70 mg/dL (ref 70–99)
Potassium: 3.4 mmol/L — ABNORMAL LOW (ref 3.5–5.1)
Sodium: 142 mmol/L (ref 135–145)

## 2024-06-26 LAB — MAGNESIUM: Magnesium: 1.8 mg/dL (ref 1.7–2.4)

## 2024-06-26 LAB — MRSA NEXT GEN BY PCR, NASAL: MRSA by PCR Next Gen: DETECTED — AB

## 2024-06-26 MED ORDER — POTASSIUM CHLORIDE 20 MEQ PO PACK
40.0000 meq | PACK | Freq: Once | ORAL | Status: DC
Start: 2024-06-26 — End: 2024-06-30
  Filled 2024-06-26: qty 2

## 2024-06-26 MED ORDER — CEFTRIAXONE SODIUM 2 G IJ SOLR
2.0000 g | Freq: Once | INTRAMUSCULAR | Status: AC
  Administered 2024-06-26: 2 g via INTRAVENOUS
  Filled 2024-06-26: qty 20

## 2024-06-26 MED ORDER — SODIUM CHLORIDE 0.9 % IV SOLN
500.0000 mg | Freq: Once | INTRAVENOUS | Status: AC
  Administered 2024-06-26: 500 mg via INTRAVENOUS
  Filled 2024-06-26: qty 5

## 2024-06-26 MED ORDER — SENNOSIDES-DOCUSATE SODIUM 8.6-50 MG PO TABS
1.0000 | ORAL_TABLET | Freq: Two times a day (BID) | ORAL | Status: DC
  Administered 2024-06-26 – 2024-06-30 (×9): 1 via ORAL
  Filled 2024-06-26 (×9): qty 1

## 2024-06-26 MED ORDER — MAGNESIUM CITRATE PO SOLN
1.0000 | Freq: Once | ORAL | Status: AC
Start: 2024-06-26 — End: 2024-06-26
  Administered 2024-06-26: 1 via ORAL
  Filled 2024-06-26: qty 296

## 2024-06-26 MED ORDER — PERFLUTREN LIPID MICROSPHERE
1.0000 mL | INTRAVENOUS | Status: AC | PRN
  Administered 2024-06-26: 2 mL via INTRAVENOUS

## 2024-06-26 MED ORDER — POTASSIUM CHLORIDE CRYS ER 20 MEQ PO TBCR
40.0000 meq | EXTENDED_RELEASE_TABLET | Freq: Once | ORAL | Status: AC
Start: 2024-06-26 — End: 2024-06-26
  Administered 2024-06-26: 40 meq via ORAL
  Filled 2024-06-26: qty 2

## 2024-06-26 MED ORDER — POLYETHYLENE GLYCOL 3350 17 G PO PACK
17.0000 g | PACK | Freq: Every day | ORAL | Status: DC
  Administered 2024-06-26: 17 g via ORAL
  Filled 2024-06-26 (×5): qty 1

## 2024-06-26 NOTE — Evaluation (Signed)
 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 male presented to ED with acute hypoxic resp failure, TN flare - this time on left side (previously on right).  PMHx progressive MS with spastic quadriparesis on immunosuppression, chronic indwelling foley, restrictive lung dz, severe protein cal malnutrition, trigeminal neuralgia, bilateral pressure wounds buttocks. Pt's wife is his primary caregiver. He is on a regular diet with nectar-thick liquids at baseline. Has had multiple SLP evaluations in the past - most recently 12/30/23- with recs for regular/thins; however, functionally, his wife reported that he swallowed slightly thick liquids better than thins. Regular diet was recommended to allow greater range of options from the menu and to avoid triggering TN pain.    Assessment / Plan / Recommendation  Clinical Impression  Pt's swallowing function remains stable with no functional deterioration observed today or per pt/wife. He has been eating a regular diet with nectar thick liquids for several years.  Nectar liquids are more confortable; there is minimal coughing per his wife. Today there was no coughing observed; no s/s of aspiration.  He is able to self-select solids that won't exacerbate his  TN.  Recommend continuing his home diet -regular, nectars - give meds whole in applesauce.  No SLP f/u is needed. D/W pt/family. Our service will sign off. SLP Visit Diagnosis: Dysphagia, oropharyngeal phase (R13.12)    Aspiration Risk  Mild aspiration risk    Diet Recommendation   Nectar;Age appropriate regular  Medication Administration: Whole meds with puree    Other  Recommendations Oral Care Recommendations: Oral care BID       Swallow Study   General Date of Onset: 06/25/24 HPI: 63 y.o. male presented to ED with acute hypoxic resp failure, TN flare - this time on left side (previously on right).  PMHx progressive MS with spastic quadriparesis on immunosuppression, chronic indwelling foley, restrictive lung dz, severe protein cal malnutrition, trigeminal neuralgia, bilateral pressure wounds buttocks. Pt's wife is his primary caregiver. He is on a regular diet with nectar-thick liquids at baseline. Has had multiple SLP evaluations in the past - most recently 12/30/23- with recs for regular/thins; however, functionally, his wife reported that he swallowed slightly thick liquids better than thins. Regular diet was recommended to allow greater range of options from the menu and to avoid triggering TN pain. Type of Study: Bedside Swallow Evaluation Previous Swallow Assessment: See HPI Diet Prior to this Study: Regular;Extremely thick liquids (Level 4, pudding thick) Temperature Spikes Noted: No Respiratory Status: Room air History of Recent Intubation: No Behavior/Cognition: Alert;Cooperative;Pleasant mood Oral Cavity Assessment: Within Functional Limits Oral Care Completed by SLP: No Oral Cavity - Dentition: Adequate natural dentition Self-Feeding Abilities: Total assist  Patient Positioning: Upright in bed Baseline Vocal Quality: Low vocal intensity Volitional Cough: Weak Volitional Swallow: Able to elicit    Oral/Motor/Sensory Function Overall Oral Motor/Sensory Function:  Generalized oral weakness Lingual Symmetry: Abnormal symmetry right;Suspected CN XII (hypoglossal) dysfunction   Ice Chips     Thin Liquid Thin Liquid: Not tested    Nectar Thick Nectar Thick Liquid: Within functional limits   Honey Thick Honey Thick Liquid: Not tested   Puree Puree: Within functional limits   Solid     Solid: Within functional limits      Vona Palma Laurice 06/26/2024,10:10 AM  Palma L. Vona, MA CCC/SLP Clinical Specialist - Acute Care SLP Acute Rehabilitation Services Office number [phone]

## 2024-06-26 NOTE — Plan of Care (Signed)

## 2024-06-26 NOTE — Progress Notes (Addendum)
 HD#0 SUBJECTIVE:  Patient Summary: [name] is a 63 y.o. person living with a history of MS, [place] who presented with shocks on left side of his head and admitted for acute hypoxic respiratory failure   Overnight Events: None  Interim History:  Saw pt at bedside this AM. Wife mentioned that pt had similar shocks around the middle of the night. Still hasn't seen a neurologist yet here. Still having some difficulty breathing. Denies fevers, chills. Showers q4d per wife, usually has an enema to get a good stool out.    OBJECTIVE:  Vital Signs: Vitals:   06/26/24 0000 06/26/24 0500 06/26/24 0515 06/26/24 0939  BP: 92/62  109/67 100/66  Pulse: 64  62 80  Resp: 16  16 16   Temp: 97.6 F (36.4 C)  98.1 F (36.7 C) 98 F (36.7 C)  TempSrc: Oral  Oral Oral  SpO2: 96%  95% 92%  Weight:  86.6 kg    Height:       Supplemental O2: Nasal Cannula SpO2: 92 % O2 Flow Rate (L/min): 2 L/min  Filed Weights   06/25/24 0720 06/25/24 1821 06/26/24 0500  Weight: 77 kg 80 kg 86.6 kg     Intake/Output Summary (Last 24 hours) at 06/26/2024 1045 Last data filed at 06/26/2024 0515 Gross per 24 hour  Intake 98.78 ml  Output 1750 ml  Net -1651.22 ml   Net IO Since Admission: -1,651.22 mL [06/26/24 1045]  Physical Exam: Physical Exam Cardiovascular:     Rate and Rhythm: Normal rate and regular rhythm.  Pulmonary:     Effort: Pulmonary effort is normal.     Comments: Mild crackles heard in the mid-lower lung fields  Musculoskeletal:     Comments: BL LE were cold with improved swelling   Neurological:     Mental Status: He is alert.  Psychiatric:        Mood and Affect: Mood normal.     Patient Lines/Drains/Airways Status     Active Line/Drains/Airways     Name Placement date Placement time Site Days   Peripheral IV 06/25/24 20 G Anterior;Left Forearm 06/25/24  0915  Forearm  1   Urethral Catheter Augustin Carls, RN Latex 16 Fr. 06/25/24  1755  Latex  1   Pressure Injury 12/30/23  Buttocks Right Stage 3 -  Full thickness tissue loss. Subcutaneous fat may be visible but bone, tendon or muscle are NOT exposed. 12/30/23  --  -- 179   Pressure Injury 12/30/23 Buttocks Left Stage 3 -  Full thickness tissue loss. Subcutaneous fat may be visible but bone, tendon or muscle are NOT exposed. 12/30/23  2030  -- 179   Wound / Incision (Open or Dehisced) 01/03/24 Skin tear Back Posterior two small wounds along his spine. Per patient's spouse, this is exisiting that occurred from patient's wheelchair. Was covered with foam, which RN applied new foams after cleaning the  01/03/24  0800  Back  175   Wound 06/25/24 1719 Pressure Injury Buttocks Left Stage 4 - Full thickness tissue loss with exposed bone, tendon or muscle. 06/25/24  1719  Buttocks  1   Wound 06/25/24 1721 Pressure Injury Buttocks Right Stage 4 - Full thickness tissue loss with exposed bone, tendon or muscle. 06/25/24  1721  Buttocks  1            Pertinent labs and imaging:      Latest Ref Rng & Units 06/26/2024    4:15 AM 06/25/2024  9:25 AM 06/25/2024    9:18 AM  CBC  WBC 4.0 - 10.5 K/uL 8.4   16.6   Hemoglobin 13.0 - 17.0 g/dL 85.9  84.9  85.3   Hematocrit 39.0 - 52.0 % 43.6  44.0  45.4   Platelets 150 - 400 K/uL 181   180        Latest Ref Rng & Units 06/26/2024    4:15 AM 06/25/2024    9:25 AM 06/25/2024    9:18 AM  CMP  Glucose 70 - 99 mg/dL 70   90   BUN 8 - 23 mg/dL 14   12   Creatinine 9.38 - 1.24 mg/dL 9.12   9.25   Sodium 864 - 145 mmol/L 142  141  138   Potassium 3.5 - 5.1 mmol/L 3.4  3.7  3.6   Chloride 98 - 111 mmol/L 101   105   CO2 22 - 32 mmol/L 29   25   Calcium 8.9 - 10.3 mg/dL 8.8   8.9   Total Protein 6.5 - 8.1 g/dL   6.1   Total Bilirubin 0.0 - 1.2 mg/dL   1.2   Alkaline Phos 38 - 126 U/L   98   AST 15 - 41 U/L   11   ALT 0 - 44 U/L   8     No results found.  ASSESSMENT/PLAN:  Assessment: Principal Problem:   Acute hypoxic respiratory failure (HCC) Active Problems:    Multiple sclerosis, secondary progressive (HCC)   Neurogenic bladder   Spastic quadriparesis (HCC)   Depression   Trigeminal neuralgia   Sacral wound   Chronic indwelling Foley catheter   Plan: [name] is a 63 y.o. person living with a history of MS, [place] who presented with shocks on left side of his head and admitted for acute hypoxic respiratory failure   Acute hypoxic respiratory failure (HCC): Pt still has SOB and needed supplemental O2 as his O2 sats were dropping below 90%. Normal BNP levels and breathing did not improve with lasix  use--symptoms likely not due to HF. Pro cal levels indicate restarting abx for LRTI. Pt likely has PNA which could be community acquired vs aspiration. Got swallow study for further evaluation on potential aspiration. Swallow study negative for any s/s for aspiration and SLP recommended continuing with regular diet for the pt. Started pt on IV azithromycin  and rocephin . BL LE swelling had improved, however, both extremities were cold. Pt doesn't have a previous hx of PAD, will get ABIs for evaluation. Repeat UA was not concerning for a UTI.   -Restarted IV rocephin  and azithromycin  -ABIs ordered  - f/u echo - flutter valve for pulmonary hygiene   Trigeminal neuralgia Pt has trigeminal neuralgia with pains that occur on his right jaw. His current shocks on the left side of the head are likely due to his [place]. Will continue home medications and follow neurology recommendations.  -Continue home baclofen  10 mg  -Continue home lamotrigine  100mg   -Continue percocet PRN  -Lyrica  150 mg  -Neurology following, appreciate recommendations   Multiple sclerosis, secondary progressive (HCC) Pt has a hx of MS but wife said his flares are hard to describe. They don't cause him any active pain and his episode with shocks on the left side of the head was not similar to his MS flares.  - Receives Ocrelizumab every 6 months; Next dose is July 31 - No acute concern  for flare at this time  - Neurology following,  appreciate recommendations   Bilateral Sacral wound (Present on admission) Pt has sacral wounds in his bilateral gluteal region.  Pt sees Dr. Rosan for wound care OP. Wound care was consulted yesterday, will reach out for further consultation if needed.  -Triamcinolone  and ketoconazole  cream in skin around buttocks  Large Stool Burden CXR showed large stool burden in the visualized colon of upper abdomen. Wife mentioned pt had no changes in BM--normally has 2 BM per day and receives enemas when he is not able to which helps him. Explained to wife that enemas don't have much effect on upper abdomen. Pt started on senna, increased dose of miralax , and Mg citrate to help with complete BM. -- Miralax , senna, Mg citrate started    Spastic quadriparesis (HCC) Pt is bedbound and completely dependant for ADLs and iADLs. Wife mentioned that he has lower extremity paresis but has some movement in BL UE.    Chronic indwelling Foley catheter Pt has foly catheter in place that is changed every month by a nurse. Catheter changed yesterday. Repeat UA was obtained after catheter change   -New catheter in place      Depression -Continue home lexapro  10 mg   Best Practice: Diet: Normal VTE: Enoxaparin  IVF: None,None Code: DNR   Disposition planning: Prior to Admission Living Arrangement: Home  Anticipated Discharge Location: Home   Dispo: Admit patient to Observation with expected length of stay less than 2 midnights.  Signature:  Rebecka Edgardo [facility]  10:45 AM, 06/26/2024  On Call pager [phone]

## 2024-06-27 ENCOUNTER — Inpatient Hospital Stay (HOSPITAL_COMMUNITY)

## 2024-06-27 DIAGNOSIS — K5641 Fecal impaction: Secondary | ICD-10-CM

## 2024-06-27 DIAGNOSIS — G35 Multiple sclerosis: Secondary | ICD-10-CM | POA: Diagnosis not present

## 2024-06-27 DIAGNOSIS — J9601 Acute respiratory failure with hypoxia: Secondary | ICD-10-CM | POA: Diagnosis not present

## 2024-06-27 DIAGNOSIS — G500 Trigeminal neuralgia: Secondary | ICD-10-CM | POA: Diagnosis not present

## 2024-06-27 LAB — CBC WITH DIFFERENTIAL/PLATELET
Abs Immature Granulocytes: 0.01 K/uL (ref 0.00–0.07)
Basophils Absolute: 0 K/uL (ref 0.0–0.1)
Basophils Relative: 1 %
Eosinophils Absolute: 0.4 K/uL (ref 0.0–0.5)
Eosinophils Relative: 5 %
HCT: 42.8 % (ref 39.0–52.0)
Hemoglobin: 13.8 g/dL (ref 13.0–17.0)
Immature Granulocytes: 0 %
Lymphocytes Relative: 10 %
Lymphs Abs: 0.8 K/uL (ref 0.7–4.0)
MCH: 29.6 pg (ref 26.0–34.0)
MCHC: 32.2 g/dL (ref 30.0–36.0)
MCV: 91.6 fL (ref 80.0–100.0)
Monocytes Absolute: 0.9 K/uL (ref 0.1–1.0)
Monocytes Relative: 11 %
Neutro Abs: 5.9 K/uL (ref 1.7–7.7)
Neutrophils Relative %: 73 %
Platelets: 206 K/uL (ref 150–400)
RBC: 4.67 MIL/uL (ref 4.22–5.81)
RDW: 15.8 % — ABNORMAL HIGH (ref 11.5–15.5)
WBC: 8 K/uL (ref 4.0–10.5)
nRBC: 0 % (ref 0.0–0.2)

## 2024-06-27 LAB — BASIC METABOLIC PANEL WITH GFR
Anion gap: 15 (ref 5–15)
BUN: 20 mg/dL (ref 8–23)
CO2: 25 mmol/L (ref 22–32)
Calcium: 8.9 mg/dL (ref 8.9–10.3)
Chloride: 105 mmol/L (ref 98–111)
Creatinine, Ser: 0.86 mg/dL (ref 0.61–1.24)
GFR, Estimated: >60 mL/min (ref >60)
Glucose, Bld: 75 mg/dL (ref 70–99)
Potassium: 3.9 mmol/L (ref 3.5–5.1)
Sodium: 145 mmol/L (ref 135–145)

## 2024-06-27 MED ORDER — SORBITOL 70 % SOLN
45.0000 mL | Freq: Once | Status: AC
  Administered 2024-06-27: 45 mL via ORAL
  Filled 2024-06-27: qty 60

## 2024-06-27 MED ORDER — SODIUM CHLORIDE 0.9 % IV SOLN
2.0000 g | Freq: Once | INTRAVENOUS | Status: AC
  Administered 2024-06-27: 2 g via INTRAVENOUS
  Filled 2024-06-27: qty 20

## 2024-06-27 MED ORDER — SODIUM CHLORIDE 0.9 % IV SOLN
500.0000 mg | Freq: Once | INTRAVENOUS | Status: AC
  Administered 2024-06-27: 500 mg via INTRAVENOUS
  Filled 2024-06-27: qty 5

## 2024-06-27 MED ORDER — BISACODYL 5 MG PO TBEC
10.0000 mg | DELAYED_RELEASE_TABLET | Freq: Once | ORAL | Status: AC
Start: 2024-06-27 — End: 2024-06-27
  Administered 2024-06-27: 10 mg via ORAL
  Filled 2024-06-27: qty 2

## 2024-06-27 MED ORDER — SORBITOL 70 % SOLN
30.0000 mL | Freq: Once | Status: AC
  Administered 2024-06-27: 30 mL via ORAL
  Filled 2024-06-27: qty 30

## 2024-06-27 NOTE — Plan of Care (Signed)

## 2024-06-27 NOTE — Progress Notes (Signed)
 HD#1 SUBJECTIVE:  Patient Summary:[name] is a 63 y.o. person living with a history of MS, [place] who presented with shocks on left side of his head and admitted for acute hypoxic respiratory failure   Overnight Events: none  Interim History:  Saw pt at bedside this AM. Accompanied by wife. Pt said he is feeling better and breathing is fine. Denied any fevers, chills. Wife mentioned pt has been having clear BM since yesterday. She mentioned his stools are usually clear, brown or a mix of both. He had half his miralax  and magnesium  citrate yesterday. Pt reported he did not have any episodes of shocks on the left side of his head yesterday or this morning.   OBJECTIVE:  Vital Signs: Vitals:   06/27/24 0104 06/27/24 0500 06/27/24 0539 06/27/24 0852  BP: 95/69  114/69 108/65  Pulse: 74  71 70  Resp: 17  17 17   Temp: 98.3 F (36.8 C)  97.9 F (36.6 C) 97.9 F (36.6 C)  TempSrc: Oral  Oral   SpO2: 91%  92% 93%  Weight:  86.4 kg    Height:       Supplemental O2: Nasal Cannula SpO2: 93 % O2 Flow Rate (L/min): 3 L/min  Filed Weights   06/25/24 1821 06/26/24 0500 06/27/24 0500  Weight: 80 kg 86.6 kg 86.4 kg     Intake/Output Summary (Last 24 hours) at 06/27/2024 1006 Last data filed at 06/27/2024 0600 Gross per 24 hour  Intake --  Output 700 ml  Net -700 ml   Net IO Since Admission: -2,351.22 mL [06/27/24 1006]  Physical Exam: Physical Exam HENT:     Head: Normocephalic.  Cardiovascular:     Rate and Rhythm: Normal rate and regular rhythm.  Pulmonary:     Effort: Pulmonary effort is normal.     Breath sounds: Normal breath sounds.  Abdominal:     General: There is distension.     Tenderness: There is no abdominal tenderness.     Comments: Distended abdomen; hypoactive BS  Neurological:     Mental Status: He is alert.     Patient Lines/Drains/Airways Status     Active Line/Drains/Airways     Name Placement date Placement time Site Days   Peripheral IV  06/25/24 20 G Anterior;Left Forearm 06/25/24  0915  Forearm  2   Urethral Catheter Augustin Carls, RN Latex 16 Fr. 06/25/24  1755  Latex  2   Pressure Injury 12/30/23 Buttocks Right Stage 3 -  Full thickness tissue loss. Subcutaneous fat may be visible but bone, tendon or muscle are NOT exposed. 12/30/23  --  -- 180   Pressure Injury 12/30/23 Buttocks Left Stage 3 -  Full thickness tissue loss. Subcutaneous fat may be visible but bone, tendon or muscle are NOT exposed. 12/30/23  2030  -- 180   Wound / Incision (Open or Dehisced) 01/03/24 Skin tear Back Posterior two small wounds along his spine. Per patient's spouse, this is exisiting that occurred from patient's wheelchair. Was covered with foam, which RN applied new foams after cleaning the  01/03/24  0800  Back  176   Wound 06/25/24 1719 Pressure Injury Buttocks Left Stage 4 - Full thickness tissue loss with exposed bone, tendon or muscle. 06/25/24  1719  Buttocks  2   Wound 06/25/24 1721 Pressure Injury Buttocks Right Stage 4 - Full thickness tissue loss with exposed bone, tendon or muscle. 06/25/24  1721  Buttocks  2  Pertinent labs and imaging:      Latest Ref Rng & Units 06/27/2024    5:37 AM 06/26/2024    4:15 AM 06/25/2024    9:25 AM  CBC  WBC 4.0 - 10.5 K/uL 8.0  8.4    Hemoglobin 13.0 - 17.0 g/dL 86.1  85.9  84.9   Hematocrit 39.0 - 52.0 % 42.8  43.6  44.0   Platelets 150 - 400 K/uL 206  181         Latest Ref Rng & Units 06/27/2024    5:37 AM 06/26/2024    4:15 AM 06/25/2024    9:25 AM  CMP  Glucose 70 - 99 mg/dL 75  70    BUN 8 - 23 mg/dL 20  14    Creatinine 9.38 - 1.24 mg/dL 9.13  9.12    Sodium 864 - 145 mmol/L 145  142  141   Potassium 3.5 - 5.1 mmol/L 3.9  3.4  3.7   Chloride 98 - 111 mmol/L 105  101    CO2 22 - 32 mmol/L 25  29    Calcium 8.9 - 10.3 mg/dL 8.9  8.8      VAS US  ABI WITH/WO TBI Result Date: 06/26/2024  LOWER EXTREMITY DOPPLER STUDY Patient Name:  [name]  Date of Exam:    06/26/2024 Medical Rec #: [id number]        Accession #:    [id number] Date of Birth: 02-13-1961        Patient Gender: M Patient Age:   59 years Exam Location:  [facility] Procedure:      VAS US  ABI WITH/WO TBI Referring Phys: ELSIE SAVANNAH --------------------------------------------------------------------------------  Indications: Cold and Swollen lower extremities.  Comparison Study: No prior exam. Performing Technologist: Edilia Elden Appl  Examination Guidelines: A complete evaluation includes at minimum, Doppler waveform signals and systolic blood pressure reading at the level of bilateral brachial, anterior tibial, and posterior tibial arteries, when vessel segments are accessible. Bilateral testing is considered an integral part of a complete examination. Photoelectric Plethysmograph (PPG) waveforms and toe systolic pressure readings are included as required and additional duplex testing as needed. Limited examinations for reoccurring indications may be performed as noted.  ABI Findings: +---------+------------------+-----+---------+--------+ Right    Rt Pressure (mmHg)IndexWaveform Comment  +---------+------------------+-----+---------+--------+ Brachial 106                    triphasic         +---------+------------------+-----+---------+--------+ PTA      136               1.26 triphasic         +---------+------------------+-----+---------+--------+ DP       127               1.18 triphasic         +---------+------------------+-----+---------+--------+ Great Toe254               2.35 Normal            +---------+------------------+-----+---------+--------+ +---------+------------------+-----+---------+-------+ Left     Lt Pressure (mmHg)IndexWaveform Comment +---------+------------------+-----+---------+-------+ Brachial 108                    triphasic        +---------+------------------+-----+---------+-------+ PTA      125               1.16  triphasic        +---------+------------------+-----+---------+-------+ DP  119               1.10 triphasic        +---------+------------------+-----+---------+-------+ Great Toe254               2.35 Normal           +---------+------------------+-----+---------+-------+ +-------+-----------+-----------+------------+------------+ ABI/TBIToday's ABIToday's TBIPrevious ABIPrevious TBI +-------+-----------+-----------+------------+------------+ Right  1.26       [place]                                  +-------+-----------+-----------+------------+------------+ Left   1.16       [place]                                  +-------+-----------+-----------+------------+------------+  Summary: Right: Resting right ankle-brachial index is within normal range. The right toe-brachial index is abnormal.  Left: Resting left ankle-brachial index is within normal range. The left toe-brachial index is abnormal.  *See table(s) above for measurements and observations.  Electronically signed by Lonni Gaskins MD on 06/26/2024 at 4:34:52 PM.    Final    ECHOCARDIOGRAM COMPLETE Result Date: 06/26/2024    ECHOCARDIOGRAM REPORT   Patient Name:   Holman RAMIREZ FULLBRIGHT Date of Exam: 06/26/2024 Medical Rec #:  [id number]       Height:       70.0 in Accession #:    [id number]      Weight:       190.9 lb Date of Birth:  January 02, 1961       BSA:          2.047 m Patient Age:    63 years        BP:           100/66 mmHg Patient Gender: M               HR:           86 bpm. Exam Location:  Inpatient Procedure: 2D Echo, Cardiac Doppler, Color Doppler and Intracardiac            Opacification Agent (Both Spectral and Color Flow Doppler were            utilized during procedure). 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  Signs/Symptoms:Bacteremia, Shortness of Breath                 and Dyspnea. Multiple sclerosis.  Sonographer:    Ellouise Mose RDCS Referring Phys: [id number] ELSIE NOVAK [facility]  Sonographer Comments: Technically difficult study due to poor echo windows, suboptimal subcostal window and suboptimal parasternal window. Image acquisition challenging due to patient body habitus. IMPRESSIONS  1. Left ventricular ejection fraction, by estimation, is 55 to 60%. Left ventricular ejection fraction by 2D MOD biplane is 66.3 %. Left ventricular ejection fraction by PLAX is 58 %. The left ventricle has normal function. The left ventricle has no regional wall motion abnormalities. Left ventricular diastolic parameters were normal.  2. Right ventricular systolic function is normal. The right ventricular size is normal.  3.  The mitral valve is normal in structure. No evidence of mitral valve regurgitation. No evidence of mitral stenosis.  4. The aortic valve is tricuspid. Aortic valve regurgitation is not visualized. No aortic stenosis is present. FINDINGS  Left Ventricle: Left ventricular ejection fraction, by estimation, is 55 to 60%. Left ventricular ejection fraction by PLAX is 58 %. Left ventricular ejection fraction by 2D MOD biplane is 66.3 %. The left ventricle has normal function. The left ventricle has no regional wall motion abnormalities. Definity  contrast agent was given IV to delineate the left ventricular endocardial borders. The left ventricular internal cavity size was normal in size. There is no left ventricular hypertrophy. Left ventricular diastolic parameters were normal. Right Ventricle: The right ventricular size is normal. No increase in right ventricular wall thickness. Right ventricular systolic function is normal. Left Atrium: Left atrial size was normal in size. Right Atrium: Right atrial size was normal in size. Pericardium: There is no evidence of pericardial effusion. Mitral Valve: The mitral valve is normal  in structure. No evidence of mitral valve regurgitation. No evidence of mitral valve stenosis. Tricuspid Valve: The tricuspid valve is normal in structure. Tricuspid valve regurgitation is not demonstrated. No evidence of tricuspid stenosis. Aortic Valve: The aortic valve is tricuspid. Aortic valve regurgitation is not visualized. No aortic stenosis is present. Pulmonic Valve: The pulmonic valve was not well visualized. Pulmonic valve regurgitation is not visualized. No evidence of pulmonic stenosis. Aorta: The aortic root and ascending aorta are structurally normal, with no evidence of dilitation. IAS/Shunts: No atrial level shunt detected by color flow Doppler.  LEFT VENTRICLE PLAX 2D                        Biplane EF (MOD) LV EF:         Left            LV Biplane EF:   Left                ventricular                      ventricular                ejection                         ejection                fraction by                      fraction by                PLAX is 58                       2D MOD                %.                               biplane is LVIDd:         3.40 cm                          66.3 %. LVIDs:         2.40 cm LV PW:         1.20  cm         Diastology LV IVS:        1.10 cm         LV e' medial:    11.10 cm/s LVOT diam:     2.40 cm         LV E/e' medial:  4.4 LV SV:         66              LV e' lateral:   9.25 cm/s LV SV Index:   32              LV E/e' lateral: 5.2 LVOT Area:     4.52 cm  LV Volumes (MOD) LV vol d, MOD    95.8 ml A2C: LV vol d, MOD    62.0 ml A4C: LV vol s, MOD    32.6 ml A2C: LV vol s, MOD    20.6 ml A4C: LV SV MOD A2C:   63.2 ml LV SV MOD A4C:   62.0 ml LV SV MOD BP:    52.2 ml RIGHT VENTRICLE             IVC RV S prime:     11.40 cm/s  IVC diam: 1.80 cm TAPSE (M-mode): 1.7 cm LEFT ATRIUM             Index        RIGHT ATRIUM           Index LA diam:        3.40 cm 1.66 cm/m   RA Area:     10.40 cm LA Vol (A2C):   30.3 ml 14.81 ml/m  RA Volume:   15.30 ml   7.48 ml/m LA Vol (A4C):   26.3 ml 12.85 ml/m LA Biplane Vol: 31.1 ml 15.20 ml/m  AORTIC VALVE LVOT Vmax:   95.40 cm/s LVOT Vmean:  58.500 cm/s LVOT VTI:    0.146 m  AORTA Ao Asc diam: 3.40 cm MITRAL VALVE MV Area (PHT): 4.02 cm    SHUNTS MV Decel Time: 189 msec    Systemic VTI:  0.15 m MV E velocity: 48.45 cm/s  Systemic Diam: 2.40 cm MV A velocity: 76.50 cm/s MV E/A ratio:  0.63 Aditya Sabharwal Electronically signed by Ria Commander Signature Date/Time: 06/26/2024/4:30:31 PM    Final     ASSESSMENT/PLAN:  Assessment: Principal Problem:   Acute hypoxic respiratory failure (HCC) Active Problems:   Multiple sclerosis, secondary progressive (HCC)   Neurogenic bladder   Spastic quadriparesis (HCC)   Depression   Trigeminal neuralgia   Sacral wound   Chronic indwelling Foley catheter   Pneumonia due to infectious organism   Plan: [name] is a 63 y.o. person living with a history of MS, [place] who presented with shocks on left side of his head and admitted for acute hypoxic respiratory failure    Acute hypoxic respiratory failure (HCC): Pt was feeling well on examination with improved SOB. However, his supplemental O2 requirements went up from 2L to 3 L last night as he was satting at 88%. Denied any fevers. MRSA nares tested positive, however, pt looks well with no systemic signs of MRSA infection. Will continue his IV abx today and will consider transitioning to PO abx tomorrow if breathing improves and supplemental O2 requirements go down.    -On IV rocephin  and azithromycin : Day 3 - flutter valve for pulmonary hygiene  Large Stool Burden CXR showed large stool burden in  the visualized colon of upper abdomen, on admission. He was able to have only have half of miralax  and mg citrate that was given to him yesterday. Abdomen was distended today as well. Will give him a dose of sorbitol  and get a KUB.  -ordered a dose of Sorbitol  -Ordered KUB -Continue daily Miralax , senna    Trigeminal neuralgia Pt has trigeminal neuralgia with pains that occur on his right jaw. His current shocks on the left side of the head are likely due to his [place]. Currently, pt has had no episodes of shocks on the left side of his head. Will continue home medications.   -Continue home lamotrigine  100mg   -Continue percocet PRN  -Lyrica  150 mg     Chronic Conditions:   Multiple sclerosis, secondary progressive (HCC) Pt has a hx of MS but wife said his flares are hard to describe. They don't cause him any active pain and his episode with shocks on the left side of the head was not similar to his MS flares.  - Receives Ocrelizumab every 6 months; Next dose is July 31 - No acute concern for flare at this time    Bilateral Sacral wound (Present on admission) Pt has sacral wounds in his bilateral gluteal region.  Pt sees Dr. Rosan for wound care OP.  Will reach out for further consultation with WC, if required.  -Triamcinolone  and ketoconazole  cream in skin around buttocks   Spastic quadriparesis (HCC) Pt is bedbound and completely dependant for ADLs and iADLs. Wife mentioned that he has lower extremity paresis but has some movement in BL UE.    Chronic indwelling Foley catheter Pt has foly catheter in place that is changed every month by a nurse. Catheter changed during this admission. Repeat UA was obtained after catheter change    -New catheter in place      Depression -Continue home lexapro  10 mg  Best Practice: Diet: Normal VTE: Enoxaparin  IVF: None,None Code: DNR   Disposition planning: Prior to Admission Living Arrangement: Home  Anticipated Discharge Location: Home   Dispo: Admit patient to Observation with expected length of stay less than >=2 midnights.  Signature:  Rebecka Edgardo [facility]  10:06 AM, 06/27/2024  On Call pager [phone]

## 2024-06-27 NOTE — Plan of Care (Signed)

## 2024-06-28 ENCOUNTER — Inpatient Hospital Stay (HOSPITAL_COMMUNITY)

## 2024-06-28 DIAGNOSIS — K5641 Fecal impaction: Secondary | ICD-10-CM | POA: Diagnosis not present

## 2024-06-28 DIAGNOSIS — G500 Trigeminal neuralgia: Secondary | ICD-10-CM | POA: Diagnosis not present

## 2024-06-28 DIAGNOSIS — J9601 Acute respiratory failure with hypoxia: Secondary | ICD-10-CM | POA: Diagnosis not present

## 2024-06-28 DIAGNOSIS — G35 Multiple sclerosis: Secondary | ICD-10-CM | POA: Diagnosis not present

## 2024-06-28 LAB — CBC WITH DIFFERENTIAL/PLATELET
Abs Immature Granulocytes: 0.02 K/uL (ref 0.00–0.07)
Basophils Absolute: 0.1 K/uL (ref 0.0–0.1)
Basophils Relative: 1 %
Eosinophils Absolute: 0.4 K/uL (ref 0.0–0.5)
Eosinophils Relative: 5 %
HCT: 43.6 % (ref 39.0–52.0)
Hemoglobin: 14.1 g/dL (ref 13.0–17.0)
Immature Granulocytes: 0 %
Lymphocytes Relative: 10 %
Lymphs Abs: 0.7 K/uL (ref 0.7–4.0)
MCH: 29.8 pg (ref 26.0–34.0)
MCHC: 32.3 g/dL (ref 30.0–36.0)
MCV: 92.2 fL (ref 80.0–100.0)
Monocytes Absolute: 0.8 K/uL (ref 0.1–1.0)
Monocytes Relative: 13 %
Neutro Abs: 4.7 K/uL (ref 1.7–7.7)
Neutrophils Relative %: 71 %
Platelets: 210 K/uL (ref 150–400)
RBC: 4.73 MIL/uL (ref 4.22–5.81)
RDW: 15.8 % — ABNORMAL HIGH (ref 11.5–15.5)
WBC: 6.6 K/uL (ref 4.0–10.5)
nRBC: 0 % (ref 0.0–0.2)

## 2024-06-28 LAB — BASIC METABOLIC PANEL WITH GFR
Anion gap: 14 (ref 5–15)
BUN: 20 mg/dL (ref 8–23)
CO2: 26 mmol/L (ref 22–32)
Calcium: 9 mg/dL (ref 8.9–10.3)
Chloride: 104 mmol/L (ref 98–111)
Creatinine, Ser: 0.79 mg/dL (ref 0.61–1.24)
GFR, Estimated: >60 mL/min (ref >60)
Glucose, Bld: 107 mg/dL — ABNORMAL HIGH (ref 70–99)
Potassium: 3.6 mmol/L (ref 3.5–5.1)
Sodium: 144 mmol/L (ref 135–145)

## 2024-06-28 MED ORDER — LACTATED RINGERS IV SOLN: INTRAVENOUS | Status: DC

## 2024-06-28 MED ORDER — AZITHROMYCIN 500 MG PO TABS
250.0000 mg | ORAL_TABLET | Freq: Every day | ORAL | Status: DC
Start: 2024-06-28 — End: 2024-06-30
  Administered 2024-06-28 – 2024-06-30 (×3): 250 mg via ORAL
  Filled 2024-06-28 (×3): qty 1

## 2024-06-28 MED ORDER — SORBITOL 70 % SOLN
45.0000 mL | Freq: Once | Status: AC
  Administered 2024-06-28: 45 mL via ORAL
  Filled 2024-06-28: qty 60

## 2024-06-28 MED ORDER — LACTATED RINGERS IV SOLN
INTRAVENOUS | Status: DC
Start: 2024-06-28 — End: 2024-06-28

## 2024-06-28 MED ORDER — AMOXICILLIN-POT CLAVULANATE 875-125 MG PO TABS
1.0000 | ORAL_TABLET | Freq: Two times a day (BID) | ORAL | Status: DC
  Administered 2024-06-28 – 2024-06-30 (×5): 1 via ORAL
  Filled 2024-06-28 (×5): qty 1

## 2024-06-28 NOTE — Progress Notes (Signed)
 HD#2 SUBJECTIVE:  Patient Summary: [name] is a 63 y.o. person living with a history of MS, [place] who presented with shocks on left side of his head and admitted for acute hypoxic respiratory failure  Overnight Events: None  Interim History:  Saw pt at bedside this AM. He denied any fevers, chills, SOB. Pt did not have any episodes of shocks on the left side of his head. Wife was present at bedside and said he had BM yesterday that were clear.   OBJECTIVE:  Vital Signs: Vitals:   06/27/24 2026 06/28/24 0515 06/28/24 0709 06/28/24 0819  BP: 102/86 119/76  109/75  Pulse: 81 70  66  Resp: 20   16  Temp: 99.3 F (37.4 C) 97.7 F (36.5 C)  98.4 F (36.9 C)  TempSrc: Axillary     SpO2: 92% 97%  97%  Weight:   87 kg   Height:       Supplemental O2: Room Air SpO2: 97 % O2 Flow Rate (L/min): 3 L/min  Filed Weights   06/26/24 0500 06/27/24 0500 06/28/24 0709  Weight: 86.6 kg 86.4 kg 87 kg     Intake/Output Summary (Last 24 hours) at 06/28/2024 1315 Last data filed at 06/28/2024 0900 Gross per 24 hour  Intake --  Output 900 ml  Net -900 ml   Net IO Since Admission: -3,251.22 mL [06/28/24 1315]  Physical Exam: Physical Exam HENT:     Head: Normocephalic.  Cardiovascular:     Rate and Rhythm: Normal rate and regular rhythm.  Pulmonary:     Effort: Pulmonary effort is normal.     Breath sounds: Normal breath sounds.  Abdominal:     Comments: Abdomen was distended but mildly soft in upper quadrants  Neurological:     Mental Status: He is alert.     Patient Lines/Drains/Airways Status     Active Line/Drains/Airways     Name Placement date Placement time Site Days   Peripheral IV 06/25/24 20 G Anterior;Left Forearm 06/25/24  0915  Forearm  3   Urethral Catheter Augustin Carls, RN Latex 16 Fr. 06/25/24  1755  Latex  3   Wound 06/25/24 1719 Pressure Injury Buttocks Left Stage 4 - Full thickness tissue loss with exposed bone, tendon or muscle. 06/25/24  1719   Buttocks  3   Wound 06/25/24 1721 Pressure Injury Buttocks Right Stage 4 - Full thickness tissue loss with exposed bone, tendon or muscle. 06/25/24  1721  Buttocks  3            Pertinent labs and imaging:      Latest Ref Rng & Units 06/28/2024    4:37 AM 06/27/2024    5:37 AM 06/26/2024    4:15 AM  CBC  WBC 4.0 - 10.5 K/uL 6.6  8.0  8.4   Hemoglobin 13.0 - 17.0 g/dL 85.8  86.1  85.9   Hematocrit 39.0 - 52.0 % 43.6  42.8  43.6   Platelets 150 - 400 K/uL 210  206  181        Latest Ref Rng & Units 06/28/2024    4:37 AM 06/27/2024    5:37 AM 06/26/2024    4:15 AM  CMP  Glucose 70 - 99 mg/dL 892  75  70   BUN 8 - 23 mg/dL 20  20  14    Creatinine 0.61 - 1.24 mg/dL 9.20  9.13  9.12   Sodium 135 - 145 mmol/L 144  145  142  Potassium 3.5 - 5.1 mmol/L 3.6  3.9  3.4   Chloride 98 - 111 mmol/L 104  105  101   CO2 22 - 32 mmol/L 26  25  29    Calcium 8.9 - 10.3 mg/dL 9.0  8.9  8.8     No results found.  ASSESSMENT/PLAN:  Assessment: Principal Problem:   Acute hypoxic respiratory failure (HCC) Active Problems:   Multiple sclerosis, secondary progressive (HCC)   Neurogenic bladder   Spastic quadriparesis (HCC)   Depression   Trigeminal neuralgia   Sacral wound   Chronic indwelling Foley catheter   Pneumonia due to infectious organism   Plan: [name] is a 63 y.o. person living with a history of MS, [place] who presented with shocks on left side of his head and admitted for acute hypoxic respiratory failure    Acute hypoxic respiratory failure (HCC): Pt was feeling well with no SOB, fevers, chills. He said he was feeling well but only a little dehydrated. Wife also mentioned that his urine was darker than usual. Wife said pt has good fluid intake at home, however, isn't having enough liquids during admission. On checking, urine in his bag was mildly dark compared to yesterday--will give him a small bolus of IV fluids. Pt was satting well on RA since morning. Will transition  to PO abx. Urine cx from initial urine sample was positive for P. AERUGINOSA . His previous cx have also been positive for pseudo in the past. Pt endorses no urinary symptoms and wife also mentioned that he does not appear like he usually does during a UTI--he is doing well now. Urine cx likely a reflection of him being colonized by P. Aeruginosa.    -On IV rocephin  and azithromycin  stopped -Started Augmentin  875 mg BID, Azithromycin  250 mg once: day 4 of IP abx -received LR - flutter valve for pulmonary hygiene   Large Stool Burden Pt had clear BM yesterday. Pt's abdomen was still distended after receiving two doses of sorbitol  and one dose of dulcolax. However, there was mild improvement as his abdomen appeared a little soft in the upper quadrants. Will continue with more sorbitol .    -ordered a dose of Sorbitol  45 mL -repeated KUB -Continue daily Miralax , senna   Trigeminal neuralgia Pt has trigeminal neuralgia with pains that occur on his right jaw. His current shocks on the left side of the head are likely due to his [place]. Currently, pt has had no episodes of shocks on the left side of his head. Will continue home medications.    -Continue home lamotrigine  100mg   -Continue percocet PRN  -Lyrica  150 mg      Chronic Conditions:    Multiple sclerosis, secondary progressive (HCC) Pt has a hx of MS but wife said his flares are hard to describe. They don't cause him any active pain and his episode with shocks on the left side of the head was not similar to his MS flares.  - Receives Ocrelizumab every 6 months; Next dose is July 31 - No acute concern for flare at this time    Bilateral Sacral wound (Present on admission) Pt has sacral wounds in his bilateral gluteal region.  Pt sees Dr. Rosan for wound care OP.  Will reach out for further consultation with WC, if required.  -Triamcinolone  and ketoconazole  cream in skin around buttocks   Spastic quadriparesis (HCC) Pt is bedbound and  completely dependant for ADLs and iADLs. Wife mentioned that he has lower extremity paresis  but has some movement in BL UE.    Chronic indwelling Foley catheter Pt has foly catheter in place that is changed every month by a nurse. Catheter changed during this admission. Repeat UA was obtained after catheter change   -New catheter in place      Depression -Continue home lexapro  10 mg   Best Practice: Diet: Normal VTE: Enoxaparin  IVF: None,None Code: DNR   Disposition planning: Prior to Admission Living Arrangement: Home  Anticipated Discharge Location: Home   Dispo: Admit patient to Observation with expected length of stay less than <2 midnights.  Signature:  Rebecka Edgardo [facility]  1:15 PM, 06/28/2024  On Call pager [phone]

## 2024-06-28 NOTE — Progress Notes (Signed)
    Durable Medical Equipment  (From admission, onward)           Start     Ordered   06/28/24 1230  For home use only DME oxygen  Once       Question Answer Comment  Length of Need 6 Months   Mode or (Route) Nasal cannula   Liters per Minute 1   Frequency Only at night (stationary unit needed)   Oxygen delivery system Gas      06/28/24 1229             Dekker, Caitlin E, RN  Registered Nurse   Progress Notes    Signed   Date of Service: 06/28/2024 11:59 AM   Signed      SATURATION QUALIFICATIONS: (This note is used to comply with regulatory documentation for home oxygen)   Patient Saturations on Room Air at Rest = 86%   Patient Saturations on Room Air while Ambulating = %   Patient Saturations on  Liters of oxygen while Ambulating = %   Please briefly explain why patient needs home oxygen: Pt desats while sleeping down to 86 percent, pt cannot ambulate he is bedbound

## 2024-06-28 NOTE — Progress Notes (Addendum)
 Transition of Care [facility]) - Inpatient Brief Assessment   Patient Details  Name: [name] MRN: [id number] Date of Birth: [birth year]  [facility] CM/SW Contact:    Rosaline JONELLE Joe, RN Phone Number: 06/28/2024, 11:27 AM   Clinical Narrative: Patient admitted to the hospital with simple pneumonia.  Patient remains on RA at this time.   Patient's wife provides 24 hour care in the home.  DME at the home includes hospital bed, ramp, ceiling lift, foley at home.  Patient is active with [facility] for RN.  HH RN order placed to be co-signed by MD.  Patient's wife states that patient will return home by personal [facility] van when stable to discharge.  06/28/24 1641 - [facility] and [facility] to [facility] the normal walk test documentation and are requesting overnight pulse oximetry reading ordered and resent to the [place] tomorrow.  MD team was updated and requested to order.  Rocky, RN Charge was notified and asked to follow up.   Transition of Care Asessment: Insurance and Status: (P) Insurance coverage has been reviewed Patient has primary care physician: (P) Yes Home environment has been reviewed: (P) from home with spouse Prior level of function:: (P) care provided with wife - WC bound Prior/Current Home Services: (P) Current home services (Active with [facility] for RN) Social Drivers of Health Review: (P) SDOH reviewed no interventions necessary Readmission risk has been reviewed: (P) Yes Transition of care needs: (P) no transition of care needs at this time

## 2024-06-28 NOTE — Progress Notes (Signed)
 SATURATION QUALIFICATIONS: (This note is used to comply with regulatory documentation for home oxygen)  Patient Saturations on Room Air at Rest = 86%  Patient Saturations on Room Air while Ambulating = %  Patient Saturations on  Liters of oxygen while Ambulating = %  Please briefly explain why patient needs home oxygen: Pt desats while sleeping down to 86 percent, pt cannot ambulate he is bedbound

## 2024-06-28 NOTE — Plan of Care (Signed)

## 2024-06-28 NOTE — Plan of Care (Addendum)
 Continuous pulse ox monitor in place. Pt noted to have oxygen desaturations to 88% when supplemental O2 flow <3L. Able to maintain saturations, while asleep and awake, when supplemental O2 flow > or = 3L. Unable to measure oxygen saturation while ambulating due to extremity paresis in upper and lower extremities.   Medication taken whole with applesauce, no signs or symptoms of dysphagia noted. Touch sensitive call bell in place behind patient's right ear- able to easily move head to right and trigger call bell. Q2 turns maintained. Nectar thick fluids offered at least Q2 and anytime staff is at bedside. Pt refused PO fluid at time of vital signs and medication administration, but did take in one whole applesauce. Dressings to buttocks performed on day shift: both C/D/I. Foley and peri care performed as well as CHG bath. Fall precautions in place, bed in low locked position, bed alarm active and audible, nonskid socks in place.   Problem: Education: Goal: Knowledge of General Education information will improve Description: Including pain rating scale, medication(s)/side effects and non-pharmacologic comfort measures Outcome: Progressing   Problem: Health Behavior/Discharge Planning: Goal: Ability to manage health-related needs will improve Outcome: Progressing   Problem: Clinical Measurements: Goal: Ability to maintain clinical measurements within normal limits will improve Outcome: Progressing Goal: Will remain free from infection Outcome: Progressing Goal: Diagnostic test results will improve Outcome: Progressing Goal: Respiratory complications will improve Outcome: Progressing Goal: Cardiovascular complication will be avoided Outcome: Progressing   Problem: Activity: Goal: Risk for activity intolerance will decrease Outcome: Progressing   Problem: Nutrition: Goal: Adequate nutrition will be maintained Outcome: Progressing   Problem: Coping: Goal: Level of anxiety will  decrease Outcome: Progressing   Problem: Elimination: Goal: Will not experience complications related to bowel motility Outcome: Progressing Goal: Will not experience complications related to urinary retention Outcome: Progressing   Problem: Pain Managment: Goal: General experience of comfort will improve and/or be controlled Outcome: Progressing   Problem: Safety: Goal: Ability to remain free from injury will improve Outcome: Progressing   Problem: Skin Integrity: Goal: Risk for impaired skin integrity will decrease Outcome: Progressing

## 2024-06-29 ENCOUNTER — Other Ambulatory Visit (HOSPITAL_COMMUNITY): Payer: Self-pay

## 2024-06-29 DIAGNOSIS — K5641 Fecal impaction: Secondary | ICD-10-CM | POA: Diagnosis not present

## 2024-06-29 DIAGNOSIS — J9601 Acute respiratory failure with hypoxia: Secondary | ICD-10-CM | POA: Diagnosis not present

## 2024-06-29 DIAGNOSIS — G35 Multiple sclerosis: Secondary | ICD-10-CM | POA: Diagnosis not present

## 2024-06-29 DIAGNOSIS — G500 Trigeminal neuralgia: Secondary | ICD-10-CM | POA: Diagnosis not present

## 2024-06-29 LAB — CBC WITH DIFFERENTIAL/PLATELET
Abs Immature Granulocytes: 0.03 K/uL (ref 0.00–0.07)
Basophils Absolute: 0.1 K/uL (ref 0.0–0.1)
Basophils Relative: 1 %
Eosinophils Absolute: 0.5 K/uL (ref 0.0–0.5)
Eosinophils Relative: 7 %
HCT: 42.8 % (ref 39.0–52.0)
Hemoglobin: 13.6 g/dL (ref 13.0–17.0)
Immature Granulocytes: 0 %
Lymphocytes Relative: 9 %
Lymphs Abs: 0.6 K/uL — ABNORMAL LOW (ref 0.7–4.0)
MCH: 29.3 pg (ref 26.0–34.0)
MCHC: 31.8 g/dL (ref 30.0–36.0)
MCV: 92.2 fL (ref 80.0–100.0)
Monocytes Absolute: 0.9 K/uL (ref 0.1–1.0)
Monocytes Relative: 12 %
Neutro Abs: 4.9 K/uL (ref 1.7–7.7)
Neutrophils Relative %: 71 %
Platelets: 212 K/uL (ref 150–400)
RBC: 4.64 MIL/uL (ref 4.22–5.81)
RDW: 15.6 % — ABNORMAL HIGH (ref 11.5–15.5)
WBC: 7 K/uL (ref 4.0–10.5)
nRBC: 0 % (ref 0.0–0.2)

## 2024-06-29 LAB — BASIC METABOLIC PANEL WITH GFR
Anion gap: 12 (ref 5–15)
BUN: 19 mg/dL (ref 8–23)
CO2: 27 mmol/L (ref 22–32)
Calcium: 9 mg/dL (ref 8.9–10.3)
Chloride: 106 mmol/L (ref 98–111)
Creatinine, Ser: 0.64 mg/dL (ref 0.61–1.24)
GFR, Estimated: >60 mL/min (ref >60)
Glucose, Bld: 99 mg/dL (ref 70–99)
Potassium: 3.9 mmol/L (ref 3.5–5.1)
Sodium: 145 mmol/L (ref 135–145)

## 2024-06-29 MED ORDER — AZITHROMYCIN 250 MG PO TABS
250.0000 mg | ORAL_TABLET | Freq: Every day | ORAL | 0 refills | Status: DC
  Filled 2024-06-29: qty 2, 2d supply, fill #0

## 2024-06-29 MED ORDER — SENNOSIDES-DOCUSATE SODIUM 8.6-50 MG PO TABS
1.0000 | ORAL_TABLET | Freq: Every evening | ORAL | 0 refills | Status: AC | PRN
  Filled 2024-06-29: qty 60, 60d supply, fill #0

## 2024-06-29 MED ORDER — AMOXICILLIN-POT CLAVULANATE 875-125 MG PO TABS
1.0000 | ORAL_TABLET | Freq: Two times a day (BID) | ORAL | 0 refills | Status: DC
  Filled 2024-06-29: qty 5, 3d supply, fill #0

## 2024-06-29 MED ORDER — LINACLOTIDE 145 MCG PO CAPS
145.0000 ug | ORAL_CAPSULE | Freq: Every day | ORAL | 0 refills | Status: AC
  Filled 2024-06-29: qty 30, 30d supply, fill #0

## 2024-06-29 MED ORDER — ORAL CARE MOUTH RINSE: 15.0000 mL | OROMUCOSAL | Status: DC | PRN

## 2024-06-29 MED ORDER — DULCOLAX 5 MG PO TBEC
10.0000 mg | DELAYED_RELEASE_TABLET | Freq: Every day | ORAL | 0 refills | Status: DC | PRN
  Filled 2024-06-29: qty 30, 15d supply, fill #0

## 2024-06-29 MED ORDER — POLYETHYLENE GLYCOL 3350 17 GM/SCOOP PO POWD
17.0000 g | Freq: Every day | ORAL | 0 refills | Status: DC | PRN
  Filled 2024-06-29: qty 238, 14d supply, fill #0

## 2024-06-29 NOTE — Plan of Care (Signed)

## 2024-06-29 NOTE — Progress Notes (Signed)
 Results downloaded, study generated, printed and placed in pts chart on unit, two copies.  ------------------- Ordered overnight sp02 study started at this time, pt found on 1.5 lpm [place] with sp02 noted at 88% while dozing/sleeping.

## 2024-06-29 NOTE — Progress Notes (Signed)
 Continuous pulse ox monitor in place. Pt noted to have oxygen desaturations to 88% when supplemental O2 flow <3L. Able to maintain saturations, while asleep and awake, when supplemental O2 flow > or = 3L. Unable to measure oxygen saturation while ambulating due to extremity paresis in upper and lower extremities.   01:00 Supplemental O2 decreased to 2L , with saturations maintained at 94% while asleep.   Pt has been consistently below 88% O2 sats when trialled on RA. He saturating anywhere between 90-91 % on 2 L.    Rebecka Pion, DO 06/29/2024

## 2024-06-29 NOTE — Progress Notes (Signed)
 HD#3 SUBJECTIVE:  Patient Summary: [name] is a 63 y.o. person living with a history of MS, [place] who presented with shocks on left side of his head and admitted for acute hypoxic respiratory failure.   Overnight Events: none  Interim History:  Saw pt at bedside this AM. Pt was feeling well. Denied any SOB, fevers, chills. Did not have a BM yesterday. Said he would like to go home on laxatives.   OBJECTIVE:  Vital Signs: Vitals:   06/29/24 0049 06/29/24 0050 06/29/24 0402 06/29/24 0418  BP:  108/76 106/70   Pulse: (!) 57  60   Resp: 16  16   Temp:  98.1 F (36.7 C) 97.8 F (36.6 C)   TempSrc: Oral  Oral   SpO2: 96% 94% 92%   Weight:    86.9 kg  Height:       Supplemental O2: Nasal Cannula SpO2: 92 % O2 Flow Rate (L/min): 2 L/min  Filed Weights   06/27/24 0500 06/28/24 0709 06/29/24 0418  Weight: 86.4 kg 87 kg 86.9 kg     Intake/Output Summary (Last 24 hours) at 06/29/2024 1331 Last data filed at 06/29/2024 1100 Gross per 24 hour  Intake 120 ml  Output 550 ml  Net -430 ml   Net IO Since Admission: -2,681.22 mL [06/29/24 1331]  Physical Exam: Physical Exam Cardiovascular:     Rate and Rhythm: Normal rate and regular rhythm.     Heart sounds: Normal heart sounds.  Pulmonary:     Effort: Pulmonary effort is normal.     Breath sounds: Normal breath sounds.  Abdominal:     Comments: Distension with upper quadrants feeling mildly soft  Neurological:     Mental Status: He is alert.     Patient Lines/Drains/Airways Status     Active Line/Drains/Airways     Name Placement date Placement time Site Days   Urethral Catheter Augustin Carls, RN Latex 16 Fr. 06/25/24  1755  Latex  4   Wound 06/25/24 1719 Pressure Injury Buttocks Left Stage 4 - Full thickness tissue loss with exposed bone, tendon or muscle. 06/25/24  1719  Buttocks  4   Wound 06/25/24 1721 Pressure Injury Buttocks Right Stage 4 - Full thickness tissue loss with exposed bone, tendon or muscle.  06/25/24  1721  Buttocks  4            Pertinent labs and imaging:      Latest Ref Rng & Units 06/29/2024    1:55 AM 06/28/2024    4:37 AM 06/27/2024    5:37 AM  CBC  WBC 4.0 - 10.5 K/uL 7.0  6.6  8.0   Hemoglobin 13.0 - 17.0 g/dL 86.3  85.8  86.1   Hematocrit 39.0 - 52.0 % 42.8  43.6  42.8   Platelets 150 - 400 K/uL 212  210  206        Latest Ref Rng & Units 06/29/2024    1:55 AM 06/28/2024    4:37 AM 06/27/2024    5:37 AM  CMP  Glucose 70 - 99 mg/dL 99  892  75   BUN 8 - 23 mg/dL 19  20  20    Creatinine 0.61 - 1.24 mg/dL 9.35  9.20  9.13   Sodium 135 - 145 mmol/L 145  144  145   Potassium 3.5 - 5.1 mmol/L 3.9  3.6  3.9   Chloride 98 - 111 mmol/L 106  104  105   CO2 22 -  32 mmol/L 27  26  25    Calcium 8.9 - 10.3 mg/dL 9.0  9.0  8.9     DG Abd 1 View Result Date: 06/28/2024 CLINICAL DATA:  Abdominal mass. EXAM: ABDOMEN - 1 VIEW COMPARISON:  Abdominal radiograph dated 06/27/2024. FINDINGS: Large amount of stool throughout the colon. There is progressive air distension of the sigmoid colon. No free air. No acute osseous pathology. IMPRESSION: Large amount of stool throughout the colon with progressive air distension of the sigmoid colon. Electronically Signed   By: Vanetta Chou M.D.   On: 06/28/2024 18:04    ASSESSMENT/PLAN:  Assessment: Principal Problem:   Acute hypoxic respiratory failure (HCC) Active Problems:   Multiple sclerosis, secondary progressive (HCC)   Neurogenic bladder   Spastic quadriparesis (HCC)   Depression   Trigeminal neuralgia   Sacral wound   Chronic indwelling Foley catheter   Pneumonia due to infectious organism   Plan: [name] is a 63 y.o. person living with a history of MS, [place] who presented with shocks on left side of his head and admitted for acute hypoxic respiratory failure    Acute hypoxic respiratory failure (HCC): Pt was feeling well. Denied any SOB, fevers, chills. Discharge pending overnight pulse ox documentation    - d/c pending overnight pulse ox -Continue PO Augmentin  and azithromycin : abx Day 5/7   - flutter valve for pulmonary hygiene   Large Stool Burden No BM yesterday. Will continue with miralax , senna.    Trigeminal neuralgia Pt has trigeminal neuralgia with pains that occur on his right jaw. His current shocks on the left side of the head are likely due to his [place]. Currently, pt has had no episodes of shocks on the left side of his head. Will continue home medications.    -Continue home lamotrigine  100mg   -Continue percocet PRN  -Lyrica  150 mg      Chronic Conditions:    Multiple sclerosis, secondary progressive (HCC) Pt has a hx of MS but wife said his flares are hard to describe. They don't cause him any active pain and his episode with shocks on the left side of the head was not similar to his MS flares.  - Receives Ocrelizumab every 6 months; Next dose is July 31 - No acute concern for flare at this time    Bilateral Sacral wound (Present on admission) Pt has sacral wounds in his bilateral gluteal region.  Pt sees Dr. Rosan for wound care OP.  Will reach out for further consultation with WC, if required.  -Triamcinolone  and ketoconazole  cream in skin around buttocks   Spastic quadriparesis (HCC) Pt is bedbound and completely dependant for ADLs and iADLs. Wife mentioned that he has lower extremity paresis but has some movement in BL UE.    Chronic indwelling Foley catheter Pt has foly catheter in place that is changed every month by a nurse. Catheter changed during this admission. Repeat UA was obtained after catheter change    -New catheter in place   Best Practice: Diet: Normal VTE: Enoxaparin  IVF: None,None Code: DNR   Disposition planning: Prior to Admission Living Arrangement: Home  Anticipated Discharge Location: Home   Dispo: Admit patient to Observation with expected length of stay less than < 1 midnight.    Signature:  Rebecka Edgardo [facility]  1:31 PM, 06/29/2024  On Call pager [phone]

## 2024-06-29 NOTE — Discharge Instructions (Addendum)
 To [name] or their caretakers,  You were recently admitted to [facility] for community acquired pneumonia and shocks on the left side of the head.   Continue taking your home medications with the following changes:  Start taking Augmentin  875-125 mg : 1 tablet tonight followed by 2 tablets tomorrow one in AM and one in PM(for Pneumonia) Azithromycin  250 mg : 1 tablet tomorrow (for Pnuemonia) Linzess  145 mcg: as needed for constipation Dulcolax 5mg : take two tablets as needed for constipation Miralax  17g: as needed for constipation Senna-docusate: as needed for constipation Continue taking All other home medications  3. You will be receiving supplemental Oxygen. Please use it to help with breathing.    You should seek further medical care if your symptoms worsen.  Please follow up with the following doctors/specialties: Primary Care Physician within 1 week of discharge   We recommend that you also see your primary care doctor in about a week to make sure that you continue to improve. We are so glad that you are feeling better.  Sincerely,  [facility]

## 2024-06-29 NOTE — Discharge Summary (Deleted)
 Name: [name] MRN: [id number] DOB: 08/14/1961 63 y.o. PCP: [facility]  Date of Admission: 06/25/2024  7:00 AM Date of Discharge: 06/29/2024 Attending Physician: Dr. MICAEL Riis Winfrey  Discharge Diagnosis: 1. Principal Problem:   Acute hypoxic respiratory failure (HCC) Active Problems:   Multiple sclerosis, secondary progressive (HCC)   Neurogenic bladder   Spastic quadriparesis (HCC)   Depression   Trigeminal neuralgia   Sacral wound   Chronic indwelling Foley catheter   Pneumonia due to infectious organism   Discharge Medications: Allergies as of 06/29/2024       Reactions   Gadopiclenol  Nausea And Vomiting   Iodinated Contrast Media Nausea And Vomiting        Medication List     TAKE these medications    acetaminophen  500 MG tablet Commonly known as: TYLENOL  Take 1,000 mg by mouth as needed for mild pain (pain score 1-3) or moderate pain (pain score 4-6).   albuterol  108 (90 Base) MCG/ACT inhaler Commonly known as: VENTOLIN  HFA TAKE 2 PUFFS BY MOUTH EVERY 6 HOURS AS NEEDED What changed: See the new instructions.   amoxicillin -clavulanate 875-125 MG tablet Commonly known as: AUGMENTIN  Take 1 tablet by mouth 2 (two) times daily.   ascorbic acid  500 MG tablet Commonly known as: VITAMIN C  Take 500 mg by mouth 2 (two) times daily.   azithromycin  250 MG tablet Commonly known as: ZITHROMAX  Take 1 tablet (250 mg total) by mouth daily. Start taking on: June 30, 2024   baclofen  10 MG tablet Commonly known as: LIORESAL  Take 10 mg by mouth daily as needed for muscle spasms.   CALCIUM 600 PO Take 1 capsule by mouth daily.   cholecalciferol  25 MCG (1000 UNIT) tablet Commonly known as: VITAMIN D3 Take 1,000 Units by mouth at bedtime.   cyanocobalamin  500 MCG tablet Commonly known as: VITAMIN B12 Take 500 mcg by mouth daily.   Dulcolax 5 MG EC tablet Generic drug: bisacodyl  Take 2 tablets (10 mg total) by mouth daily as needed  for moderate constipation.   escitalopram  10 MG tablet Commonly known as: LEXAPRO  Take 1 tablet (10 mg total) by mouth daily.   ibuprofen  200 MG tablet Commonly known as: ADVIL  Take 600 mg by mouth as needed for headache or moderate pain.   ketoconazole  2 % shampoo Commonly known as: NIZORAL  Apply 1 Application topically See admin instructions. SHAMPOO AS DIRECTED AFFECTED AREA THREE TIMES A WEEK TO THE SCALP IN THE SHOWER LEAVE ON FOR 5 MINUTES THEN RINSE OFF. TO THE SCALP IN THE SHOWER LEAVE ON FOR 5 MINUTES THEN RINSE OFF. Every 4 days   lamoTRIgine  100 MG tablet Commonly known as: LAMICTAL  Take 100 mg by mouth in the morning, 100 mg at lunch and 200 mg by mouth at bedtime. What changed:  how much to take how to take this when to take this additional instructions   linaclotide  145 MCG Caps capsule Commonly known as: LINZESS  Take 1 capsule (145 mcg total) by mouth daily before breakfast.   methenamine 1 g tablet Commonly known as: MANDELAMINE Take 1,000 mg by mouth 2 (two) times daily. Only takes this medication when not on an antibiotic.   modafinil  100 MG tablet Commonly known as: PROVIGIL  Take 1 tablet by mouth every morning.   nystatin cream Commonly known as: MYCOSTATIN Apply 1 Application topically daily as needed for dry skin.   ocrelizumab 600 mg in sodium chloride  0.9 % 500 mL Inject 600 mg into the vein every 6 (  six) months.   oxyCODONE -acetaminophen  5-325 MG tablet Commonly known as: Percocet Take 1 tablet by mouth every 12 (twelve) hours as needed for severe pain (pain score 7-10).   polyethylene glycol powder 17 GM/SCOOP powder Commonly known as: GLYCOLAX /MIRALAX  Take 17 g by mouth daily as needed. Dissolve 1 capful (17g) in 4-8 ounces of liquid and take by mouth daily.   pregabalin  150 MG capsule Commonly known as: LYRICA  Take 1 capsule (150 mg total) by mouth 3 (three) times daily.   senna-docusate 8.6-50 MG tablet Commonly known as:  Senokot-S Take 1 tablet by mouth at bedtime as needed for mild constipation.   THERATEARS OP Place 1 drop into both eyes 4 (four) times daily as needed (dry eye).               Durable Medical Equipment  (From admission, onward)           Start     Ordered   06/29/24 1103  DME Oxygen  Once       Question Answer Comment  Length of Need 6 Months   Oxygen delivery system Gas      06/29/24 1106   06/28/24 1230  For home use only DME oxygen  Once       Question Answer Comment  Length of Need 6 Months   Mode or (Route) Nasal cannula   Liters per Minute 1   Frequency Only at night (stationary unit needed)   Oxygen delivery system Gas      06/28/24 1229              Discharge Care Instructions  (From admission, onward)           Start     Ordered   06/29/24 0000  Discharge wound care:       Comments: 1. Cleanse L glute wound with Vashe wound cleanser, do not rinse and allow to air dry. Using a Q tip applicator apply Dakins moistened gauze to wound bed daily making sure to cover area of tunneling at 12 o'clock and any area of undermining.  Cover with dry gauze and ABD pad/tape or silicone foam whichever is preferred.  2.Cleanse R glute wound with Vashe, do not rinse and allow to air dry. Using a Q tip applicator insert silver hydrofiber (Aquacel ANDRIA Collum [id number]) into wound bed daily and cover with silicone foam or ABD pad whichever is preferred.   Apply Ketoconazole  and Triamcinolone  creams to skin surrounding both wounds at time of dressing change   06/29/24 1106            Disposition and follow-up:   [name] was discharged from [facility] in Good condition.  At the hospital follow up visit please address:  1.  SOB, Worsening of shocks on the left side of the head 2.  Ask about BM as his imaging and abdominal distension throughout admission were concerning for constipation.  3. Home O2 supply can be discontinued if pt is  satting well on RA and there is no further indication for it.   2.  Labs / imaging needed at time of follow-up: KUB for constipation  3.  Pending labs/ test needing follow-up: none   Follow-up Appointments:  Follow-up Information     [facility] [address], [place] Follow up.   Why: [facility] will continue to provide [facility] RN at the home. Contact information: [address] [place] [address] [phone]  [facility] Schedule an appointment as soon as possible for a visit in 1 week(s).   Contact information: [address] [facility] [place] [address] [phone]                  Hospital Course by problem list: [name] is a 63 y.o. person living with a history of MS, TN who presented with shocks on left side of his head and admitted for acute hypoxic respiratory failure.    Principal Problem: Acute hypoxic respiratory failure (HCC): On admission, the pt said it was his SOB and shocks on the left side of the head that bothered him the most. His SOB had started recently. He was given a dose of IV rocephin  and azithromycin  in the ED as his CXR showed concerns of PNA with bibasilar opacities and bilateral pleural effusions. On PE, pt appeared volume overloaded with swelling in BL LE and crackles in posterior lung fields. Pt also required supplemental O2 as hos O2 sats were dropping to 88% on RA with minimal exertion. Pt denied any fevers, new cough with sputum production. However, he had elevated WBC count. Initially we suspected HF to be the cause of his symptoms, however, his BNP levels came back normal and his echo showed an EF of 55-60% with normal systolic and diastolic function of LV and RV. Procal levels indicated restarting pt back on abx. Pt received 3 days of IV rocephin  and azithromycin . His symptoms improved with each day and leukocytosis resolved, however, his O2 requirements went up to 3L as  his O2 saturation levels were dropping to 88% on 2L. On discharge evaluation, pt denied any complaints of SOB. He denied any fevers, chill, or acute complaints. We transitioned him to PO Augmentin  and azithromycin . He was stable to be discharged. Pt to take a 2 day course of Augmentin  875-125 mg BID and Azithromycin  250 mg once daily upon discharge to complete 7-day course of abx. Pt also discharged with a supply of supplemental O2.   Trigeminal neuralgia Pt came in primarily for shock like pain on the left side of the head that had started the night before his admission. He was accompanied by his wife who did most of the talking as pt was very weak. Wife mentioned that pt has trigeminal neuralgia with pains that occur on his right jaw. Wife said that pt has never had TN on the left side of his face. Due to his hx of TN, his recent sharp left-sided shocks were likely due to a TN flare on a different location of his face. Per wife, pt took baclofen , lamotrigine , lyrica  at home. We continued treating him with home lamotrigine  100mg , percocet PRN, baclofen  10 mg, Lyrica  150 mg. He only had one more episode of shocks on the left side of his head during the beginning of his admission which subsided later. Pt did not complain about shocks on the left side of the head on d/c evaluation.   Multiple sclerosis, secondary progressive (HCC) Pt has a hx of MS but wife said his flares are hard to describe. They don't cause him any active pain and his shocks on the head were not similar to his MS flares. Pt receives Ocrelizumab 600 mg every 6 months and his next dose is July 31. We were not concerned for an acute flare that was causing his symptoms.  Large Stool Burden CXR on admission showed large stool burden in the visualized colon of upper abdomen. Wife mentioned pt  had no changes in BM--normally has 2 BM per day and receives enemas when he is not able to which helps him. Explained to wife that enemas don't have much  effect on upper abdomen. Pt started on senna, increased dose of miralax , and Mg citrate to help with complete BM. He also received sorbitol , dulcolax to help with his constipation. Pt's abdomen was distended when he came in and appeared a bit soft on discharge day evaluation. However, we were still concerned about his distension and recommended him to continue with sorbitol  at home along with linzess  as he had tried linzess  before.    Concerns of UTI Pt has a hx of UTI, so we were concerned about him having a potential UTI initially. This was because his wife mentioned his low responsiveness after the shock-episodes was similar to his behavior during episodes of UTIs in the past. His initial UA was drawn from a foley bag that hadn't been changed in a month. Repeat UA after foley was changed showed no concern for a UTI with the pt having no symtoms. However, cx from initial urine sample came back positive for pseudomonas. We do think urine cx results were a reflection of him being colonized by P. Aeruginosa and not a true infection as pt had no systemic symtoms of fevers or leukocytosis.     Bilateral Sacral wound (Present on admission) Pt has sacral wounds in his bilateral gluteal region. Wife showed images from march and on comparison during admission (see images in H&P PE), his wounds looked a lot more healed with less bloody discharge on admission. Pt sees Dr. Rosan for wound care OP. Consulted WC once. Pt received Triamcinolone  and ketoconazole  cream in skin around buttocks. Pt to f/u with Dr. Rosan for [facility].    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 Wife mentioned that he has lower extremity paresis but has some movement in BL UE.   Chronic indwelling Foley catheter Pt has foly catheter in place. That is changed every month by a nurse. His catheter was changed on admission.      Depression Continued home lexapro  10 mg    Subjective Saw pt at  bedside this AM. Pt was feeling well. Denied any SOB, fevers, chills. Did not have a BM yesterday. Said he would like to go home on laxatives.   Discharge Exam:   BP 106/70 (BP Location: Right Arm)   Pulse 60   Temp 97.8 F (36.6 C) (Oral)   Resp 16   Ht 5' 10 (1.778 m)   Wt 86.9 kg   SpO2 92%   BMI 27.49 kg/m  Discharge exam:  Gen: A&Ox3, in NAD Abdomen: Distended but mildly soft on the upper quadrants Cardiovascular: RRR-no m/r/g Pulmonary: CTA-B Psych: Normal Affect  Pertinent Labs, Studies, and Procedures:     Latest Ref Rng & Units 06/29/2024    1:55 AM 06/28/2024    4:37 AM 06/27/2024    5:37 AM  CBC  WBC 4.0 - 10.5 K/uL 7.0  6.6  8.0   Hemoglobin 13.0 - 17.0 g/dL 86.3  85.8  86.1   Hematocrit 39.0 - 52.0 % 42.8  43.6  42.8   Platelets 150 - 400 K/uL 212  210  206        Latest Ref Rng & Units 06/29/2024    1:55 AM 06/28/2024    4:37 AM 06/27/2024    5:37 AM  CMP  Glucose 70 - 99 mg/dL  99  107  75   BUN 8 - 23 mg/dL 19  20  20    Creatinine 0.61 - 1.24 mg/dL 9.35  9.20  9.13   Sodium 135 - 145 mmol/L 145  144  145   Potassium 3.5 - 5.1 mmol/L 3.9  3.6  3.9   Chloride 98 - 111 mmol/L 106  104  105   CO2 22 - 32 mmol/L 27  26  25    Calcium 8.9 - 10.3 mg/dL 9.0  9.0  8.9     VAS US  ABI WITH/WO TBI Result Date: 06/26/2024  LOWER EXTREMITY DOPPLER STUDY Patient Name:  [name]  Date of Exam:   06/26/2024 Medical Rec #: [id number]        Accession #:    [id number] Date of Birth: May 21, 1961        Patient Gender: M Patient Age:   60 years Exam Location:  [facility] Procedure:      VAS US  ABI WITH/WO TBI Referring Phys: ELSIE SAVANNAH --------------------------------------------------------------------------------  Indications: Cold and Swollen lower extremities.  Comparison Study: No prior exam. Performing Technologist: Edilia Elden Appl  Examination Guidelines: A complete evaluation includes at minimum, Doppler waveform signals and systolic blood pressure  reading at the level of bilateral brachial, anterior tibial, and posterior tibial arteries, when vessel segments are accessible. Bilateral testing is considered an integral part of a complete examination. Photoelectric Plethysmograph (PPG) waveforms and toe systolic pressure readings are included as required and additional duplex testing as needed. Limited examinations for reoccurring indications may be performed as noted.  ABI Findings: +---------+------------------+-----+---------+--------+ Right    Rt Pressure (mmHg)IndexWaveform Comment  +---------+------------------+-----+---------+--------+ Brachial 106                    triphasic         +---------+------------------+-----+---------+--------+ PTA      136               1.26 triphasic         +---------+------------------+-----+---------+--------+ DP       127               1.18 triphasic         +---------+------------------+-----+---------+--------+ Great Toe254               2.35 Normal            +---------+------------------+-----+---------+--------+ +---------+------------------+-----+---------+-------+ Left     Lt Pressure (mmHg)IndexWaveform Comment +---------+------------------+-----+---------+-------+ Brachial 108                    triphasic        +---------+------------------+-----+---------+-------+ PTA      125               1.16 triphasic        +---------+------------------+-----+---------+-------+ DP       119               1.10 triphasic        +---------+------------------+-----+---------+-------+ Great Toe254               2.35 Normal           +---------+------------------+-----+---------+-------+ +-------+-----------+-----------+------------+------------+ ABI/TBIToday's ABIToday's TBIPrevious ABIPrevious TBI +-------+-----------+-----------+------------+------------+ Right  1.26       [place]                                   +-------+-----------+-----------+------------+------------+ Left   1.16  [place]                                  +-------+-----------+-----------+------------+------------+  Summary: Right: Resting right ankle-brachial index is within normal range. The right toe-brachial index is abnormal.  Left: Resting left ankle-brachial index is within normal range. The left toe-brachial index is abnormal.  *See table(s) above for measurements and observations.  Electronically signed by Lonni Gaskins MD on 06/26/2024 at 4:34:52 PM.    Final    ECHOCARDIOGRAM COMPLETE Result Date: 06/26/2024    ECHOCARDIOGRAM REPORT   Patient Name:   [name] Date of Exam: 06/26/2024 Medical Rec #:  [id number]       Height:       70.0 in Accession #:    [id number]      Weight:       190.9 lb Date of Birth:  04-16-1961       BSA:          2.047 m Patient Age:    63 years        BP:           100/66 mmHg Patient Gender: M               HR:           86 bpm. Exam Location:  Inpatient Procedure: 2D Echo, Cardiac Doppler, Color Doppler and Intracardiac            Opacification Agent (Both Spectral and Color Flow Doppler were            utilized during procedure). 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 Signs/Symptoms:Bacteremia, Shortness of Breath                 and Dyspnea. Multiple sclerosis.  Sonographer:    Ellouise Mose RDCS Referring Phys: [id number] ELSIE NOVAK [facility]  Sonographer Comments: Technically difficult study due to poor echo windows, suboptimal subcostal window and suboptimal parasternal window. Image acquisition challenging due to patient body habitus. IMPRESSIONS  1. Left ventricular ejection fraction, by estimation, is 55 to 60%. Left ventricular ejection fraction by 2D MOD biplane is 66.3 %. Left ventricular ejection  fraction by PLAX is 58 %. The left ventricle has normal function. The left ventricle has no regional wall motion abnormalities. Left ventricular diastolic parameters were normal.  2. Right ventricular systolic function is normal. The right ventricular size is normal.  3. The mitral valve is normal in structure. No evidence of mitral valve regurgitation. No evidence of mitral stenosis.  4. The aortic valve is tricuspid. Aortic valve regurgitation is not visualized. No aortic stenosis is present. FINDINGS  Left Ventricle: Left ventricular ejection fraction, by estimation, is 55 to 60%. Left ventricular ejection fraction by PLAX is 58 %. Left ventricular ejection fraction by 2D MOD biplane is 66.3 %. The left ventricle has normal function. The left ventricle has no regional wall motion abnormalities. Definity  contrast agent was given IV to delineate the left ventricular endocardial borders. The left ventricular internal cavity size was normal in size. There is no left ventricular hypertrophy. Left  ventricular diastolic parameters were normal. Right Ventricle: The right ventricular size is normal. No increase in right ventricular wall thickness. Right ventricular systolic function is normal. Left Atrium: Left atrial size was normal in size. Right Atrium: Right atrial size was normal in size. Pericardium: There is no evidence of pericardial effusion. Mitral Valve: The mitral valve is normal in structure. No evidence of mitral valve regurgitation. No evidence of mitral valve stenosis. Tricuspid Valve: The tricuspid valve is normal in structure. Tricuspid valve regurgitation is not demonstrated. No evidence of tricuspid stenosis. Aortic Valve: The aortic valve is tricuspid. Aortic valve regurgitation is not visualized. No aortic stenosis is present. Pulmonic Valve: The pulmonic valve was not well visualized. Pulmonic valve regurgitation is not visualized. No evidence of pulmonic stenosis. Aorta: The aortic root and  ascending aorta are structurally normal, with no evidence of dilitation. IAS/Shunts: No atrial level shunt detected by color flow Doppler.  LEFT VENTRICLE PLAX 2D                        Biplane EF (MOD) LV EF:         Left            LV Biplane EF:   Left                ventricular                      ventricular                ejection                         ejection                fraction by                      fraction by                PLAX is 58                       2D MOD                %.                               biplane is LVIDd:         3.40 cm                          66.3 %. LVIDs:         2.40 cm LV PW:         1.20 cm         Diastology LV IVS:        1.10 cm         LV e' medial:    11.10 cm/s LVOT diam:     2.40 cm         LV E/e' medial:  4.4 LV SV:         66              LV e' lateral:   9.25 cm/s LV SV Index:   32              LV E/e' lateral: 5.2 LVOT Area:  4.52 cm  LV Volumes (MOD) LV vol d, MOD    95.8 ml A2C: LV vol d, MOD    62.0 ml A4C: LV vol s, MOD    32.6 ml A2C: LV vol s, MOD    20.6 ml A4C: LV SV MOD A2C:   63.2 ml LV SV MOD A4C:   62.0 ml LV SV MOD BP:    52.2 ml RIGHT VENTRICLE             IVC RV S prime:     11.40 cm/s  IVC diam: 1.80 cm TAPSE (M-mode): 1.7 cm LEFT ATRIUM             Index        RIGHT ATRIUM           Index LA diam:        3.40 cm 1.66 cm/m   RA Area:     10.40 cm LA Vol (A2C):   30.3 ml 14.81 ml/m  RA Volume:   15.30 ml  7.48 ml/m LA Vol (A4C):   26.3 ml 12.85 ml/m LA Biplane Vol: 31.1 ml 15.20 ml/m  AORTIC VALVE LVOT Vmax:   95.40 cm/s LVOT Vmean:  58.500 cm/s LVOT VTI:    0.146 m  AORTA Ao Asc diam: 3.40 cm MITRAL VALVE MV Area (PHT): 4.02 cm    SHUNTS MV Decel Time: 189 msec    Systemic VTI:  0.15 m MV E velocity: 48.45 cm/s  Systemic Diam: 2.40 cm MV A velocity: 76.50 cm/s MV E/A ratio:  0.63 Aditya Sabharwal Electronically signed by Ria Commander Signature Date/Time: 06/26/2024/4:30:31 PM    Final    DG Chest Port 1 View Result Date:  06/25/2024 EXAM: 1 VIEW XRAY OF THE CHEST 06/25/2024 09:02:00 AM COMPARISON: 04/02/2024 CLINICAL HISTORY: Cough. Reason for exam: cough; Triage notes: Patient presents to ed via [facility] from home c/o generalized bodyaches, speaking is difficult for patient he has MS and is wheelchair bound, patient is alert, per ems patient was c/o headache and having body spasms. FINDINGS: LUNGS AND PLEURA: Low lung volumes. Possible small bilateral pleural effusions. Bibasilar opacities identified compatible with atelectasis or airspace disease. HEART AND MEDIASTINUM: No acute abnormality of the cardiac and mediastinal silhouettes. BONES AND SOFT TISSUES: No acute osseous abnormality. UPPER ABDOMEN: Large stool burden noted within the visualized colon within the upper abdomen. IMPRESSION: 1. Bibasilar opacities compatible with atelectasis or airspace disease. 2. Possible small bilateral pleural effusions. 3. Large stool burden in the visualized colon within the upper abdomen. Electronically signed by: Waddell Calk MD 06/25/2024 09:35 AM EDT RP Workstation: [id number]   CT Head Wo Contrast Result Date: 06/25/2024 CLINICAL DATA:  63 year old male with headache, body ache, wheelchair-bound. EXAM: CT HEAD WITHOUT CONTRAST TECHNIQUE: Contiguous axial images were obtained from the base of the skull through the vertex without intravenous contrast. RADIATION DOSE REDUCTION: This exam was performed according to the departmental dose-optimization program which includes automated exposure control, adjustment of the mA and/or kV according to patient size and/or use of iterative reconstruction technique. COMPARISON:  Brain MRI 11/09/2022. Head CT 10/19/2022. FINDINGS: Brain: Stable cerebral volume. No midline shift, ventriculomegaly, mass effect, evidence of mass lesion, intracranial hemorrhage or evidence of cortically based acute infarction. Gray-white matter differentiation is within normal limits throughout the brain. Vascular: No  suspicious intracranial vascular hyperdensity. Faint Calcified atherosclerosis at the skull base. Skull: Intact.  No acute osseous abnormality identified. Sinuses/Orbits: Mild bilateral ethmoid sinus mucosal thickening is new. Other Visualized  paranasal sinuses and mastoids are clear. Tympanic cavities appear clear. Other: Visualized orbit soft tissues are within normal limits. Visualized scalp soft tissues are within normal limits. IMPRESSION: 1. Stable and normal for age noncontrast CT appearance of the brain. 2. Mild new bilateral ethmoid sinus inflammation. Electronically Signed   By: VEAR Hurst M.D.   On: 06/25/2024 08:59     Discharge Instructions: Discharge Instructions     Call MD for:  difficulty breathing, headache or visual disturbances   Complete by: As directed    Call MD for:  severe uncontrolled pain   Complete by: As directed    Call MD for:  temperature >100.4   Complete by: As directed    Diet - low sodium heart healthy   Complete by: As directed    Discharge wound care:   Complete by: As directed    1. Cleanse L glute wound with Vashe wound cleanser, do not rinse and allow to air dry. Using a Q tip applicator apply Dakins moistened gauze to wound bed daily making sure to cover area of tunneling at 12 o'clock and any area of undermining.  Cover with dry gauze and ABD pad/tape or silicone foam whichever is preferred.  2.Cleanse R glute wound with Vashe, do not rinse and allow to air dry. Using a Q tip applicator insert silver hydrofiber (Aquacel [place] Collum [phone]) into wound bed daily and cover with silicone foam or ABD pad whichever is preferred.   Apply Ketoconazole  and Triamcinolone  creams to skin surrounding both wounds at time of dressing change   Increase activity slowly   Complete by: As directed        Signed: Edgardo Pontiff, DO 06/29/2024, 11:25 AM

## 2024-06-29 NOTE — TOC Progression Note (Signed)
 Transition of Care [facility]) - Progression Note    Patient Details  Name: [name] MRN: [id number] Date of Birth: Feb 25, 1961  Transition of Care [facility]) CM/SW Contact  Tom-Johnson, Dayshawn Irizarry Daphne, RN Phone Number: 06/29/2024, 1:10 PM  Clinical Narrative:     Patient scheduled for discharge today with home O2. Per previous Case Manager's note, patient will receive home O2 supplies from the [facility]. [facility] Continuous Pulse Oxymetry recordings faxed to [phone]. A Continuous Pulse Ox was documented but not the Overnight recording. MD notified, order placed and RT will get it done tonight. CM spoke with and notified Kecia with the Home Oxygen at the [facility].  CM will continue to follow as patient progresses with care towards discharge.                     Expected Discharge Plan and Services         Expected Discharge Date: 06/29/24                                     Social Drivers of Health (SDOH) Interventions SDOH Screenings   Food Insecurity: No Food Insecurity (06/25/2024)  Housing: Low Risk  (06/25/2024)  Transportation Needs: No Transportation Needs (06/25/2024)  Utilities: Patient Declined (06/25/2024)  Depression (PHQ2-9): Low Risk  (07/04/2020)  Social Connections: Unknown (07/07/2022)   Received from [facility]  Tobacco Use: Medium Risk (06/25/2024)    Readmission Risk Interventions    06/28/2024   11:26 AM 01/03/2024   12:10 PM 05/24/2023   11:51 AM  Readmission Risk Prevention Plan  Post Dischage Appt Complete Complete   Medication Screening Complete Complete   Transportation Screening Complete Complete Complete  PCP or Specialist Appt within 5-7 Days   Complete  Home Care Screening   Complete  Medication Review (RN CM)   Complete

## 2024-06-30 ENCOUNTER — Other Ambulatory Visit (HOSPITAL_COMMUNITY): Payer: Self-pay

## 2024-06-30 DIAGNOSIS — G500 Trigeminal neuralgia: Secondary | ICD-10-CM | POA: Diagnosis not present

## 2024-06-30 DIAGNOSIS — J9601 Acute respiratory failure with hypoxia: Secondary | ICD-10-CM | POA: Diagnosis not present

## 2024-06-30 DIAGNOSIS — K5641 Fecal impaction: Secondary | ICD-10-CM | POA: Diagnosis not present

## 2024-06-30 DIAGNOSIS — G35 Multiple sclerosis: Secondary | ICD-10-CM | POA: Diagnosis not present

## 2024-06-30 MED ORDER — AZITHROMYCIN 250 MG PO TABS
250.0000 mg | ORAL_TABLET | Freq: Every day | ORAL | 0 refills | Status: DC
  Filled 2024-06-30: qty 1, 1d supply, fill #0

## 2024-06-30 MED ORDER — AMOXICILLIN-POT CLAVULANATE 875-125 MG PO TABS
1.0000 | ORAL_TABLET | Freq: Two times a day (BID) | ORAL | 0 refills | Status: DC
  Filled 2024-06-30: qty 3, 2d supply, fill #0

## 2024-06-30 NOTE — Plan of Care (Signed)

## 2024-06-30 NOTE — Discharge Summary (Signed)
 Name: [name] MRN: [id number] DOB: 1961/09/13 63 y.o. PCP: [facility]  Date of Admission: 06/25/2024  7:00 AM Date of Discharge: 06/30/2024 Attending Physician: Dr. MICAEL Riis Winfrey  Discharge Diagnosis: 1. Principal Problem:   Acute hypoxic respiratory failure (HCC) Active Problems:   Multiple sclerosis, secondary progressive (HCC)   Neurogenic bladder   Spastic quadriparesis (HCC)   Depression   Trigeminal neuralgia   Sacral wound   Chronic indwelling Foley catheter   Pneumonia due to infectious organism    Discharge Medications: Allergies as of 06/30/2024       Reactions   Gadopiclenol  Nausea And Vomiting   Iodinated Contrast Media Nausea And Vomiting        Medication List     TAKE these medications    acetaminophen  500 MG tablet Commonly known as: TYLENOL  Take 1,000 mg by mouth as needed for mild pain (pain score 1-3) or moderate pain (pain score 4-6).   albuterol  108 (90 Base) MCG/ACT inhaler Commonly known as: VENTOLIN  HFA TAKE 2 PUFFS BY MOUTH EVERY 6 HOURS AS NEEDED What changed: See the new instructions.   amoxicillin -clavulanate 875-125 MG tablet Commonly known as: AUGMENTIN  Take 1 tablet by mouth 2 (two) times daily.   ascorbic acid  500 MG tablet Commonly known as: VITAMIN C  Take 500 mg by mouth 2 (two) times daily.   azithromycin  250 MG tablet Commonly known as: ZITHROMAX  Take 1 tablet (250 mg total) by mouth daily.   baclofen  10 MG tablet Commonly known as: LIORESAL  Take 10 mg by mouth daily as needed for muscle spasms.   bisacodyl  5 MG EC tablet Generic drug: bisacodyl  Take 2 tablets (10 mg total) by mouth daily as needed for moderate constipation.   CALCIUM 600 PO Take 1 capsule by mouth daily.   cholecalciferol  25 MCG (1000 UNIT) tablet Commonly known as: VITAMIN D3 Take 1,000 Units by mouth at bedtime.   cyanocobalamin  500 MCG tablet Commonly known as: VITAMIN B12 Take 500 mcg by mouth daily.    escitalopram  10 MG tablet Commonly known as: LEXAPRO  Take 1 tablet (10 mg total) by mouth daily.   ibuprofen  200 MG tablet Commonly known as: ADVIL  Take 600 mg by mouth as needed for headache or moderate pain.   ketoconazole  2 % shampoo Commonly known as: NIZORAL  Apply 1 Application topically See admin instructions. SHAMPOO AS DIRECTED AFFECTED AREA THREE TIMES A WEEK TO THE SCALP IN THE SHOWER LEAVE ON FOR 5 MINUTES THEN RINSE OFF. TO THE SCALP IN THE SHOWER LEAVE ON FOR 5 MINUTES THEN RINSE OFF. Every 4 days   lamoTRIgine  100 MG tablet Commonly known as: LAMICTAL  Take 100 mg by mouth in the morning, 100 mg at lunch and 200 mg by mouth at bedtime. What changed:  how much to take how to take this when to take this additional instructions   linaclotide  145 MCG Caps capsule Commonly known as: LINZESS  Take 1 capsule (145 mcg total) by mouth daily before breakfast.   methenamine 1 g tablet Commonly known as: MANDELAMINE Take 1,000 mg by mouth 2 (two) times daily. Only takes this medication when not on an antibiotic.   modafinil  100 MG tablet Commonly known as: PROVIGIL  Take 1 tablet by mouth every morning.   nystatin cream Commonly known as: MYCOSTATIN Apply 1 Application topically daily as needed for dry skin.   ocrelizumab 600 mg in sodium chloride  0.9 % 500 mL Inject 600 mg into the vein every 6 (six) months.   oxyCODONE -acetaminophen   5-325 MG tablet Commonly known as: Percocet Take 1 tablet by mouth every 12 (twelve) hours as needed for severe pain (pain score 7-10).   polyethylene glycol powder 17 GM/SCOOP powder Commonly known as: GLYCOLAX /MIRALAX  Dissolve 1 capful (17g) in 4-8 ounces of liquid and take by mouth daily as needed   pregabalin  150 MG capsule Commonly known as: LYRICA  Take 1 capsule (150 mg total) by mouth 3 (three) times daily.   Stool Softener/Laxative 50-8.6 MG tablet Generic drug: senna-docusate Take 1 tablet by mouth at bedtime as needed for  mild constipation.   THERATEARS OP Place 1 drop into both eyes 4 (four) times daily as needed (dry eye).               Durable Medical Equipment  (From admission, onward)           Start     Ordered   06/29/24 1103  DME Oxygen  Once       Question Answer Comment  Length of Need 6 Months   Oxygen delivery system Gas      06/29/24 1106   06/28/24 1230  For home use only DME oxygen  Once       Question Answer Comment  Length of Need 6 Months   Mode or (Route) Nasal cannula   Liters per Minute 1   Frequency Only at night (stationary unit needed)   Oxygen delivery system Gas      06/28/24 1229              Discharge Care Instructions  (From admission, onward)           Start     Ordered   06/30/24 0000  Discharge wound care:       Comments: 1. Cleanse L glute wound with Vashe wound cleanser, do not rinse and allow to air dry. Using a Q tip applicator apply Dakins moistened gauze to wound bed daily making sure to cover area of tunneling at 12 o'clock and any area of undermining.  Cover with dry gauze and ABD pad/tape or silicone foam whichever is preferred.  2.Cleanse R glute wound with Vashe, do not rinse and allow to air dry. Using a Q tip applicator insert silver hydrofiber (Aquacel ANDRIA Collum [id number]) into wound bed daily and cover with silicone foam or ABD pad whichever is preferred.   Apply Ketoconazole  and Triamcinolone  creams to skin surrounding both wounds at time of dressing change  06/25/24 1726   06/30/24 1320   06/29/24 0000  Discharge wound care:       Comments: 1. Cleanse L glute wound with Vashe wound cleanser, do not rinse and allow to air dry. Using a Q tip applicator apply Dakins moistened gauze to wound bed daily making sure to cover area of tunneling at 12 o'clock and any area of undermining.  Cover with dry gauze and ABD pad/tape or silicone foam whichever is preferred.  2.Cleanse R glute wound with Vashe, do not rinse and allow to air dry.  Using a Q tip applicator insert silver hydrofiber (Aquacel ANDRIA Collum [id number]) into wound bed daily and cover with silicone foam or ABD pad whichever is preferred.   Apply Ketoconazole  and Triamcinolone  creams to skin surrounding both wounds at time of dressing change   06/29/24 1106            Disposition and follow-up:   Mr.Julen F Mutz was discharged from [facility] in Good condition.  At the hospital follow  up visit please address:  1.  SOB, Worsening of shocks on the left side of the head 2.  Ask about BM as his imaging and abdominal distension throughout admission were concerning for constipation.  3. Home O2 supply can be discontinued if pt is satting well on RA and there is no further indication for it.    2.  Labs / imaging needed at time of follow-up: KUB for constipation   3.  Pending labs/ test needing follow-up: none    Follow-up Appointments:  Follow-up Information     [facility] [place], [place] Follow up.   Why: [facility] will continue to provide [facility] RN at the home. Contact information: [address] [place] [address] [phone]         [facility] Schedule an appointment as soon as possible for a visit in 1 week(s).   Contact information: [address] [facility] [place] [address] [phone]                  Hospital Course by problem list: [name] is a 63 y.o. person living with a history of MS, TN who presented with shocks on left side of his head and admitted for acute hypoxic respiratory failure.    Principal Problem: Acute hypoxic respiratory failure (HCC): On admission, the pt said it was his SOB and shocks on the left side of the head that bothered him the most. His SOB had started recently. He was given a dose of IV rocephin  and azithromycin  in the ED as his CXR showed concerns of PNA with bibasilar opacities and bilateral pleural  effusions. On PE, pt appeared volume overloaded with swelling in BL LE and crackles in posterior lung fields. Pt also required supplemental O2 as hos O2 sats were dropping to 88% on RA with minimal exertion. Pt denied any fevers, new cough with sputum production. However, he had elevated WBC count. Initially we suspected HF to be the cause of his symptoms, however, his BNP levels came back normal and his echo showed an EF of 55-60% with normal systolic and diastolic function of LV and RV. Procal levels indicated restarting pt back on abx. Pt received 3 days of IV rocephin  and azithromycin . His symptoms improved with each day and leukocytosis resolved, however, his O2 requirements went up to 3L as his O2 saturation levels were dropping to 88% on 2L. On discharge evaluation, pt denied any complaints of SOB. He denied any fevers, chill, or acute complaints. We transitioned him to PO Augmentin  and azithromycin . He was stable to be discharged. Pt to take a 1 day course of Augmentin  875-125 mg BID and Azithromycin  250 mg once daily upon discharge to complete 7-day course of abx. Pt also discharged with a supply of supplemental O2.   Trigeminal neuralgia Pt came in primarily for shock like pain on the left side of the head that had started the night before his admission. He was accompanied by his wife who did most of the talking as pt was very weak. Wife mentioned that pt has trigeminal neuralgia with pains that occur on his right jaw. Wife said that pt has never had TN on the left side of his face. Due to his hx of TN, his recent sharp left-sided shocks were likely due to a TN flare on a different location of his face. Per wife, pt took baclofen , lamotrigine , lyrica  at home. We continued treating him with home lamotrigine   100mg , percocet PRN, baclofen  10 mg, Lyrica  150 mg. He only had one more episode of shocks on the left side of his head during the beginning of his admission which subsided later. Pt did not complain  about shocks on the left side of the head on d/c evaluation.   Multiple sclerosis, secondary progressive (HCC) Pt has a hx of MS but wife said his flares are hard to describe. They don't cause him any active pain and his shocks on the head were not similar to his MS flares. Pt receives Ocrelizumab 600 mg every 6 months and his next dose is July 31. We were not concerned for an acute flare that was causing his symptoms.  Large Stool Burden CXR on admission showed large stool burden in the visualized colon of upper abdomen. Wife mentioned pt had no changes in BM--normally has 2 BM per day and receives enemas when he is not able to which helps him. Explained to wife that enemas don't have much effect on upper abdomen. Pt started on senna, increased dose of miralax , and Mg citrate to help with complete BM. He also received sorbitol , dulcolax to help with his constipation. Pt's abdomen was distended when he came in and appeared a bit soft on discharge day evaluation. However, we were still concerned about his distension and recommended him to continue with sorbitol  at home along with linzess  as he had tried linzess  before.    Concerns of UTI Pt has a hx of UTI, so we were concerned about him having a potential UTI initially. This was because his wife mentioned his low responsiveness after the shock-episodes was similar to his behavior during episodes of UTIs in the past. His initial UA was drawn from a foley bag that hadn't been changed in a month. Repeat UA after foley was changed showed no concern for a UTI with the pt having no symtoms. However, cx from initial urine sample came back positive for pseudomonas. We do think urine cx results were a reflection of him being colonized by P. Aeruginosa and not a true infection as pt had no systemic symtoms of fevers or leukocytosis.     Bilateral Sacral wound (Present on admission) Pt has sacral wounds in his bilateral gluteal region. Wife showed images from  march and on comparison during admission (see images in H&P PE), his wounds looked a lot more healed with less bloody discharge on admission. Pt sees Dr. Rosan for wound care OP. Consulted WC once. Pt received Triamcinolone  and ketoconazole  cream in skin around buttocks. Pt to f/u with Dr. Rosan for [facility].    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 Wife mentioned that he has lower extremity paresis but has some movement in BL UE.   Chronic indwelling Foley catheter Pt has foly catheter in place. That is changed every month by a nurse. His catheter was changed on admission.      Depression Continued home lexapro  10 mg     Subjective Saw pt at bedside on final evaluation. He denied any SOB, fevers, chills. Said he would like to go home on laxatives.   Discharge Exam:   BP 112/64 (BP Location: Right Arm)   Pulse 64   Temp 97.8 F (36.6 C) (Oral)   Resp 17   Ht 5' 10 (1.778 m)   Wt 87.5 kg   SpO2 90%   BMI 27.68 kg/m  Discharge exam:  Gen: A&Ox3, in NAD Abdomen: Distended but mildly  soft on the upper quadrants Cardiovascular: RRR-no m/r/g Pulmonary: CTA-B Psych: Normal Affect  Pertinent Labs, Studies, and Procedures:     Latest Ref Rng & Units 06/29/2024    1:55 AM 06/28/2024    4:37 AM 06/27/2024    5:37 AM  CBC  WBC 4.0 - 10.5 K/uL 7.0  6.6  8.0   Hemoglobin 13.0 - 17.0 g/dL 86.3  85.8  86.1   Hematocrit 39.0 - 52.0 % 42.8  43.6  42.8   Platelets 150 - 400 K/uL 212  210  206        Latest Ref Rng & Units 06/29/2024    1:55 AM 06/28/2024    4:37 AM 06/27/2024    5:37 AM  CMP  Glucose 70 - 99 mg/dL 99  892  75   BUN 8 - 23 mg/dL 19  20  20    Creatinine 0.61 - 1.24 mg/dL 9.35  9.20  9.13   Sodium 135 - 145 mmol/L 145  144  145   Potassium 3.5 - 5.1 mmol/L 3.9  3.6  3.9   Chloride 98 - 111 mmol/L 106  104  105   CO2 22 - 32 mmol/L 27  26  25    Calcium 8.9 - 10.3 mg/dL 9.0  9.0  8.9     VAS US  ABI WITH/WO TBI Result  Date: 06/26/2024  LOWER EXTREMITY DOPPLER STUDY Patient Name:  [name]  Date of Exam:   06/26/2024 Medical Rec #: [id number]        Accession #:    [id number] Date of Birth: 1961/05/21        Patient Gender: M Patient Age:   76 years Exam Location:  [facility] Procedure:      VAS US  ABI WITH/WO TBI Referring Phys: ELSIE SAVANNAH --------------------------------------------------------------------------------  Indications: Cold and Swollen lower extremities.  Comparison Study: No prior exam. Performing Technologist: Edilia Elden Appl  Examination Guidelines: A complete evaluation includes at minimum, Doppler waveform signals and systolic blood pressure reading at the level of bilateral brachial, anterior tibial, and posterior tibial arteries, when vessel segments are accessible. Bilateral testing is considered an integral part of a complete examination. Photoelectric Plethysmograph (PPG) waveforms and toe systolic pressure readings are included as required and additional duplex testing as needed. Limited examinations for reoccurring indications may be performed as noted.  ABI Findings: +---------+------------------+-----+---------+--------+ Right    Rt Pressure (mmHg)IndexWaveform Comment  +---------+------------------+-----+---------+--------+ Brachial 106                    triphasic         +---------+------------------+-----+---------+--------+ PTA      136               1.26 triphasic         +---------+------------------+-----+---------+--------+ DP       127               1.18 triphasic         +---------+------------------+-----+---------+--------+ Great Toe254               2.35 Normal            +---------+------------------+-----+---------+--------+ +---------+------------------+-----+---------+-------+ Left     Lt Pressure (mmHg)IndexWaveform Comment +---------+------------------+-----+---------+-------+ Brachial 108                    triphasic         +---------+------------------+-----+---------+-------+ PTA      125  1.16 triphasic        +---------+------------------+-----+---------+-------+ DP       119               1.10 triphasic        +---------+------------------+-----+---------+-------+ Great Toe254               2.35 Normal           +---------+------------------+-----+---------+-------+ +-------+-----------+-----------+------------+------------+ ABI/TBIToday's ABIToday's TBIPrevious ABIPrevious TBI +-------+-----------+-----------+------------+------------+ Right  1.26       [facility]                                  +-------+-----------+-----------+------------+------------+ Left   1.16                                         +-------+-----------+-----------+------------+------------+  Summary: Right: Resting right ankle-brachial index is within normal range. The right toe-brachial index is abnormal.  Left: Resting left ankle-brachial index is within normal range. The left toe-brachial index is abnormal.  *See table(s) above for measurements and observations.  Electronically signed by Lonni Gaskins MD on 06/26/2024 at 4:34:52 PM.    Final    ECHOCARDIOGRAM COMPLETE Result Date: 06/26/2024    ECHOCARDIOGRAM REPORT   Patient Name:   [name] Date of Exam: 06/26/2024 Medical Rec #:  [id number]       Height:       70.0 in Accession #:    [id number]      Weight:       190.9 lb Date of Birth:  02/07/1961       BSA:          2.047 m Patient Age:    63 years        BP:           100/66 mmHg Patient Gender: M               HR:           86 bpm. Exam Location:  Inpatient Procedure: 2D Echo, Cardiac Doppler, Color Doppler and Intracardiac            Opacification Agent (Both Spectral and Color Flow Doppler were            utilized during procedure). Indications:    I50.40* Unspecified combined systolic (congestive) and diastolic                 (congestive) heart failure  History:        Patient has  prior history of Echocardiogram examinations, most                 recent 02/24/2020. Signs/Symptoms:Bacteremia, Shortness of Breath                 and Dyspnea. Multiple sclerosis.  Sonographer:    Ellouise Mose RDCS Referring Phys: [id number] ELSIE NOVAK [facility]  Sonographer Comments: Technically difficult study due to poor echo windows, suboptimal subcostal window and suboptimal parasternal window. Image acquisition challenging due to patient body habitus. IMPRESSIONS  1. Left ventricular ejection fraction, by estimation, is 55 to 60%. Left ventricular ejection fraction by 2D MOD biplane is 66.3 %. Left ventricular ejection fraction by PLAX is 58 %. The left ventricle has normal function. The left ventricle has no regional wall motion abnormalities. Left ventricular diastolic parameters were  normal.  2. Right ventricular systolic function is normal. The right ventricular size is normal.  3. The mitral valve is normal in structure. No evidence of mitral valve regurgitation. No evidence of mitral stenosis.  4. The aortic valve is tricuspid. Aortic valve regurgitation is not visualized. No aortic stenosis is present. FINDINGS  Left Ventricle: Left ventricular ejection fraction, by estimation, is 55 to 60%. Left ventricular ejection fraction by PLAX is 58 %. Left ventricular ejection fraction by 2D MOD biplane is 66.3 %. The left ventricle has normal function. The left ventricle has no regional wall motion abnormalities. Definity  contrast agent was given IV to delineate the left ventricular endocardial borders. The left ventricular internal cavity size was normal in size. There is no left ventricular hypertrophy. Left ventricular diastolic parameters were normal. Right Ventricle: The right ventricular size is normal. No increase in right ventricular wall thickness. Right ventricular systolic function is normal. Left Atrium: Left atrial size was normal in size. Right Atrium: Right atrial size was normal in size. Pericardium:  There is no evidence of pericardial effusion. Mitral Valve: The mitral valve is normal in structure. No evidence of mitral valve regurgitation. No evidence of mitral valve stenosis. Tricuspid Valve: The tricuspid valve is normal in structure. Tricuspid valve regurgitation is not demonstrated. No evidence of tricuspid stenosis. Aortic Valve: The aortic valve is tricuspid. Aortic valve regurgitation is not visualized. No aortic stenosis is present. Pulmonic Valve: The pulmonic valve was not well visualized. Pulmonic valve regurgitation is not visualized. No evidence of pulmonic stenosis. Aorta: The aortic root and ascending aorta are structurally normal, with no evidence of dilitation. IAS/Shunts: No atrial level shunt detected by color flow Doppler.  LEFT VENTRICLE PLAX 2D                        Biplane EF (MOD) LV EF:         Left            LV Biplane EF:   Left                ventricular                      ventricular                ejection                         ejection                fraction by                      fraction by                PLAX is 58                       2D MOD                %.                               biplane is LVIDd:         3.40 cm                          66.3 %. LVIDs:  2.40 cm LV PW:         1.20 cm         Diastology LV IVS:        1.10 cm         LV e' medial:    11.10 cm/s LVOT diam:     2.40 cm         LV E/e' medial:  4.4 LV SV:         66              LV e' lateral:   9.25 cm/s LV SV Index:   32              LV E/e' lateral: 5.2 LVOT Area:     4.52 cm  LV Volumes (MOD) LV vol d, MOD    95.8 ml A2C: LV vol d, MOD    62.0 ml A4C: LV vol s, MOD    32.6 ml A2C: LV vol s, MOD    20.6 ml A4C: LV SV MOD A2C:   63.2 ml LV SV MOD A4C:   62.0 ml LV SV MOD BP:    52.2 ml RIGHT VENTRICLE             IVC RV S prime:     11.40 cm/s  IVC diam: 1.80 cm TAPSE (M-mode): 1.7 cm LEFT ATRIUM             Index        RIGHT ATRIUM           Index LA diam:        3.40 cm 1.66 cm/m    RA Area:     10.40 cm LA Vol (A2C):   30.3 ml 14.81 ml/m  RA Volume:   15.30 ml  7.48 ml/m LA Vol (A4C):   26.3 ml 12.85 ml/m LA Biplane Vol: 31.1 ml 15.20 ml/m  AORTIC VALVE LVOT Vmax:   95.40 cm/s LVOT Vmean:  58.500 cm/s LVOT VTI:    0.146 m  AORTA Ao Asc diam: 3.40 cm MITRAL VALVE MV Area (PHT): 4.02 cm    SHUNTS MV Decel Time: 189 msec    Systemic VTI:  0.15 m MV E velocity: 48.45 cm/s  Systemic Diam: 2.40 cm MV A velocity: 76.50 cm/s MV E/A ratio:  0.63 Aditya Sabharwal Electronically signed by Ria Commander Signature Date/Time: 06/26/2024/4:30:31 PM    Final    DG Chest Port 1 View Result Date: 06/25/2024 EXAM: 1 VIEW XRAY OF THE CHEST 06/25/2024 09:02:00 AM COMPARISON: 04/02/2024 CLINICAL HISTORY: Cough. Reason for exam: cough; Triage notes: Patient presents to ed via [facility] from home c/o generalized bodyaches, speaking is difficult for patient he has MS and is wheelchair bound, patient is alert, per ems patient was c/o headache and having body spasms. FINDINGS: LUNGS AND PLEURA: Low lung volumes. Possible small bilateral pleural effusions. Bibasilar opacities identified compatible with atelectasis or airspace disease. HEART AND MEDIASTINUM: No acute abnormality of the cardiac and mediastinal silhouettes. BONES AND SOFT TISSUES: No acute osseous abnormality. UPPER ABDOMEN: Large stool burden noted within the visualized colon within the upper abdomen. IMPRESSION: 1. Bibasilar opacities compatible with atelectasis or airspace disease. 2. Possible small bilateral pleural effusions. 3. Large stool burden in the visualized colon within the upper abdomen. Electronically signed by: Waddell Calk MD 06/25/2024 09:35 AM EDT RP Workstation: [id number]   CT Head Wo Contrast Result Date: 06/25/2024 CLINICAL DATA:  63 year old male with headache, body ache, wheelchair-bound. EXAM:  CT HEAD WITHOUT CONTRAST TECHNIQUE: Contiguous axial images were obtained from the base of the skull through the vertex without  intravenous contrast. RADIATION DOSE REDUCTION: This exam was performed according to the departmental dose-optimization program which includes automated exposure control, adjustment of the mA and/or kV according to patient size and/or use of iterative reconstruction technique. COMPARISON:  Brain MRI 11/09/2022. Head CT 10/19/2022. FINDINGS: Brain: Stable cerebral volume. No midline shift, ventriculomegaly, mass effect, evidence of mass lesion, intracranial hemorrhage or evidence of cortically based acute infarction. Gray-white matter differentiation is within normal limits throughout the brain. Vascular: No suspicious intracranial vascular hyperdensity. Faint Calcified atherosclerosis at the skull base. Skull: Intact.  No acute osseous abnormality identified. Sinuses/Orbits: Mild bilateral ethmoid sinus mucosal thickening is new. Other Visualized paranasal sinuses and mastoids are clear. Tympanic cavities appear clear. Other: Visualized orbit soft tissues are within normal limits. Visualized scalp soft tissues are within normal limits. IMPRESSION: 1. Stable and normal for age noncontrast CT appearance of the brain. 2. Mild new bilateral ethmoid sinus inflammation. Electronically Signed   By: VEAR Hurst M.D.   On: 06/25/2024 08:59     Discharge Instructions: Discharge Instructions     Call MD for:  difficulty breathing, headache or visual disturbances   Complete by: As directed    Call MD for:  severe uncontrolled pain   Complete by: As directed    Call MD for:  temperature >100.4   Complete by: As directed    Diet - low sodium heart healthy   Complete by: As directed    Diet - low sodium heart healthy   Complete by: As directed    Discharge wound care:   Complete by: As directed    1. Cleanse L glute wound with Vashe wound cleanser, do not rinse and allow to air dry. Using a Q tip applicator apply Dakins moistened gauze to wound bed daily making sure to cover area of tunneling at 12 o'clock and any  area of undermining.  Cover with dry gauze and ABD pad/tape or silicone foam whichever is preferred.  2.Cleanse R glute wound with Vashe, do not rinse and allow to air dry. Using a Q tip applicator insert silver hydrofiber (Aquacel ANDRIA Collum [id number]) into wound bed daily and cover with silicone foam or ABD pad whichever is preferred.   Apply Ketoconazole  and Triamcinolone  creams to skin surrounding both wounds at time of dressing change   Discharge wound care:   Complete by: As directed    1. Cleanse L glute wound with Vashe wound cleanser, do not rinse and allow to air dry. Using a Q tip applicator apply Dakins moistened gauze to wound bed daily making sure to cover area of tunneling at 12 o'clock and any area of undermining.  Cover with dry gauze and ABD pad/tape or silicone foam whichever is preferred.  2.Cleanse R glute wound with Vashe, do not rinse and allow to air dry. Using a Q tip applicator insert silver hydrofiber (Aquacel ANDRIA Collum [id number]) into wound bed daily and cover with silicone foam or ABD pad whichever is preferred.   Apply Ketoconazole  and Triamcinolone  creams to skin surrounding both wounds at time of dressing change  06/25/24 1726   Increase activity slowly   Complete by: As directed    Increase activity slowly   Complete by: As directed        Signed: Edgardo Pontiff, DO 06/30/2024, 1:25 PM

## 2024-06-30 NOTE — Progress Notes (Signed)
 AVS DNR and LCSW paperwork in packet for transport

## 2024-06-30 NOTE — TOC Transition Note (Signed)
 Transition of Care [facility]) - Discharge Note   Patient Details  Name: [name] MRN: [id number] Date of Birth: 02/24/1961  [facility]) CM/SW Contact:  [name], Mark Dacus Daphne, RN Phone Number: 06/30/2024, 1:57 PM   Clinical Narrative:     Patient is scheduled for discharge today.  Readmission Risk Assessment done. Home health info, outpatient f/u, hospital f/u and discharge instructions on AVS. Patient's home O2 approved by the [place] and [place] will deliver to patient's home. Wife and MD made aware. Prescriptions sent to [facility] pharmacy and patient will receive meds prior discharge. Wife, [name] at bedside and will transport at discharge.  No further ICM needs noted.       Final next level of care: Home w Home Health Services Barriers to Discharge: Barriers Resolved   Patient Goals and CMS Choice Patient states their goals for this hospitalization and ongoing recovery are:: To return home CMS Medicare.gov Compare Post Acute Care list provided to:: Patient Choice offered to / list presented to : Patient, Spouse      Discharge Placement                Patient to be transferred to facility by: Wife Name of family member notified: [name]    Discharge Plan and Services Additional resources added to the After Visit Summary for                  DME Arranged: N/A         HH Arranged: RN HH Agency: [facility]        Social Drivers of Health (SDOH) Interventions SDOH Screenings   Food Insecurity: No Food Insecurity (06/25/2024)  Housing: Low Risk  (06/25/2024)  Transportation Needs: No Transportation Needs (06/25/2024)  Utilities: Patient Declined (06/25/2024)  Depression (PHQ2-9): Low Risk  (07/04/2020)  Social Connections: Unknown (07/07/2022)   Received from [facility]  Tobacco Use: Medium Risk (06/25/2024)     Readmission Risk Interventions    06/28/2024   11:26 AM 01/03/2024   12:10 PM 05/24/2023   11:51 AM  Readmission  Risk Prevention Plan  Post Dischage Appt Complete Complete   Medication Screening Complete Complete   Transportation Screening Complete Complete Complete  PCP or Specialist Appt within 5-7 Days   Complete  Home Care Screening   Complete  Medication Review (RN CM)   Complete

## 2024-07-02 LAB — URINE CULTURE: Culture: >=100000 — AB

## 2024-07-04 ENCOUNTER — Encounter: Payer: Self-pay | Admitting: Neurology

## 2024-07-04 ENCOUNTER — Other Ambulatory Visit: Payer: Self-pay | Admitting: *Deleted

## 2024-07-04 MED ORDER — ALBUTEROL SULFATE HFA 108 (90 BASE) MCG/ACT IN AERS: 2.0000 | INHALATION_SPRAY | Freq: Four times a day (QID) | RESPIRATORY_TRACT | 0 refills | Status: AC | PRN

## 2024-07-08 ENCOUNTER — Encounter: Payer: Self-pay | Admitting: Neurology

## 2024-07-10 ENCOUNTER — Encounter: Payer: Self-pay | Admitting: Neurology

## 2024-07-10 ENCOUNTER — Other Ambulatory Visit: Payer: Self-pay | Admitting: Neurology

## 2024-07-10 MED ORDER — MODAFINIL 100 MG PO TABS: 100.0000 mg | ORAL_TABLET | Freq: Every morning | ORAL | 1 refills | Status: AC

## 2024-07-19 ENCOUNTER — Encounter (HOSPITAL_BASED_OUTPATIENT_CLINIC_OR_DEPARTMENT_OTHER): Attending: Internal Medicine | Admitting: Internal Medicine

## 2024-07-19 DIAGNOSIS — L89314 Pressure ulcer of right buttock, stage 4: Secondary | ICD-10-CM | POA: Insufficient documentation

## 2024-07-19 DIAGNOSIS — Z6825 Body mass index (BMI) 25.0-25.9, adult: Secondary | ICD-10-CM | POA: Diagnosis not present

## 2024-07-19 DIAGNOSIS — E440 Moderate protein-calorie malnutrition: Secondary | ICD-10-CM | POA: Diagnosis not present

## 2024-07-19 DIAGNOSIS — L89324 Pressure ulcer of left buttock, stage 4: Secondary | ICD-10-CM | POA: Insufficient documentation

## 2024-07-24 ENCOUNTER — Encounter: Payer: Self-pay | Admitting: Neurology

## 2024-07-24 ENCOUNTER — Other Ambulatory Visit: Payer: Self-pay

## 2024-07-24 MED ORDER — PREGABALIN 150 MG PO CAPS: 150.0000 mg | ORAL_CAPSULE | Freq: Three times a day (TID) | ORAL | 5 refills | Status: AC

## 2024-07-24 MED ORDER — PREGABALIN 150 MG PO CAPS: 150.0000 mg | ORAL_CAPSULE | Freq: Three times a day (TID) | ORAL | 5 refills | Status: DC

## 2024-07-24 NOTE — Telephone Encounter (Signed)
 Pt Last Seen 12/28/2023 Upcoming Appointment 07/27/2024  Lyrica  Last Filled 01/19/24

## 2024-07-24 NOTE — Telephone Encounter (Signed)
 Requested Prescriptions   Pending Prescriptions Disp Refills   pregabalin  (LYRICA ) 150 MG capsule 90 capsule 5    Sig: Take 1 capsule (150 mg total) by mouth 3 (three) times daily.   Last seen 12/28/23 Next appt 07/27/24 Dispenses  No dispenses within 180 days of the adherence period (since 07/29/2023)   How do dispenses affect the score?    Outpatient Orders     The patient is taking this medication long-term.     Start Date End Date Dispense Refills Pharmacy   pregabalin  (LYRICA ) 150 MG capsule 01/19/2024 -- 90 capsule 5/5 --   Take 1 capsule (150 mg total) by mouth 3 (three) times daily.    pregabalin  (LYRICA ) 150 MG capsule 07/21/2023 01/19/2024 90 capsule 5/5 --   Take 1 capsule (150 mg total) by mouth 3 (three) times daily.    pregabalin  (LYRICA ) 150 MG capsule 01/18/2023 07/21/2023 90 capsule 5/5 --  Unable to check registry

## 2024-07-26 ENCOUNTER — Other Ambulatory Visit: Payer: Self-pay

## 2024-07-26 MED ORDER — PREGABALIN 150 MG PO CAPS: 150.0000 mg | ORAL_CAPSULE | Freq: Three times a day (TID) | ORAL | 5 refills | Status: AC

## 2024-07-26 NOTE — Telephone Encounter (Signed)
 Re-Faxed pt's script to the [facility] at [phone] on 07/26/24.

## 2024-07-26 NOTE — Progress Notes (Unsigned)
 [facility]  PATIENT: [name] DOB: 12/22/1960  REFERRING DOCTOR OR PCP: Allean Irving, MD SOURCE: Patient, notes from Dr. Jenel, imaging and lab reports, MRI images personally reviewed.  _________________________________   HISTORICAL  CHIEF COMPLAINT:  No chief complaint on file.   HISTORY OF PRESENT ILLNESS:  [name] is a 63 y.o. man with a relapsing form of secondary progressive MS.  Update 12/28/2023:  He was admitted to the hospital 06/25/2024 with hypoxia/shortness of breath and chest x-ray was concerning for pneumonia with bibasilar opacities.  He was placed on IV Rocephin  and azithromycin .  He has been seeing wound care for sacral decubiti.    ***He is on Ocrevus - last infusion July 2024.  We discussed that it is strongest MS medication but that it could increased infection risk.   Would need to consider stopping it wounds worsen     He is wheelchair bound.    He last used a cane in 2009 and then [place] crutches..   He has needed a lift to transfer since a long hospital stay for a pseudomonas UTI (urosepsis) May 2021.     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   His spasticity is bothersome but baclofen  makes him sleepy.  Therefore he usually just takes 1 a day..  Tizanidine  caused hallucinations.  He has numbness waist down.  He has bilateral INO.    Trigeminal neuralgia:   Pain is much better.   MRI of the brain with added attention to the trigeminal nerves showed the right superior cerebellar artery effaces the cisternal segment of the right trigeminal nerve without causing definite compression.  There is no dorsal root entry zone or transitional zone compression.  There are multiple brainstem lesions but none near the dorsal root entry zone of the trigeminal nerve.   Neurosurgery dd not think he was a good candidate for decompression.    Currently he takes lamotrigine  100 mg po bid and 200 mg po qHS and pregabalin   150 mg po tid.     If pain is more severe he can go up to 200 mg po tid and Lyrica  150 mg up to 4 a day.     He sometimes takes an oxycodone  at night.    He saw [facility] and had a block followed by an aboation for the right TN.  Unfortunately it did not help.   Oxcarbazepine  had not helped.   In the hospital fosphenytoin  infusion may have given short term benefit.  When pain is severe he does not eat and has ost weight.   Due to anatomy, he is not a good candidate for a simple feeding tube and recovery from open surgery might be slow.     He notes more issues with word finding.   He has dysphagia .  Due to anatomy, a Feeding tube would be difficult without open surgery so they have opted to wait.    He has noted more left arm weakness/spasticity.   Baclofen  makes him sleepy so he just takes 1 as needed.   They have some exercise equipment from OT but does not use it    He is chair/bed bound and does have a couple sores under the buttock.  He sees Wound care and is improving.   He has had skin breakdown on several occasions.  He is currently seeing wound care.  His wife packs the current wound as directed by wound care.  MS History: He was diagnosed in 2007 after presenting with gradual worsening of balance.  Around that time, he had hurt his knee but even when knee improved, he flet off balance and would hold the wall or furniture at time.   In retrospect, he had some symptoms in 2001.  At that time an MRI was reportedly normal.  That year, he had fairly sudden onset of numbness below his waist that gradually improved over the next few weeks.     He was in a trial comparing Rebif and Betaseron n 2007 and was initially on Rebif and then changed to Betaseron.   He had injection site reactions.   He started taking GIlenya  in 2011.    He switched to Ocrevus in 2019.    He has a strong FH of MS with two sisters, one niece.   IMAGING: MRI of the face 11/09/2022 showed no vascular loop  compressing the right trigeminal nerve though the superior cerebellar artery was adjacent to the cisternal segment of the nerve.  There were multiple brainstem lesions but none near the dorsal root entry zone on the right.  MRI of the brain and cervical spine 07/07/2022 was unchanged compared to 2020.  MRI of the head 07/11/2019 shows T2 hyperintense foci within the left posterior medulla, cerebellar hemispheres, lower left central pons, right midbrain, left thalamus, and in the periventricular, juxtacortical and deep white matter of both hemispheres.  No definite changes compared to the MRI from 08/01/2014.  MRI cervical spine 07/11/2019 shows foci seen on the previous MRI as well as a focus at T2-T3.  MRI of the brain 08/01/2014 shows T2 hyperintense foci within the left posterior medulla, cerebellar hemispheres, lower left central pons, right midbrain, left thalamus, and in the periventricular, juxtacortical and deep white matter of both hemispheres.  No definite changes compared to the MRI from 08/01/2014.  The infratentorial lesions were not clearly present on the previous MRI from 2007 and there has also been some progression in the hemispheres.SABRA  MRI of the brain 01/11/2006 shows T2/FLAIR hyperintense foci in the hemispheres consistent with MS  MRI of the cervical spine 01/10/2006 shows T2 hyperintense foci within the spinal cord Adjacent to C5, centrally towards the left adjacent to T1 and at T2-T3  REVIEW OF SYSTEMS: Constitutional: No fevers, chills, sweats, or change in appetite Eyes: No visual changes, double vision, eye pain Ear, nose and throat: No hearing loss, ear pain, nasal congestion, sore throat Cardiovascular: No chest pain, palpitations Respiratory:  No shortness of breath at rest or with exertion.   No wheezes GastrointestinaI: No nausea, vomiting, diarrhea, abdominal pain, fecal incontinence Genitourinary:  No dysuria, urinary retention or frequency.  No  nocturia. Musculoskeletal:  No neck pain, back pain Integumentary: No rash, pruritus, skin lesions Neurological: as above Psychiatric: No depression at this time.  No anxiety Endocrine: No palpitations, diaphoresis, change in appetite, change in weigh or increased thirst Hematologic/Lymphatic:  No anemia, purpura, petechiae. Allergic/Immunologic: No itchy/runny eyes, nasal congestion, recent allergic reactions, rashes  ALLERGIES: Allergies  Allergen Reactions   Gadopiclenol  Nausea And Vomiting   Iodinated Contrast Media Nausea And Vomiting    HOME MEDICATIONS:  Current Outpatient Medications:    acetaminophen  (TYLENOL ) 500 MG tablet, Take 1,000 mg by mouth as needed for mild pain (pain score 1-3) or moderate pain (pain score 4-6)., Disp: , Rfl:    albuterol  (VENTOLIN  HFA) 108 (90 Base) MCG/ACT inhaler, Inhale 2 puffs into the lungs every 6 (six) hours as needed  for wheezing or shortness of breath., Disp: 1 each, Rfl: 0   amoxicillin -clavulanate (AUGMENTIN ) 875-125 MG tablet, Take 1 tablet by mouth 2 (two) times daily., Disp: 3 tablet, Rfl: 0   ascorbic acid  (VITAMIN C ) 500 MG tablet, Take 500 mg by mouth 2 (two) times daily., Disp: , Rfl:    azithromycin  (ZITHROMAX ) 250 MG tablet, Take 1 tablet (250 mg total) by mouth daily., Disp: 1 tablet, Rfl: 0   baclofen  (LIORESAL ) 10 MG tablet, Take 10 mg by mouth daily as needed for muscle spasms., Disp: , Rfl:    bisacodyl  (DULCOLAX) 5 MG EC tablet, Take 2 tablets (10 mg total) by mouth daily as needed for moderate constipation., Disp: 30 tablet, Rfl: 0   Calcium Carbonate (CALCIUM 600 PO), Take 1 capsule by mouth daily., Disp: , Rfl:    Carboxymethylcellulose Sodium (THERATEARS OP), Place 1 drop into both eyes 4 (four) times daily as needed (dry eye)., Disp: , Rfl:    cholecalciferol  (VITAMIN D3) 25 MCG (1000 UNIT) tablet, Take 1,000 Units by mouth at bedtime., Disp: , Rfl:    cyanocobalamin  (VITAMIN B12) 500 MCG tablet, Take 500 mcg by mouth  daily., Disp: , Rfl:    escitalopram  (LEXAPRO ) 10 MG tablet, Take 1 tablet (10 mg total) by mouth daily., Disp: 90 tablet, Rfl: 1   ibuprofen  (ADVIL ) 200 MG tablet, Take 600 mg by mouth as needed for headache or moderate pain., Disp: , Rfl:    ketoconazole  (NIZORAL ) 2 % shampoo, Apply 1 Application topically See admin instructions. SHAMPOO AS DIRECTED AFFECTED AREA THREE TIMES A WEEK TO THE SCALP IN THE SHOWER LEAVE ON FOR 5 MINUTES THEN RINSE OFF. TO THE SCALP IN THE SHOWER LEAVE ON FOR 5 MINUTES THEN RINSE OFF. Every 4 days, Disp: , Rfl:    lamoTRIgine  (LAMICTAL ) 100 MG tablet, Take 100 mg by mouth in the morning, 100 mg at lunch and 200 mg by mouth at bedtime. (Patient taking differently: Take 100 mg by mouth 4 (four) times daily. Take one tablet by mouth in the morning, and then take one tablet by mouth at lunch and then take two tablets by mouth at bedtime.), Disp: 360 tablet, Rfl: 3   linaclotide  (LINZESS ) 145 MCG CAPS capsule, Take 1 capsule (145 mcg total) by mouth daily before breakfast., Disp: 30 capsule, Rfl: 0   methenamine (MANDELAMINE) 1 g tablet, Take 1,000 mg by mouth 2 (two) times daily. Only takes this medication when not on an antibiotic., Disp: , Rfl:    modafinil  (PROVIGIL ) 100 MG tablet, Take 1 tablet (100 mg total) by mouth every morning., Disp: 90 tablet, Rfl: 1   nystatin cream (MYCOSTATIN), Apply 1 Application topically daily as needed for dry skin., Disp: , Rfl:    ocrelizumab 600 mg in sodium chloride  0.9 % 500 mL, Inject 600 mg into the vein every 6 (six) months. , Disp: , Rfl:    oxyCODONE -acetaminophen  (PERCOCET) 5-325 MG tablet, Take 1 tablet by mouth every 12 (twelve) hours as needed for severe pain (pain score 7-10)., Disp: 60 tablet, Rfl: 0   polyethylene glycol powder (GLYCOLAX /MIRALAX ) 17 GM/SCOOP powder, Dissolve 1 capful (17g) in 4-8 ounces of liquid and take by mouth daily as needed, Disp: 238 g, Rfl: 0   pregabalin  (LYRICA ) 150 MG capsule, Take 1 capsule (150 mg  total) by mouth 3 (three) times daily., Disp: 90 capsule, Rfl: 5   pregabalin  (LYRICA ) 150 MG capsule, Take 1 capsule (150 mg total) by mouth 3 (three) times daily.,  Disp: 90 capsule, Rfl: 5   senna-docusate (SENOKOT-S) 8.6-50 MG tablet, Take 1 tablet by mouth at bedtime as needed for mild constipation., Disp: 60 tablet, Rfl: 0  PAST MEDICAL HISTORY: Past Medical History:  Diagnosis Date   Abnormality of gait 11/21/2015   Asthma    childhood asthma   Classic migraine    Depression    Dysphagia    Hay fever    Headache syndrome 12/22/2018   MS (multiple sclerosis)    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   Trigeminal neuralgia of right side of face     PAST SURGICAL HISTORY: Past Surgical History:  Procedure Laterality Date   eye surgeries     x 2; bilateral 74 and 76   EYE SURGERY      FAMILY HISTORY: Family History  Problem Relation Age of Onset   Cancer Father    Multiple sclerosis Sister    Seizures Maternal Uncle    Parkinsonism Maternal Uncle    Multiple sclerosis Sister    Multiple sclerosis Paternal Uncle    Multiple sclerosis Other    Lung cancer Other        parent   Uterine cancer Other        other    SOCIAL HISTORY:  Social History   Socioeconomic History   Marital status: Married    Spouse name: joy   Number of children: 2   Years of education: college   Highest education level: Not on file  Occupational History   Occupation: disabled  Tobacco Use   Smoking status: Former    Current packs/day: 0.50    Types: Cigarettes    Passive exposure: Past   Smokeless tobacco: Never  Vaping Use   Vaping status: Never Used  Substance and Sexual Activity   Alcohol  use: No    Comment: rare    Drug use: Yes    Types: Oxycodone    Sexual activity: Not Currently    Comment: disease progress  Other Topics Concern   Not on file  Social History Narrative   Lives at home, married   Unable to use right hand   Patient drinks 1 cup caffeine daily.   Social  Drivers of Corporate investment banker Strain: Not on file  Food Insecurity: No Food Insecurity (06/25/2024)   Hunger Vital Sign    Worried About Running Out of Food in the Last Year: Never true    Ran Out of Food in the Last Year: Never true  Transportation Needs: No Transportation Needs (06/25/2024)   PRAPARE - Administrator, Civil Service (Medical): No    Lack of Transportation (Non-Medical): No  Physical Activity: Not on file  Stress: Not on file  Social Connections: Unknown (07/07/2022)   Received from [facility]   Social Network    Social Network: Not on file  Intimate Partner Violence: Not At Risk (06/25/2024)   Humiliation, Afraid, Rape, and Kick questionnaire    Fear of Current or Ex-Partner: No    Emotionally Abused: No    Physically Abused: No    Sexually Abused: No     PHYSICAL EXAM  There were no vitals filed for this visit.    There is no height or weight on file to calculate BMI.   General: The patient is well-developed and well-nourished and in no acute distress.  He is in a wheelchair.  HEENT:  Head is [place]/AT.  Sclera are anicteric.    Skin: Extremities are without rash or  edema.  Musculoskeletal:  Back is nontender  Neurologic Exam  Mental status: The patient is alert and oriented x 3 at the time of the examination. The patient has apparent normal recent and remote memory, with an apparently normal attention span and concentration ability.   Speech is soft.  Cranial nerves: Extraocular movements show bilateral INO right greater than left and also some nystagmus on upgaze.  No nystagmus with primary gaze.  Reduced visual acuity OD.  Facial symmetry is present.  Facial strength appears symmetric.  Facial sensation seem more symmetric today.  Trapezius and sternocleidomastoid strength is normal.  Mild dysarthria is noted.   No obvious hearing deficits are noted.  Motor:  Muscle bulk is normal.   Tone is increased legs > arms; right > left .  Strength is  2/5 in left arm grip but 2-/5 in the shoulder, 1/5 right arm, 0/5 in legs.   Sensory: He is unable to feel touch or vibration in the legs.  Gait and station: Wheelchair bound  Reflexes: Deep tendon reflexes are symmetric and increased in legs with sustained ankle clonus.   Plantar responses are extensor .    DIAGNOSTIC DATA (LABS, IMAGING, TESTING) - I reviewed patient records, labs, notes, testing and imaging myself where available.  Lab Results  Component Value Date   WBC 7.0 06/29/2024   HGB 13.6 06/29/2024   HCT 42.8 06/29/2024   MCV 92.2 06/29/2024   PLT 212 06/29/2024      Component Value Date/Time   NA 145 06/29/2024 0155   NA 141 06/15/2023 1526   K 3.9 06/29/2024 0155   CL 106 06/29/2024 0155   CO2 27 06/29/2024 0155   GLUCOSE 99 06/29/2024 0155   BUN 19 06/29/2024 0155   BUN 15 06/15/2023 1526   CREATININE 0.64 06/29/2024 0155   CALCIUM 9.0 06/29/2024 0155   PROT 6.1 (L) 06/25/2024 0918   PROT 5.8 (L) 06/15/2023 1526   ALBUMIN 3.7 06/25/2024 0918   ALBUMIN 3.9 06/15/2023 1526   AST 11 (L) 06/25/2024 0918   ALT 8 06/25/2024 0918   ALKPHOS 98 06/25/2024 0918   BILITOT 1.2 06/25/2024 0918   BILITOT 0.3 06/15/2023 1526   GFRNONAA >60 06/29/2024 0155   GFRAA 84 07/29/2020 1429   Lab Results  Component Value Date   CHOL 234 (H) 08/14/2015   HDL 47.80 08/14/2015   LDLCALC 163 (H) 08/14/2015   LDLDIRECT 192.1 08/04/2012   TRIG 116.0 08/14/2015   CHOLHDL 5 08/14/2015   Lab Results  Component Value Date   HGBA1C 5.4 08/14/2015   Lab Results  Component Value Date   VITAMINB12 603 06/15/2023   Lab Results  Component Value Date   TSH 0.797 06/25/2024       ASSESSMENT AND PLAN  No diagnosis found.   He has right trigeminal neuralgia that is currently well-controlled on lamotrigine , baclofen  and Lyrica .  Continue lamotrigine  100/100/200 but can increase to 200 mg po tid   continue baclofen  and Lyrica  150 mg twice a day but can go up  to 600 mg/day Ocrevus 600 mg q 6 months,  check labs.  We discussed that Ocrevus is excellent at reducing relapses and new lesions on MRI but that secondary progressive clinical changes and MS are not helped as much.  I did discuss that there are some medications in clinical trials and hopefully 1 more then will get approved.  If so, I would consider switching him from Ocrevus to that medication (as an example tolebrutinib,  other BTKi or frexalimab) Continue iron and B12.    Continue Wound care - sees again later in week They will return to see me in 6 months or sooner if there are new or worsening neurologic symptoms.  42-minute office visit with the majority of the time spent face-to-face for history and physical, discussion/counseling and decision-making.  Additional time with record review and documentation.  This visit is part of a comprehensive longitudinal care medical relationship regarding the patients primary diagnosis of MS and related concerns.   Marquail Bradwell A. Vear, MD, [facility] 07/26/2024, 8:04 PM Certified in Neurology, Clinical Neurophysiology, Sleep Medicine and Neuroimaging  [facility] [address] [place], [place] [address] [phone]

## 2024-07-27 ENCOUNTER — Ambulatory Visit (INDEPENDENT_AMBULATORY_CARE_PROVIDER_SITE_OTHER): Admitting: Neurology

## 2024-07-27 ENCOUNTER — Encounter: Payer: Self-pay | Admitting: Neurology

## 2024-07-27 ENCOUNTER — Telehealth: Payer: Self-pay | Admitting: *Deleted

## 2024-07-27 VITALS — BP 121/80 | HR 53 | Ht 70.0 in

## 2024-07-27 DIAGNOSIS — N319 Neuromuscular dysfunction of bladder, unspecified: Secondary | ICD-10-CM | POA: Diagnosis not present

## 2024-07-27 DIAGNOSIS — G800 Spastic quadriplegic cerebral palsy: Secondary | ICD-10-CM | POA: Diagnosis not present

## 2024-07-27 DIAGNOSIS — G35C1 Active secondary progressive multiple sclerosis: Secondary | ICD-10-CM

## 2024-07-27 DIAGNOSIS — Z79899 Other long term (current) drug therapy: Secondary | ICD-10-CM | POA: Diagnosis not present

## 2024-07-27 DIAGNOSIS — S31000S Unspecified open wound of lower back and pelvis without penetration into retroperitoneum, sequela: Secondary | ICD-10-CM

## 2024-07-27 DIAGNOSIS — G500 Trigeminal neuralgia: Secondary | ICD-10-CM

## 2024-07-27 MED ORDER — TERIFLUNOMIDE 14 MG PO TABS: ORAL_TABLET | ORAL | 3 refills | Status: AC

## 2024-07-27 NOTE — Telephone Encounter (Signed)
 Pt stopping Ocrevus and switching to Teriflunomide. Wants him to start 10/2024.  Dr. Vear notified Kim/[facility] that pt stopping Ocrevus.  Josette- Dr Vear sent rx to [facility] pharmacy

## 2024-07-30 LAB — QUANTIFERON-TB GOLD PLUS
QuantiFERON Mitogen Value: >10 [IU]/mL
QuantiFERON Nil Value: 0.03 [IU]/mL
QuantiFERON TB1 Ag Value: 0.18 [IU]/mL
QuantiFERON TB2 Ag Value: 0.12 [IU]/mL

## 2024-07-30 LAB — IGG, IGA, IGM
IgA/Immunoglobulin A, Serum: 146 mg/dL (ref 61–437)
IgG (Immunoglobin G), Serum: 615 mg/dL (ref 603–1613)
IgM (Immunoglobulin M), Srm: 33 mg/dL (ref 20–172)

## 2024-07-31 ENCOUNTER — Ambulatory Visit: Payer: Self-pay | Admitting: Neurology

## 2024-07-31 ENCOUNTER — Encounter: Payer: Self-pay | Admitting: Neurology

## 2024-07-31 ENCOUNTER — Other Ambulatory Visit: Payer: Self-pay | Admitting: Neurology

## 2024-07-31 DIAGNOSIS — G35D Multiple sclerosis, unspecified: Secondary | ICD-10-CM

## 2024-07-31 DIAGNOSIS — G500 Trigeminal neuralgia: Secondary | ICD-10-CM

## 2024-08-03 ENCOUNTER — Encounter: Payer: Self-pay | Admitting: Neurology

## 2024-08-03 ENCOUNTER — Telehealth: Payer: Self-pay | Admitting: Neurology

## 2024-08-03 DIAGNOSIS — G500 Trigeminal neuralgia: Secondary | ICD-10-CM

## 2024-08-03 DIAGNOSIS — G35D Multiple sclerosis, unspecified: Secondary | ICD-10-CM

## 2024-08-03 NOTE — Telephone Encounter (Signed)
 Referral to Neurosurgery faxed to [facility]   [facility]  Phone: [phone] Fax: [phone]  Referral was also emailed to [facility]

## 2024-08-18 ENCOUNTER — Encounter (HOSPITAL_BASED_OUTPATIENT_CLINIC_OR_DEPARTMENT_OTHER): Attending: Internal Medicine | Admitting: Internal Medicine

## 2024-08-18 DIAGNOSIS — L89324 Pressure ulcer of left buttock, stage 4: Secondary | ICD-10-CM

## 2024-08-18 DIAGNOSIS — L89314 Pressure ulcer of right buttock, stage 4: Secondary | ICD-10-CM

## 2024-08-18 DIAGNOSIS — E440 Moderate protein-calorie malnutrition: Secondary | ICD-10-CM

## 2024-08-24 ENCOUNTER — Encounter: Payer: Self-pay | Admitting: Neurology

## 2024-08-24 ENCOUNTER — Other Ambulatory Visit: Payer: Self-pay | Admitting: *Deleted

## 2024-08-24 ENCOUNTER — Other Ambulatory Visit: Payer: Self-pay | Admitting: Neurology

## 2024-08-24 MED ORDER — ESCITALOPRAM OXALATE 10 MG PO TABS: 10.0000 mg | ORAL_TABLET | Freq: Every day | ORAL | 3 refills | Status: DC

## 2024-08-24 NOTE — Telephone Encounter (Signed)
Radiation Oncology referral placed.

## 2024-08-24 NOTE — Telephone Encounter (Signed)
 Amy from [facility]  called stated that  Pt referral was sent and  they received  approval from [place]. However  [facility]  is requesting referral order for Oncology and radiology for Pt to be sent to [facility] as well  Phone : [phone]

## 2024-08-28 ENCOUNTER — Other Ambulatory Visit: Payer: Self-pay | Admitting: *Deleted

## 2024-08-28 MED ORDER — ESCITALOPRAM OXALATE 10 MG PO TABS: 10.0000 mg | ORAL_TABLET | Freq: Every day | ORAL | 3 refills | Status: AC

## 2024-08-29 ENCOUNTER — Telehealth: Payer: Self-pay | Admitting: Neurology

## 2024-08-29 NOTE — Telephone Encounter (Signed)
 Referral to Radiation Oncology faxed to [facility] Neuro[facility]   and Sent thru email and faxed to [facility] Phone:[phone] Fax:[phone]  [name] [facility]  Phone :[phone] Fax: [phone]   Sent thru email as well to [facility]

## 2024-08-29 NOTE — Telephone Encounter (Signed)
(  Done) Referral to Radiation Oncology faxed to [facility]surgery   and Sent thru email and faxed to [facility] Neurosurgery [phone] Fax:[phone]  LORAYNE Peter [facility]  Phone :[phone] Fax: [phone]   Sent thru email as well to [facility]

## 2024-09-15 ENCOUNTER — Encounter (HOSPITAL_BASED_OUTPATIENT_CLINIC_OR_DEPARTMENT_OTHER): Attending: Internal Medicine | Admitting: Internal Medicine

## 2024-09-15 DIAGNOSIS — G8250 Quadriplegia, unspecified: Secondary | ICD-10-CM | POA: Diagnosis not present

## 2024-09-15 DIAGNOSIS — N319 Neuromuscular dysfunction of bladder, unspecified: Secondary | ICD-10-CM | POA: Diagnosis not present

## 2024-09-15 DIAGNOSIS — E440 Moderate protein-calorie malnutrition: Secondary | ICD-10-CM | POA: Insufficient documentation

## 2024-09-15 DIAGNOSIS — G35D Multiple sclerosis, unspecified: Secondary | ICD-10-CM | POA: Diagnosis not present

## 2024-09-15 DIAGNOSIS — L89324 Pressure ulcer of left buttock, stage 4: Secondary | ICD-10-CM | POA: Diagnosis not present

## 2024-09-15 DIAGNOSIS — L89314 Pressure ulcer of right buttock, stage 4: Secondary | ICD-10-CM | POA: Diagnosis present

## 2024-09-27 ENCOUNTER — Encounter: Payer: Self-pay | Admitting: Neurology

## 2024-09-28 ENCOUNTER — Other Ambulatory Visit: Payer: Self-pay | Admitting: Neurology

## 2024-09-28 DIAGNOSIS — Z79899 Other long term (current) drug therapy: Secondary | ICD-10-CM

## 2024-09-28 DIAGNOSIS — G35C1 Active secondary progressive multiple sclerosis: Secondary | ICD-10-CM

## 2024-10-23 ENCOUNTER — Encounter (HOSPITAL_BASED_OUTPATIENT_CLINIC_OR_DEPARTMENT_OTHER): Admitting: Internal Medicine

## 2024-11-01 NOTE — Telephone Encounter (Signed)
 Called and spoke to wife and stated to stop modafinil  based on interaction with jourvanax. However pt wife stated that its cost prohibitive and is not sure if the jourvanax will be renewed. If she stops the jourvanax can he stay on modafinil ?  She stated that neurosurgery Dr. Candyce and Dr. Tereso stated that modafinil  and jourvanax worked well together. She is fine with a mychart message response verses call

## 2024-11-09 NOTE — Telephone Encounter (Signed)
 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 Full note placed in Dr Duncan office for review.

## 2024-11-14 ENCOUNTER — Encounter (HOSPITAL_BASED_OUTPATIENT_CLINIC_OR_DEPARTMENT_OTHER): Admitting: Internal Medicine

## 2024-11-14 DIAGNOSIS — L89324 Pressure ulcer of left buttock, stage 4: Secondary | ICD-10-CM

## 2024-11-14 DIAGNOSIS — L89314 Pressure ulcer of right buttock, stage 4: Secondary | ICD-10-CM | POA: Diagnosis not present

## 2024-12-19 ENCOUNTER — Encounter (HOSPITAL_BASED_OUTPATIENT_CLINIC_OR_DEPARTMENT_OTHER): Admitting: Internal Medicine

## 2025-02-26 ENCOUNTER — Ambulatory Visit: Admitting: Neurology
